# Patient Record
Sex: Female | Born: 1940
Health system: Southern US, Community
[De-identification: ages and names within clinical notes are randomized; demographics above are authoritative.]

## PROBLEM LIST (undated history)

## (undated) DIAGNOSIS — I10 Essential (primary) hypertension: Secondary | ICD-10-CM

## (undated) DIAGNOSIS — J381 Polyp of vocal cord and larynx: Secondary | ICD-10-CM

## (undated) DIAGNOSIS — C50919 Malignant neoplasm of unspecified site of unspecified female breast: Secondary | ICD-10-CM

## (undated) DIAGNOSIS — I509 Heart failure, unspecified: Secondary | ICD-10-CM

## (undated) DIAGNOSIS — Z9981 Dependence on supplemental oxygen: Secondary | ICD-10-CM

## (undated) DIAGNOSIS — G629 Polyneuropathy, unspecified: Secondary | ICD-10-CM

## (undated) DIAGNOSIS — R42 Dizziness and giddiness: Secondary | ICD-10-CM

## (undated) DIAGNOSIS — J449 Chronic obstructive pulmonary disease, unspecified: Secondary | ICD-10-CM

## (undated) DIAGNOSIS — R260 Ataxic gait: Secondary | ICD-10-CM

## (undated) DIAGNOSIS — J45909 Unspecified asthma, uncomplicated: Secondary | ICD-10-CM

## (undated) DIAGNOSIS — J329 Chronic sinusitis, unspecified: Secondary | ICD-10-CM

## (undated) DIAGNOSIS — R2689 Other abnormalities of gait and mobility: Secondary | ICD-10-CM

## (undated) DIAGNOSIS — C801 Malignant (primary) neoplasm, unspecified: Secondary | ICD-10-CM

## (undated) DIAGNOSIS — M797 Fibromyalgia: Secondary | ICD-10-CM

## (undated) DIAGNOSIS — N390 Urinary tract infection, site not specified: Secondary | ICD-10-CM

## (undated) DIAGNOSIS — M069 Rheumatoid arthritis, unspecified: Secondary | ICD-10-CM

## (undated) DIAGNOSIS — Z87891 Personal history of nicotine dependence: Principal | ICD-10-CM

## (undated) HISTORY — PX: EYE MUSCLE SURGERY: SHX370

## (undated) HISTORY — PX: ABDOMINAL HYSTERECTOMY: SHX81

## (undated) HISTORY — DX: Personal history of nicotine dependence: Z87.891

## (undated) HISTORY — PX: THUMB ARTHROSCOPY: SHX2509

## (undated) SURGERY — BRONCHOSCOPY, WITH FLUOROSCOPY
Anesthesia: Moderate Sedation | Laterality: Left

---

## 2004-02-08 ENCOUNTER — Ambulatory Visit: Payer: Self-pay | Admitting: Psychiatry

## 2004-05-08 ENCOUNTER — Ambulatory Visit: Payer: Self-pay | Admitting: Psychiatry

## 2005-03-03 ENCOUNTER — Other Ambulatory Visit: Payer: Self-pay

## 2005-03-03 ENCOUNTER — Ambulatory Visit: Payer: Self-pay | Admitting: Radiation Oncology

## 2005-03-05 ENCOUNTER — Ambulatory Visit: Payer: Self-pay | Admitting: General Surgery

## 2005-03-20 ENCOUNTER — Ambulatory Visit: Payer: Self-pay | Admitting: Oncology

## 2005-04-07 ENCOUNTER — Other Ambulatory Visit: Payer: Self-pay

## 2005-04-13 ENCOUNTER — Ambulatory Visit: Payer: Self-pay | Admitting: Oncology

## 2005-05-13 ENCOUNTER — Ambulatory Visit: Payer: Self-pay | Admitting: Oncology

## 2005-05-31 ENCOUNTER — Emergency Department: Payer: Self-pay | Admitting: Unknown Physician Specialty

## 2005-06-13 ENCOUNTER — Ambulatory Visit: Payer: Self-pay | Admitting: Oncology

## 2005-06-30 ENCOUNTER — Inpatient Hospital Stay: Payer: Self-pay | Admitting: Oncology

## 2005-07-13 ENCOUNTER — Ambulatory Visit: Payer: Self-pay | Admitting: Oncology

## 2005-08-13 ENCOUNTER — Ambulatory Visit: Payer: Self-pay | Admitting: Oncology

## 2005-09-13 ENCOUNTER — Ambulatory Visit: Payer: Self-pay | Admitting: Oncology

## 2005-10-24 ENCOUNTER — Ambulatory Visit: Payer: Self-pay | Admitting: Oncology

## 2005-11-13 ENCOUNTER — Ambulatory Visit: Payer: Self-pay | Admitting: Oncology

## 2006-01-22 ENCOUNTER — Ambulatory Visit: Payer: Self-pay | Admitting: Oncology

## 2006-02-13 ENCOUNTER — Ambulatory Visit: Payer: Self-pay | Admitting: Oncology

## 2006-04-14 ENCOUNTER — Ambulatory Visit: Payer: Self-pay | Admitting: Oncology

## 2006-04-22 ENCOUNTER — Ambulatory Visit: Payer: Self-pay | Admitting: Oncology

## 2006-05-14 ENCOUNTER — Ambulatory Visit: Payer: Self-pay | Admitting: Oncology

## 2006-07-14 ENCOUNTER — Ambulatory Visit: Payer: Self-pay | Admitting: Oncology

## 2006-07-30 ENCOUNTER — Ambulatory Visit: Payer: Self-pay | Admitting: Oncology

## 2006-08-14 ENCOUNTER — Ambulatory Visit: Payer: Self-pay | Admitting: Oncology

## 2006-10-14 ENCOUNTER — Ambulatory Visit: Payer: Self-pay | Admitting: Oncology

## 2006-11-05 ENCOUNTER — Ambulatory Visit: Payer: Self-pay | Admitting: Oncology

## 2006-11-14 ENCOUNTER — Ambulatory Visit: Payer: Self-pay | Admitting: Oncology

## 2007-01-14 ENCOUNTER — Ambulatory Visit: Payer: Self-pay | Admitting: Oncology

## 2007-01-14 DIAGNOSIS — C50919 Malignant neoplasm of unspecified site of unspecified female breast: Secondary | ICD-10-CM

## 2007-01-14 HISTORY — PX: BREAST LUMPECTOMY: SHX2

## 2007-01-14 HISTORY — DX: Malignant neoplasm of unspecified site of unspecified female breast: C50.919

## 2007-02-04 ENCOUNTER — Ambulatory Visit: Payer: Self-pay | Admitting: Oncology

## 2007-02-14 ENCOUNTER — Ambulatory Visit: Payer: Self-pay | Admitting: Oncology

## 2007-03-14 ENCOUNTER — Ambulatory Visit: Payer: Self-pay | Admitting: Oncology

## 2007-04-14 ENCOUNTER — Ambulatory Visit: Payer: Self-pay | Admitting: Oncology

## 2007-05-06 ENCOUNTER — Ambulatory Visit: Payer: Self-pay | Admitting: Oncology

## 2007-05-14 ENCOUNTER — Ambulatory Visit: Payer: Self-pay | Admitting: Oncology

## 2007-05-26 ENCOUNTER — Emergency Department: Payer: Self-pay | Admitting: Emergency Medicine

## 2007-06-10 ENCOUNTER — Encounter: Admission: RE | Admit: 2007-06-10 | Discharge: 2007-06-10 | Payer: Self-pay | Admitting: Neurosurgery

## 2007-06-28 ENCOUNTER — Ambulatory Visit: Payer: Self-pay | Admitting: Otolaryngology

## 2007-07-13 ENCOUNTER — Ambulatory Visit: Payer: Self-pay | Admitting: Otolaryngology

## 2007-07-21 ENCOUNTER — Ambulatory Visit: Payer: Self-pay | Admitting: Otolaryngology

## 2007-08-14 ENCOUNTER — Ambulatory Visit: Payer: Self-pay | Admitting: Oncology

## 2007-08-17 ENCOUNTER — Ambulatory Visit: Payer: Self-pay | Admitting: Neurosurgery

## 2007-09-06 ENCOUNTER — Ambulatory Visit: Payer: Self-pay | Admitting: Oncology

## 2007-09-14 ENCOUNTER — Ambulatory Visit: Payer: Self-pay | Admitting: Oncology

## 2007-09-24 ENCOUNTER — Ambulatory Visit: Payer: Self-pay | Admitting: General Surgery

## 2007-10-07 ENCOUNTER — Ambulatory Visit: Payer: Self-pay | Admitting: Otolaryngology

## 2007-10-14 ENCOUNTER — Ambulatory Visit: Payer: Self-pay | Admitting: Otolaryngology

## 2007-12-14 ENCOUNTER — Ambulatory Visit: Payer: Self-pay | Admitting: Oncology

## 2007-12-24 ENCOUNTER — Ambulatory Visit: Payer: Self-pay | Admitting: Oncology

## 2008-01-14 ENCOUNTER — Ambulatory Visit: Payer: Self-pay | Admitting: Oncology

## 2008-05-10 ENCOUNTER — Ambulatory Visit: Payer: Self-pay | Admitting: Internal Medicine

## 2008-05-15 ENCOUNTER — Ambulatory Visit: Payer: Self-pay | Admitting: Internal Medicine

## 2008-06-13 ENCOUNTER — Ambulatory Visit: Payer: Self-pay | Admitting: Oncology

## 2008-06-28 ENCOUNTER — Ambulatory Visit: Payer: Self-pay | Admitting: Oncology

## 2008-07-13 ENCOUNTER — Ambulatory Visit: Payer: Self-pay | Admitting: Oncology

## 2008-12-18 ENCOUNTER — Ambulatory Visit: Payer: Self-pay | Admitting: Unknown Physician Specialty

## 2009-01-13 ENCOUNTER — Ambulatory Visit: Payer: Self-pay | Admitting: Oncology

## 2009-02-05 ENCOUNTER — Ambulatory Visit: Payer: Self-pay | Admitting: Oncology

## 2009-02-05 ENCOUNTER — Ambulatory Visit: Payer: Self-pay | Admitting: General Surgery

## 2009-02-13 ENCOUNTER — Ambulatory Visit: Payer: Self-pay | Admitting: Oncology

## 2009-06-16 ENCOUNTER — Emergency Department: Payer: Self-pay | Admitting: Emergency Medicine

## 2009-06-17 ENCOUNTER — Emergency Department: Payer: Self-pay | Admitting: Emergency Medicine

## 2009-07-13 ENCOUNTER — Ambulatory Visit: Payer: Self-pay | Admitting: Oncology

## 2009-08-10 ENCOUNTER — Ambulatory Visit: Payer: Self-pay | Admitting: Oncology

## 2009-08-12 LAB — CANCER ANTIGEN 27.29: CA 27.29: 31.3 U/mL (ref 0.0–38.6)

## 2009-08-13 ENCOUNTER — Ambulatory Visit: Payer: Self-pay | Admitting: Oncology

## 2010-02-08 ENCOUNTER — Ambulatory Visit: Payer: Self-pay | Admitting: Oncology

## 2010-02-09 LAB — CANCER ANTIGEN 27.29: CA 27.29: 34.9 U/mL (ref 0.0–38.6)

## 2010-02-13 ENCOUNTER — Ambulatory Visit: Payer: Self-pay | Admitting: Oncology

## 2010-07-15 ENCOUNTER — Ambulatory Visit: Payer: Self-pay | Admitting: Internal Medicine

## 2010-08-09 ENCOUNTER — Ambulatory Visit: Payer: Self-pay | Admitting: Oncology

## 2010-08-14 ENCOUNTER — Ambulatory Visit: Payer: Self-pay | Admitting: Oncology

## 2011-02-06 ENCOUNTER — Ambulatory Visit: Payer: Self-pay | Admitting: Oncology

## 2011-02-06 LAB — CBC CANCER CENTER
Basophil %: 1.5 %
HGB: 14 g/dL (ref 12.0–16.0)
MCH: 31.7 pg (ref 26.0–34.0)
MCHC: 33.9 g/dL (ref 32.0–36.0)
Monocyte #: 0.3 x10 3/mm (ref 0.0–0.7)
Neutrophil #: 3 x10 3/mm (ref 1.4–6.5)
Neutrophil %: 59.9 %

## 2011-02-06 LAB — COMPREHENSIVE METABOLIC PANEL
Anion Gap: 9 (ref 7–16)
BUN: 5 mg/dL — ABNORMAL LOW (ref 7–18)
Bilirubin,Total: 0.3 mg/dL (ref 0.2–1.0)
Chloride: 99 mmol/L (ref 98–107)
Co2: 29 mmol/L (ref 21–32)
Creatinine: 0.74 mg/dL (ref 0.60–1.30)
EGFR (African American): >60
Potassium: 4 mmol/L (ref 3.5–5.1)
SGPT (ALT): 18 U/L
Sodium: 137 mmol/L (ref 136–145)
Total Protein: 8.2 g/dL (ref 6.4–8.2)

## 2011-02-07 LAB — CANCER ANTIGEN 27.29: CA 27.29: 32.3 U/mL (ref 0.0–38.6)

## 2011-02-14 ENCOUNTER — Ambulatory Visit: Payer: Self-pay | Admitting: Oncology

## 2011-04-28 ENCOUNTER — Ambulatory Visit: Payer: Self-pay | Admitting: Rheumatology

## 2011-06-17 DIAGNOSIS — M1990 Unspecified osteoarthritis, unspecified site: Secondary | ICD-10-CM | POA: Insufficient documentation

## 2011-08-04 ENCOUNTER — Ambulatory Visit: Payer: Self-pay | Admitting: Oncology

## 2011-08-04 LAB — COMPREHENSIVE METABOLIC PANEL
Albumin: 3.7 g/dL (ref 3.4–5.0)
Alkaline Phosphatase: 102 U/L (ref 50–136)
Bilirubin,Total: 0.2 mg/dL (ref 0.2–1.0)
Co2: 30 mmol/L (ref 21–32)
EGFR (Non-African Amer.): >60
Glucose: 102 mg/dL — ABNORMAL HIGH (ref 65–99)
Osmolality: 270 (ref 275–301)
SGPT (ALT): 16 U/L
Sodium: 136 mmol/L (ref 136–145)

## 2011-08-04 LAB — CBC CANCER CENTER
Eosinophil %: 4.2 %
HCT: 41.6 % (ref 35.0–47.0)
Lymphocyte #: 1.8 x10 3/mm (ref 1.0–3.6)
Lymphocyte %: 35.4 %
MCV: 94 fL (ref 80–100)
Monocyte %: 6.7 %
Platelet: 168 x10 3/mm (ref 150–440)
RBC: 4.42 10*6/uL (ref 3.80–5.20)
WBC: 5 x10 3/mm (ref 3.6–11.0)

## 2011-08-05 LAB — CANCER ANTIGEN 27.29: CA 27.29: 33.1 U/mL (ref 0.0–38.6)

## 2011-08-14 ENCOUNTER — Ambulatory Visit: Payer: Self-pay | Admitting: Oncology

## 2011-11-03 DIAGNOSIS — J45909 Unspecified asthma, uncomplicated: Secondary | ICD-10-CM | POA: Insufficient documentation

## 2011-11-03 DIAGNOSIS — C50919 Malignant neoplasm of unspecified site of unspecified female breast: Secondary | ICD-10-CM | POA: Insufficient documentation

## 2011-11-03 DIAGNOSIS — M1990 Unspecified osteoarthritis, unspecified site: Secondary | ICD-10-CM | POA: Insufficient documentation

## 2011-11-18 ENCOUNTER — Ambulatory Visit: Payer: Self-pay | Admitting: Specialist

## 2011-11-26 ENCOUNTER — Ambulatory Visit: Payer: Self-pay | Admitting: Specialist

## 2012-01-14 ENCOUNTER — Ambulatory Visit: Payer: Self-pay | Admitting: Oncology

## 2012-02-12 ENCOUNTER — Ambulatory Visit: Payer: Self-pay | Admitting: Oncology

## 2012-02-14 ENCOUNTER — Ambulatory Visit: Payer: Self-pay | Admitting: Oncology

## 2012-02-16 ENCOUNTER — Ambulatory Visit: Payer: Self-pay

## 2012-02-16 LAB — CBC CANCER CENTER
Lymphocyte %: 30.8 %
MCH: 31.4 pg (ref 26.0–34.0)
MCHC: 34.3 g/dL (ref 32.0–36.0)
Monocyte #: 0.3 x10 3/mm (ref 0.2–0.9)
Monocyte %: 6.3 %
RBC: 3.99 10*6/uL (ref 3.80–5.20)
RDW: 13.4 % (ref 11.5–14.5)
WBC: 5.3 x10 3/mm (ref 3.6–11.0)

## 2012-02-16 LAB — COMPREHENSIVE METABOLIC PANEL
Alkaline Phosphatase: 115 U/L (ref 50–136)
Bilirubin,Total: 0.2 mg/dL (ref 0.2–1.0)
EGFR (Non-African Amer.): >60
Glucose: 143 mg/dL — ABNORMAL HIGH (ref 65–99)
Osmolality: 273 (ref 275–301)
Potassium: 4 mmol/L (ref 3.5–5.1)
SGOT(AST): 22 U/L (ref 15–37)
Total Protein: 7.3 g/dL (ref 6.4–8.2)

## 2012-02-17 LAB — CANCER ANTIGEN 27.29: CA 27.29: 26.6 U/mL (ref 0.0–38.6)

## 2012-03-13 ENCOUNTER — Ambulatory Visit: Payer: Self-pay | Admitting: Oncology

## 2012-05-11 DIAGNOSIS — M545 Low back pain, unspecified: Secondary | ICD-10-CM | POA: Insufficient documentation

## 2012-05-11 DIAGNOSIS — M797 Fibromyalgia: Secondary | ICD-10-CM | POA: Insufficient documentation

## 2012-06-04 ENCOUNTER — Telehealth: Payer: Self-pay | Admitting: *Deleted

## 2012-06-04 NOTE — Telephone Encounter (Signed)
Patient called stating she needs a prescription for 4 bras sent to [facility]. She reports she is now a DD and has outgrown her others. Patient states it has been five years since she has gotten them.

## 2012-06-17 ENCOUNTER — Ambulatory Visit: Payer: Self-pay

## 2012-10-11 ENCOUNTER — Inpatient Hospital Stay: Payer: Self-pay | Admitting: Internal Medicine

## 2012-10-11 LAB — CBC
HCT: 34.2 % — ABNORMAL LOW (ref 35.0–47.0)
MCH: 31.4 pg (ref 26.0–34.0)
RDW: 14.3 % (ref 11.5–14.5)

## 2012-10-11 LAB — URINALYSIS, COMPLETE
Bilirubin,UR: NEGATIVE
Nitrite: POSITIVE
Ph: 5 (ref 4.5–8.0)
Protein: 30
Transitional Epi: 1
WBC UR: 453 /HPF (ref 0–5)

## 2012-10-11 LAB — DIFFERENTIAL
Basophil #: 0 10*3/uL (ref 0.0–0.1)
Basophil %: 0.5 %
Eosinophil %: 0.3 %
Lymphocyte #: 1.6 10*3/uL (ref 1.0–3.6)
Neutrophil #: 6.8 10*3/uL — ABNORMAL HIGH (ref 1.4–6.5)

## 2012-10-11 LAB — COMPREHENSIVE METABOLIC PANEL
Albumin: 3.1 g/dL — ABNORMAL LOW (ref 3.4–5.0)
Alkaline Phosphatase: 116 U/L (ref 50–136)
Co2: 28 mmol/L (ref 21–32)
EGFR (Non-African Amer.): 50 — ABNORMAL LOW
Glucose: 115 mg/dL — ABNORMAL HIGH (ref 65–99)
Osmolality: 261 (ref 275–301)

## 2012-10-12 LAB — BASIC METABOLIC PANEL
Calcium, Total: 8.8 mg/dL (ref 8.5–10.1)
Creatinine: 0.91 mg/dL (ref 0.60–1.30)
EGFR (African American): >60
EGFR (Non-African Amer.): >60

## 2012-10-14 LAB — URINE CULTURE

## 2012-10-16 LAB — CULTURE, BLOOD (SINGLE)

## 2012-11-30 ENCOUNTER — Ambulatory Visit: Payer: Self-pay | Admitting: Vascular Surgery

## 2013-01-17 DIAGNOSIS — F1190 Opioid use, unspecified, uncomplicated: Secondary | ICD-10-CM | POA: Insufficient documentation

## 2013-02-17 ENCOUNTER — Ambulatory Visit: Payer: Self-pay | Admitting: Oncology

## 2013-03-08 ENCOUNTER — Ambulatory Visit: Payer: Self-pay | Admitting: Oncology

## 2013-03-09 LAB — CBC CANCER CENTER
BASOS ABS: 0.1 x10 3/mm (ref 0.0–0.1)
Basophil %: 1.2 %
EOS ABS: 0.2 x10 3/mm (ref 0.0–0.7)
Eosinophil %: 2.5 %
HCT: 42.4 % (ref 35.0–47.0)
HGB: 14 g/dL (ref 12.0–16.0)
LYMPHS ABS: 1.9 x10 3/mm (ref 1.0–3.6)
Lymphocyte %: 30.1 %
MCH: 31.2 pg (ref 26.0–34.0)
MCHC: 33.1 g/dL (ref 32.0–36.0)
MCV: 94 fL (ref 80–100)
MONOS PCT: 6.1 %
Monocyte #: 0.4 x10 3/mm (ref 0.2–0.9)
NEUTROS ABS: 3.7 x10 3/mm (ref 1.4–6.5)
NEUTROS PCT: 60.1 %
PLATELETS: 165 x10 3/mm (ref 150–440)
RBC: 4.49 10*6/uL (ref 3.80–5.20)
RDW: 13.4 % (ref 11.5–14.5)
WBC: 6.2 x10 3/mm (ref 3.6–11.0)

## 2013-03-09 LAB — COMPREHENSIVE METABOLIC PANEL
ALBUMIN: 3.8 g/dL (ref 3.4–5.0)
Alkaline Phosphatase: 125 U/L — ABNORMAL HIGH
Anion Gap: 9 (ref 7–16)
BILIRUBIN TOTAL: 0.3 mg/dL (ref 0.2–1.0)
BUN: 7 mg/dL (ref 7–18)
CHLORIDE: 97 mmol/L — AB (ref 98–107)
CREATININE: 1.03 mg/dL (ref 0.60–1.30)
Calcium, Total: 8.6 mg/dL (ref 8.5–10.1)
Co2: 29 mmol/L (ref 21–32)
EGFR (Non-African Amer.): 55 — ABNORMAL LOW
GLUCOSE: 140 mg/dL — AB (ref 65–99)
OSMOLALITY: 270 (ref 275–301)
POTASSIUM: 3.9 mmol/L (ref 3.5–5.1)
SGOT(AST): 19 U/L (ref 15–37)
SGPT (ALT): 18 U/L (ref 12–78)
SODIUM: 135 mmol/L — AB (ref 136–145)
TOTAL PROTEIN: 8 g/dL (ref 6.4–8.2)

## 2013-03-11 LAB — CANCER ANTIGEN 27.29: CA 27.29: 34.9 U/mL (ref 0.0–38.6)

## 2013-03-13 ENCOUNTER — Ambulatory Visit: Payer: Self-pay | Admitting: Oncology

## 2013-05-24 ENCOUNTER — Ambulatory Visit: Payer: Self-pay | Admitting: Vascular Surgery

## 2013-06-21 ENCOUNTER — Ambulatory Visit: Payer: Self-pay | Admitting: Pain Medicine

## 2013-08-16 ENCOUNTER — Ambulatory Visit: Payer: Self-pay | Admitting: Pain Medicine

## 2013-09-13 ENCOUNTER — Ambulatory Visit: Payer: Self-pay | Admitting: Pain Medicine

## 2013-12-02 ENCOUNTER — Ambulatory Visit: Payer: Self-pay | Admitting: Vascular Surgery

## 2014-01-27 DIAGNOSIS — J381 Polyp of vocal cord and larynx: Secondary | ICD-10-CM | POA: Diagnosis not present

## 2014-01-27 DIAGNOSIS — Z6822 Body mass index (BMI) 22.0-22.9, adult: Secondary | ICD-10-CM | POA: Diagnosis not present

## 2014-01-27 DIAGNOSIS — C4409 Other specified malignant neoplasm of skin of lip: Secondary | ICD-10-CM | POA: Diagnosis not present

## 2014-01-27 DIAGNOSIS — T65294D Toxic effect of other tobacco and nicotine, undetermined, subsequent encounter: Secondary | ICD-10-CM | POA: Diagnosis not present

## 2014-01-27 DIAGNOSIS — J387 Other diseases of larynx: Secondary | ICD-10-CM | POA: Diagnosis not present

## 2014-01-27 DIAGNOSIS — Z716 Tobacco abuse counseling: Secondary | ICD-10-CM | POA: Diagnosis not present

## 2014-01-27 DIAGNOSIS — L570 Actinic keratosis: Secondary | ICD-10-CM | POA: Diagnosis not present

## 2014-02-13 DIAGNOSIS — E780 Pure hypercholesterolemia: Secondary | ICD-10-CM | POA: Diagnosis not present

## 2014-02-13 DIAGNOSIS — J381 Polyp of vocal cord and larynx: Secondary | ICD-10-CM | POA: Insufficient documentation

## 2014-02-13 DIAGNOSIS — R0602 Shortness of breath: Secondary | ICD-10-CM | POA: Diagnosis not present

## 2014-02-13 DIAGNOSIS — M150 Primary generalized (osteo)arthritis: Secondary | ICD-10-CM | POA: Diagnosis not present

## 2014-02-13 DIAGNOSIS — M797 Fibromyalgia: Secondary | ICD-10-CM | POA: Diagnosis not present

## 2014-02-24 DIAGNOSIS — R918 Other nonspecific abnormal finding of lung field: Secondary | ICD-10-CM | POA: Diagnosis not present

## 2014-02-24 DIAGNOSIS — I714 Abdominal aortic aneurysm, without rupture, unspecified: Secondary | ICD-10-CM | POA: Insufficient documentation

## 2014-02-24 DIAGNOSIS — C50919 Malignant neoplasm of unspecified site of unspecified female breast: Secondary | ICD-10-CM | POA: Diagnosis not present

## 2014-02-24 DIAGNOSIS — E782 Mixed hyperlipidemia: Secondary | ICD-10-CM | POA: Diagnosis not present

## 2014-02-24 DIAGNOSIS — I209 Angina pectoris, unspecified: Secondary | ICD-10-CM | POA: Diagnosis not present

## 2014-02-24 DIAGNOSIS — R042 Hemoptysis: Secondary | ICD-10-CM | POA: Diagnosis not present

## 2014-02-24 DIAGNOSIS — R0602 Shortness of breath: Secondary | ICD-10-CM | POA: Diagnosis not present

## 2014-02-25 DIAGNOSIS — I2510 Atherosclerotic heart disease of native coronary artery without angina pectoris: Secondary | ICD-10-CM | POA: Insufficient documentation

## 2014-03-01 ENCOUNTER — Ambulatory Visit: Payer: Self-pay | Admitting: Oncology

## 2014-03-06 DIAGNOSIS — J381 Polyp of vocal cord and larynx: Secondary | ICD-10-CM | POA: Diagnosis not present

## 2014-03-06 DIAGNOSIS — J383 Other diseases of vocal cords: Secondary | ICD-10-CM | POA: Diagnosis not present

## 2014-03-06 DIAGNOSIS — R490 Dysphonia: Secondary | ICD-10-CM | POA: Diagnosis not present

## 2014-03-08 DIAGNOSIS — M1990 Unspecified osteoarthritis, unspecified site: Secondary | ICD-10-CM | POA: Diagnosis not present

## 2014-03-08 DIAGNOSIS — Z170 Estrogen receptor positive status [ER+]: Secondary | ICD-10-CM | POA: Diagnosis not present

## 2014-03-08 DIAGNOSIS — Z853 Personal history of malignant neoplasm of breast: Secondary | ICD-10-CM | POA: Diagnosis not present

## 2014-03-08 DIAGNOSIS — M549 Dorsalgia, unspecified: Secondary | ICD-10-CM | POA: Diagnosis not present

## 2014-03-08 DIAGNOSIS — D130 Benign neoplasm of esophagus: Secondary | ICD-10-CM | POA: Diagnosis not present

## 2014-03-08 DIAGNOSIS — M797 Fibromyalgia: Secondary | ICD-10-CM | POA: Diagnosis not present

## 2014-03-08 DIAGNOSIS — Z9223 Personal history of estrogen therapy: Secondary | ICD-10-CM | POA: Diagnosis not present

## 2014-03-08 DIAGNOSIS — I719 Aortic aneurysm of unspecified site, without rupture: Secondary | ICD-10-CM | POA: Diagnosis not present

## 2014-03-08 DIAGNOSIS — R300 Dysuria: Secondary | ICD-10-CM | POA: Diagnosis not present

## 2014-03-09 DIAGNOSIS — I209 Angina pectoris, unspecified: Secondary | ICD-10-CM | POA: Diagnosis not present

## 2014-03-10 DIAGNOSIS — R9439 Abnormal result of other cardiovascular function study: Secondary | ICD-10-CM | POA: Insufficient documentation

## 2014-03-10 DIAGNOSIS — E782 Mixed hyperlipidemia: Secondary | ICD-10-CM | POA: Diagnosis not present

## 2014-03-10 DIAGNOSIS — I25119 Atherosclerotic heart disease of native coronary artery with unspecified angina pectoris: Secondary | ICD-10-CM | POA: Diagnosis not present

## 2014-03-14 ENCOUNTER — Ambulatory Visit
Admit: 2014-03-14 | Disposition: A | Discharge status: Home / Self Care | Payer: Self-pay | Attending: Oncology | Admitting: Oncology

## 2014-03-15 ENCOUNTER — Ambulatory Visit: Payer: Self-pay | Admitting: Internal Medicine

## 2014-03-15 DIAGNOSIS — E785 Hyperlipidemia, unspecified: Secondary | ICD-10-CM | POA: Diagnosis not present

## 2014-03-15 DIAGNOSIS — R9439 Abnormal result of other cardiovascular function study: Secondary | ICD-10-CM | POA: Diagnosis not present

## 2014-03-15 DIAGNOSIS — J439 Emphysema, unspecified: Secondary | ICD-10-CM | POA: Diagnosis not present

## 2014-03-15 DIAGNOSIS — I10 Essential (primary) hypertension: Secondary | ICD-10-CM | POA: Diagnosis not present

## 2014-03-15 DIAGNOSIS — M1990 Unspecified osteoarthritis, unspecified site: Secondary | ICD-10-CM | POA: Diagnosis not present

## 2014-03-15 DIAGNOSIS — I5021 Acute systolic (congestive) heart failure: Secondary | ICD-10-CM | POA: Diagnosis not present

## 2014-03-15 DIAGNOSIS — M797 Fibromyalgia: Secondary | ICD-10-CM | POA: Diagnosis not present

## 2014-03-15 DIAGNOSIS — I25119 Atherosclerotic heart disease of native coronary artery with unspecified angina pectoris: Secondary | ICD-10-CM | POA: Diagnosis not present

## 2014-03-15 DIAGNOSIS — R0602 Shortness of breath: Secondary | ICD-10-CM | POA: Diagnosis not present

## 2014-03-15 DIAGNOSIS — Z720 Tobacco use: Secondary | ICD-10-CM | POA: Diagnosis not present

## 2014-03-17 DIAGNOSIS — E782 Mixed hyperlipidemia: Secondary | ICD-10-CM | POA: Diagnosis not present

## 2014-03-17 DIAGNOSIS — I2510 Atherosclerotic heart disease of native coronary artery without angina pectoris: Secondary | ICD-10-CM | POA: Diagnosis not present

## 2014-03-17 DIAGNOSIS — R0602 Shortness of breath: Secondary | ICD-10-CM | POA: Diagnosis not present

## 2014-03-17 DIAGNOSIS — I5022 Chronic systolic (congestive) heart failure: Secondary | ICD-10-CM | POA: Diagnosis not present

## 2014-03-28 DIAGNOSIS — I714 Abdominal aortic aneurysm, without rupture: Secondary | ICD-10-CM | POA: Diagnosis not present

## 2014-03-28 DIAGNOSIS — I5022 Chronic systolic (congestive) heart failure: Secondary | ICD-10-CM | POA: Diagnosis not present

## 2014-03-28 DIAGNOSIS — R5382 Chronic fatigue, unspecified: Secondary | ICD-10-CM | POA: Diagnosis not present

## 2014-03-28 DIAGNOSIS — R062 Wheezing: Secondary | ICD-10-CM | POA: Diagnosis not present

## 2014-03-28 DIAGNOSIS — M797 Fibromyalgia: Secondary | ICD-10-CM | POA: Diagnosis not present

## 2014-03-28 DIAGNOSIS — Z87440 Personal history of urinary (tract) infections: Secondary | ICD-10-CM | POA: Diagnosis not present

## 2014-03-28 DIAGNOSIS — R0689 Other abnormalities of breathing: Secondary | ICD-10-CM | POA: Diagnosis not present

## 2014-03-28 DIAGNOSIS — I2510 Atherosclerotic heart disease of native coronary artery without angina pectoris: Secondary | ICD-10-CM | POA: Diagnosis not present

## 2014-03-28 DIAGNOSIS — J441 Chronic obstructive pulmonary disease with (acute) exacerbation: Secondary | ICD-10-CM | POA: Diagnosis not present

## 2014-03-28 DIAGNOSIS — R0602 Shortness of breath: Secondary | ICD-10-CM | POA: Diagnosis not present

## 2014-03-28 DIAGNOSIS — R000 Tachycardia, unspecified: Secondary | ICD-10-CM | POA: Diagnosis not present

## 2014-03-29 ENCOUNTER — Inpatient Hospital Stay: Payer: Self-pay | Admitting: Internal Medicine

## 2014-03-29 DIAGNOSIS — N390 Urinary tract infection, site not specified: Secondary | ICD-10-CM | POA: Diagnosis not present

## 2014-03-29 DIAGNOSIS — R748 Abnormal levels of other serum enzymes: Secondary | ICD-10-CM | POA: Diagnosis not present

## 2014-03-29 DIAGNOSIS — J9600 Acute respiratory failure, unspecified whether with hypoxia or hypercapnia: Secondary | ICD-10-CM | POA: Diagnosis not present

## 2014-03-29 DIAGNOSIS — R0602 Shortness of breath: Secondary | ICD-10-CM | POA: Diagnosis not present

## 2014-03-29 DIAGNOSIS — I270 Primary pulmonary hypertension: Secondary | ICD-10-CM | POA: Diagnosis not present

## 2014-03-29 DIAGNOSIS — I420 Dilated cardiomyopathy: Secondary | ICD-10-CM | POA: Diagnosis not present

## 2014-03-29 DIAGNOSIS — I5022 Chronic systolic (congestive) heart failure: Secondary | ICD-10-CM | POA: Diagnosis not present

## 2014-03-29 DIAGNOSIS — R0789 Other chest pain: Secondary | ICD-10-CM | POA: Diagnosis not present

## 2014-03-29 DIAGNOSIS — I5023 Acute on chronic systolic (congestive) heart failure: Secondary | ICD-10-CM | POA: Diagnosis not present

## 2014-03-29 DIAGNOSIS — J441 Chronic obstructive pulmonary disease with (acute) exacerbation: Secondary | ICD-10-CM | POA: Diagnosis not present

## 2014-03-29 DIAGNOSIS — J449 Chronic obstructive pulmonary disease, unspecified: Secondary | ICD-10-CM | POA: Diagnosis not present

## 2014-03-29 DIAGNOSIS — N179 Acute kidney failure, unspecified: Secondary | ICD-10-CM | POA: Diagnosis not present

## 2014-03-29 DIAGNOSIS — J9622 Acute and chronic respiratory failure with hypercapnia: Secondary | ICD-10-CM | POA: Diagnosis not present

## 2014-03-29 DIAGNOSIS — Z8711 Personal history of peptic ulcer disease: Secondary | ICD-10-CM | POA: Diagnosis not present

## 2014-03-29 DIAGNOSIS — I25110 Atherosclerotic heart disease of native coronary artery with unstable angina pectoris: Secondary | ICD-10-CM | POA: Diagnosis not present

## 2014-03-29 DIAGNOSIS — E872 Acidosis: Secondary | ICD-10-CM | POA: Diagnosis not present

## 2014-03-29 DIAGNOSIS — I272 Other secondary pulmonary hypertension: Secondary | ICD-10-CM | POA: Diagnosis not present

## 2014-03-29 DIAGNOSIS — I509 Heart failure, unspecified: Secondary | ICD-10-CM | POA: Diagnosis not present

## 2014-03-31 DIAGNOSIS — M1990 Unspecified osteoarthritis, unspecified site: Secondary | ICD-10-CM | POA: Diagnosis not present

## 2014-03-31 DIAGNOSIS — J441 Chronic obstructive pulmonary disease with (acute) exacerbation: Secondary | ICD-10-CM | POA: Diagnosis not present

## 2014-03-31 DIAGNOSIS — I5020 Unspecified systolic (congestive) heart failure: Secondary | ICD-10-CM | POA: Diagnosis not present

## 2014-03-31 DIAGNOSIS — I2510 Atherosclerotic heart disease of native coronary artery without angina pectoris: Secondary | ICD-10-CM | POA: Diagnosis not present

## 2014-03-31 DIAGNOSIS — J9622 Acute and chronic respiratory failure with hypercapnia: Secondary | ICD-10-CM | POA: Diagnosis not present

## 2014-03-31 DIAGNOSIS — M797 Fibromyalgia: Secondary | ICD-10-CM | POA: Diagnosis not present

## 2014-03-31 DIAGNOSIS — I272 Other secondary pulmonary hypertension: Secondary | ICD-10-CM | POA: Diagnosis not present

## 2014-03-31 DIAGNOSIS — I10 Essential (primary) hypertension: Secondary | ICD-10-CM | POA: Diagnosis not present

## 2014-03-31 DIAGNOSIS — Z9981 Dependence on supplemental oxygen: Secondary | ICD-10-CM | POA: Diagnosis not present

## 2014-04-04 DIAGNOSIS — J9622 Acute and chronic respiratory failure with hypercapnia: Secondary | ICD-10-CM | POA: Diagnosis not present

## 2014-04-04 DIAGNOSIS — J441 Chronic obstructive pulmonary disease with (acute) exacerbation: Secondary | ICD-10-CM | POA: Diagnosis not present

## 2014-04-04 DIAGNOSIS — I5020 Unspecified systolic (congestive) heart failure: Secondary | ICD-10-CM | POA: Diagnosis not present

## 2014-04-04 DIAGNOSIS — I272 Other secondary pulmonary hypertension: Secondary | ICD-10-CM | POA: Diagnosis not present

## 2014-04-04 DIAGNOSIS — M1990 Unspecified osteoarthritis, unspecified site: Secondary | ICD-10-CM | POA: Diagnosis not present

## 2014-04-04 DIAGNOSIS — I2510 Atherosclerotic heart disease of native coronary artery without angina pectoris: Secondary | ICD-10-CM | POA: Diagnosis not present

## 2014-04-04 DIAGNOSIS — Z9981 Dependence on supplemental oxygen: Secondary | ICD-10-CM | POA: Diagnosis not present

## 2014-04-04 DIAGNOSIS — M797 Fibromyalgia: Secondary | ICD-10-CM | POA: Diagnosis not present

## 2014-04-04 DIAGNOSIS — I10 Essential (primary) hypertension: Secondary | ICD-10-CM | POA: Diagnosis not present

## 2014-04-06 DIAGNOSIS — M1990 Unspecified osteoarthritis, unspecified site: Secondary | ICD-10-CM | POA: Diagnosis not present

## 2014-04-06 DIAGNOSIS — J441 Chronic obstructive pulmonary disease with (acute) exacerbation: Secondary | ICD-10-CM | POA: Diagnosis not present

## 2014-04-06 DIAGNOSIS — J9622 Acute and chronic respiratory failure with hypercapnia: Secondary | ICD-10-CM | POA: Diagnosis not present

## 2014-04-06 DIAGNOSIS — Z9981 Dependence on supplemental oxygen: Secondary | ICD-10-CM | POA: Diagnosis not present

## 2014-04-06 DIAGNOSIS — I2510 Atherosclerotic heart disease of native coronary artery without angina pectoris: Secondary | ICD-10-CM | POA: Diagnosis not present

## 2014-04-06 DIAGNOSIS — M797 Fibromyalgia: Secondary | ICD-10-CM | POA: Diagnosis not present

## 2014-04-06 DIAGNOSIS — I5020 Unspecified systolic (congestive) heart failure: Secondary | ICD-10-CM | POA: Diagnosis not present

## 2014-04-06 DIAGNOSIS — I10 Essential (primary) hypertension: Secondary | ICD-10-CM | POA: Diagnosis not present

## 2014-04-06 DIAGNOSIS — I272 Other secondary pulmonary hypertension: Secondary | ICD-10-CM | POA: Diagnosis not present

## 2014-04-10 DIAGNOSIS — I5020 Unspecified systolic (congestive) heart failure: Secondary | ICD-10-CM | POA: Diagnosis not present

## 2014-04-10 DIAGNOSIS — I10 Essential (primary) hypertension: Secondary | ICD-10-CM | POA: Diagnosis not present

## 2014-04-10 DIAGNOSIS — I272 Other secondary pulmonary hypertension: Secondary | ICD-10-CM | POA: Diagnosis not present

## 2014-04-10 DIAGNOSIS — J441 Chronic obstructive pulmonary disease with (acute) exacerbation: Secondary | ICD-10-CM | POA: Diagnosis not present

## 2014-04-10 DIAGNOSIS — M1990 Unspecified osteoarthritis, unspecified site: Secondary | ICD-10-CM | POA: Diagnosis not present

## 2014-04-10 DIAGNOSIS — M797 Fibromyalgia: Secondary | ICD-10-CM | POA: Diagnosis not present

## 2014-04-10 DIAGNOSIS — I2510 Atherosclerotic heart disease of native coronary artery without angina pectoris: Secondary | ICD-10-CM | POA: Diagnosis not present

## 2014-04-10 DIAGNOSIS — Z9981 Dependence on supplemental oxygen: Secondary | ICD-10-CM | POA: Diagnosis not present

## 2014-04-10 DIAGNOSIS — J9622 Acute and chronic respiratory failure with hypercapnia: Secondary | ICD-10-CM | POA: Diagnosis not present

## 2014-04-12 DIAGNOSIS — I2510 Atherosclerotic heart disease of native coronary artery without angina pectoris: Secondary | ICD-10-CM | POA: Diagnosis not present

## 2014-04-12 DIAGNOSIS — E782 Mixed hyperlipidemia: Secondary | ICD-10-CM | POA: Diagnosis not present

## 2014-04-12 DIAGNOSIS — I5022 Chronic systolic (congestive) heart failure: Secondary | ICD-10-CM | POA: Diagnosis not present

## 2014-04-12 DIAGNOSIS — R0602 Shortness of breath: Secondary | ICD-10-CM | POA: Diagnosis not present

## 2014-04-13 DIAGNOSIS — J441 Chronic obstructive pulmonary disease with (acute) exacerbation: Secondary | ICD-10-CM | POA: Diagnosis not present

## 2014-04-13 DIAGNOSIS — M1990 Unspecified osteoarthritis, unspecified site: Secondary | ICD-10-CM | POA: Diagnosis not present

## 2014-04-13 DIAGNOSIS — J9622 Acute and chronic respiratory failure with hypercapnia: Secondary | ICD-10-CM | POA: Diagnosis not present

## 2014-04-13 DIAGNOSIS — I5020 Unspecified systolic (congestive) heart failure: Secondary | ICD-10-CM | POA: Diagnosis not present

## 2014-04-13 DIAGNOSIS — I10 Essential (primary) hypertension: Secondary | ICD-10-CM | POA: Diagnosis not present

## 2014-04-13 DIAGNOSIS — I272 Other secondary pulmonary hypertension: Secondary | ICD-10-CM | POA: Diagnosis not present

## 2014-04-13 DIAGNOSIS — Z9981 Dependence on supplemental oxygen: Secondary | ICD-10-CM | POA: Diagnosis not present

## 2014-04-13 DIAGNOSIS — M797 Fibromyalgia: Secondary | ICD-10-CM | POA: Diagnosis not present

## 2014-04-13 DIAGNOSIS — I2510 Atherosclerotic heart disease of native coronary artery without angina pectoris: Secondary | ICD-10-CM | POA: Diagnosis not present

## 2014-04-17 ENCOUNTER — Other Ambulatory Visit: Payer: Self-pay | Admitting: *Deleted

## 2014-04-17 DIAGNOSIS — R0602 Shortness of breath: Secondary | ICD-10-CM | POA: Diagnosis not present

## 2014-04-17 DIAGNOSIS — I5022 Chronic systolic (congestive) heart failure: Secondary | ICD-10-CM | POA: Diagnosis not present

## 2014-04-17 NOTE — Patient Outreach (Signed)
Spoke with pt on the phone, identity verified. [facility] services reviewed and offered to pt. Pt stated she did not need [facility] services at this time. She had home health and family support. RNCM gave name and phone number to pt in case pt decided she would like [facility] services in the future.  Rutherford Limerick RN, BSN  [facility] Care Management [phone])

## 2014-04-18 ENCOUNTER — Encounter: Payer: Self-pay | Admitting: *Deleted

## 2014-04-20 DIAGNOSIS — I5020 Unspecified systolic (congestive) heart failure: Secondary | ICD-10-CM | POA: Diagnosis not present

## 2014-04-20 DIAGNOSIS — J441 Chronic obstructive pulmonary disease with (acute) exacerbation: Secondary | ICD-10-CM | POA: Diagnosis not present

## 2014-04-20 DIAGNOSIS — Z9981 Dependence on supplemental oxygen: Secondary | ICD-10-CM | POA: Diagnosis not present

## 2014-04-20 DIAGNOSIS — M797 Fibromyalgia: Secondary | ICD-10-CM | POA: Diagnosis not present

## 2014-04-20 DIAGNOSIS — I272 Other secondary pulmonary hypertension: Secondary | ICD-10-CM | POA: Diagnosis not present

## 2014-04-20 DIAGNOSIS — I10 Essential (primary) hypertension: Secondary | ICD-10-CM | POA: Diagnosis not present

## 2014-04-20 DIAGNOSIS — I2510 Atherosclerotic heart disease of native coronary artery without angina pectoris: Secondary | ICD-10-CM | POA: Diagnosis not present

## 2014-04-20 DIAGNOSIS — J9622 Acute and chronic respiratory failure with hypercapnia: Secondary | ICD-10-CM | POA: Diagnosis not present

## 2014-04-20 DIAGNOSIS — M1990 Unspecified osteoarthritis, unspecified site: Secondary | ICD-10-CM | POA: Diagnosis not present

## 2014-04-21 NOTE — Patient Outreach (Signed)
[facility]) Care Management  04/21/2014  [name] [name] 01-17-1940 [id number]   Received notification from [place], RN to close case at this time as patient has refused to participate with [place] Management.  Case closed at this time.  [name]. [place] CM Assistant Phone: [phone] Fax: [phone]

## 2014-04-26 DIAGNOSIS — N3020 Other chronic cystitis without hematuria: Secondary | ICD-10-CM | POA: Diagnosis not present

## 2014-04-26 DIAGNOSIS — N390 Urinary tract infection, site not specified: Secondary | ICD-10-CM | POA: Diagnosis not present

## 2014-04-28 DIAGNOSIS — M797 Fibromyalgia: Secondary | ICD-10-CM | POA: Diagnosis not present

## 2014-04-28 DIAGNOSIS — I714 Abdominal aortic aneurysm, without rupture: Secondary | ICD-10-CM | POA: Diagnosis not present

## 2014-04-28 DIAGNOSIS — I272 Other secondary pulmonary hypertension: Secondary | ICD-10-CM | POA: Diagnosis not present

## 2014-04-28 DIAGNOSIS — M1990 Unspecified osteoarthritis, unspecified site: Secondary | ICD-10-CM | POA: Diagnosis not present

## 2014-04-28 DIAGNOSIS — I10 Essential (primary) hypertension: Secondary | ICD-10-CM | POA: Diagnosis not present

## 2014-04-28 DIAGNOSIS — I2510 Atherosclerotic heart disease of native coronary artery without angina pectoris: Secondary | ICD-10-CM | POA: Diagnosis not present

## 2014-04-28 DIAGNOSIS — J441 Chronic obstructive pulmonary disease with (acute) exacerbation: Secondary | ICD-10-CM | POA: Diagnosis not present

## 2014-04-28 DIAGNOSIS — I5020 Unspecified systolic (congestive) heart failure: Secondary | ICD-10-CM | POA: Diagnosis not present

## 2014-04-28 DIAGNOSIS — Z09 Encounter for follow-up examination after completed treatment for conditions other than malignant neoplasm: Secondary | ICD-10-CM | POA: Diagnosis not present

## 2014-04-28 DIAGNOSIS — I5022 Chronic systolic (congestive) heart failure: Secondary | ICD-10-CM | POA: Diagnosis not present

## 2014-04-28 DIAGNOSIS — Z9981 Dependence on supplemental oxygen: Secondary | ICD-10-CM | POA: Diagnosis not present

## 2014-04-28 DIAGNOSIS — J9622 Acute and chronic respiratory failure with hypercapnia: Secondary | ICD-10-CM | POA: Diagnosis not present

## 2014-05-02 NOTE — Op Note (Signed)
PATIENT NAME:  [name] [name] [name], [name] [name] [name] [name] [name] [name] [name] MR#:  [name] [name] [name] DATE OF BIRTH:  07-Sep-1940  DATE OF PROCEDURE:  11/26/2011  PREOPERATIVE DIAGNOSES:  1. Tear of the right rotator cuff. 2. Arthritis right acromioclavicular joint.  3. Impingement syndrome with acromial spurring, right shoulder.   POSTOPERATIVE DIAGNOSES:  1. Tear of the right rotator cuff. 2. Arthritis right acromioclavicular joint.  3. Impingement syndrome with acromial spurring, right shoulder.   OPERATION:  1. Arthroscopic right rotator cuff repair.  2. Arthroscopic excision of the entire distal clavicle.  3. Arthroscopic subacromial decompression and partial acromioplasty, right shoulder.   SURGEON: [name] [name] [name] [name] [name], M.D.   ANESTHESIA: General endotracheal plus interscalene block.   COMPLICATIONS: None.   DRAINS: None.   ESTIMATED BLOOD LOSS: Minimal.   REPLACEMENTS: None.   OPERATIVE FINDINGS: The patient had a tear at the insertion of the supraspinatus tendon. The tendon had not retracted. It had prominent anterior acromial spurring. The [facility] joint was hypertrophied, narrowing the rotator cuff outlet. The glenohumeral joint showed grade II to III chondromalacia on the glenoid and humeral head. The labrum was minimally frayed. The biceps tendon was intact.   OPERATIVE PROCEDURE: The patient was brought to the Operating Room where she underwent satisfactory general e anesthesia after a right interscalene block had been instilled. She was turned to the left lateral decubitus position and padded appropriately on the beanbag. The right shoulder was prepped and draped in sterile fashion and placed in 10 pounds of traction. The arthroscopy was carried out from a posterior portal with accessory portals laterally and anteriorly. The above findings were noted upon entering the subacromial space and the joint. A motorized resector was used to remove bursal tissue and soft tissue on the undersurface of the acromion. The probe was used to  identify the tear in the cuff which was seen from the dorsal side. There was no retraction, but there was a complete tear through the cuff that was about 2 cm in size. The bur was introduced and the partial acromioplasty carried out. An anterior portal was created, and the entire distal clavicle was excised as well. The arthroscope was redirected into the glenohumeral joint and a motorized resector introduced through the anterior portal. Minimal debridement was carried out. The above findings were noted with some chondromalacia. There were no loose bodies. The arthroscope was redirected into the subacromial bursa and a #2 Magnum Wire suture placed in the anterior and posterior flaps of the cuff. These sutures were brought out separately anteriorly and posteriorly. A second #2 Magnum Wire was then introduced into the center of the cuff and seated securely. The tap was then introduced minimally on the lateral border of the tuberosity. Prior to doing this, the bare footprint of the tuberosity was debrided with the ArthroCare wand and touched up with a bur slightly. The 6.5 mm SpeedScrew was introduced and seated nicely. The sutures were passed through the SpeedScrew device and were tightened down snugly bringing the cuff over nicely onto the bare footprint of the tuberosity. This was tightened fully and the sutures were cut. The free sutures in the anterior and posterior flaps of the cuff were then brought out through the lateral portal and were tied arthroscopically to provide over-the-top coverage and compression of the cuff. These sutures were also cut. This completed the necessary operative procedures. The wound was thoroughly irrigated and stab wounds closed with 3-0 nylon suture, and 0.25% Marcaine with epinephrine and morphine was placed into the bursa and the joint. Dry  sterile dressing with TENS pads was applied. The traction was released and the arm was placed in a sling. She was awakened and taken to  recovery in good condition.  ____________________________ [name] [name] Breed, MD hem:cbb D: 11/26/2011 13:17:49 ET T: 11/26/2011 13:31:24 ET    JOB#: [id number]  cc: [name] [name] Breed, MD, <Dictator> [name] [name] Breed MD ELECTRONICALLY SIGNED 11/28/2011 11:16

## 2014-05-04 ENCOUNTER — Ambulatory Visit
Admit: 2014-05-04 | Disposition: A | Discharge status: Home / Self Care | Payer: Self-pay | Attending: Oncology | Admitting: Oncology

## 2014-05-04 DIAGNOSIS — Z9981 Dependence on supplemental oxygen: Secondary | ICD-10-CM | POA: Diagnosis not present

## 2014-05-04 DIAGNOSIS — R928 Other abnormal and inconclusive findings on diagnostic imaging of breast: Secondary | ICD-10-CM | POA: Diagnosis not present

## 2014-05-04 DIAGNOSIS — I10 Essential (primary) hypertension: Secondary | ICD-10-CM | POA: Diagnosis not present

## 2014-05-04 DIAGNOSIS — I2510 Atherosclerotic heart disease of native coronary artery without angina pectoris: Secondary | ICD-10-CM | POA: Diagnosis not present

## 2014-05-04 DIAGNOSIS — Z853 Personal history of malignant neoplasm of breast: Secondary | ICD-10-CM | POA: Diagnosis not present

## 2014-05-04 DIAGNOSIS — I272 Other secondary pulmonary hypertension: Secondary | ICD-10-CM | POA: Diagnosis not present

## 2014-05-04 DIAGNOSIS — I5020 Unspecified systolic (congestive) heart failure: Secondary | ICD-10-CM | POA: Diagnosis not present

## 2014-05-04 DIAGNOSIS — J441 Chronic obstructive pulmonary disease with (acute) exacerbation: Secondary | ICD-10-CM | POA: Diagnosis not present

## 2014-05-04 DIAGNOSIS — J9622 Acute and chronic respiratory failure with hypercapnia: Secondary | ICD-10-CM | POA: Diagnosis not present

## 2014-05-04 DIAGNOSIS — M1990 Unspecified osteoarthritis, unspecified site: Secondary | ICD-10-CM | POA: Diagnosis not present

## 2014-05-04 DIAGNOSIS — M797 Fibromyalgia: Secondary | ICD-10-CM | POA: Diagnosis not present

## 2014-05-05 NOTE — Op Note (Signed)
PATIENT NAME:  [name] [name], [name] [name] MR#:  [id number] DATE OF BIRTH:  Jun 02, 1940  DATE OF PROCEDURE:  10/13/2012  PREOPERATIVE DIAGNOSES:  1.  Urinary tract infection with extended-spectrum beta-lactamase Escherichia coli.  2.  Acute encephalopathy. 3.  History of breast cancer.   POSTOPERATIVE DIAGNOSES:  1.  Urinary tract infection with extended-spectrum beta-lactamase Escherichia coli.  2.  Acute encephalopathy. 3.  History of breast cancer.   PROCEDURES:  1. Vascular access to right basilic vein.  2. Fluoroscopic guidance for placement of catheter.  3. Insertion of a single-lumen PICC line, right arm.  SURGEON: Hortencia Pilar, MD  ANESTHESIA: Local.   ESTIMATED BLOOD LOSS: Minimal.   INDICATION FOR PROCEDURE: The patient requiring IV antibiotics greater than 5 days.  DESCRIPTION OF PROCEDURE: The patient's right arm was sterilely prepped and draped, and a sterile surgical field was created. The basilic vein was accessed under direct ultrasound guidance without difficulty with a micropuncture needle and permanent image was recorded. 0.018 wire was then placed into the superior vena cava. Peel-away sheath was placed over the wire. A single lumen peripherally inserted central venous catheter was then placed over the wire and the wire and peel-away sheath were removed. The catheter tip was placed into the superior vena cava and was secured at the skin at 38 cm with a sterile dressing. The catheter withdrew blood well and flushed easily with heparinized saline. The patient tolerated procedure well. ____________________________ Katha Cabal, MD ggs:sb D: 10/19/2012 09:46:29 ET T: 10/19/2012 09:56:42 ET JOB#: [id number]  cc: Katha Cabal, MD, <Dictator> Katha Cabal MD ELECTRONICALLY SIGNED 10/26/2012 17:52

## 2014-05-05 NOTE — H&P (Signed)
PATIENT NAME:  [name] [name] [name], [name] [name] [name] MR#:  [name] [name] DATE OF BIRTH:  06/07/1940  DATE OF ADMISSION:  10/11/2012  REFERRING PHYSICIAN: Dr. Lurline Hare.   PRIMARY CARE PHYSICIAN: Financial risk analyst.   CHIEF COMPLAINT: Altered mental status.   HISTORY OF PRESENT ILLNESS: [name] [name] [name] is a 74 year old Caucasian female with a past medical history of breast cancer as well as recurrent UTIs followed by Dr. Jacqlyn Larsen who is presenting with altered mental status. According to her grandson, who is currently not present for questioning, she became more lethargic over the past 1 day and was brought to the Emergency Department for this. The patient herself does not recall the events leading up to her presentation to the Emergency Department but does state that she has had a fever and subjective chills for the last 2 to 3 days with associated suprapubic pain which is dull, 2 to 3 out of 10 in intensity, nonradiating. No worsening or relieving factors. She denies any dysuria or hematuria currently. She states that she is currently being treated for a UTI on Macrobid therapy per Dr. Jacqlyn Larsen for approximately 1 week in total, mentioning that her symptoms of dysuria have improved since time of diagnosis but fever and chills are new. She states that she gets a UTI approximately every 2 months. On presentation, she was found to be febrile at 102.9 degrees Fahrenheit and tachycardic at 114. Urinalysis performed positive for infection. She has been given antibiotics. Currently, she is asymptomatic and without complaints.   REVIEW OF SYSTEMS:  CONSTITUTIONAL: Fever and chills as above. Mentions generalized fatigue. Denies any weakness or pain.  EYES: Denies any blurred vision or pain.  ENT: Denies any hearing loss or discharge.  RESPIRATORY: Denies any cough, wheeze, shortness of breath.  CARDIOVASCULAR: Denies chest pain, palpitations, edema.  GASTROINTESTINAL: Denies nausea, vomiting, diarrhea. Mentioned suprapubic abdominal pain  as above.  GENITOURINARY: Denies any active dysuria, hematuria or increased urinary frequency.  ENDOCRINE: Denies nocturia or thyroid problems.  HEMATOLOGIC AND LYMPHATIC: Denies any easy bruising or bleeding.  SKIN: Denies rashes or lesions.  MUSCULOSKELETAL: Has arthritis, currently not active. Denies pain in neck, back or shoulder.  NEUROLOGIC: Denies any paralysis or paresthesias.  PSYCHIATRIC: Denies any anxiety or depressive symptoms.   Otherwise, full review of systems performed by me is negative.   PAST MEDICAL HISTORY: Peptic ulcer disease, migraines, osteoarthritis, fibromyalgia, breast cancer followed by Dr. Oliva Bustard, originally diagnosed in 2007, ER/PR positive, HER-2 neu negative, finished taking Femara in 2012.   SOCIAL HISTORY: Positive for tobacco abuse. Occasional alcohol usage. Denies any recreational drug usage.   FAMILY HISTORY: Positive for diabetes, coronary artery disease, hypertension, as well as a family member with DVT.    ALLERGIES: AUGMENTIN, TETRACYCLINE AND TAPE.   HOME MEDICATIONS: Acetaminophen/hydrocodone 325/2.5 mg 2 tabs 2 times a day, Ambien 6.25 mg p.o. at bedtime, clonazepam 1 mg p.o. at bedtime, gabapentin 400 mg p.o. b.i.d., imipramine 25 mg p.o. at bedtime, Prilosec 40 mg p.o. daily, tramadol 50 mg p.o. daily p.r.n. for pain   PHYSICAL EXAMINATION:  VITAL SIGNS: Temperature 102.9, heart rate 114, respirations 26, blood pressure 106/54, saturating 94% on supplemental O2.  GENERAL: Ill-appearing female, currently in no acute distress.  HEAD: Normocephalic, atraumatic.  EYES: Pupils equal and reactive to light. Extraocular muscles intact. No scleral icterus.  MOUTH: Dry mucosal membranes. Dentition intact. No abscesses noted.  EARS, NOSE AND THROAT: Throat clear without exudates. No external lesions.  NECK: Supple. No thyromegaly, lymphadenopathy, nodules or JVD.  PULMONARY: Diffuse coarse breath sounds with scattered rhonchi bilaterally. No wheezes or  rubs. No use of accessory muscles. Good respiratory effort.  CHEST: Nontender to palpation.  CARDIOVASCULAR: S1, S2, regular rate and rhythm. No murmurs, rubs or gallops. No pedal edema. Pedal pulses 2+.  GASTROINTESTINAL: Soft and nondistended. No masses. Minimal tenderness to palpation over suprapubic region. No rebound or guarding. No hepatosplenomegaly.  MUSCULOSKELETAL: No swelling, clubbing, edema. Range of motion full in all extremities.  NEUROLOGIC: Cranial nerves II through XII grossly intact. Sensation intact. Reflexes intact.  SKIN: No ulcerations, lesions or rashes. Skin is warm and dry. Turgor intact.  PSYCHIATRIC: Mood and affect are within normal limits. She is alert, oriented to person and place but not time. Insight and judgment intact.   LABORATORY DATA: Sodium 130, potassium 4.1, chloride 97, bicarb 28, BUN 11, creatinine 1.13 with baseline around 0.8, glucose 115. Total protein 7, albumin 3.1, bili 0.4, alk phos 116, AST 37, ALT 19. WBC 9.5, hemoglobin 11.9, platelets of 163. URINALYSIS: Leukocyte esterase 3+, nitrite positive, WBC 453, bacteria 1+. WBC clumping is positive, mucus also positive. EKG: Sinus tachycardia at 111. CHEST X-RAY: No acute cardiopulmonary process.   ASSESSMENT AND PLAN: A 74 year old Caucasian female with history of breast cancer, recurrent urinary tract infections followed by Dr. Jacqlyn Larsen of urology, presenting with altered mental status, found to be febrile and tachycardic.  1. Sepsis secondary to urinary source: Panculture including blood and urine. Give Levaquin for antibiotic coverage. Follow culture data and adjust accordingly. Intravenous fluid hydration with normal saline to keep mean arterial pressure greater than 65.  2. Encephalopathy: Secondary to #1. Will avoid further sedating agents.  3. Acute kidney injury in the setting of sepsis: Intravenous fluid hydration. Will check renal ultrasound as well.  4. Hyponatremia with sodium deficit of 279 which  would be 1.8 liters of normal saline. Will run normal saline at 75 mL an hour for the first 24 hours unless more is needed for blood pressure.  5. Deep venous thrombosis prophylaxis with heparin subcutaneous.    The patient is FULL CODE.   CRITICAL CARE TIME: 45 minutes.   ____________________________ Aaron Mose. Reda Gettis, MD dkh:gb D: 10/11/2012 03:06:20 ET T: 10/11/2012 04:00:51 ET JOB#: [id number]  cc: Aaron Mose.  Theys, MD, <Dictator> Heriberto Stmartin Woodfin Ganja MD ELECTRONICALLY SIGNED 10/11/2012 21:16

## 2014-05-05 NOTE — Discharge Summary (Signed)
PATIENT NAME:  [name] [name], [name] [name] PANGLE MR#:  [id number] DATE OF BIRTH:  November 15, 1940  DATE OF ADMISSION:  10/11/2012 DATE OF DISCHARGE:  10/13/2012  ADMITTING PHYSICIAN: Dr. Valentino Nose  DISCHARGING PHYSICIAN: Dr. Gladstone Lighter PRIMARY CARE PHYSICIAN: [facility]   DISCHARGE DIAGNOSES: 1.  Sepsis secondary to extended spectrum beta-lactamase urinary tract infection and bacteremia.  2.  Peptic ulcer disease.  3.  Osteoarthritis.  4.  Migraines.  5.  History of breast cancer, currently in remission.   DISCHARGE HOME MEDICATIONS: 1.  Tramadol 50 mg daily p.r.n.  2.  Klonopin 1 mg p.o. at bedtime.  3.  Omeprazole 40 mg in the morning.  4.  Gabapentin 400 mg p.o. b.i.d.  5.  Imipramine 25 mg at bedtime.  6.  Hydrocodone/acetaminophen 2.5 mg/325 mg 2 tablets b.i.d.  7.  Ambien 6.25 mg at bedtime.  8.  Invanz 1 gram IV q. 24 hours for 13 more days starting 10/14/2012.   DISCHARGE DIET: Regular diet.   DISCHARGE ACTIVITY: As tolerated.   FOLLOW-UP INSTRUCTIONS: 1.  PCP follow-up in 1 to 2 weeks.  2.  [place] for antibiotic administration and also PICC line care as needed.  3.  Urology follow-up with Dr. Jacqlyn Larsen in 2 to 3 weeks.     LABS AND IMAGING STUDIES PRIOR TO DISCHARGE:  Sodium 136, potassium 3.9, chloride 103, bicarbonate 28, BUN 6, creatinine 0.91, glucose 102 and calcium of 8.8.  WBC 9.5, hemoglobin 11.9, hematocrit 34.2, platelet count 163.  Urine cultures from 10/11/2012 are growing ESBL E. coli and blood cultures, 1 set out of 2, are growing gram-negative rods from 09/29.  Urinalysis  is strongly positive for infection.  Chest x-ray revealing clear lung fields.  Ultrasound of kidneys bilaterally showing normal kidneys.   BRIEF HOSPITAL COURSE: [name] [name] is a very pleasant 74 year old Caucasian female with past medical history of breast cancer, currently in remission, recent history of recurrent UTIs who has been seeing Urology, Dr. Jacqlyn Larsen, for the same,  comes to the hospital secondary to altered mental status and high-grade fevers,  1.  Sepsis with acute encephalopathy secondary to Escherichia coli infection. The patient was on Macrobid therapy and other antibiotic as an outpatient. Her urine cultures and 1 of the blood cultures are positive for ESBL and sent for repeat sensitivities. She was getting Levaquin and Rocephin in the hospital, both of which the E. coli was resistant to. She was changed over to [facility], and the patient got a PICC line placed and will be discharged home on Invanz. She has not been febrile over the last 24 hours, clinically feels better, and is being discharged home and will follow up with PCP as an outpatient.  2.  Acute encephalopathy secondary to sepsis from urinary tract infection.  Improved and back to baseline.  3.  Acute renal failure on admission.  Has resolved, likely secondary to sepsis   Her course has been otherwise uneventful in the hospital.   DISCHARGE CONDITION: Stable.   DISCHARGE DISPOSITION: Home with [place].   TIME SPENT ON DISCHARGE: 45 minutes.  ____________________________ Gladstone Lighter, MD rk:cb D: 10/13/2012 15:12:56 ET T: 10/13/2012 17:33:54 ET JOB#: [id number] cc: Gladstone Lighter, MD, <Dictator> Susan Moore MD ELECTRONICALLY SIGNED 10/27/2012 14:25

## 2014-05-08 DIAGNOSIS — Z4432 Encounter for fitting and adjustment of external left breast prosthesis: Secondary | ICD-10-CM | POA: Diagnosis not present

## 2014-05-08 DIAGNOSIS — C50112 Malignant neoplasm of central portion of left female breast: Secondary | ICD-10-CM | POA: Diagnosis not present

## 2014-05-14 NOTE — Discharge Summary (Signed)
PATIENT NAME:  [name] [name], [name] [name] MR#:  [id number] DATE OF BIRTH:  [birth year]  DATE OF ADMISSION:  03/29/2014 DATE OF DISCHARGE:    DISCHARGE DIAGNOSES:   1.  Acute on chronic respiratory failure with hypercapnia secondary to chronic obstructive pulmonary disease exacerbation and systolic congestive heart failure.  2.  Pulmonary hypertension.  3.  History of abdominal aortic aneurysm.  4.  Rheumatoid arthritis.  5.  Generalized weakness.   CHIEF COMPLAINT: Shortness of breath.   HISTORY OF PRESENT ILLNESS: [name] [name] is a 74 year old female with a history of COPD,  systolic congestive heart failure, CAD with minimal coronary artery disease based on recent cardiac catheter, history of pulmonary hypertension, chronic respiratory failure normally on oxygen, who presents to the ED complaining of chest pain, which she states was substernal location and rated as 8/10 on severity. The patient improved with 3 sublingual nitroglycerin tablets but continued to be short of breath with increased work of breathing. She was placed on BiPAP and her shortness of breath gradually improved.   PAST MEDICAL HISTORY: Significant for COPD, pulmonary hypertension, systolic heart failure with an EF of 20% based on recent cardiac catheter, history of coronary artery disease with minimal disease, history of peptic ulcer disease, abdominal aortic aneurysm, migraines, arthritis, fibromyalgia, and history of breast cancer. Please see H and P for full details. The patient was admitted to [facility] and initially placed on BiPAP, and subsequently placed on nasal cannula oxygen. She also received SVNs and IV Lasix. The patient's blood work did show elevation of troponin to 0.05 and was seen by cardiologist, Dr. Saralyn Pilar. She was started on low-dose Lasix, low-dose ACE, as well as low-dose carvedilol. Blood pressure was low and her carvedilol dose was dialed back. Chest x-ray showed evidence of vascular congestion with increased  interstitial markings concerning for mild interstitial edema. During her stay in the hospital, the patient made gradual progress. She was ambulated.   DISCHARGE MEDICATIONS: She was allowed home in stable condition on the following medications: Carvedilol 3.25 mg 1/2 tablet p.o. b.i.d., furosemide 20 mg once a day, Advair Diskus 250/50 two puffs b.i.d., nicotine patch 14 mg for 24 hours for 2 weeks and decrease to 7 mg a day for 24 hours then stop, hydrocodone 325/5 one tablet p.o. daily, lisinopril 2.5 mg once a day, Centrum Silver 1 tablet once a day, calcium plus D 600/200 one tab once a day, gabapentin 600 mg p.o. t.i.d., Ambien 6.25 mg extended release 1 tablet once a day, omeprazole 40 mg once a day, clonazepam 1 mg once a day at bedtime and tramadol 50 mg p.o. b.i.d. p.r.n. and Cipro 250 mg p.o. b.i.d. for 5 days.   FOLLOWUP: The patient was advised to have a repeat UA and a urine culture essentially done soon and follow up with me, Dr. Ginette Pitman, in 1 to 2 weeks' time.   Total time spent in discharge of this patient: 35 minutes   ____________________________ Tracie Harrier, MD vh:at D: 03/30/2014 12:41:11 ET T: 03/30/2014 14:06:54 ET JOB#: [id number]  cc: Tracie Harrier, MD, <Dictator> Tracie Harrier MD ELECTRONICALLY SIGNED 04/10/2014 18:10

## 2014-05-14 NOTE — Consult Note (Signed)
PATIENT NAME:  [name], [name] ELLIN FITZGIBBONS MR#:  [id number] DATE OF BIRTH:  [birth year]  DATE OF CONSULTATION:  03/29/2014  PRIMARY CARE PHYSICIAN:   Tracie Harrier, MD  CARDIOLOGIST:  Corey Skains, MD  CHIEF COMPLAINT:   Shortness of breath.  REASON FOR CONSULTATION: Requested for evaluation of elevated troponin and congestive heart failure.   HISTORY OF PRESENT ILLNESS: The patient is a 74 year old female with nonischemic dilated cardiomyopathy, chronic systolic congestive heart failure, COPD on chronic O2 therapy, who is admitted with chief complaint of shortness of breath. The patient recently underwent cardiac catheterization 03/15/2014, which revealed insignificant coronary artery disease with LV ejection fraction of 20%. The patient reports that she was in her usual state of health until last evening when she developed shortness of breath with chest tightness. She presented to [facility] Emergency Room where she was noted to be hypoxic and treated with BiPAP. The patient was admitted to telemetry with overall clinical improvement following treatment with intravenous furosemide and diuresis.  Troponin was borderline elevated at 0.05.  EKG was nondiagnostic.   PAST MEDICAL HISTORY: 1. Insignificant coronary artery disease by cardiac catheterization on 03/15/2014. 2. Nonischemic dilated cardiomyopathy.  3. Oxygen dependent COPD.  4. Pulmonary hypertension.  5. History of abdominal aortic aneurysm.  6. Peptic ulcer disease. 7. Fibromyalgia.   MEDICATIONS: Ambien 6.25 mg at bedtime, calcium 600 mg 1 daily, clonazepam 1 mg at bedtime, doxycycline 100 mg b.i.d., gabapentin 600 mg t.i.d., omeprazole 40 mg daily, multivitamin 1 daily, tramadol 50 mg daily p.r.n.   SOCIAL HISTORY: The patient currently lives alone. She continues to smoke 5-6 cigarettes per day.   FAMILY HISTORY: No immediate family history of coronary artery disease or myocardial infarction.   REVIEW OF SYSTEMS: CONSTITUTIONAL: No fever  or chills.  EYES: No blurry vision.  EARS: No hearing loss.  RESPIRATORY: Shortness of breath as described above.  CARDIOVASCULAR: Intermittent chest tightness.  GASTROINTESTINAL: No nausea, vomiting, or diarrhea.  GENITOURINARY: No dysuria or hematuria.  ENDOCRINE: No polyuria or polydipsia.  MUSCULOSKELETAL: No arthralgias or myalgias.  NEUROLOGICAL: No focal muscle weakness or numbness.  PSYCHOLOGICAL: No depression or anxiety.   PHYSICAL EXAMINATION: VITAL SIGNS: Blood pressure 101/57, pulse 87, respirations 20, temperature 97.3, pulse oximetry 97%.  HEENT: Pupils equal, reactive to light and accommodation.  NECK: Supple without thyromegaly.  LUNGS: Reveal decreased breath sounds with scattered expiratory wheezes.  CARDIOVASCULAR: Normal JVP. Diffuse PMI. Regular rate and rhythm. Normal S1, S2. No appreciable gallop, murmur, or rub.  ABDOMEN: Soft and nontender.  EXT:   Pulses were intact bilaterally.  MUSCULOSKELETAL: Normal muscle tone.  NEUROLOGIC: The patient is alert and oriented x3. Motor and sensory are both grossly intact.   IMPRESSION: A 74 year old female with insignificant coronary artery disease, admitted with respiratory failure, likely multifactorial, secondary to chronic obstructive pulmonary disease and nonischemic dilated cardiomyopathy with acute on chronic systolic congestive heart failure. Troponin is borderline elevated; however, the patient has known insignificant coronary artery disease. Elevated troponin likely due to demand supply ischemia.   RECOMMENDATIONS: 1. Agree with overall current therapy.  2. Add carvedilol 3.125 mg b.i.d.  3. Add low dose ACE inhibitor.  4. Add daily diuretic.  5. Defer full dose anticoagulation.  6. Defer further cardiac diagnostics at this time.     ____________________________ Isaias Cowman, MD ap:tr D: 03/29/2014 13:21:02 ET T: 03/29/2014 13:37:40 ET JOB#: [id number]  cc: Isaias Cowman, MD,  <Dictator> Isaias Cowman MD ELECTRONICALLY SIGNED 04/11/2014 12:45

## 2014-05-14 NOTE — H&P (Signed)
PATIENT NAME:  [name] [name], [name] [name] MR#:  [id number] DATE OF BIRTH:  [birth year]  DATE OF ADMISSION:  03/29/2014  REFERRING PHYSICIAN: Verdia Kuba. Paduchowski, MD   PRIMARY CARE PHYSICIAN: Tracie Harrier, MD  ADMISSION DIAGNOSIS: Acute on chronic respiratory failure with hypercapnia.   HISTORY OF PRESENT ILLNESS: This is a 74 year old Caucasian female who presents to the Emergency Department via EMS with respiratory distress. The patient states that she was sitting down in her home and she became acutely short of breath. She already had her oxygen on at that time, as she has a history of COPD and usually wears oxygen about 14 hours per day, when she felt as if she could not breathe. This was associated with chest pain that began substernally. It did not radiate. The pain was 8/10 in severity. When EMS arrived, they gave the patient 3 sublingual nitroglycerin tablets and her pain is now resolved. The patient recently underwent a cardiac catheterization and had had similar symptoms in the recovery room. Her past medical history is notable for heart failure and a failed nuclear stress test. In the Emergency Department, the patient was placed on BiPAP with FiO2 originally 40%. By the time of my examination, the patient was  feeling much more comfortable and we reduced her BiPAP settings; however, due to her continued work of breathing, the Emergency Department called for admission.   REVIEW OF SYSTEMS:  CONSTITUTIONAL: The patient denies fever or weakness.  EYES: Denies blurred vision or inflammation.  EARS, NOSE AND THROAT: Denies tinnitus or sore throat.  RESPIRATORY: Admits to shortness of breath, but denies cough.  CARDIOVASCULAR: Admits to chest pain, which is now resolved, but denies palpitations, orthopnea, or paroxysmal nocturnal dyspnea.  GASTROINTESTINAL: Denies nausea, vomiting, diarrhea, or abdominal pain.  GENITOURINARY: Denies dysuria at this time, but admits to increased frequency and hesitancy  of urination (this is a chronic problem).  ENDOCRINE: Denies polyuria or polydipsia.  HEMATOLOGIC AND LYMPHATIC: Denies easy bruising or bleeding.  INTEGUMENTARY: Denies rashes or lesions.  MUSCULOSKELETAL: The patient admits to arthralgias all over her body but denies myalgias.  NEUROLOGIC: Denies numbness in her extremities or dysarthria.  PSYCHIATRIC: Denies depression or suicidal ideation.   PAST MEDICAL HISTORY: COPD, pulmonary hypertension, systolic heart failure, coronary artery disease, peptic ulcer disease, abdominal aortic aneurysm, migraines, arthritis, fibromyalgia, and history of breast cancer.   PAST SURGICAL HISTORY: Right mastectomy, hysterectomy, left eye surgery, gastric perforation repair, and recent cardiac catheterization.   SOCIAL HISTORY: The patient lives alone. She smokes 5-6 cigarettes per day. She denies any alcohol or drug use.   FAMILY HISTORY: Significant for diabetes and hypertension.   MEDICATIONS:  1.  Ambien 6.25 mg extended release 1 tablet p.o. at bedtime.  2.  Calcium 600 mg with vitamin D 200 international units 1 tablet p.o. daily.  3.  Clonazepam 1 mg 1 tablet p.o. at bedtime.  4.  Doxycycline 100 mg 1 tablet p.o. b.i.d.  5.  Gabapentin 600 mg 1 tablet p.o. t.i.d.  6.  Medication for bladder incontinence (name unknown) 1 tablet p.o. at bedtime.  7.  Omeprazole 40 mg 1 tablet p.o. every morning.  8.  Multivitamin 1 tablet p.o. daily.  9.  Tramadol 50 mg 1 tablet p.o. daily as needed for pain.   ALLERGIES: AUGMENTIN, INTRAVENOUS DYE, PETROLEUM JELLY, TETRACYCLINE, AND TAPE.   PERTINENT LABORATORY RESULTS AND RADIOGRAPHIC FINDINGS: Serum glucose is 253, BNP is 1678, BUN is 10, creatinine is 1.1, serum sodium is 132, potassium is 3.8,  chloride is 95, bicarbonate is 26, calcium is 8.8. Troponin is negative. White blood cell count is 13.3, hemoglobin 13.5, hematocrit 41.5, platelet count is 279,000. MCV is 94. Urinalysis shows 25 white blood cells per  high-power field, it is negative for nitrites, but is 1+ leukocyte esterase positive, there are 3+ bacteria seen. ABG shows a pH of 7.32, pCO2 of 48, pO2 of 104 with an FiO2 of 50%, base excess of -2. Chest x-ray shows vascular congestion with increased interstitial markings concerning for mild interstitial edema.   PHYSICAL EXAMINATION:  VITAL SIGNS: Temperature is 98.3, pulse 91, respirations 18, blood pressure is 110/63, pulse oximetry is 95% on 2 L of oxygen via nasal cannula.  GENERAL: The patient is alert and oriented x 3 in no apparent distress.  HEENT: Normocephalic, atraumatic. Pupils equal, round, and reactive to light and accommodation. Extraocular movements are intact. Mucous membranes are moist.  NECK: Trachea is midline. No adenopathy. Thyroid is nonpalpable and nontender.  CHEST: Symmetric and atraumatic.  CARDIOVASCULAR: Regular rate and rhythm. Normal S1, S2. No rubs, clicks, or murmurs appreciated.  LUNGS: Clear to auscultation bilaterally. The patient is wearing BiPAP at the time of my physical examination.  ABDOMEN: Positive bowel sounds. Soft, nontender, nondistended. No hepatosplenomegaly.  GENITOURINARY: Deferred.  MUSCULOSKELETAL: The patient moves all 4 extremities equally. There is 5/5 strength in upper and lower extremities bilaterally. I have not tested the patient's gait, as she is on BiPAP at this time.  SKIN: Warm and dry. There are no rashes or lesions.  EXTREMITIES: There is no clubbing, cyanosis, or edema, but there is ulnar deviation of the patient's fingers.  NEUROLOGIC: Cranial nerves II-XII are grossly intact.  PSYCHIATRIC: Mood is normal. Affect is congruent. The patient has excellent insight and judgment into her medical condition.   ASSESSMENT AND PLAN: This is a 74 year old female with acute on chronic respiratory failure with hypercarbia.  1.  Respiratory failure, acute on chronic. The patient has mild hypercapnia at this time. She is not quite  compensated, but certainly not severe with her respiratory acidosis. We will continue BiPAP until the patient is much more comfortable.  2.  Chronic obstructive pulmonary disease. The patient does not have any inhalers at home. I will start an inhaled corticosteroid.  3.  Coronary artery disease. The patient's recent heart catheterization shows minimal 3-vessel disease, but patient has also failed a nuclear stress test, which makes her presenting symptoms somewhat concerning. We will cycle her cardiac enzymes. It does not appear that she has any EKG changes, but we will continue rule out ischemia. Cardiology consult at the discretion of the primary team.  4.  Congestive heart failure. Systolic heart failure, but her cardiac catheterization reports left ventricular dysfunction but the patient reports right-sided heart failure, which makes sense given the report of pulmonary hypertension from the same heart catheterization. The patient's ejection fraction 20%. She was given Lasix 40 mg intravenous in the Emergency Department and she is breathing much more comfortably now.  5.  Pulmonary hypertension. This is likely contributing to the patient's heart failure and may have been the etiology of what occurred prior to this admission. I have ordered a pulmonary consult to address the potential benefit of a nitrous oxide promoter such as sildenafil or bosentan.  6.  Acute kidney injury. This is likely secondary to mild dehydration. The patient admits to having decreased appetite lately. I have ordered some antiemetics as needed at mealtime. The patient does not clinically have heart failure  at this time, as her lungs are clear and she no longer has pulmonary edema. I will start gentle intravenous fluid.  7.  Peptic ulcer disease. We will continue the patient's daily proton pump inhibitor.  8.  Abdominal aortic aneurysm appears stable. The patient reports that she has 5 aneurysmal areas. Her renal function is not  dramatically bad at this time, but I wonder if she is reporting possible fibromuscular dysplasia of the renal artery.  9.  Arthritis. The patient has the appearance of rheumatoid arthritis clinically, but she states that she was tested by rheumatology at [facility] who found her to have regular osteoarthritis. It is unlikely that this is contributing to her respiratory condition.  10.  History of breast cancer. The patient has completed Femara in 2012.  11.  Leukocytosis, mild. This is likely from Solu-Medrol given in the Emergency Department. The patient currently has no signs or symptoms of sepsis, although when she arrived she was tachycardic and tachypneic, but this is likely due to respiratory distress and her nebulizer treatments. If the patient displays recurrent signs or symptoms of sepsis, we will obtain blood cultures, add antibiotics, and treat accordingly.  12.  Deep vein thrombosis prophylaxis. Heparin.  13.  Gastrointestinal prophylaxis. As above.   CODE STATUS: The patient is a full code.   TIME SPENT ON ADMISSION ORDERS AND PATIENT CARE: Approximately 45 minutes.    ____________________________ Norva Riffle. Marcille Blanco, MD msd:bm D: 03/29/2014 02:03:24 ET T: 03/29/2014 02:37:10 ET JOB#: [id number]  cc: Norva Riffle. Marcille Blanco, MD, <Dictator> Norva Riffle Adrena Nakamura MD ELECTRONICALLY SIGNED 03/30/2014 11:47

## 2014-05-17 ENCOUNTER — Other Ambulatory Visit: Payer: Self-pay | Admitting: Vascular Surgery

## 2014-05-17 DIAGNOSIS — I712 Thoracic aortic aneurysm, without rupture, unspecified: Secondary | ICD-10-CM

## 2014-05-17 DIAGNOSIS — I5022 Chronic systolic (congestive) heart failure: Secondary | ICD-10-CM | POA: Diagnosis not present

## 2014-05-17 DIAGNOSIS — I714 Abdominal aortic aneurysm, without rupture, unspecified: Secondary | ICD-10-CM

## 2014-05-17 DIAGNOSIS — R0602 Shortness of breath: Secondary | ICD-10-CM | POA: Diagnosis not present

## 2014-05-19 DIAGNOSIS — I2510 Atherosclerotic heart disease of native coronary artery without angina pectoris: Secondary | ICD-10-CM | POA: Diagnosis not present

## 2014-05-19 DIAGNOSIS — J449 Chronic obstructive pulmonary disease, unspecified: Secondary | ICD-10-CM | POA: Insufficient documentation

## 2014-05-19 DIAGNOSIS — M150 Primary generalized (osteo)arthritis: Secondary | ICD-10-CM | POA: Diagnosis not present

## 2014-05-19 DIAGNOSIS — I5022 Chronic systolic (congestive) heart failure: Secondary | ICD-10-CM | POA: Diagnosis not present

## 2014-05-19 DIAGNOSIS — C801 Malignant (primary) neoplasm, unspecified: Secondary | ICD-10-CM | POA: Diagnosis not present

## 2014-05-24 ENCOUNTER — Ambulatory Visit: Payer: Commercial Managed Care - HMO

## 2014-05-24 ENCOUNTER — Ambulatory Visit
Admission: RE | Admit: 2014-05-24 | Discharge: 2014-05-24 | Disposition: A | Discharge status: Home / Self Care | Payer: Commercial Managed Care - HMO | Source: Ambulatory Visit | Attending: Vascular Surgery | Admitting: Vascular Surgery

## 2014-05-24 ENCOUNTER — Ambulatory Visit: Admission: RE | Admit: 2014-05-24 | Payer: Commercial Managed Care - HMO | Source: Ambulatory Visit

## 2014-05-24 DIAGNOSIS — I712 Thoracic aortic aneurysm, without rupture, unspecified: Secondary | ICD-10-CM

## 2014-05-24 DIAGNOSIS — K551 Chronic vascular disorders of intestine: Secondary | ICD-10-CM | POA: Insufficient documentation

## 2014-05-24 DIAGNOSIS — I77810 Thoracic aortic ectasia: Secondary | ICD-10-CM | POA: Insufficient documentation

## 2014-05-24 DIAGNOSIS — I7409 Other arterial embolism and thrombosis of abdominal aorta: Secondary | ICD-10-CM | POA: Diagnosis not present

## 2014-05-24 DIAGNOSIS — I714 Abdominal aortic aneurysm, without rupture, unspecified: Secondary | ICD-10-CM

## 2014-05-24 HISTORY — DX: Unspecified asthma, uncomplicated: J45.909

## 2014-05-24 HISTORY — DX: Malignant (primary) neoplasm, unspecified: C80.1

## 2014-05-24 HISTORY — DX: Essential (primary) hypertension: I10

## 2014-05-24 HISTORY — DX: Heart failure, unspecified: I50.9

## 2014-05-24 HISTORY — DX: Malignant neoplasm of unspecified site of unspecified female breast: C50.919

## 2014-05-24 MED ORDER — IOHEXOL 350 MG/ML SOLN
100.0000 mL | Freq: Once | INTRAVENOUS | Status: AC | PRN
  Administered 2014-05-24: 100 mL via INTRAVENOUS

## 2014-05-30 DIAGNOSIS — J449 Chronic obstructive pulmonary disease, unspecified: Secondary | ICD-10-CM | POA: Diagnosis not present

## 2014-05-30 DIAGNOSIS — F17200 Nicotine dependence, unspecified, uncomplicated: Secondary | ICD-10-CM | POA: Diagnosis not present

## 2014-05-30 DIAGNOSIS — I716 Thoracoabdominal aortic aneurysm, without rupture: Secondary | ICD-10-CM | POA: Diagnosis not present

## 2014-05-31 DIAGNOSIS — R339 Retention of urine, unspecified: Secondary | ICD-10-CM | POA: Diagnosis not present

## 2014-05-31 DIAGNOSIS — R350 Frequency of micturition: Secondary | ICD-10-CM | POA: Diagnosis not present

## 2014-05-31 DIAGNOSIS — N3941 Urge incontinence: Secondary | ICD-10-CM | POA: Diagnosis not present

## 2014-05-31 DIAGNOSIS — N3020 Other chronic cystitis without hematuria: Secondary | ICD-10-CM | POA: Diagnosis not present

## 2014-06-05 NOTE — Patient Outreach (Signed)
[facility]) Care Management  06/05/2014  [name] [name] 12-Dec-1940 [id number]   Call Center report received for 06/03/2014, assigned Maury Dus, RN to follow up with patient regarding concerns.  [name] [name]. [place], [place] Management [place] Assistant Phone: [phone] Fax: [phone]

## 2014-06-06 ENCOUNTER — Other Ambulatory Visit: Payer: Self-pay

## 2014-06-06 DIAGNOSIS — J441 Chronic obstructive pulmonary disease with (acute) exacerbation: Secondary | ICD-10-CM

## 2014-06-06 NOTE — Patient Outreach (Addendum)
[place] [facility]) Care Management  06/06/2014  CHERILYNN SCHOMBURG [birth year] [id number]   Notification from Maury Dus, RN assignment of Pharmacy and Social Work was needed, assigned Deanne Coffer, PharmD and [employer], [place]. [place], [place] Management [place] Assistant Phone: [phone] Fax: [phone]

## 2014-06-06 NOTE — Patient Outreach (Signed)
[place] [facility]) Care Management  06/06/2014  [name] [name] 07/19/1940 [id number]   RN CM followed up with this patient on a call made to the 24 hour nurse line.  Patient was having trouble with her oxygen tubing.  The problem has since been resolved with [facility] assisting patient with more oxygen tubing.  Patient states she is on 2.5 liters of oxygen continuous.  RN CM informed patient that she is eligible to participate in the services of [facility].  Patient stated she did not need any nursing support, however she would like to speak with a [facility] Education officer, museum and Production designer, theatre/television/film.  Patient wanted to find out if she qualifies for LIS program for medications.  Patient wanted to talk with a social worker about financial resources she may qualify for.  Patient states she has to see three specialist and it is getting harder to afford the co-pays.  RN CM will make a referral to pharmacy for LIS resources and social work for help with financial resources patient may qualify for.   Maury Dus, RN, [place]mael Holter, [place] Telephonic Care Coordinator [phone]

## 2014-06-09 ENCOUNTER — Other Ambulatory Visit: Payer: Self-pay | Admitting: Pharmacist

## 2014-06-09 ENCOUNTER — Other Ambulatory Visit: Payer: Self-pay | Admitting: *Deleted

## 2014-06-09 NOTE — Patient Outreach (Signed)
[place] [facility] [facility]) Care Management  [place]   06/09/2014  [name] [name] 03-Jan-1941 [id number]  Subjective: [name] [name] is a 74 y.o. referred to pharmacy for assistance in completing a low income subsidy application, assistance with affording patient's generic Macrobid and for Smoking Cessation.  Reviewed [name] [name]'s medications with her and documented this list into EPIC. Patient reports that she gets her medications from [facility] [facility] in [place], [place]. Patient reports that she currently organizes her medications in their bottles in her living room where she can see them to remember to take them. Spoke with patient about using a pillbox. Patient reports that she has sent her grandson out to get one for her.   Discussed the number of sedating medications that she is taking. Patient reports that she has had no falls. Reports that her physician has prescribed the imipramine to reduce her urinary frequency.  [name] [name] reports that because of her heart failure, she has been told to check her weight daily, but that she has not been doing so because she forgets. Patient states that she will move her bathroom scale out into the living room so that she sees it and remembers. Spoke with her about keeping a log, she states that she has one. She reports that she was told by her doctor to call/go to the hospital if she has a weight change of 3 lbs or more/day or 5 lbs or more/week. Patient reports that she does not have a blood pressure monitor because they are too expensive. Reports that she takes her oxygen tank with her up to the drug store when she wants to check it.  Spoke with patient about her generic Macrobid prescription. According to the [employer], this medication is a tier 4 medication through her insurance. There is an additional notation that explains that this is a high risk medication. I explained to the patient why it is considered high risk. [name] [name] states  that she is on it for recurrent UTI infections and that per her Urologist, Dr, [name] [name], there is only one other medication (but cannot remember the name) that he could put her on and it would be more expensive. Asked [name] [name] if I could contact her Urologist. She agreed and gave me his phone number, [phone].  Spoke with patient about low income subsidy application. Patient is interested in my completing this with her over the phone, but she does not at this time have all of the financial information with her. Scheduled a telephone call with her for 06/16/14 at 2 pm for [place] to complete this.  Asked patient about her interest in smoking cessation. Patient reports that she has quit many times in the past, but then started again. The longest time that she quit for was four months. Reports that she only smokes occasionally now and is not interested in completely stopping. Stated "What's one cigarette, when there is nothing that can be done", referring to her other medical problems. Asked patient to contact me if she decides that she wants to stop in the future.  Patient reports that her eyesight has been getting worse recently. She couldn't give me a time frame of how long this has been going on, but reports that she used to be able to see with her glasses, but now cannot. Patient did not want to call her previous eye doctor because she states that she can't afford to go over there. Advised patient to call her PCP  to let him know of this issue.   Objective:   Current Medications: Current Outpatient Prescriptions  Medication Sig Dispense Refill  . calcium-vitamin D (OSCAL WITH D) 500-200 MG-UNIT per tablet Take 1 tablet by mouth at bedtime.    . carvedilol (COREG) 6.25 MG tablet Take 6.25 mg by mouth 2 (two) times daily with a meal.    . clonazePAM (KLONOPIN) 1 MG tablet Take 1 mg by mouth 2 (two) times daily.    . furosemide (LASIX) 20 MG tablet Take 20 mg by mouth every other day.    .  gabapentin (NEURONTIN) 600 MG tablet Take 600 mg by mouth 3 (three) times daily.    [name] imipramine (TOFRANIL) 25 MG tablet Take 25 mg by mouth at bedtime.    [name] lisinopril (PRINIVIL,ZESTRIL) 2.5 MG tablet Take 2.5 mg by mouth daily.    . multivitamin-iron-minerals-folic acid (CENTRUM) chewable tablet Chew 1 tablet by mouth daily.    . nitrofurantoin, macrocrystal-monohydrate, (MACROBID) 100 MG capsule Take 100 mg by mouth at bedtime.    . traMADol (ULTRAM) 50 MG tablet Take 50 mg by mouth daily.    [name] zolpidem (AMBIEN) 5 MG tablet Take 5 mg by mouth at bedtime as needed for sleep.     No current facility-administered medications for this visit.    Functional Status: No flowsheet data found.  Fall/Depression Screening: No flowsheet data found.  Assessment:  Patient reports that she has had no falls in the past year. However, patient is over 68 years of age and on several sedating medications, including: gabapentin, clonazepam, zolpidem, tramadol and imipramine. Tricyclic antidepressants, such as imipramine, are considered a high risk medication to be avoided in the elderly according to the 2015 Beers Criteria due to anticholinergic effects, sedation and orthostatic hypotension, particularly when combined with other CNS-active drugs. Recommends caution with the use of clonazepam and zolpidem due to their increased sedation and fall risk.  Patient has congestive heart failure and needs to monitor weight daily and check blood pressure regularly.  Patient reports that she is having trouble affording her medications.  Patient's generic Macrobid is a high risk medication for her because use in the elderly, particularly females receiving long-term prophylaxis for recurrent UTIs, has been associated with an increased risk of hepatic toxicity and peripheral neuropathy.   Plan:  Will call patient on 06/16/14 to complete the low income subsidy application. At that time, will also ask patient about  whether she has been able to get a pillbox. Will check to see if she has been recording her daily weights. Will discuss possibility of getting a blood pressure monitor with the patient through [facility].  Will call to follow up with patient's Urologist, Dr. Jacqlyn Larsen to determine if there is an alternative to [facility] that this patient can use for UTI prophylaxis.  Will contact patient's PCP, Dr. Ginette Pitman, to discuss the multiple sedating medications that the patient is currently taking.   Harlow Asa, PharmD Clinical Pharmacist [place] Management [phone]

## 2014-06-09 NOTE — Patient Outreach (Signed)
[place] [facility]) Care Management  06/09/2014  [name] [name] Nov 15, 1940 [id number]  Phone call to patient to offer community resources.  Per patient, she is experiencing financial difficulty in regards to paying her co-pays for specialist.  Per patient, she runs low on food in order to pay for her doctor visits and medicines.  Patient is over the income limit for Medicaid as well as special assistance. Per patient, she receives retirement and a pension.  Patient's grandson resides with her, however due to medical issues of his own he is not able to work consistently.    Patient described significant back pain.  Patient urged to go to [facility] urgent care if needed.   Community resources for food assistance and financial counseling mailed to patient for future reference. Patient's case to be closed to social work.    [place], [place] Management [place], [place] Care Management [phone]

## 2014-06-14 ENCOUNTER — Other Ambulatory Visit: Payer: Self-pay | Admitting: Pharmacist

## 2014-06-14 NOTE — Patient Outreach (Signed)
Called to discuss Ms. Stailey's multiple sedating medications, including: gabapentin, clonazepam, zolpidem, tramadol and imipramine with Dr. Ginette Pitman and discuss current benefit of these agents and possible alternatives.  Tricyclic antidepressants, such as imipramine, are considered a high risk medication to be avoided in the elderly according to the 2015 Beers Criteria due to anticholinergic effects, sedation and orthostatic hypotension, particularly when combined with other CNS-active drugs. Recommends caution with the use of clonazepam and zolpidem due to their increased sedation and fall risk.  Left a message with Charlene and Dr. Linton Ham office. If have not received a call back by 06/16/14, will call again at that time.  Harlow Asa, PharmD Clinical Pharmacist [place] Management [phone]

## 2014-06-14 NOTE — Patient Outreach (Signed)
Called patient's Urologist, Dr. Jacqlyn Larsen to determine if there is an alternative to [facility] that this patient can use for UTI prophylaxis. Patient is having trouble affording her medications and reports that this medication is $98/month through her insurance. According to the [employer], this medication is a tier 4 medication through her insurance. There is an additional notation that explains that this is a high risk medication.   Additionally, this is a high risk medication for her because use in the elderly, particularly females receiving long-term prophylaxis for recurrent UTIs, has been associated with an increased risk of hepatic toxicity and peripheral neuropathy.  Left a message with Tammy at Dr. Bjorn Loser office. If have not heart back from Dr. Jacqlyn Larsen by 06/16/14, will call again at that time.  Harlow Asa, PharmD Clinical Pharmacist [place] [facility] [phone]

## 2014-06-14 NOTE — Patient Outreach (Signed)
Contacted patient to let her know that I would be sending her a [facility] Consent form through the mail to complete and send back to [place]. Mailed this form out today.  Harlow Asa, PharmD Clinical Pharmacist [place] [facility] [phone]

## 2014-06-14 NOTE — Patient Outreach (Addendum)
Received a call back from [place] at Ms. Claggett's Urologist office in response to my question about an alternative to [facility] that this patient can use for UTI prophylaxis, as patient is having financial difficulty affording this medication considered high-risk by her insurance company.   Per Manuela Schwartz, Dr. Jacqlyn Larsen has replied that doxycycline would be an alternative option for UTI prevention. However, reports that this is not an option in the patient as she has a doxycycline allergy. This allergy not currently documented in our local EPIC system and was not mentioned by Ms. Odwyer when I asked about drug allergies during our phone conversation on 06/09/14. Manuela Schwartz also reports that Dr. Jacqlyn Larsen stated that he disagrees about Macrobid being high risk in this patient given that it is a low dose and he considers it to be the safest option for the patient given her allergies.  Upon reviewing patient's allergies in Care Everywhere, note three drug allergies not listed in our local EPIC system. These are Augmentin, sulfa and tetracyclines. Per the [facility] system, the doxycycline reaction type is listed as "hives". Will document these allergies in our local EPIC system.  Will follow up with Ms. Ouzts about these allergies. Will also let her know of Dr. Bjorn Loser response.   Harlow Asa, PharmD Clinical Pharmacist [facility] [phone]

## 2014-06-14 NOTE — Patient Outreach (Signed)
Called [name] [name] to discuss her doxycyline, Augmentin and sulfa allergies listed in the [facility] EPIC system. Left a HIPAA compliant message on the patient's voicemail. If have not heard from patient by 06/16/14, will discuss this with her during our telephone appointment at 2 pm.  Harlow Asa, [place] [facility] [phone]

## 2014-06-16 ENCOUNTER — Other Ambulatory Visit: Payer: Self-pay | Admitting: Pharmacist

## 2014-06-16 NOTE — Patient Outreach (Signed)
[facility]) Care Management  [place]   06/16/2014  [name] [name] 10-29-1940 [id number]  Subjective: [name] [name] is a 74 y.o. female referred to pharmacy for assistance in completing a low income subsidy application and assistance with affording patient's generic Macrobid. Calling patient today to assist her in completing the extra help application, to follow up about my conversation with her Urologist, and to follow up about her getting a new pillbox and whether she is taking her daily weights.  Patient reports that she has been staying tired. Reports that she "has to push herself to get up." Discussed her helping to take care of her neighbor who recently had a stroke. Reports that her grandson, who was previously staying with her, is not at this time. She has two dogs who are sitting with her as we talk.  Patient reported that she has three more medications: Ventolin, Combivent and Symbicort that she forgot to tell me about last time we spoke. Reports that she is not using any of these because she does not need them with her oxygen. Educated patient about the importance of using these medications as well, to improve her breathing. Counseled patient that Symbicort is not an "as needed" medication and is to be used twice daily and then to rinse mouth after each use.  Patient reports that she has been checking her weight every day since we last spoke. Reports that her weight was 123 lbs on 5/28, but has been 122 consistently every other day. Reports that she attributes the higher weight of 123 lbs to the fact that she had not yet voided her bowels when she took that weight.   Reports that she forgot to get the new pillbox. However, made a note right now to get her pillbox today when she goes to pick up her medications. Reports that her blood pressure when she went to the pharmacy a couple of days ago was 112/80. Discussed the value of having a machine in her home to keep  track of this as patient has had lower blood pressures in the past and is on medications for heart failure that also lower blood pressure. However, patient continues to feel that her use of the monitor at the drug store is sufficient. Also, she does not feel like she can afford to purchase a blood pressure monitor, even in installments, but does not wish to speak with social work for a financial assessment, as she feels like she will not qualify because she receives too much money.  Let patient know that I spoke with her Urologist, Dr. Jacqlyn Larsen, and that he informed me that doxycycline would be an alternative option for UTI prevention. However, reports that this is not an option for her as she has a doxycycline allergy. Confirmed this allergy with her as well as her allergies to sulfa and Augmentin. Patient verbalized understanding.  Completed the Extra Help application with Ms. Supple through the [employer]. Received permission from the patient to submit this form online on her behalf. Have printed a copy and will bring this to the patient at our next meeting.  Again discussed with patient her multiple sedating/fall risk medications and let her know that I contacted her PCP. Discussed with patient that even though she takes the majority of these at bedtime, they can still affect her in this way the following day. Patient again reports that she does not have any dizziness or sedation related to these medications and "has  been using them for years." Patient also reports that the imipramine controls her urinary symptoms well.  Offered to meet with patient in person to help her with her medication organization and adherence. Also discussed the benefit of also having a nurse care manager meet with her. Patient expressed willingness to meet with [place].   Objective:   Current Medications: Current Outpatient Prescriptions  Medication Sig Dispense Refill  . albuterol (PROVENTIL HFA;VENTOLIN HFA) 108 (90  BASE) MCG/ACT inhaler Inhale into the lungs every 6 (six) hours as needed for wheezing or shortness of breath.    [name] albuterol-ipratropium (COMBIVENT) 18-103 MCG/ACT inhaler Inhale into the lungs every 4 (four) hours.    . budesonide-formoterol (SYMBICORT) 160-4.5 MCG/ACT inhaler Inhale 2 puffs into the lungs 2 (two) times daily.    . calcium-vitamin D (OSCAL WITH D) 500-200 MG-UNIT per tablet Take 1 tablet by mouth at bedtime.    . carvedilol (COREG) 6.25 MG tablet Take 6.25 mg by mouth 2 (two) times daily with a meal.    . clonazePAM (KLONOPIN) 1 MG tablet Take 1 mg by mouth 2 (two) times daily.    . furosemide (LASIX) 20 MG tablet Take 20 mg by mouth every other day.    . gabapentin (NEURONTIN) 600 MG tablet Take 600 mg by mouth 3 (three) times daily.    [name] imipramine (TOFRANIL) 25 MG tablet Take 25 mg by mouth at bedtime.    [name] lisinopril (PRINIVIL,ZESTRIL) 2.5 MG tablet Take 2.5 mg by mouth daily.    . multivitamin-iron-minerals-folic acid (CENTRUM) chewable tablet Chew 1 tablet by mouth daily.    . nitrofurantoin, macrocrystal-monohydrate, (MACROBID) 100 MG capsule Take 100 mg by mouth at bedtime.    . traMADol (ULTRAM) 50 MG tablet Take 50 mg by mouth daily.    [name] zolpidem (AMBIEN) 5 MG tablet Take 5 mg by mouth at bedtime as needed for sleep.     No current facility-administered medications for this visit.    Functional Status: No flowsheet data found.  Fall/Depression Screening: No flowsheet data found.  Assessment:  Patient continues to be on multiple sedating medications that put her at increased risk of falls, including: gabapentin, clonazepam, zolpidem, tramadol and imipramine.  Based on patient report, patient is not currently adherent to her medications, particularly her inhalers.  Plan:  Will follow up with Dr. Ginette Pitman about patient's multiple sedating medications.  Will meet with Ms. Hadlock on Friday, 06/23/14 at 1 pm in her home to review her medications and strategies to  help with adherence. Will contact Nurse Care Manager Janci Minor, who has previously been in contact with this patient, to see if she is able to join [place] at this appointment.  Harlow Asa, PharmD Clinical Pharmacist [place] [facility] [phone]

## 2014-06-17 DIAGNOSIS — I5022 Chronic systolic (congestive) heart failure: Secondary | ICD-10-CM | POA: Diagnosis not present

## 2014-06-17 DIAGNOSIS — R0602 Shortness of breath: Secondary | ICD-10-CM | POA: Diagnosis not present

## 2014-06-19 ENCOUNTER — Other Ambulatory Visit: Payer: Self-pay | Admitting: Pharmacist

## 2014-06-19 ENCOUNTER — Encounter: Payer: Self-pay | Admitting: Pharmacist

## 2014-06-19 NOTE — Patient Outreach (Signed)
Called Ms. Maciver's PCP's, Dr. Linton Ham, office to follow up about my message from last week about her multiple sedating medications, including: gabapentin, clonazepam, zolpidem, tramadol and imipramine with Dr. Ginette Pitman and discuss current benefit of these agents and possible alternatives.  Left a message letting the physician know that I have also sent him a letter. I again left my cell phone number, as I did not include it at the bottom of my fax.  Harlow Asa, PharmD Clinical Pharmacist [place] [facility] [phone]

## 2014-06-21 ENCOUNTER — Other Ambulatory Visit: Payer: Self-pay | Admitting: Pharmacist

## 2014-06-21 DIAGNOSIS — Z87440 Personal history of urinary (tract) infections: Secondary | ICD-10-CM | POA: Diagnosis not present

## 2014-06-21 DIAGNOSIS — D649 Anemia, unspecified: Secondary | ICD-10-CM | POA: Diagnosis not present

## 2014-06-21 DIAGNOSIS — R5382 Chronic fatigue, unspecified: Secondary | ICD-10-CM | POA: Diagnosis not present

## 2014-06-21 DIAGNOSIS — M797 Fibromyalgia: Secondary | ICD-10-CM | POA: Diagnosis not present

## 2014-06-21 DIAGNOSIS — I5022 Chronic systolic (congestive) heart failure: Secondary | ICD-10-CM | POA: Diagnosis not present

## 2014-06-21 DIAGNOSIS — I714 Abdominal aortic aneurysm, without rupture: Secondary | ICD-10-CM | POA: Diagnosis not present

## 2014-06-21 DIAGNOSIS — M1990 Unspecified osteoarthritis, unspecified site: Secondary | ICD-10-CM | POA: Diagnosis not present

## 2014-06-21 DIAGNOSIS — I2510 Atherosclerotic heart disease of native coronary artery without angina pectoris: Secondary | ICD-10-CM | POA: Diagnosis not present

## 2014-06-21 NOTE — Patient Outreach (Signed)
Received a voicemail from Ms. Reaver letting me know that she has to cancel our appointment for Friday, June 10th. She helps to take care of a neighbor who is having surgery this week and will be unable to meet with me.  Left a message on her voicemail acknowledging the cancellation and asking her to call me back to reschedule. I have also left a voicemail for Nurse Care Manager Janci Minor letting her know of the cancellation, as this was to be a joint visit. If I have not heard from Ms. Ganser by 06/26/14, will follow up with her again at that time.  Harlow Asa, PharmD Clinical Pharmacist [place] [facility] [phone]

## 2014-06-23 ENCOUNTER — Ambulatory Visit: Payer: Commercial Managed Care - HMO | Admitting: Pharmacist

## 2014-06-23 ENCOUNTER — Other Ambulatory Visit: Payer: Self-pay | Admitting: Pharmacist

## 2014-06-23 NOTE — Patient Outreach (Signed)
Received a call back from [name] [name]'s PCP, Dr. Ginette Pitman, in response to my letter about her multiple sedating medications, including: gabapentin, clonazepam, zolpidem, tramadol and imipramine.   Let Dr, Ginette Pitman know that I did discuss these medications with the patient and that she reports that she has had no falls and has "been on these medications for years". Dr. Ginette Pitman reports that he has received resistance from the patient when he has tried to discuss these in the past. Discussed with Dr. Ginette Pitman alternative strategies, such as the use of trazadone. Dr. Ginette Pitman states that he will discuss these sedating medications, particularly the patient's clonazepam and zolpidem with [name] [name] at their upcoming appointment next week.   Harlow Asa, PharmD Clinical Pharmacist [place] [facility] [phone]

## 2014-06-26 ENCOUNTER — Other Ambulatory Visit: Payer: Self-pay | Admitting: Pharmacist

## 2014-06-26 NOTE — Patient Outreach (Signed)
Called to follow up with [name] [name] about rescheduling our appointment. [name] [name] reports that she is still caring for her neighbor who is in the hospital at [facility] who has now been moved to hospice. Reports that she will continue to be going over to [facility] every day while he is there, but will be home on Wednesday afternoon, as she has an appointment with her PCP that morning. Reports that she does not yet know when he will be out of the hospital. Will follow up with Ms. Traynham on 06/28/14 to reschedule.  Harlow Asa, PharmD Clinical Pharmacist [place] [facility] [phone]

## 2014-06-28 ENCOUNTER — Other Ambulatory Visit: Payer: Self-pay | Admitting: Pharmacist

## 2014-06-28 DIAGNOSIS — M797 Fibromyalgia: Secondary | ICD-10-CM | POA: Diagnosis not present

## 2014-06-28 DIAGNOSIS — I2510 Atherosclerotic heart disease of native coronary artery without angina pectoris: Secondary | ICD-10-CM | POA: Diagnosis not present

## 2014-06-28 DIAGNOSIS — Z87440 Personal history of urinary (tract) infections: Secondary | ICD-10-CM | POA: Diagnosis not present

## 2014-06-28 DIAGNOSIS — M1990 Unspecified osteoarthritis, unspecified site: Secondary | ICD-10-CM | POA: Diagnosis not present

## 2014-06-28 NOTE — Patient Outreach (Signed)
Called to follow up with [name] [name] about rescheduling our appointment. [name] [name] reports that she is still caring for her neighbor who is in the hospital at [facility] who has now been moved to hospice. Reports that she will continue to be unavailable for now as she is spending everyday with him at hospice for at least the next week, as he becomes upset and agitated when she is not there  [name] [name] was home today to attend her appointment with her PCP this morning. Reports that her PCP, Dr. Ginette Pitman, did stop her zolpidem and start her on trazodone instead. Reports that she went and picked up the prescription today.   [name] [name] states that she has my phone number written down on her calendar and will give me a call as soon as she knows of a day when she will be home. Let patient know that if I have not heard from her by 07/07/14, I will follow up with her again at that time.  Harlow Asa, PharmD Clinical Pharmacist [place] [facility] [phone]

## 2014-07-10 ENCOUNTER — Other Ambulatory Visit: Payer: Self-pay | Admitting: Pharmacist

## 2014-07-10 NOTE — Patient Outreach (Signed)
Called to follow up with [name] [name] about rescheduling our appointment. Left a HIPAA compliant message on the patient's voicemail. If have not heard from patient by 07/19/14, will give her another call at that time.  Harlow Asa, PharmD Clinical Pharmacist [place] [facility] [phone]

## 2014-07-17 DIAGNOSIS — I5022 Chronic systolic (congestive) heart failure: Secondary | ICD-10-CM | POA: Diagnosis not present

## 2014-07-17 DIAGNOSIS — R0602 Shortness of breath: Secondary | ICD-10-CM | POA: Diagnosis not present

## 2014-07-19 ENCOUNTER — Other Ambulatory Visit: Payer: Self-pay | Admitting: Pharmacist

## 2014-07-19 NOTE — Patient Outreach (Signed)
Called again, attempt #2, to follow up with Ms. Osinski about rescheduling our appointment. Left a HIPAA compliant message on the patient's voicemail. If have not heard from patient by 07/21/14, will give her another call at that time.  Harlow Asa, PharmD Clinical Pharmacist [place] [facility] [phone]

## 2014-07-24 ENCOUNTER — Other Ambulatory Visit: Payer: Self-pay | Admitting: Pharmacist

## 2014-07-24 ENCOUNTER — Encounter: Payer: Self-pay | Admitting: Pharmacist

## 2014-07-24 NOTE — Patient Outreach (Signed)
Called again, attempt #3, to follow up with Ms. Mathey about rescheduling our appointment. Left a HIPAA compliant message on the patient's voicemail.   I will send patient outreach letter to attempt contact. If I have not heard back from Ms. Cafarelli within 10 days, will close pharmacy episode.  Harlow Asa, PharmD Clinical Pharmacist [place] [facility] [phone]

## 2014-08-09 ENCOUNTER — Encounter: Payer: Self-pay | Admitting: Pharmacist

## 2014-08-09 NOTE — Patient Outreach (Signed)
SHANECIA HOGANSON was referred to me for medication management. Made three phone call attempts to reschedule our original appointment and sent letter. No response within 10 days. Will now close pharmacy episode.  Harlow Asa, PharmD Clinical Pharmacist [place] [facility] [phone]

## 2014-08-11 DIAGNOSIS — I2510 Atherosclerotic heart disease of native coronary artery without angina pectoris: Secondary | ICD-10-CM | POA: Diagnosis not present

## 2014-08-11 DIAGNOSIS — I5022 Chronic systolic (congestive) heart failure: Secondary | ICD-10-CM | POA: Diagnosis not present

## 2014-08-11 DIAGNOSIS — I714 Abdominal aortic aneurysm, without rupture: Secondary | ICD-10-CM | POA: Diagnosis not present

## 2014-08-11 DIAGNOSIS — E782 Mixed hyperlipidemia: Secondary | ICD-10-CM | POA: Diagnosis not present

## 2014-08-17 DIAGNOSIS — I5022 Chronic systolic (congestive) heart failure: Secondary | ICD-10-CM | POA: Diagnosis not present

## 2014-08-17 DIAGNOSIS — R0602 Shortness of breath: Secondary | ICD-10-CM | POA: Diagnosis not present

## 2014-08-29 DIAGNOSIS — I5022 Chronic systolic (congestive) heart failure: Secondary | ICD-10-CM | POA: Diagnosis not present

## 2014-08-29 DIAGNOSIS — I2510 Atherosclerotic heart disease of native coronary artery without angina pectoris: Secondary | ICD-10-CM | POA: Diagnosis not present

## 2014-09-17 DIAGNOSIS — I5022 Chronic systolic (congestive) heart failure: Secondary | ICD-10-CM | POA: Diagnosis not present

## 2014-09-17 DIAGNOSIS — R0602 Shortness of breath: Secondary | ICD-10-CM | POA: Diagnosis not present

## 2014-09-20 DIAGNOSIS — R195 Other fecal abnormalities: Secondary | ICD-10-CM | POA: Diagnosis not present

## 2014-09-20 DIAGNOSIS — I5022 Chronic systolic (congestive) heart failure: Secondary | ICD-10-CM | POA: Diagnosis not present

## 2014-09-20 DIAGNOSIS — R55 Syncope and collapse: Secondary | ICD-10-CM | POA: Diagnosis not present

## 2014-09-20 DIAGNOSIS — I2510 Atherosclerotic heart disease of native coronary artery without angina pectoris: Secondary | ICD-10-CM | POA: Diagnosis not present

## 2014-09-20 DIAGNOSIS — J449 Chronic obstructive pulmonary disease, unspecified: Secondary | ICD-10-CM | POA: Diagnosis not present

## 2014-09-20 DIAGNOSIS — R300 Dysuria: Secondary | ICD-10-CM | POA: Diagnosis not present

## 2014-09-20 DIAGNOSIS — M797 Fibromyalgia: Secondary | ICD-10-CM | POA: Diagnosis not present

## 2014-09-20 DIAGNOSIS — R110 Nausea: Secondary | ICD-10-CM | POA: Diagnosis not present

## 2014-10-05 DIAGNOSIS — N390 Urinary tract infection, site not specified: Secondary | ICD-10-CM | POA: Diagnosis not present

## 2014-10-05 DIAGNOSIS — B9620 Unspecified Escherichia coli [E. coli] as the cause of diseases classified elsewhere: Secondary | ICD-10-CM | POA: Diagnosis not present

## 2014-10-17 DIAGNOSIS — R0602 Shortness of breath: Secondary | ICD-10-CM | POA: Diagnosis not present

## 2014-10-17 DIAGNOSIS — I5022 Chronic systolic (congestive) heart failure: Secondary | ICD-10-CM | POA: Diagnosis not present

## 2014-11-02 DIAGNOSIS — D649 Anemia, unspecified: Secondary | ICD-10-CM | POA: Diagnosis not present

## 2014-11-02 DIAGNOSIS — M797 Fibromyalgia: Secondary | ICD-10-CM | POA: Diagnosis not present

## 2014-11-02 DIAGNOSIS — M1990 Unspecified osteoarthritis, unspecified site: Secondary | ICD-10-CM | POA: Diagnosis not present

## 2014-11-02 DIAGNOSIS — I5022 Chronic systolic (congestive) heart failure: Secondary | ICD-10-CM | POA: Diagnosis not present

## 2014-11-02 DIAGNOSIS — F17200 Nicotine dependence, unspecified, uncomplicated: Secondary | ICD-10-CM | POA: Diagnosis not present

## 2014-11-02 DIAGNOSIS — J449 Chronic obstructive pulmonary disease, unspecified: Secondary | ICD-10-CM | POA: Diagnosis not present

## 2014-11-02 DIAGNOSIS — I2510 Atherosclerotic heart disease of native coronary artery without angina pectoris: Secondary | ICD-10-CM | POA: Diagnosis not present

## 2014-11-02 DIAGNOSIS — Z87440 Personal history of urinary (tract) infections: Secondary | ICD-10-CM | POA: Diagnosis not present

## 2014-11-07 DIAGNOSIS — Z0000 Encounter for general adult medical examination without abnormal findings: Secondary | ICD-10-CM | POA: Diagnosis not present

## 2014-11-07 DIAGNOSIS — I2510 Atherosclerotic heart disease of native coronary artery without angina pectoris: Secondary | ICD-10-CM | POA: Diagnosis not present

## 2014-11-07 DIAGNOSIS — Z23 Encounter for immunization: Secondary | ICD-10-CM | POA: Diagnosis not present

## 2014-11-07 DIAGNOSIS — J449 Chronic obstructive pulmonary disease, unspecified: Secondary | ICD-10-CM | POA: Diagnosis not present

## 2014-11-07 DIAGNOSIS — I5022 Chronic systolic (congestive) heart failure: Secondary | ICD-10-CM | POA: Diagnosis not present

## 2014-11-07 DIAGNOSIS — M150 Primary generalized (osteo)arthritis: Secondary | ICD-10-CM | POA: Diagnosis not present

## 2014-11-10 DIAGNOSIS — R404 Transient alteration of awareness: Secondary | ICD-10-CM | POA: Diagnosis not present

## 2014-11-14 DIAGNOSIS — G40909 Epilepsy, unspecified, not intractable, without status epilepticus: Secondary | ICD-10-CM | POA: Diagnosis not present

## 2014-11-17 DIAGNOSIS — R0602 Shortness of breath: Secondary | ICD-10-CM | POA: Diagnosis not present

## 2014-11-17 DIAGNOSIS — I5022 Chronic systolic (congestive) heart failure: Secondary | ICD-10-CM | POA: Diagnosis not present

## 2014-11-30 ENCOUNTER — Other Ambulatory Visit: Payer: Self-pay | Admitting: Neurology

## 2014-11-30 DIAGNOSIS — R404 Transient alteration of awareness: Secondary | ICD-10-CM

## 2014-12-17 DIAGNOSIS — R0602 Shortness of breath: Secondary | ICD-10-CM | POA: Diagnosis not present

## 2014-12-17 DIAGNOSIS — I5022 Chronic systolic (congestive) heart failure: Secondary | ICD-10-CM | POA: Diagnosis not present

## 2014-12-20 ENCOUNTER — Ambulatory Visit
Admission: RE | Admit: 2014-12-20 | Discharge: 2014-12-20 | Disposition: A | Discharge status: Home / Self Care | Payer: Commercial Managed Care - HMO | Source: Ambulatory Visit | Attending: Neurology | Admitting: Neurology

## 2014-12-20 DIAGNOSIS — R569 Unspecified convulsions: Secondary | ICD-10-CM | POA: Diagnosis not present

## 2014-12-20 DIAGNOSIS — R209 Unspecified disturbances of skin sensation: Secondary | ICD-10-CM | POA: Diagnosis not present

## 2014-12-20 DIAGNOSIS — G319 Degenerative disease of nervous system, unspecified: Secondary | ICD-10-CM | POA: Diagnosis not present

## 2014-12-20 DIAGNOSIS — R404 Transient alteration of awareness: Secondary | ICD-10-CM

## 2014-12-20 DIAGNOSIS — I739 Peripheral vascular disease, unspecified: Secondary | ICD-10-CM | POA: Diagnosis not present

## 2014-12-20 MED ORDER — GADOBENATE DIMEGLUMINE 529 MG/ML IV SOLN
15.0000 mL | Freq: Once | INTRAVENOUS | Status: AC | PRN
  Administered 2014-12-20: 11 mL via INTRAVENOUS

## 2015-01-10 DIAGNOSIS — G603 Idiopathic progressive neuropathy: Secondary | ICD-10-CM | POA: Diagnosis not present

## 2015-01-10 DIAGNOSIS — R404 Transient alteration of awareness: Secondary | ICD-10-CM | POA: Diagnosis not present

## 2015-01-17 DIAGNOSIS — R0602 Shortness of breath: Secondary | ICD-10-CM | POA: Diagnosis not present

## 2015-01-17 DIAGNOSIS — I5022 Chronic systolic (congestive) heart failure: Secondary | ICD-10-CM | POA: Diagnosis not present

## 2015-01-26 DIAGNOSIS — M6740 Ganglion, unspecified site: Secondary | ICD-10-CM | POA: Diagnosis not present

## 2015-01-26 DIAGNOSIS — J432 Centrilobular emphysema: Secondary | ICD-10-CM | POA: Diagnosis not present

## 2015-01-26 DIAGNOSIS — R0602 Shortness of breath: Secondary | ICD-10-CM | POA: Diagnosis not present

## 2015-01-26 DIAGNOSIS — M150 Primary generalized (osteo)arthritis: Secondary | ICD-10-CM | POA: Diagnosis not present

## 2015-01-26 DIAGNOSIS — M797 Fibromyalgia: Secondary | ICD-10-CM | POA: Diagnosis not present

## 2015-01-26 DIAGNOSIS — I714 Abdominal aortic aneurysm, without rupture: Secondary | ICD-10-CM | POA: Diagnosis not present

## 2015-01-26 DIAGNOSIS — I2510 Atherosclerotic heart disease of native coronary artery without angina pectoris: Secondary | ICD-10-CM | POA: Diagnosis not present

## 2015-01-26 DIAGNOSIS — C801 Malignant (primary) neoplasm, unspecified: Secondary | ICD-10-CM | POA: Diagnosis not present

## 2015-01-26 DIAGNOSIS — J449 Chronic obstructive pulmonary disease, unspecified: Secondary | ICD-10-CM | POA: Diagnosis not present

## 2015-02-13 DIAGNOSIS — R569 Unspecified convulsions: Secondary | ICD-10-CM | POA: Diagnosis not present

## 2015-02-13 DIAGNOSIS — R404 Transient alteration of awareness: Secondary | ICD-10-CM | POA: Diagnosis not present

## 2015-02-14 DIAGNOSIS — M79645 Pain in left finger(s): Secondary | ICD-10-CM | POA: Diagnosis not present

## 2015-02-14 DIAGNOSIS — M19042 Primary osteoarthritis, left hand: Secondary | ICD-10-CM | POA: Diagnosis not present

## 2015-02-14 DIAGNOSIS — M6740 Ganglion, unspecified site: Secondary | ICD-10-CM | POA: Diagnosis not present

## 2015-02-17 DIAGNOSIS — R0602 Shortness of breath: Secondary | ICD-10-CM | POA: Diagnosis not present

## 2015-02-17 DIAGNOSIS — I5022 Chronic systolic (congestive) heart failure: Secondary | ICD-10-CM | POA: Diagnosis not present

## 2015-02-20 ENCOUNTER — Inpatient Hospital Stay: Admission: RE | Admit: 2015-02-20 | Payer: Commercial Managed Care - HMO | Source: Ambulatory Visit

## 2015-02-21 ENCOUNTER — Encounter
Admission: RE | Admit: 2015-02-21 | Discharge: 2015-02-21 | Disposition: A | Discharge status: Home / Self Care | Payer: Commercial Managed Care - HMO | Source: Ambulatory Visit | Attending: Orthopedic Surgery | Admitting: Orthopedic Surgery

## 2015-02-21 DIAGNOSIS — Z01812 Encounter for preprocedural laboratory examination: Secondary | ICD-10-CM | POA: Insufficient documentation

## 2015-02-21 HISTORY — DX: Chronic obstructive pulmonary disease, unspecified: J44.9

## 2015-02-21 HISTORY — DX: Polyp of vocal cord and larynx: J38.1

## 2015-02-21 HISTORY — DX: Dizziness and giddiness: R42

## 2015-02-21 HISTORY — DX: Dependence on supplemental oxygen: Z99.81

## 2015-02-21 HISTORY — DX: Ataxic gait: R26.0

## 2015-02-21 HISTORY — DX: Chronic sinusitis, unspecified: J32.9

## 2015-02-21 HISTORY — DX: Urinary tract infection, site not specified: N39.0

## 2015-02-21 HISTORY — DX: Fibromyalgia: M79.7

## 2015-02-21 HISTORY — DX: Polyneuropathy, unspecified: G62.9

## 2015-02-21 HISTORY — DX: Other abnormalities of gait and mobility: R26.89

## 2015-02-21 HISTORY — DX: Rheumatoid arthritis, unspecified: M06.9

## 2015-02-21 LAB — BASIC METABOLIC PANEL
Anion gap: 8 (ref 5–15)
BUN: 10 mg/dL (ref 6–20)
CHLORIDE: 91 mmol/L — AB (ref 101–111)
CO2: 28 mmol/L (ref 22–32)
CREATININE: 0.89 mg/dL (ref 0.44–1.00)
Calcium: 9 mg/dL (ref 8.9–10.3)
GFR calc non Af Amer: >60 mL/min (ref >60)
Glucose, Bld: 102 mg/dL — ABNORMAL HIGH (ref 65–99)
POTASSIUM: 4.3 mmol/L (ref 3.5–5.1)
SODIUM: 127 mmol/L — AB (ref 135–145)

## 2015-02-21 LAB — CBC
HEMATOCRIT: 37.5 % (ref 35.0–47.0)
HEMOGLOBIN: 13 g/dL (ref 12.0–16.0)
MCH: 31.5 pg (ref 26.0–34.0)
MCHC: 34.6 g/dL (ref 32.0–36.0)
MCV: 90.9 fL (ref 80.0–100.0)
Platelets: 194 10*3/uL (ref 150–440)
RBC: 4.12 MIL/uL (ref 3.80–5.20)
RDW: 13.8 % (ref 11.5–14.5)
WBC: 5.8 10*3/uL (ref 3.6–11.0)

## 2015-02-21 LAB — SURGICAL PCR SCREEN
MRSA, PCR: NEGATIVE
Staphylococcus aureus: NEGATIVE

## 2015-02-21 NOTE — Pre-Procedure Instructions (Signed)
Narrative                     CARDIOLOGY DEPARTMENT                         [place], [place]                            [id number]                   [facility]                       Acct #: [id number]         [address] [place], [place] [address]             Date: 03/09/2014 11:15 AM                                                                  Adult Female Age: 75 yrs                    ECHOCARDIOGRAM REPORT                         Outpatient           STUDY:Stress Echo        TAPE:                         KC::KCWC            ECHO:Yes   DOPPLER:Yes  FILE:[id number]             MD1:  [name], [name]           COLOR:Yes  CONTRAST:No      MACHINE:Philips            Height: 61 in       RV BIOPSY:No         3D:No   SOUND QLTY:Moderate           Weight: 122 lb          MEDIUM:None                                                                  BSA: 1.5 m2 ___________________________________________________________________________________________                   HISTORY:Chest pain                    REASON:Assess, LV function                Indication:I20.9 Ischemic chest pain  ___________________________________________________________________________________________ STRESS ECHOCARDIOGRAPHY           Protocol:Treadmill (Modified Bruce)  Drugs:None Target Heart Rate:127 bpm        Maximum Predicted Heart Rate: 149 bpm  +-------------------+-------------------------+-------------------------+------------+        Stage            Duration (mm:ss)         Heart Rate (bpm)         BP      +-------------------+-------------------------+-------------------------+------------+       RESTING                                           89              115/64    +-------------------+-------------------------+-------------------------+------------+       EXERCISE                3:40                     134                 /       +-------------------+-------------------------+-------------------------+------------+       RECOVERY                9:13                      93              134/74    +-------------------+-------------------------+-------------------------+------------+    Stress Duration:3:40 mm:ss   Max Stress H.R.:134 bpm        Target Heart Rate Achieved: Yes   ___________________________________________________________________________________________ WALL SEGMENT CHANGES                        Rest          Stress Anterior Septum Basal:HYPOKINETIC   HYPOKINETIC                   Mid:HYPOKINETIC   HYPOKINETIC                Apical:HYPOKINETIC   HYPOKINETIC    Anterior Wall Basal:HYPOKINETIC   HYPOKINETIC                   Mid:HYPOKINETIC   HYPOKINETIC                Apical:HYPOKINETIC   HYPOKINETIC     Lateral Wall Basal:HYPOKINETIC   HYPOKINETIC                   Mid:HYPOKINETIC   HYPOKINETIC                Apical:HYPOKINETIC   HYPOKINETIC   Posterior Wall Basal:HYPOKINETIC   HYPOKINETIC                   Mid:HYPOKINETIC   HYPOKINETIC    Inferior Wall Basal:HYPOKINETIC   HYPOKINETIC                   Mid:HYPOKINETIC   HYPOKINETIC                Apical:HYPOKINETIC   HYPOKINETIC  Inferior Septum Basal:HYPOKINETIC   HYPOKINETIC                   Mid:HYPOKINETIC   HYPOKINETIC             Resting  EF:25% (Est.)    Stress EF: 25% (Est.)   ___________________________________________________________________________________________ ADDITIONAL FINDINGS    Chest Discomfort:None         Arrhythmia:None Termination Reason:Fatigue    Adverse Effects:None      Complications:None   ___________________________________________________________________________________________ STRESS ECG RESULTS                                               ECG Results:Normal  ___________________________________________________________________________________________  ECHOCARDIOGRAPHIC  DESCRIPTIONS  LEFT VENTRICLE         Size:Normal  Contraction:SEVERE GLOBAL DECREASE    LV Masses:No Masses          EHU:DJSH Dias.FxClass:Normal  RIGHT VENTRICLE         Size:Normal               Free Wall:Normal  Contraction:Normal               RV Masses:No mass  PERICARDIUM        Fluid:No effusion  _______________________________________________________________________________________  DOPPLER ECHO and OTHER SPECIAL PROCEDURES    Aortic:MILD AR                No AS     Mitral:MODERATE MR            No MS           MV Inflow E Vel=nm*    MV Annulus E'Vel=nm*           E/E'Ratio=nm*  Tricuspid:TRIVIAL TR             No TS  Pulmonary:TRIVIAL PR             No PS   ___________________________________________________________________________________________  ECHOCARDIOGRAPHIC MEASUREMENTS 2D DIMENSIONS AORTA             Values      Normal Range      MAIN PA          Values      Normal Range           Annulus:  nm*       [2.1 - 2.5]                PA Main:  nm*       [1.5 - 2.1]         Aorta Sin:  nm*       [2.7 - 3.3]       RIGHT VENTRICLE       ST Junction:  nm*       [2.3 - 2.9]                RV Base:  nm*       [ < 4.2]         Asc.Aorta:  nm*       [2.3 - 3.1]                 RV Mid:  nm*       [ < 3.5]  LEFT VENTRICLE                                        RV Length:  nm*       [ < 8.6]  LVIDd:  5.0 cm    [3.9 - 5.3]       INFERIOR VENA CAVA             LVIDs:  4.8 cm                              Max. IVC:  nm*       [ <= 2.1]                FS:  4.8 %     [> 25]                    Min. IVC:  nm*               SWT:  0.95 cm   [0.5 - 0.9]                   ------------------               PWT:  0.92 cm   [0.5 - 0.9]                   nm* - not measured  LEFT ATRIUM           LA Diam:  nm*       [2.7 - 3.8]       LA A4C Area:  nm*       [ < 20]         LA Volume:  nm*       [22 -  52]  ___________________________________________________________________________________________ INTERPRETATION ABNORMAL STRESS ECHOCARDIOGRAM   ABNORMAL RESTING STUDY, NO CHANGE WITH STRESS NORMAL RIGHT VENTRICULAR SYSTOLIC FUNCTION MODERATE VALVULAR REGURGITATION (See above) NO VALVULAR STENOSIS NOTED   ___________________________________________________________________________________________  Electronically signed by: MD Serafina Royals on 03/10/2014 08:56 AM             Performed By: Johnathan Hausen, [place], RVT       Ordering Physician: Serafina Royals

## 2015-02-21 NOTE — Patient Instructions (Signed)
  Your procedure is scheduled on: 02/27/15 Tues Report to Day Surgery.2nd floor medical mall To find out your arrival time please call [phone] between 1PM - 3PM on 02/26/15 Mon.  Remember: Instructions that are not followed completely may result in serious medical risk, up to and including death, or upon the discretion of your surgeon and anesthesiologist your surgery may need to be rescheduled.    _x___ 1. Do not eat food or drink liquids after midnight. No gum chewing or hard candies.     ____ 2. No Alcohol for 24 hours before or after surgery.   ____ 3. Bring all medications with you on the day of surgery if instructed.    _x___ 4. Notify your doctor if there is any change in your medical condition     (cold, fever, infections).     Do not wear jewelry, make-up, hairpins, clips or nail polish.  Do not wear lotions, powders, or perfumes. You may wear deodorant.  Do not shave 48 hours prior to surgery. Men may shave face and neck.  Do not bring valuables to the hospital.    [facility] is not responsible for any belongings or valuables.               Contacts, dentures or bridgework may not be worn into surgery.  Leave your suitcase in the car. After surgery it may be brought to your room.  For patients admitted to the hospital, discharge time is determined by your                treatment team.   Patients discharged the day of surgery will not be allowed to drive home.   Please read over the following fact sheets that you were given:   MRSA Information   _x___ Take these medicines the morning of surgery with A SIP OF WATER:    1.albuterol-ipratropium (COMBIVENT) 18-103 MCG/ACT inhaler  2. budesonide-formoterol (SYMBICORT) 160-4.5 MCG/ACT inhaler  3. carvedilol (COREG) 6.25 MG tablet  4.gabapentin (NEURONTIN) 600 MG tablet  5.lisinopril (PRINIVIL,ZESTRIL) 2.5 MG tablet  6.traMADol (ULTRAM) 50 MG tablet  ____ Fleet Enema (as directed)   _x___ Use CHG Soap as  directed  ____ Use inhalers on the day of surgery  ____ Stop metformin 2 days prior to surgery    ____ Take 1/2 of usual insulin dose the night before surgery and none on the morning of surgery.   ____ Stop Coumadin/Plavix/aspirin on   ____ Stop Anti-inflammatories on    ____ Stop supplements until after surgery.    ____ Bring C-Pap to the hospital.

## 2015-02-21 NOTE — Pre-Procedure Instructions (Signed)
Value Range  LV Ejection Fraction (%) 35   Aortic Valve Regurgitation Grade mild   Aortic Valve Stenosis Grade none   Aortic Valve Max Velocity (m/s) 1.8 m/sec   Aortic Valve Stenosis Mean Gradient (mmHg) 7.5 mmHg   Mitral Valve Regurgitation Grade moderate   Mitral Valve Stenosis Grade none   Tricuspid Valve Regurgitation Grade trivial   Tricuspid Valve Regurgitation Max Velocity (m/s) 2 m/sec   Right Ventricle Systolic Pressure (mmHg) 32.3 mmHg   LV End Diastolic Diameter (cm) 4.3 cm  LV End Systolic Diameter (cm) 3.7 cm  LV Septum Wall Thickness (cm) 1.3 cm  LV Posterior Wall Thickness (cm) 1.1 cm  Left Atrium Diameter (cm) 3.2 cm   Result Narrative                      CARDIOLOGY DEPARTMENT                        [facility]                           [id number]                   [facility]                       Acct #: [id number]         [address], [place], [place] [address]             Date: 08/29/2014 01:16 PM                                                                  Adult Female Age: 75 yrs                     ECHOCARDIOGRAM REPORT                        Outpatient          STUDY:CHEST WALL         TAPE:0000:00: 0:00:00          KC::KCWC           ECHO:Yes   DOPPLER:Yes  FILE:[id number]              MD1:  [name], [name]          COLOR:Yes  CONTRAST:No       MACHINE:Philips            Height: 61 in      RV BIOPSY:No         3D:No    SOUND QLTY:Moderate           Weight: 124 lb         MEDIUM:None  BSA: 1.5 m2 ___________________________________________________________________________________________        HISTORY:CHF         REASON:Assess, LV function     FTDDUKGURK:Y70.62 Chronic systolic CHF (congestive heart failure)  ___________________________________________________________________________________________ ECHOCARDIOGRAPHIC  MEASUREMENTS 2D DIMENSIONS AORTA             Values      Normal Range      MAIN PA          Values      Normal Range           Annulus:  nm*       [2.1 - 2.5]                PA Main:  nm*       [1.5 - 2.1]         Aorta Sin:  nm*       [2.7 - 3.3]       RIGHT VENTRICLE       ST Junction:  nm*       [2.3 - 2.9]                RV Base:  3.0 cm    [ < 4.2]         Asc.Aorta:  nm*       [2.3 - 3.1]                 RV Mid:  nm*       [ < 3.5]  LEFT VENTRICLE                                        RV Length:  nm*       [ < 8.6]             LVIDd:  4.3 cm    [3.9 - 5.3]       INFERIOR VENA CAVA             LVIDs:  3.7 cm                              Max. IVC:  nm*       [ <= 2.1]                FS:  14.4 %    [> 25]                    Min. IVC:  nm*               SWT:  1.3 cm    [0.5 - 0.9]                   ------------------               PWT:  1.1 cm    [0.5 - 0.9]                   nm* - not measured  LEFT ATRIUM           LA Diam:  3.2 cm    [2.7 - 3.8]       LA A4C Area:  nm*       [ < 20]         LA Volume:  nm*       [  64 - 52]  ___________________________________________________________________________________________ ECHOCARDIOGRAPHIC DESCRIPTIONS  AORTIC ROOT         Size:Normal   Dissection:No dissection  AORTIC VALVE     Leaflets:Tricuspid            Morphology:MILDLY THICKENED     Mobility:PARTIALLY MOBILE  LEFT VENTRICLE         Size:Normal                 Anterior:HYPOCONTRACTILE  Contraction:MOD GLOBAL DECREASE     Lateral:HYPOCONTRACTILE   Closest EF:35% (Estimated)          Septal:HYPOCONTRACTILE    LV Masses:No Masses                Apical:HYPOCONTRACTILE          GYI:RSWN                   Inferior:HYPOCONTRACTILE                                    Posterior:HYPOCONTRACTILE Dias.FxClass:(Grade 1) relaxation abnormal, E/A reversal  MITRAL VALVE     Leaflets:Normal                 Mobility:Fully mobile   Morphology:Normal  LEFT ATRIUM         Size:Normal                 LA Masses:No masses    IA Septum:Normal IAS  MAIN PA         Size:Normal  PULMONIC VALVE   Morphology:Normal                 Mobility:Fully mobile  RIGHT VENTRICLE    RV Masses:No Masses                  Size:Normal    Free Wall:Normal              Contraction:Normal  TRICUSPID VALVE     Leaflets:Normal                 Mobility:Fully mobile   Morphology:Normal  RIGHT ATRIUM         Size:Normal                 RA Other:None      RA Mass:No masses  PERICARDIUM        Fluid:No effusion  INFERIOR VENACAVA         Size:Normal Normal respiratory collapse   ____________________________________________________________________ DOPPLER ECHO and OTHER SPECIAL PROCEDURES    Aortic:MILD AR                       No AS           183.5 cm/sec peak vel         13.5 mmHg peak grad           7.5 mmHg mean grad            1.7 cm^2 by DOPPLER     Mitral:MODERATE MR                   No MS                                         2.3 cm^2 by DOPPLER  MV Inflow E Vel=49.9 cm/sec   MV Annulus E'Vel=3.5 cm/sec           E/E'Ratio=14.3  Tricuspid:TRIVIAL TR                    No TS           198.0 cm/sec peak TR vel      20.7 mmHg peak RV pressure  Pulmonary:TRIVIAL PR                    No PS     ___________________________________________________________________________________________ INTERPRETATION MODERATE LV SYSTOLIC DYSFUNCTION WITH AN ESTIMATED EF = 35% NORMAL RIGHT VENTRICULAR SYSTOLIC FUNCTION MODERATE MITRAL VALVE INSUFFICIENCY MILD AORTIC VALVE INSUFFICIENCY TRACE TRICUSPID VALVE INSUFFICIENCY NO VALVULAR STENOSIS SCLEROTIC AORTIC VALVE   ___________________________________________________________________________________________ Electronically signed by: MD Serafina Royals on 08/29/2014 02:26 PM             Performed By: Johnathan Hausen, [place], RVT       Ordering Physician: Serafina Royals

## 2015-02-27 ENCOUNTER — Encounter: Payer: Self-pay | Admitting: *Deleted

## 2015-02-27 ENCOUNTER — Encounter
Admission: RE | Disposition: A | Discharge status: Home / Self Care | Payer: Self-pay | Source: Ambulatory Visit | Attending: Orthopedic Surgery

## 2015-02-27 ENCOUNTER — Ambulatory Visit
Admission: RE | Admit: 2015-02-27 | Discharge: 2015-02-27 | Disposition: A | Discharge status: Home / Self Care | Payer: Commercial Managed Care - HMO | Source: Ambulatory Visit | Attending: Orthopedic Surgery | Admitting: Orthopedic Surgery

## 2015-02-27 ENCOUNTER — Ambulatory Visit: Payer: Commercial Managed Care - HMO | Admitting: Anesthesiology

## 2015-02-27 DIAGNOSIS — Z800 Family history of malignant neoplasm of digestive organs: Secondary | ICD-10-CM | POA: Diagnosis not present

## 2015-02-27 DIAGNOSIS — Z8541 Personal history of malignant neoplasm of cervix uteri: Secondary | ICD-10-CM | POA: Insufficient documentation

## 2015-02-27 DIAGNOSIS — M1990 Unspecified osteoarthritis, unspecified site: Secondary | ICD-10-CM | POA: Diagnosis not present

## 2015-02-27 DIAGNOSIS — Z79899 Other long term (current) drug therapy: Secondary | ICD-10-CM | POA: Diagnosis not present

## 2015-02-27 DIAGNOSIS — Z853 Personal history of malignant neoplasm of breast: Secondary | ICD-10-CM | POA: Insufficient documentation

## 2015-02-27 DIAGNOSIS — M797 Fibromyalgia: Secondary | ICD-10-CM | POA: Insufficient documentation

## 2015-02-27 DIAGNOSIS — G629 Polyneuropathy, unspecified: Secondary | ICD-10-CM | POA: Diagnosis not present

## 2015-02-27 DIAGNOSIS — Z882 Allergy status to sulfonamides status: Secondary | ICD-10-CM | POA: Diagnosis not present

## 2015-02-27 DIAGNOSIS — Z91048 Other nonmedicinal substance allergy status: Secondary | ICD-10-CM | POA: Insufficient documentation

## 2015-02-27 DIAGNOSIS — I10 Essential (primary) hypertension: Secondary | ICD-10-CM | POA: Diagnosis not present

## 2015-02-27 DIAGNOSIS — Z823 Family history of stroke: Secondary | ICD-10-CM | POA: Diagnosis not present

## 2015-02-27 DIAGNOSIS — Z90710 Acquired absence of both cervix and uterus: Secondary | ICD-10-CM | POA: Insufficient documentation

## 2015-02-27 DIAGNOSIS — I509 Heart failure, unspecified: Secondary | ICD-10-CM | POA: Insufficient documentation

## 2015-02-27 DIAGNOSIS — Z9012 Acquired absence of left breast and nipple: Secondary | ICD-10-CM | POA: Insufficient documentation

## 2015-02-27 DIAGNOSIS — J45909 Unspecified asthma, uncomplicated: Secondary | ICD-10-CM | POA: Diagnosis not present

## 2015-02-27 DIAGNOSIS — Z833 Family history of diabetes mellitus: Secondary | ICD-10-CM | POA: Diagnosis not present

## 2015-02-27 DIAGNOSIS — E785 Hyperlipidemia, unspecified: Secondary | ICD-10-CM | POA: Insufficient documentation

## 2015-02-27 DIAGNOSIS — M67442 Ganglion, left hand: Secondary | ICD-10-CM | POA: Diagnosis not present

## 2015-02-27 DIAGNOSIS — G479 Sleep disorder, unspecified: Secondary | ICD-10-CM | POA: Insufficient documentation

## 2015-02-27 DIAGNOSIS — N3090 Cystitis, unspecified without hematuria: Secondary | ICD-10-CM | POA: Diagnosis not present

## 2015-02-27 DIAGNOSIS — M810 Age-related osteoporosis without current pathological fracture: Secondary | ICD-10-CM | POA: Diagnosis not present

## 2015-02-27 DIAGNOSIS — Z91041 Radiographic dye allergy status: Secondary | ICD-10-CM | POA: Diagnosis not present

## 2015-02-27 DIAGNOSIS — Z881 Allergy status to other antibiotic agents status: Secondary | ICD-10-CM | POA: Diagnosis not present

## 2015-02-27 DIAGNOSIS — Z9981 Dependence on supplemental oxygen: Secondary | ICD-10-CM | POA: Insufficient documentation

## 2015-02-27 DIAGNOSIS — Z808 Family history of malignant neoplasm of other organs or systems: Secondary | ICD-10-CM | POA: Diagnosis not present

## 2015-02-27 DIAGNOSIS — F17200 Nicotine dependence, unspecified, uncomplicated: Secondary | ICD-10-CM | POA: Insufficient documentation

## 2015-02-27 DIAGNOSIS — M25842 Other specified joint disorders, left hand: Secondary | ICD-10-CM | POA: Insufficient documentation

## 2015-02-27 DIAGNOSIS — H9191 Unspecified hearing loss, right ear: Secondary | ICD-10-CM | POA: Insufficient documentation

## 2015-02-27 DIAGNOSIS — Z8249 Family history of ischemic heart disease and other diseases of the circulatory system: Secondary | ICD-10-CM | POA: Diagnosis not present

## 2015-02-27 HISTORY — PX: CYST EXCISION: SHX5701

## 2015-02-27 SURGERY — CYST REMOVAL
Anesthesia: Monitor Anesthesia Care | Laterality: Left | Wound class: Clean

## 2015-02-27 MED ORDER — ONDANSETRON HCL 4 MG/2ML IJ SOLN: 4.0000 mg | Freq: Once | INTRAMUSCULAR | Status: DC | PRN

## 2015-02-27 MED ORDER — FAMOTIDINE 20 MG PO TABS
ORAL_TABLET | ORAL | Status: AC
  Filled 2015-02-27: qty 1

## 2015-02-27 MED ORDER — FENTANYL CITRATE (PF) 100 MCG/2ML IJ SOLN
25.0000 ug | INTRAMUSCULAR | Status: DC | PRN
  Administered 2015-02-27 (×4): 25 ug via INTRAVENOUS

## 2015-02-27 MED ORDER — PHENYLEPHRINE HCL 10 MG/ML IJ SOLN
INTRAMUSCULAR | Status: DC | PRN
  Administered 2015-02-27 (×2): 100 ug via INTRAVENOUS

## 2015-02-27 MED ORDER — BUPIVACAINE HCL (PF) 0.5 % IJ SOLN
INTRAMUSCULAR | Status: DC | PRN
Start: 2015-02-27 — End: 2015-02-27
  Administered 2015-02-27: 8 mL

## 2015-02-27 MED ORDER — FAMOTIDINE 20 MG PO TABS
20.0000 mg | ORAL_TABLET | Freq: Once | ORAL | Status: AC
  Administered 2015-02-27: 20 mg via ORAL

## 2015-02-27 MED ORDER — BUPIVACAINE HCL (PF) 0.5 % IJ SOLN
INTRAMUSCULAR | Status: AC
  Filled 2015-02-27: qty 30

## 2015-02-27 MED ORDER — LIDOCAINE HCL (PF) 1 % IJ SOLN
INTRAMUSCULAR | Status: AC
  Filled 2015-02-27: qty 30

## 2015-02-27 MED ORDER — LACTATED RINGERS IV SOLN
INTRAVENOUS | Status: DC
  Administered 2015-02-27: 13:00:00 via INTRAVENOUS

## 2015-02-27 MED ORDER — HYDROCODONE-ACETAMINOPHEN 5-325 MG PO TABS: 1.0000 | ORAL_TABLET | Freq: Four times a day (QID) | ORAL | Status: DC | PRN

## 2015-02-27 MED ORDER — EPHEDRINE SULFATE 50 MG/ML IJ SOLN
INTRAMUSCULAR | Status: DC | PRN
  Administered 2015-02-27 (×2): 10 mg via INTRAVENOUS

## 2015-02-27 MED ORDER — FENTANYL CITRATE (PF) 100 MCG/2ML IJ SOLN
INTRAMUSCULAR | Status: DC | PRN
  Administered 2015-02-27: 50 ug via INTRAVENOUS

## 2015-02-27 MED ORDER — FENTANYL CITRATE (PF) 100 MCG/2ML IJ SOLN
INTRAMUSCULAR | Status: AC
  Administered 2015-02-27: 25 ug via INTRAVENOUS
  Filled 2015-02-27: qty 2

## 2015-02-27 MED ORDER — LIDOCAINE HCL (CARDIAC) 20 MG/ML IV SOLN
INTRAVENOUS | Status: DC | PRN
  Administered 2015-02-27: 40 mg via INTRAVENOUS

## 2015-02-27 MED ORDER — PROPOFOL 10 MG/ML IV BOLUS
INTRAVENOUS | Status: DC | PRN
  Administered 2015-02-27: 100 mg via INTRAVENOUS

## 2015-02-27 SURGICAL SUPPLY — 28 items
BANDAGE ACE 3X5.8 VEL STRL LF (GAUZE/BANDAGES/DRESSINGS) ×2 IMPLANT
BNDG ESMARK 4X12 TAN STRL LF (GAUZE/BANDAGES/DRESSINGS) IMPLANT
CANISTER SUCT 1200ML W/VALVE (MISCELLANEOUS) ×2 IMPLANT
CHLORAPREP W/TINT 26ML (MISCELLANEOUS) ×2 IMPLANT
CORD BIP STRL DISP 12FT (MISCELLANEOUS) IMPLANT
ELECT CAUTERY BLADE 6.4 (BLADE) ×2 IMPLANT
FORCEPS JEWEL BIP 4-3/4 STR (INSTRUMENTS) IMPLANT
GAUZE PETRO XEROFOAM 1X8 (MISCELLANEOUS) ×2 IMPLANT
GAUZE SPONGE 4X4 12PLY STRL (GAUZE/BANDAGES/DRESSINGS) ×2 IMPLANT
GLOVE BIOGEL PI IND STRL 9 (GLOVE) ×1 IMPLANT
GLOVE BIOGEL PI INDICATOR 9 (GLOVE) ×1
GLOVE SURG ORTHO 9.0 STRL STRW (GLOVE) ×2 IMPLANT
GOWN SPECIALTY ULTRA XL (MISCELLANEOUS) IMPLANT
GOWN STRL REUS W/ TWL LRG LVL3 (GOWN DISPOSABLE) ×1 IMPLANT
GOWN STRL REUS W/TWL LRG LVL3 (GOWN DISPOSABLE) ×1
KIT RM TURNOVER STRD PROC AR (KITS) ×2 IMPLANT
NEEDLE HYPO 25X1 1.5 SAFETY (NEEDLE) ×2 IMPLANT
NS IRRIG 500ML POUR BTL (IV SOLUTION) ×2 IMPLANT
PACK EXTREMITY ARMC (MISCELLANEOUS) ×2 IMPLANT
PAD CAST CTTN 4X4 STRL (SOFTGOODS) IMPLANT
PADDING CAST COTTON 4X4 STRL (SOFTGOODS)
SPLINT WRIST LG LT TX990309 (SOFTGOODS) IMPLANT
SPLINT WRIST M LT TX990308 (SOFTGOODS) IMPLANT
SPLINT WRIST M RT TX990303 (SOFTGOODS) IMPLANT
STOCKINETTE STRL 4IN 9604848 (GAUZE/BANDAGES/DRESSINGS) ×2 IMPLANT
SUT MNCRL 4-0 (SUTURE) ×1
SUT MNCRL 4-0 27XMFL (SUTURE) ×1
SUTURE MNCRL 4-0 27XMF (SUTURE) ×1 IMPLANT

## 2015-02-27 NOTE — Discharge Instructions (Addendum)
Keep dressing clean and dry, do not removed prior to next visit    [place]   1) The drugs that you were given will stay in your system until tomorrow so for the next 24 hours you should not:  A) Drive an automobile B) Make any legal decisions C) Drink any alcoholic beverage   2) You may resume regular meals tomorrow.  Today it is better to start with liquids and gradually work up to solid foods.  You may eat anything you prefer, but it is better to start with liquids, then soup and crackers, and gradually work up to solid foods.   3) Please notify your doctor immediately if you have any unusual bleeding, trouble breathing, redness and pain at the surgery site, drainage, fever, or pain not relieved by medication.    4) Additional Instructions:        Please contact your physician with any problems or Same Day Surgery at [phone], Monday through Friday 6 am to 4 pm, or [place] at [facility] number at [phone].

## 2015-02-27 NOTE — Transfer of Care (Signed)
Immediate Anesthesia Transfer of Care Note  Patient: [name] [name]  Procedure(s) Performed: Procedure(s): CYST REMOVAL (Left)  Patient Location: PACU  Anesthesia Type:General  Level of Consciousness: sedated  Airway & Oxygen Therapy: Patient Spontanous Breathing and Patient connected to face mask oxygen  Post-op Assessment: Report given to RN and Post -op Vital signs reviewed and stable  Post vital signs: Reviewed and stable  Last Vitals: 6160 80hr 100%sat 18resp 113/53 bp  Filed Vitals:   02/27/15 1302 02/27/15 1330  BP: 134/69   Pulse: 92 79  Temp: 36.5 C   Resp: 16     Complications: No apparent anesthesia complications

## 2015-02-27 NOTE — OR Nursing (Signed)
Dr. Marcello Moores aware patient did not take beta blocker this am.  No new orders.

## 2015-02-27 NOTE — H&P (Signed)
Reviewed paper H+P, will be scanned into chart. No changes noted.  

## 2015-02-27 NOTE — Anesthesia Procedure Notes (Signed)
Procedure Name: LMA Insertion Date/Time: 02/27/2015 2:21 PM Performed by: Delaney Meigs Pre-anesthesia Checklist: Patient identified, Emergency Drugs available, Suction available, Patient being monitored and Timeout performed Patient Re-evaluated:Patient Re-evaluated prior to inductionOxygen Delivery Method: Circle system utilized and Simple face mask Preoxygenation: Pre-oxygenation with 100% oxygen Intubation Type: IV induction Ventilation: Mask ventilation without difficulty LMA Size: 3.5 Placement Confirmation: breath sounds checked- equal and bilateral and positive ETCO2 Tube secured with: Tape

## 2015-02-27 NOTE — Anesthesia Postprocedure Evaluation (Signed)
Anesthesia Post Note  Patient: [name] [name]  Procedure(s) Performed: Procedure(s) (LRB): CYST REMOVAL (Left)  Patient location during evaluation: PACU Anesthesia Type: General Level of consciousness: awake and alert Pain management: pain level controlled Vital Signs Assessment: post-procedure vital signs reviewed and stable Respiratory status: spontaneous breathing, nonlabored ventilation, respiratory function stable and patient connected to nasal cannula oxygen Cardiovascular status: blood pressure returned to baseline and stable Postop Assessment: no signs of nausea or vomiting Anesthetic complications: no    Last Vitals:  Filed Vitals:   02/27/15 1547 02/27/15 1600  BP: 134/91 155/88  Pulse: 77 82  Temp: 35.7 C   Resp: 14     Last Pain:  Filed Vitals:   02/27/15 1619  PainSc: 0-No pain                 Burns Timson S

## 2015-02-27 NOTE — Op Note (Signed)
02/27/2015  2:53 PM  PATIENT:  [name] [name]  75 y.o. female  PRE-OPERATIVE DIAGNOSIS:  mucous cyst joint left thumb  POST-OPERATIVE DIAGNOSIS:  mucous cyst joint left thumb  PROCEDURE:  Procedure(s): CYST REMOVAL (Left)  SURGEON: Laurene Footman, MD  ASSISTANTS: None  ANESTHESIA:   general  EBL:  Total I/O In: 200 [I.V.:200] Out: -   BLOOD ADMINISTERED:none  DRAINS: none   LOCAL MEDICATIONS USED:  MARCAINE     SPECIMEN:  No Specimen  DISPOSITION OF SPECIMEN:  N/A  COUNTS:  YES  TOURNIQUET:    IMPLANTS: None  DICTATION: .Dragon Dictation patient brought the operating room and after adequate anesthesia was obtained left arm was prepped and draped in sterile fashion. After patient identification and timeout procedures were completed, a Penrose drain was placed around the base of the thumb as a tourniquet. 8 cc of half percent Sensorcaine was infiltrated as a digital block in postop analgesia. A curved linear incision was made with an elliptical excision of the cyst and exploration of the joint revealed's cyst arising from the IP joint with a large dorsal spur off the proximal phalanx. This is debrided with use of a rongeur. There is also a large dorsal fragment that had the extensor tendon attached could not be debrided. The wound was thoroughly irrigated after debridement of the joint and elliptical excision of the cyst itself. The wound was then closed with simple interrupted 4-0 nylon and sterile dressings applied Xeroform 4 x 4's and a Kling wrap around the finger.  PLAN OF CARE: Discharge to home after PACU  PATIENT DISPOSITION:  PACU - hemodynamically stable.

## 2015-02-27 NOTE — Anesthesia Preprocedure Evaluation (Addendum)
Anesthesia Evaluation  Patient identified by MRN, date of birth, ID band Patient awake    Reviewed: Allergy & Precautions, NPO status , Patient's Chart, lab work & pertinent test results, reviewed documented beta blocker date and time   Airway Mallampati: II  TM Distance: >3 FB     Dental  (+) Chipped   Pulmonary asthma , COPD,  oxygen dependent, Current Smoker,           Cardiovascular hypertension, Pt. on medications and Pt. on home beta blockers +CHF       Neuro/Psych  Neuromuscular disease    GI/Hepatic   Endo/Other    Renal/GU      Musculoskeletal  (+) Arthritis , Fibromyalgia -  Abdominal   Peds  Hematology   Anesthesia Other Findings Dr. Rudene Christians would prefer gen'l as he will be getting into the bone.  Reproductive/Obstetrics                            Anesthesia Physical Anesthesia Plan  ASA: II  Anesthesia Plan: General and MAC   Post-op Pain Management:    Induction: Intravenous  Airway Management Planned: LMA  Additional Equipment:   Intra-op Plan:   Post-operative Plan:   Informed Consent: I have reviewed the patients History and Physical, chart, labs and discussed the procedure including the risks, benefits and alternatives for the proposed anesthesia with the patient or authorized representative who has indicated his/her understanding and acceptance.     Plan Discussed with: CRNA  Anesthesia Plan Comments:         Anesthesia Quick Evaluation

## 2015-02-28 ENCOUNTER — Encounter: Payer: Self-pay | Admitting: Orthopedic Surgery

## 2015-03-12 ENCOUNTER — Other Ambulatory Visit: Payer: Self-pay | Admitting: *Deleted

## 2015-03-12 DIAGNOSIS — C50919 Malignant neoplasm of unspecified site of unspecified female breast: Secondary | ICD-10-CM

## 2015-03-13 ENCOUNTER — Inpatient Hospital Stay: Payer: Commercial Managed Care - HMO | Admitting: Oncology

## 2015-03-13 ENCOUNTER — Inpatient Hospital Stay: Payer: Commercial Managed Care - HMO

## 2015-03-17 DIAGNOSIS — R0602 Shortness of breath: Secondary | ICD-10-CM | POA: Diagnosis not present

## 2015-03-17 DIAGNOSIS — I5022 Chronic systolic (congestive) heart failure: Secondary | ICD-10-CM | POA: Diagnosis not present

## 2015-03-26 ENCOUNTER — Inpatient Hospital Stay: Payer: Commercial Managed Care - HMO | Admitting: Oncology

## 2015-03-26 ENCOUNTER — Inpatient Hospital Stay: Payer: Commercial Managed Care - HMO

## 2015-04-04 DIAGNOSIS — J449 Chronic obstructive pulmonary disease, unspecified: Secondary | ICD-10-CM | POA: Diagnosis not present

## 2015-04-04 DIAGNOSIS — Z23 Encounter for immunization: Secondary | ICD-10-CM | POA: Diagnosis not present

## 2015-04-04 DIAGNOSIS — I2510 Atherosclerotic heart disease of native coronary artery without angina pectoris: Secondary | ICD-10-CM | POA: Diagnosis not present

## 2015-04-04 DIAGNOSIS — I5022 Chronic systolic (congestive) heart failure: Secondary | ICD-10-CM | POA: Diagnosis not present

## 2015-04-04 DIAGNOSIS — Z0000 Encounter for general adult medical examination without abnormal findings: Secondary | ICD-10-CM | POA: Diagnosis not present

## 2015-04-04 DIAGNOSIS — M150 Primary generalized (osteo)arthritis: Secondary | ICD-10-CM | POA: Diagnosis not present

## 2015-04-06 DIAGNOSIS — R531 Weakness: Secondary | ICD-10-CM | POA: Diagnosis not present

## 2015-04-06 DIAGNOSIS — J440 Chronic obstructive pulmonary disease with acute lower respiratory infection: Secondary | ICD-10-CM | POA: Diagnosis not present

## 2015-04-06 DIAGNOSIS — I2510 Atherosclerotic heart disease of native coronary artery without angina pectoris: Secondary | ICD-10-CM | POA: Diagnosis not present

## 2015-04-06 DIAGNOSIS — R05 Cough: Secondary | ICD-10-CM | POA: Diagnosis not present

## 2015-04-06 DIAGNOSIS — M797 Fibromyalgia: Secondary | ICD-10-CM | POA: Diagnosis not present

## 2015-04-06 DIAGNOSIS — G603 Idiopathic progressive neuropathy: Secondary | ICD-10-CM | POA: Diagnosis not present

## 2015-04-06 DIAGNOSIS — M150 Primary generalized (osteo)arthritis: Secondary | ICD-10-CM | POA: Diagnosis not present

## 2015-04-06 DIAGNOSIS — E871 Hypo-osmolality and hyponatremia: Secondary | ICD-10-CM | POA: Diagnosis not present

## 2015-04-06 DIAGNOSIS — I5022 Chronic systolic (congestive) heart failure: Secondary | ICD-10-CM | POA: Diagnosis not present

## 2015-04-09 ENCOUNTER — Other Ambulatory Visit: Payer: Self-pay | Admitting: Oncology

## 2015-04-17 DIAGNOSIS — R0602 Shortness of breath: Secondary | ICD-10-CM | POA: Diagnosis not present

## 2015-04-17 DIAGNOSIS — I5022 Chronic systolic (congestive) heart failure: Secondary | ICD-10-CM | POA: Diagnosis not present

## 2015-05-10 ENCOUNTER — Inpatient Hospital Stay: Payer: Commercial Managed Care - HMO | Attending: Oncology

## 2015-05-10 ENCOUNTER — Inpatient Hospital Stay (HOSPITAL_BASED_OUTPATIENT_CLINIC_OR_DEPARTMENT_OTHER): Payer: Commercial Managed Care - HMO | Admitting: Oncology

## 2015-05-10 ENCOUNTER — Encounter: Payer: Self-pay | Admitting: Oncology

## 2015-05-10 VITALS — BP 146/74 | HR 80 | Temp 96.1°F | Resp 18 | Wt 123.7 lb

## 2015-05-10 DIAGNOSIS — Z170 Estrogen receptor positive status [ER+]: Secondary | ICD-10-CM

## 2015-05-10 DIAGNOSIS — R05 Cough: Secondary | ICD-10-CM | POA: Diagnosis not present

## 2015-05-10 DIAGNOSIS — I719 Aortic aneurysm of unspecified site, without rupture: Secondary | ICD-10-CM | POA: Insufficient documentation

## 2015-05-10 DIAGNOSIS — Z853 Personal history of malignant neoplasm of breast: Secondary | ICD-10-CM | POA: Diagnosis not present

## 2015-05-10 DIAGNOSIS — Z923 Personal history of irradiation: Secondary | ICD-10-CM | POA: Diagnosis not present

## 2015-05-10 DIAGNOSIS — M797 Fibromyalgia: Secondary | ICD-10-CM | POA: Insufficient documentation

## 2015-05-10 DIAGNOSIS — J449 Chronic obstructive pulmonary disease, unspecified: Secondary | ICD-10-CM

## 2015-05-10 DIAGNOSIS — M069 Rheumatoid arthritis, unspecified: Secondary | ICD-10-CM | POA: Insufficient documentation

## 2015-05-10 DIAGNOSIS — Z9981 Dependence on supplemental oxygen: Secondary | ICD-10-CM | POA: Diagnosis not present

## 2015-05-10 DIAGNOSIS — R300 Dysuria: Secondary | ICD-10-CM | POA: Diagnosis not present

## 2015-05-10 DIAGNOSIS — M545 Low back pain: Secondary | ICD-10-CM

## 2015-05-10 DIAGNOSIS — C50919 Malignant neoplasm of unspecified site of unspecified female breast: Secondary | ICD-10-CM

## 2015-05-10 DIAGNOSIS — I509 Heart failure, unspecified: Secondary | ICD-10-CM | POA: Insufficient documentation

## 2015-05-10 DIAGNOSIS — F17210 Nicotine dependence, cigarettes, uncomplicated: Secondary | ICD-10-CM

## 2015-05-10 DIAGNOSIS — Z79899 Other long term (current) drug therapy: Secondary | ICD-10-CM | POA: Insufficient documentation

## 2015-05-10 DIAGNOSIS — I10 Essential (primary) hypertension: Secondary | ICD-10-CM | POA: Insufficient documentation

## 2015-05-10 DIAGNOSIS — Z9221 Personal history of antineoplastic chemotherapy: Secondary | ICD-10-CM | POA: Diagnosis not present

## 2015-05-10 DIAGNOSIS — Z9223 Personal history of estrogen therapy: Secondary | ICD-10-CM | POA: Insufficient documentation

## 2015-05-10 LAB — CBC WITH DIFFERENTIAL/PLATELET
BASOS ABS: 0.1 10*3/uL (ref 0–0.1)
Basophils Relative: 1 %
EOS PCT: 3 %
Eosinophils Absolute: 0.2 10*3/uL (ref 0–0.7)
HEMATOCRIT: 38.9 % (ref 35.0–47.0)
Hemoglobin: 13.2 g/dL (ref 12.0–16.0)
LYMPHS ABS: 1.7 10*3/uL (ref 1.0–3.6)
LYMPHS PCT: 22 %
MCH: 30.7 pg (ref 26.0–34.0)
MCHC: 34 g/dL (ref 32.0–36.0)
MCV: 90.4 fL (ref 80.0–100.0)
Monocytes Absolute: 0.7 10*3/uL (ref 0.2–0.9)
Monocytes Relative: 9 %
NEUTROS ABS: 4.9 10*3/uL (ref 1.4–6.5)
Neutrophils Relative %: 65 %
Platelets: 247 10*3/uL (ref 150–440)
RBC: 4.3 MIL/uL (ref 3.80–5.20)
RDW: 13.8 % (ref 11.5–14.5)
WBC: 7.5 10*3/uL (ref 3.6–11.0)

## 2015-05-10 LAB — COMPREHENSIVE METABOLIC PANEL
ALT: 12 U/L — AB (ref 14–54)
AST: 22 U/L (ref 15–41)
Albumin: 4.1 g/dL (ref 3.5–5.0)
Alkaline Phosphatase: 105 U/L (ref 38–126)
Anion gap: 7 (ref 5–15)
BILIRUBIN TOTAL: 0.3 mg/dL (ref 0.3–1.2)
BUN: 10 mg/dL (ref 6–20)
CO2: 29 mmol/L (ref 22–32)
CREATININE: 1.09 mg/dL — AB (ref 0.44–1.00)
Calcium: 10.1 mg/dL (ref 8.9–10.3)
Chloride: 95 mmol/L — ABNORMAL LOW (ref 101–111)
GFR, EST AFRICAN AMERICAN: 57 mL/min — AB (ref >60)
GFR, EST NON AFRICAN AMERICAN: 49 mL/min — AB (ref >60)
Glucose, Bld: 117 mg/dL — ABNORMAL HIGH (ref 65–99)
Potassium: 4.2 mmol/L (ref 3.5–5.1)
Sodium: 131 mmol/L — ABNORMAL LOW (ref 135–145)
TOTAL PROTEIN: 7.9 g/dL (ref 6.5–8.1)

## 2015-05-11 NOTE — Progress Notes (Signed)
[place] @ [facility] Telephone:([phone]) [phone]  Fax:(336) [place]: 1940/10/05  MR#: [id number]  WPV#:[id number]  Patient Care Team: Tracie Harrier, MD as PCP - General (Internal Medicine)  CHIEF COMPLAINT:  Chief Complaint  Patient presents with  . Hx of Breast Cancer   Dr. Bary Castilla  Chief Complaint/Problem List:  Carcinoma of breast.  AJCC Staging: pT1cN0M0 (diagnosis in February of 2007)  Stage Grouping: IC Finished Femara in August of 2012 Cancer Status:  No evidence of disease. Estrogen receptor positive. Progesterone receptor positive. HER-2/neu negative. By [facility] study. Two sentinel lymph nodes negative. High grade 3.  No history exists.    No flowsheet data found.  INTERVAL HISTORY:  Patient is here for further evaluation and treatment consideration regarding carcinoma of breast. She completed Femara in August of 2012. Her last mammogram was in February 2015 and reported as [facility] 2. She reports having multiple medical problems including recent diagnosis of "5 Aortic aneurysms that Dr. Lucky Cowboy follows with CT scans every 6 months". He has suggested surgery but she has thus far refused. She also reports burning with urination and pain in her lower left back. She stated that she thought it felt like a "bladder infection" and was going to contact Dr. Bjorn Loser office for an appointment. She denies any fever, chills, nausea, vomiting, diarrhea, or chest pain. She continues to smoke an occassional cigarette and reports that cough and shortness of breath are chronic and unchanged. She denies any new bony pain or fracture  Patient had multiple broken appointment.  Here for further follow-up mammogram has been scheduled but she did not go for mammogram.  She continues to smoke  No bone pains appetite has been stable. REVIEW OF SYSTEMS:   GENERAL:  Feels good.  Active.  No fevers, sweats or weight loss. PERFORMANCE STATUS (ECOG): 01 HEENT:  No visual changes, runny  nose, sore throat, mouth sores or tenderness. Lungs: No shortness of breath or cough.  No hemoptysis. Cardiac:  No chest pain, palpitations, orthopnea, or PND. GI:  No nausea, vomiting, diarrhea, constipation, melena or hematochezia. GU:  No urgency, frequency, dysuria, or hematuria. Musculoskeletal:  No back pain.  No joint pain.  No muscle tenderness. Extremities:  No pain or swelling. Skin:  No rashes or skin changes. Neuro:  No headache, numbness or weakness, balance or coordination issues. Endocrine:  No diabetes, thyroid issues, hot flashes or night sweats. Psych:  No mood changes, depression or anxiety. Pain:  No focal pain. Review of systems:  All other systems reviewed and found to be negative. As per HPI. Otherwise, a complete review of systems is negatve.  PAST MEDICAL HISTORY: Past Medical History  Diagnosis Date  . Hypertension   . CHF (congestive heart failure) ([place])   . Asthma   . COPD (chronic obstructive pulmonary disease) ([place])   . Supplemental oxygen dependent     2.5l  . Fibromyalgia   . Cancer ([place])   . Breast cancer ([place])     left  . Chronic UTI   . Polyp, larynx   . Sinus infection     recent  . RA (rheumatoid arthritis) ([place])   . Neuropathy ([place])   . Dizziness   . Stumbling gait     to the left    PAST SURGICAL HISTORY: Past Surgical History  Procedure Laterality Date  . Eye muscle surgery Right     13 surgeries  . Abdominal hysterectomy    . Thumb arthroscopy  Left   . Cyst excision Left 02/27/2015    Procedure: CYST REMOVAL;  Surgeon: Hessie Knows, MD;  Location: [facility] ORS;  Service: Orthopedics;  Laterality: Left;    FAMILY HISTORY No family history on file.  ADVANCED DIRECTIVES:  No flowsheet data found.  HEALTH MAINTENANCE: Social History  Substance Use Topics  . Smoking status: Current Some Day Smoker -- 0.25 packs/day    Types: Cigarettes  . Smokeless tobacco: None  . Alcohol Use: No      Allergies  Allergen Reactions  .  Contrast Media [Iodinated Diagnostic Agents] Other (See Comments)    Pt was sent to the ED following contrast media injection at [place]. Unknown reason. She has been premedicated since without complications. Pt to be premedicated prior to contrast media injections  . Ioxaglate Other (See Comments)    Pt was sent to the ED following contrast media injection at [place]. Unknown reason. She has been premedicated since without complications. Pt to be premedicated prior to contrast media injections  . Sulfa Antibiotics     Other reaction(s): Other (See Comments)  . Tetracycline Hives and Other (See Comments)  . White Petrolatum Other (See Comments)  . Amoxicillin-Pot Clavulanate Rash    Blisters in mouth Other reaction(s): Unknown Blisters in mouth Blisters in mouth  . Tape Rash    Current Outpatient Prescriptions  Medication Sig Dispense Refill  . albuterol (PROVENTIL HFA;VENTOLIN HFA) 108 (90 BASE) MCG/ACT inhaler Inhale into the lungs every 6 (six) hours as needed for wheezing or shortness of breath.    [name] albuterol-ipratropium (COMBIVENT) 18-103 MCG/ACT inhaler Inhale into the lungs every 4 (four) hours.    . budesonide-formoterol (SYMBICORT) 160-4.5 MCG/ACT inhaler Inhale 2 puffs into the lungs 2 (two) times daily.    . calcium-vitamin D (OSCAL WITH D) 500-200 MG-UNIT per tablet Take 1 tablet by mouth at bedtime.    . carvedilol (COREG) 3.125 MG tablet Take 6.25 mg by mouth.    . carvedilol (COREG) 6.25 MG tablet Take 6.25 mg by mouth 2 (two) times daily with a meal.    . clonazePAM (KLONOPIN) 1 MG tablet Take 1 mg by mouth 2 (two) times daily.    . furosemide (LASIX) 20 MG tablet Take 20 mg by mouth every other day.    . furosemide (LASIX) 20 MG tablet     . gabapentin (NEURONTIN) 600 MG tablet Take 600 mg by mouth 3 (three) times daily.    [name] HYDROcodone-acetaminophen (NORCO) 5-325 MG tablet Take 1 tablet by mouth every 6 (six) hours as needed for moderate pain. 20  tablet 0  . HYDROcodone-acetaminophen (NORCO/VICODIN) 5-325 MG tablet Take 1 tablet by mouth every 8 (eight) hours as needed for moderate pain.    [name] lisinopril (PRINIVIL,ZESTRIL) 2.5 MG tablet Take 2.5 mg by mouth daily.    . multivitamin-iron-minerals-folic acid (CENTRUM) chewable tablet Chew 1 tablet by mouth daily.    . traMADol (ULTRAM) 50 MG tablet Take 50 mg by mouth daily.    [name] zolpidem (AMBIEN) 5 MG tablet Take 5 mg by mouth at bedtime as needed for sleep.    [name] albuterol-ipratropium (COMBIVENT) 18-103 MCG/ACT inhaler Inhale into the lungs.    [name] albuterol-ipratropium (COMBIVENT) 18-103 MCG/ACT inhaler Inhale into the lungs.    . Calcium Carb-Cholecalciferol (CALCIUM + D3) 600-200 MG-UNIT TABS Take by mouth.    . Calcium Carbonate-Vitamin D (CALCIUM 600+D) 600-200 MG-UNIT TABS Take by mouth.    . Multiple Vitamin (MULTIVITAMIN) tablet Take by mouth.    [name]  nitroGLYCERIN (NITROSTAT) 0.4 MG SL tablet Place under the tongue.    . promethazine (PHENERGAN) 12.5 MG tablet Take by mouth.    . promethazine (PHENERGAN) 12.5 MG tablet Take 12.5 mg by mouth.     No current facility-administered medications for this visit.    OBJECTIVE:  Filed Vitals:   05/10/15 1003  BP: 146/74  Pulse: 80  Temp: 96.1 F (35.6 C)  Resp: 18     Body mass index is 22.62 kg/(m^2).    ECOG FS:1 - Symptomatic but completely ambulatory  PHYSICAL EXAM: General  status: Performance status is good.  Patient has not lost significant weight. Since last evaluation there is no significant change in the general status HEENT: No evidence of stomatitis. Sclera and conjunctivae :: No jaundice.   pale looking. Lungs: Air  entry equal on both sides.  No rhonchi.  No rales.  Cardiac: Heart sounds are normal.  No pericardial rub.  No murmur. Lymphatic system: Cervical, axillary, inguinal, lymph nodes not palpable GI: Abdomen is soft.liver and spleen not palpable.  No ascites.  Bowel sounds are normal.  No other palpable  masses.  No tenderness . Lower extremity: No edema Neurological system: Higher functions, cranial nerves intact no evidence of peripheral neuropathy. Skin: No rash.  No ecchymosis.. No petechial hemorrhages Examination of both breasts within normal limits  LAB RESULTS:  CBC Latest Ref Rng 05/10/2015 02/21/2015  WBC 3.6 - 11.0 K/uL 7.5 5.8  Hemoglobin 12.0 - 16.0 g/dL 13.2 13.0  Hematocrit 35.0 - 47.0 % 38.9 37.5  Platelets 150 - 440 K/uL 247 194    Appointment on 05/10/2015  Component Date Value Ref Range Status  . WBC 05/10/2015 7.5  3.6 - 11.0 K/uL Final  . RBC 05/10/2015 4.30  3.80 - 5.20 MIL/uL Final  . Hemoglobin 05/10/2015 13.2  12.0 - 16.0 g/dL Final  . HCT 05/10/2015 38.9  35.0 - 47.0 % Final  . MCV 05/10/2015 90.4  80.0 - 100.0 fL Final  . MCH 05/10/2015 30.7  26.0 - 34.0 pg Final  . MCHC 05/10/2015 34.0  32.0 - 36.0 g/dL Final  . RDW 05/10/2015 13.8  11.5 - 14.5 % Final  . Platelets 05/10/2015 247  150 - 440 K/uL Final  . Neutrophils Relative % 05/10/2015 65   Final  . Neutro Abs 05/10/2015 4.9  1.4 - 6.5 K/uL Final  . Lymphocytes Relative 05/10/2015 22   Final  . Lymphs Abs 05/10/2015 1.7  1.0 - 3.6 K/uL Final  . Monocytes Relative 05/10/2015 9   Final  . Monocytes Absolute 05/10/2015 0.7  0.2 - 0.9 K/uL Final  . Eosinophils Relative 05/10/2015 3   Final  . Eosinophils Absolute 05/10/2015 0.2  0 - 0.7 K/uL Final  . Basophils Relative 05/10/2015 1   Final  . Basophils Absolute 05/10/2015 0.1  0 - 0.1 K/uL Final  . Sodium 05/10/2015 131* 135 - 145 mmol/L Final  . Potassium 05/10/2015 4.2  3.5 - 5.1 mmol/L Final  . Chloride 05/10/2015 95* 101 - 111 mmol/L Final  . CO2 05/10/2015 29  22 - 32 mmol/L Final  . Glucose, Bld 05/10/2015 117* 65 - 99 mg/dL Final  . BUN 05/10/2015 10  6 - 20 mg/dL Final  . Creatinine, Ser 05/10/2015 1.09* 0.44 - 1.00 mg/dL Final  . Calcium 05/10/2015 10.1  8.9 - 10.3 mg/dL Final  . Total Protein 05/10/2015 7.9  6.5 - 8.1 g/dL Final  . Albumin  05/10/2015 4.1  3.5 - 5.0 g/dL  Final  . AST 05/10/2015 22  15 - 41 U/L Final  . ALT 05/10/2015 12* 14 - 54 U/L Final  . Alkaline Phosphatase 05/10/2015 105  38 - 126 U/L Final  . Total Bilirubin 05/10/2015 0.3  0.3 - 1.2 mg/dL Final  . GFR calc non Af Amer 05/10/2015 49* >60 mL/min Final  . GFR calc Af Amer 05/10/2015 57* >60 mL/min Final   Comment: (NOTE) The eGFR has been calculated using the CKD EPI equation. This calculation has not been validated in all clinical situations. eGFR's persistently <60 mL/min signify possible Chronic Kidney Disease.   . Anion gap 05/10/2015 7  5 - 15 Final       STUDIES: All lab data has been reviewed ASSESSMENT: Carcinoma of the left breast.  Status post lumpectomy radiation therapy and abbreviated chemotherapy anti-hormonal treatment T1 cN0 M0 tumor estrogen and progesterone receptor positive  MEDICAL DECISION MAKING:  Repeat mammogram (screening mammogram has been scheduled)  Patient is now more than 10 years from the initial diagnosis of breast cancer and advised to get follow up with primary care physician and has been discharged from my care. Patient understands that she needs mammogram on a regular basis  [place] [facility]  guidelines for follow-up in breast cancer patient History and physical 1-4 times per year as clinically indicated for first 5 years Periodic screening for changes in the family history and referable to genetic counseling is necessary. Mammographic every 12 months Educated monitor and refer patient for lymphedema management if needed Routine imaging of reconstructed breasts is not indicated In absence of clinical signs and symptoms suggestive of recurrent disease there is no indication for lab or imaging studies for metastatic screening Recommend on tamoxifen needs annual GYN evaluation if uterus is present.  Patient on aromatase inhibitor needs bone density study for evaluation regarding osteoporosis Evidence suggests that active  lifestyle healthy diet limited alcohol intake and achieving i and maintaining ideal body weight may lead to optimal breast cancer outcome  2.  Continuing smoking patient has been counseled regarding that Also was advised to get a screening CT scan her name has been put on our   screening program  Patient expressed understanding and was in agreement with this plan. She also understands that She can call clinic at any time with any questions, concerns, or complaints.    No matching staging information was found for the patient.  Forest Gleason, MD   05/11/2015 7:19 AM

## 2015-05-15 ENCOUNTER — Telehealth: Payer: Self-pay | Admitting: *Deleted

## 2015-05-15 NOTE — Telephone Encounter (Signed)
Received referral for initial lung cancer screening scan. Contacted patient and obtained smoking history as well as answering questions related to screening process. Patient is tentatively scheduled for shared decision making visit and CT scan on 05/18/15, pending insurance approval from business office.

## 2015-05-17 ENCOUNTER — Other Ambulatory Visit: Payer: Self-pay | Admitting: Family Medicine

## 2015-05-17 ENCOUNTER — Encounter: Payer: Self-pay | Admitting: Family Medicine

## 2015-05-17 DIAGNOSIS — I5022 Chronic systolic (congestive) heart failure: Secondary | ICD-10-CM | POA: Diagnosis not present

## 2015-05-17 DIAGNOSIS — R0602 Shortness of breath: Secondary | ICD-10-CM | POA: Diagnosis not present

## 2015-05-17 DIAGNOSIS — Z87891 Personal history of nicotine dependence: Secondary | ICD-10-CM

## 2015-05-17 HISTORY — DX: Personal history of nicotine dependence: Z87.891

## 2015-05-18 ENCOUNTER — Encounter: Payer: Self-pay | Admitting: Family Medicine

## 2015-05-18 ENCOUNTER — Inpatient Hospital Stay: Payer: Commercial Managed Care - HMO | Attending: Oncology | Admitting: Family Medicine

## 2015-05-18 ENCOUNTER — Ambulatory Visit: Payer: Commercial Managed Care - HMO

## 2015-05-18 ENCOUNTER — Inpatient Hospital Stay: Payer: Commercial Managed Care - HMO | Admitting: Family Medicine

## 2015-05-24 ENCOUNTER — Encounter: Payer: Self-pay | Admitting: Oncology

## 2015-05-28 ENCOUNTER — Ambulatory Visit: Payer: Commercial Managed Care - HMO | Attending: Oncology

## 2015-06-17 DIAGNOSIS — I5022 Chronic systolic (congestive) heart failure: Secondary | ICD-10-CM | POA: Diagnosis not present

## 2015-06-17 DIAGNOSIS — R0602 Shortness of breath: Secondary | ICD-10-CM | POA: Diagnosis not present

## 2015-07-05 ENCOUNTER — Telehealth: Payer: Self-pay | Admitting: *Deleted

## 2015-07-05 NOTE — Telephone Encounter (Signed)
Received referral for initial lung cancer screening scan. Contacted patient and obtained smoking history,(current smoker, with 30 pack year history) as well as answering questions related to screening process. Patient denies signs of lung cancer such as weight loss or hemoptysis. Patient denies comorbidity that would prevent curative treatment if lung cancer were found. Patient is tentatively scheduled for shared decision making visit and CT scan on 07/19/15 at 2pm, pending insurance approval from business office.

## 2015-07-11 DIAGNOSIS — I5022 Chronic systolic (congestive) heart failure: Secondary | ICD-10-CM | POA: Diagnosis not present

## 2015-07-11 DIAGNOSIS — C801 Malignant (primary) neoplasm, unspecified: Secondary | ICD-10-CM | POA: Diagnosis not present

## 2015-07-11 DIAGNOSIS — N390 Urinary tract infection, site not specified: Secondary | ICD-10-CM | POA: Diagnosis not present

## 2015-07-11 DIAGNOSIS — M797 Fibromyalgia: Secondary | ICD-10-CM | POA: Diagnosis not present

## 2015-07-11 DIAGNOSIS — R109 Unspecified abdominal pain: Secondary | ICD-10-CM | POA: Diagnosis not present

## 2015-07-11 DIAGNOSIS — M150 Primary generalized (osteo)arthritis: Secondary | ICD-10-CM | POA: Diagnosis not present

## 2015-07-11 DIAGNOSIS — J449 Chronic obstructive pulmonary disease, unspecified: Secondary | ICD-10-CM | POA: Diagnosis not present

## 2015-07-11 DIAGNOSIS — I2510 Atherosclerotic heart disease of native coronary artery without angina pectoris: Secondary | ICD-10-CM | POA: Diagnosis not present

## 2015-07-17 DIAGNOSIS — R0602 Shortness of breath: Secondary | ICD-10-CM | POA: Diagnosis not present

## 2015-07-17 DIAGNOSIS — I5022 Chronic systolic (congestive) heart failure: Secondary | ICD-10-CM | POA: Diagnosis not present

## 2015-07-19 ENCOUNTER — Encounter: Payer: Self-pay | Admitting: Family Medicine

## 2015-07-19 ENCOUNTER — Other Ambulatory Visit: Payer: Self-pay | Admitting: Family Medicine

## 2015-07-19 ENCOUNTER — Inpatient Hospital Stay: Payer: Commercial Managed Care - HMO | Attending: Family Medicine | Admitting: Family Medicine

## 2015-07-19 ENCOUNTER — Ambulatory Visit
Admission: RE | Admit: 2015-07-19 | Discharge: 2015-07-19 | Disposition: A | Discharge status: Home / Self Care | Payer: Commercial Managed Care - HMO | Source: Ambulatory Visit | Attending: Family Medicine | Admitting: Family Medicine

## 2015-07-19 DIAGNOSIS — I2510 Atherosclerotic heart disease of native coronary artery without angina pectoris: Secondary | ICD-10-CM | POA: Insufficient documentation

## 2015-07-19 DIAGNOSIS — Z87891 Personal history of nicotine dependence: Secondary | ICD-10-CM

## 2015-07-19 DIAGNOSIS — I719 Aortic aneurysm of unspecified site, without rupture: Secondary | ICD-10-CM | POA: Diagnosis not present

## 2015-07-19 DIAGNOSIS — Z122 Encounter for screening for malignant neoplasm of respiratory organs: Secondary | ICD-10-CM | POA: Diagnosis not present

## 2015-07-19 DIAGNOSIS — J432 Centrilobular emphysema: Secondary | ICD-10-CM | POA: Insufficient documentation

## 2015-07-19 DIAGNOSIS — I700 Atherosclerosis of aorta: Secondary | ICD-10-CM | POA: Diagnosis not present

## 2015-07-19 NOTE — Progress Notes (Signed)
In accordance with CMS guidelines, patient has meet eligibility criteria including age, absence of signs or symptoms of lung cancer, the specific calculation of cigarette smoking pack-years was 30 years and is a current smoker.   A shared decision-making session was conducted prior to the performance of CT scan. This includes one or more decision aids, includes benefits and harms of screening, follow-up diagnostic testing, over-diagnosis, false positive rate, and total radiation exposure.  Counseling on the importance of adherence to annual lung cancer LDCT screening, impact of co-morbidities, and ability or willingness to undergo diagnosis and treatment is imperative for compliance of the program.  Counseling on the importance of continued smoking cessation for former smokers; the importance of smoking cessation for current smokers and information about tobacco cessation interventions have been given to patient including the [place] at [facility], 1800 quit [place], as well as [place] specific smoking cessation programs.  Written order for lung cancer screening with LDCT has been given to the patient and any and all questions have been answered to the best of my abilities.   Yearly follow up will be scheduled by Burgess Estelle, Thoracic Navigator.

## 2015-07-23 ENCOUNTER — Telehealth: Payer: Self-pay | Admitting: *Deleted

## 2015-07-23 NOTE — Telephone Encounter (Signed)
Notified patient of LDCT lung cancer screening results with recommendation for 12 month follow up imaging. Also notified of incidental finding noted below. Patient verbalizes understanding.  Reports prior knowledge of incidental finding by cardiology and vascular surgery.   IMPRESSION: 1. Lung-RADS Category 1, negative. Continue annual screening with low-dose chest CT without contrast in 12 months. 2. Advanced aortic atherosclerosis. Coronary artery atherosclerosis. Dilatation of the descending thoracic aorta with a more focal area of saccular aneurysm. Findings of are unchanged since 05/24/2014 but suboptimally evaluated on this nondedicated study. Consider follow-up CTA of the chest. 3. Advanced centrilobular emphysema.

## 2015-08-14 DIAGNOSIS — R0602 Shortness of breath: Secondary | ICD-10-CM | POA: Diagnosis not present

## 2015-08-14 DIAGNOSIS — I5022 Chronic systolic (congestive) heart failure: Secondary | ICD-10-CM | POA: Diagnosis not present

## 2015-08-14 DIAGNOSIS — I714 Abdominal aortic aneurysm, without rupture: Secondary | ICD-10-CM | POA: Diagnosis not present

## 2015-08-14 DIAGNOSIS — R9439 Abnormal result of other cardiovascular function study: Secondary | ICD-10-CM | POA: Diagnosis not present

## 2015-08-14 DIAGNOSIS — I2510 Atherosclerotic heart disease of native coronary artery without angina pectoris: Secondary | ICD-10-CM | POA: Diagnosis not present

## 2015-08-17 DIAGNOSIS — I5022 Chronic systolic (congestive) heart failure: Secondary | ICD-10-CM | POA: Diagnosis not present

## 2015-08-17 DIAGNOSIS — R0602 Shortness of breath: Secondary | ICD-10-CM | POA: Diagnosis not present

## 2015-09-05 DIAGNOSIS — I2510 Atherosclerotic heart disease of native coronary artery without angina pectoris: Secondary | ICD-10-CM | POA: Diagnosis not present

## 2015-09-05 DIAGNOSIS — M797 Fibromyalgia: Secondary | ICD-10-CM | POA: Diagnosis not present

## 2015-09-05 DIAGNOSIS — N390 Urinary tract infection, site not specified: Secondary | ICD-10-CM | POA: Diagnosis not present

## 2015-09-05 DIAGNOSIS — R109 Unspecified abdominal pain: Secondary | ICD-10-CM | POA: Diagnosis not present

## 2015-09-05 DIAGNOSIS — J449 Chronic obstructive pulmonary disease, unspecified: Secondary | ICD-10-CM | POA: Diagnosis not present

## 2015-09-05 DIAGNOSIS — C801 Malignant (primary) neoplasm, unspecified: Secondary | ICD-10-CM | POA: Diagnosis not present

## 2015-09-05 DIAGNOSIS — I5022 Chronic systolic (congestive) heart failure: Secondary | ICD-10-CM | POA: Diagnosis not present

## 2015-09-05 DIAGNOSIS — M150 Primary generalized (osteo)arthritis: Secondary | ICD-10-CM | POA: Diagnosis not present

## 2015-09-11 DIAGNOSIS — I7123 Aneurysm of the descending thoracic aorta, without rupture: Secondary | ICD-10-CM | POA: Insufficient documentation

## 2015-09-11 DIAGNOSIS — I5022 Chronic systolic (congestive) heart failure: Secondary | ICD-10-CM | POA: Diagnosis not present

## 2015-09-11 DIAGNOSIS — M797 Fibromyalgia: Secondary | ICD-10-CM | POA: Diagnosis not present

## 2015-09-11 DIAGNOSIS — C801 Malignant (primary) neoplasm, unspecified: Secondary | ICD-10-CM | POA: Diagnosis not present

## 2015-09-11 DIAGNOSIS — N390 Urinary tract infection, site not specified: Secondary | ICD-10-CM | POA: Diagnosis not present

## 2015-09-11 DIAGNOSIS — I712 Thoracic aortic aneurysm, without rupture: Secondary | ICD-10-CM | POA: Diagnosis not present

## 2015-09-11 DIAGNOSIS — I2510 Atherosclerotic heart disease of native coronary artery without angina pectoris: Secondary | ICD-10-CM | POA: Diagnosis not present

## 2015-09-11 DIAGNOSIS — J449 Chronic obstructive pulmonary disease, unspecified: Secondary | ICD-10-CM | POA: Diagnosis not present

## 2015-09-11 DIAGNOSIS — E782 Mixed hyperlipidemia: Secondary | ICD-10-CM | POA: Diagnosis not present

## 2015-09-14 DIAGNOSIS — J449 Chronic obstructive pulmonary disease, unspecified: Secondary | ICD-10-CM | POA: Diagnosis not present

## 2015-09-14 DIAGNOSIS — I714 Abdominal aortic aneurysm, without rupture: Secondary | ICD-10-CM | POA: Diagnosis not present

## 2015-09-14 DIAGNOSIS — I509 Heart failure, unspecified: Secondary | ICD-10-CM | POA: Diagnosis not present

## 2015-09-14 DIAGNOSIS — I712 Thoracic aortic aneurysm, without rupture: Secondary | ICD-10-CM | POA: Diagnosis not present

## 2015-09-14 DIAGNOSIS — I716 Thoracoabdominal aortic aneurysm, without rupture: Secondary | ICD-10-CM | POA: Diagnosis not present

## 2015-09-14 DIAGNOSIS — F17200 Nicotine dependence, unspecified, uncomplicated: Secondary | ICD-10-CM | POA: Diagnosis not present

## 2015-09-17 DIAGNOSIS — I5022 Chronic systolic (congestive) heart failure: Secondary | ICD-10-CM | POA: Diagnosis not present

## 2015-09-17 DIAGNOSIS — R0602 Shortness of breath: Secondary | ICD-10-CM | POA: Diagnosis not present

## 2015-09-18 DIAGNOSIS — E782 Mixed hyperlipidemia: Secondary | ICD-10-CM | POA: Diagnosis not present

## 2015-09-18 DIAGNOSIS — E871 Hypo-osmolality and hyponatremia: Secondary | ICD-10-CM | POA: Diagnosis not present

## 2015-09-18 DIAGNOSIS — I5022 Chronic systolic (congestive) heart failure: Secondary | ICD-10-CM | POA: Diagnosis not present

## 2015-09-18 DIAGNOSIS — R079 Chest pain, unspecified: Secondary | ICD-10-CM | POA: Diagnosis not present

## 2015-09-18 DIAGNOSIS — I712 Thoracic aortic aneurysm, without rupture: Secondary | ICD-10-CM | POA: Diagnosis not present

## 2015-09-18 DIAGNOSIS — I714 Abdominal aortic aneurysm, without rupture: Secondary | ICD-10-CM | POA: Diagnosis not present

## 2015-09-18 DIAGNOSIS — I2510 Atherosclerotic heart disease of native coronary artery without angina pectoris: Secondary | ICD-10-CM | POA: Diagnosis not present

## 2015-09-18 DIAGNOSIS — R0602 Shortness of breath: Secondary | ICD-10-CM | POA: Diagnosis not present

## 2015-09-21 ENCOUNTER — Other Ambulatory Visit: Payer: Self-pay | Admitting: Vascular Surgery

## 2015-09-21 DIAGNOSIS — I714 Abdominal aortic aneurysm, without rupture, unspecified: Secondary | ICD-10-CM

## 2015-09-21 DIAGNOSIS — I712 Thoracic aortic aneurysm, without rupture, unspecified: Secondary | ICD-10-CM

## 2015-09-26 ENCOUNTER — Ambulatory Visit: Payer: Commercial Managed Care - HMO

## 2015-09-26 ENCOUNTER — Ambulatory Visit: Admission: RE | Admit: 2015-09-26 | Payer: Commercial Managed Care - HMO | Source: Ambulatory Visit

## 2015-10-03 ENCOUNTER — Ambulatory Visit
Admission: RE | Admit: 2015-10-03 | Discharge: 2015-10-03 | Disposition: A | Discharge status: Home / Self Care | Payer: Commercial Managed Care - HMO | Source: Ambulatory Visit | Attending: Vascular Surgery | Admitting: Vascular Surgery

## 2015-10-03 DIAGNOSIS — I712 Thoracic aortic aneurysm, without rupture, unspecified: Secondary | ICD-10-CM

## 2015-10-03 DIAGNOSIS — I714 Abdominal aortic aneurysm, without rupture, unspecified: Secondary | ICD-10-CM

## 2015-10-03 DIAGNOSIS — I701 Atherosclerosis of renal artery: Secondary | ICD-10-CM | POA: Insufficient documentation

## 2015-10-03 DIAGNOSIS — K5730 Diverticulosis of large intestine without perforation or abscess without bleeding: Secondary | ICD-10-CM | POA: Insufficient documentation

## 2015-10-03 DIAGNOSIS — I700 Atherosclerosis of aorta: Secondary | ICD-10-CM | POA: Diagnosis not present

## 2015-10-03 LAB — POCT I-STAT CREATININE: Creatinine, Ser: 1.3 mg/dL — ABNORMAL HIGH (ref 0.44–1.00)

## 2015-10-03 MED ORDER — IOPAMIDOL (ISOVUE-370) INJECTION 76%
80.0000 mL | Freq: Once | INTRAVENOUS | Status: AC | PRN
  Administered 2015-10-03: 80 mL via INTRAVENOUS

## 2015-10-04 ENCOUNTER — Ambulatory Visit: Payer: Commercial Managed Care - HMO

## 2015-10-04 ENCOUNTER — Ambulatory Visit: Admission: RE | Admit: 2015-10-04 | Payer: Commercial Managed Care - HMO | Source: Ambulatory Visit

## 2015-10-05 ENCOUNTER — Other Ambulatory Visit: Payer: Self-pay | Admitting: Internal Medicine

## 2015-10-05 DIAGNOSIS — I712 Thoracic aortic aneurysm, without rupture: Secondary | ICD-10-CM | POA: Diagnosis not present

## 2015-10-05 DIAGNOSIS — I714 Abdominal aortic aneurysm, without rupture: Secondary | ICD-10-CM | POA: Diagnosis not present

## 2015-10-05 DIAGNOSIS — J449 Chronic obstructive pulmonary disease, unspecified: Secondary | ICD-10-CM | POA: Diagnosis not present

## 2015-10-05 DIAGNOSIS — Z1231 Encounter for screening mammogram for malignant neoplasm of breast: Secondary | ICD-10-CM

## 2015-10-05 DIAGNOSIS — I716 Thoracoabdominal aortic aneurysm, without rupture: Secondary | ICD-10-CM | POA: Diagnosis not present

## 2015-10-05 DIAGNOSIS — I509 Heart failure, unspecified: Secondary | ICD-10-CM | POA: Diagnosis not present

## 2015-10-05 DIAGNOSIS — I10 Essential (primary) hypertension: Secondary | ICD-10-CM | POA: Diagnosis not present

## 2015-10-05 DIAGNOSIS — F17200 Nicotine dependence, unspecified, uncomplicated: Secondary | ICD-10-CM | POA: Diagnosis not present

## 2015-10-17 DIAGNOSIS — R0602 Shortness of breath: Secondary | ICD-10-CM | POA: Diagnosis not present

## 2015-10-17 DIAGNOSIS — I5022 Chronic systolic (congestive) heart failure: Secondary | ICD-10-CM | POA: Diagnosis not present

## 2015-10-25 ENCOUNTER — Ambulatory Visit: Payer: Commercial Managed Care - HMO

## 2015-10-29 ENCOUNTER — Ambulatory Visit: Payer: Commercial Managed Care - HMO | Attending: Internal Medicine

## 2015-11-01 ENCOUNTER — Encounter: Payer: Self-pay | Admitting: Radiology

## 2015-11-01 ENCOUNTER — Ambulatory Visit
Admission: RE | Admit: 2015-11-01 | Discharge: 2015-11-01 | Disposition: A | Discharge status: Home / Self Care | Payer: Commercial Managed Care - HMO | Source: Ambulatory Visit | Attending: Internal Medicine | Admitting: Internal Medicine

## 2015-11-01 DIAGNOSIS — Z1231 Encounter for screening mammogram for malignant neoplasm of breast: Secondary | ICD-10-CM | POA: Insufficient documentation

## 2015-11-14 ENCOUNTER — Inpatient Hospital Stay
Admission: EM | Admit: 2015-11-14 | Discharge: 2015-11-29 | DRG: 871 | Disposition: A | Discharge status: Skilled Nursing Facility | Payer: Commercial Managed Care - HMO | Attending: Family Medicine | Admitting: Family Medicine

## 2015-11-14 ENCOUNTER — Emergency Department: Payer: Commercial Managed Care - HMO

## 2015-11-14 ENCOUNTER — Encounter: Payer: Self-pay | Admitting: Emergency Medicine

## 2015-11-14 DIAGNOSIS — R0600 Dyspnea, unspecified: Secondary | ICD-10-CM | POA: Diagnosis not present

## 2015-11-14 DIAGNOSIS — J9601 Acute respiratory failure with hypoxia: Secondary | ICD-10-CM | POA: Diagnosis present

## 2015-11-14 DIAGNOSIS — J441 Chronic obstructive pulmonary disease with (acute) exacerbation: Secondary | ICD-10-CM | POA: Diagnosis present

## 2015-11-14 DIAGNOSIS — R410 Disorientation, unspecified: Secondary | ICD-10-CM | POA: Diagnosis not present

## 2015-11-14 DIAGNOSIS — J9690 Respiratory failure, unspecified, unspecified whether with hypoxia or hypercapnia: Secondary | ICD-10-CM | POA: Diagnosis present

## 2015-11-14 DIAGNOSIS — Z452 Encounter for adjustment and management of vascular access device: Secondary | ICD-10-CM | POA: Diagnosis not present

## 2015-11-14 DIAGNOSIS — R6521 Severe sepsis with septic shock: Secondary | ICD-10-CM | POA: Diagnosis present

## 2015-11-14 DIAGNOSIS — F17210 Nicotine dependence, cigarettes, uncomplicated: Secondary | ICD-10-CM | POA: Diagnosis present

## 2015-11-14 DIAGNOSIS — N179 Acute kidney failure, unspecified: Secondary | ICD-10-CM | POA: Diagnosis present

## 2015-11-14 DIAGNOSIS — Z79899 Other long term (current) drug therapy: Secondary | ICD-10-CM

## 2015-11-14 DIAGNOSIS — J9600 Acute respiratory failure, unspecified whether with hypoxia or hypercapnia: Secondary | ICD-10-CM

## 2015-11-14 DIAGNOSIS — F0390 Unspecified dementia without behavioral disturbance: Secondary | ICD-10-CM | POA: Diagnosis present

## 2015-11-14 DIAGNOSIS — I509 Heart failure, unspecified: Secondary | ICD-10-CM | POA: Diagnosis not present

## 2015-11-14 DIAGNOSIS — I5022 Chronic systolic (congestive) heart failure: Secondary | ICD-10-CM | POA: Diagnosis present

## 2015-11-14 DIAGNOSIS — J181 Lobar pneumonia, unspecified organism: Secondary | ICD-10-CM | POA: Diagnosis not present

## 2015-11-14 DIAGNOSIS — Z853 Personal history of malignant neoplasm of breast: Secondary | ICD-10-CM | POA: Diagnosis not present

## 2015-11-14 DIAGNOSIS — J440 Chronic obstructive pulmonary disease with acute lower respiratory infection: Secondary | ICD-10-CM | POA: Diagnosis present

## 2015-11-14 DIAGNOSIS — Z7951 Long term (current) use of inhaled steroids: Secondary | ICD-10-CM

## 2015-11-14 DIAGNOSIS — R262 Difficulty in walking, not elsewhere classified: Secondary | ICD-10-CM

## 2015-11-14 DIAGNOSIS — J449 Chronic obstructive pulmonary disease, unspecified: Secondary | ICD-10-CM | POA: Diagnosis not present

## 2015-11-14 DIAGNOSIS — M069 Rheumatoid arthritis, unspecified: Secondary | ICD-10-CM | POA: Diagnosis present

## 2015-11-14 DIAGNOSIS — E873 Alkalosis: Secondary | ICD-10-CM | POA: Diagnosis present

## 2015-11-14 DIAGNOSIS — C50912 Malignant neoplasm of unspecified site of left female breast: Secondary | ICD-10-CM | POA: Diagnosis not present

## 2015-11-14 DIAGNOSIS — Z978 Presence of other specified devices: Secondary | ICD-10-CM

## 2015-11-14 DIAGNOSIS — M797 Fibromyalgia: Secondary | ICD-10-CM | POA: Diagnosis present

## 2015-11-14 DIAGNOSIS — E876 Hypokalemia: Secondary | ICD-10-CM | POA: Diagnosis present

## 2015-11-14 DIAGNOSIS — R4182 Altered mental status, unspecified: Secondary | ICD-10-CM

## 2015-11-14 DIAGNOSIS — M25559 Pain in unspecified hip: Secondary | ICD-10-CM

## 2015-11-14 DIAGNOSIS — E440 Moderate protein-calorie malnutrition: Secondary | ICD-10-CM | POA: Diagnosis present

## 2015-11-14 DIAGNOSIS — T797XXA Traumatic subcutaneous emphysema, initial encounter: Secondary | ICD-10-CM | POA: Diagnosis not present

## 2015-11-14 DIAGNOSIS — E871 Hypo-osmolality and hyponatremia: Secondary | ICD-10-CM | POA: Diagnosis present

## 2015-11-14 DIAGNOSIS — Z9981 Dependence on supplemental oxygen: Secondary | ICD-10-CM | POA: Diagnosis not present

## 2015-11-14 DIAGNOSIS — Z823 Family history of stroke: Secondary | ICD-10-CM

## 2015-11-14 DIAGNOSIS — I639 Cerebral infarction, unspecified: Secondary | ICD-10-CM

## 2015-11-14 DIAGNOSIS — G9341 Metabolic encephalopathy: Secondary | ICD-10-CM | POA: Diagnosis present

## 2015-11-14 DIAGNOSIS — R400 Somnolence: Secondary | ICD-10-CM | POA: Diagnosis not present

## 2015-11-14 DIAGNOSIS — R0602 Shortness of breath: Secondary | ICD-10-CM

## 2015-11-14 DIAGNOSIS — Z9119 Patient's noncompliance with other medical treatment and regimen: Secondary | ICD-10-CM

## 2015-11-14 DIAGNOSIS — I63133 Cerebral infarction due to embolism of bilateral carotid arteries: Secondary | ICD-10-CM | POA: Diagnosis not present

## 2015-11-14 DIAGNOSIS — Z90710 Acquired absence of both cervix and uterus: Secondary | ICD-10-CM

## 2015-11-14 DIAGNOSIS — I110 Hypertensive heart disease with heart failure: Secondary | ICD-10-CM | POA: Diagnosis present

## 2015-11-14 DIAGNOSIS — R0603 Acute respiratory distress: Secondary | ICD-10-CM | POA: Diagnosis not present

## 2015-11-14 DIAGNOSIS — I63331 Cerebral infarction due to thrombosis of right posterior cerebral artery: Secondary | ICD-10-CM | POA: Diagnosis not present

## 2015-11-14 DIAGNOSIS — S0990XA Unspecified injury of head, initial encounter: Secondary | ICD-10-CM | POA: Diagnosis not present

## 2015-11-14 DIAGNOSIS — I9589 Other hypotension: Secondary | ICD-10-CM | POA: Diagnosis not present

## 2015-11-14 DIAGNOSIS — R0902 Hypoxemia: Secondary | ICD-10-CM

## 2015-11-14 DIAGNOSIS — Z833 Family history of diabetes mellitus: Secondary | ICD-10-CM

## 2015-11-14 DIAGNOSIS — J189 Pneumonia, unspecified organism: Secondary | ICD-10-CM | POA: Diagnosis present

## 2015-11-14 DIAGNOSIS — A419 Sepsis, unspecified organism: Secondary | ICD-10-CM | POA: Diagnosis present

## 2015-11-14 DIAGNOSIS — Z8249 Family history of ischemic heart disease and other diseases of the circulatory system: Secondary | ICD-10-CM

## 2015-11-14 DIAGNOSIS — E162 Hypoglycemia, unspecified: Secondary | ICD-10-CM | POA: Diagnosis present

## 2015-11-14 DIAGNOSIS — I959 Hypotension, unspecified: Secondary | ICD-10-CM | POA: Diagnosis not present

## 2015-11-14 DIAGNOSIS — M6281 Muscle weakness (generalized): Secondary | ICD-10-CM

## 2015-11-14 DIAGNOSIS — Z681 Body mass index (BMI) 19 or less, adult: Secondary | ICD-10-CM | POA: Diagnosis not present

## 2015-11-14 DIAGNOSIS — S79911A Unspecified injury of right hip, initial encounter: Secondary | ICD-10-CM | POA: Diagnosis not present

## 2015-11-14 DIAGNOSIS — M25551 Pain in right hip: Secondary | ICD-10-CM | POA: Diagnosis not present

## 2015-11-14 DIAGNOSIS — E46 Unspecified protein-calorie malnutrition: Secondary | ICD-10-CM | POA: Insufficient documentation

## 2015-11-14 LAB — URINALYSIS COMPLETE WITH MICROSCOPIC (ARMC ONLY)
Bacteria, UA: NONE SEEN
Bilirubin Urine: NEGATIVE
Glucose, UA: NEGATIVE mg/dL
HGB URINE DIPSTICK: NEGATIVE
Ketones, ur: NEGATIVE mg/dL
NITRITE: NEGATIVE
PH: 6 (ref 5.0–8.0)
PROTEIN: NEGATIVE mg/dL
SPECIFIC GRAVITY, URINE: 1.01 (ref 1.005–1.030)
Squamous Epithelial / LPF: NONE SEEN

## 2015-11-14 LAB — INFLUENZA PANEL BY PCR (TYPE A & B)
INFLAPCR: NEGATIVE
Influenza B By PCR: NEGATIVE

## 2015-11-14 LAB — CBC WITH DIFFERENTIAL/PLATELET
BASOS ABS: 0.1 10*3/uL (ref 0–0.1)
BASOS PCT: 1 %
Eosinophils Absolute: 0.3 10*3/uL (ref 0–0.7)
Eosinophils Relative: 3 %
HEMATOCRIT: 40.2 % (ref 35.0–47.0)
HEMOGLOBIN: 13.9 g/dL (ref 12.0–16.0)
Lymphocytes Relative: 18 %
Lymphs Abs: 2.3 10*3/uL (ref 1.0–3.6)
MCH: 31.8 pg (ref 26.0–34.0)
MCHC: 34.5 g/dL (ref 32.0–36.0)
MCV: 92.1 fL (ref 80.0–100.0)
MONOS PCT: 10 %
Monocytes Absolute: 1.3 10*3/uL — ABNORMAL HIGH (ref 0.2–0.9)
NEUTROS ABS: 8.9 10*3/uL — AB (ref 1.4–6.5)
NEUTROS PCT: 68 %
Platelets: 229 10*3/uL (ref 150–440)
RBC: 4.37 MIL/uL (ref 3.80–5.20)
RDW: 13.1 % (ref 11.5–14.5)
WBC: 13 10*3/uL — ABNORMAL HIGH (ref 3.6–11.0)

## 2015-11-14 LAB — COMPREHENSIVE METABOLIC PANEL
ALK PHOS: 98 U/L (ref 38–126)
ALT: 9 U/L — ABNORMAL LOW (ref 14–54)
ANION GAP: 12 (ref 5–15)
AST: 18 U/L (ref 15–41)
Albumin: 3.9 g/dL (ref 3.5–5.0)
BUN: 13 mg/dL (ref 6–20)
CALCIUM: 9.6 mg/dL (ref 8.9–10.3)
CO2: 27 mmol/L (ref 22–32)
Chloride: 88 mmol/L — ABNORMAL LOW (ref 101–111)
Creatinine, Ser: 2.23 mg/dL — ABNORMAL HIGH (ref 0.44–1.00)
GFR calc non Af Amer: 21 mL/min — ABNORMAL LOW (ref >60)
GFR, EST AFRICAN AMERICAN: 24 mL/min — AB (ref >60)
Glucose, Bld: 115 mg/dL — ABNORMAL HIGH (ref 65–99)
Potassium: 3.5 mmol/L (ref 3.5–5.1)
SODIUM: 127 mmol/L — AB (ref 135–145)
Total Bilirubin: 0.4 mg/dL (ref 0.3–1.2)
Total Protein: 8.1 g/dL (ref 6.5–8.1)

## 2015-11-14 LAB — LACTIC ACID, PLASMA: Lactic Acid, Venous: 1.3 mmol/L (ref 0.5–1.9)

## 2015-11-14 MED ORDER — DEXTROSE 5 % IV SOLN: 1.0000 g | Freq: Once | INTRAVENOUS | Status: DC

## 2015-11-14 MED ORDER — AZITHROMYCIN 500 MG IV SOLR
500.0000 mg | Freq: Once | INTRAVENOUS | Status: AC
  Administered 2015-11-15: 500 mg via INTRAVENOUS
  Filled 2015-11-14: qty 500

## 2015-11-14 MED ORDER — SODIUM CHLORIDE 0.9 % IV BOLUS (SEPSIS)
500.0000 mL | Freq: Once | INTRAVENOUS | Status: AC
  Administered 2015-11-14: 500 mL via INTRAVENOUS

## 2015-11-14 MED ORDER — SODIUM CHLORIDE 0.9 % IV BOLUS (SEPSIS)
1000.0000 mL | Freq: Once | INTRAVENOUS | Status: AC
  Administered 2015-11-14: 1000 mL via INTRAVENOUS

## 2015-11-14 MED ORDER — CEFTRIAXONE SODIUM-DEXTROSE 1-3.74 GM-% IV SOLR
1.0000 g | Freq: Once | INTRAVENOUS | Status: AC
  Administered 2015-11-14: 1 g via INTRAVENOUS
  Filled 2015-11-14: qty 50

## 2015-11-14 MED ORDER — SODIUM CHLORIDE 0.9 % IV BOLUS (SEPSIS)
250.0000 mL | Freq: Once | INTRAVENOUS | Status: AC
  Administered 2015-11-14: 250 mL via INTRAVENOUS

## 2015-11-14 NOTE — ED Triage Notes (Signed)
Pt to ED via EMS from home c/o altered mental status.  EMS states patient fell on Saturday and becoming increasingly more lethargic and confused.  Also states sick and congestion starting Sunday.  Patient normally on 2L [place] at home but is non compliant, O2 sat 86% EMS arrival and went up to 95% with 2L [place].  EMS administed 1 duoneb en route.  Pt presents lethargic, alert to voice, oriented to person and place, audible wheezing and ronchi throughout lungs.  EMS vitals 110/74 BP, 74 HR, 142 CBG, 99.1 temp, 32 capnography.

## 2015-11-14 NOTE — ED Provider Notes (Signed)
Time Seen: Approximately 2135 I have reviewed the triage notes  Chief Complaint: Shortness of Breath and Altered Mental Status   History of Present Illness: [name] [name] is a 75 y.o. female who presents via EMS for altered mental status. Patient reportedly fell on Saturday and became increasingly lethargic and confused. The EMS states that she's had increased chest congestion and cough. Patient's normally on a 2 L nasal cannula at home but is overall noncompliant and was found to be 86% on room air per EMS. She received 1 DuoNeb. Patient's responsive to verbal stimuli and oriented to person and place only. She denies any physical complaints to this historian. There is no family currently present to be interviewed.   Past Medical History:  Diagnosis Date  . Asthma   . Breast cancer ([place]) 2009   left  . Cancer ([place])   . CHF (congestive heart failure) ([place])   . Chronic UTI   . COPD (chronic obstructive pulmonary disease) ([place])   . Dizziness   . Fibromyalgia   . Hypertension   . Neuropathy ([place])   . Personal history of tobacco use, presenting hazards to health 05/17/2015  . Polyp, larynx   . RA (rheumatoid arthritis) ([place])   . Sinus infection    recent  . Stumbling gait    to the left  . Supplemental oxygen dependent    2.5l    Patient Active Problem List   Diagnosis Date Noted  . Personal history of tobacco use, presenting hazards to health 05/17/2015    Past Surgical History:  Procedure Laterality Date  . ABDOMINAL HYSTERECTOMY    . BREAST LUMPECTOMY Left 2009   chemo and radiation  . CYST EXCISION Left 02/27/2015   Procedure: CYST REMOVAL;  Surgeon: Hessie Knows, MD;  Location: [facility] ORS;  Service: Orthopedics;  Laterality: Left;  . EYE MUSCLE SURGERY Right    13 surgeries  . THUMB ARTHROSCOPY Left     Past Surgical History:  Procedure Laterality Date  . ABDOMINAL HYSTERECTOMY    . BREAST LUMPECTOMY Left 2009   chemo and radiation  . CYST EXCISION Left  02/27/2015   Procedure: CYST REMOVAL;  Surgeon: Hessie Knows, MD;  Location: [facility] ORS;  Service: Orthopedics;  Laterality: Left;  . EYE MUSCLE SURGERY Right    13 surgeries  . THUMB ARTHROSCOPY Left     Current Outpatient Rx  . Order #: [id number] Class: Historical Med  . Order #: [id number] Class: Historical Med  . Order #: [id number] Class: Historical Med  . Order #: [id number] Class: Historical Med  . Order #: [id number] Class: Historical Med  . Order #: [id number] Class: Historical Med  . Order #: [id number] Class: Historical Med  . Order #: [id number] Class: Historical Med  . Order #: [id number] Class: Historical Med  . Order #: [id number] Class: Historical Med  . Order #: [id number] Class: Historical Med  . Order #: [id number] Class: Historical Med  . Order #: [id number] Class: Historical Med  . Order #: [id number] Class: Historical Med  . Order #: [id number] Class: Print  . Order #: [id number] Class: Historical Med  . Order #: [id number] Class: Historical Med  . Order #: [id number] Class: Historical Med  . Order #: [id number] Class: Historical Med  . Order #: [id number] Class: Historical Med  . Order #: [id number] Class: Historical Med  . Order #: [id number] Class: Historical Med  . Order #: [id number] Class: Historical Med  . Order #: [id number] Class: Historical Med    Allergies:  Contrast media [iodinated diagnostic agents]; Ioxaglate; Sulfa antibiotics; Tetracycline; White petrolatum; Amoxicillin-pot clavulanate; and Tape  Family History:  History reviewed. No pertinent family history.  Social History: Social History  Substance Use Topics  . Smoking status: Current Every Day Smoker    Packs/day: 0.75    Years: 40.00    Types: Cigarettes  . Smokeless tobacco: Never Used  . Alcohol use No     Review of Systems:   10 point review of systems was performed and was otherwise negative: Limited review of systems from the patient and most of the review of systems quite from medical record etc. Constitutional:  NoObvious fever Eyes: No visual disturbances ENT: No sore throat, ear pain Cardiac: No chest pain Respiratory: No shortness of breath, wheezing, or stridor Abdomen: No abdominal pain, no vomiting, No diarrhea Endocrine: No weight loss, No night sweats Extremities: No peripheral edema, cyanosis Skin: No rashes, easy bruising Neurologic: No focal weakness, trouble with speech or swollowing Urologic: No dysuria, Hematuria, or urinary frequency No obvious head trauma  Physical Exam:  ED Triage Vitals  Enc Vitals Group     BP 11/14/15 2123 (!) 93/57     Pulse Rate 11/14/15 2123 98     Resp 11/14/15 2123 11     Temp 11/14/15 2123 99.1 F (37.3 C)     Temp Source 11/14/15 2123 Oral     SpO2 11/14/15 2123 92 %     Weight 11/14/15 2124 119 lb (54 kg)     Height 11/14/15 2124 '5\' 6"'$  (1.676 m)     Head Circumference --      Peak Flow --      Pain Score --      Pain Loc --      Pain Edu? --      Excl. in [place]? --     General: Awake , Alert , and Oriented times2 lethargic and arousable verbal commands Head: Normal cephalic , atraumatic Eyes: Pupils equal , round, reactive to light Nose/Throat: No nasal drainage, patent upper airway without erythema or exudate.  Neck: Supple, Full range of motion, No anterior adenopathy or palpable thyroid masses Lungs: Coarse breath sounds auscultated bilaterally at the bases without wheezes or rales  Heart: Regular rate, regular rhythm without murmurs , gallops , or rubs Abdomen: Soft, non tender without rebound, guarding , or rigidity; bowel sounds positive and symmetric in all 4 quadrants. No organomegaly .        Extremities: 2 plus symmetric pulses. No edema, clubbing or cyanosis Neurologic: normal ambulation, Motor symmetric without deficits, sensory intact Skin: warm, dry, no rashes   Labs:   All laboratory work was reviewed including any pertinent negatives or positives listed below:  Labs Reviewed  COMPREHENSIVE METABOLIC PANEL -  Abnormal; Notable for the following:       Result Value   Sodium 127 (*)    Chloride 88 (*)    Glucose, Bld 115 (*)    Creatinine, Ser 2.23 (*)    ALT 9 (*)    GFR calc non Af Amer 21 (*)    GFR calc Af Amer 24 (*)    All other components within normal limits  CBC WITH DIFFERENTIAL/PLATELET - Abnormal; Notable for the following:    WBC 13.0 (*)    Neutro Abs 8.9 (*)    Monocytes Absolute 1.3 (*)    All other components within normal limits  URINALYSIS COMPLETEWITH MICROSCOPIC ([facility] ONLY) - Abnormal; Notable for the following:    Color, Urine YELLOW (*)    APPearance CLEAR (*)    Leukocytes, UA TRACE (*)  All other components within normal limits  CULTURE, BLOOD (ROUTINE X 2)  CULTURE, BLOOD (ROUTINE X 2)  URINE CULTURE  LACTIC ACID, PLASMA  LACTIC ACID, PLASMA  INFLUENZA PANEL BY PCR (TYPE A & B, H1N1)   Review of laboratory work shows a normal lactic acid with an elevated white blood cell count. EKG: ED ECG REPORT I, Daymon Larsen, the attending physician, personally viewed and interpreted this ECG.  Date: 11/14/2015 EKG Time: 2119 Rate: 98 Rhythm: normal sinus rhythm with occasional PAC QRS Axis: normal Intervals: normal ST/T Wave abnormalities: normal Conduction Disturbances: none Narrative Interpretation: unremarkable No acute ischemic changes   Radiology:  "Chest x-ray was read as negative by the radiologist though my review of the x-ray shows what appears to be possible left lower lobe infiltrate"  I personally reviewed the radiologic studies   CRITICAL CARE Performed by: Daymon Larsen   Total critical care time: 35 minutes  Critical care time was exclusive of separately billable procedures and treating other patients.  Critical care was necessary to treat or prevent imminent or life-threatening deterioration.  Critical care was time spent personally by me on the following activities: development of treatment plan with patient and/or surrogate  as well as nursing, discussions with consultants, evaluation of patient's response to treatment, examination of patient, obtaining history from patient or surrogate, ordering and performing treatments and interventions, ordering and review of laboratory studies, ordering and review of radiographic studies, pulse oximetry and re-evaluation of patient's condition. Initial evaluation and assessment for acute sepsis with hypotension.  ED Course: * Patient's pulse oximetry remained stable Patient was initiated on a workup for acute sepsis with old temperature of 100.2 rectally. Patient was started on septic protocol with IV fluid resuscitation for hypotension and currently has a head CT pending.  Clinical Course     Assessment: Pneumonia Altered mental status      Plan:  inpatient            Daymon Larsen, MD 11/14/15 2306

## 2015-11-15 ENCOUNTER — Inpatient Hospital Stay: Payer: Commercial Managed Care - HMO

## 2015-11-15 ENCOUNTER — Inpatient Hospital Stay (HOSPITAL_COMMUNITY)
Admit: 2015-11-15 | Discharge: 2015-11-15 | Disposition: A | Discharge status: Home / Self Care | Payer: Commercial Managed Care - HMO | Attending: Internal Medicine | Admitting: Internal Medicine

## 2015-11-15 ENCOUNTER — Encounter: Payer: Self-pay | Admitting: Internal Medicine

## 2015-11-15 DIAGNOSIS — I110 Hypertensive heart disease with heart failure: Secondary | ICD-10-CM | POA: Diagnosis present

## 2015-11-15 DIAGNOSIS — E873 Alkalosis: Secondary | ICD-10-CM | POA: Diagnosis present

## 2015-11-15 DIAGNOSIS — J181 Lobar pneumonia, unspecified organism: Secondary | ICD-10-CM | POA: Diagnosis not present

## 2015-11-15 DIAGNOSIS — R410 Disorientation, unspecified: Secondary | ICD-10-CM | POA: Diagnosis not present

## 2015-11-15 DIAGNOSIS — J189 Pneumonia, unspecified organism: Secondary | ICD-10-CM | POA: Diagnosis present

## 2015-11-15 DIAGNOSIS — R400 Somnolence: Secondary | ICD-10-CM | POA: Diagnosis not present

## 2015-11-15 DIAGNOSIS — M797 Fibromyalgia: Secondary | ICD-10-CM | POA: Diagnosis present

## 2015-11-15 DIAGNOSIS — A419 Sepsis, unspecified organism: Secondary | ICD-10-CM | POA: Diagnosis present

## 2015-11-15 DIAGNOSIS — J449 Chronic obstructive pulmonary disease, unspecified: Secondary | ICD-10-CM

## 2015-11-15 DIAGNOSIS — I509 Heart failure, unspecified: Secondary | ICD-10-CM

## 2015-11-15 DIAGNOSIS — I5022 Chronic systolic (congestive) heart failure: Secondary | ICD-10-CM | POA: Diagnosis present

## 2015-11-15 DIAGNOSIS — J440 Chronic obstructive pulmonary disease with acute lower respiratory infection: Secondary | ICD-10-CM | POA: Diagnosis present

## 2015-11-15 DIAGNOSIS — Z90710 Acquired absence of both cervix and uterus: Secondary | ICD-10-CM | POA: Diagnosis not present

## 2015-11-15 DIAGNOSIS — E876 Hypokalemia: Secondary | ICD-10-CM | POA: Diagnosis present

## 2015-11-15 DIAGNOSIS — J441 Chronic obstructive pulmonary disease with (acute) exacerbation: Secondary | ICD-10-CM | POA: Diagnosis present

## 2015-11-15 DIAGNOSIS — I639 Cerebral infarction, unspecified: Secondary | ICD-10-CM | POA: Diagnosis not present

## 2015-11-15 DIAGNOSIS — E871 Hypo-osmolality and hyponatremia: Secondary | ICD-10-CM | POA: Diagnosis present

## 2015-11-15 DIAGNOSIS — M069 Rheumatoid arthritis, unspecified: Secondary | ICD-10-CM | POA: Diagnosis present

## 2015-11-15 DIAGNOSIS — I959 Hypotension, unspecified: Secondary | ICD-10-CM | POA: Diagnosis present

## 2015-11-15 DIAGNOSIS — F17210 Nicotine dependence, cigarettes, uncomplicated: Secondary | ICD-10-CM | POA: Diagnosis present

## 2015-11-15 DIAGNOSIS — R4182 Altered mental status, unspecified: Secondary | ICD-10-CM

## 2015-11-15 DIAGNOSIS — R6521 Severe sepsis with septic shock: Secondary | ICD-10-CM | POA: Diagnosis present

## 2015-11-15 DIAGNOSIS — Z9981 Dependence on supplemental oxygen: Secondary | ICD-10-CM | POA: Diagnosis not present

## 2015-11-15 DIAGNOSIS — Z681 Body mass index (BMI) 19 or less, adult: Secondary | ICD-10-CM | POA: Diagnosis not present

## 2015-11-15 DIAGNOSIS — J9601 Acute respiratory failure with hypoxia: Secondary | ICD-10-CM | POA: Diagnosis present

## 2015-11-15 DIAGNOSIS — G9341 Metabolic encephalopathy: Secondary | ICD-10-CM | POA: Diagnosis present

## 2015-11-15 DIAGNOSIS — Z853 Personal history of malignant neoplasm of breast: Secondary | ICD-10-CM | POA: Diagnosis not present

## 2015-11-15 DIAGNOSIS — F0390 Unspecified dementia without behavioral disturbance: Secondary | ICD-10-CM | POA: Diagnosis present

## 2015-11-15 DIAGNOSIS — E440 Moderate protein-calorie malnutrition: Secondary | ICD-10-CM | POA: Diagnosis present

## 2015-11-15 DIAGNOSIS — N179 Acute kidney failure, unspecified: Secondary | ICD-10-CM | POA: Diagnosis present

## 2015-11-15 DIAGNOSIS — Z9119 Patient's noncompliance with other medical treatment and regimen: Secondary | ICD-10-CM | POA: Diagnosis not present

## 2015-11-15 DIAGNOSIS — I9589 Other hypotension: Secondary | ICD-10-CM | POA: Diagnosis not present

## 2015-11-15 DIAGNOSIS — J9690 Respiratory failure, unspecified, unspecified whether with hypoxia or hypercapnia: Secondary | ICD-10-CM | POA: Diagnosis present

## 2015-11-15 LAB — BLOOD GAS, ARTERIAL
ACID-BASE DEFICIT: 6.4 mmol/L — AB (ref 0.0–2.0)
ACID-BASE DEFICIT: 4.3 mmol/L — AB (ref 0.0–2.0)
Acid-base deficit: 4.1 mmol/L — ABNORMAL HIGH (ref 0.0–2.0)
BICARBONATE: 22.8 mmol/L (ref 20.0–28.0)
BICARBONATE: 25.2 mmol/L (ref 20.0–28.0)
Bicarbonate: 23.6 mmol/L (ref 20.0–28.0)
Delivery systems: POSITIVE
EXPIRATORY PAP: 6
FIO2: 0.4
FIO2: 0.4
FIO2: 0.6
Inspiratory PAP: 14
LHR: 15 {breaths}/min
O2 SAT: 88.6 %
O2 SAT: 85.5 %
O2 Saturation: 71 %
PATIENT TEMPERATURE: 37
PATIENT TEMPERATURE: 37
PCO2 ART: 55 mmHg — AB (ref 32.0–48.0)
PEEP/CPAP: 5 cmH2O
PO2 ART: 70 mmHg — AB (ref 83.0–108.0)
Patient temperature: 37
VT: 400 mL
pCO2 arterial: 61 mmHg — ABNORMAL HIGH (ref 32.0–48.0)
pCO2 arterial: 66 mmHg (ref 32.0–48.0)
pH, Arterial: 7.18 — CL (ref 7.350–7.450)
pH, Arterial: 7.19 — CL (ref 7.350–7.450)
pH, Arterial: 7.24 — ABNORMAL LOW (ref 7.350–7.450)
pO2, Arterial: 47 mmHg — ABNORMAL LOW (ref 83.0–108.0)
pO2, Arterial: 60 mmHg — ABNORMAL LOW (ref 83.0–108.0)

## 2015-11-15 LAB — BASIC METABOLIC PANEL
Anion gap: 7 (ref 5–15)
BUN: 13 mg/dL (ref 6–20)
CALCIUM: 8.2 mg/dL — AB (ref 8.9–10.3)
CHLORIDE: 95 mmol/L — AB (ref 101–111)
CO2: 26 mmol/L (ref 22–32)
CREATININE: 2.19 mg/dL — AB (ref 0.44–1.00)
GFR calc non Af Amer: 21 mL/min — ABNORMAL LOW (ref >60)
GFR, EST AFRICAN AMERICAN: 24 mL/min — AB (ref >60)
GLUCOSE: 98 mg/dL (ref 65–99)
Potassium: 3.3 mmol/L — ABNORMAL LOW (ref 3.5–5.1)
Sodium: 128 mmol/L — ABNORMAL LOW (ref 135–145)

## 2015-11-15 LAB — CBC
HEMATOCRIT: 35.2 % (ref 35.0–47.0)
Hemoglobin: 11.8 g/dL — ABNORMAL LOW (ref 12.0–16.0)
MCH: 31 pg (ref 26.0–34.0)
MCHC: 33.4 g/dL (ref 32.0–36.0)
MCV: 92.8 fL (ref 80.0–100.0)
PLATELETS: 207 10*3/uL (ref 150–440)
RBC: 3.8 MIL/uL (ref 3.80–5.20)
RDW: 13.2 % (ref 11.5–14.5)
WBC: 11.6 10*3/uL — AB (ref 3.6–11.0)

## 2015-11-15 LAB — MAGNESIUM
MAGNESIUM: 1.7 mg/dL (ref 1.7–2.4)
MAGNESIUM: 1.7 mg/dL (ref 1.7–2.4)
Magnesium: 1.8 mg/dL (ref 1.7–2.4)

## 2015-11-15 LAB — GLUCOSE, CAPILLARY
GLUCOSE-CAPILLARY: 166 mg/dL — AB (ref 65–99)
Glucose-Capillary: 145 mg/dL — ABNORMAL HIGH (ref 65–99)
Glucose-Capillary: 182 mg/dL — ABNORMAL HIGH (ref 65–99)
Glucose-Capillary: 198 mg/dL — ABNORMAL HIGH (ref 65–99)

## 2015-11-15 LAB — MRSA PCR SCREENING: MRSA by PCR: NEGATIVE

## 2015-11-15 LAB — PHOSPHORUS
PHOSPHORUS: 4.1 mg/dL (ref 2.5–4.6)
Phosphorus: 3.6 mg/dL (ref 2.5–4.6)
Phosphorus: 3.2 mg/dL (ref 2.5–4.6)

## 2015-11-15 LAB — PROCALCITONIN: Procalcitonin: 0.13 ng/mL

## 2015-11-15 LAB — CORTISOL: CORTISOL PLASMA: 17.4 ug/dL

## 2015-11-15 MED ORDER — MIDAZOLAM HCL 2 MG/2ML IJ SOLN
1.0000 mg | INTRAMUSCULAR | Status: DC | PRN
  Administered 2015-11-15 (×3): 1 mg via INTRAVENOUS
  Filled 2015-11-15 (×2): qty 2

## 2015-11-15 MED ORDER — ACETAMINOPHEN 650 MG RE SUPP: 650.0000 mg | Freq: Four times a day (QID) | RECTAL | Status: DC | PRN

## 2015-11-15 MED ORDER — ROCURONIUM BROMIDE 50 MG/5ML IV SOLN
INTRAVENOUS | Status: AC
  Administered 2015-11-15: 10 mg
  Filled 2015-11-15: qty 1

## 2015-11-15 MED ORDER — ACETAMINOPHEN 325 MG PO TABS: 650.0000 mg | ORAL_TABLET | Freq: Four times a day (QID) | ORAL | Status: DC | PRN

## 2015-11-15 MED ORDER — POTASSIUM CHLORIDE 10 MEQ/50ML IV SOLN
10.0000 meq | INTRAVENOUS | Status: AC
  Administered 2015-11-15 (×2): 10 meq via INTRAVENOUS
  Filled 2015-11-15 (×2): qty 50

## 2015-11-15 MED ORDER — FENTANYL BOLUS VIA INFUSION
25.0000 ug | INTRAVENOUS | Status: DC | PRN
  Filled 2015-11-15: qty 25

## 2015-11-15 MED ORDER — FAMOTIDINE IN NACL 20-0.9 MG/50ML-% IV SOLN
20.0000 mg | INTRAVENOUS | Status: DC
  Administered 2015-11-15 – 2015-11-16 (×2): 20 mg via INTRAVENOUS
  Filled 2015-11-15 (×2): qty 50

## 2015-11-15 MED ORDER — ALBUTEROL SULFATE (2.5 MG/3ML) 0.083% IN NEBU: 2.5000 mg | INHALATION_SOLUTION | RESPIRATORY_TRACT | Status: DC | PRN

## 2015-11-15 MED ORDER — CEFTRIAXONE SODIUM-DEXTROSE 1-3.74 GM-% IV SOLR
1.0000 g | INTRAVENOUS | Status: DC
  Administered 2015-11-15: 1 g via INTRAVENOUS
  Filled 2015-11-15 (×2): qty 50

## 2015-11-15 MED ORDER — MIDAZOLAM HCL 2 MG/2ML IJ SOLN
INTRAMUSCULAR | Status: AC
  Administered 2015-11-15: 2 mg
  Filled 2015-11-15: qty 2

## 2015-11-15 MED ORDER — ONDANSETRON HCL 4 MG PO TABS: 4.0000 mg | ORAL_TABLET | Freq: Four times a day (QID) | ORAL | Status: DC | PRN

## 2015-11-15 MED ORDER — FENTANYL 2500MCG IN NS 250ML (10MCG/ML) PREMIX INFUSION
25.0000 ug/h | INTRAVENOUS | Status: DC
  Administered 2015-11-15: 50 ug/h via INTRAVENOUS
  Administered 2015-11-16: 300 ug/h via INTRAVENOUS
  Administered 2015-11-16 (×3): 200 ug/h via INTRAVENOUS
  Administered 2015-11-16: 300 ug/h via INTRAVENOUS
  Filled 2015-11-15 (×4): qty 250

## 2015-11-15 MED ORDER — MOMETASONE FURO-FORMOTEROL FUM 200-5 MCG/ACT IN AERO
2.0000 | INHALATION_SPRAY | Freq: Two times a day (BID) | RESPIRATORY_TRACT | Status: DC
  Filled 2015-11-15: qty 8.8

## 2015-11-15 MED ORDER — FENTANYL CITRATE (PF) 100 MCG/2ML IJ SOLN
50.0000 ug | INTRAMUSCULAR | Status: DC | PRN
  Administered 2015-11-16: 50 ug via INTRAVENOUS
  Filled 2015-11-15 (×2): qty 2

## 2015-11-15 MED ORDER — MIDAZOLAM HCL 2 MG/2ML IJ SOLN: 1.0000 mg | INTRAMUSCULAR | Status: DC | PRN

## 2015-11-15 MED ORDER — IPRATROPIUM-ALBUTEROL 0.5-2.5 (3) MG/3ML IN SOLN
3.0000 mL | RESPIRATORY_TRACT | Status: DC
  Administered 2015-11-15 – 2015-11-24 (×37): 3 mL via RESPIRATORY_TRACT
  Filled 2015-11-15 (×38): qty 3

## 2015-11-15 MED ORDER — VITAL HIGH PROTEIN PO LIQD
1000.0000 mL | ORAL | Status: DC
  Administered 2015-11-15 – 2015-11-16 (×2): 1000 mL
  Administered 2015-11-16: 15:00:00

## 2015-11-15 MED ORDER — BUDESONIDE 0.25 MG/2ML IN SUSP
0.2500 mg | Freq: Four times a day (QID) | RESPIRATORY_TRACT | Status: DC
  Administered 2015-11-15: 0.25 mg via RESPIRATORY_TRACT
  Filled 2015-11-15: qty 2

## 2015-11-15 MED ORDER — DOCUSATE SODIUM 50 MG/5ML PO LIQD
100.0000 mg | Freq: Two times a day (BID) | ORAL | Status: DC
  Administered 2015-11-15 – 2015-11-16 (×4): 100 mg
  Filled 2015-11-15 (×4): qty 10

## 2015-11-15 MED ORDER — FENTANYL CITRATE (PF) 100 MCG/2ML IJ SOLN
50.0000 ug | INTRAMUSCULAR | Status: DC | PRN
  Administered 2015-11-15: 50 ug via INTRAVENOUS

## 2015-11-15 MED ORDER — DEXTROSE 5 % IV SOLN
0.0000 ug/min | INTRAVENOUS | Status: DC
  Administered 2015-11-15 (×3): 45 ug/min via INTRAVENOUS
  Administered 2015-11-15: 40 ug/min via INTRAVENOUS
  Filled 2015-11-15 (×5): qty 1

## 2015-11-15 MED ORDER — BUDESONIDE 0.5 MG/2ML IN SUSP
0.5000 mg | Freq: Two times a day (BID) | RESPIRATORY_TRACT | Status: DC
  Administered 2015-11-15 – 2015-11-29 (×19): 0.5 mg via RESPIRATORY_TRACT
  Filled 2015-11-15 (×23): qty 2

## 2015-11-15 MED ORDER — METHYLPREDNISOLONE SODIUM SUCC 40 MG IJ SOLR
40.0000 mg | Freq: Two times a day (BID) | INTRAMUSCULAR | Status: DC
  Administered 2015-11-15 – 2015-11-19 (×8): 40 mg via INTRAVENOUS
  Filled 2015-11-15 (×8): qty 1

## 2015-11-15 MED ORDER — ORAL CARE MOUTH RINSE
15.0000 mL | Freq: Four times a day (QID) | OROMUCOSAL | Status: DC
  Administered 2015-11-15 – 2015-11-17 (×8): 15 mL via OROMUCOSAL

## 2015-11-15 MED ORDER — MIDAZOLAM HCL 2 MG/2ML IJ SOLN
1.0000 mg | INTRAMUSCULAR | Status: DC | PRN
  Administered 2015-11-16: 1 mg via INTRAVENOUS
  Filled 2015-11-15 (×7): qty 2

## 2015-11-15 MED ORDER — FENTANYL CITRATE (PF) 100 MCG/2ML IJ SOLN
INTRAMUSCULAR | Status: AC
  Administered 2015-11-15: 50 ug
  Filled 2015-11-15: qty 2

## 2015-11-15 MED ORDER — IPRATROPIUM-ALBUTEROL 0.5-2.5 (3) MG/3ML IN SOLN: 3.0000 mL | RESPIRATORY_TRACT | Status: DC | PRN

## 2015-11-15 MED ORDER — CHLORHEXIDINE GLUCONATE 0.12 % MT SOLN
15.0000 mL | Freq: Two times a day (BID) | OROMUCOSAL | Status: DC
  Administered 2015-11-15 – 2015-11-16 (×5): 15 mL via OROMUCOSAL
  Filled 2015-11-15: qty 15

## 2015-11-15 MED ORDER — ORAL CARE MOUTH RINSE: 15.0000 mL | Freq: Two times a day (BID) | OROMUCOSAL | Status: DC

## 2015-11-15 MED ORDER — BENZONATATE 100 MG PO CAPS
200.0000 mg | ORAL_CAPSULE | Freq: Three times a day (TID) | ORAL | Status: DC | PRN
Start: 2015-11-15 — End: 2015-11-15

## 2015-11-15 MED ORDER — ACETAMINOPHEN 325 MG PO TABS
650.0000 mg | ORAL_TABLET | Freq: Four times a day (QID) | ORAL | Status: DC | PRN
  Administered 2015-11-18 – 2015-11-27 (×3): 650 mg
  Filled 2015-11-15 (×3): qty 2

## 2015-11-15 MED ORDER — IPRATROPIUM-ALBUTEROL 0.5-2.5 (3) MG/3ML IN SOLN
3.0000 mL | Freq: Four times a day (QID) | RESPIRATORY_TRACT | Status: DC
  Administered 2015-11-15: 3 mL via RESPIRATORY_TRACT
  Filled 2015-11-15: qty 3

## 2015-11-15 MED ORDER — SODIUM CHLORIDE 0.9 % IV SOLN
INTRAVENOUS | Status: DC
  Administered 2015-11-15: 02:00:00 via INTRAVENOUS
  Administered 2015-11-15: 50 mL via INTRAVENOUS
  Administered 2015-11-16: 21:00:00 via INTRAVENOUS

## 2015-11-15 MED ORDER — ONDANSETRON HCL 4 MG/2ML IJ SOLN
4.0000 mg | Freq: Four times a day (QID) | INTRAMUSCULAR | Status: DC | PRN
  Administered 2015-11-17: 4 mg via INTRAVENOUS
  Filled 2015-11-15: qty 2

## 2015-11-15 MED ORDER — NOREPINEPHRINE BITARTRATE 1 MG/ML IV SOLN
0.0000 ug/min | INTRAVENOUS | Status: DC
  Administered 2015-11-15: 5 ug/min via INTRAVENOUS
  Administered 2015-11-16: 4 ug/min via INTRAVENOUS
  Administered 2015-11-16: 2 ug/min via INTRAVENOUS
  Administered 2015-11-16: 3 ug/min via INTRAVENOUS
  Filled 2015-11-15 (×3): qty 4

## 2015-11-15 MED ORDER — FENTANYL CITRATE (PF) 100 MCG/2ML IJ SOLN
50.0000 ug | Freq: Once | INTRAMUSCULAR | Status: AC
  Administered 2015-11-15: 50 ug via INTRAVENOUS

## 2015-11-15 MED ORDER — CHLORHEXIDINE GLUCONATE 0.12% ORAL RINSE (MEDLINE KIT)
15.0000 mL | Freq: Two times a day (BID) | OROMUCOSAL | Status: DC
  Administered 2015-11-15 – 2015-11-16 (×4): 15 mL via OROMUCOSAL

## 2015-11-15 MED ORDER — SENNOSIDES 8.8 MG/5ML PO SYRP
5.0000 mL | ORAL_SOLUTION | Freq: Two times a day (BID) | ORAL | Status: DC
  Administered 2015-11-15 – 2015-11-16 (×4): 5 mL
  Filled 2015-11-15 (×4): qty 5

## 2015-11-15 MED ORDER — MIDAZOLAM HCL 2 MG/2ML IJ SOLN
1.0000 mg | INTRAMUSCULAR | Status: DC | PRN
  Administered 2015-11-15 – 2015-11-16 (×5): 1 mg via INTRAVENOUS
  Filled 2015-11-15: qty 2

## 2015-11-15 MED ORDER — DEXTROSE 5 % IV SOLN
500.0000 mg | INTRAVENOUS | Status: DC
  Administered 2015-11-16 – 2015-11-19 (×4): 500 mg via INTRAVENOUS
  Filled 2015-11-15 (×5): qty 500

## 2015-11-15 MED ORDER — HEPARIN SODIUM (PORCINE) 5000 UNIT/ML IJ SOLN
5000.0000 [IU] | Freq: Three times a day (TID) | INTRAMUSCULAR | Status: AC
Start: 2015-11-15 — End: 2015-11-20
  Administered 2015-11-15 – 2015-11-20 (×17): 5000 [IU] via SUBCUTANEOUS
  Filled 2015-11-15 (×16): qty 1

## 2015-11-15 NOTE — Progress Notes (Signed)
Lindwood Qua, NP notified of pt's sustained RR 5-7.  Orders received for STAT ABG.  RT called for ABG draw.

## 2015-11-15 NOTE — ED Notes (Signed)
O2 sats 97% NRB 15L. RT on the way.

## 2015-11-15 NOTE — ED Notes (Signed)
Pharmacy contacted about IV order.

## 2015-11-15 NOTE — Progress Notes (Signed)
[place] NOTE  Pharmacy Consult for Constipation Prevention Indication: Patient on sedation with fentanyl   Allergies  Allergen Reactions  . Contrast Media [Iodinated Diagnostic Agents] Other (See Comments)    Pt was sent to the ED following contrast media injection at [place]. Unknown reason. She has been premedicated since without complications. Pt to be premedicated prior to contrast media injections  . Ioxaglate Other (See Comments)    Pt was sent to the ED following contrast media injection at [facility]. Unknown reason. She has been premedicated since without complications. Pt to be premedicated prior to contrast media injections  . Sulfa Antibiotics     Other reaction(s): Other (See Comments)  . Tetracycline Hives and Other (See Comments)  . White Petrolatum Other (See Comments)  . Amoxicillin-Pot Clavulanate Rash    Blisters in mouth Other reaction(s): Unknown Blisters in mouth Blisters in mouth  . Tape Rash    Patient Measurements: Height: '5\' 5"'$  (165.1 cm) (estimated) Weight: 119 lb 0.8 oz (54 kg) IBW/kg (Calculated) : 57  Vital Signs: Temp: 98.5 F (36.9 C) (11/02 1200) Temp Source: Rectal (11/02 0203) BP: 97/49 (11/02 1200) Pulse Rate: 87 (11/02 0203) Intake/Output from previous day: 11/01 0701 - 11/02 0700 In: 4490.2 [I.V.:456.8; IV Piggyback:4033.3] Out: -  Intake/Output from this shift: Total I/O In: 786.9 [I.V.:736.9; IV Piggyback:50] Out: 850 [Urine:850] Vent settings for last 24 hours: Vent Mode: PRVC FiO2 (%):  [40 %-60 %] 60 % Set Rate:  [15 bmp] 15 bmp Vt Set:  [400 mL-500 mL] 500 mL PEEP:  [5 cmH20] 5 cmH20  Labs:  Recent Labs  11/14/15 2131 11/15/15 0418  WBC 13.0* 11.6*  HGB 13.9 11.8*  HCT 40.2 35.2  PLT 229 207  CREATININE 2.23* 2.19*  MG  --  1.8  PHOS  --  4.1  ALBUMIN 3.9  --   PROT 8.1  --   AST 18  --   ALT 9*  --   ALKPHOS 98  --   BILITOT 0.4  --    Estimated Creatinine  Clearance: 19.5 mL/min (by C-G formula based on SCr of 2.19 mg/dL (H)).   Recent Labs  11/15/15 0237  GLUCAP 145*    Microbiology: Recent Results (from the past 720 hour(s))  Blood Culture (routine x 2)     Status: None (Preliminary result)   Collection Time: 11/14/15  9:31 PM  Result Value Ref Range Status   Specimen Description BLOOD LT FOREARM  Final   Special Requests BOTTLES DRAWN AEROBIC AND ANAEROBIC 6CC  Final   Culture NO GROWTH < 12 HOURS  Final   Report Status PENDING  Incomplete  Blood Culture (routine x 2)     Status: None (Preliminary result)   Collection Time: 11/14/15  9:31 PM  Result Value Ref Range Status   Specimen Description BLOOD LEFT AC  Final   Special Requests BOTTLES DRAWN AEROBIC AND ANAEROBIC ANA5CC AER [place]  Final   Culture NO GROWTH < 12 HOURS  Final   Report Status PENDING  Incomplete  MRSA PCR Screening     Status: None   Collection Time: 11/15/15  2:36 AM  Result Value Ref Range Status   MRSA by PCR NEGATIVE NEGATIVE Final    Comment:        The GeneXpert MRSA Assay (FDA approved for NASAL specimens only), is one component of a comprehensive MRSA colonization surveillance program. It is not intended to diagnose MRSA infection nor to guide  or monitor treatment for MRSA infections.     Assessment: 75 y/o F with a h/o asthma, breast CA, UTI, HTN, CHF admitted with AECOPD and PNA requiring mechanical ventilation with sedation and vasopressors.   Plan:  Will begin senna-docusate via tube bid.   Ulice Dash D 11/15/2015,12:03 PM

## 2015-11-15 NOTE — Progress Notes (Signed)
*  PRELIMINARY RESULTS* Echocardiogram 2D Echocardiogram has been performed.  Sherrie Sport 11/15/2015, 3:06 PM

## 2015-11-15 NOTE — ED Notes (Addendum)
Verbal order from [place], NP to titrate phenylephrine to 52mg/min.

## 2015-11-15 NOTE — Procedures (Signed)
Central Venous Catheter Placement: Indication: Patient receiving vesicant or irritant drug.; Patient receiving intravenous therapy for longer than 5 days.; Patient has limited or no vascular access.   Consent:emergent  Risks and benefits explained in detail including risk of infection, bleeding, respiratory failure and death..   Hand washing performed prior to starting the procedure.   Procedure: An active timeout was performed and correct patient, name, & ID confirmed.  After explaining risk and benefits, patient was positioned correctly for central venous access. Patient was prepped using strict sterile technique including chlorohexadine preps, sterile drape, sterile gown and sterile gloves.  The area was prepped, draped and anesthetized in the usual sterile manner. Patient comfort was obtained.  A triple lumen catheter was placed in RT Internal Jugular Vein There was good blood return, catheter caps were placed on lumens, catheter flushed easily, the line was secured and a sterile dressing and BIO-PATCH applied.   Ultrasound was used to visualize vasculature and guidance of needle.   Number of Attempts: 1 Complications:none Estimated Blood Loss: none Chest Radiograph indicated and ordered.  Operator: Jaray Boliver/[place]   Corrin Parker, M.D.  [facility]  Medical Director [place] Director [facility] Cardio-Pulmonary Department

## 2015-11-15 NOTE — ED Notes (Signed)
Pt noted to have o2 sat 60% on 2L.via . Pt gurling worse. Will response to name [name] [name]. NRB placed at 15L sat coming up.  Dr. Ena Dawley to room. Fluids IV stopped. O2 sat now 88%. RT called to Place on Bipap. Dr. Jannifer Franklin page to update him.

## 2015-11-15 NOTE — Care Management (Signed)
Patient admitted from home with sepsis due to pneumonia to icu with and requiring continuous bipap.  This morning she required intubation and now on full ventilator support.  Has chronic home 02 and there is question of compliance.  There are no family members present.

## 2015-11-15 NOTE — Progress Notes (Signed)
ABG results given to Dr. Mortimer Fries and vent changes made Amy, CP.

## 2015-11-15 NOTE — Consult Note (Signed)
PULMONARY / CRITICAL CARE MEDICINE   Name: [name] [name] MRN: [id number] DOB: 12-18-1940    ADMISSION DATE:  11/14/2015 CONSULTATION DATE:  11/15/15  REFERRING MD:  Dr Jannifer Franklin  CHIEF COMPLAINT: Acute Shortness of breath  HISTORY OF PRESENT ILLNESS:   [name] [name] is a75 yo female with medical history significant for Asthma, Breast Cancer(2009), CHF, COPD, Fibromyalgia, hypertension and  tobacco abuse.  Patient presented to ED via EMS with altered mental status and shortness of breath.  Apparently she fell and became increasingly confused.  Patient is on home -O2 but most of the times she is non compliant.  Her CT chest was concering for PNA.  Patient apparently became more hypoxic and was placed on BiPAP.  PCCM team was consulted for further recommendation.  PAST MEDICAL HISTORY :  She  has a past medical history of Asthma; Breast cancer ([place]) (2009); Cancer [facility]); CHF (congestive heart failure) ([place]); Chronic UTI; COPD (chronic obstructive pulmonary disease) ([place]); Dizziness; Fibromyalgia; Hypertension; Neuropathy ([place]); Personal history of tobacco use, presenting hazards to health (05/17/2015); Polyp, larynx; RA (rheumatoid arthritis) ([place]); Sinus infection; Stumbling gait; and Supplemental oxygen dependent.  PAST SURGICAL HISTORY: She  has a past surgical history that includes Eye muscle surgery (Right); Abdominal hysterectomy; Thumb arthroscopy (Left); Cyst excision (Left, 02/27/2015); and Breast lumpectomy (Left, 2009).  Allergies  Allergen Reactions  . Contrast Media [Iodinated Diagnostic Agents] Other (See Comments)    Pt was sent to the ED following contrast media injection at [place]. Unknown reason. She has been premedicated since without complications. Pt to be premedicated prior to contrast media injections  . Ioxaglate Other (See Comments)    Pt was sent to the ED following contrast media injection at [place]. Unknown reason. She has been premedicated since  without complications. Pt to be premedicated prior to contrast media injections  . Sulfa Antibiotics     Other reaction(s): Other (See Comments)  . Tetracycline Hives and Other (See Comments)  . White Petrolatum Other (See Comments)  . Amoxicillin-Pot Clavulanate Rash    Blisters in mouth Other reaction(s): Unknown Blisters in mouth Blisters in mouth  . Tape Rash    No current facility-administered medications on file prior to encounter.    Current Outpatient Prescriptions on File Prior to Encounter  Medication Sig  . albuterol (PROVENTIL HFA;VENTOLIN HFA) 108 (90 BASE) MCG/ACT inhaler Inhale into the lungs every 6 (six) hours as needed for wheezing or shortness of breath.  [name] albuterol-ipratropium (COMBIVENT) 18-103 MCG/ACT inhaler Inhale into the lungs every 4 (four) hours.  [name] albuterol-ipratropium (COMBIVENT) 18-103 MCG/ACT inhaler Inhale into the lungs.  [name] albuterol-ipratropium (COMBIVENT) 18-103 MCG/ACT inhaler Inhale into the lungs.  . budesonide-formoterol (SYMBICORT) 160-4.5 MCG/ACT inhaler Inhale 2 puffs into the lungs 2 (two) times daily.  . Calcium Carb-Cholecalciferol (CALCIUM + D3) 600-200 MG-UNIT TABS Take by mouth.  . Calcium Carbonate-Vitamin D (CALCIUM 600+D) 600-200 MG-UNIT TABS Take by mouth.  . calcium-vitamin D (OSCAL WITH D) 500-200 MG-UNIT per tablet Take 1 tablet by mouth at bedtime.  . carvedilol (COREG) 3.125 MG tablet Take 6.25 mg by mouth.  . carvedilol (COREG) 6.25 MG tablet Take 6.25 mg by mouth 2 (two) times daily with a meal.  . clonazePAM (KLONOPIN) 1 MG tablet Take 1 mg by mouth 2 (two) times daily.  . furosemide (LASIX) 20 MG tablet Take 20 mg by mouth every other day.  . furosemide (LASIX) 20 MG tablet   . gabapentin (NEURONTIN) 600 MG tablet Take 600 mg  by mouth 3 (three) times daily.  [name] HYDROcodone-acetaminophen (NORCO) 5-325 MG tablet Take 1 tablet by mouth every 6 (six) hours as needed for moderate pain.  [name] HYDROcodone-acetaminophen  (NORCO/VICODIN) 5-325 MG tablet Take 1 tablet by mouth every 8 (eight) hours as needed for moderate pain.  [name] lisinopril (PRINIVIL,ZESTRIL) 2.5 MG tablet Take 2.5 mg by mouth daily.  . Multiple Vitamin (MULTIVITAMIN) tablet Take by mouth.  . multivitamin-iron-minerals-folic acid (CENTRUM) chewable tablet Chew 1 tablet by mouth daily.  . nitroGLYCERIN (NITROSTAT) 0.4 MG SL tablet Place under the tongue.  . promethazine (PHENERGAN) 12.5 MG tablet Take by mouth.  . promethazine (PHENERGAN) 12.5 MG tablet Take 12.5 mg by mouth.  . traMADol (ULTRAM) 50 MG tablet Take 50 mg by mouth daily.  [name] zolpidem (AMBIEN) 5 MG tablet Take 5 mg by mouth at bedtime as needed for sleep.    FAMILY HISTORY:  Her indicated that the status of her father is unknown. She indicated that the status of her sister is unknown.    SOCIAL HISTORY: She  reports that she has been smoking Cigarettes.  She has a 30.00 pack-year smoking history. She has never used smokeless tobacco. She reports that she does not drink alcohol or use drugs.  REVIEW OF SYSTEMS:   Unable to obtain  SUBJECTIVE:  Unable to obtain  VITAL SIGNS: BP (!) 96/47   Pulse 87   Temp 100.2 F (37.9 C) (Rectal)   Resp 11   Ht '5\' 6"'$  (1.676 m)   Wt 54 kg (119 lb)   SpO2 95%   BMI 19.21 kg/m   HEMODYNAMICS:    VENTILATOR SETTINGS:    INTAKE / OUTPUT: No intake/output data recorded.  PHYSICAL EXAMINATION: General:  Elderly white female, found on BiPAP Neuro: confused HEENT:  Atraumatic, normocephalic, no discharge, no JVD appreciated Cardiovascular:  S1, S2, regular rate and rhythm, no MRG noted Lungs: Coarse bilaterally, no wheezes, or rhonchi noted Abdomen: soft and non Tender, no guarding noted Musculoskeletal:  No inflammation/deformity noted Skin:  Grossly intact  LABS:  BMET  Recent Labs Lab 11/14/15 2131  NA 127*  K 3.5  CL 88*  CO2 27  BUN 13  CREATININE 2.23*  GLUCOSE 115*    Electrolytes  Recent Labs Lab  11/14/15 2131  CALCIUM 9.6    CBC  Recent Labs Lab 11/14/15 2131  WBC 13.0*  HGB 13.9  HCT 40.2  PLT 229    Coag's No results for input(s): APTT, INR in the last 168 hours.  Sepsis Markers  Recent Labs Lab 11/14/15 2131  LATICACIDVEN 1.3    ABG  Recent Labs Lab 11/15/15 0029  PHART 7.17*  PCO2ART 70*  PO2ART 129*    Liver Enzymes  Recent Labs Lab 11/14/15 2131  AST 18  ALT 9*  ALKPHOS 98  BILITOT 0.4  ALBUMIN 3.9    Cardiac Enzymes No results for input(s): TROPONINI, PROBNP in the last 168 hours.  Glucose No results for input(s): GLUCAP in the last 168 hours.  Imaging Ct Head Wo Contrast  Result Date: 11/14/2015 CLINICAL DATA:  Initial evaluation for acute altered mental status. Recent fall. EXAM: CT HEAD WITHOUT CONTRAST TECHNIQUE: Contiguous axial images were obtained from the base of the skull through the vertex without intravenous contrast. COMPARISON:  None. FINDINGS: Brain: Generalized age-related cerebral atrophy. Mild chronic microvascular ischemic disease. No acute intracranial hemorrhage. No evidence for acute large vessel territory infarct. No mass lesion, midline shift or mass effect. No extra-axial fluid collection.  Mild prominence of the extra-axial space overlying the bilateral frontal convexities felt to be related atrophy. No hydrocephalus. Vascular: No hyperdense vessel. Scattered vascular calcifications noted within the carotid siphons. Skull: Scalp soft tissues within normal limits.  Calvarium intact. Sinuses/Orbits: Globes and orbital soft tissues within normal limits. Sequela prior sinus surgery noted. Scattered mucosal thickening within the right maxillary sinus, ethmoidal air cells, and sphenoid sinuses. Trace bilateral mastoid effusions. IMPRESSION: 1. No acute intracranial process identified. 2. Age-related cerebral atrophy with mild chronic microvascular ischemic disease. 3. Chronic paranasal sinus disease with sequela prior  sinonasal surgery. Electronically Signed   By: Jeannine Boga M.D.   On: 11/14/2015 23:27   Ct Chest Wo Contrast  Result Date: 11/14/2015 CLINICAL DATA:  Altered mental status, fall 6 with congestion hypoxia decreased O2 sats EXAM: CT CHEST WITHOUT CONTRAST TECHNIQUE: Multidetector CT imaging of the chest was performed following the standard protocol without IV contrast. COMPARISON:  Chest x-ray 11/14/2015, CT chest 10/03/2015 FINDINGS: Cardiovascular: Limited without intravenous contrast. Densely calcified thoracic aorta. Ectasia of the ascending aorta, measuring up to 3.3 Cm. Focal aneurysmal dilatation of the descending thoracic aorta, measuring up to 4.2 cm, 4.2 cm previously. No gross mediastinal hematoma. Extensive coronary artery calcifications. Aortic valvular calcifications. Mitral valvular calcifications. Heart size upper normal. No gross diffusion. Mediastinum/Nodes: No pathologically enlarged mediastinal or axillary nodes. Trachea and mainstem bronchi appear within normal limits. Imaged thyroid gland unremarkable. Mild air distention of the upper esophagus. Lungs/Pleura: Moderate diffuse bilateral emphysematous changes. Mild interstitial thickening within the upper lobes with mild hazy opacity in the posterior right upper lobe since the prior study. Scattered areas of mild ground-glass density within the bilateral upper lobes. Interim finding of soft tissue thickening around the central airways with central airspace disease. No nodules. No effusion. Upper Abdomen: Upper abdominal images demonstrate a few dilated fluid-filled loops of bowel. Musculoskeletal: Rim calcification under the left breast. Degenerative changes. No acute osseous abnormality. IMPRESSION: 1. Mild to moderate diffuse emphysematous changes bilaterally. Interim finding of scattered areas of mild ground-glass density within the bilateral upper lobes suggesting foci of pneumonitis. Soft tissue thickening around the central  airways with central airspace disease, suggests central airways inflammation and possible pneumonia. No effusion. 2. Ectasia of the ascending aorta with focal aneurysmal dilatation of the descending thoracic aorta as previously described. Electronically Signed   By: Donavan Foil M.D.   On: 11/14/2015 23:37   Dg Chest Port 1 View  Result Date: 11/15/2015 CLINICAL DATA:  Acute onset of respiratory distress. Initial encounter. EXAM: PORTABLE CHEST 1 VIEW COMPARISON:  Chest radiograph and CT of the chest performed 11/14/2015 FINDINGS: Previously described scattered minimal opacities are better seen on recent CT. These could reflect pneumonitis. Mild vascular congestion is noted. No pleural effusion or pneumothorax is seen. The cardiomediastinal silhouette is normal in size. No acute osseous abnormalities are identified. Clips are seen overlying the left axilla. IMPRESSION: Mild vascular congestion noted. Previously described scattered minimal opacities are better seen on recent CT, and could reflect pneumonitis as previously noted. Electronically Signed   By: Garald Balding M.D.   On: 11/15/2015 00:46   Dg Chest Port 1 View  Result Date: 11/14/2015 CLINICAL DATA:  75 y/o  F; altered mental status. EXAM: PORTABLE CHEST 1 VIEW COMPARISON:  03/28/2014 chest radiograph.  Chest CT 10/03/2015. FINDINGS: Stable cardiac silhouette within normal limits given projection and technique. Aortic atherosclerosis with arch calcification. Prominent basilar reticular markings corresponding to mild fibrotic changes on prior CT chest. No  focal consolidation. No pneumothorax or pleural effusion. Left axillary surgical clips. No acute osseous abnormality identified IMPRESSION: No active disease. Electronically Signed   By: Kristine Garbe M.D.   On: 11/14/2015 22:36     STUDIES:  11/14/15 CT head>> Negative for any intracranial abnormality 11/14/15 CT chest>>Mild to moderate diffuse emphysematous changes  bilaterally.Interim finding of scattered areas of mild ground-glass densitywithin the bilateral upper lobes suggesting foci of pneumonitis.Soft tissue thickening around the central airways with centralairspace disease, suggests central airways inflammation and possible pneumonia. No effusion. CULTURES: 11/1 BC>> 11/1SC>>  11/1 UC>>  ANTIBIOTICS: 11/2 Ceftriaxone>> 11/2 Azithromycin>>  SIGNIFICANT EVENTS: 11/1  Patient admited to [name] with acute respiratory failure due to PNA, requiring BiPAP  LINES/TUBES: NONE   DISCUSSION: 89 Y O female with acute respiratory failure due to PNA , requiring BiPAP.  ASSESSMENT / PLAN:  PULMONARY A: Acute Hypoxic Respiratory failure Community Acquired PNA Copd  Tobacco abuse P:   Continue Bipap, wean as tolerated Continue Azithromycin/ceftriaxone Continue duoneb/dulera  CARDIOVASCULAR A:  Septic scock Congestive Heart Failure Hx of hypertension P:  Continuous telemetry Keep MAP goals>65 Neo syneohrine gtt Hold lisinopril /coreg Obtain ECHO RENAL A:   Acute kidney injury related to septic schock Hyponatremia P:   N/s '@50'$  ml/hour,( low EF- 35% Aug-2017) Replace electrolytes per [name] protocol   GASTROINTESTINAL A:   No active issues P:   Npo for now High risk for intubation  HEMATOLOGIC A:   No active issues P:  transfuse if Hgb<7 Heparin for DVT prophylaxis  INFECTIOUS A:   Community Acquired PNA P:   Monitor fever curve Lactic acid- 1.3 Follow procalcitonin Antibiotics as above Follow cultures.  ENDOCRINE A:   No active issues  P:   bs checks with BMP  NEUROLOGIC A:   Acute encephalopathy related to sepsis P:   Hold sedating medication Reorient freequently Rest per primary   FAMILY  - Updates:  [place] sonRonalee Belts ) was updated regarding her condition,  Yolanda Bonine wants her to be full code at this time.      Kipton Skillen,AG-ACNP Pulmonary and [place]   11/15/2015, 2:29 AM

## 2015-11-15 NOTE — Progress Notes (Signed)
Pharmacy Antibiotic Note  [name] [name] is a 75 y.o. female admitted on 11/14/2015 with pneumonia.  Pharmacy has been consulted for ceftriaxone dosing.  Plan: Ceftriaxone 1 gram q 24 hours ordered. Patient is also on azithromycin.  Height: '5\' 6"'$  (167.6 cm) Weight: 119 lb (54 kg) IBW/kg (Calculated) : 59.3  Temp (24hrs), Avg:99.7 F (37.6 C), Min:99.1 F (37.3 C), Max:100.2 F (37.9 C)   Recent Labs Lab 11/14/15 2131  WBC 13.0*  CREATININE 2.23*  LATICACIDVEN 1.3    Estimated Creatinine Clearance: 19.2 mL/min (by C-G formula based on SCr of 2.23 mg/dL (H)).    Allergies  Allergen Reactions  . Contrast Media [Iodinated Diagnostic Agents] Other (See Comments)    Pt was sent to the ED following contrast media injection at [place]. Unknown reason. She has been premedicated since without complications. Pt to be premedicated prior to contrast media injections  . Ioxaglate Other (See Comments)    Pt was sent to the ED following contrast media injection at [place]. Unknown reason. She has been premedicated since without complications. Pt to be premedicated prior to contrast media injections  . Sulfa Antibiotics     Other reaction(s): Other (See Comments)  . Tetracycline Hives and Other (See Comments)  . White Petrolatum Other (See Comments)  . Amoxicillin-Pot Clavulanate Rash    Blisters in mouth Other reaction(s): Unknown Blisters in mouth Blisters in mouth  . Tape Rash    Antimicrobials this admission: ceftriaxone 11/1 >>  Azithromycin  11/1 >>   Dose adjustments this admission:   Microbiology results: 11/1 BCx: pending 11/1 UCx: pending    Thank you for allowing pharmacy to be a part of this patient's care.  Deseray Daponte S 11/15/2015 2:53 AM

## 2015-11-15 NOTE — Progress Notes (Signed)
Hinton Dyer, NP notified of positive sputum cultures.

## 2015-11-15 NOTE — Progress Notes (Signed)
Right IJ triple lumen cleared for use by Hinton Dyer, NP.

## 2015-11-15 NOTE — Procedures (Signed)
Intubation Procedure Note [name] [name] [id number] 09/06/1940  Procedure: Intubation Indications: Respiratory insufficiency  Procedure Details Consent: Unable to obtain consent because of emergent medical necessity. Time Out: Verified patient identification, verified procedure, site/side was marked, verified correct patient position, special equipment/implants available, medications/allergies/relevent history reviewed, required imaging and test results available.  Performed  Drugs: 62mg Fentanyl,2 mg Versed,50  mg Rocuronium. ET- 7.5 with # 3 blade. Grade2view. ET tube visualized passing through vocal cords. Following intubation:  positive color change on ETCO2, condensation seen in endotracheal tube, equal breath sounds bilaterally.  Evaluation Hemodynamic Status: BP stable throughout; O2 sats: stable throughout Patient's Current Condition: stable Complications: No apparent complications Patient did tolerate procedure well. Chest X-ray ordered to verify placement.  CXR: pending.  Patient had copious amount of Purulent secretion which was suctioned numerous times prior to insertion.    [facility]

## 2015-11-15 NOTE — ED Notes (Addendum)
[name] NP Varughese at bedside. Was given verbal order to start Phenylephrine at 45 mcg/min. Verbal  order for NS fluids at 61m/hr.

## 2015-11-15 NOTE — H&P (Signed)
[facility] NAME: [name] [name]    MR#:  [id number]  DATE OF BIRTH:  1940-08-20  DATE OF ADMISSION:  11/14/2015  PRIMARY CARE PHYSICIAN: Tracie Harrier, MD   REQUESTING/REFERRING PHYSICIAN: Marcelene Butte, MD  CHIEF COMPLAINT:   Chief Complaint  Patient presents with  . Shortness of Breath  . Altered Mental Status    HISTORY OF PRESENT ILLNESS:  [name] [name]  is a 75 y.o. female who presents with Encephalopathy and shortness of breath. She was brought to the ED and found to be septic with pneumonia on CT scan. Hospitals were called for admission.  Shortly after evaluation admission she became acutely more hypoxic with O2 sats to the 60s and increased work of breathing. She was started on BiPAP and intensivists were consulted.  PAST MEDICAL HISTORY:   Past Medical History:  Diagnosis Date  . Asthma   . Breast cancer ([place]) 2009   left  . Cancer ([place])   . CHF (congestive heart failure) ([place])   . Chronic UTI   . COPD (chronic obstructive pulmonary disease) ([place])   . Dizziness   . Fibromyalgia   . Hypertension   . Neuropathy ([place])   . Personal history of tobacco use, presenting hazards to health 05/17/2015  . Polyp, larynx   . RA (rheumatoid arthritis) ([place])   . Sinus infection    recent  . Stumbling gait    to the left  . Supplemental oxygen dependent    2.5l    PAST SURGICAL HISTORY:   Past Surgical History:  Procedure Laterality Date  . ABDOMINAL HYSTERECTOMY    . BREAST LUMPECTOMY Left 2009   chemo and radiation  . CYST EXCISION Left 02/27/2015   Procedure: CYST REMOVAL;  Surgeon: Hessie Knows, MD;  Location: [facility] ORS;  Service: Orthopedics;  Laterality: Left;  . EYE MUSCLE SURGERY Right    13 surgeries  . THUMB ARTHROSCOPY Left     SOCIAL HISTORY:   Social History  Substance Use Topics  . Smoking status: Current Every Day Smoker    Packs/day: 0.75    Years: 40.00    Types: Cigarettes  . Smokeless  tobacco: Never Used  . Alcohol use No    FAMILY HISTORY:   Family History  Problem Relation Age of Onset  . Diabetes Father   . Stroke Father   . Heart attack Father   . CAD Sister     DRUG ALLERGIES:   Allergies  Allergen Reactions  . Contrast Media [Iodinated Diagnostic Agents] Other (See Comments)    Pt was sent to the ED following contrast media injection at [place]. Unknown reason. She has been premedicated since without complications. Pt to be premedicated prior to contrast media injections  . Ioxaglate Other (See Comments)    Pt was sent to the ED following contrast media injection at [place] [facility]. Unknown reason. She has been premedicated since without complications. Pt to be premedicated prior to contrast media injections  . Sulfa Antibiotics     Other reaction(s): Other (See Comments)  . Tetracycline Hives and Other (See Comments)  . White Petrolatum Other (See Comments)  . Amoxicillin-Pot Clavulanate Rash    Blisters in mouth Other reaction(s): Unknown Blisters in mouth Blisters in mouth  . Tape Rash    MEDICATIONS AT HOME:   Prior to Admission medications   Medication Sig Start Date End Date Taking? Authorizing Provider  albuterol (PROVENTIL HFA;VENTOLIN HFA) 108 (90 BASE) MCG/ACT inhaler  Inhale into the lungs every 6 (six) hours as needed for wheezing or shortness of breath.    Historical Provider, MD  albuterol-ipratropium (COMBIVENT) 18-103 MCG/ACT inhaler Inhale into the lungs every 4 (four) hours.    Historical Provider, MD  albuterol-ipratropium (COMBIVENT) 18-103 MCG/ACT inhaler Inhale into the lungs.    Historical Provider, MD  albuterol-ipratropium (COMBIVENT) 18-103 MCG/ACT inhaler Inhale into the lungs.    Historical Provider, MD  budesonide-formoterol (SYMBICORT) 160-4.5 MCG/ACT inhaler Inhale 2 puffs into the lungs 2 (two) times daily.    Historical Provider, MD  Calcium Carb-Cholecalciferol (CALCIUM + D3) 600-200 MG-UNIT TABS Take  by mouth.    Historical Provider, MD  Calcium Carbonate-Vitamin D (CALCIUM 600+D) 600-200 MG-UNIT TABS Take by mouth.    Historical Provider, MD  calcium-vitamin D (OSCAL WITH D) 500-200 MG-UNIT per tablet Take 1 tablet by mouth at bedtime.    Historical Provider, MD  carvedilol (COREG) 3.125 MG tablet Take 6.25 mg by mouth. 03/10/14   Historical Provider, MD  carvedilol (COREG) 6.25 MG tablet Take 6.25 mg by mouth 2 (two) times daily with a meal.    Historical Provider, MD  clonazePAM (KLONOPIN) 1 MG tablet Take 1 mg by mouth 2 (two) times daily.    Historical Provider, MD  furosemide (LASIX) 20 MG tablet Take 20 mg by mouth every other day.    Historical Provider, MD  furosemide (LASIX) 20 MG tablet  06/28/14   Historical Provider, MD  gabapentin (NEURONTIN) 600 MG tablet Take 600 mg by mouth 3 (three) times daily.    Historical Provider, MD  HYDROcodone-acetaminophen (NORCO) 5-325 MG tablet Take 1 tablet by mouth every 6 (six) hours as needed for moderate pain. 02/27/15   Hessie Knows, MD  HYDROcodone-acetaminophen (NORCO/VICODIN) 5-325 MG tablet Take 1 tablet by mouth every 8 (eight) hours as needed for moderate pain.    Historical Provider, MD  imipramine (TOFRANIL) 25 MG tablet Take 1 tablet by mouth daily. 09/21/15   Historical Provider, MD  lisinopril (PRINIVIL,ZESTRIL) 2.5 MG tablet Take 2.5 mg by mouth daily.    Historical Provider, MD  Multiple Vitamin (MULTIVITAMIN) tablet Take by mouth.    Historical Provider, MD  multivitamin-iron-minerals-folic acid (CENTRUM) chewable tablet Chew 1 tablet by mouth daily.    Historical Provider, MD  nitroGLYCERIN (NITROSTAT) 0.4 MG SL tablet Place under the tongue.    Historical Provider, MD  promethazine (PHENERGAN) 12.5 MG tablet Take by mouth.    Historical Provider, MD  promethazine (PHENERGAN) 12.5 MG tablet Take 12.5 mg by mouth.    Historical Provider, MD  traMADol (ULTRAM) 50 MG tablet Take 50 mg by mouth daily.    Historical Provider, MD   zolpidem (AMBIEN) 5 MG tablet Take 5 mg by mouth at bedtime as needed for sleep.    Historical Provider, MD    REVIEW OF SYSTEMS:  Review of Systems  Unable to perform ROS: Acuity of condition     VITAL SIGNS:   Vitals:   11/14/15 2157 11/14/15 2200 11/14/15 2224 11/14/15 2230  BP:  (!) 87/60 (!) 94/53 (!) 95/46  Pulse:  95 91 89  Resp:  14 (!) 22 17  Temp: 100.2 F (37.9 C)     TempSrc: Rectal     SpO2:  93% 93% 96%  Weight:      Height:       Wt Readings from Last 3 Encounters:  11/14/15 54 kg (119 lb)  07/19/15 54.4 kg (120 lb)  05/10/15 56.1 kg (123  lb 10.9 oz)    PHYSICAL EXAMINATION:  Physical Exam  Vitals reviewed. Constitutional: She appears well-developed and well-nourished. No distress.  HENT:  Head: Normocephalic and atraumatic.  Mouth/Throat: Oropharynx is clear and moist.  Eyes: Conjunctivae and EOM are normal. Pupils are equal, round, and reactive to light. No scleral icterus.  Neck: Normal range of motion. Neck supple. No JVD present. No thyromegaly present.  Cardiovascular: Normal rate, regular rhythm and intact distal pulses.  Exam reveals no gallop and no friction rub.   No murmur heard. Respiratory: She is in respiratory distress. She has no wheezes. She has no rales.  Bilateral coarse gurgling sounds  GI: Soft. Bowel sounds are normal. She exhibits no distension. There is no tenderness.  Musculoskeletal: Normal range of motion. She exhibits no edema.  No arthritis, no gout  Lymphadenopathy:    She has no cervical adenopathy.  Neurological:  Unable to assess due to patient condition  Skin: Skin is warm and dry. No rash noted. No erythema.  Psychiatric:  Unable to assess due to patient condition    LABORATORY PANEL:   CBC  Recent Labs Lab 11/14/15 2131  WBC 13.0*  HGB 13.9  HCT 40.2  PLT 229   ------------------------------------------------------------------------------------------------------------------  Chemistries   Recent  Labs Lab 11/14/15 2131  NA 127*  K 3.5  CL 88*  CO2 27  GLUCOSE 115*  BUN 13  CREATININE 2.23*  CALCIUM 9.6  AST 18  ALT 9*  ALKPHOS 98  BILITOT 0.4   ------------------------------------------------------------------------------------------------------------------  Cardiac Enzymes No results for input(s): TROPONINI in the last 168 hours. ------------------------------------------------------------------------------------------------------------------  RADIOLOGY:  Ct Head Wo Contrast  Result Date: 11/14/2015 CLINICAL DATA:  Initial evaluation for acute altered mental status. Recent fall. EXAM: CT HEAD WITHOUT CONTRAST TECHNIQUE: Contiguous axial images were obtained from the base of the skull through the vertex without intravenous contrast. COMPARISON:  None. FINDINGS: Brain: Generalized age-related cerebral atrophy. Mild chronic microvascular ischemic disease. No acute intracranial hemorrhage. No evidence for acute large vessel territory infarct. No mass lesion, midline shift or mass effect. No extra-axial fluid collection. Mild prominence of the extra-axial space overlying the bilateral frontal convexities felt to be related atrophy. No hydrocephalus. Vascular: No hyperdense vessel. Scattered vascular calcifications noted within the carotid siphons. Skull: Scalp soft tissues within normal limits.  Calvarium intact. Sinuses/Orbits: Globes and orbital soft tissues within normal limits. Sequela prior sinus surgery noted. Scattered mucosal thickening within the right maxillary sinus, ethmoidal air cells, and sphenoid sinuses. Trace bilateral mastoid effusions. IMPRESSION: 1. No acute intracranial process identified. 2. Age-related cerebral atrophy with mild chronic microvascular ischemic disease. 3. Chronic paranasal sinus disease with sequela prior sinonasal surgery. Electronically Signed   By: Jeannine Boga M.D.   On: 11/14/2015 23:27   Ct Chest Wo Contrast  Result Date:  11/14/2015 CLINICAL DATA:  Altered mental status, fall 6 with congestion hypoxia decreased O2 sats EXAM: CT CHEST WITHOUT CONTRAST TECHNIQUE: Multidetector CT imaging of the chest was performed following the standard protocol without IV contrast. COMPARISON:  Chest x-ray 11/14/2015, CT chest 10/03/2015 FINDINGS: Cardiovascular: Limited without intravenous contrast. Densely calcified thoracic aorta. Ectasia of the ascending aorta, measuring up to 3.3 Cm. Focal aneurysmal dilatation of the descending thoracic aorta, measuring up to 4.2 cm, 4.2 cm previously. No gross mediastinal hematoma. Extensive coronary artery calcifications. Aortic valvular calcifications. Mitral valvular calcifications. Heart size upper normal. No gross diffusion. Mediastinum/Nodes: No pathologically enlarged mediastinal or axillary nodes. Trachea and mainstem bronchi appear within normal limits. Imaged  thyroid gland unremarkable. Mild air distention of the upper esophagus. Lungs/Pleura: Moderate diffuse bilateral emphysematous changes. Mild interstitial thickening within the upper lobes with mild hazy opacity in the posterior right upper lobe since the prior study. Scattered areas of mild ground-glass density within the bilateral upper lobes. Interim finding of soft tissue thickening around the central airways with central airspace disease. No nodules. No effusion. Upper Abdomen: Upper abdominal images demonstrate a few dilated fluid-filled loops of bowel. Musculoskeletal: Rim calcification under the left breast. Degenerative changes. No acute osseous abnormality. IMPRESSION: 1. Mild to moderate diffuse emphysematous changes bilaterally. Interim finding of scattered areas of mild ground-glass density within the bilateral upper lobes suggesting foci of pneumonitis. Soft tissue thickening around the central airways with central airspace disease, suggests central airways inflammation and possible pneumonia. No effusion. 2. Ectasia of the ascending  aorta with focal aneurysmal dilatation of the descending thoracic aorta as previously described. Electronically Signed   By: Donavan Foil M.D.   On: 11/14/2015 23:37   Dg Chest Port 1 View  Result Date: 11/14/2015 CLINICAL DATA:  75 y/o  F; altered mental status. EXAM: PORTABLE CHEST 1 VIEW COMPARISON:  03/28/2014 chest radiograph.  Chest CT 10/03/2015. FINDINGS: Stable cardiac silhouette within normal limits given projection and technique. Aortic atherosclerosis with arch calcification. Prominent basilar reticular markings corresponding to mild fibrotic changes on prior CT chest. No focal consolidation. No pneumothorax or pleural effusion. Left axillary surgical clips. No acute osseous abnormality identified IMPRESSION: No active disease. Electronically Signed   By: Kristine Garbe M.D.   On: 11/14/2015 22:36    EKG:   Orders placed or performed during the hospital encounter of 11/14/15  . ED EKG 12-Lead  . ED EKG 12-Lead  . EKG 12-Lead  . EKG 12-Lead    IMPRESSION AND PLAN:  Principal Problem:   Sepsis ([place]) - IV antibiotics started in the ED and continued on admission, cultures sent from the ED, lactic acid was normal, blood pressure stable. Sepsis is from pneumonia as below.  Consult intensivist for potential [name] care Active Problems:   CAP (community acquired pneumonia) - seen on CT scan and corroborated on physical exam, question aspiration given her repeat event here in the ED tonight. She is being started on BiPAP   Hypotension - IV fluids for blood pressure support   AKI (acute kidney injury) ([place]) - likely related to her sepsis and hypotension, IV fluids as above, avoid nephrotoxins, monitor for improvement   COPD (chronic obstructive pulmonary disease) ([place]) - continue home inhalers   Chronic systolic CHF (congestive heart failure) ([place]) - repeat chest x-ray in the ED did not show significant increase in effusions or edema, we'll continue with gentle IV fluid support as  above  All the records are reviewed and case discussed with ED provider. Management plans discussed with the patient and/or family.  DVT PROPHYLAXIS: SubQ heparin  GI PROPHYLAXIS: None  ADMISSION STATUS: Inpatient  CODE STATUS: Full Code Status History    This patient does not have a recorded code status. Please follow your organizational policy for patients in this situation.      TOTAL TIME TAKING CARE OF THIS PATIENT: 45 minutes.    Sahalie Beth [place] 11/15/2015, 12:20 AM  [facility]  Office  [phone]  CC: Primary care physician; Tracie Harrier, MD

## 2015-11-15 NOTE — ED Notes (Signed)
RT the bedside justing suction to removed saliva. NRB still in place. Will continue to monitor. RT to place BIPAP.

## 2015-11-15 NOTE — ED Notes (Signed)
[name] MD paged.

## 2015-11-15 NOTE — ED Notes (Signed)
Spoke with [name] and updated on pt's current decline in blood pressure. MD made me aware he would be placing new orders and his NP would be in to make an assessment.

## 2015-11-15 NOTE — Progress Notes (Signed)
Initial Nutrition Assessment  DOCUMENTATION CODES:   Not applicable  INTERVENTION:  -Received verbal order from MD Kasa to start TF. Recommend starting Vital High Protein at goal rate of 50 ml/hr providing 1200 kcals, 106 g protein and 1008 mL of free water. Maintenance free water flushes at this time. Adult Tube Feeding Protocol ordered; PEPuP initiated.   NUTRITION DIAGNOSIS:   Inadequate oral intake related to acute illness as evidenced by NPO status.  GOAL:   Provide needs based on ASPEN/SCCM guidelines  MONITOR:   Vent status, Labs, Weight trends, TF tolerance  REASON FOR ASSESSMENT:   Ventilator    ASSESSMENT:   75 yo female admitted with acute respiratory failure from COPD exacerbation and pneumonia requiring intubation on 11/2.    OG tube coiled in stomach per chest xray this AM  Patient is currently intubated on ventilator support MV: 6 L/min Temp (24hrs), Avg:99.4 F (37.4 C), Min:98.5 F (36.9 C), Max:100.2 F (37.9 C)   Past Medical History:  Diagnosis Date  . Asthma   . Breast cancer ([place]) 2009   left  . Cancer ([place])   . CHF (congestive heart failure) ([place])   . Chronic UTI   . COPD (chronic obstructive pulmonary disease) ([place])   . Dizziness   . Fibromyalgia   . Hypertension   . Neuropathy ([place])   . Personal history of tobacco use, presenting hazards to health 05/17/2015  . Polyp, larynx   . RA (rheumatoid arthritis) ([place])   . Sinus infection    recent  . Stumbling gait    to the left  . Supplemental oxygen dependent    2.5l   Diet Order:  Diet NPO time specified  Skin:  Reviewed, no issues  Last BM:  no documented BM  Labs: sodium 128, potassium 3.3, Creatinine 2.19, BUN wdl  Meds: NS at 50 ml/hr  Height:   Ht Readings from Last 1 Encounters:  11/15/15 '5\' 5"'$  (1.651 m)    Weight:   Wt Readings from Last 1 Encounters:  11/15/15 119 lb 0.8 oz (54 kg)    Filed Weights   11/14/15 2124 11/15/15 0203  Weight: 119 lb (54 kg)  119 lb 0.8 oz (54 kg)    BMI:  Body mass index is 19.81 kg/m.  Estimated Nutritional Needs:   Kcal:  1257 kcals  Protein:  65-108 g  Fluid:  >/= 1.5 L  EDUCATION NEEDS:   No education needs identified at this time  [place], [place], [place] [phone] Pager  [phone] Weekend/On-Call Pager

## 2015-11-15 NOTE — ED Provider Notes (Signed)
-----------------------------------------   12:29 AM on 11/15/2015 -----------------------------------------  Called to patient's bedside for saturations 62%. Audible rales and gurgling. Patient with decreased level of consciousness but awakens with painful stimuli. Will repeat portable chest x-ray, obtain ABG, initiate patient on BiPAP.  ----------------------------------------- 12:57 AM on 11/15/2015 -----------------------------------------  Repeat chest x-ray appears to be volume overloaded. Discussed with Dr. Alva Garnet (intensivist) who accepts patient for CCU admission. Patient appears to be improved on BiPAP.   Paulette Blanch, MD 11/15/15 [phone]

## 2015-11-16 ENCOUNTER — Inpatient Hospital Stay: Payer: Commercial Managed Care - HMO

## 2015-11-16 DIAGNOSIS — N179 Acute kidney failure, unspecified: Secondary | ICD-10-CM

## 2015-11-16 DIAGNOSIS — R400 Somnolence: Secondary | ICD-10-CM

## 2015-11-16 LAB — PHOSPHORUS
PHOSPHORUS: 2.3 mg/dL — AB (ref 2.5–4.6)
PHOSPHORUS: 2.3 mg/dL — AB (ref 2.5–4.6)

## 2015-11-16 LAB — BASIC METABOLIC PANEL
ANION GAP: 8 (ref 5–15)
BUN: 17 mg/dL (ref 6–20)
CALCIUM: 9 mg/dL (ref 8.9–10.3)
CO2: 24 mmol/L (ref 22–32)
CREATININE: 1.18 mg/dL — AB (ref 0.44–1.00)
Chloride: 99 mmol/L — ABNORMAL LOW (ref 101–111)
GFR, EST AFRICAN AMERICAN: 52 mL/min — AB (ref >60)
GFR, EST NON AFRICAN AMERICAN: 45 mL/min — AB (ref >60)
Glucose, Bld: 208 mg/dL — ABNORMAL HIGH (ref 65–99)
Potassium: 4 mmol/L (ref 3.5–5.1)
SODIUM: 131 mmol/L — AB (ref 135–145)

## 2015-11-16 LAB — ECHOCARDIOGRAM COMPLETE
Height: 65 in
Weight: 1904.77 oz

## 2015-11-16 LAB — BLOOD GAS, ARTERIAL
ACID-BASE DEFICIT: 4.2 mmol/L — AB (ref 0.0–2.0)
Acid-base deficit: 2.2 mmol/L — ABNORMAL HIGH (ref 0.0–2.0)
BICARBONATE: 24.2 mmol/L (ref 20.0–28.0)
BICARBONATE: 25.5 mmol/L (ref 20.0–28.0)
FIO2: 0.4
FIO2: 0.55
Mechanical Rate: 15
O2 SAT: 97.6 %
O2 Saturation: 98.1 %
PATIENT TEMPERATURE: 37
PATIENT TEMPERATURE: 37
PEEP/CPAP: 5 cmH2O
PH ART: 7.32 — AB (ref 7.350–7.450)
PO2 ART: 105 mmHg (ref 83.0–108.0)
PO2 ART: 129 mmHg — AB (ref 83.0–108.0)
VT: 500 mL
pCO2 arterial: 47 mmHg (ref 32.0–48.0)
pCO2 arterial: 70 mmHg (ref 32.0–48.0)
pH, Arterial: 7.17 — CL (ref 7.350–7.450)

## 2015-11-16 LAB — GLUCOSE, CAPILLARY
GLUCOSE-CAPILLARY: 177 mg/dL — AB (ref 65–99)
Glucose-Capillary: 119 mg/dL — ABNORMAL HIGH (ref 65–99)
Glucose-Capillary: 136 mg/dL — ABNORMAL HIGH (ref 65–99)
Glucose-Capillary: 174 mg/dL — ABNORMAL HIGH (ref 65–99)
Glucose-Capillary: 181 mg/dL — ABNORMAL HIGH (ref 65–99)
Glucose-Capillary: 206 mg/dL — ABNORMAL HIGH (ref 65–99)

## 2015-11-16 LAB — CBC WITH DIFFERENTIAL/PLATELET
BASOS ABS: 0 10*3/uL (ref 0–0.1)
BASOS PCT: 0 %
EOS ABS: 0 10*3/uL (ref 0–0.7)
EOS PCT: 0 %
HEMATOCRIT: 34.3 % — AB (ref 35.0–47.0)
Hemoglobin: 11.5 g/dL — ABNORMAL LOW (ref 12.0–16.0)
Lymphocytes Relative: 3 %
Lymphs Abs: 0.4 10*3/uL — ABNORMAL LOW (ref 1.0–3.6)
MCH: 31.3 pg (ref 26.0–34.0)
MCHC: 33.7 g/dL (ref 32.0–36.0)
MCV: 92.9 fL (ref 80.0–100.0)
MONO ABS: 0.2 10*3/uL (ref 0.2–0.9)
Monocytes Relative: 2 %
NEUTROS ABS: 11.6 10*3/uL — AB (ref 1.4–6.5)
Neutrophils Relative %: 95 %
PLATELETS: 233 10*3/uL (ref 150–440)
RBC: 3.69 MIL/uL — ABNORMAL LOW (ref 3.80–5.20)
RDW: 12.7 % (ref 11.5–14.5)
WBC: 12.2 10*3/uL — ABNORMAL HIGH (ref 3.6–11.0)

## 2015-11-16 LAB — URINE CULTURE: Culture: NO GROWTH

## 2015-11-16 LAB — MAGNESIUM
MAGNESIUM: 1.7 mg/dL (ref 1.7–2.4)
Magnesium: 1.7 mg/dL (ref 1.7–2.4)

## 2015-11-16 LAB — TRIGLYCERIDES: TRIGLYCERIDES: 72 mg/dL (ref <150)

## 2015-11-16 LAB — PROCALCITONIN: PROCALCITONIN: 0.25 ng/mL

## 2015-11-16 MED ORDER — PROPOFOL 1000 MG/100ML IV EMUL
5.0000 ug/kg/min | INTRAVENOUS | Status: DC
  Administered 2015-11-16: 10 ug/kg/min via INTRAVENOUS
  Administered 2015-11-16: 20 ug/kg/min via INTRAVENOUS
  Administered 2015-11-16: 45 ug/kg/min via INTRAVENOUS
  Administered 2015-11-16: 10 ug/kg/min via INTRAVENOUS
  Administered 2015-11-17: 35 ug/kg/min via INTRAVENOUS
  Filled 2015-11-16 (×2): qty 100

## 2015-11-16 MED ORDER — INSULIN ASPART 100 UNIT/ML ~~LOC~~ SOLN
1.0000 [IU] | SUBCUTANEOUS | Status: DC
  Administered 2015-11-16 – 2015-11-17 (×3): 2 [IU] via SUBCUTANEOUS
  Administered 2015-11-17: 1 [IU] via SUBCUTANEOUS
  Filled 2015-11-16 (×2): qty 2
  Filled 2015-11-16: qty 1
  Filled 2015-11-16: qty 2

## 2015-11-16 MED ORDER — CEFTRIAXONE SODIUM-DEXTROSE 1-3.74 GM-% IV SOLR
1.0000 g | Freq: Every day | INTRAVENOUS | Status: AC
  Administered 2015-11-16 – 2015-11-21 (×6): 1 g via INTRAVENOUS
  Filled 2015-11-16 (×7): qty 50

## 2015-11-16 NOTE — Progress Notes (Signed)
Nutrition Follow-up  DOCUMENTATION CODES:   Not applicable  INTERVENTION:  -Recommend continuing Vital High Protein at 50 ml/hr at present; will need to adjust rate to account for calories from diprivan on follow. Continue to assess  NUTRITION DIAGNOSIS:   Inadequate oral intake related to acute illness as evidenced by NPO status.  Being addressed via TF  GOAL:   Provide needs based on ASPEN/SCCM guidelines  MONITOR:   Vent status, Labs, Weight trends, TF tolerance  REASON FOR ASSESSMENT:   Ventilator    ASSESSMENT:   75 yo female admitted with acute respiratory failure from COPD exacerbation and pneumonia requiring intubation on 11/2.   Patient is currently intubated on ventilator support, no plans for weaning today MV: 8 L/min Temp (24hrs), Avg:98.8 F (37.1 C), Min:98.4 F (36.9 C), Max:99.2 F (37.3 C)  Pt tolerating Vital High Protein at 50 ml/hr via OG tube in stomach Sodium improving (131), NS at 50 ml/hr, Sedated on fentanyl, adding diprivan today.  Bowel regimen initiated yesterday, no BM since admission  Diet Order:  Diet NPO time specified  Skin:  Reviewed, no issues  Last BM:  no documented BM  Height:   Ht Readings from Last 1 Encounters:  11/15/15 '5\' 5"'$  (1.651 m)    Weight:   Wt Readings from Last 1 Encounters:  11/16/15 112 lb 3.4 oz (50.9 kg)    BMI:  Body mass index is 18.67 kg/m.  Estimated Nutritional Needs:   Kcal:  1257 kcals  Protein:  65-108 g  Fluid:  >/= 1.5 L  EDUCATION NEEDS:   No education needs identified at this time  [place], [place], [place] [phone] Pager  [phone] Weekend/On-Call Pager

## 2015-11-16 NOTE — Progress Notes (Signed)
PULMONARY / CRITICAL CARE MEDICINE   Name: [name] [name] MRN: [id number] DOB: 1940-06-27    ADMISSION DATE:  11/14/2015 CONSULTATION DATE:  11/15/15  REFERRING MD:  Dr Jannifer Franklin  CHIEF COMPLAINT: Acute Shortness of breath  Patient Profile [name] [name] is a41 yo female with medical history significant for Asthma, Breast Cancer(2009), CHF, COPD, Fibromyalgia, hypertension and  tobacco abuse.  Patient presented to ED via EMS with altered mental status and shortness of breath.  Apparently she fell and became increasingly confused.  Patient is on home -O2 but most of the times she is non compliant.  Her CT chest was concering for PNA.  Patient apparently became more hypoxic and was placed on BiPAP.  PCCM team was consulted for further recommendation.   SIGNIFICANT EVENTS: 11/1  Patient admited to [name] with acute respiratory failure due to PNA, requiring BiPAP 11/2 Intubated and mechanically ventillated  SUBJECTIVE:  Patient remains lightly sedated, intubated and getting mechanically ventilated. Was afebrile overnight.  Is requiring very low dose of pressors to maintain hemodynamic status, overall making some significant improvement.  VITAL SIGNS: BP (!) 94/45   Pulse 83   Temp 98.8 F (37.1 C) (Oral)   Resp 15   Ht '5\' 5"'$  (1.651 m) Comment: estimated  Wt 54 kg (119 lb 0.8 oz)   SpO2 98%   BMI 19.81 kg/m   HEMODYNAMICS:    VENTILATOR SETTINGS: Vent Mode: PRVC FiO2 (%):  [40 %-60 %] 55 % Set Rate:  [15 bmp] 15 bmp Vt Set:  [400 mL-500 mL] 500 mL PEEP:  [5 cmH20] 5 cmH20 Plateau Pressure:  [18 cmH20-19 cmH20] 19 cmH20  INTAKE / OUTPUT: I/O last 3 completed shifts: In: 5926.4 [I.V.:1764.4; NG/GT:28.7; IV Piggyback:4133.3] Out: 1325 [Urine:1325]  PHYSICAL EXAMINATION: General:  Elderly white female, intubated and mechanically ventillated Neuro: opens eyes to voice, follows simple commands HEENT:  Atraumatic, normocephalic, no discharge, no JVD appreciated Cardiovascular:  S1,  S2, regular rate and rhythm, no MRG noted Lungs: diminished bibasilar, no wheezes,cracckles, rhonchi noted Abdomen: soft and non Tender, no guarding noted Musculoskeletal:  No inflammation/deformity noted Skin:  Grossly intact  LABS:  BMET  Recent Labs Lab 11/14/15 2131 11/15/15 0418  NA 127* 128*  K 3.5 3.3*  CL 88* 95*  CO2 27 26  BUN 13 13  CREATININE 2.23* 2.19*  GLUCOSE 115* 98    Electrolytes  Recent Labs Lab 11/14/15 2131 11/15/15 0418 11/15/15 1421 11/15/15 1726  CALCIUM 9.6 8.2*  --   --   MG  --  1.8 1.7 1.7  PHOS  --  4.1 3.6 3.2    CBC  Recent Labs Lab 11/14/15 2131 11/15/15 0418  WBC 13.0* 11.6*  HGB 13.9 11.8*  HCT 40.2 35.2  PLT 229 207    Coag's No results for input(s): APTT, INR in the last 168 hours.  Sepsis Markers  Recent Labs Lab 11/14/15 2131  LATICACIDVEN 1.3  PROCALCITON 0.13    ABG  Recent Labs Lab 11/15/15 0536 11/15/15 0615 11/15/15 1100  PHART 7.18* 7.19* 7.24*  PCO2ART 61* 66* 55*  PO2ART 70* 47* 60*    Liver Enzymes  Recent Labs Lab 11/14/15 2131  AST 18  ALT 9*  ALKPHOS 98  BILITOT 0.4  ALBUMIN 3.9    Cardiac Enzymes No results for input(s): TROPONINI, PROBNP in the last 168 hours.  Glucose  Recent Labs Lab 11/15/15 0237 11/15/15 1512 11/15/15 1941 11/15/15 2346  GLUCAP 145* 166* 198* 182*    Imaging Dg  Chest 1 View  Result Date: 11/15/2015 CLINICAL DATA:  Central line placement, asthma, LEFT breast cancer, its CHF, COPD, hypertension, smoker EXAM: CHEST 1 VIEW COMPARISON:  Portable exam 0732 hours compared to 0640 hours FINDINGS: Tip of endotracheal tube projects 3.6 cm above carina. Nasogastric tube coiled in proximal stomach. RIGHT jugular central venous catheter tip ribs projects over cavoatrial junction. Stable heart size, mediastinal contours and pulmonary vascularity. Slight chronic accentuation of interstitial markings, stable. No definite acute infiltrate, pleural effusion or  pneumothorax. Gaseous distention of stomach. Osseous demineralization. Atherosclerotic calcification aorta. IMPRESSION: No pneumothorax following RIGHT jugular line placement. Remainder of exam unchanged. Aortic atherosclerosis. Electronically Signed   By: Lavonia Dana M.D.   On: 11/15/2015 07:57   Portable Chest Xray  Result Date: 11/15/2015 CLINICAL DATA:  Respiratory failure. EXAM: PORTABLE CHEST 1 VIEW COMPARISON:  11/15/2015 at 00:32 FINDINGS: Endotracheal tube is 3.5 cm above the carina. The lungs are clear. Pulmonary vasculature is normal. No large effusions. No pneumothorax. IMPRESSION: Satisfactory ET tube position.  No consolidation.  No effusion. Electronically Signed   By: Andreas Newport M.D.   On: 11/15/2015 06:57     STUDIES:  11/14/15 CT head>> Negative for any intracranial abnormality 11/14/15 CT chest>>Mild to moderate diffuse emphysematous changes bilaterally.Interim finding of scattered areas of mild ground-glass densitywithin the bilateral upper lobes suggesting foci of pneumonitis.Soft tissue thickening around the central airways with centralairspace disease, suggests central airways inflammation and possible pneumonia. No effusion. CULTURES: 11/1 BC>> 11/1SC>>  11/1 UC>>  ANTIBIOTICS: 11/2 Ceftriaxone>> 11/2 Azithromycin>> LINES/TUBES: NONE    ASSESSMENT / PLAN:  PULMONARY A: Acute Hypoxic Respiratory failure Community Acquired PNA Copd  Tobacco abuse P:   Full vent support, wean as tolerated SBT trials in am Routine ABG Continue Azithromycin/ceftriaxone Continue duoneb/dulera CXR in am  CARDIOVASCULAR A:  Septic scock Congestive Heart Failure Hx of hypertension P:  Continuous telemetry Keep MAP goals>65 Neo syneohrine gtt Hold lisinopril /coreg Obtain ECHO RENAL A:   Acute kidney injury related to septic schock Hyponatremia P:   N/s '@50'$  ml/hour,( low EF- 35% Aug-2017) Replace electrolytes per [name] protocol   GASTROINTESTINAL A:   No  active issues P:   Npo for now High risk for intubation  HEMATOLOGIC A:   No active issues P:  transfuse if Hgb<7 Heparin for DVT prophylaxis  INFECTIOUS A:   Community Acquired PNA P:   Monitor fever curve Antibiotics as above Follow cultures.  ENDOCRINE A:   No active issues  P:   bs checks with BMP  NEUROLOGIC A:   Acute encephalopathy related to sepsis P:   Hold sedating medication Reorient freequently     Bincy Varughese,AG-ACNP Pulmonary and [place]   11/16/2015, 2:44 AM

## 2015-11-16 NOTE — Progress Notes (Signed)
[facility] NOTE  Pharmacy Consult for Constipation Prevention/Ceftriaxone Dosing Indication: Sedation with Fentanyl/AECOPD and PNA   Allergies  Allergen Reactions  . Contrast Media [Iodinated Diagnostic Agents] Other (See Comments)    Pt was sent to the ED following contrast media injection at [place]. Unknown reason. She has been premedicated since without complications. Pt to be premedicated prior to contrast media injections  . Ioxaglate Other (See Comments)    Pt was sent to the ED following contrast media injection at [place]. Unknown reason. She has been premedicated since without complications. Pt to be premedicated prior to contrast media injections  . Sulfa Antibiotics     Other reaction(s): Other (See Comments)  . Tetracycline Hives and Other (See Comments)  . White Petrolatum Other (See Comments)  . Amoxicillin-Pot Clavulanate Rash    Blisters in mouth Other reaction(s): Unknown Blisters in mouth Blisters in mouth  . Tape Rash    Patient Measurements: Height: '5\' 5"'$  (165.1 cm) (estimated) Weight: 112 lb 3.4 oz (50.9 kg) IBW/kg (Calculated) : 57  Vital Signs: Temp: 98.4 F (36.9 C) (11/03 0800) Temp Source: Oral (11/03 0800) BP: 118/54 (11/03 0800) Pulse Rate: 76 (11/03 0600) Intake/Output from previous day: 11/02 0701 - 11/03 0700 In: 2777.4 [I.V.:2168.7; NG/GT:508.7; IV Piggyback:100] Out: 2245 [Urine:2245] Intake/Output from this shift: Total I/O In: -  Out: 50 [Urine:50] Vent settings for last 24 hours: Vent Mode: PRVC FiO2 (%):  [50 %-60 %] 50 % Set Rate:  [15 bmp] 15 bmp Vt Set:  [500 mL] 500 mL PEEP:  [5 cmH20] 5 cmH20 Plateau Pressure:  [18 cmH20-19 cmH20] 19 cmH20  Labs:  Recent Labs  11/14/15 2131  11/15/15 0418 11/15/15 1421 11/15/15 1726 11/16/15 0435  WBC 13.0*  --  11.6*  --   --  12.2*  HGB 13.9  --  11.8*  --   --  11.5*  HCT 40.2  --  35.2  --   --  34.3*  PLT 229  --  207  --   --   233  CREATININE 2.23*  --  2.19*  --   --  1.18*  MG  --   < > 1.8 1.7 1.7 1.7  PHOS  --   < > 4.1 3.6 3.2 2.3*  ALBUMIN 3.9  --   --   --   --   --   PROT 8.1  --   --   --   --   --   AST 18  --   --   --   --   --   ALT 9*  --   --   --   --   --   ALKPHOS 98  --   --   --   --   --   BILITOT 0.4  --   --   --   --   --   < > = values in this interval not displayed. Estimated Creatinine Clearance: 34.1 mL/min (by C-G formula based on SCr of 1.18 mg/dL (H)).   Recent Labs  11/15/15 2346 11/16/15 0406 11/16/15 0741  GLUCAP 182* 181* 206*    Microbiology: Recent Results (from the past 720 hour(s))  Blood Culture (routine x 2)     Status: None (Preliminary result)   Collection Time: 11/14/15  9:31 PM  Result Value Ref Range Status   Specimen Description BLOOD LT FOREARM  Final   Special Requests BOTTLES DRAWN AEROBIC AND ANAEROBIC  [place]  Final   Culture NO GROWTH 2 DAYS  Final   Report Status PENDING  Incomplete  Blood Culture (routine x 2)     Status: None (Preliminary result)   Collection Time: 11/14/15  9:31 PM  Result Value Ref Range Status   Specimen Description BLOOD LEFT AC  Final   Special Requests BOTTLES DRAWN AEROBIC AND ANAEROBIC ANA5CC AER [place]  Final   Culture NO GROWTH 2 DAYS  Final   Report Status PENDING  Incomplete  Urine culture     Status: None   Collection Time: 11/14/15  9:31 PM  Result Value Ref Range Status   Specimen Description URINE, RANDOM  Final   Special Requests NONE  Final   Culture NO GROWTH Performed at [facility]   Final   Report Status 11/16/2015 FINAL  Final  MRSA PCR Screening     Status: None   Collection Time: 11/15/15  2:36 AM  Result Value Ref Range Status   MRSA by PCR NEGATIVE NEGATIVE Final    Comment:        The GeneXpert MRSA Assay (FDA approved for NASAL specimens only), is one component of a comprehensive MRSA colonization surveillance program. It is not intended to diagnose MRSA infection nor to guide  or monitor treatment for MRSA infections.   Culture, respiratory (NON-Expectorated)     Status: None (Preliminary result)   Collection Time: 11/15/15 11:59 AM  Result Value Ref Range Status   Specimen Description TRACHEAL ASPIRATE  Final   Special Requests NONE  Final   Gram Stain   Final    FEW WBC PRESENT, PREDOMINANTLY PMN RARE GRAM POSITIVE COCCI IN PAIRS RARE GRAM NEGATIVE RODS FEW SQUAMOUS EPITHELIAL CELLS PRESENT Performed at [facility]    Culture PENDING  Incomplete   Report Status PENDING  Incomplete    Assessment: 75 y/o F with a h/o asthma, breast CA, UTI, HTN, CHF admitted with AECOPD and PNA requiring mechanical ventilation with sedation and vasopressors.   Plan:  1. Will continue senna-docusate via tube bid and plan to increase dosing if no BM by 11/4.   2. Continue ceftriaxone 1 g iv q 24 hours with azithromycin.   Ulice Dash D 11/16/2015,11:28 AM

## 2015-11-16 NOTE — Progress Notes (Signed)
PULMONARY / CRITICAL CARE MEDICINE   Name: [name] [name] MRN: [id number] DOB: [birth year]    ADMISSION DATE:  11/14/2015 CONSULTATION DATE:  11/15/15  REFERRING MD:  Dr Jannifer Franklin  CHIEF COMPLAINT: Acute Shortness of breath  Patient Profile [name] [name] is a 75 yo female with medical history significant for Asthma, Breast Cancer(2009), CHF, COPD, Fibromyalgia, hypertension and  tobacco abuse.  Patient presented to ED via EMS with altered mental status and shortness of breath.  Apparently she fell and became increasingly confused.  Patient is on home -O2 but most of the times she is non compliant.  Her CT chest was concering for PNA.  Patient apparently became more hypoxic and was placed on BiPAP then progressively worsened and was intubated.  PCCM team was consulted for further recommendation.   SIGNIFICANT EVENTS: 11/1  Patient admited to [name] with acute respiratory failure due to PNA, requiring BiPAP 11/2 Intubated and mechanically ventillated 11/3 remains intubated fio2 55%  SUBJECTIVE:  Patient remains lightly sedated, intubated and getting mechanically ventilated. Was afebrile overnight.  Is requiring very low dose of pressors to maintain hemodynamic status, overall making some significant improvement. Remains critically ill, fio2 at 55%  VITAL SIGNS: BP (!) 103/46   Pulse 76   Temp 99 F (37.2 C) (Oral)   Resp 15   Ht '5\' 5"'$  (1.651 m) Comment: estimated  Wt 112 lb 3.4 oz (50.9 kg)   SpO2 97%   BMI 18.67 kg/m   HEMODYNAMICS:    VENTILATOR SETTINGS: Vent Mode: PRVC FiO2 (%):  [40 %-60 %] 50 % Set Rate:  [15 bmp] 15 bmp Vt Set:  [500 mL] 500 mL PEEP:  [5 cmH20] 5 cmH20 Plateau Pressure:  [18 cmH20-19 cmH20] 19 cmH20  INTAKE / OUTPUT: I/O last 3 completed shifts: In: 7267.6 [I.V.:2625.6; NG/GT:508.7; IV Piggyback:4133.3] Out: 2245 [Urine:2245]  PHYSICAL EXAMINATION: General:  Elderly white female, intubated and mechanically ventillated Neuro: opens eyes to voice,  follows simple commands HEENT:  Atraumatic, normocephalic, no discharge, no JVD appreciated Cardiovascular:  S1, S2, regular rate and rhythm, no MRG noted Lungs: diminished bibasilar, no wheezes,cracckles, rhonchi noted Abdomen: soft and non Tender, no guarding noted Musculoskeletal:  No inflammation/deformity noted Skin:  Grossly intact Neuro: GCS<8T  LABS:  BMET  Recent Labs Lab 11/14/15 2131 11/15/15 0418 11/16/15 0435  NA 127* 128* 131*  K 3.5 3.3* 4.0  CL 88* 95* 99*  CO2 '27 26 24  '$ BUN '13 13 17  '$ CREATININE 2.23* 2.19* 1.18*  GLUCOSE 115* 98 208*    Electrolytes  Recent Labs Lab 11/14/15 2131  11/15/15 0418 11/15/15 1421 11/15/15 1726 11/16/15 0435  CALCIUM 9.6  --  8.2*  --   --  9.0  MG  --   < > 1.8 1.7 1.7 1.7  PHOS  --   < > 4.1 3.6 3.2 2.3*  < > = values in this interval not displayed.  CBC  Recent Labs Lab 11/14/15 2131 11/15/15 0418 11/16/15 0435  WBC 13.0* 11.6* 12.2*  HGB 13.9 11.8* 11.5*  HCT 40.2 35.2 34.3*  PLT 229 207 233    Coag's No results for input(s): APTT, INR in the last 168 hours.  Sepsis Markers  Recent Labs Lab 11/14/15 2131 11/16/15 0435  LATICACIDVEN 1.3  --   PROCALCITON 0.13 0.25    ABG  Recent Labs Lab 11/15/15 0615 11/15/15 1100 11/16/15 0451  PHART 7.19* 7.24* 7.32*  PCO2ART 66* 55* 47  PO2ART 47* 60* 105    Liver  Enzymes  Recent Labs Lab 11/14/15 2131  AST 18  ALT 9*  ALKPHOS 98  BILITOT 0.4  ALBUMIN 3.9    Cardiac Enzymes No results for input(s): TROPONINI, PROBNP in the last 168 hours.  Glucose  Recent Labs Lab 11/15/15 0237 11/15/15 1512 11/15/15 1941 11/15/15 2346 11/16/15 0406  GLUCAP 145* 166* 198* 182* 181*    Imaging Dg Chest 1 View  Result Date: 11/15/2015 CLINICAL DATA:  Central line placement, asthma, LEFT breast cancer, its CHF, COPD, hypertension, smoker EXAM: CHEST 1 VIEW COMPARISON:  Portable exam 0732 hours compared to 0640 hours FINDINGS: Tip of  endotracheal tube projects 3.6 cm above carina. Nasogastric tube coiled in proximal stomach. RIGHT jugular central venous catheter tip ribs projects over cavoatrial junction. Stable heart size, mediastinal contours and pulmonary vascularity. Slight chronic accentuation of interstitial markings, stable. No definite acute infiltrate, pleural effusion or pneumothorax. Gaseous distention of stomach. Osseous demineralization. Atherosclerotic calcification aorta. IMPRESSION: No pneumothorax following RIGHT jugular line placement. Remainder of exam unchanged. Aortic atherosclerosis. Electronically Signed   By: [place] Dana M.D.   On: 11/15/2015 07:57   Dg Chest Port 1 View  Result Date: 11/16/2015 CLINICAL DATA:  Respiratory failure. EXAM: PORTABLE CHEST 1 VIEW COMPARISON:  11/15/2015. FINDINGS: Endotracheal tube, NG tube, right IJ line in stable position. Heart size stable. Left base atelectasis. No prominent pleural effusion or pneumothorax . IMPRESSION: 1. Lines and tubes in stable position. 2.  Left base atelectasis. Electronically Signed   By: Marcello Moores  Register   On: 11/16/2015 07:12     STUDIES:  11/14/15 CT head>> Negative for any intracranial abnormality 11/14/15 CT chest>>Mild to moderate diffuse emphysematous changes bilaterally.Interim finding of scattered areas of mild ground-glass densitywithin the bilateral upper lobes suggesting foci of pneumonitis.Soft tissue thickening around the central airways with centralairspace disease, suggests central airways inflammation and possible pneumonia. No effusion.  CULTURES: 11/1 BC>> 11/1SC>>  11/1 UC>>  ANTIBIOTICS: 11/2 Ceftriaxone>> 11/2 Azithromycin>> LINES/TUBES: NONE     75 yo white female admitted to [name] for acute resp failure from COPD exacerbation and pneumonia with CHF exacerbation  ASSESSMENT / PLAN:  PULMONARY A: Acute Hypoxic Respiratory failure Community Acquired PNA Copd  Tobacco abuse P:   Full vent support, wean as  tolerated Routine ABG Continue Azithromycin/ceftriaxone Continue duoneb/dulera CXR in am -plan for SAT/SBT 11/4 Will need to get down to fio2 35%  CARDIOVASCULAR A:  Septic scock Congestive Heart Failure Hx of hypertension P:  Continuous telemetry Keep MAP goals>65 Neo syneohrine gtt  RENAL A:   Acute kidney injury related to septic schock Hyponatremia P:   N/s '@50'$  ml/hour,( low EF- 35% Aug-2017) Replace electrolytes per [name] protocol   GASTROINTESTINAL A:   No active issues P:   Npo for now High risk for intubation  HEMATOLOGIC A:   No active issues P:  transfuse if Hgb<7 Heparin for DVT prophylaxis  INFECTIOUS A:   Community Acquired PNA P:   Monitor fever curve Antibiotics as above Follow cultures.  ENDOCRINE A:   No active issues  P:   bs checks with BMP  NEUROLOGIC - intubated and sedated - minimal sedation to achieve a RASS goal: -1   I have personally obtained a history, examined the patient, evaluated Pertinent laboratory and RadioGraphic/imaging results, and  formulated the assessment and plan   The Patient requires high complexity decision making for assessment and support, frequent evaluation and titration of therapies, application of advanced monitoring technologies and extensive interpretation of multiple databases. Critical  Care Time devoted to patient care services described in this note is 35 minutes.   Overall, patient is critically ill, prognosis is guarded.  Patient with Multiorgan failure and at high risk for cardiac arrest and death.    Corrin Parker, M.D.  [facility]  Medical Director [place] Director [facility] Cardio-Pulmonary Department

## 2015-11-17 LAB — GLUCOSE, CAPILLARY
GLUCOSE-CAPILLARY: 172 mg/dL — AB (ref 65–99)
GLUCOSE-CAPILLARY: 128 mg/dL — AB (ref 65–99)
GLUCOSE-CAPILLARY: 148 mg/dL — AB (ref 65–99)
GLUCOSE-CAPILLARY: 111 mg/dL — AB (ref 65–99)
GLUCOSE-CAPILLARY: 97 mg/dL (ref 65–99)

## 2015-11-17 LAB — CULTURE, RESPIRATORY W GRAM STAIN: Culture: NORMAL

## 2015-11-17 LAB — PROCALCITONIN: Procalcitonin: 0.17 ng/mL

## 2015-11-17 MED ORDER — SENNOSIDES-DOCUSATE SODIUM 8.6-50 MG PO TABS
2.0000 | ORAL_TABLET | Freq: Two times a day (BID) | ORAL | Status: DC
  Administered 2015-11-17 – 2015-11-29 (×21): 2 via ORAL
  Filled 2015-11-17 (×21): qty 2

## 2015-11-17 MED ORDER — SENNOSIDES-DOCUSATE SODIUM 8.6-50 MG PO TABS: 1.0000 | ORAL_TABLET | Freq: Two times a day (BID) | ORAL | Status: DC

## 2015-11-17 MED ORDER — CARVEDILOL 6.25 MG PO TABS
6.2500 mg | ORAL_TABLET | Freq: Two times a day (BID) | ORAL | Status: DC
  Administered 2015-11-17 – 2015-11-21 (×9): 6.25 mg via ORAL
  Filled 2015-11-17 (×9): qty 1

## 2015-11-17 MED ORDER — FAMOTIDINE 20 MG PO TABS
20.0000 mg | ORAL_TABLET | Freq: Every day | ORAL | Status: DC
  Administered 2015-11-18 – 2015-11-29 (×10): 20 mg via ORAL
  Filled 2015-11-17 (×11): qty 1

## 2015-11-17 MED ORDER — HALOPERIDOL LACTATE 5 MG/ML IJ SOLN
2.5000 mg | Freq: Four times a day (QID) | INTRAMUSCULAR | Status: DC | PRN
  Administered 2015-11-17 – 2015-11-18 (×2): 2.5 mg via INTRAVENOUS
  Filled 2015-11-17: qty 1

## 2015-11-17 MED ORDER — HALOPERIDOL LACTATE 5 MG/ML IJ SOLN
INTRAMUSCULAR | Status: AC
  Administered 2015-11-17: 2.5 mg via INTRAVENOUS
  Filled 2015-11-17: qty 1

## 2015-11-17 MED ORDER — CLONAZEPAM 1 MG PO TABS
1.0000 mg | ORAL_TABLET | Freq: Two times a day (BID) | ORAL | Status: DC
  Administered 2015-11-17 – 2015-11-29 (×22): 1 mg via ORAL
  Filled 2015-11-17 (×23): qty 1

## 2015-11-17 MED ORDER — ZIPRASIDONE MESYLATE 20 MG IM SOLR
10.0000 mg | Freq: Four times a day (QID) | INTRAMUSCULAR | Status: DC | PRN
Start: 2015-11-17 — End: 2015-11-19
  Filled 2015-11-17: qty 20

## 2015-11-17 MED ORDER — INSULIN ASPART 100 UNIT/ML ~~LOC~~ SOLN
0.0000 [IU] | Freq: Three times a day (TID) | SUBCUTANEOUS | Status: DC
  Administered 2015-11-17: 1 [IU] via SUBCUTANEOUS
  Administered 2015-11-19: 3 [IU] via SUBCUTANEOUS
  Filled 2015-11-17: qty 3
  Filled 2015-11-17: qty 1

## 2015-11-17 MED ORDER — LORAZEPAM 2 MG/ML IJ SOLN
0.5000 mg | Freq: Four times a day (QID) | INTRAMUSCULAR | Status: DC | PRN
  Administered 2015-11-17 – 2015-11-25 (×4): 0.5 mg via INTRAVENOUS
  Filled 2015-11-17 (×6): qty 1

## 2015-11-17 NOTE — Progress Notes (Signed)
[place] NOTE  Pharmacy Consult for Constipation Prevention/Ceftriaxone Dosing Indication: Sedation with Fentanyl/AECOPD and PNA   Allergies  Allergen Reactions  . Contrast Media [Iodinated Diagnostic Agents] Other (See Comments)    Pt was sent to the ED following contrast media injection at [place]. Unknown reason. She has been premedicated since without complications. Pt to be premedicated prior to contrast media injections  . Ioxaglate Other (See Comments)    Pt was sent to the ED following contrast media injection at [place]. Unknown reason. She has been premedicated since without complications. Pt to be premedicated prior to contrast media injections  . Sulfa Antibiotics     Other reaction(s): Other (See Comments)  . Tetracycline Hives and Other (See Comments)  . White Petrolatum Other (See Comments)  . Amoxicillin-Pot Clavulanate Rash    Blisters in mouth Other reaction(s): Unknown Blisters in mouth Blisters in mouth  . Tape Rash    Patient Measurements: Height: '5\' 5"'$  (165.1 cm) (estimated) Weight: 118 lb 6.2 oz (53.7 kg) IBW/kg (Calculated) : 57  Vital Signs: Temp: 99 F (37.2 C) (11/04 0400) Temp Source: Oral (11/04 0400) BP: 152/76 (11/04 1010) Pulse Rate: 119 (11/04 1010) Intake/Output from previous day: 11/03 0701 - 11/04 0700 In: 3518.6 [I.V.:2168.6; NG/GT:1000; IV Piggyback:350] Out: 6295 [Urine:1470] Intake/Output from this shift: No intake/output data recorded. Vent settings for last 24 hours: Vent Mode: PSV FiO2 (%):  [35 %-45 %] 35 % Set Rate:  [15 bmp] 15 bmp Vt Set:  [500 mL] 500 mL PEEP:  [5 cmH20] 5 cmH20 Pressure Support:  [5 cmH20] 5 cmH20 Plateau Pressure:  [19 cmH20] 19 cmH20  Labs:  Recent Labs  11/14/15 2131 11/15/15 0418  11/15/15 1726 11/16/15 0435 11/16/15 1712  WBC 13.0* 11.6*  --   --  12.2*  --   HGB 13.9 11.8*  --   --  11.5*  --   HCT 40.2 35.2  --   --  34.3*  --   PLT 229  207  --   --  233  --   CREATININE 2.23* 2.19*  --   --  1.18*  --   MG  --  1.8  < > 1.7 1.7 1.7  PHOS  --  4.1  < > 3.2 2.3* 2.3*  ALBUMIN 3.9  --   --   --   --   --   PROT 8.1  --   --   --   --   --   AST 18  --   --   --   --   --   ALT 9*  --   --   --   --   --   ALKPHOS 98  --   --   --   --   --   BILITOT 0.4  --   --   --   --   --   < > = values in this interval not displayed. Estimated Creatinine Clearance: 36 mL/min (by C-G formula based on SCr of 1.18 mg/dL (H)).   Recent Labs  11/16/15 2355 11/17/15 0351 11/17/15 0725  GLUCAP 136* 172* 128*    Microbiology: Recent Results (from the past 720 hour(s))  Blood Culture (routine x 2)     Status: None (Preliminary result)   Collection Time: 11/14/15  9:31 PM  Result Value Ref Range Status   Specimen Description BLOOD LT FOREARM  Final   Special Requests BOTTLES DRAWN AEROBIC  AND ANAEROBIC 6CC  Final   Culture NO GROWTH 3 DAYS  Final   Report Status PENDING  Incomplete  Blood Culture (routine x 2)     Status: None (Preliminary result)   Collection Time: 11/14/15  9:31 PM  Result Value Ref Range Status   Specimen Description BLOOD LEFT AC  Final   Special Requests BOTTLES DRAWN AEROBIC AND ANAEROBIC ANA5CC AER [place]  Final   Culture NO GROWTH 3 DAYS  Final   Report Status PENDING  Incomplete  Urine culture     Status: None   Collection Time: 11/14/15  9:31 PM  Result Value Ref Range Status   Specimen Description URINE, RANDOM  Final   Special Requests NONE  Final   Culture NO GROWTH Performed at [facility]   Final   Report Status 11/16/2015 FINAL  Final  MRSA PCR Screening     Status: None   Collection Time: 11/15/15  2:36 AM  Result Value Ref Range Status   MRSA by PCR NEGATIVE NEGATIVE Final    Comment:        The GeneXpert MRSA Assay (FDA approved for NASAL specimens only), is one component of a comprehensive MRSA colonization surveillance program. It is not intended to diagnose  MRSA infection nor to guide or monitor treatment for MRSA infections.   Culture, respiratory (NON-Expectorated)     Status: None   Collection Time: 11/15/15 11:59 AM  Result Value Ref Range Status   Specimen Description TRACHEAL ASPIRATE  Final   Special Requests NONE  Final   Gram Stain   Final    FEW WBC PRESENT, PREDOMINANTLY PMN RARE GRAM POSITIVE COCCI IN PAIRS RARE GRAM NEGATIVE RODS FEW SQUAMOUS EPITHELIAL CELLS PRESENT    Culture   Final    Consistent with normal respiratory flora. Performed at [facility]    Report Status 11/17/2015 FINAL  Final    Assessment: 75 y/o F with a h/o asthma, breast CA, UTI, HTN, CHF admitted with AECOPD and PNA requiring mechanical ventilation with sedation and vasopressors.   Plan:  1. No BM yet.  Will increase senna-docusate to 2 tablets PO BID.    2. Continue ceftriaxone 1 g iv q 24 hours with  azithromycin.   [place] Clinical Pharmacist 11/17/2015,11:37 AM

## 2015-11-17 NOTE — Evaluation (Signed)
Clinical/Bedside Swallow Evaluation Patient Details  Name: [name] [name] MRN: [id number] Date of Birth: [birth year]  Today's Date: 11/17/2015 Time: SLP Start Time (ACUTE ONLY): 1024 SLP Stop Time (ACUTE ONLY): 1058 SLP Time Calculation (min) (ACUTE ONLY): 34 min  Past Medical History:  Past Medical History:  Diagnosis Date  . Asthma   . Breast cancer ([place]) 2009   left  . Cancer ([place])   . CHF (congestive heart failure) ([place])   . Chronic UTI   . COPD (chronic obstructive pulmonary disease) ([place])   . Dizziness   . Fibromyalgia   . Hypertension   . Neuropathy ([place])   . Personal history of tobacco use, presenting hazards to health 05/17/2015  . Polyp, larynx   . RA (rheumatoid arthritis) ([place])   . Sinus infection    recent  . Stumbling gait    to the left  . Supplemental oxygen dependent    2.5l   Past Surgical History:  Past Surgical History:  Procedure Laterality Date  . ABDOMINAL HYSTERECTOMY    . BREAST LUMPECTOMY Left 2009   chemo and radiation  . CYST EXCISION Left 02/27/2015   Procedure: CYST REMOVAL;  Surgeon: Hessie Knows, MD;  Location: [facility] ORS;  Service: Orthopedics;  Laterality: Left;  . EYE MUSCLE SURGERY Right    13 surgeries  . THUMB ARTHROSCOPY Left    HPI:      Assessment / Plan / Recommendation Clinical Impression  Pt presents with a moderate to severe oral pharyngeal dysphagia characterized by decreased oral transit adn mastication ability. pt unable to form complete bolus and when given solids struggled to [facility] and form cohesive bolus for a-p transit. pt with noted wet vocal quality, immediate and delayed cough, and decrease in O2 sats with thin liquids. pt WFL for pure and honey, however pt should be closely monitored during meals.    Aspiration Risk  Moderate aspiration risk;Severe aspiration risk    Diet Recommendation Dysphagia 1 (Puree);Honey-thick liquid   Liquid Administration via: Cup;Spoon Medication Administration: Crushed with  puree Supervision: Staff to assist with self feeding Compensations: Slow rate;Small sips/bites;Follow solids with liquid Postural Changes: Seated upright at 90 degrees    Other  Recommendations Oral Care Recommendations: Oral care BID   Follow up Recommendations        Frequency and Duration min 3x week  1 week       Prognosis Prognosis for Safe Diet Advancement: Fair Barriers to Reach Goals: Severity of deficits      Swallow Study   General Date of Onset: 11/15/15 Type of Study: Bedside Swallow Evaluation Diet Prior to this Study: NPO Temperature Spikes Noted: No Respiratory Status: Room air History of Recent Intubation: Yes Length of Intubations (days): 2 days Date extubated: 11/17/15 Behavior/Cognition: Cooperative;Confused;Agitated Oral Cavity Assessment: Within Functional Limits Oral Care Completed by SLP: No Oral Cavity - Dentition: Poor condition Vision: Impaired for self-feeding Self-Feeding Abilities: Needs assist Patient Positioning: Upright in bed Baseline Vocal Quality: Breathy;Hoarse Volitional Cough: Weak Volitional Swallow: Unable to elicit    Oral/Motor/Sensory Function Overall Oral Motor/Sensory Function: Within functional limits   Ice Chips Ice chips: Not tested   Thin Liquid Thin Liquid: Impaired Oral Phase Impairments: Reduced labial seal Oral Phase Functional Implications: Right anterior spillage;Prolonged oral transit;Oral holding Pharyngeal  Phase Impairments: Suspected delayed Swallow;Throat Clearing - Delayed;Throat Clearing - Immediate;Cough - Immediate;Cough - Delayed;Wet Vocal Quality;Change in Vital Signs    Nectar Thick Nectar Thick Liquid: Not tested   Honey Thick Honey Thick Liquid:  Within functional limits Presentation: Cup;Spoon   Puree Puree: Within functional limits Presentation: Spoon   Solid   GO   Solid: Not tested        [place] Sauber 11/17/2015,11:35 AM

## 2015-11-17 NOTE — Progress Notes (Signed)
Patient extubated earlier to a 4liter nasal cannula.  SBT initiated per NP. VT [phone] mv 6.9 rr 10-24 end tidal 39-45 saturation 97 HR 123.  Patient able to answer yes with head shake at times.  Patient very anxious at the end of trial after sedation was completely off however tolerated procedure well and began speaking shortly afterwards.

## 2015-11-17 NOTE — Progress Notes (Signed)
   Patient had spontaneous breathing trial and followed simple commands.  Patient  was extubated at 05:40 AM.  Maintaining saturation well.  Will continue to monitor closely for any distress.   [facility]

## 2015-11-17 NOTE — Progress Notes (Signed)
Patient transferred to floor via wheelchair accompanied by [facility] RN and Marcene Brawn NT. Report given to [facility]

## 2015-11-17 NOTE — Progress Notes (Signed)
PULMONARY / CRITICAL CARE MEDICINE   Name: [name] [name] MRN: [id number] DOB: Mar 05, 1940    ADMISSION DATE:  11/14/2015 CONSULTATION DATE:  11/15/15  REFERRING MD:  Dr Jannifer Franklin  CHIEF COMPLAINT: Acute Shortness of breath  Patient Profile [name] [name] is a59 yo female with medical history significant for Asthma, Breast Cancer(2009), CHF, COPD, Fibromyalgia, hypertension and  tobacco abuse.  Patient presented to ED via EMS with altered mental status and shortness of breath.  Apparently she fell and became increasingly confused.  Patient is on home -O2 but most of the times she is non compliant.  Her CT chest was concering for PNA.  Patient apparently became more hypoxic and was placed on BiPAP.  PCCM team was consulted for further recommendation.   SIGNIFICANT EVENTS: 11/1  Patient admited to [name] with acute respiratory failure due to PNA, requiring BiPAP 11/2 Intubated and mechanically ventillated  SUBJECTIVE:  Patient remains lightly sedated, intubated and getting mechanically ventilated. Was afebrile overnight.  Is requiring very low dose of pressors to maintain hemodynamic status, overall making some significant improvement.   VITAL SIGNS: BP (!) 112/48   Pulse 75   Temp 98.1 F (36.7 C) (Axillary)   Resp 15   Ht '5\' 5"'$  (1.651 m) Comment: estimated  Wt 50.9 kg (112 lb 3.4 oz)   SpO2 96%   BMI 18.67 kg/m   HEMODYNAMICS:    VENTILATOR SETTINGS: Vent Mode: PRVC FiO2 (%):  [35 %-55 %] 35 % Set Rate:  [15 bmp] 15 bmp Vt Set:  [500 mL] 500 mL PEEP:  [5 cmH20] 5 cmH20 Plateau Pressure:  [19 cmH20] 19 cmH20  INTAKE / OUTPUT: I/O last 3 completed shifts: In: 4402.8 [I.V.:2964.1; NG/GT:988.7; IV Piggyback:450] Out: 2820 [Urine:2820]  PHYSICAL EXAMINATION: General:  Elderly white female, intubated and mechanically ventillated Neuro: opens eyes to voice, follows simple commands HEENT:  Atraumatic, normocephalic, no discharge, no JVD appreciated Cardiovascular:  S1, S2,  regular rate and rhythm, no MRG noted Lungs: diminished bibasilar, no wheezes,cracckles, rhonchi noted Abdomen: soft and non Tender, no guarding noted Musculoskeletal:  No inflammation/deformity noted Skin:  Grossly intact  LABS:  BMET  Recent Labs Lab 11/14/15 2131 11/15/15 0418 11/16/15 0435  NA 127* 128* 131*  K 3.5 3.3* 4.0  CL 88* 95* 99*  CO2 '27 26 24  '$ BUN '13 13 17  '$ CREATININE 2.23* 2.19* 1.18*  GLUCOSE 115* 98 208*    Electrolytes  Recent Labs Lab 11/14/15 2131 11/15/15 0418  11/15/15 1726 11/16/15 0435 11/16/15 1712  CALCIUM 9.6 8.2*  --   --  9.0  --   MG  --  1.8  < > 1.7 1.7 1.7  PHOS  --  4.1  < > 3.2 2.3* 2.3*  < > = values in this interval not displayed.  CBC  Recent Labs Lab 11/14/15 2131 11/15/15 0418 11/16/15 0435  WBC 13.0* 11.6* 12.2*  HGB 13.9 11.8* 11.5*  HCT 40.2 35.2 34.3*  PLT 229 207 233    Coag's No results for input(s): APTT, INR in the last 168 hours.  Sepsis Markers  Recent Labs Lab 11/14/15 2131 11/16/15 0435  LATICACIDVEN 1.3  --   PROCALCITON 0.13 0.25    ABG  Recent Labs Lab 11/15/15 0615 11/15/15 1100 11/16/15 0451  PHART 7.19* 7.24* 7.32*  PCO2ART 66* 55* 47  PO2ART 47* 60* 105    Liver Enzymes  Recent Labs Lab 11/14/15 2131  AST 18  ALT 9*  ALKPHOS 98  BILITOT 0.4  ALBUMIN 3.9    Cardiac Enzymes No results for input(s): TROPONINI, PROBNP in the last 168 hours.  Glucose  Recent Labs Lab 11/16/15 0406 11/16/15 0741 11/16/15 1130 11/16/15 1625 11/16/15 1930 11/16/15 2355  GLUCAP 181* 206* 174* 119* 177* 136*    Imaging Dg Chest Port 1 View  Result Date: 11/16/2015 CLINICAL DATA:  Respiratory failure. EXAM: PORTABLE CHEST 1 VIEW COMPARISON:  11/15/2015. FINDINGS: Endotracheal tube, NG tube, right IJ line in stable position. Heart size stable. Left base atelectasis. No prominent pleural effusion or pneumothorax . IMPRESSION: 1. Lines and tubes in stable position. 2.  Left base  atelectasis. Electronically Signed   By: Marcello Moores  Register   On: 11/16/2015 07:12     STUDIES:  11/14/15 CT head>> Negative for any intracranial abnormality 11/14/15 CT chest>>Mild to moderate diffuse emphysematous changes bilaterally.Interim finding of scattered areas of mild ground-glass densitywithin the bilateral upper lobes suggesting foci of pneumonitis.Soft tissue thickening around the central airways with centralairspace disease, suggests central airways inflammation and possible pneumonia. No effusion. CULTURES: 11/1 BC>> 11/1SC>>  11/1 UC>>ngtd  ANTIBIOTICS: 11/2 Ceftriaxone>> 11/2 Azithromycin>> LINES/TUBES: NONE    ASSESSMENT / PLAN:  PULMONARY A: Acute Hypoxic Respiratory failure Community Acquired PNA Copd  Tobacco abuse P:   Full vent support, wean as tolerated SBT trials in am Routine ABG Continue Azithromycin/ceftriaxone Continue duoneb/dulera CXR in am  CARDIOVASCULAR A:  Septic scock Congestive Heart Failure Hx of hypertension P:  Continuous telemetry Keep MAP goals>65 Neo syneohrine gtt Hold lisinopril /coreg  RENAL A:   Acute kidney injury related to septic schock Hyponatremia P:   N/s '@50'$  ml/hour,( low EF- 35% Aug-2017) Replace electrolytes per [name] protocol   GASTROINTESTINAL A:   No active issues P:   Npo for now High risk for intubation  HEMATOLOGIC A:   No active issues P:  transfuse if Hgb<7 Heparin for DVT prophylaxis  INFECTIOUS A:   Community Acquired PNA P:   Monitor fever curve Antibiotics as above Follow cultures.  ENDOCRINE A:   No active issues  P:    BS checks with BMP  NEUROLOGIC A:   Acute encephalopathy related to sepsis P:   Hold sedating medication Reorient freequently     Ciela Mahajan,AG-ACNP Pulmonary and [place]   11/17/2015, 3:16 AM

## 2015-11-17 NOTE — Progress Notes (Signed)
RN made Dr. Tressia Miners aware that patient is agitated and aggressive trying to bite and hit staff with patient care.  Patient was given ativan 0.5 mg once within the hour and not effective.  MD stated she would order haldol and sitter order.

## 2015-11-17 NOTE — Progress Notes (Signed)
Patient extubated with Np, Respiratory therapy, and charge nurse at bedside. Sedation was weaned back to around half dosages and cut off. Levophed was cut off, patient maintained blood pressure well with Levophed off. Patient nodding head appropriately during wean but unable to follow commands to full ability.  Patient was extubated, placed on 4 L nasal cannula. Voice is clear, slightly hoarse, patient is disorientated to person, place, time and situation. Talking with words that do not make sense. Vital signs are stable, at bedside with patient providing emotional support.  Will continue to monitor the patient and re-orient.

## 2015-11-17 NOTE — Plan of Care (Signed)
Report received from [employer], RN in [name]. Received patient with sitter at bedside in stable condition.

## 2015-11-18 LAB — GLUCOSE, CAPILLARY
GLUCOSE-CAPILLARY: 115 mg/dL — AB (ref 65–99)
GLUCOSE-CAPILLARY: 103 mg/dL — AB (ref 65–99)
GLUCOSE-CAPILLARY: 130 mg/dL — AB (ref 65–99)
Glucose-Capillary: 96 mg/dL (ref 65–99)

## 2015-11-18 MED ORDER — SODIUM CHLORIDE 0.9% FLUSH
3.0000 mL | Freq: Two times a day (BID) | INTRAVENOUS | Status: DC
  Administered 2015-11-18 – 2015-11-29 (×21): 3 mL via INTRAVENOUS

## 2015-11-18 MED ORDER — RISPERIDONE 1 MG PO TBDP
1.0000 mg | ORAL_TABLET | Freq: Two times a day (BID) | ORAL | Status: DC
  Administered 2015-11-18 – 2015-11-22 (×8): 1 mg via ORAL
  Filled 2015-11-18 (×10): qty 1

## 2015-11-18 MED ORDER — SODIUM CHLORIDE 0.9% FLUSH
3.0000 mL | INTRAVENOUS | Status: DC | PRN
  Administered 2015-11-18 (×2): 3 mL via INTRAVENOUS
  Filled 2015-11-18 (×2): qty 3

## 2015-11-18 MED ORDER — HYDROCOD POLST-CPM POLST ER 10-8 MG/5ML PO SUER
5.0000 mL | Freq: Two times a day (BID) | ORAL | Status: DC
  Administered 2015-11-18 – 2015-11-29 (×20): 5 mL via ORAL
  Filled 2015-11-18 (×20): qty 5

## 2015-11-18 NOTE — Progress Notes (Addendum)
[place] at [place] NAME: [name] [name]    MR#:  [id number]  DATE OF BIRTH:  [birth year]  SUBJECTIVE:  CHIEF COMPLAINT:   Chief Complaint  Patient presents with  . Shortness of Breath  . Altered Mental Status   - admitted to [name] for acute resp failure secondary to COPD exacerbation and pneumonia - extubated yesterday, has been very delirious since extubation - has a sitter in the room.  REVIEW OF SYSTEMS:  Review of Systems  Unable to perform ROS: Mental acuity    DRUG ALLERGIES:   Allergies  Allergen Reactions  . Contrast Media [Iodinated Diagnostic Agents] Other (See Comments)    Pt was sent to the ED following contrast media injection at [place]. Unknown reason. She has been premedicated since without complications. Pt to be premedicated prior to contrast media injections  . Ioxaglate Other (See Comments)    Pt was sent to the ED following contrast media injection at [place]. Unknown reason. She has been premedicated since without complications. Pt to be premedicated prior to contrast media injections  . Sulfa Antibiotics     Other reaction(s): Other (See Comments)  . Tetracycline Hives and Other (See Comments)  . White Petrolatum Other (See Comments)  . Amoxicillin-Pot Clavulanate Rash    Blisters in mouth Other reaction(s): Unknown Blisters in mouth Blisters in mouth  . Tape Rash    VITALS:  Blood pressure 135/77, pulse (!) 130, temperature 97.5 F (36.4 C), temperature source Oral, resp. rate 18, height '5\' 5"'$  (1.651 m), weight 53.3 kg (117 lb 6.4 oz), SpO2 94 %.  PHYSICAL EXAMINATION:  Physical Exam  GENERAL:  75 y.o.-year-old patient lying in the bed with no acute distress. Very confused, tryng to pull things off. EYES: Pupils equal, round, reactive to light and accommodation. No scleral icterus. Extraocular muscles intact.  HEENT: Head atraumatic, normocephalic. Oropharynx and nasopharynx  clear.  NECK:  Supple, no jugular venous distention. No thyroid enlargement, no tenderness.  LUNGS: Normal breath sounds bilaterally, scattered wheezing, no rales,rhonchi or crepitation. No use of accessory muscles of respiration. Diminished breath sounds CARDIOVASCULAR: S1, S2 normal. No murmurs, rubs, or gallops.  ABDOMEN: Soft, nontender, nondistended. Bowel sounds present. No organomegaly or mass.  EXTREMITIES: No pedal edema, cyanosis, or clubbing.  NEUROLOGIC: Cranial nerves II through XII are intact. Muscle strength 5/5 in all extremities. Sensation intact. Gait not checked. Not following commands, moving all extremities fine. PSYCHIATRIC: The patient is alert but not oriented  SKIN: No obvious rash, lesion, or ulcer.    LABORATORY PANEL:   CBC  Recent Labs Lab 11/16/15 0435  WBC 12.2*  HGB 11.5*  HCT 34.3*  PLT 233   ------------------------------------------------------------------------------------------------------------------  Chemistries   Recent Labs Lab 11/14/15 2131  11/16/15 0435 11/16/15 1712  NA 127*  < > 131*  --   K 3.5  < > 4.0  --   CL 88*  < > 99*  --   CO2 27  < > 24  --   GLUCOSE 115*  < > 208*  --   BUN 13  < > 17  --   CREATININE 2.23*  < > 1.18*  --   CALCIUM 9.6  < > 9.0  --   MG  --   < > 1.7 1.7  AST 18  --   --   --   ALT 9*  --   --   --   ALKPHOS 98  --   --   --  BILITOT 0.4  --   --   --   < > = values in this interval not displayed. ------------------------------------------------------------------------------------------------------------------  Cardiac Enzymes No results for input(s): TROPONINI in the last 168 hours. ------------------------------------------------------------------------------------------------------------------  RADIOLOGY:  No results found.  EKG:   Orders placed or performed during the hospital encounter of 11/14/15  . ED EKG 12-Lead  . ED EKG 12-Lead  . EKG 12-Lead  . EKG 12-Lead     ASSESSMENT AND PLAN:   [name] [name] 52-year-old female with past medical history significant for COPD/asthma on home oxygen noncompliant, fibromyalgia, hypertension, neuropathy presented to hospital secondary to acute hypoxic respiratory failure  #1 acute hypoxic respiratory failure-secondary to COPD exacerbation and pneumonia on admission. -Admitted to [name] for BiPAP, however failed and was intubated. She got extubated and currently stable on nasal cannula oxygen. -Continue Rocephin and azithromycin for pneumonia. Repeat chest x-ray showing only atelectasis. -Continue steroids and inhaler/nebulizer for her COPD.  #2 acute delirium-after extubation. Could be the sedatives in [name]. -Added risperidone. Also on Klonopin. Continue Haldol as needed. No focal neuro deficits at this time. -Has a Air cabin crew in the room.  #3 sepsis-secondary to pneumonia on admission. Resolved at this time.  #4 fibromyalgia-pain medications are on hold  #5 DVT prophylaxis-on subcutaneous heparin   Physical therapy once more alert Son in the room and updated  All the records are reviewed and case discussed with Care Management/Social Workerr. Management plans discussed with the patient, family and they are in agreement.  CODE STATUS: Full code  TOTAL TIME TAKING CARE OF THIS PATIENT: 38 minutes.   POSSIBLE D/C IN 2-3 DAYS, DEPENDING ON CLINICAL CONDITION.   Gladstone Lighter M.D on 11/18/2015 at 12:07 PM  Between 7am to 6pm - Pager - [phone]  After 6pm go to [email or web address] - password [email or web address] [facility]  Office  [phone]  CC: Primary care physician; [name] Harrier, MD

## 2015-11-18 NOTE — Plan of Care (Signed)
Problem: Fluid Volume: Goal: Ability to maintain a balanced intake and output will improve Outcome: Not Progressing Pt only taking sips of thickened liquids. Refuses solid food so far today.   Problem: Nutrition: Goal: Adequate nutrition will be maintained Outcome: Not Progressing Only sipping on juice. No solid food accepted so far today.

## 2015-11-18 NOTE — Care Management Important Message (Signed)
Important Message  Patient Details  Name: [name] [name] MRN: [id number] Date of Birth: 1940/06/28   Medicare Important Message Given:  Yes    Kwasi Joung A, RN 11/18/2015, 11:02 AM

## 2015-11-18 NOTE — Progress Notes (Signed)
Pt alert but confused except to self. Pt requires 1:1 sitter for safety; pt wearing 02 for longer periods but still pulling it off at intervals with replacement done by nursing; restless/impulsive. Tussionex effective for frequent congested, weak cough. Haldol effective for agitation this a.m. X1. Respirdol started with pt currently resting quietly with eyes closed. Family in for visit. Poor nutritional intake at this time. Continued Rocephin and Azithromycin IV. Lung fields with rhonchi and wheezes throughout. Neb treatments and solumedrol IV continued.

## 2015-11-19 ENCOUNTER — Inpatient Hospital Stay: Payer: Commercial Managed Care - HMO

## 2015-11-19 DIAGNOSIS — E871 Hypo-osmolality and hyponatremia: Secondary | ICD-10-CM

## 2015-11-19 DIAGNOSIS — I9589 Other hypotension: Secondary | ICD-10-CM

## 2015-11-19 DIAGNOSIS — R4182 Altered mental status, unspecified: Secondary | ICD-10-CM

## 2015-11-19 DIAGNOSIS — J9601 Acute respiratory failure with hypoxia: Secondary | ICD-10-CM

## 2015-11-19 DIAGNOSIS — J9602 Acute respiratory failure with hypercapnia: Secondary | ICD-10-CM

## 2015-11-19 DIAGNOSIS — R410 Disorientation, unspecified: Secondary | ICD-10-CM

## 2015-11-19 LAB — BLOOD GAS, ARTERIAL
ACID-BASE EXCESS: 5.5 mmol/L — AB (ref 0.0–2.0)
BICARBONATE: 29.9 mmol/L — AB (ref 20.0–28.0)
Delivery systems: POSITIVE
Expiratory PAP: 6
FIO2: 50
INSPIRATORY PAP: 12
O2 SAT: 98.8 %
PATIENT TEMPERATURE: 37
PCO2 ART: 42 mmHg (ref 32.0–48.0)
PO2 ART: 119 mmHg — AB (ref 83.0–108.0)
pH, Arterial: 7.46 — ABNORMAL HIGH (ref 7.350–7.450)

## 2015-11-19 LAB — TROPONIN I
TROPONIN I: 0.25 ng/mL — AB (ref <0.03)
TROPONIN I: 0.45 ng/mL — AB (ref <0.03)

## 2015-11-19 LAB — GLUCOSE, CAPILLARY
GLUCOSE-CAPILLARY: 127 mg/dL — AB (ref 65–99)
GLUCOSE-CAPILLARY: 49 mg/dL — AB (ref 65–99)
GLUCOSE-CAPILLARY: 86 mg/dL (ref 65–99)
Glucose-Capillary: 111 mg/dL — ABNORMAL HIGH (ref 65–99)
Glucose-Capillary: 135 mg/dL — ABNORMAL HIGH (ref 65–99)
Glucose-Capillary: 205 mg/dL — ABNORMAL HIGH (ref 65–99)
Glucose-Capillary: 42 mg/dL — CL (ref 65–99)
Glucose-Capillary: 53 mg/dL — ABNORMAL LOW (ref 65–99)

## 2015-11-19 LAB — CBC
HCT: 32.9 % — ABNORMAL LOW (ref 35.0–47.0)
HEMOGLOBIN: 11.4 g/dL — AB (ref 12.0–16.0)
MCH: 31.4 pg (ref 26.0–34.0)
MCHC: 34.7 g/dL (ref 32.0–36.0)
MCV: 90.7 fL (ref 80.0–100.0)
PLATELETS: 272 10*3/uL (ref 150–440)
RBC: 3.63 MIL/uL — AB (ref 3.80–5.20)
RDW: 13.2 % (ref 11.5–14.5)
WBC: 7.3 10*3/uL (ref 3.6–11.0)

## 2015-11-19 LAB — BASIC METABOLIC PANEL
Anion gap: 10 (ref 5–15)
BUN: 29 mg/dL — ABNORMAL HIGH (ref 6–20)
CHLORIDE: 100 mmol/L — AB (ref 101–111)
CO2: 28 mmol/L (ref 22–32)
CREATININE: 1.11 mg/dL — AB (ref 0.44–1.00)
Calcium: 9.1 mg/dL (ref 8.9–10.3)
GFR calc non Af Amer: 48 mL/min — ABNORMAL LOW (ref >60)
GFR, EST AFRICAN AMERICAN: 56 mL/min — AB (ref >60)
GLUCOSE: 113 mg/dL — AB (ref 65–99)
Potassium: 3.9 mmol/L (ref 3.5–5.1)
Sodium: 138 mmol/L (ref 135–145)

## 2015-11-19 LAB — CULTURE, BLOOD (ROUTINE X 2)
CULTURE: NO GROWTH
Culture: NO GROWTH

## 2015-11-19 LAB — MAGNESIUM: Magnesium: 1.8 mg/dL (ref 1.7–2.4)

## 2015-11-19 LAB — BRAIN NATRIURETIC PEPTIDE: B Natriuretic Peptide: 3554 pg/mL — ABNORMAL HIGH (ref 0.0–100.0)

## 2015-11-19 LAB — AMMONIA: Ammonia: <9 umol/L — ABNORMAL LOW (ref 9–35)

## 2015-11-19 MED ORDER — FUROSEMIDE 10 MG/ML IJ SOLN
40.0000 mg | Freq: Once | INTRAMUSCULAR | Status: AC
  Administered 2015-11-19: 40 mg via INTRAVENOUS

## 2015-11-19 MED ORDER — METHYLPREDNISOLONE SODIUM SUCC 125 MG IJ SOLR
125.0000 mg | INTRAMUSCULAR | Status: AC
  Filled 2015-11-19: qty 2

## 2015-11-19 MED ORDER — METHYLPREDNISOLONE SODIUM SUCC 40 MG IJ SOLR
40.0000 mg | Freq: Three times a day (TID) | INTRAMUSCULAR | Status: DC
  Administered 2015-11-19 – 2015-11-21 (×5): 40 mg via INTRAVENOUS
  Filled 2015-11-19 (×5): qty 1

## 2015-11-19 MED ORDER — MORPHINE SULFATE (PF) 2 MG/ML IV SOLN
INTRAVENOUS | Status: AC
  Administered 2015-11-19: 2 mg via INTRAVENOUS
  Filled 2015-11-19: qty 1

## 2015-11-19 MED ORDER — LORAZEPAM 2 MG/ML IJ SOLN
1.0000 mg | Freq: Once | INTRAMUSCULAR | Status: AC
  Administered 2015-11-19: 1 mg via INTRAVENOUS

## 2015-11-19 MED ORDER — POLYETHYLENE GLYCOL 3350 17 G PO PACK: 17.0000 g | PACK | Freq: Every day | ORAL | Status: DC | PRN

## 2015-11-19 MED ORDER — INSULIN ASPART 100 UNIT/ML ~~LOC~~ SOLN
0.0000 [IU] | SUBCUTANEOUS | Status: DC
  Administered 2015-11-20 (×2): 1 [IU] via SUBCUTANEOUS
  Administered 2015-11-20: 3 [IU] via SUBCUTANEOUS
  Administered 2015-11-20 – 2015-11-21 (×3): 1 [IU] via SUBCUTANEOUS
  Administered 2015-11-21: 3 [IU] via SUBCUTANEOUS
  Filled 2015-11-19: qty 2
  Filled 2015-11-19: qty 3
  Filled 2015-11-19 (×5): qty 1

## 2015-11-19 MED ORDER — METHYLPREDNISOLONE SODIUM SUCC 125 MG IJ SOLR: 60.0000 mg | Freq: Four times a day (QID) | INTRAMUSCULAR | Status: DC

## 2015-11-19 MED ORDER — DEXTROSE 50 % IV SOLN
1.0000 | Freq: Once | INTRAVENOUS | Status: AC
  Administered 2015-11-19: 50 mL via INTRAVENOUS

## 2015-11-19 MED ORDER — LABETALOL HCL 5 MG/ML IV SOLN
10.0000 mg | Freq: Once | INTRAVENOUS | Status: AC
  Administered 2015-11-19: 17:00:00 via INTRAVENOUS
  Filled 2015-11-19: qty 4

## 2015-11-19 MED ORDER — MORPHINE SULFATE (PF) 2 MG/ML IV SOLN
2.0000 mg | Freq: Once | INTRAVENOUS | Status: AC
  Administered 2015-11-19: 2 mg via INTRAVENOUS

## 2015-11-19 MED ORDER — FUROSEMIDE 10 MG/ML IJ SOLN
INTRAMUSCULAR | Status: AC
  Administered 2015-11-19: 40 mg via INTRAVENOUS
  Filled 2015-11-19: qty 4

## 2015-11-19 MED ORDER — LEVALBUTEROL HCL 1.25 MG/0.5ML IN NEBU
1.2500 mg | INHALATION_SOLUTION | Freq: Once | RESPIRATORY_TRACT | Status: AC
  Administered 2015-11-19: 1.25 mg via RESPIRATORY_TRACT
  Filled 2015-11-19: qty 0.5

## 2015-11-19 MED ORDER — DEXTROSE 50 % IV SOLN
INTRAVENOUS | Status: AC
Start: 2015-11-19 — End: 2015-11-19
  Administered 2015-11-19: 23:00:00
  Filled 2015-11-19: qty 50

## 2015-11-19 NOTE — Progress Notes (Signed)
The Nurse paged the [facility] at 4:39pm and asked him to visit with the [name] who had developed some health complication. The medical time was preparing to move the Pt to [place]. This was a rapid response page. When the [facility] arrived, the Pt was on transit to [name]. While on the way to [name], [name] received a code, stoke page from Ed. [place] decided to attend to code stroke Pt in the ED first, and then visit the [name] later. After visiting two-code stroke Pt's in the Ed, [facility] visited the Pt in the [name] at 6:10pm. Pt had a breathing musk on his mouth and was not able to talk. CH offered a silent prayer for Pt and the assistant nurse who was taking care of the Pt.      11/19/15 1900  Clinical Encounter Type  Visited With Patient  Visit Type Initial;Spiritual support;Critical Care  Referral From Nurse  Consult/Referral To Chaplain  Spiritual Encounters  Spiritual Needs Prayer;Emotional;Other (Comment)  Stress Factors  Patient Stress Factors Not reviewed  Family Stress Factors Other (Comment)

## 2015-11-19 NOTE — Plan of Care (Signed)
Problem: Safety: Goal: Ability to remain free from injury will improve Outcome: Progressing Safety sitter at bedside.

## 2015-11-19 NOTE — Progress Notes (Signed)
SLP Cancellation Note  Patient Details Name: [name] [name] MRN: [id number] DOB: 1940/12/03   Cancelled treatment:       Reason Eval/Treat Not Completed: Patient at procedure or test/unavailable (pt receiving IV line. Will f/u in the morning w/ po trials.) NSG agreed.   Orinda Kenner, MS, CCC-SLP [facility] 11/19/2015, 3:25 PM

## 2015-11-19 NOTE — Progress Notes (Signed)
[place] at [place] NAME: [name] [name]    MR#:  [id number]  DATE OF BIRTH:  [birth year]  SUBJECTIVE:  CHIEF COMPLAINT:  Came to see the patient for acute shortness of breath and rapid response, Patient was diffusely wheezing and using all her accessory muscles to breathe. Delirious  REVIEW OF SYSTEMS:  ROS Unobtainable  DRUG ALLERGIES:   Allergies  Allergen Reactions  . Contrast Media [Iodinated Diagnostic Agents] Other (See Comments)    Pt was sent to the ED following contrast media injection at [place]. Unknown reason. She has been premedicated since without complications. Pt to be premedicated prior to contrast media injections  . Ioxaglate Other (See Comments)    Pt was sent to the ED following contrast media injection at [place]. Unknown reason. She has been premedicated since without complications. Pt to be premedicated prior to contrast media injections  . Sulfa Antibiotics     Other reaction(s): Other (See Comments)  . Tetracycline Hives and Other (See Comments)  . White Petrolatum Other (See Comments)  . Amoxicillin-Pot Clavulanate Rash    Blisters in mouth Other reaction(s): Unknown Blisters in mouth Blisters in mouth  . Tape Rash    VITALS:  Blood pressure (!) 168/111, pulse (!) 150, temperature 97.7 F (36.5 C), temperature source Oral, resp. rate (!) 25, height '5\' 5"'$  (1.651 m), weight 52.8 kg (116 lb 4.8 oz), SpO2 93 %.  PHYSICAL EXAMINATION:  GENERAL:  75 y.o.-year-old patient lying in the bed with acute distress.  EYES: Pupils equal, round, reactive to light and accommodation. No scleral icterus. HEENT: Head atraumatic, normocephalic. Oropharynx and nasopharynx clear.  NECK:  Supple, LUNGS: Diminished  breath sounds bilaterally,   diffuse wheezing, with rales,rhonchi or crepitation.  usING of all  accessory muscles of respiration.  CARDIOVASCULAR: S1, S2  nml ,tachy  ABDOMEN: Soft,  distended. Bowel sounds present.  EXTREMITIES: No pedal edema, cyanosis, or clubbing.  NEUROLOGIC: AMS  PSYCHIATRIC: The patient is DISORIENTED SKIN: No obvious rash   LABORATORY PANEL:   CBC  Recent Labs Lab 11/19/15 0518  WBC 7.3  HGB 11.4*  HCT 32.9*  PLT 272   ------------------------------------------------------------------------------------------------------------------  Chemistries   Recent Labs Lab 11/14/15 2131  11/16/15 1712 11/19/15 0518  NA 127*  < >  --  138  K 3.5  < >  --  3.9  CL 88*  < >  --  100*  CO2 27  < >  --  28  GLUCOSE 115*  < >  --  113*  BUN 13  < >  --  29*  CREATININE 2.23*  < >  --  1.11*  CALCIUM 9.6  < >  --  9.1  MG  --   < > 1.7  --   AST 18  --   --   --   ALT 9*  --   --   --   ALKPHOS 98  --   --   --   BILITOT 0.4  --   --   --   < > = values in this interval not displayed. ------------------------------------------------------------------------------------------------------------------  Cardiac Enzymes No results for input(s): TROPONINI in the last 168 hours. ------------------------------------------------------------------------------------------------------------------  RADIOLOGY:  No results found.  EKG:   Orders placed or performed during the hospital encounter of 11/14/15  . ED EKG 12-Lead  . ED EKG 12-Lead  . EKG 12-Lead  . EKG 12-Lead  . EKG 12-Lead  .  EKG 12-Lead    ASSESSMENT AND PLAN:   Came to see the patient rapid response for shortness of breath  #Acute  hypoxic respiratory failure secondary to COPD exacerbation  ? CHF Transfer patient to intensive care unit Stat ABGs, chest x-ray and 12-lead EKG ordered Lasix IV and Solu-Medrol 125 mg IV was given, STAT nebs Patient is placed on BiPAP Call placed and discussed with intensivist Dr. Stevenson Clinch    #Acute delirium after extubation-could be from sedatives used in [name] We'll make nothing by mouth  #Sepsis secondary to pneumonia on admission  resolved at this time Repeat chest x-ray ordered  History of fibromyalgia     All the records are reviewed and case discussed with Care Management/Social Workerr. Management plans discussed with the patient's son over phone he is aware of the critical situation and he is in agreement With the current plan of care. All questions were answered to his satisfaction   CODE STATUS: fc ,son is the HCPOA  TOTAL  CRITICAL CARE TIME TAKING CARE OF THIS PATIENT: 34  minutes.   POSSIBLE D/C IN ?  DAYS, DEPENDING ON CLINICAL CONDITION.  Note: This dictation was prepared with Dragon dictation along with smaller phrase technology. Any transcriptional errors that result from this process are unintentional.   Nicholes Mango M.D on 11/19/2015 at 5:06 PM  Between 7am to 6pm - Pager - [phone] After 6pm go to [email or web address] - password [email or web address] [facility]  Office  [phone]  CC: Primary care physician; Tracie Harrier, MD

## 2015-11-19 NOTE — Progress Notes (Signed)
Son is unable to be reached via telephone at this time to obtain consent for PICC line. Suggested by technician to place an US guided PIV. MD consulted and permission given. Madlyn Frankel, RN

## 2015-11-19 NOTE — Progress Notes (Addendum)
PULMONARY / CRITICAL CARE MEDICINE   Name: [name] [name] MRN: [id number] DOB: 11-11-1940    ADMISSION DATE:  11/14/2015 CONSULTATION DATE:  11/15/15  REFERRING MD:  Dr Jannifer Franklin  CHIEF COMPLAINT: Acute Shortness of breath  Patient Profile [name] [name] is a60 yo female with medical history significant for Asthma, Breast Cancer(2009), CHF, COPD, Fibromyalgia, hypertension and  tobacco abuse.  Patient presented to ED via EMS with altered mental status and shortness of breath.  Apparently she fell and became increasingly confused.  Patient is on home -O2 but most of the times she is non compliant.  Her CT chest was concering for PNA.  Patient apparently became more hypoxic and was placed on BiPAP.  PCCM team was consulted for further recommendation.   SIGNIFICANT EVENTS: 11/1  Patient admited to [name] with acute respiratory failure due to PNA, requiring BiPAP 11/2 Intubated and mechanically ventillated 11/4 extubated 11/5 transferred out of the IC  SUBJECTIVE:  Patient awake and alert, no significant complaints of pain, on [place]  VITAL SIGNS: BP (!) 155/70 (BP Location: Right Arm)   Pulse (!) 102   Temp 98.1 F (36.7 C) (Oral)   Resp 18   Ht '5\' 5"'$  (1.651 m) Comment: estimated  Wt 116 lb 4.8 oz (52.8 kg)   SpO2 100%   BMI 19.35 kg/m   HEMODYNAMICS:    VENTILATOR SETTINGS: FiO2 (%):  [28 %] 28 %  INTAKE / OUTPUT: I/O last 3 completed shifts: In: 42 [P.O.:405; IV Piggyback:300] Out: -   PHYSICAL EXAMINATION: General:  Elderly white female, intubated and mechanically ventillated Neuro: opens eyes to voice, follows simple commands HEENT:  Atraumatic, normocephalic, no discharge, no JVD appreciated Cardiovascular:  S1, S2, regular rate and rhythm, no MRG noted Lungs: diminished bibasilar, no wheezes,crackles, no ronchi Abdomen: soft and non Tender, no guarding noted Musculoskeletal:  No inflammation/deformity noted Skin:  Grossly intact  LABS:  BMET  Recent Labs Lab  11/15/15 0418 11/16/15 0435 11/19/15 0518  NA 128* 131* 138  K 3.3* 4.0 3.9  CL 95* 99* 100*  CO2 '26 24 28  '$ BUN 13 17 29*  CREATININE 2.19* 1.18* 1.11*  GLUCOSE 98 208* 113*    Electrolytes  Recent Labs Lab 11/15/15 0418  11/15/15 1726 11/16/15 0435 11/16/15 1712 11/19/15 0518  CALCIUM 8.2*  --   --  9.0  --  9.1  MG 1.8  < > 1.7 1.7 1.7  --   PHOS 4.1  < > 3.2 2.3* 2.3*  --   < > = values in this interval not displayed.  CBC  Recent Labs Lab 11/15/15 0418 11/16/15 0435 11/19/15 0518  WBC 11.6* 12.2* 7.3  HGB 11.8* 11.5* 11.4*  HCT 35.2 34.3* 32.9*  PLT 207 233 272    Coag's No results for input(s): APTT, INR in the last 168 hours.  Sepsis Markers  Recent Labs Lab 11/14/15 2131 11/16/15 0435 11/17/15 0412  LATICACIDVEN 1.3  --   --   PROCALCITON 0.13 0.25 0.17    ABG  Recent Labs Lab 11/15/15 0615 11/15/15 1100 11/16/15 0451  PHART 7.19* 7.24* 7.32*  PCO2ART 66* 55* 47  PO2ART 47* 60* 105    Liver Enzymes  Recent Labs Lab 11/14/15 2131  AST 18  ALT 9*  ALKPHOS 98  BILITOT 0.4  ALBUMIN 3.9    Cardiac Enzymes No results for input(s): TROPONINI, PROBNP in the last 168 hours.  Glucose  Recent Labs Lab 11/17/15 2051 11/18/15 0728 11/18/15 1142 11/18/15 1654  11/18/15 2101 11/19/15 0741  GLUCAP 97 96 115* 103* 130* 111*    Imaging No results found.   STUDIES:  11/14/15 CT head>> Negative for any intracranial abnormality 11/14/15 CT chest>>Mild to moderate diffuse emphysematous changes bilaterally.Interim finding of scattered areas of mild ground-glass densitywithin the bilateral upper lobes suggesting foci of pneumonitis.Soft tissue thickening around the central airways with centralairspace disease, suggests central airways inflammation and possible pneumonia. No effusion. CULTURES: 11/1 BC>>ntd 11/1SC>>normal flora  11/1 UC>>ngtd  ANTIBIOTICS: 11/2 Ceftriaxone>> 11/2 Azithromycin>> LINES/TUBES: NONE    ASSESSMENT  / PLAN:  PULMONARY A: Acute Hypoxic Respiratory failure-resolved on [place] now Community Acquired PNA Copd  Tobacco abuse P:   Continue Azithromycin/ceftriaxone Continue duoneb/dulera Wean o2 as tolerated Incentive spirometry  CARDIOVASCULAR A:  Septic shock-resolved Congestive Heart Failure Hx of hypertension P:  Keep MAP goals>65  RENAL A:   Acute kidney injury related to septic schock Hyponatremia P:   Resolving well Na improving   GASTROINTESTINAL A:   No active issues  HEMATOLOGIC A:   No active issues P:  transfuse if Hgb<7 Heparin for DVT prophylaxis  INFECTIOUS A:   Community Acquired PNA P:   Monitor fever curve Antibiotics as above Follow cultures.  ENDOCRINE A:   No active issues  P:    BS checks with BMP  NEUROLOGIC A:   Acute encephalopathy related to sepsis-resolved P:   - now resolved  Patient will continue to follow up with Dr. Ginette Pitman after discharge.   Thank you for consulting [facility], we will signoff at this time.  Please feel free to contact us with any questions at [phone] (please enter 7-digits).  Pulmonary Care time - 35 mins  Vilinda Boehringer, MD [facility] Pager [phone] (please enter 7-digits) On Call Pager - [phone] (please enter 7-digits)

## 2015-11-19 NOTE — Progress Notes (Addendum)
[name] at [place] NAME: [name] [name]    MR#:  [id number]  DATE OF BIRTH:  1940/07/02  SUBJECTIVE:  CHIEF COMPLAINT:   Chief Complaint  Patient presents with  . Shortness of Breath  . Altered Mental Status   - still has the safety sitter - confused but less agitated, refusing eat anything, appears dyspneic at times  REVIEW OF SYSTEMS:  Review of Systems  Unable to perform ROS: Mental acuity    DRUG ALLERGIES:   Allergies  Allergen Reactions  . Contrast Media [Iodinated Diagnostic Agents] Other (See Comments)    Pt was sent to the ED following contrast media injection at [place]. Unknown reason. She has been premedicated since without complications. Pt to be premedicated prior to contrast media injections  . Ioxaglate Other (See Comments)    Pt was sent to the ED following contrast media injection at [place]. Unknown reason. She has been premedicated since without complications. Pt to be premedicated prior to contrast media injections  . Sulfa Antibiotics     Other reaction(s): Other (See Comments)  . Tetracycline Hives and Other (See Comments)  . White Petrolatum Other (See Comments)  . Amoxicillin-Pot Clavulanate Rash    Blisters in mouth Other reaction(s): Unknown Blisters in mouth Blisters in mouth  . Tape Rash    VITALS:  Blood pressure (!) 155/70, pulse (!) 102, temperature 98.1 F (36.7 C), temperature source Oral, resp. rate 18, height '5\' 5"'$  (1.651 m), weight 52.8 kg (116 lb 4.8 oz), SpO2 95 %.  PHYSICAL EXAMINATION:  Physical Exam  GENERAL:  75 y.o.-year-old patient lying in the bed with no acute distress.  EYES: Pupils equal, round, reactive to light and accommodation. No scleral icterus. Extraocular muscles intact.  HEENT: Head atraumatic, normocephalic. Oropharynx and nasopharynx clear.  NECK:  Supple, no jugular venous distention. No thyroid enlargement, no tenderness.  LUNGS: Normal  breath sounds bilaterally, scattered wheezing, no rales,rhonchi or crepitation. No use of accessory muscles of respiration. Diminished breath sounds CARDIOVASCULAR: S1, S2 normal. No murmurs, rubs, or gallops.  ABDOMEN: Soft, nontender, nondistended. Bowel sounds present. No organomegaly or mass.  EXTREMITIES: No pedal edema, cyanosis, or clubbing.  NEUROLOGIC: Cranial nerves II through XII are intact. Muscle strength 5/5 in all extremities. Sensation intact. Gait not checked.  moving all extremities fine. PSYCHIATRIC: The patient is alert but not oriented, but following commands today SKIN: No obvious rash, lesion, or ulcer.    LABORATORY PANEL:   CBC  Recent Labs Lab 11/19/15 0518  WBC 7.3  HGB 11.4*  HCT 32.9*  PLT 272   ------------------------------------------------------------------------------------------------------------------  Chemistries   Recent Labs Lab 11/14/15 2131  11/16/15 1712 11/19/15 0518  NA 127*  < >  --  138  K 3.5  < >  --  3.9  CL 88*  < >  --  100*  CO2 27  < >  --  28  GLUCOSE 115*  < >  --  113*  BUN 13  < >  --  29*  CREATININE 2.23*  < >  --  1.11*  CALCIUM 9.6  < >  --  9.1  MG  --   < > 1.7  --   AST 18  --   --   --   ALT 9*  --   --   --   ALKPHOS 98  --   --   --   BILITOT 0.4  --   --   --   < > =  values in this interval not displayed. ------------------------------------------------------------------------------------------------------------------  Cardiac Enzymes No results for input(s): TROPONINI in the last 168 hours. ------------------------------------------------------------------------------------------------------------------  RADIOLOGY:  No results found.  EKG:   Orders placed or performed during the hospital encounter of 11/14/15  . ED EKG 12-Lead  . ED EKG 12-Lead  . EKG 12-Lead  . EKG 12-Lead    ASSESSMENT AND PLAN:   [name] 4-year-old female with past medical history significant for COPD/asthma on home  oxygen noncompliant, fibromyalgia, hypertension, neuropathy presented to hospital secondary to acute hypoxic respiratory failure  #1 acute hypoxic respiratory failure-secondary to COPD exacerbation and pneumonia on admission. -Admitted to [name] for BiPAP, however failed and was intubated. She got extubated and currently stable on nasal cannula oxygen. -Continue Rocephin and azithromycin for pneumonia. Repeat chest x-ray showing only atelectasis. -Continue steroids and inhaler/nebulizer for her COPD.  #2 acute delirium-after extubation. Could be the sedatives in [name]. -Added risperidone. Some improvement seen today as pleasantly confused. Also on Klonopin. Continue Haldol as needed. No focal neuro deficits at this time. -Has a Air cabin crew in the room. - reevaluate swallowing today- was placed on puree and nectar thick liquids after extubation  #3 sepsis-secondary to pneumonia on admission. Resolved at this time.  #4 fibromyalgia-pain medications are on hold  #5 DVT prophylaxis-on subcutaneous heparin   Physical therapy once more alert Son in the room and updated  All the records are reviewed and case discussed with Care Management/Social Workerr. Management plans discussed with the patient, family and they are in agreement.  CODE STATUS: Full code  TOTAL TIME TAKING CARE OF THIS PATIENT: 37 minutes.   POSSIBLE D/C IN 1-2 DAYS, DEPENDING ON CLINICAL CONDITION.   Gladstone Lighter M.D on 11/19/2015 at 1:13 PM  Between 7am to 6pm - Pager - [phone]  After 6pm go to [email or web address] - password [email or web address] [facility]  Office  [phone]  CC: Primary care physician; Tracie Harrier, MD

## 2015-11-19 NOTE — Significant Event (Signed)
Rapid Response Event Note  Overview: Time Called: 1721 Arrival Time: 1730 Event Type: Respiratory  Initial Focused Assessment:Patient lying in bed, respiratory distress, HR 150s, RRs 40s.   Interventions:NRB applied  Plan of Care (if not transferred):  Event Summary: Name of Physician Notified: [place] at 1743    at    Outcome: Transferred (Comment) Patient transferred with this Rn with telemetry monitoring present in bed.  In room 13, bipap applied, ABG obtained, 2 mg morphine administered. Tachycardia, 10 mg labetalol and additional 2 mg morphine administered per Dr. Stevenson Clinch.    Gita Kudo

## 2015-11-19 NOTE — Progress Notes (Signed)
UPDATE  S: noted to have tachypneic and respiratory distress, desaturating, transferred to the [name], placed on Bipap, more comfortable now  O: Blood pressure (!) 168/111, pulse (!) 150, temperature 97.7 F (36.5 C), temperature source Oral, resp. rate (!) 25, height '5\' 5"'$  (1.651 m), weight 116 lb 4.8 oz (52.8 kg), SpO2 93 %.  GEN: respiratory distress, awake, confusted HEENT - PERRLA, [place]/AT, + use of SCM CVS - s1, s2, no murmurs, tachycardia RESP - coarse upper airway sounds, dec bs, tachypnea, on bipap ABD - soft, nt, nd, BS MSK - no lesions  A/P:75 year old female with past medical history significant for COPD/asthma on home oxygen noncompliant, fibromyalgia, hypertension, neuropathynow with acute hypoxic respiratory failure in rapid response  Acute hypoxic respiratory failure AECOPD Delirium Sepsis  - resolved CHF disastolic  P: - cont with bipap, give '2mg'$  IV morphine, possibly flash pulmonary edema, given elevated BP - check CXR - patient got lasix '40mg'$  IV - cont with duonebs, give xopenox x 2, give '10mg'$  IV labetalol - cont with steroids - cont with abx - check troponins  Critical Care time - 35 mins  Vilinda Boehringer, MD [facility] Pager [phone] (please enter 7-digits) On Call Pager - [phone] (please enter 7-digits)

## 2015-11-19 NOTE — Progress Notes (Signed)
Patient appears tachypnic with increased HR of 150; accessory muscle use. With change in patient status, RRT called. Madlyn Frankel, RN

## 2015-11-19 NOTE — Progress Notes (Signed)
Son - Ronalee Belts - made aware via telephone of transfer to [name]. Madlyn Frankel, RN

## 2015-11-20 ENCOUNTER — Inpatient Hospital Stay: Payer: Commercial Managed Care - HMO

## 2015-11-20 DIAGNOSIS — A419 Sepsis, unspecified organism: Principal | ICD-10-CM

## 2015-11-20 LAB — BLOOD GAS, ARTERIAL
Acid-Base Excess: 9.5 mmol/L — ABNORMAL HIGH (ref 0.0–2.0)
BICARBONATE: 32.4 mmol/L — AB (ref 20.0–28.0)
Delivery systems: POSITIVE
EXPIRATORY PAP: 6
FIO2: 0.3
Inspiratory PAP: 12
O2 SAT: 96.3 %
PH ART: 7.55 — AB (ref 7.350–7.450)
PO2 ART: 73 mmHg — AB (ref 83.0–108.0)
Patient temperature: 37
pCO2 arterial: 37 mmHg (ref 32.0–48.0)

## 2015-11-20 LAB — CBC WITH DIFFERENTIAL/PLATELET
BASOS ABS: 0.1 10*3/uL (ref 0–0.1)
BASOS PCT: 1 %
EOS ABS: 0 10*3/uL (ref 0–0.7)
EOS PCT: 0 %
HCT: 35 % (ref 35.0–47.0)
Hemoglobin: 12.1 g/dL (ref 12.0–16.0)
Lymphocytes Relative: 10 %
Lymphs Abs: 1 10*3/uL (ref 1.0–3.6)
MCH: 31.2 pg (ref 26.0–34.0)
MCHC: 34.4 g/dL (ref 32.0–36.0)
MCV: 90.5 fL (ref 80.0–100.0)
MONO ABS: 0.6 10*3/uL (ref 0.2–0.9)
Monocytes Relative: 6 %
NEUTROS ABS: 8.4 10*3/uL — AB (ref 1.4–6.5)
Neutrophils Relative %: 83 %
PLATELETS: 269 10*3/uL (ref 150–440)
RBC: 3.87 MIL/uL (ref 3.80–5.20)
RDW: 13.4 % (ref 11.5–14.5)
WBC: 10 10*3/uL (ref 3.6–11.0)

## 2015-11-20 LAB — BASIC METABOLIC PANEL
ANION GAP: 10 (ref 5–15)
BUN: 28 mg/dL — AB (ref 6–20)
CALCIUM: 9 mg/dL (ref 8.9–10.3)
CO2: 35 mmol/L — ABNORMAL HIGH (ref 22–32)
Chloride: 95 mmol/L — ABNORMAL LOW (ref 101–111)
Creatinine, Ser: 1.25 mg/dL — ABNORMAL HIGH (ref 0.44–1.00)
GFR calc Af Amer: 48 mL/min — ABNORMAL LOW (ref >60)
GFR, EST NON AFRICAN AMERICAN: 42 mL/min — AB (ref >60)
GLUCOSE: 131 mg/dL — AB (ref 65–99)
Potassium: 3.5 mmol/L (ref 3.5–5.1)
Sodium: 140 mmol/L (ref 135–145)

## 2015-11-20 LAB — GLUCOSE, CAPILLARY
GLUCOSE-CAPILLARY: 109 mg/dL — AB (ref 65–99)
Glucose-Capillary: 103 mg/dL — ABNORMAL HIGH (ref 65–99)
Glucose-Capillary: 125 mg/dL — ABNORMAL HIGH (ref 65–99)
Glucose-Capillary: 118 mg/dL — ABNORMAL HIGH (ref 65–99)
Glucose-Capillary: 137 mg/dL — ABNORMAL HIGH (ref 65–99)
Glucose-Capillary: 128 mg/dL — ABNORMAL HIGH (ref 65–99)
Glucose-Capillary: 208 mg/dL — ABNORMAL HIGH (ref 65–99)

## 2015-11-20 LAB — TROPONIN I: Troponin I: 0.31 ng/mL (ref <0.03)

## 2015-11-20 LAB — MAGNESIUM: MAGNESIUM: 1.9 mg/dL (ref 1.7–2.4)

## 2015-11-20 MED ORDER — FUROSEMIDE 20 MG PO TABS
20.0000 mg | ORAL_TABLET | Freq: Every day | ORAL | Status: DC
Start: 2015-11-20 — End: 2015-11-22
  Administered 2015-11-20 – 2015-11-21 (×2): 20 mg via ORAL
  Filled 2015-11-20 (×2): qty 1

## 2015-11-20 MED ORDER — ENSURE ENLIVE PO LIQD
237.0000 mL | Freq: Two times a day (BID) | ORAL | Status: DC
  Administered 2015-11-20 – 2015-11-23 (×5): 237 mL via ORAL

## 2015-11-20 MED ORDER — ENOXAPARIN SODIUM 40 MG/0.4ML ~~LOC~~ SOLN
40.0000 mg | SUBCUTANEOUS | Status: DC
  Administered 2015-11-20 – 2015-11-28 (×8): 40 mg via SUBCUTANEOUS
  Filled 2015-11-20 (×8): qty 0.4

## 2015-11-20 MED ORDER — POTASSIUM CHLORIDE 10 MEQ/50ML IV SOLN: 10.0000 meq | INTRAVENOUS | Status: DC

## 2015-11-20 MED ORDER — POLYETHYLENE GLYCOL 3350 17 G PO PACK
17.0000 g | PACK | Freq: Once | ORAL | Status: AC
  Administered 2015-11-20: 17 g via ORAL
  Filled 2015-11-20: qty 1

## 2015-11-20 MED ORDER — POTASSIUM CHLORIDE 10 MEQ/50ML IV SOLN
10.0000 meq | Freq: Once | INTRAVENOUS | Status: AC
  Administered 2015-11-20: 10 meq via INTRAVENOUS
  Filled 2015-11-20: qty 50

## 2015-11-20 NOTE — Progress Notes (Signed)
PULMONARY / CRITICAL CARE MEDICINE   Name: [name] [name] MRN: [id number] DOB: [birth year]    ADMISSION DATE:  11/14/2015 CONSULTATION DATE:  11/15/15  REFERRING MD:  Dr [name] [name]  CHIEF COMPLAINT: Acute Shortness of breath  Patient Profile Pt admitted 11/1 due to c/o shortness of breath and altered mental status post fall CT of chest on admission revealed PNA. She became hypoxic with worsening shortness of breath in the ER requiring Bipap, however symptoms did not improve she was subsequently intubated on 11/2.   SIGNIFICANT EVENTS: 11/1  Patient admited to [name] with acute respiratory failure due to PNA, requiring BiPAP 11/2 Intubated and mechanically ventillated 11/4 Extubated 11/5 Transferred out of [name] 11/6 Rapid response called pt in acute respiratory failure therefore transferred back to [name] requiring Bipap 11/7 weaned off BiPAP, back to baseline breathing, more alert and comfortable  SUBJECTIVE:  Pt remained on Bipap throughout the night with sitter at bedside for safety pt now following commands.  VITAL SIGNS: BP 92/68   Pulse (!) 114   Temp (!) 96.8 F (36 C) (Axillary)   Resp (!) 28   Ht '5\' 5"'$  (1.651 m) Comment: estimated  Wt 116 lb 4.8 oz (52.8 kg)   SpO2 97%   BMI 19.35 kg/m   HEMODYNAMICS:   INTAKE / OUTPUT: I/O last 3 completed shifts: In: 825 [P.O.:525; IV Piggyback:300] Out: 650 [Urine:650]  PHYSICAL EXAMINATION: General:  Acutely ill caucasian female, well developed, well nourished Neuro: opens eyes to voice, follows simple commands, PERRLA HEENT:  Atraumatic, normocephalic, no discharge, no JVD appreciated Cardiovascular:  s1s2,sinus tach, no M/R/G noted Lungs: diminished throughout, no wheezes,crackles, no rhonchi, even, non labored on Bipap Abdomen: +BS x4, soft and non tender, non distended Musculoskeletal:  No inflammation/deformity noted Skin:  Grossly intact no rashes or lesions  STUDIES:  11/14/15 CT head>> Negative for any intracranial  abnormality 11/14/15 CT chest>>Mild to moderate diffuse emphysematous changes bilaterally.Interim finding of scattered areas of mild ground-glass densitywithin the bilateral upper lobes suggesting foci of pneumonitis.Soft tissue thickening around the central airways with central airspace disease, suggests central airways inflammation and possible pneumonia. No effusion. 11/2>>EF 55% to 60%  CULTURES: 11/1 BC>>negative 11/6 11/1SC>>normal flora 11/4  11/1 UC>>negative 11/3 11/5 Blood x2>>negative  ANTIBIOTICS: 11/2 Ceftriaxone>> 11/2 Azithromycin>>11/6  LINES/TUBES: Right upper arm PICC 11/6>>  ASSESSMENT / PLAN:  PULMONARY A: Acute Hypoxic Respiratory Failure secondary to Pulmonary Edema  Community Acquired PNA-improving Hx: COPD, Chronic O2 @ 2.5L, Asthma, and Tobacco Abuse P:   PRN Bipap for now, wean as tolerated Maintain O2 sats 88% to 92% Continue bronchodilators Pulmonary Hygiene  CARDIOVASCULAR A:  Septic shock-resolved Congestive Heart Failure Hx: HTN P:  Continuous telemetry monitoring Maintain map >65  RENAL A:   Acute kidney injury related to septic shock Hyponatremia-resolved Metabolic alkalosis  P:   Trend BMP's Monitor UOP Foley Catheter Replace electrolytes as indicated Repeat ABG today 11/7  GASTROINTESTINAL A:   No acute issues P: Continue current diet as indicated Continue Pepcid  HEMATOLOGIC A:   No active issues P:  Subq heparin for VTE prophylaxis Trend CBC Monitor for s/sx of bleeding Transfuse for hgb <7  INFECTIOUS A:   CAP-resolving P:   Trend WBC's and monitor fever curve Continue abx as listed above Follow cultures  ENDOCRINE A:   Hypoglycemia-resolved P:   CBG's q4hrs  SSI  NEUROLOGIC A:   Acute encephalopathy related to sepsis-resolved Delirium with agitation-improving  P:   Sitter at bedside for pt safety Minimize  sedating medication use Lights on during the day Frequent reorientation  Patient  will continue to follow up with Dr. Ginette [name] after discharge.   [name] [name], [place] Pager [phone] (please enter 7 digits) PCCM Consult Pager [phone] (please enter 7 digits)  STAFF NOTE: I, Dr. Vilinda [name] have personally reviewed patient's available data, including medical history, events of note, physical examination and test results as part of my evaluation. I have discussed with NP [name] [name]  and other care providers such as pharmacist, RN and RRT.    S: Patient is alert, awake, following commands, mild confusion noted. She is now weaned off BiPAP and back on 2.5 L of nasal cannula with no significant respiratory distress noted this morning  A:75 year old female with past medical history significant for COPD/asthma on home oxygen noncompliant, fibromyalgia, hypertension, neuropathy now with acute hypoxic respiratory failure in rapid response Previous [name] admission for altered mental status with septic shock secondary to pneumonia and was on pressors and intubated from November 2 to November 4.   Acute hypoxic respiratory failure - resolving, back to 2.5L [place] O2 AECOPD Delirium Sepsis  - resolved CHF disastolic  P:  -Patient much improved this morning, no longer needing BiPAP, back to nasal cannula -Pulmonary exam with no significant wheezing or adventitious breath sounds -BiPAP as needed -Keep O2 sats 88-92% -Continue bronchodilators and antibiotics -Continue steroids, -Keep map>65   .  Rest per NP/medical resident whose note is outlined above and that I agree with  The patient is critically ill with multiple organ systems failure and requires high complexity decision making for assessment and support, frequent evaluation and titration of therapies, application of advanced monitoring technologies and extensive interpretation of multiple databases.   Critical Care Time devoted to patient care services described in this note is  35 Minutes.   This  time reflects time of care of this signee Dr [name] [name].  This critical care time does not reflect procedure time, or teaching time or supervisory time of PA/NP/Med-student/Med Resident etc but could involve care discussion time.  [name] Boehringer, MD [facility] Pager [phone] (please enter 7-digits) On Call Pager [phone] (please enter 7-digits)  Note: This note was prepared with Dragon dictation along with smaller phrase technology. Any transcriptional errors that result from this process are unintentional.

## 2015-11-20 NOTE — Progress Notes (Signed)
Speech Language Pathology Treatment: Dysphagia  Patient Details Name: [name] [name] MRN: [id number] DOB: 1940-11-26 Today's Date: 11/20/2015 Time: 1215-1300 SLP Time Calculation (min) (ACUTE ONLY): 45 min  Assessment / Plan / Recommendation Clinical Impression  Pt appeared to adequately tolerate trials of thin liquids via cup then straw as well as trials of mech soft foods(peanut butter crackers) w/ no overt s/s of aspiration noted. Pt required the moderate encouragement of her family members to accept foods/liquids d/t her declined Cognitive status and agitation/refusal.    HPI        SLP Plan  Continue with current plan of care     Recommendations  Diet recommendations: Dysphagia 3 (mechanical soft);Thin liquid Liquids provided via: Cup;Straw Medication Administration: Whole meds with puree (or crushed as needed/able to) Supervision: Patient able to self feed;Staff to assist with self feeding;Full supervision/cueing for compensatory strategies Compensations: Minimize environmental distractions;Slow rate;Small sips/bites;Follow solids with liquid Postural Changes and/or Swallow Maneuvers: Seated upright 90 degrees;Upright 30-60 min after meal                General recommendations:  (Dietician) Oral Care Recommendations: Oral care BID;Staff/trained caregiver to provide oral care Follow up Recommendations: None (TBD) Plan: Continue with current plan of care       [place], Knowles, CCC-SLP  Watson,Katherine 11/20/2015, 3:38 PM

## 2015-11-20 NOTE — Progress Notes (Signed)
PT Cancellation Note  Patient Details Name: [name] [name] MRN: [id number] DOB: 08/14/1940   Cancelled Treatment:    Reason Eval/Treat Not Completed: Other (comment) (Consult received and chart reviewed.  Patient resting at this time; family member at bedside requests therapist re-attempt at later time/date.  Will re-attempt next date.)   Nela Bascom H. Owens Shark, PT, DPT, NCS 11/20/15, 2:36 PM [phone]

## 2015-11-20 NOTE — Plan of Care (Signed)
Patient's live-in caregiver/son indicates that patient is not at baseline and the confusion is getting worse.  Dr. Informed.

## 2015-11-20 NOTE — Progress Notes (Signed)
Nutrition Follow-up  DOCUMENTATION CODES:   Not applicable  INTERVENTION:  -Recommend Magic Cup on all meal trays (pt likes Orange) -Recommend Ensure Enlive po BID, each supplement provides 350 kcal and 20 grams of protein -Recommend feeding assistance and encouragement at meal times to promote po intake.  -Lack of BM discussed during [name] rounds, bowel regimen adjusted  NUTRITION DIAGNOSIS:   Inadequate oral intake related to acute illness as evidenced by NPO status.  Continues but being addressed as diet advanced, adding supplements with meals and between meals  GOAL:   Patient will meet greater than or equal to 90% of their needs  MONITOR:   PO intake, Supplement acceptance, Labs, Weight trends  REASON FOR ASSESSMENT:   Ventilator    ASSESSMENT:   75 yo female admitted with acute respiratory failure from COPD exacerbation and pneumonia requiring intubation on 11/2.   11/4 Extubated, diet advanced to Dysphagia I, Honey Thick  Pt alert, confused. Pt reports she did not eat breakfast this AM but did not want anything. Partially eaten Magic Cup on tray table, pt indicates she does like the [employer] but does not want any more right now. Per RN, pt did eat some breakfast this AM but unsure how much. Recorded po intake 0-25% of meals since diet advancement on 11/4. SLP progressed to Dysphagia III with Thin Liquids after breakfast this AM  Labs and Meds reviewed  Diet Order:  DIET DYS 3 Room service appropriate? Yes with Assist; Fluid consistency: Thin  Skin:  Reviewed, no issues  Last BM:  no documented BM since admission, constipation  Height:   Ht Readings from Last 1 Encounters:  11/15/15 '5\' 5"'$  (1.651 m)    Weight:   Wt Readings from Last 1 Encounters:  11/20/15 113 lb 8.6 oz (51.5 kg)    Ideal Body Weight:     BMI:  Body mass index is 18.89 kg/m.  Estimated Nutritional Needs:   Kcal:  1500-1800 kcals  Protein:  70-78 g  Fluid:  >/= 1.5  L  EDUCATION NEEDS:   No education needs identified at this time  [place], [place], [place] [phone] Pager  [phone] Weekend/On-Call Pager

## 2015-11-20 NOTE — Progress Notes (Signed)
Pt being transferred to room 216. Report called to [facility]. Pt and belongings transferred to room 216 without incident.

## 2015-11-20 NOTE — Progress Notes (Signed)
MEDICATION RELATED CONSULT NOTE - INITIAL   Pharmacy Consult for ceftriaxone dosing and constipation management in this 42 yoF admitted with sepsis secondary to PNA.  1. Ceftriaxone: pt is on day 7 of ceftriaxone 1 g q 24 hours. Per rounds, MD wants to keep the stop date entered (9 days total). Pt also finished a course of azithromycin.  2. Constipation management: pt has senna-docusate 2 tablets bid scheduled and has no documented bowel movement. Pt received one dose PEG 17 g today. Appetite is poor. Will continue to monitor.  Allergies  Allergen Reactions  . Contrast Media [Iodinated Diagnostic Agents] Other (See Comments)    Pt was sent to the ED following contrast media injection at [place]. Unknown reason. She has been premedicated since without complications. Pt to be premedicated prior to contrast media injections  . Ioxaglate Other (See Comments)    Pt was sent to the ED following contrast media injection at [place]. Unknown reason. She has been premedicated since without complications. Pt to be premedicated prior to contrast media injections  . Sulfa Antibiotics     Other reaction(s): Other (See Comments)  . Tetracycline Hives and Other (See Comments)  . White Petrolatum Other (See Comments)  . Amoxicillin-Pot Clavulanate Rash    Blisters in mouth Other reaction(s): Unknown Blisters in mouth Blisters in mouth  . Tape Rash    Patient Measurements: Height: '5\' 5"'$  (165.1 cm) (estimated) Weight: 113 lb 8.6 oz (51.5 kg) IBW/kg (Calculated) : 57  Vital Signs: Temp: 99 F (37.2 C) (11/07 0800) Temp Source: Oral (11/07 0800) BP: 108/64 (11/07 1000) Pulse Rate: 99 (11/07 1000) Intake/Output from previous day: 11/06 0701 - 11/07 0700 In: 170 [P.O.:120; IV Piggyback:50] Out: 6578 [IONGE:9528] Intake/Output from this shift: Total I/O In: -  Out: 35 [Urine:35]  Labs:  Recent Labs  11/19/15 0518 11/19/15 2216 11/20/15 0401  WBC 7.3  --  10.0  HGB  11.4*  --  12.1  HCT 32.9*  --  35.0  PLT 272  --  269  CREATININE 1.11*  --  1.25*  MG  --  1.8 1.9   Estimated Creatinine Clearance: 32.6 mL/min (by C-G formula based on SCr of 1.25 mg/dL (H)).   Microbiology: Recent Results (from the past 720 hour(s))  Blood Culture (routine x 2)     Status: None   Collection Time: 11/14/15  9:31 PM  Result Value Ref Range Status   Specimen Description BLOOD LT FOREARM  Final   Special Requests BOTTLES DRAWN AEROBIC AND ANAEROBIC 6CC  Final   Culture NO GROWTH 5 DAYS  Final   Report Status 11/19/2015 FINAL  Final  Blood Culture (routine x 2)     Status: None   Collection Time: 11/14/15  9:31 PM  Result Value Ref Range Status   Specimen Description BLOOD LEFT AC  Final   Special Requests BOTTLES DRAWN AEROBIC AND ANAEROBIC ANA5CC AER [place]  Final   Culture NO GROWTH 5 DAYS  Final   Report Status 11/19/2015 FINAL  Final  Urine culture     Status: None   Collection Time: 11/14/15  9:31 PM  Result Value Ref Range Status   Specimen Description URINE, RANDOM  Final   Special Requests NONE  Final   Culture NO GROWTH Performed at [facility]   Final   Report Status 11/16/2015 FINAL  Final  MRSA PCR Screening     Status: None   Collection Time: 11/15/15  2:36 AM  Result  Value Ref Range Status   MRSA by PCR NEGATIVE NEGATIVE Final    Comment:        The GeneXpert MRSA Assay (FDA approved for NASAL specimens only), is one component of a comprehensive MRSA colonization surveillance program. It is not intended to diagnose MRSA infection nor to guide or monitor treatment for MRSA infections.   Culture, respiratory (NON-Expectorated)     Status: None   Collection Time: 11/15/15 11:59 AM  Result Value Ref Range Status   Specimen Description TRACHEAL ASPIRATE  Final   Special Requests NONE  Final   Gram Stain   Final    FEW WBC PRESENT, PREDOMINANTLY PMN RARE GRAM POSITIVE COCCI IN PAIRS RARE GRAM NEGATIVE RODS FEW SQUAMOUS  EPITHELIAL CELLS PRESENT    Culture   Final    Consistent with normal respiratory flora. Performed at [facility]    Report Status 11/17/2015 FINAL  Final  CULTURE, BLOOD (ROUTINE X 2) w Reflex to ID Panel     Status: None (Preliminary result)   Collection Time: 11/18/15 10:49 PM  Result Value Ref Range Status   Specimen Description BLOOD LEFT ARM  Final   Special Requests BOTTLES DRAWN AEROBIC AND ANAEROBIC 5CC  Final   Culture NO GROWTH 2 DAYS  Final   Report Status PENDING  Incomplete  CULTURE, BLOOD (ROUTINE X 2) w Reflex to ID Panel     Status: None (Preliminary result)   Collection Time: 11/18/15 10:49 PM  Result Value Ref Range Status   Specimen Description BLOOD LEFT HAND  Final   Special Requests BOTTLES DRAWN AEROBIC AND ANAEROBIC 5CC  Final   Culture NO GROWTH 2 DAYS  Final   Report Status PENDING  Incomplete    Medical History: Past Medical History:  Diagnosis Date  . Asthma   . Breast cancer ([place]) 2009   left  . Cancer ([place])   . CHF (congestive heart failure) ([place])   . Chronic UTI   . COPD (chronic obstructive pulmonary disease) ([place])   . Dizziness   . Fibromyalgia   . Hypertension   . Neuropathy ([place])   . Personal history of tobacco use, presenting hazards to health 05/17/2015  . Polyp, larynx   . RA (rheumatoid arthritis) ([place])   . Sinus infection    recent  . Stumbling gait    to the left  . Supplemental oxygen dependent    2.5l    Medications:  Scheduled:  . budesonide (PULMICORT) nebulizer solution  0.5 mg Nebulization BID  . carvedilol  6.25 mg Oral BID WC  . cefTRIAXone  1 g Intravenous Daily  . chlorpheniramine-HYDROcodone  5 mL Oral Q12H  . clonazePAM  1 mg Oral BID  . enoxaparin (LOVENOX) injection  40 mg Subcutaneous Q24H  . famotidine  20 mg Oral Daily  . heparin  5,000 Units Subcutaneous Q8H  . insulin aspart  0-9 Units Subcutaneous Q4H  . ipratropium-albuterol  3 mL Nebulization Q4H  . methylPREDNISolone (SOLU-MEDROL)  injection  125 mg Intravenous STAT  . methylPREDNISolone (SOLU-MEDROL) injection  40 mg Intravenous Q8H  . risperiDONE  1 mg Oral BID  . senna-docusate  2 tablet Oral BID  . sodium chloride flush  3 mL Intravenous Q12H     Darrow Bussing, PharmD Pharmacy Resident 11/20/2015 11:59 AM

## 2015-11-20 NOTE — Progress Notes (Signed)
eLink Physician-Brief Progress Note Patient Name: [name] [name] DOB: 07-26-1940 MRN: [id number]   Date of Service  11/20/2015  HPI/Events of Note  Camera check on patient with acute respiratory failure and delirium. Placed on BiPAP earlier. Has successfully weaned to FiO2 0.3. IPAP 12 & EPAP 6. Respiratory rate still at 30 breaths per minute. Bedside reports patient still not following commands. Opens eyes and appears to look into but does not track to voice.   eICU Interventions  Continue close monitoring in intensive care unit given the potential risk for endotracheal intubation      Intervention Category Major Interventions: Respiratory failure - evaluation and management;Delirium, psychosis, severe agitation - evaluation and management  Tera Partridge 11/20/2015, 12:24 AM

## 2015-11-20 NOTE — Progress Notes (Signed)
[facility] [place] NAME: [name] [name]    MR#:  [id number]  DATE OF BIRTH:  1940/12/06  SUBJECTIVE:  CHIEF COMPLAINT:   Chief Complaint  Patient presents with  . Shortness of Breath  . Altered Mental Status   -Transferred to [name] last evening as she went into flash pulmonary edema with acute hypoxic respiratory failure. Received BiPAP last night. -On 2 L oxygen this morning, doing very well. Some confusion present but very alert and interactive.  REVIEW OF SYSTEMS:  Review of Systems  Constitutional: Negative for chills, fever and malaise/fatigue.  HENT: Negative for ear discharge, ear pain, hearing loss and nosebleeds.   Eyes: Negative for blurred vision and double vision.  Respiratory: Positive for shortness of breath. Negative for cough and wheezing.   Cardiovascular: Negative for chest pain, palpitations and leg swelling.  Gastrointestinal: Negative for abdominal pain, constipation, diarrhea, nausea and vomiting.  Genitourinary: Negative for dysuria.  Musculoskeletal: Negative for myalgias.  Neurological: Negative for dizziness, sensory change, speech change, focal weakness, seizures and headaches.  Psychiatric/Behavioral: Negative for depression.    DRUG ALLERGIES:   Allergies  Allergen Reactions  . Contrast Media [Iodinated Diagnostic Agents] Other (See Comments)    Pt was sent to the ED following contrast media injection at [place]. Unknown reason. She has been premedicated since without complications. Pt to be premedicated prior to contrast media injections  . Ioxaglate Other (See Comments)    Pt was sent to the ED following contrast media injection at [place]. Unknown reason. She has been premedicated since without complications. Pt to be premedicated prior to contrast media injections  . Sulfa Antibiotics     Other reaction(s): Other (See Comments)  . Tetracycline Hives and Other (See Comments)  .  White Petrolatum Other (See Comments)  . Amoxicillin-Pot Clavulanate Rash    Blisters in mouth Other reaction(s): Unknown Blisters in mouth Blisters in mouth  . Tape Rash    VITALS:  Blood pressure 110/83, pulse (!) 105, temperature 98.9 F (37.2 C), temperature source Oral, resp. rate (!) 25, height '5\' 5"'$  (1.651 m), weight 51.5 kg (113 lb 8.6 oz), SpO2 98 %.  PHYSICAL EXAMINATION:  Physical Exam  GENERAL:  75 y.o.-year-old patient lying in the bed with no acute distress.  EYES: Pupils equal, round, reactive to light and accommodation. No scleral icterus. Extraocular muscles intact.  HEENT: Head atraumatic, normocephalic. Oropharynx and nasopharynx clear.  NECK:  Supple, no jugular venous distention. No thyroid enlargement, no tenderness.  LUNGS: Normal breath sounds bilaterally, scattered wheezing, no rales,rhonchi or crepitation. No use of accessory muscles of respiration. Diminished breath sounds at the bases CARDIOVASCULAR: S1, S2 normal. No murmurs, rubs, or gallops.  ABDOMEN: Soft, nontender, nondistended. Bowel sounds present. No organomegaly or mass.  EXTREMITIES: No pedal edema, cyanosis, or clubbing.  NEUROLOGIC: Cranial nerves II through XII are intact. Muscle strength 5/5 in all extremities. Sensation intact. Gait not checked.  moving all extremities fine. PSYCHIATRIC: The patient is alert and oriented 2, but following commands today SKIN: No obvious rash, lesion, or ulcer.    LABORATORY PANEL:   CBC  Recent Labs Lab 11/20/15 0401  WBC 10.0  HGB 12.1  HCT 35.0  PLT 269   ------------------------------------------------------------------------------------------------------------------  Chemistries   Recent Labs Lab 11/14/15 2131  11/20/15 0401  NA 127*  < > 140  K 3.5  < > 3.5  CL 88*  < > 95*  CO2 27  < >  35*  GLUCOSE 115*  < > 131*  BUN 13  < > 28*  CREATININE 2.23*  < > 1.25*  CALCIUM 9.6  < > 9.0  MG  --   < > 1.9  AST 18  --   --   ALT 9*   --   --   ALKPHOS 98  --   --   BILITOT 0.4  --   --   < > = values in this interval not displayed. ------------------------------------------------------------------------------------------------------------------  Cardiac Enzymes  Recent Labs Lab 11/20/15 0401  TROPONINI 0.31*   ------------------------------------------------------------------------------------------------------------------  RADIOLOGY:  Dg Chest Port 1 View  Result Date: 11/20/2015 CLINICAL DATA:  Acute respiratory failure. EXAM: PORTABLE CHEST 1 VIEW COMPARISON:  11/19/2015 FINDINGS: Right arm PICC line tip in the SVC region. Atherosclerotic calcifications at the aortic arch. Heart size is within normal limits. Coarse lung markings appear unchanged. There is no focal airspace disease. IMPRESSION: Chronic lung changes without acute findings. Aortic atherosclerosis. Electronically Signed   By: Markus Daft M.D.   On: 11/20/2015 09:18   Dg Chest Port 1 View  Result Date: 11/19/2015 CLINICAL DATA:  PICC line placement. EXAM: PORTABLE CHEST 1 VIEW COMPARISON:  11/19/2015 FINDINGS: Right PICC catheter placed with tip over the cavoatrial junction region. No pneumothorax. Shallow inspiration. Normal heart size and pulmonary vascularity. Probable emphysematous changes in the lungs. Diffuse interstitial pattern to the lungs could represent fibrosis, edema, or interstitial pneumonitis. No blunting of costophrenic angles. No pneumothorax. Calcification of the aorta. Degenerative changes in the spine and shoulders. IMPRESSION: Right PICC catheter tip over the cavoatrial junction. No pneumothorax. Diffuse interstitial pattern to the lungs is unchanged. Electronically Signed   By: Lucienne Capers M.D.   On: 11/19/2015 22:49   Dg Chest Port 1 View  Result Date: 11/19/2015 CLINICAL DATA:  Respiratory distress EXAM: PORTABLE CHEST 1 VIEW COMPARISON:  Chest radiograph dated 11/16/2015. CT chest dated 11/14/2015. FINDINGS: Chronic  interstitial markings/emphysematous changes. Superimposed increased interstitial markings throughout the lungs bilaterally, suggesting atypical/viral infection or less likely interstitial edema. No pleural effusion or pneumothorax. The heart is normal in size. IMPRESSION: Increased interstitial markings bilaterally, favoring atypical viral infection, less likely interstitial edema. Electronically Signed   By: Julian Hy M.D.   On: 11/19/2015 17:36    EKG:   Orders placed or performed during the hospital encounter of 11/14/15  . ED EKG 12-Lead  . ED EKG 12-Lead  . EKG 12-Lead  . EKG 12-Lead  . EKG 12-Lead  . EKG 12-Lead    ASSESSMENT AND PLAN:   [name] 59-year-old female with past medical history significant for COPD/asthma on home oxygen noncompliant, fibromyalgia, hypertension, neuropathy presented to hospital secondary to acute hypoxic respiratory failure  #1 acute hypoxic respiratory failure-secondary to COPD exacerbation and pneumonia on admission. -Went into flash pulmonary edema receiving Lasix and BiPAP yesterday. -Echocardiogram with EF 21% and diastolic dysfunction. Started low-dose Lasix from today -On admission, patient failed BiPAP requiring intubation and extubated about 4 days ago -Continue Rocephin and azithromycin for pneumonia. Repeat chest x-ray showing only atelectasis. -Continue steroids and inhaler/nebulizer for her COPD. -Encouraged to do incentive spirometry and physical therapy today  #2 acute delirium-after extubation. Improving slowly. -Added risperidone. Also on Klonopin. Continue Haldol as needed. No focal neuro deficits at this time. Discontinued sitter as patient is much more alert today.  #3 sepsis-secondary to pneumonia on admission. Resolved at this time.  #4 fibromyalgia-pain medications are on hold  #5 DVT prophylaxis-on subcutaneous heparin  Physical therapy consult today   All the records are reviewed and case discussed with Care  Management/Social Workerr. Management plans discussed with the patient, family and they are in agreement.  CODE STATUS: Full code  TOTAL TIME TAKING CARE OF THIS PATIENT: 37 minutes.   POSSIBLE D/C IN 1-2 DAYS, DEPENDING ON CLINICAL CONDITION.   Gladstone Lighter M.D on 11/20/2015 at 1:29 PM  Between 7am to 6pm - Pager - [phone]  After 6pm go to [email or web address] - password [email or web address] [facility]  Office  [phone]  CC: Primary care physician; Tracie Harrier, MD

## 2015-11-20 NOTE — Progress Notes (Signed)
Pt on BiPaP, not following commands at beginning of shift. Pt had critical Troponin levels, NP made aware, no new orders at this time. Pt taken off BiPaP this am, put on 2.5L [place], sat >95%, pt. Tolerating well. Will continue to monitor.

## 2015-11-21 LAB — URINALYSIS COMPLETE WITH MICROSCOPIC (ARMC ONLY)
Bilirubin Urine: NEGATIVE
Glucose, UA: NEGATIVE mg/dL
Hgb urine dipstick: NEGATIVE
Leukocytes, UA: NEGATIVE
Nitrite: NEGATIVE
PH: 6 (ref 5.0–8.0)
PROTEIN: NEGATIVE mg/dL
SQUAMOUS EPITHELIAL / LPF: NONE SEEN
Specific Gravity, Urine: 1.015 (ref 1.005–1.030)

## 2015-11-21 LAB — GLUCOSE, CAPILLARY
GLUCOSE-CAPILLARY: 164 mg/dL — AB (ref 65–99)
GLUCOSE-CAPILLARY: 144 mg/dL — AB (ref 65–99)
GLUCOSE-CAPILLARY: 174 mg/dL — AB (ref 65–99)
GLUCOSE-CAPILLARY: 149 mg/dL — AB (ref 65–99)
GLUCOSE-CAPILLARY: 119 mg/dL — AB (ref 65–99)

## 2015-11-21 MED ORDER — INSULIN ASPART 100 UNIT/ML ~~LOC~~ SOLN
0.0000 [IU] | Freq: Three times a day (TID) | SUBCUTANEOUS | Status: DC
  Administered 2015-11-21: 1 [IU] via SUBCUTANEOUS
  Administered 2015-11-21: 2 [IU] via SUBCUTANEOUS
  Administered 2015-11-22: 1 [IU] via SUBCUTANEOUS
  Administered 2015-11-22: 2 [IU] via SUBCUTANEOUS
  Administered 2015-11-23: 3 [IU] via SUBCUTANEOUS
  Administered 2015-11-23 – 2015-11-24 (×2): 1 [IU] via SUBCUTANEOUS
  Administered 2015-11-25 – 2015-11-26 (×2): 2 [IU] via SUBCUTANEOUS
  Administered 2015-11-29: 1 [IU] via SUBCUTANEOUS
  Filled 2015-11-21: qty 2
  Filled 2015-11-21 (×4): qty 1
  Filled 2015-11-21 (×2): qty 2
  Filled 2015-11-21: qty 3
  Filled 2015-11-21: qty 1
  Filled 2015-11-21: qty 2

## 2015-11-21 MED ORDER — BISACODYL 10 MG RE SUPP: 10.0000 mg | Freq: Every day | RECTAL | Status: DC | PRN

## 2015-11-21 MED ORDER — PREDNISONE 50 MG PO TABS
50.0000 mg | ORAL_TABLET | Freq: Every day | ORAL | Status: DC
  Administered 2015-11-22: 50 mg via ORAL
  Filled 2015-11-21: qty 1

## 2015-11-21 NOTE — Progress Notes (Signed)
[place] [facility] [place] NAME: [name] [name]    MR#:  [id number]  DATE OF BIRTH:  [birth year]  SUBJECTIVE:  CHIEF COMPLAINT:   Chief Complaint  Patient presents with  . Shortness of Breath  . Altered Mental Status   -Remains pleasantly confused. Not oriented to time or place. -Some visual hallucinations noted  REVIEW OF SYSTEMS:  Review of Systems  Constitutional: Positive for malaise/fatigue. Negative for chills and fever.  HENT: Negative for ear discharge, ear pain, hearing loss and nosebleeds.   Eyes: Negative for blurred vision and double vision.  Respiratory: Positive for shortness of breath. Negative for cough and wheezing.   Cardiovascular: Negative for chest pain, palpitations and leg swelling.  Gastrointestinal: Negative for abdominal pain, constipation, diarrhea, nausea and vomiting.  Genitourinary: Negative for dysuria.  Musculoskeletal: Negative for myalgias.  Neurological: Negative for dizziness, sensory change, speech change, focal weakness, seizures and headaches.  Psychiatric/Behavioral: Positive for hallucinations. Negative for depression.    DRUG ALLERGIES:   Allergies  Allergen Reactions  . Contrast Media [Iodinated Diagnostic Agents] Other (See Comments)    Pt was sent to the ED following contrast media injection at [place]. Unknown reason. She has been premedicated since without complications. Pt to be premedicated prior to contrast media injections  . Ioxaglate Other (See Comments)    Pt was sent to the ED following contrast media injection at [place]. Unknown reason. She has been premedicated since without complications. Pt to be premedicated prior to contrast media injections  . Sulfa Antibiotics     Other reaction(s): Other (See Comments)  . Tetracycline Hives and Other (See Comments)  . White Petrolatum Other (See Comments)  . Amoxicillin-Pot Clavulanate Rash    Blisters in mouth Other  reaction(s): Unknown Blisters in mouth Blisters in mouth  . Tape Rash    VITALS:  Blood pressure 116/67, pulse 63, temperature 97.8 F (36.6 C), temperature source Oral, resp. rate 16, height '5\' 5"'$  (1.651 m), weight 54.6 kg (120 lb 6.4 oz), SpO2 91 %.  PHYSICAL EXAMINATION:  Physical Exam  GENERAL:  75 y.o.-year-old patient lying in the bed with no acute distress.  EYES: Pupils equal, round, reactive to light and accommodation. No scleral icterus. Extraocular muscles intact.  HEENT: Head atraumatic, normocephalic. Oropharynx and nasopharynx clear.  NECK:  Supple, no jugular venous distention. No thyroid enlargement, no tenderness.  LUNGS: Normal breath sounds bilaterally, scattered wheezing, no rales,rhonchi or crepitation. No use of accessory muscles of respiration. Diminished breath sounds at the bases CARDIOVASCULAR: S1, S2 normal. No murmurs, rubs, or gallops.  ABDOMEN: Soft, nontender, nondistended. Bowel sounds present. No organomegaly or mass.  EXTREMITIES: No pedal edema, cyanosis, or clubbing.  NEUROLOGIC: Cranial nerves II through XII are intact. Muscle strength 5/5 in all extremities. Sensation intact. Gait not checked.  moving all extremities fine. PSYCHIATRIC: The patient is alert and oriented To self, but following commands. Occasional visual hallucinations noted. Talking to people who were not present in the home SKIN: No obvious rash, lesion, or ulcer.    LABORATORY PANEL:   CBC  Recent Labs Lab 11/20/15 0401  WBC 10.0  HGB 12.1  HCT 35.0  PLT 269   ------------------------------------------------------------------------------------------------------------------  Chemistries   Recent Labs Lab 11/14/15 2131  11/20/15 0401  NA 127*  < > 140  K 3.5  < > 3.5  CL 88*  < > 95*  CO2 27  < > 35*  GLUCOSE 115*  < >  131*  BUN 13  < > 28*  CREATININE 2.23*  < > 1.25*  CALCIUM 9.6  < > 9.0  MG  --   < > 1.9  AST 18  --   --   ALT 9*  --   --   ALKPHOS 98   --   --   BILITOT 0.4  --   --   < > = values in this interval not displayed. ------------------------------------------------------------------------------------------------------------------  Cardiac Enzymes  Recent Labs Lab 11/20/15 0401  TROPONINI 0.31*   ------------------------------------------------------------------------------------------------------------------  RADIOLOGY:  Dg Chest Port 1 View  Result Date: 11/20/2015 CLINICAL DATA:  Acute respiratory failure. EXAM: PORTABLE CHEST 1 VIEW COMPARISON:  11/19/2015 FINDINGS: Right arm PICC line tip in the SVC region. Atherosclerotic calcifications at the aortic arch. Heart size is within normal limits. Coarse lung markings appear unchanged. There is no focal airspace disease. IMPRESSION: Chronic lung changes without acute findings. Aortic atherosclerosis. Electronically Signed   By: Markus Daft M.D.   On: 11/20/2015 09:18   Dg Chest Port 1 View  Result Date: 11/19/2015 CLINICAL DATA:  PICC line placement. EXAM: PORTABLE CHEST 1 VIEW COMPARISON:  11/19/2015 FINDINGS: Right PICC catheter placed with tip over the cavoatrial junction region. No pneumothorax. Shallow inspiration. Normal heart size and pulmonary vascularity. Probable emphysematous changes in the lungs. Diffuse interstitial pattern to the lungs could represent fibrosis, edema, or interstitial pneumonitis. No blunting of costophrenic angles. No pneumothorax. Calcification of the aorta. Degenerative changes in the spine and shoulders. IMPRESSION: Right PICC catheter tip over the cavoatrial junction. No pneumothorax. Diffuse interstitial pattern to the lungs is unchanged. Electronically Signed   By: Lucienne Capers M.D.   On: 11/19/2015 22:49   Dg Chest Port 1 View  Result Date: 11/19/2015 CLINICAL DATA:  Respiratory distress EXAM: PORTABLE CHEST 1 VIEW COMPARISON:  Chest radiograph dated 11/16/2015. CT chest dated 11/14/2015. FINDINGS: Chronic interstitial  markings/emphysematous changes. Superimposed increased interstitial markings throughout the lungs bilaterally, suggesting atypical/viral infection or less likely interstitial edema. No pleural effusion or pneumothorax. The heart is normal in size. IMPRESSION: Increased interstitial markings bilaterally, favoring atypical viral infection, less likely interstitial edema. Electronically Signed   By: Julian Hy M.D.   On: 11/19/2015 17:36    EKG:   Orders placed or performed during the hospital encounter of 11/14/15  . ED EKG 12-Lead  . ED EKG 12-Lead  . EKG 12-Lead  . EKG 12-Lead  . EKG 12-Lead  . EKG 12-Lead    ASSESSMENT AND PLAN:   [name] 69-year-old female with past medical history significant for COPD/asthma on home oxygen noncompliant, fibromyalgia, hypertension, neuropathy presented to hospital secondary to acute hypoxic respiratory failure  #1 acute hypoxic respiratory failure-secondary to COPD exacerbation and pneumonia on admission. -Went into flash pulmonary edema receiving Lasix and BiPAP on 11/19/15. -Echocardiogram with EF 03% and diastolic dysfunction. Started low-dose Lasix from today -On admission, patient failed BiPAP requiring intubation and extubated a fewdays ago -Continue Rocephin and azithromycin for pneumonia. Repeat chest x-ray showing only atelectasis. -Continue steroids and inhaler/nebulizer for her COPD. -Encouraged to do incentive spirometry and physical therapy   #2 acute delirium-after extubation. Improving slowly. -Added risperidone. Also on Klonopin. Continue Haldol as needed. No focal neuro deficits at this time. -Decrease steroids dose Discontinued sitter.  #3 sepsis-secondary to pneumonia on admission. Resolved at this time.  #4 fibromyalgia-pain medications are on hold  #5 DVT prophylaxis-on subcutaneous Lovenox   Physical therapy consulted. Discharge once mental status is improved  All the records are reviewed and case discussed with  Care Management/Social Workerr. Management plans discussed with the patient, family and they are in agreement.  CODE STATUS: Full code  TOTAL TIME TAKING CARE OF THIS PATIENT: 37 minutes.   POSSIBLE D/C IN 1-2 DAYS, DEPENDING ON CLINICAL CONDITION.   Gladstone Lighter M.D on 11/21/2015 at 3:06 PM  Between 7am to 6pm - Pager - [phone]  After 6pm go to [email or web address] - password [email or web address] [facility]  Office  [phone]  CC: Primary care physician; Tracie Harrier, MD

## 2015-11-21 NOTE — Therapy (Signed)
Attempted to administer HHN to patient via mask. Patient pulling at mask/neb stating "I'm too sick to breathe right now". Attempts to educate failed. Patient given remainder of neb via blow-by. Patient is also talking to people who are not in the room.

## 2015-11-21 NOTE — Evaluation (Signed)
Physical Therapy Evaluation Patient Details Name: [name] [name] MRN: [id number] DOB: 01-12-1941 Today's Date: 11/21/2015   History of Present Illness  presented to ER secondary to AMS, SOB; admitted with sepsis related to CAP/COPD exacerbation.  Initially requiring BiPAP, subsequently intubated 11/2-11/4.  Hospital course additionally significant for intermittent agitation and return transfer to CCU 11/6 due to flash pulmonary edema; now returned to med-surg unit and on RA.  Maintaining sats >90% throughout session.  Clinical Impression  Upon evaluation, patient alert and oriented to self only; inconsistently follows single-step commands.  Generally confused and disoriented; frequently calling out/talking to family members not in room.  Globally weak and deconditioned, requiring mod assist from therapist for all mobility.  Required extensive encouragement for all functional activities; refused activities beyond bed/chair. Sats >90% on RA throughout session. Would benefit from skilled PT to address above deficits and promote optimal return to PLOF; recommend transition to STR upon discharge from acute hospitalization.     Follow Up Recommendations SNF    Equipment Recommendations       Recommendations for Other Services       Precautions / Restrictions Precautions Precautions: Fall Restrictions Weight Bearing Restrictions: No      Mobility  Bed Mobility Overal bed mobility: Needs Assistance Bed Mobility: Supine to Sit     Supine to sit: Min assist;Mod assist     General bed mobility comments: heavy assist to initiate movement transition; frequently attempting spontaneous return to supine  Transfers Overall transfer level: Needs assistance Equipment used: Rolling walker (2 wheeled) Transfers: Sit to/from Stand Sit to Stand: Mod assist            Ambulation/Gait Ambulation/Gait assistance: Mod assist Ambulation Distance (Feet): 5 Feet Assistive device: Rolling  walker (2 wheeled)       General Gait Details: broad BOS, staggering and unsteady; poor balance, poor endurance.  Unsafe to attempt without RW and +1; refused futher distance despite encouragement  Stairs            Wheelchair Mobility    Modified Rankin (Stroke Patients Only)       Balance Overall balance assessment: Needs assistance Sitting-balance support: No upper extremity supported;Feet supported Sitting balance-Leahy Scale: Fair     Standing balance support: Bilateral upper extremity supported Standing balance-Leahy Scale: Poor                               Pertinent Vitals/Pain Pain Assessment: Faces Faces Pain Scale: Hurts little more Pain Location: bilat hands Pain Descriptors / Indicators: Grimacing Pain Intervention(s): Limited activity within patient's tolerance;Monitored during session;Repositioned    Home Living                   Additional Comments: Patient significantly confused; unable to provide accurate social history or PLOF.  Will verify with family as available.    Prior Function           Comments: Patient significantly confused; unable to provide accurate social history or PLOF.  Will verify with family as available.     Hand Dominance        Extremity/Trunk Assessment   Upper Extremity Assessment: Generalized weakness           Lower Extremity Assessment: Generalized weakness (grossly at least 3-/5 throughout; difficult to formally assess due to cognitive deficits)         Communication   Communication: No difficulties  Cognition Arousal/Alertness: Awake/alert Behavior During  Therapy: Flat affect Overall Cognitive Status: Impaired/Different from baseline (oriented to self only; constant cuing for redirection/comprehension of task.  Patient frequently calling to family members not present in room/hospital; globally confused.)                      General Comments      Exercises      Assessment/Plan    PT Assessment Patient needs continued PT services  PT Problem List Decreased strength;Decreased range of motion;Decreased activity tolerance;Decreased balance;Decreased mobility;Decreased coordination;Decreased cognition;Decreased knowledge of use of DME;Decreased safety awareness;Decreased knowledge of precautions;Cardiopulmonary status limiting activity          PT Treatment Interventions DME instruction;Gait training;Stair training;Therapeutic activities;Functional mobility training;Therapeutic exercise;Balance training;Patient/family education    PT Goals (Current goals can be found in the Care Plan section)  Acute Rehab PT Goals PT Goal Formulation: Patient unable to participate in goal setting Time For Goal Achievement: 12/05/15 Potential to Achieve Goals: Fair    Frequency Min 2X/week   Barriers to discharge Decreased caregiver support      Co-evaluation               End of Session Equipment Utilized During Treatment: Gait belt Activity Tolerance: Patient limited by lethargy (limited by confusion) Patient left: in chair;with call bell/phone within reach;with chair alarm set Nurse Communication: Mobility status         Time: 9432-7614 PT Time Calculation (min) (ACUTE ONLY): 21 min   Charges:   PT Evaluation $PT Eval Moderate Complexity: 1 Procedure     PT G Codes:       Juliya Magill H. Owens Shark, PT, DPT, NCS 11/21/15, 8:26 PM [phone]

## 2015-11-21 NOTE — Progress Notes (Signed)
[place] making rounds visited the Pt. CH had seen the Pt before in the [name], this was a follow-up visit with her. Pt told Elizabeth that she was doing much better than before and that she has been in hospital 6 days. Pt appeared to be relaxed and hopeful. [place] reminded Pt that he was available.     11/21/15 1600  Clinical Encounter Type  Visited With Patient  Visit Type Follow-up;Spiritual support  Stress Factors  Patient Stress Factors Other (Comment)  Family Stress Factors None identified

## 2015-11-22 ENCOUNTER — Inpatient Hospital Stay: Payer: Commercial Managed Care - HMO

## 2015-11-22 DIAGNOSIS — I5022 Chronic systolic (congestive) heart failure: Secondary | ICD-10-CM

## 2015-11-22 LAB — VITAMIN B12: VITAMIN B 12: 535 pg/mL (ref 180–914)

## 2015-11-22 LAB — BASIC METABOLIC PANEL
Anion gap: 9 (ref 5–15)
BUN: 29 mg/dL — AB (ref 6–20)
CHLORIDE: 96 mmol/L — AB (ref 101–111)
CO2: 35 mmol/L — AB (ref 22–32)
CREATININE: 1.27 mg/dL — AB (ref 0.44–1.00)
Calcium: 8.9 mg/dL (ref 8.9–10.3)
GFR calc Af Amer: 47 mL/min — ABNORMAL LOW (ref >60)
GFR calc non Af Amer: 41 mL/min — ABNORMAL LOW (ref >60)
Glucose, Bld: 110 mg/dL — ABNORMAL HIGH (ref 65–99)
POTASSIUM: 3.1 mmol/L — AB (ref 3.5–5.1)
Sodium: 140 mmol/L (ref 135–145)

## 2015-11-22 LAB — TSH: TSH: 0.873 u[IU]/mL (ref 0.350–4.500)

## 2015-11-22 LAB — GLUCOSE, CAPILLARY
GLUCOSE-CAPILLARY: 117 mg/dL — AB (ref 65–99)
GLUCOSE-CAPILLARY: 177 mg/dL — AB (ref 65–99)
GLUCOSE-CAPILLARY: 141 mg/dL — AB (ref 65–99)
Glucose-Capillary: 142 mg/dL — ABNORMAL HIGH (ref 65–99)

## 2015-11-22 LAB — AMMONIA: AMMONIA: 10 umol/L (ref 9–35)

## 2015-11-22 MED ORDER — POLYETHYLENE GLYCOL 3350 17 G PO PACK
17.0000 g | PACK | Freq: Every day | ORAL | Status: DC
  Administered 2015-11-22 – 2015-11-29 (×7): 17 g via ORAL
  Filled 2015-11-22 (×7): qty 1

## 2015-11-22 MED ORDER — CARVEDILOL 12.5 MG PO TABS
12.5000 mg | ORAL_TABLET | Freq: Two times a day (BID) | ORAL | Status: DC
  Administered 2015-11-22 – 2015-11-29 (×10): 12.5 mg via ORAL
  Filled 2015-11-22 (×12): qty 1

## 2015-11-22 MED ORDER — PREDNISONE 20 MG PO TABS
40.0000 mg | ORAL_TABLET | Freq: Every day | ORAL | Status: DC
  Administered 2015-11-23: 40 mg via ORAL
  Filled 2015-11-22: qty 2

## 2015-11-22 MED ORDER — POTASSIUM CHLORIDE CRYS ER 20 MEQ PO TBCR
40.0000 meq | EXTENDED_RELEASE_TABLET | ORAL | Status: AC
  Administered 2015-11-22 (×2): 40 meq via ORAL
  Filled 2015-11-22 (×2): qty 2

## 2015-11-22 MED ORDER — RISPERIDONE 1 MG PO TBDP
1.0000 mg | ORAL_TABLET | Freq: Every day | ORAL | Status: DC
  Administered 2015-11-22 – 2015-11-28 (×6): 1 mg via ORAL
  Filled 2015-11-22 (×6): qty 1

## 2015-11-22 MED ORDER — RISPERIDONE 1 MG PO TBDP: 1.0000 mg | ORAL_TABLET | Freq: Every day | ORAL | Status: DC

## 2015-11-22 NOTE — Progress Notes (Addendum)
[place] [facility] [place] NAME: [name] [name]    MR#:  [id number]  DATE OF BIRTH:  August 12, 1940  SUBJECTIVE:  CHIEF COMPLAINT:   Chief Complaint  Patient presents with  . Shortness of Breath  . Altered Mental Status   -Pleasantly confused. Fluctuating in and out with her mental status. No periods of agitation. -Steroids are being tapered  REVIEW OF SYSTEMS:  Review of Systems  Constitutional: Positive for malaise/fatigue. Negative for chills and fever.  HENT: Negative for ear discharge, ear pain, hearing loss and nosebleeds.   Eyes: Negative for blurred vision and double vision.  Respiratory: Positive for shortness of breath. Negative for cough and wheezing.   Cardiovascular: Negative for chest pain, palpitations and leg swelling.  Gastrointestinal: Negative for abdominal pain, constipation, diarrhea, nausea and vomiting.  Genitourinary: Negative for dysuria.  Musculoskeletal: Negative for myalgias.  Neurological: Negative for dizziness, sensory change, speech change, focal weakness, seizures and headaches.  Psychiatric/Behavioral: Positive for hallucinations. Negative for depression.    DRUG ALLERGIES:   Allergies  Allergen Reactions  . Contrast Media [Iodinated Diagnostic Agents] Other (See Comments)    Pt was sent to the ED following contrast media injection at [place]. Unknown reason. She has been premedicated since without complications. Pt to be premedicated prior to contrast media injections  . Ioxaglate Other (See Comments)    Pt was sent to the ED following contrast media injection at [place]. Unknown reason. She has been premedicated since without complications. Pt to be premedicated prior to contrast media injections  . Sulfa Antibiotics     Other reaction(s): Other (See Comments)  . Tetracycline Hives and Other (See Comments)  . White Petrolatum Other (See Comments)  . Amoxicillin-Pot Clavulanate Rash      Blisters in mouth Other reaction(s): Unknown Blisters in mouth Blisters in mouth  . Tape Rash    VITALS:  Blood pressure 134/66, pulse (!) 112, temperature 98.5 F (36.9 C), temperature source Oral, resp. rate 18, height '5\' 5"'$  (1.651 m), weight 51.3 kg (113 lb 3.2 oz), SpO2 95 %.  PHYSICAL EXAMINATION:  Physical Exam  GENERAL:  75 y.o.-year-old patient lying in the bed with no acute distress.  EYES: Pupils equal, round, reactive to light and accommodation. No scleral icterus. Extraocular muscles intact.  HEENT: Head atraumatic, normocephalic. Oropharynx and nasopharynx clear.  NECK:  Supple, no jugular venous distention. No thyroid enlargement, no tenderness.  LUNGS: Normal breath sounds bilaterally, scattered wheezing, no rales,rhonchi or crepitation. No use of accessory muscles of respiration. Diminished breath sounds at the bases CARDIOVASCULAR: S1, S2 normal. No murmurs, rubs, or gallops.  ABDOMEN: Soft, nontender, nondistended. Bowel sounds present. No organomegaly or mass.  EXTREMITIES: No pedal edema, cyanosis, or clubbing.  NEUROLOGIC: Cranial nerves II through XII are intact. Muscle strength 5/5 in all extremities. Sensation intact. Gait not checked.  moving all extremities fine. PSYCHIATRIC: The patient is alert and oriented x2, and following commands. Occasional visual hallucinations noted. Occasional flight of thoughts. Talking to people who were not present in the home SKIN: No obvious rash, lesion, or ulcer.    LABORATORY PANEL:   CBC  Recent Labs Lab 11/20/15 0401  WBC 10.0  HGB 12.1  HCT 35.0  PLT 269   ------------------------------------------------------------------------------------------------------------------  Chemistries   Recent Labs Lab 11/20/15 0401 11/22/15 0421  NA 140 140  K 3.5 3.1*  CL 95* 96*  CO2 35* 35*  GLUCOSE 131* 110*  BUN 28* 29*  CREATININE 1.25* 1.27*  CALCIUM 9.0 8.9  MG 1.9  --     ------------------------------------------------------------------------------------------------------------------  Cardiac Enzymes  Recent Labs Lab 11/20/15 0401  TROPONINI 0.31*   ------------------------------------------------------------------------------------------------------------------  RADIOLOGY:  No results found.  EKG:   Orders placed or performed during the hospital encounter of 11/14/15  . ED EKG 12-Lead  . ED EKG 12-Lead  . EKG 12-Lead  . EKG 12-Lead  . EKG 12-Lead  . EKG 12-Lead    ASSESSMENT AND PLAN:   [name] [name] 69-year-old female with past medical history significant for COPD/asthma on home oxygen noncompliant, fibromyalgia, hypertension, neuropathy presented to hospital secondary to acute hypoxic respiratory failure  #1 acute hypoxic respiratory failure-secondary to COPD exacerbation and pneumonia on admission. -Went into flash pulmonary edema receiving Lasix and BiPAP on 11/19/15. -Echocardiogram with EF 10% and diastolic dysfunction. Lasix discontinued today. -On admission, patient failed BiPAP requiring intubation and extubated a fewdays ago -Continue Rocephin and azithromycin for pneumonia. Stop after 7 days. Repeat chest x-ray showing only atelectasis. -Continue steroids - on prednisone oral taper. inhaler/nebulizer for her COPD. -Encouraged to do incentive spirometry and physical therapy   #2 acute delirium-after extubation. Improving slowly. -Added risperidone. Decrease the dose to bedtime only. Chronically on Klonopin. No focal neuro deficits at this time. -Decreased steroids dose. CT of the head is ordered. Ammonia, b12, tsh ordered Discontinued sitter.  #3 sepsis-secondary to pneumonia on admission. Resolved at this time.  #4 Hypokalemia-being replaced.  #5 DVT prophylaxis-on subcutaneous Lovenox   Physical therapy consulted. Discharge once mental status is improved PT to follow up again today Son and grandson updated at  bedside   All the records are reviewed and case discussed with Care Management/Social Workerr. Management plans discussed with the patient, family and they are in agreement.  CODE STATUS: Full code  TOTAL TIME TAKING CARE OF THIS PATIENT: 37 minutes.   POSSIBLE D/C IN 1-2 DAYS, DEPENDING ON CLINICAL CONDITION.   Gladstone Lighter M.D on 11/22/2015 at 1:58 PM  Between 7am to 6pm - Pager - [phone]  After 6pm go to [email or web address] - password [email or web address] [facility]  Office  [phone]  CC: Primary care physician; Tracie Harrier, MD

## 2015-11-22 NOTE — NC FL2 (Signed)
[place] LEVEL OF CARE SCREENING TOOL     IDENTIFICATION  Patient Name: [name] [name] Birthdate: [birth year] Sex: female Admission Date (Current Location): 11/14/2015  [place] and [place] Number:  Engineering geologist and Address:  [facility], [address], [place], [place] [address]      Provider Number: [id number]  Attending Physician Name and Address:  Gladstone Lighter, MD  Relative Name and Phone Number:       Current Level of Care: Hospital Recommended Level of Care: [place] Prior Approval Number:    Date Approved/Denied:   PASRR Number: [id number]  Discharge Plan: SNF    Current Diagnoses: Patient Active Problem List   Diagnosis Date Noted  . Acute respiratory failure ([place])   . Hyponatremia   . CAP (community acquired pneumonia) 11/15/2015  . Hypotension 11/15/2015  . AKI (acute kidney injury) ([place]) 11/15/2015  . COPD (chronic obstructive pulmonary disease) ([place]) 11/15/2015  . Chronic systolic CHF (congestive heart failure) ([place]) 11/15/2015  . Sepsis ([place]) 11/15/2015  . Altered mental status   . Personal history of tobacco use, presenting hazards to health 05/17/2015    Orientation RESPIRATION BLADDER Height & Weight     Self, Place  Normal Incontinent Weight: 113 lb 3.2 oz (51.3 kg) Height:  '5\' 5"'$  (165.1 cm) (estimated)  BEHAVIORAL SYMPTOMS/MOOD NEUROLOGICAL BOWEL NUTRITION STATUS   (none)  (none) Continent Diet (dysphagia 3)  AMBULATORY STATUS COMMUNICATION OF NEEDS Skin   Extensive Assist Verbally Normal                       Personal Care Assistance Level of Assistance  Bathing, Dressing, Feeding Bathing Assistance: Limited assistance Feeding assistance: Limited assistance Dressing Assistance: Limited assistance     Functional Limitations Info             [place]  PT (By licensed PT)                    Contractures Contractures Info: Not  present    Additional Factors Info  Code Status, Allergies Code Status Info: full Allergies Info: contrast media; ioxoglate; sulfa antibiotics; tetracycline; white petrolatum; augmentin           Current Medications (11/22/2015):  This is the current hospital active medication list Current Facility-Administered Medications  Medication Dose Route Frequency Provider Last Rate Last Dose  . acetaminophen (TYLENOL) tablet 650 mg  650 mg Per Tube Q6H PRN Wilhelmina Mcardle, MD   650 mg at 11/18/15 2109   Or  . acetaminophen (TYLENOL) suppository 650 mg  650 mg Rectal Q6H PRN Wilhelmina Mcardle, MD      . albuterol (PROVENTIL) (2.5 MG/3ML) 0.083% nebulizer solution 2.5 mg  2.5 mg Nebulization Q3H PRN Wilhelmina Mcardle, MD      . bisacodyl (DULCOLAX) suppository 10 mg  10 mg Rectal Daily PRN Gladstone Lighter, MD      . budesonide (PULMICORT) nebulizer solution 0.5 mg  0.5 mg Nebulization BID Napoleon Form, RPH   0.5 mg at 11/21/15 0734  . carvedilol (COREG) tablet 12.5 mg  12.5 mg Oral BID WC Gladstone Lighter, MD   12.5 mg at 11/22/15 1009  . chlorpheniramine-HYDROcodone (TUSSIONEX) 10-8 MG/5ML suspension 5 mL  5 mL Oral Q12H Gladstone Lighter, MD   5 mL at 11/22/15 1017  . clonazePAM (KLONOPIN) tablet 1 mg  1 mg Oral BID Bincy Jannetta Quint, NP   1  mg at 11/22/15 1010  . enoxaparin (LOVENOX) injection 40 mg  40 mg Subcutaneous Q24H Vishal Mungal, MD   40 mg at 11/21/15 2216  . famotidine (PEPCID) tablet 20 mg  20 mg Oral Daily Sheema M Hallaji, RPH   20 mg at 11/21/15 1145  . feeding supplement (ENSURE ENLIVE) (ENSURE ENLIVE) liquid 237 mL  237 mL Oral BID BM Gladstone Lighter, MD   237 mL at 11/22/15 1016  . insulin aspart (novoLOG) injection 0-9 Units  0-9 Units Subcutaneous TID WC Gladstone Lighter, MD   1 Units at 11/21/15 1714  . ipratropium-albuterol (DUONEB) 0.5-2.5 (3) MG/3ML nebulizer solution 3 mL  3 mL Nebulization Q4H Flora Lipps, MD   3 mL at 11/21/15 1606  . LORazepam (ATIVAN) injection  0.5 mg  0.5 mg Intravenous Q6H PRN Bincy S Varughese, NP   0.5 mg at 11/17/15 1201  . ondansetron (ZOFRAN) tablet 4 mg  4 mg Oral Q6H PRN Lance Coon, MD       Or  . ondansetron [facility]) injection 4 mg  4 mg Intravenous Q6H PRN Lance Coon, MD   4 mg at 11/17/15 1023  . polyethylene glycol (MIRALAX / GLYCOLAX) packet 17 g  17 g Oral Daily PRN Gladstone Lighter, MD      . potassium chloride SA (K-DUR,KLOR-CON) CR tablet 40 mEq  40 mEq Oral Q4H Gladstone Lighter, MD   40 mEq at 11/22/15 1011  . predniSONE (DELTASONE) tablet 50 mg  50 mg Oral Q breakfast Gladstone Lighter, MD   50 mg at 11/22/15 1010  . risperiDONE (RISPERDAL M-TABS) disintegrating tablet 1 mg  1 mg Oral BID Gladstone Lighter, MD   1 mg at 11/22/15 1010  . senna-docusate (Senokot-S) tablet 2 tablet  2 tablet Oral BID Gladstone Lighter, MD   2 tablet at 11/22/15 1010  . sodium chloride flush (NS) 0.9 % injection 3 mL  3 mL Intravenous Q12H Gladstone Lighter, MD   3 mL at 11/22/15 1017  . sodium chloride flush (NS) 0.9 % injection 3 mL  3 mL Intravenous PRN Gladstone Lighter, MD   3 mL at 11/18/15 1227     Discharge Medications: Please see discharge summary for a list of discharge medications.  Relevant Imaging Results:  Relevant Lab Results:   Additional Information ss: [id number]  Shela Leff, LCSW

## 2015-11-22 NOTE — Progress Notes (Signed)
Pt was attempting to get oob without assistance. RN helped pt to toilet with walker. Pt then got off of toilet and was trying to sit down in mid air. RN had to call for assistance to help get pt back in the bed. PT was not able to understand that she could not sit down in mid air.

## 2015-11-22 NOTE — Progress Notes (Signed)
MD paged regarding no BM documented since admission. Orders given for daily miralax.

## 2015-11-22 NOTE — Progress Notes (Signed)
PULMONARY / CRITICAL CARE MEDICINE   Name: [name] [name] MRN: [id number] DOB: 06/13/1940    ADMISSION DATE:  11/14/2015 CONSULTATION DATE:  11/15/15  REFERRING MD:  Dr Jannifer Franklin  CHIEF COMPLAINT: Acute Shortness of breath  Patient Profile Pt admitted 11/1 due to c/o shortness of breath and altered mental status post fall CT of chest on admission revealed PNA. She became hypoxic with worsening shortness of breath in the ER requiring Bipap, however symptoms did not improve she was subsequently intubated on 11/2>11/4, transferred to the floor, then returned to [name] with bipap on 11/6, as of 11/9 weaned down to RA and back to baseline breathing, with mild-mod confusion. [name]   SIGNIFICANT EVENTS: 11/1  Patient admited to [name] with acute respiratory failure due to PNA, requiring BiPAP 11/2 Intubated and mechanically ventillated 11/4 Extubated 11/5 Transferred out of [name] 11/6 Rapid response called pt in acute respiratory failure therefore transferred back to [name] requiring Bipap 11/7 weaned off BiPAP, back to baseline breathing, more alert and comfortable 11/9 on medsurg unit, now on RA, still with mild-mod confusion, but improving.   SUBJECTIVE:  Patient doing well from a respiratory standpoint this morning, still with some intermittent confusion, and mild visual hallucinations, but slowly improving. No complaints this morning, except requesting coughing  VITAL SIGNS: BP 123/68 (BP Location: Right Arm)   Pulse (!) 111   Temp 98 F (36.7 C) (Axillary)   Resp (!) 22   Ht '5\' 5"'$  (1.651 m) Comment: estimated  Wt 113 lb 3.2 oz (51.3 kg)   SpO2 94%   BMI 18.84 kg/m   HEMODYNAMICS:   INTAKE / OUTPUT: I/O last 3 completed shifts: In: 230 [P.O.:180; IV Piggyback:50] Out: 500 [Urine:500]  PHYSICAL EXAMINATION: General:  Acutely ill caucasian female, well developed, well nourished Neuro: opens eyes to voice, follows simple commands, PERRLA HEENT:  Atraumatic, normocephalic, no discharge, no  JVD appreciated Cardiovascular:  s1s2,sinus tach, no M/R/G noted Lungs: Improved breath sounds throughout all lung fields, good airway movement, mild decreased breath sounds at the bases, but improving Abdomen: +BS x4, soft and non tender, non distended Musculoskeletal:  No inflammation/deformity noted Skin:  Grossly intact no rashes or lesions  STUDIES:  11/14/15 CT head>> Negative for any intracranial abnormality 11/14/15 CT chest>>Mild to moderate diffuse emphysematous changes bilaterally.Interim finding of scattered areas of mild ground-glass densitywithin the bilateral upper lobes suggesting foci of pneumonitis.Soft tissue thickening around the central airways with central airspace disease, suggests central airways inflammation and possible pneumonia. No effusion. 11/2>>EF 55% to 60%  CULTURES: 11/1 BC>>negative 11/6 11/1SC>>normal flora 11/4  11/1 UC>>negative 11/3 11/5 Blood x2>>negative  ANTIBIOTICS: 11/2 Ceftriaxone>> 11/2 Azithromycin>>11/6  LINES/TUBES: Right upper arm PICC 11/6>>  ASSESSMENT / PLAN: Discussion: 75 year old female admitted for acute shortness of breath after a fall, noted to have pneumonia, intermittently intubated, now on room air with delirium/confusion but stable from a respiratory standpoint.  PULMONARY A: Acute Hypoxic Respiratory Failure secondary to Pulmonary Edema  Community Acquired PNA-improving Hx: COPD, Chronic O2 @ 2.5L, Asthma, and Tobacco Abuse P:   Supplemental oxygen as needed, continue with incentive spirometer Maintain O2 sats 88% to 92% Continue bronchodilators Wean steroids Pulmonary Hygiene  CARDIOVASCULAR A:  Septic shock-resolved Congestive Heart Failure Hx: HTN P:  Continuous telemetry monitoring Maintain map >65  RENAL A:   Acute kidney injury related to septic shock-resolving  Hyponatremia-resolved Metabolic alkalosis -resolved P:   Trend BMP's Monitor UOP  GASTROINTESTINAL A:   No acute  issues P: Continue current diet  as indicated Continue Pepcid  HEMATOLOGIC A:   No active issues P:  Subq heparin for VTE prophylaxis Trend CBC Monitor for s/sx of bleeding Transfuse for hgb <7  INFECTIOUS A:   CAP-resolving P:   Trend WBC's and monitor fever curve Continue abx as listed above Follow cultures  ENDOCRINE A:   Hypoglycemia-resolved P:   CBG's q4hrs  SSI  NEUROLOGIC A:   Acute encephalopathy related to sepsis-resolved Delirium with agitation-improving  P:   Sitter at bedside for pt safety Minimize sedating medication use Wean steroids Lights on during the day Frequent reorientation  Patient will continue to follow up with Dr. Ginette Pitman after discharge.   Thank you for consulting [facility], we will signoff at this time.  Please feel free to contact us with any questions at [phone] (please enter 7-digits).   Pulmonary Care Time devoted to patient care services described in this note is  35 Minutes.   Vilinda Boehringer, MD [facility] Pager [phone] (please enter 7-digits) On Call Pager [phone] (please enter 7-digits)  Note: This note was prepared with Dragon dictation along with smaller phrase technology. Any transcriptional errors that result from this process are unintentional.

## 2015-11-22 NOTE — Clinical Social Work Note (Signed)
Clinical Social Work Assessment  Patient Details  Name: [name] [name] MRN: [id number] Date of Birth: 05/04/1940  Date of referral:  11/22/15               Reason for consult:  Facility Placement                Permission sought to share information with:  Chartered certified accountant granted to share information::  Yes, Hospital doctor (verbal permission by son)  Name::        Agency::     Relationship::     Contact Information:     Housing/Transportation Living arrangements for the past 2 months:  [place] of Information:  Adult Children Patient Interpreter Needed:  None Criminal Activity/Legal Involvement Pertinent to Current Situation/Hospitalization:  No - Comment as needed Significant Relationships:  Adult Children Lives with:  Adult Children Do you feel safe going back to the place where you live?  Yes Need for family participation in patient care:  Yes (Comment)  Care giving concerns:  Patient resides with her son.    Social Worker assessment / plan:  CSW informed by PT that they are recommending STR. PT reported that patient was very confused and grabbing at things that were not there. CSW spoke with patient's son this afternoon, Mr. Straub: [phone] and he stated he would like to think about rehab and is not sure that is the plan he wants to go with. He stated that if patient's mental status clears up, he believes her mobility would be better. He did give CSW permission to begin a bedsearch in the event he decides for rehab. Employment status:  Retired Nurse, adult PT Recommendations:  [place] / Referral to community resources:     Patient/Family's Response to care:  [name] expressed appreciation for CSW assistance.   Patient/Family's Understanding of and Emotional Response to Diagnosis, Current Treatment, and Prognosis:  Patient currently is not oriented  and patient's son is aware of discharge planning options and is going to think about what he wishes for his mom.  Emotional Assessment Appearance:  Appears stated age Attitude/Demeanor/Rapport:  Unable to Assess Affect (typically observed):  Unable to Assess Orientation:  Oriented to Self Alcohol / Substance use:  Not Applicable Psych involvement (Current and /or in the community):  No (Comment)  Discharge Needs  Concerns to be addressed:  Care Coordination Readmission within the last 30 days:  No Current discharge risk:  None Barriers to Discharge:  No Barriers Identified   [name] Leff, LCSW 11/22/2015, 2:44 PM

## 2015-11-23 LAB — BASIC METABOLIC PANEL
ANION GAP: 9 (ref 5–15)
BUN: 31 mg/dL — ABNORMAL HIGH (ref 6–20)
CHLORIDE: 104 mmol/L (ref 101–111)
CO2: 31 mmol/L (ref 22–32)
Calcium: 9.1 mg/dL (ref 8.9–10.3)
Creatinine, Ser: 1.27 mg/dL — ABNORMAL HIGH (ref 0.44–1.00)
GFR calc non Af Amer: 41 mL/min — ABNORMAL LOW (ref >60)
GFR, EST AFRICAN AMERICAN: 47 mL/min — AB (ref >60)
Glucose, Bld: 112 mg/dL — ABNORMAL HIGH (ref 65–99)
Potassium: 4.4 mmol/L (ref 3.5–5.1)
Sodium: 144 mmol/L (ref 135–145)

## 2015-11-23 LAB — GLUCOSE, CAPILLARY
GLUCOSE-CAPILLARY: 141 mg/dL — AB (ref 65–99)
Glucose-Capillary: 120 mg/dL — ABNORMAL HIGH (ref 65–99)
Glucose-Capillary: 243 mg/dL — ABNORMAL HIGH (ref 65–99)
Glucose-Capillary: 122 mg/dL — ABNORMAL HIGH (ref 65–99)

## 2015-11-23 LAB — CULTURE, BLOOD (ROUTINE X 2)
CULTURE: NO GROWTH
Culture: NO GROWTH

## 2015-11-23 MED ORDER — HALOPERIDOL LACTATE 5 MG/ML IJ SOLN
0.5000 mg | Freq: Four times a day (QID) | INTRAMUSCULAR | Status: DC | PRN
  Administered 2015-11-23 – 2015-11-28 (×7): 0.5 mg via INTRAVENOUS
  Filled 2015-11-23 (×8): qty 1

## 2015-11-23 MED ORDER — HALOPERIDOL LACTATE 5 MG/ML IJ SOLN
1.0000 mg | Freq: Four times a day (QID) | INTRAMUSCULAR | Status: DC | PRN
  Administered 2015-11-23: 1 mg via INTRAVENOUS
  Filled 2015-11-23 (×2): qty 1

## 2015-11-23 MED ORDER — PHENOL 1.4 % MT LIQD
1.0000 | OROMUCOSAL | Status: DC | PRN
  Filled 2015-11-23: qty 177

## 2015-11-23 MED ORDER — ENSURE ENLIVE PO LIQD
237.0000 mL | Freq: Every day | ORAL | Status: DC
  Administered 2015-11-26 – 2015-11-27 (×2): 237 mL via ORAL

## 2015-11-23 MED ORDER — PREDNISONE 20 MG PO TABS
20.0000 mg | ORAL_TABLET | Freq: Every day | ORAL | Status: AC
  Administered 2015-11-24 – 2015-11-25 (×2): 20 mg via ORAL
  Filled 2015-11-23 (×2): qty 1

## 2015-11-23 MED ORDER — BOOST / RESOURCE BREEZE PO LIQD
1.0000 | Freq: Two times a day (BID) | ORAL | Status: DC
  Administered 2015-11-23 – 2015-11-28 (×5): 1 via ORAL
  Administered 2015-11-28: 237 mL via ORAL
  Administered 2015-11-29: 1 via ORAL

## 2015-11-23 NOTE — Clinical Social Work Note (Signed)
CSW spoke with patient's son in patient's room this afternoon and he does not want patient to go to a nursing facility for STR. [name] states he would be agreeable for home health but not rehab. MD updated. Shela Leff MSW,LCSW [phone]

## 2015-11-23 NOTE — Progress Notes (Signed)
[name] [name] at [place] NAME: [name] [name]    MR#:  [id number]  DATE OF BIRTH:  03-Oct-1940  SUBJECTIVE:  CHIEF COMPLAINT:   Chief Complaint  Patient presents with  . Shortness of Breath  . Altered Mental Status   Pleasantly confused. Sitting in chair. On room air. Afebrile.  REVIEW OF SYSTEMS:  Review of Systems  Constitutional: Positive for malaise/fatigue. Negative for chills and fever.  HENT: Negative for ear discharge, ear pain, hearing loss and nosebleeds.   Eyes: Negative for blurred vision and double vision.  Respiratory: Positive for shortness of breath. Negative for cough and wheezing.   Cardiovascular: Negative for chest pain, palpitations and leg swelling.  Gastrointestinal: Negative for abdominal pain, constipation, diarrhea, nausea and vomiting.  Genitourinary: Negative for dysuria.  Musculoskeletal: Negative for myalgias.  Neurological: Negative for dizziness, sensory change, speech change, focal weakness, seizures and headaches.  Psychiatric/Behavioral: Positive for hallucinations. Negative for depression.    DRUG ALLERGIES:   Allergies  Allergen Reactions  . Contrast Media [Iodinated Diagnostic Agents] Other (See Comments)    Pt was sent to the ED following contrast media injection at [place]. Unknown reason. She has been premedicated since without complications. Pt to be premedicated prior to contrast media injections  . Ioxaglate Other (See Comments)    Pt was sent to the ED following contrast media injection at [place]. Unknown reason. She has been premedicated since without complications. Pt to be premedicated prior to contrast media injections  . Sulfa Antibiotics     Other reaction(s): Other (See Comments)  . Tetracycline Hives and Other (See Comments)  . White Petrolatum Other (See Comments)  . Amoxicillin-Pot Clavulanate Rash    Blisters in mouth Other reaction(s): Unknown Blisters  in mouth Blisters in mouth  . Tape Rash    VITALS:  Blood pressure 117/74, pulse (!) 107, temperature 98.1 F (36.7 C), temperature source Oral, resp. rate 18, height '5\' 5"'$  (1.651 m), weight 49.9 kg (110 lb 0.2 oz), SpO2 95 %.  PHYSICAL EXAMINATION:  Physical Exam  GENERAL:  75 y.o.-year-old patient lying in the bed with no acute distress.  EYES: Pupils equal, round, reactive to light and accommodation. No scleral icterus. Extraocular muscles intact.  HEENT: Head atraumatic, normocephalic. Oropharynx and nasopharynx clear.  NECK:  Supple, no jugular venous distention. No thyroid enlargement, no tenderness.  LUNGS: Normal breath sounds bilaterally, scattered wheezing, no rales,rhonchi or crepitation. No use of accessory muscles of respiration. Diminished breath sounds at the bases CARDIOVASCULAR: S1, S2 normal. No murmurs, rubs, or gallops.  ABDOMEN: Soft, nontender, nondistended. Bowel sounds present. No organomegaly or mass.  EXTREMITIES: No pedal edema, cyanosis, or clubbing.  NEUROLOGIC: Cranial nerves II through XII are intact. Muscle strength 5/5 in all extremities. Sensation intact. Gait not checked.  moving all extremities fine. PSYCHIATRIC: The patient is alert and Awake, and following commands. Occasional visual hallucinations noted. Occasional flight of thoughts. Talking to people who were not present. SKIN: No obvious rash, lesion, or ulcer.    LABORATORY PANEL:   CBC  Recent Labs Lab 11/20/15 0401  WBC 10.0  HGB 12.1  HCT 35.0  PLT 269   ------------------------------------------------------------------------------------------------------------------  Chemistries   Recent Labs Lab 11/20/15 0401  11/23/15 0431  NA 140  < > 144  K 3.5  < > 4.4  CL 95*  < > 104  CO2 35*  < > 31  GLUCOSE 131*  < > 112*  BUN 28*  < >  31*  CREATININE 1.25*  < > 1.27*  CALCIUM 9.0  < > 9.1  MG 1.9  --   --   < > = values in this interval not  displayed. ------------------------------------------------------------------------------------------------------------------  Cardiac Enzymes  Recent Labs Lab 11/20/15 0401  TROPONINI 0.31*   ------------------------------------------------------------------------------------------------------------------  RADIOLOGY:  Ct Head Wo Contrast  Result Date: 11/22/2015 CLINICAL DATA:  Patient status post fall today. EXAM: CT HEAD WITHOUT CONTRAST TECHNIQUE: Contiguous axial images were obtained from the base of the skull through the vertex without intravenous contrast. COMPARISON:  Head CT scan 11/14/2015.  Brain MRI 12/20/2014. FINDINGS: Brain: Cortical atrophy and chronic microvascular ischemic change are again seen. No evidence of acute intracranial abnormality including hemorrhage, infarct, mass lesion, mass effect, midline shift or abnormal extra-axial fluid collection is identified. No hydrocephalus or pneumocephalus. Vascular: Fairly extensive atherosclerosis noted. Skull: Intact. Sinuses/Orbits: Very small right mastoid effusion is unchanged. Other: None. IMPRESSION: No acute abnormality. Atrophy and chronic microvascular ischemic change. Electronically Signed   By: Inge Rise M.D.   On: 11/22/2015 15:08    EKG:   Orders placed or performed during the hospital encounter of 11/14/15  . ED EKG 12-Lead  . ED EKG 12-Lead  . EKG 12-Lead  . EKG 12-Lead  . EKG 12-Lead  . EKG 12-Lead    ASSESSMENT AND PLAN:   [name] 75-year-old female with past medical history significant for COPD/asthma on home oxygen noncompliant, fibromyalgia, hypertension, neuropathy presented to hospital secondary to acute hypoxic respiratory failure  #1 Acute hypoxic respiratory failure-secondary to COPD exacerbation and pneumonia on admission. -Went into flash pulmonary edema and received Lasix and BiPAP on 11/19/15. -Echocardiogram with EF 44% and diastolic dysfunction. Lasix discontinued. -On admission,  patient failed BiPAP requiring intubation and extubated a fewdays ago - Rocephin and azithromycin for pneumonia. Finished antibiotics -  Repeat chest x-ray showing only atelectasis. -Continue steroids - on prednisone oral taper. inhaler/nebulizer for her COPD.  #2 acute delirium-after extubation. Improving slowly. -Added risperidone at night - Decreased prednisone dose further. Stop after 2 days. Ammonia, b12, tsh normal  Will request neurology to see the patient. Discussed with son the patient will be ready for discharge if no further investigations ordered by neurology.  #3 sepsis-secondary to pneumonia on admission. Resolved  #4 Hypokalemia-replaced.  #5 DVT prophylaxis-on subcutaneous Lovenox  Skilled nursing facility recommended.  All the records are reviewed and case discussed with Care Management/Social Workerr. Management plans discussed with the patient, family and they are in agreement.  CODE STATUS: Full code  TOTAL TIME TAKING CARE OF THIS PATIENT: 35 minutes.   POSSIBLE D/C IN 1-2 DAYS, DEPENDING ON CLINICAL CONDITION.  Hillary Bow R M.D on 11/23/2015 at 10:56 AM  Between 7am to 6pm - Pager - [phone]  After 6pm go to [email or web address] - password [email or web address] [facility]  Office  [phone]  CC: Primary care physician; Tracie Harrier, MD

## 2015-11-23 NOTE — Consult Note (Signed)
[facility] Consult   Reason for Consult:  Consult for patient with multiple medical problems and delirium Referring Physician:  Tressia Miners Patient Identification: [name] [name] MRN:  [id number] Principal Diagnosis: Sepsis [facility]) Diagnosis:   Patient Active Problem List   Diagnosis Date Noted  . Acute respiratory failure ([place]) [J96.00]   . Hyponatremia [E87.1]   . CAP (community acquired pneumonia) [J18.9] 11/15/2015  . Hypotension [I95.9] 11/15/2015  . AKI (acute kidney injury) ([place]) [N17.9] 11/15/2015  . COPD (chronic obstructive pulmonary disease) ([place]) [J44.9] 11/15/2015  . Chronic systolic CHF (congestive heart failure) ([place]) [I50.22] 11/15/2015  . Sepsis ([place]) [A41.9] 11/15/2015  . Altered mental status [R41.82]   . Personal history of tobacco use, presenting hazards to health [Z87.891] 05/17/2015    Total Time spent with patient: 45 minutes  Subjective:   [name] [name] is a 75 y.o. female patient admitted with "I don't know".  HPI:  This is a 75 year old woman with multiple medical problems currently in the hospital being treated for pneumonia also with COPD. Consult for ongoing delirium. Patient seen this evening. She was awake when I came to see her laying quietly in bed. Made good eye contact. She was able to answer questions but did not have much information. She was confused about where she was. Thought that she might possibly be at [facility]. Even after I told her she was in [place] continued to state that she was at [facility]. Patient not able to tell me why she is in the hospital. She was calm and pleasant however. Smiled. Made good eye contact. No sign of agitation.  Social history: Patient is unable to give any information. According to the chart the patient's son takes care of her and does not want her placed in skilled nursing.  Medical history: COPD chronic congestive heart failure currently with pneumonia and respiratory failure.  Substance abuse history:  None presented except for tobacco use  Past Psychiatric History: No known past psychiatric history. Patient denies any psychiatric history. Nothing in the chart indicate past significant psychiatric problems. Degree of dementia at baseline unknown.  Risk to Self: Is patient at risk for suicide?: No Risk to Others:   Prior Inpatient Therapy:   Prior Outpatient Therapy:    Past Medical History:  Past Medical History:  Diagnosis Date  . Asthma   . Breast cancer ([place]) 2009   left  . Cancer ([place])   . CHF (congestive heart failure) ([place])   . Chronic UTI   . COPD (chronic obstructive pulmonary disease) ([place])   . Dizziness   . Fibromyalgia   . Hypertension   . Neuropathy ([place])   . Personal history of tobacco use, presenting hazards to health 05/17/2015  . Polyp, larynx   . RA (rheumatoid arthritis) ([place])   . Sinus infection    recent  . Stumbling gait    to the left  . Supplemental oxygen dependent    2.5l    Past Surgical History:  Procedure Laterality Date  . ABDOMINAL HYSTERECTOMY    . BREAST LUMPECTOMY Left 2009   chemo and radiation  . CYST EXCISION Left 02/27/2015   Procedure: CYST REMOVAL;  Surgeon: Hessie Knows, MD;  Location: [facility] ORS;  Service: Orthopedics;  Laterality: Left;  . EYE MUSCLE SURGERY Right    13 surgeries  . THUMB ARTHROSCOPY Left    Family History:  Family History  Problem Relation Age of Onset  . Diabetes Father   . Stroke Father   .  Heart attack Father   . CAD Sister    Family Psychiatric  History: Unknown Social History:  History  Alcohol Use No     History  Drug Use No    Social History   Social History  . Marital status: Divorced    Spouse name: N/A  . Number of children: N/A  . Years of education: N/A   Social History Main Topics  . Smoking status: Current Every Day Smoker    Packs/day: 0.75    Years: 40.00    Types: Cigarettes  . Smokeless tobacco: Never Used  . Alcohol use No  . Drug use: No  . Sexual activity: Not  Asked   Other Topics Concern  . None   Social History Narrative  . None   Additional Social History:    Allergies:   Allergies  Allergen Reactions  . Contrast Media [Iodinated Diagnostic Agents] Other (See Comments)    Pt was sent to the ED following contrast media injection at [place]. Unknown reason. She has been premedicated since without complications. Pt to be premedicated prior to contrast media injections  . Ioxaglate Other (See Comments)    Pt was sent to the ED following contrast media injection at [place]. Unknown reason. She has been premedicated since without complications. Pt to be premedicated prior to contrast media injections  . Sulfa Antibiotics     Other reaction(s): Other (See Comments)  . Tetracycline Hives and Other (See Comments)  . White Petrolatum Other (See Comments)  . Amoxicillin-Pot Clavulanate Rash    Blisters in mouth Other reaction(s): Unknown Blisters in mouth Blisters in mouth  . Tape Rash    Labs:  Results for orders placed or performed during the hospital encounter of 11/14/15 (from the past 48 hour(s))  Basic metabolic panel     Status: Abnormal   Collection Time: 11/22/15  4:21 AM  Result Value Ref Range   Sodium 140 135 - 145 mmol/L   Potassium 3.1 (L) 3.5 - 5.1 mmol/L   Chloride 96 (L) 101 - 111 mmol/L   CO2 35 (H) 22 - 32 mmol/L   Glucose, Bld 110 (H) 65 - 99 mg/dL   BUN 29 (H) 6 - 20 mg/dL   Creatinine, Ser 1.27 (H) 0.44 - 1.00 mg/dL   Calcium 8.9 8.9 - 10.3 mg/dL   GFR calc non Af Amer 41 (L) >60 mL/min   GFR calc Af Amer 47 (L) >60 mL/min    Comment: (NOTE) The eGFR has been calculated using the CKD EPI equation. This calculation has not been validated in all clinical situations. eGFR's persistently <60 mL/min signify possible Chronic Kidney Disease.    Anion gap 9 5 - 15  Glucose, capillary     Status: Abnormal   Collection Time: 11/22/15  7:33 AM  Result Value Ref Range   Glucose-Capillary 117  (H) 65 - 99 mg/dL   Comment 1 Notify RN   Glucose, capillary     Status: Abnormal   Collection Time: 11/22/15 11:53 AM  Result Value Ref Range   Glucose-Capillary 142 (H) 65 - 99 mg/dL   Comment 1 Notify RN   Vitamin B12     Status: None   Collection Time: 11/22/15  3:16 PM  Result Value Ref Range   Vitamin B-12 535 180 - 914 pg/mL    Comment: (NOTE) This assay is not validated for testing neonatal or myeloproliferative syndrome specimens for Vitamin B12 levels. Performed at [facility]  TSH     Status: None   Collection Time: 11/22/15  3:16 PM  Result Value Ref Range   TSH 0.873 0.350 - 4.500 uIU/mL    Comment: Performed by a 3rd Generation assay with a functional sensitivity of <=0.01 uIU/mL.  Ammonia     Status: None   Collection Time: 11/22/15  3:16 PM  Result Value Ref Range   Ammonia 10 9 - 35 umol/L  Glucose, capillary     Status: Abnormal   Collection Time: 11/22/15  4:58 PM  Result Value Ref Range   Glucose-Capillary 177 (H) 65 - 99 mg/dL   Comment 1 Notify RN   Glucose, capillary     Status: Abnormal   Collection Time: 11/22/15  9:17 PM  Result Value Ref Range   Glucose-Capillary 141 (H) 65 - 99 mg/dL   Comment 1 Notify RN   Basic metabolic panel     Status: Abnormal   Collection Time: 11/23/15  4:31 AM  Result Value Ref Range   Sodium 144 135 - 145 mmol/L   Potassium 4.4 3.5 - 5.1 mmol/L   Chloride 104 101 - 111 mmol/L   CO2 31 22 - 32 mmol/L   Glucose, Bld 112 (H) 65 - 99 mg/dL   BUN 31 (H) 6 - 20 mg/dL   Creatinine, Ser 1.27 (H) 0.44 - 1.00 mg/dL   Calcium 9.1 8.9 - 10.3 mg/dL   GFR calc non Af Amer 41 (L) >60 mL/min   GFR calc Af Amer 47 (L) >60 mL/min    Comment: (NOTE) The eGFR has been calculated using the CKD EPI equation. This calculation has not been validated in all clinical situations. eGFR's persistently <60 mL/min signify possible Chronic Kidney Disease.    Anion gap 9 5 - 15  Glucose, capillary     Status: Abnormal   Collection  Time: 11/23/15  7:28 AM  Result Value Ref Range   Glucose-Capillary 120 (H) 65 - 99 mg/dL  Glucose, capillary     Status: Abnormal   Collection Time: 11/23/15 11:48 AM  Result Value Ref Range   Glucose-Capillary 243 (H) 65 - 99 mg/dL  Glucose, capillary     Status: Abnormal   Collection Time: 11/23/15  4:46 PM  Result Value Ref Range   Glucose-Capillary 141 (H) 65 - 99 mg/dL  Glucose, capillary     Status: Abnormal   Collection Time: 11/23/15  9:14 PM  Result Value Ref Range   Glucose-Capillary 122 (H) 65 - 99 mg/dL   Comment 1 Notify RN     Current Facility-Administered Medications  Medication Dose Route Frequency Provider Last Rate Last Dose  . acetaminophen (TYLENOL) tablet 650 mg  650 mg Per Tube Q6H PRN Wilhelmina Mcardle, MD   650 mg at 11/18/15 2109   Or  . acetaminophen (TYLENOL) suppository 650 mg  650 mg Rectal Q6H PRN Wilhelmina Mcardle, MD      . albuterol (PROVENTIL) (2.5 MG/3ML) 0.083% nebulizer solution 2.5 mg  2.5 mg Nebulization Q3H PRN Wilhelmina Mcardle, MD      . bisacodyl (DULCOLAX) suppository 10 mg  10 mg Rectal Daily PRN Gladstone Lighter, MD      . budesonide (PULMICORT) nebulizer solution 0.5 mg  0.5 mg Nebulization BID Napoleon Form, RPH   0.5 mg at 11/22/15 2033  . carvedilol (COREG) tablet 12.5 mg  12.5 mg Oral BID WC Gladstone Lighter, MD   12.5 mg at 11/23/15 1610  . chlorpheniramine-HYDROcodone (TUSSIONEX) 10-8 MG/5ML  suspension 5 mL  5 mL Oral Q12H Gladstone Lighter, MD   5 mL at 11/23/15 2134  . clonazePAM (KLONOPIN) tablet 1 mg  1 mg Oral BID Holley Raring, NP   1 mg at 11/23/15 2134  . enoxaparin (LOVENOX) injection 40 mg  40 mg Subcutaneous Q24H Vishal Mungal, MD   40 mg at 11/23/15 2134  . famotidine (PEPCID) tablet 20 mg  20 mg Oral Daily Sheema M Hallaji, RPH   20 mg at 11/23/15 1204  . feeding supplement (BOOST / RESOURCE BREEZE) liquid 1 Container  1 Container Oral BID BM Hillary Bow, MD   1 Container at 11/23/15 1959  . [START ON 11/24/2015]  feeding supplement (ENSURE ENLIVE) (ENSURE ENLIVE) liquid 237 mL  237 mL Oral Q1500 Srikar Sudini, MD      . haloperidol lactate (HALDOL) injection 0.5 mg  0.5 mg Intravenous Q6H PRN Gonzella Lex, MD      . haloperidol lactate (HALDOL) injection 1 mg  1 mg Intravenous Q6H PRN Hillary Bow, MD   1 mg at 11/23/15 1610  . insulin aspart (novoLOG) injection 0-9 Units  0-9 Units Subcutaneous TID WC Gladstone Lighter, MD   1 Units at 11/23/15 1706  . ipratropium-albuterol (DUONEB) 0.5-2.5 (3) MG/3ML nebulizer solution 3 mL  3 mL Nebulization Q4H Flora Lipps, MD   3 mL at 11/22/15 2033  . LORazepam (ATIVAN) injection 0.5 mg  0.5 mg Intravenous Q6H PRN Bincy S Varughese, NP   0.5 mg at 11/23/15 1500  . ondansetron (ZOFRAN) tablet 4 mg  4 mg Oral Q6H PRN Lance Coon, MD       Or  . ondansetron [facility]) injection 4 mg  4 mg Intravenous Q6H PRN Lance Coon, MD   4 mg at 11/17/15 1023  . phenol (CHLORASEPTIC) mouth spray 1 spray  1 spray Mouth/Throat PRN Srikar Sudini, MD      . polyethylene glycol (MIRALAX / GLYCOLAX) packet 17 g  17 g Oral Daily Gladstone Lighter, MD   17 g at 11/23/15 1038  . [START ON 11/24/2015] predniSONE (DELTASONE) tablet 20 mg  20 mg Oral Q breakfast Srikar Sudini, MD      . risperiDONE (RISPERDAL M-TABS) disintegrating tablet 1 mg  1 mg Oral QHS Gladstone Lighter, MD   1 mg at 11/23/15 2134  . senna-docusate (Senokot-S) tablet 2 tablet  2 tablet Oral BID Gladstone Lighter, MD   2 tablet at 11/23/15 2134  . sodium chloride flush (NS) 0.9 % injection 3 mL  3 mL Intravenous Q12H Gladstone Lighter, MD   3 mL at 11/23/15 2200  . sodium chloride flush (NS) 0.9 % injection 3 mL  3 mL Intravenous PRN Gladstone Lighter, MD   3 mL at 11/18/15 1227    Musculoskeletal: Strength & Muscle Tone: decreased Gait & Station: unable to stand Patient leans: N/A  Psychiatric Specialty Exam: Physical Exam  Nursing note and vitals reviewed. Constitutional: She appears well-developed and  well-nourished.  HENT:  Head: Normocephalic and atraumatic.  Eyes: Conjunctivae are normal. Pupils are equal, round, and reactive to light.  Neck: Normal range of motion.  Cardiovascular: Normal heart sounds.   Respiratory: Effort normal. No respiratory distress.  GI: Soft.  Musculoskeletal: Normal range of motion.  Neurological: She is alert.  Skin: Skin is warm and dry.  Psychiatric: Her affect is blunt. Her speech is delayed. She is slowed. She expresses no suicidal ideation. She exhibits abnormal recent memory and abnormal remote memory.    Review  of Systems  Gastrointestinal: Negative.   Psychiatric/Behavioral: Positive for memory loss. Negative for depression, hallucinations, substance abuse and suicidal ideas. The patient is not nervous/anxious and does not have insomnia.     Blood pressure (!) 110/58, pulse 96, temperature 98.1 F (36.7 C), temperature source Oral, resp. rate 18, height '5\' 5"'  (1.651 m), weight 49.9 kg (110 lb 0.2 oz), SpO2 98 %.Body mass index is 18.31 kg/m.  General Appearance: Casual  Eye Contact:  Good  Speech:  Clear and Coherent  Volume:  Decreased  Mood:  Euthymic  Affect:  Constricted  Thought Process:  Goal Directed and Irrelevant  Orientation:  Negative  Thought Content:  Tangential  Suicidal Thoughts:  No  Homicidal Thoughts:  No  Memory:  Immediate;   Fair Recent;   Poor Remote;   Fair  Judgement:  Impaired  Insight:  Shallow  Psychomotor Activity:  Decreased  Concentration:  Concentration: Poor  Recall:  Poor  Fund of Knowledge:  Poor  Language:  Poor  Akathisia:  No  Handed:  Right  AIMS (if indicated):     Assets:  Financial Resources/Insurance Housing Resilience Social Support  ADL's:  Impaired  Cognition:  Impaired,  Mild and Moderate  Sleep:        Treatment Plan Summary: Medication management and Plan 75 year old woman with delirium. Staff has ordered he taken care of appropriate measures to help with sundowning and  agitation. She is on low dose of Risperdal dissolving tablets and appears to have tolerated that fine. No sign of any side effects at this point. Patient appears to at least be calm for the time being. I've made sure there is an order for Haldol 0.5 mg intravenous every 6 hours when necessary for agitation. Delirium and confusion hopefully should clear up as medical problems improved but she has a large number of reasons for being delirious and confused still. I will follow-up as needed.  Disposition: Patient does not meet criteria for psychiatric inpatient admission. Supportive therapy provided about ongoing stressors.  Alethia Berthold, MD 11/23/2015 10:33 PM

## 2015-11-23 NOTE — Care Management Important Message (Signed)
Important Message  Patient Details  Name: [name] [name] MRN: [id number] Date of Birth: Sep 14, 1940   Medicare Important Message Given:  Yes    Beverly Sessions, RN 11/23/2015, 2:02 PM

## 2015-11-23 NOTE — Care Management (Signed)
Informed by CSW that son has declined STR at discharge.  Patient lives at home with her son.  Due to increased agitation, and medication administration unable to assess preference of home health agency at  This time.

## 2015-11-23 NOTE — Progress Notes (Signed)
Speech Language Pathology Treatment: Dysphagia  Patient Details Name: [name] [name] MRN: [id number] DOB: [birth year] Today's Date: 11/23/2015 Time: 1020-1050 SLP Time Calculation (min) (ACUTE ONLY): 30 min  Assessment / Plan / Recommendation Clinical Impression  Pt appears to adequately tolerate her current dysphagia level 3 diet w/ thin liquids w/ no reported or observed overt s/s of aspiration; no decline in respiratory status at this time. Pt consumed trials of thin liquids w/ no overt s/s of aspiration but required moderate verbal cues to direct attention to task of taking po's; severely tangential; confusion noted during her conversation and comments to this SLP. Recommend f/u upon discharge to address Cognitive status and to determine appropriate poc for functional ADLs and safety awareness w/ ADLs. Pt requires 100% supervision at this time for safety in light of current Cognitive status presentation.  Recommend continue w/ current diet consistency; general aspiration precautions; meds in Puree as needed. No further skilled ST services indicated for dysphagia. NSG updated and agreed. CM updated.    HPI  Pt is a 75 y.o. female w/ multiple medical dxs per chart who presents with Encephalopathy and shortness of breath. She was brought to the ED and found to be septic with pneumonia on CT scan. Hospitals were called for admission.  Shortly after evaluation admission she became acutely more hypoxic with O2 sats to the 60s and increased work of breathing. She was started on BiPAP and intensivists were consulted.      SLP Plan  All goals met     Recommendations  Diet recommendations: Dysphagia 3 (mechanical soft);Thin liquid Liquids provided via: Cup;Straw Medication Administration: Whole meds with puree (as needed d/t Cognitive status) Supervision: Patient able to self feed;Intermittent supervision to cue for compensatory strategies (setup assist) Compensations: Minimize environmental  distractions;Slow rate;Small sips/bites Postural Changes and/or Swallow Maneuvers: Seated upright 90 degrees;Upright 30-60 min after meal                General recommendations:  (Dietician f/u ) Oral Care Recommendations: Oral care BID;Staff/trained caregiver to provide oral care Follow up Recommendations: None Plan: All goals met       GO                Orinda Kenner, MS, CCC-SLP  Edit Ricciardelli 11/23/2015, 5:04 PM

## 2015-11-23 NOTE — Progress Notes (Signed)
Physical Therapy Treatment Patient Details Name: [name] [name] MRN: [id number] DOB: 04/16/1940 Today's Date: 11/23/2015    History of Present Illness presented to ER secondary to AMS, SOB; admitted with sepsis related to CAP/COPD exacerbation.  Initially requiring BiPAP, subsequently intubated 11/2-11/4.  Hospital course additionally significant for intermittent agitation and return transfer to CCU 11/6 due to flash pulmonary edema; now returned to med-surg unit and on RA.  Maintaining sats >90% throughout session.    PT Comments    Pt in chair.  Confused tying to dial [phone] to reach son and becoming frustrated with phone.  Pointed out to her that it was an odd phone number.  She looked at it and finally agreed but could not figure out why it was off. Confusion continued to affect attempts at exercises this am as she had poor attention to tasks.  She was able to stand ambulate 100' with walker and min guard/min assist due to balance deficits.  She did not attempt to sit during therapy session as noted this am with nursing but she is unsteady and unpredictable at times and difficult to re-direct during ambulation in hallway.  2nd assist was used for wheelchair follow for safety.     Follow Up Recommendations  SNF     Equipment Recommendations       Recommendations for Other Services       Precautions / Restrictions Precautions Precautions: Fall Restrictions Weight Bearing Restrictions: No    Mobility  Bed Mobility                  Transfers Overall transfer level: Needs assistance Equipment used: Rolling walker (2 wheeled) Transfers: Sit to/from Stand Sit to Stand: Min guard            Ambulation/Gait Ambulation/Gait assistance: Min guard;Min assist Ambulation Distance (Feet): 100 Feet Assistive device: Rolling walker (2 wheeled) Gait Pattern/deviations: Step-through pattern;Staggering left;Staggering right   Gait velocity interpretation: <1.8 ft/sec,  indicative of risk for recurrent falls General Gait Details: unsteady with poor awareness of balance deficits and self-correction   Stairs            Wheelchair Mobility    Modified Rankin (Stroke Patients Only)       Balance           Standing balance support: Bilateral upper extremity supported Standing balance-Leahy Scale: Poor                      Cognition Arousal/Alertness: Awake/alert Behavior During Therapy: WFL for tasks assessed/performed Overall Cognitive Status: Impaired/Different from baseline Area of Impairment: Attention;Following commands;Safety/judgement;Awareness (confusion limiting participation at times)       Following Commands: Follows one step commands inconsistently Safety/Judgement: Decreased awareness of safety;Decreased awareness of deficits          Exercises      General Comments        Pertinent Vitals/Pain Pain Assessment: No/denies pain    Home Living                      Prior Function            PT Goals (current goals can now be found in the care plan section)      Frequency    Min 2X/week      PT Plan Current plan remains appropriate    Co-evaluation             End of Session Equipment Utilized During  Treatment: Gait belt Activity Tolerance: Patient tolerated treatment well;Other (comment) (limited by confusion) Patient left: in chair;with call bell/phone within reach;with chair alarm set     Time: 1281-1886 PT Time Calculation (min) (ACUTE ONLY): 19 min  Charges:  $Gait Training: 8-22 mins                    G Codes:      Chesley Noon Dec 20, 2015, 12:20 PM

## 2015-11-23 NOTE — Progress Notes (Signed)
Nutrition Follow-up  DOCUMENTATION CODES:   Non-severe (moderate) malnutrition in context of acute illness/injury  INTERVENTION:  -Recommend alternating Boost Breeze and Ensure between meals; continue Magic Cup on meal trays -Pt complaining of sore mouth/sore throat, discussed with Barnabas Lister RN -Recommend further intervention regarding bowel regimen; pt may benefit from suppository and/or enema -Feeding assistance and encouragement at meal times   NUTRITION DIAGNOSIS:   Inadequate oral intake related to acute illness as evidenced by NPO status.  Continues but being addressed as modifying supplements, feeding assistance  GOAL:   Patient will meet greater than or equal to 90% of their needs  MONITOR:   PO intake, Supplement acceptance, Labs, Weight trends  REASON FOR ASSESSMENT:   Ventilator    ASSESSMENT:   75 yo female admitted with acute respiratory failure from COPD exacerbation and pneumonia requiring intubation on 11/2.   Pt alert but confused on visit today. Untouched lunch tray at bedside, pt reports she is not hungry. Noted untouched Ensure at bedside but pt has Boost Breeze container that is empty. Pt complains of sore mouth and sore throat; reports it is the "worst sore throat" she has ever had.  Pt with no documentation of BM since admission; pt on senna-docusate since 11/4, miralax started yesterday  Noted weight trending down, recorded po intake 29% of meals on average. Ate 0% at lunch today   Nutrition-Focused physical exam completed. Findings are mild fat depletion, mild muscle depletion, and no edema.   Diet Order:  DIET DYS 3 Room service appropriate? Yes with Assist; Fluid consistency: Thin  Skin:  Reviewed, no issues  Last BM:  no documented BM since admission (9 days)  Height:   Ht Readings from Last 1 Encounters:  11/15/15 '5\' 5"'$  (1.651 m)    Weight:   Wt Readings from Last 1 Encounters:  11/23/15 110 lb 0.2 oz (49.9 kg)   Filed Weights   11/21/15 0500 11/22/15 0510 11/23/15 0652  Weight: 120 lb 6.4 oz (54.6 kg) 113 lb 3.2 oz (51.3 kg) 110 lb 0.2 oz (49.9 kg)    BMI:  Body mass index is 18.31 kg/m.  Estimated Nutritional Needs:   Kcal:  1500-1800 kcals  Protein:  70-78 g  Fluid:  >/= 1.5 L  EDUCATION NEEDS:   No education needs identified at this time  [place], [place], [place] [phone] Pager  [phone] Weekend/On-Call Pager

## 2015-11-24 ENCOUNTER — Inpatient Hospital Stay: Payer: Commercial Managed Care - HMO

## 2015-11-24 DIAGNOSIS — E46 Unspecified protein-calorie malnutrition: Secondary | ICD-10-CM | POA: Insufficient documentation

## 2015-11-24 LAB — GLUCOSE, CAPILLARY
GLUCOSE-CAPILLARY: 107 mg/dL — AB (ref 65–99)
GLUCOSE-CAPILLARY: 108 mg/dL — AB (ref 65–99)
Glucose-Capillary: 140 mg/dL — ABNORMAL HIGH (ref 65–99)
Glucose-Capillary: 96 mg/dL (ref 65–99)

## 2015-11-24 MED ORDER — IPRATROPIUM-ALBUTEROL 0.5-2.5 (3) MG/3ML IN SOLN
3.0000 mL | Freq: Two times a day (BID) | RESPIRATORY_TRACT | Status: DC
  Administered 2015-11-25 – 2015-11-29 (×5): 3 mL via RESPIRATORY_TRACT
  Filled 2015-11-24 (×8): qty 3

## 2015-11-24 NOTE — Progress Notes (Signed)
Sound was heard in the hall. Checked on pt and pt was found sitting on her bottom on the floor. Pt had no complaints of pain and was requesting to be helped up. Pt was placed back into the bed. Pt was agitated at staff attempting to assess her saying that she was fine and to leave her alone. No new bruising or skin tears noted upon assessment. Pt made comfortable and bed alarm on.

## 2015-11-24 NOTE — Progress Notes (Signed)
[facility] at [place] NAME: [name] [name]    MR#:  [id number]  DATE OF BIRTH:  [birth year]  SUBJECTIVE:  CHIEF COMPLAINT:  Pt is pleasantly altered, fell today, sitter placed for safety  REVIEW OF SYSTEMS:  Ros unobtainable 2/ 2 delerium DRUG ALLERGIES:   Allergies  Allergen Reactions  . Contrast Media [Iodinated Diagnostic Agents] Other (See Comments)    Pt was sent to the ED following contrast media injection at [facility]. Unknown reason. She has been premedicated since without complications. Pt to be premedicated prior to contrast media injections  . Ioxaglate Other (See Comments)    Pt was sent to the ED following contrast media injection at [facility]. Unknown reason. She has been premedicated since without complications. Pt to be premedicated prior to contrast media injections  . Sulfa Antibiotics     Other reaction(s): Other (See Comments)  . Tetracycline Hives and Other (See Comments)  . White Petrolatum Other (See Comments)  . Amoxicillin-Pot Clavulanate Rash    Blisters in mouth Other reaction(s): Unknown Blisters in mouth Blisters in mouth  . Tape Rash    VITALS:  Blood pressure (!) 118/55, pulse 93, temperature 98.4 F (36.9 C), temperature source Oral, resp. rate (!) 24, height '5\' 5"'$  (1.651 m), weight 50 kg (110 lb 3.7 oz), SpO2 92 %.  PHYSICAL EXAMINATION:  GENERAL:  75 y.o.-year-old patient lying in the bed with no acute distress.  EYES: Pupils equal, round, reactive to light and accommodation. No scleral icterus.  HEENT: Head atraumatic, normocephalic. Oropharynx and nasopharynx clear.  NECK:  Supple, no jugular venous distention. No thyroid enlargement, no tenderness.  LUNGS: Normal breath sounds bilaterally, no wheezing, rales,rhonchi or crepitation. No use of accessory muscles of respiration.  CARDIOVASCULAR: S1, S2 normal. No murmurs, rubs, or gallops.  ABDOMEN: Soft, nontender,  nondistended. Bowel sounds present. No organomegaly or mass.  EXTREMITIES: No pedal edema, cyanosis, or clubbing.  NEUROLOGIC: pleasantly altered. Sensation intact. Gait not checked.  PSYCHIATRIC: The patient is alert and oriented 1  SKIN: No obvious rash, lesion, or ulcer.    LABORATORY PANEL:   CBC  Recent Labs Lab 11/20/15 0401  WBC 10.0  HGB 12.1  HCT 35.0  PLT 269   ------------------------------------------------------------------------------------------------------------------  Chemistries   Recent Labs Lab 11/20/15 0401  11/23/15 0431  NA 140  < > 144  K 3.5  < > 4.4  CL 95*  < > 104  CO2 35*  < > 31  GLUCOSE 131*  < > 112*  BUN 28*  < > 31*  CREATININE 1.25*  < > 1.27*  CALCIUM 9.0  < > 9.1  MG 1.9  --   --   < > = values in this interval not displayed. ------------------------------------------------------------------------------------------------------------------  Cardiac Enzymes  Recent Labs Lab 11/20/15 0401  TROPONINI 0.31*   ------------------------------------------------------------------------------------------------------------------  RADIOLOGY:  Dg Hip Unilat With Pelvis 2-3 Views Right  Result Date: 11/24/2015 CLINICAL DATA:  Recent fall, acute right hip pain. EXAM: DG HIP (WITH OR WITHOUT PELVIS) 2-3V RIGHT COMPARISON:  10/03/2015 CT reconstructions. FINDINGS: There is no evidence of hip fracture or dislocation. There is no evidence of arthropathy or other focal bone abnormality. Normal bowel gas pattern. Iliac and femoral atherosclerosis noted. IMPRESSION: No acute osseous finding. Iliofemoral atherosclerosis. Electronically Signed   By: Jerilynn Mages.  Shick M.D.   On: 11/24/2015 10:11    EKG:   Orders placed or performed during the hospital encounter of 11/14/15  .  ED EKG 12-Lead  . ED EKG 12-Lead  . EKG 12-Lead  . EKG 12-Lead  . EKG 12-Lead  . EKG 12-Lead    ASSESSMENT AND PLAN:   [name] [name] 47-year-old female with past medical history  significant for COPD/asthma on home oxygen noncompliant, fibromyalgia, hypertension, neuropathy presented to hospital secondary to acute hypoxic respiratory failure  #1 Acute hypoxic respiratory failure-secondary to COPD exacerbation and pneumonia on admission. -Went into flash pulmonary edema and received Lasix and BiPAP on 11/19/15. -Echocardiogram with EF 71% and diastolic dysfunction. Lasix discontinued. -On admission, patient failed BiPAP requiring intubation and extubated a fewdays ago - Rocephin and azithromycin for pneumonia. Finished antibiotics -  Repeat chest x-ray showing only atelectasis. -Continue steroids - on prednisone oral taper. inhaler/nebulizer for her COPD.  #2 acute delirium-after extubation. Improving slowly. -Added risperidone at night - Decreased prednisone dose further. Stop after 2 days. Ammonia, b12, tsh normal  Will request neurology to see the patient. Discussed with son the patient will be ready for discharge if no further investigations ordered by neurology.  #3 sepsis-secondary to pneumonia on admission. Resolved  #4 Hypokalemia-replaced.  #5 DVT prophylaxis-on subcutaneous Lovenox  Skilled nursing facility recommended.     All the records are reviewed and case discussed with Care Management/Social Workerr. Management plans discussed with the patient's son Ronalee Belts via phone and he is in agreement.  CODE STATUS:fc,son hcpoa  TOTAL TIME TAKING CARE OF THIS PATIENT: 34 minutes.   POSSIBLE D/C IN 1-2 DAYS, DEPENDING ON CLINICAL CONDITION.  Note: This dictation was prepared with Dragon dictation along with smaller phrase technology. Any transcriptional errors that result from this process are unintentional.   Nicholes Mango M.D on 11/24/2015 at 5:34 PM  Between 7am to 6pm - Pager - [phone] After 6pm go to [email or web address] - password [email or web address] [facility]  Office  [phone]  CC: Primary care physician;  Tracie Harrier, MD

## 2015-11-24 NOTE — Progress Notes (Signed)
Spoke with Babette Relic (son) regarding pts fall. Son had no questions at this time.

## 2015-11-24 NOTE — Progress Notes (Signed)
I have paged Dr. Darvin Neighbours twice, at [phone] and [phone]. No return call received at this time.

## 2015-11-25 LAB — GLUCOSE, CAPILLARY
GLUCOSE-CAPILLARY: 107 mg/dL — AB (ref 65–99)
GLUCOSE-CAPILLARY: 168 mg/dL — AB (ref 65–99)
GLUCOSE-CAPILLARY: 190 mg/dL — AB (ref 65–99)
GLUCOSE-CAPILLARY: 120 mg/dL — AB (ref 65–99)
Glucose-Capillary: 115 mg/dL — ABNORMAL HIGH (ref 65–99)

## 2015-11-25 NOTE — Progress Notes (Signed)
[facility] at [place] NAME: [name] [name]    MR#:  [id number]  DATE OF BIRTH:  [birth year]  SUBJECTIVE:  CHIEF COMPLAINT:  Pt is OOB to chair , more meaningful conversation today but still some confusion, Will d/c sitter  REVIEW OF SYSTEMS:  Ros limited HEENT- no headache , blurry vision Chest- no dizziness or chest  Pain Lungs- no sob GI- no abd pain , nausea and vomiting   DRUG ALLERGIES:   Allergies  Allergen Reactions  . Contrast Media [Iodinated Diagnostic Agents] Other (See Comments)    Pt was sent to the ED following contrast media injection at [facility]. Unknown reason. She has been premedicated since without complications. Pt to be premedicated prior to contrast media injections  . Ioxaglate Other (See Comments)    Pt was sent to the ED following contrast media injection at [facility]. Unknown reason. She has been premedicated since without complications. Pt to be premedicated prior to contrast media injections  . Sulfa Antibiotics     Other reaction(s): Other (See Comments)  . Tetracycline Hives and Other (See Comments)  . White Petrolatum Other (See Comments)  . Amoxicillin-Pot Clavulanate Rash    Blisters in mouth Other reaction(s): Unknown Blisters in mouth Blisters in mouth  . Tape Rash    VITALS:  Blood pressure 91/76, pulse 88, temperature 98.7 F (37.1 C), temperature source Oral, resp. rate 20, height '5\' 5"'$  (1.651 m), weight 55.3 kg (122 lb), SpO2 92 %.  PHYSICAL EXAMINATION:  GENERAL:  75 y.o.-year-old patient lying in the bed with no acute distress.  EYES: Pupils equal, round, reactive to light and accommodation. No scleral icterus.  HEENT: Head atraumatic, normocephalic. Oropharynx and nasopharynx clear.  NECK:  Supple, no jugular venous distention. No thyroid enlargement, no tenderness.  LUNGS: Normal breath sounds bilaterally, no wheezing, rales,rhonchi or crepitation. No use of  accessory muscles of respiration.  CARDIOVASCULAR: S1, S2 normal. No murmurs, rubs, or gallops.  ABDOMEN: Soft, nontender, nondistended. Bowel sounds present. No organomegaly or mass.  EXTREMITIES: No pedal edema, cyanosis, or clubbing.  NEUROLOGIC: pleasantly altered. Sensation intact. Gait not checked.  PSYCHIATRIC: The patient is alert and oriented 1-2 SKIN: No obvious rash, lesion, or ulcer.    LABORATORY PANEL:   CBC  Recent Labs Lab 11/20/15 0401  WBC 10.0  HGB 12.1  HCT 35.0  PLT 269   ------------------------------------------------------------------------------------------------------------------  Chemistries   Recent Labs Lab 11/20/15 0401  11/23/15 0431  NA 140  < > 144  K 3.5  < > 4.4  CL 95*  < > 104  CO2 35*  < > 31  GLUCOSE 131*  < > 112*  BUN 28*  < > 31*  CREATININE 1.25*  < > 1.27*  CALCIUM 9.0  < > 9.1  MG 1.9  --   --   < > = values in this interval not displayed. ------------------------------------------------------------------------------------------------------------------  Cardiac Enzymes  Recent Labs Lab 11/20/15 0401  TROPONINI 0.31*   ------------------------------------------------------------------------------------------------------------------  RADIOLOGY:  Dg Hip Unilat With Pelvis 2-3 Views Right  Result Date: 11/24/2015 CLINICAL DATA:  Recent fall, acute right hip pain. EXAM: DG HIP (WITH OR WITHOUT PELVIS) 2-3V RIGHT COMPARISON:  10/03/2015 CT reconstructions. FINDINGS: There is no evidence of hip fracture or dislocation. There is no evidence of arthropathy or other focal bone abnormality. Normal bowel gas pattern. Iliac and femoral atherosclerosis noted. IMPRESSION: No acute osseous finding. Iliofemoral atherosclerosis. Electronically Signed   By: Jerilynn Mages.  Shick M.D.   On: 11/24/2015 10:11    EKG:   Orders placed or performed during the hospital encounter of 11/14/15  . ED EKG 12-Lead  . ED EKG 12-Lead  . EKG 12-Lead  . EKG  12-Lead  . EKG 12-Lead  . EKG 12-Lead    ASSESSMENT AND PLAN:   [name] [name] 6-year-old female with past medical history significant for COPD/asthma on home oxygen noncompliant, fibromyalgia, hypertension, neuropathy presented to hospital secondary to acute hypoxic respiratory failure  #1 Acute hypoxic respiratory failure-secondary to COPD exacerbation and pneumonia on admission. -Went into flash pulmonary edema and received Lasix and BiPAP on 11/19/15. -Echocardiogram with EF 63% and diastolic dysfunction. Lasix discontinued. -On admission, patient failed BiPAP requiring intubation and extubated a fewdays ago - Rocephin and azithromycin for pneumonia. Finished antibiotics -  Repeat chest x-ray showing only atelectasis. -Continue steroids - on prednisone oral taper. inhaler/nebulizer for her COPD.  #2 acute delirium-after extubation.  Improving slowly. -Added risperidone at night - Decreased prednisone dose further.  Ammonia, b12, tsh normal    #3 sepsis-secondary to pneumonia on admission. Resolved  #4 Hypokalemia-replaced.  #5 DVT prophylaxis-on subcutaneous Lovenox  D/ced sitter  Skilled nursing facility recommended.     All the records are reviewed and case discussed with Care Management/Social Workerr. Management plans discussed with the patient's son [name] [name] via phone and he is in agreement.  CODE STATUS:fc,son hcpoa  TOTAL TIME TAKING CARE OF THIS PATIENT: 34 minutes.   POSSIBLE D/C IN am DAYS, DEPENDING ON CLINICAL CONDITION.  Note: This dictation was prepared with Dragon dictation along with smaller phrase technology. Any transcriptional errors that result from this process are unintentional.   [name] [name] M.D on 11/25/2015 at 8:52 PM  Between 7am to 6pm - Pager - [phone] After 6pm go to [email or web address] - password [email or web address] [facility]  Office  [phone]  CC: Primary care physician; Tracie Harrier, MD

## 2015-11-26 ENCOUNTER — Inpatient Hospital Stay: Payer: Commercial Managed Care - HMO

## 2015-11-26 DIAGNOSIS — I639 Cerebral infarction, unspecified: Secondary | ICD-10-CM

## 2015-11-26 LAB — GLUCOSE, CAPILLARY
GLUCOSE-CAPILLARY: 140 mg/dL — AB (ref 65–99)
Glucose-Capillary: 103 mg/dL — ABNORMAL HIGH (ref 65–99)
Glucose-Capillary: 165 mg/dL — ABNORMAL HIGH (ref 65–99)
Glucose-Capillary: 87 mg/dL (ref 65–99)

## 2015-11-26 LAB — CREATININE, SERUM
Creatinine, Ser: 1.23 mg/dL — ABNORMAL HIGH (ref 0.44–1.00)
GFR calc Af Amer: 49 mL/min — ABNORMAL LOW (ref >60)
GFR calc non Af Amer: 42 mL/min — ABNORMAL LOW (ref >60)

## 2015-11-26 MED ORDER — ASPIRIN EC 325 MG PO TBEC
325.0000 mg | DELAYED_RELEASE_TABLET | Freq: Every day | ORAL | Status: DC
  Administered 2015-11-26 – 2015-11-29 (×4): 325 mg via ORAL
  Filled 2015-11-26 (×4): qty 1

## 2015-11-26 MED ORDER — SODIUM CHLORIDE 0.9 % IV SOLN
INTRAVENOUS | Status: AC
  Administered 2015-11-26: 17:00:00 via INTRAVENOUS

## 2015-11-26 NOTE — Progress Notes (Signed)
[facility] made a follow-up with the Pt. Pt rolled back from a proceed greeted CH. But when the [facility] was about to enter the Pt's room, the Pt's family members asked [place] to go way and comeback when they are gone. The Pt's family member was angry and unfriendly. CH said thank you and left.     11/26/15 1600  Clinical Encounter Type  Visited With Patient;Patient and family together  Visit Type Follow-up;Spiritual support  Spiritual Encounters  Spiritual Needs Prayer;Other (Comment)  Stress Factors  Patient Stress Factors Not reviewed  Family Stress Factors None identified

## 2015-11-26 NOTE — Care Management Important Message (Signed)
Important Message  Patient Details  Name: [name] [name] MRN: [id number] Date of Birth: [birth year]   Medicare Important Message Given:  Yes    Beverly Sessions, RN 11/26/2015, 1:15 PM

## 2015-11-26 NOTE — Discharge Instructions (Signed)
Confusion Confusion is the inability to think with your usual speed or clarity. Confusion may come on quickly or slowly over time. How quickly the confusion comes on depends on the cause. Confusion can be due to any number of causes. CAUSES   Concussion, head injury, or head trauma.  Seizures.  Stroke.  Fever.  Brain tumor.  Age related decreased brain function (dementia).  Heightened emotional states like rage or terror.  Mental illness in which the person loses the ability to determine what is real and what is not (hallucinations).  Infections such as a urinary tract infection (UTI).  Toxic effects from alcohol, drugs, or prescription medicines.  Dehydration and an imbalance of salts in the body (electrolytes).  Lack of sleep.  Low blood sugar (diabetes).  Low levels of oxygen from conditions such as chronic lung disorders.  Drug interactions or other medicine side effects.  Nutritional deficiencies, especially niacin, thiamine, vitamin C, or vitamin B.  Sudden drop in body temperature (hypothermia).  Change in routine, such as when traveling or hospitalized. SIGNS AND SYMPTOMS  People often describe their thinking as cloudy or unclear when they are confused. Confusion can also include feeling disoriented. That means you are unaware of where or who you are. You may also not know what the date or time is. If confused, you may also have difficulty paying attention, remembering, and making decisions. Some people also act aggressively when they are confused.  DIAGNOSIS  The medical evaluation of confusion may include:  Blood and urine tests.  X-rays.  Brain and nervous system tests.  Analyzing your brain waves (electroencephalogram or EEG).  Magnetic resonance imaging (MRI) of your head.  Computed tomography (CT) scan of your head.  Mental status tests in which your health care provider may ask many questions. Some of these questions may seem silly or strange,  but they are a very important test to help diagnose and treat confusion. TREATMENT  An admission to the hospital may not be needed, but a person with confusion should not be left alone. Stay with a family member or friend until the confusion clears. Avoid alcohol, pain relievers, or sedative drugs until you have fully recovered. Do not drive until directed by your health care provider. HOME CARE INSTRUCTIONS  What family and friends can do:  To find out if someone is confused, ask the person to state his or her name, age, and the date. If the person is unsure or answers incorrectly, he or she is confused.  Always introduce yourself, no matter how well the person knows you.  Often remind the person of his or her location.  Place a calendar and clock near the confused person.  Help the person with his or her medicines. You may want to use a pill box, an alarm as a reminder, or give the person each dose as prescribed.  Talk about current events and plans for the day.  Try to keep the environment calm, quiet, and peaceful.  Make sure the person keeps follow-up visits with his or her health care provider. PREVENTION  Ways to prevent confusion:  Avoid alcohol.  Eat a balanced diet.  Get enough sleep.  Take medicine only as directed by your health care provider.  Do not become isolated. Spend time with other people and make plans for your days.  Keep careful watch on your blood sugar levels if you are diabetic. SEEK IMMEDIATE MEDICAL CARE IF:   You develop severe headaches, repeated vomiting, seizures, blackouts, or   slurred speech.  There is increasing confusion, weakness, numbness, restlessness, or personality changes.  You develop a loss of balance, have marked dizziness, feel uncoordinated, or fall.  You have delusions, hallucinations, or develop severe anxiety.  Your family members think you need to be rechecked.   This information is not intended to replace advice given  to you by your health care provider. Make sure you discuss any questions you have with your health care provider.   Document Released: 02/07/2004 Document Revised: 01/20/2014 Document Reviewed: 02/04/2013 Elsevier Interactive Patient Education 2016 [employer] Inc[employer]  

## 2015-11-26 NOTE — Progress Notes (Signed)
Physical Therapy Treatment Patient Details Name: [name] [name] MRN: [id number] DOB: 05-19-1940 Today's Date: 11/26/2015    History of Present Illness presented to ER secondary to AMS, SOB; admitted with sepsis related to CAP/COPD exacerbation.  Initially requiring BiPAP, subsequently intubated 11/2-11/4.  Hospital course additionally significant for intermittent agitation and return transfer to CCU 11/6 due to flash pulmonary edema; now returned to med-surg unit and on RA.  Maintaining sats >90% throughout session.    PT Comments    Pt in chair, remains confused.  Pt stood and ambulated 35' with hand held assist.  She reported feeling unsteady on her feet and returned to her chair stating her legs felt weak.  Sitter reported pt has been walking laps with hand held assist in hallway.  She reported they used a walker yesterday but due to confusion it was more of a barrier than a help as she was not using it correctly and running into objects.  She remains unsafe to ambulate without assist.    Follow Up Recommendations  SNF     Equipment Recommendations       Recommendations for Other Services       Precautions / Restrictions Precautions Precautions: Fall Restrictions Weight Bearing Restrictions: No    Mobility  Bed Mobility                  Transfers Overall transfer level: Needs assistance   Transfers: Sit to/from Stand Sit to Stand: Min guard            Ambulation/Gait Ambulation/Gait assistance: Min guard Ambulation Distance (Feet): 40 Feet Assistive device: 1 person hand held assist Gait Pattern/deviations: Step-through pattern;Decreased step length - right;Decreased step length - left   Gait velocity interpretation: <1.8 ft/sec, indicative of risk for recurrent falls General Gait Details: reports feeling unsteady   Stairs            Wheelchair Mobility    Modified Rankin (Stroke Patients Only)       Balance Overall balance assessment:  Needs assistance Sitting-balance support: Feet supported Sitting balance-Leahy Scale: Fair     Standing balance support: Single extremity supported Standing balance-Leahy Scale: Fair                      Cognition Arousal/Alertness: Awake/alert Behavior During Therapy: WFL for tasks assessed/performed Overall Cognitive Status: Impaired/Different from baseline Area of Impairment: Attention;Following commands;Safety/judgement;Awareness                    Exercises      General Comments        Pertinent Vitals/Pain Pain Assessment: No/denies pain    Home Living                      Prior Function            PT Goals (current goals can now be found in the care plan section)      Frequency    Min 2X/week      PT Plan Current plan remains appropriate    Co-evaluation             End of Session Equipment Utilized During Treatment: Gait belt Activity Tolerance: Patient tolerated treatment well;Other (comment) Patient left: in chair;with call bell/phone within reach;with nursing/sitter in room     Time: 1239-1247 PT Time Calculation (min) (ACUTE ONLY): 8 min  Charges:  $Gait Training: 8-22 mins  G Codes:      Chesley Noon 11/26/2015, 1:19 PM

## 2015-11-26 NOTE — Care Management (Signed)
Patient for potential discharge today.  Patient lives at home with son.  Son states that patient has RW at home, however at baseline does not use any assistive devices.  Baseline patient is alert and drives herself.  Son is available to transport if needed.  PCP [name].  Pharmacy [facility].  Son has declined for SNF placement.  Home health agency preference was provided.  Patient and son do not have preference.  RN, PT, and aide were ordered.  OT and speech requested.  Referral was made to [facility] with [facility].  Per MD patient to discharge after neurology clears today.  RNCM signing off

## 2015-11-26 NOTE — Consult Note (Signed)
Referring Physician: Gouru    Chief Complaint: Altered mental status  HPI: [name] [name] is an 75 y.o. female admitted with sepsis secondary to PNA.  Was noted to have altered mental status that was felt to be a metabolic encephalopathy.  PNA treated adequately but mental status has not improved.  Unclear exactly what patient's baseline is.  Family not present in the room at this time.    Date last known well: Unable to determine Time last known well: Unable to determine tPA Given: No: Unable to determine LKW  Past Medical History:  Diagnosis Date  . Asthma   . Breast cancer ([place]) 2009   left  . Cancer ([place])   . CHF (congestive heart failure) ([place])   . Chronic UTI   . COPD (chronic obstructive pulmonary disease) ([place])   . Dizziness   . Fibromyalgia   . Hypertension   . Neuropathy ([place])   . Personal history of tobacco use, presenting hazards to health 05/17/2015  . Polyp, larynx   . RA (rheumatoid arthritis) ([place])   . Sinus infection    recent  . Stumbling gait    to the left  . Supplemental oxygen dependent    2.5l    Past Surgical History:  Procedure Laterality Date  . ABDOMINAL HYSTERECTOMY    . BREAST LUMPECTOMY Left 2009   chemo and radiation  . CYST EXCISION Left 02/27/2015   Procedure: CYST REMOVAL;  Surgeon: Hessie Knows, MD;  Location: [facility] ORS;  Service: Orthopedics;  Laterality: Left;  . EYE MUSCLE SURGERY Right    13 surgeries  . THUMB ARTHROSCOPY Left     Family History  Problem Relation Age of Onset  . Diabetes Father   . Stroke Father   . Heart attack Father   . CAD Sister    Social History:  reports that she has been smoking Cigarettes.  She has a 30.00 pack-year smoking history. She has never used smokeless tobacco. She reports that she does not drink alcohol or use drugs.  Allergies:  Allergies  Allergen Reactions  . Contrast Media [Iodinated Diagnostic Agents] Other (See Comments)    Pt was sent to the ED following contrast media injection at  [place]. Unknown reason. She has been premedicated since without complications. Pt to be premedicated prior to contrast media injections  . Ioxaglate Other (See Comments)    Pt was sent to the ED following contrast media injection at [place]. Unknown reason. She has been premedicated since without complications. Pt to be premedicated prior to contrast media injections  . Sulfa Antibiotics     Other reaction(s): Other (See Comments)  . Tetracycline Hives and Other (See Comments)  . White Petrolatum Other (See Comments)  . Amoxicillin-Pot Clavulanate Rash    Blisters in mouth Other reaction(s): Unknown Blisters in mouth Blisters in mouth  . Tape Rash    Medications:  I have reviewed the patient's current medications. Prior to Admission:  Prescriptions Prior to Admission  Medication Sig Dispense Refill Last Dose  . albuterol (PROVENTIL HFA;VENTOLIN HFA) 108 (90 BASE) MCG/ACT inhaler Inhale into the lungs every 6 (six) hours as needed for wheezing or shortness of breath.   Taking  . albuterol-ipratropium (COMBIVENT) 18-103 MCG/ACT inhaler Inhale into the lungs every 4 (four) hours.   Taking  . budesonide-formoterol (SYMBICORT) 160-4.5 MCG/ACT inhaler Inhale 2 puffs into the lungs 2 (two) times daily.   Taking  . Calcium Carb-Cholecalciferol (CALCIUM + D3) 600-200 MG-UNIT TABS Take by mouth.     [name]  calcium-vitamin D (OSCAL WITH D) 500-200 MG-UNIT per tablet Take 1 tablet by mouth at bedtime.   Taking  . carvedilol (COREG) 3.125 MG tablet Take 6.25 mg by mouth.     . carvedilol (COREG) 6.25 MG tablet Take 6.25 mg by mouth 2 (two) times daily with a meal.   Taking  . clonazePAM (KLONOPIN) 1 MG tablet Take 1 mg by mouth 2 (two) times daily.   Taking  . furosemide (LASIX) 20 MG tablet Take 20 mg by mouth every other day.   Taking  . gabapentin (NEURONTIN) 600 MG tablet Take 600 mg by mouth 3 (three) times daily.   Taking  . HYDROcodone-acetaminophen (NORCO) 5-325 MG tablet  Take 1 tablet by mouth every 6 (six) hours as needed for moderate pain. 20 tablet 0 Taking  . HYDROcodone-acetaminophen (NORCO/VICODIN) 5-325 MG tablet Take 1 tablet by mouth every 8 (eight) hours as needed for moderate pain.   Taking  . imipramine (TOFRANIL) 25 MG tablet Take 1 tablet by mouth daily.     [name] lisinopril (PRINIVIL,ZESTRIL) 2.5 MG tablet Take 2.5 mg by mouth daily.   Taking  . Multiple Vitamin (MULTIVITAMIN) tablet Take by mouth.     . multivitamin-iron-minerals-folic acid (CENTRUM) chewable tablet Chew 1 tablet by mouth daily.   Taking  . nitroGLYCERIN (NITROSTAT) 0.4 MG SL tablet Place under the tongue.     . promethazine (PHENERGAN) 12.5 MG tablet Take 12.5 mg by mouth.     . traMADol (ULTRAM) 50 MG tablet Take 50 mg by mouth daily.   Taking  . zolpidem (AMBIEN) 5 MG tablet Take 5 mg by mouth at bedtime as needed for sleep.   Taking   Scheduled: . aspirin EC  325 mg Oral Daily  . budesonide (PULMICORT) nebulizer solution  0.5 mg Nebulization BID  . carvedilol  12.5 mg Oral BID WC  . chlorpheniramine-HYDROcodone  5 mL Oral Q12H  . clonazePAM  1 mg Oral BID  . enoxaparin (LOVENOX) injection  40 mg Subcutaneous Q24H  . famotidine  20 mg Oral Daily  . feeding supplement  1 Container Oral BID BM  . feeding supplement (ENSURE ENLIVE)  237 mL Oral Q1500  . insulin aspart  0-9 Units Subcutaneous TID WC  . ipratropium-albuterol  3 mL Nebulization BID  . polyethylene glycol  17 g Oral Daily  . risperiDONE  1 mg Oral QHS  . senna-docusate  2 tablet Oral BID  . sodium chloride flush  3 mL Intravenous Q12H    ROS: History obtained from the patient  General ROS: negative for - chills, fatigue, fever, night sweats, weight gain or weight loss Psychological ROS: negative for - behavioral disorder, hallucinations, memory difficulties, mood swings or suicidal ideation Ophthalmic ROS: negative for - blurry vision, double vision, eye pain or loss of vision ENT ROS: negative for -  epistaxis, nasal discharge, oral lesions, sore throat, tinnitus or vertigo Allergy and Immunology ROS: negative for - hives or itchy/watery eyes Hematological and Lymphatic ROS: negative for - bleeding problems, bruising or swollen lymph nodes Endocrine ROS: negative for - galactorrhea, hair pattern changes, polydipsia/polyuria or temperature intolerance Respiratory ROS: negative for - cough, hemoptysis, shortness of breath or wheezing Cardiovascular ROS: negative for - chest pain, dyspnea on exertion, edema or irregular heartbeat Gastrointestinal ROS: negative for - abdominal pain, diarrhea, hematemesis, nausea/vomiting or stool incontinence Genito-Urinary ROS: negative for - dysuria, hematuria, incontinence or urinary frequency/urgency Musculoskeletal ROS: negative for - joint swelling or muscular weakness Neurological ROS: as  noted in HPI Dermatological ROS: negative for rash and skin lesion changes  Physical Examination: Blood pressure 108/62, pulse 75, temperature 98.2 F (36.8 C), temperature source Axillary, resp. rate 20, height '5\' 5"'$  (1.651 m), weight 57.2 kg (126 lb), SpO2 95 %.  HEENT-  Normocephalic, no lesions, without obvious abnormality.  Normal external eye and conjunctiva.  Normal TM's bilaterally.  Normal auditory canals and external ears. Normal external nose, mucus membranes and septum.  Normal pharynx. Cardiovascular- S1, S2 normal, pulses palpable throughout   Lungs- chest clear, no wheezing, rales, normal symmetric air entry Abdomen- soft, non-tender; bowel sounds normal; no masses,  no organomegaly Extremities- no edema Lymph-no adenopathy palpable Musculoskeletal-no joint tenderness, deformity or swelling Skin-bruising with cyanosis of toes  Neurological Examination Mental Status: Alert.  Oriented to self.  Unclear about her age, the year and month.  Can not tell me the year she was born.  Confabulatory.  Speech fluent without evidence of aphasia.  Follows simple  command but unable to follow 3 step commands even with reinforcement. Cranial Nerves: II: Discs flat bilaterally; Visual fields grossly normal, pupils equal, round, reactive to light and accommodation III,IV, VI: ptosis not present, extra-ocular motions intact bilaterally V,VII: smile symmetric, facial light touch sensation normal bilaterally VIII: hearing normal bilaterally IX,X: gag reflex present XI: bilateral shoulder shrug XII: midline tongue extension Motor: Right : Upper extremity   5/5    Left:     Upper extremity   5/5  Lower extremity   5/5     Lower extremity   5/5 Tone and bulk:normal tone throughout; no atrophy noted Sensory: Pinprick and light touch intact throughout, bilaterally Deep Tendon Reflexes: 2+ in the upper extremities and absent in the lower extremities Plantars: Right: downgoing   Left: downgoing Cerebellar: Normal finger-to-nose and normal heel-to-shin testing bilaterally Gait: not tested due to safety concerns    Laboratory Studies:  Basic Metabolic Panel:  Recent Labs Lab 11/19/15 2216 11/20/15 0401 11/22/15 0421 11/23/15 0431 11/26/15 0405  NA  --  140 140 144  --   K  --  3.5 3.1* 4.4  --   CL  --  95* 96* 104  --   CO2  --  35* 35* 31  --   GLUCOSE  --  131* 110* 112*  --   BUN  --  28* 29* 31*  --   CREATININE  --  1.25* 1.27* 1.27* 1.23*  CALCIUM  --  9.0 8.9 9.1  --   MG 1.8 1.9  --   --   --     Liver Function Tests: No results for input(s): AST, ALT, ALKPHOS, BILITOT, PROT, ALBUMIN in the last 168 hours. No results for input(s): LIPASE, AMYLASE in the last 168 hours.  Recent Labs Lab 11/22/15 1516  AMMONIA 10    CBC:  Recent Labs Lab 11/20/15 0401  WBC 10.0  NEUTROABS 8.4*  HGB 12.1  HCT 35.0  MCV 90.5  PLT 269    Cardiac Enzymes:  Recent Labs Lab 11/19/15 1800 11/19/15 2216 11/20/15 0401  TROPONINI 0.25* 0.45* 0.31*    BNP: Invalid input(s): POCBNP  CBG:  Recent Labs Lab 11/25/15 1307  11/25/15 1624 11/25/15 2011 11/26/15 0738 11/26/15 1132  GLUCAP 190* 120* 115* 103* 165*    Microbiology: Results for orders placed or performed during the hospital encounter of 11/14/15  Blood Culture (routine x 2)     Status: None   Collection Time: 11/14/15  9:31 PM  Result  Value Ref Range Status   Specimen Description BLOOD LT FOREARM  Final   Special Requests BOTTLES DRAWN AEROBIC AND ANAEROBIC 6CC  Final   Culture NO GROWTH 5 DAYS  Final   Report Status 11/19/2015 FINAL  Final  Blood Culture (routine x 2)     Status: None   Collection Time: 11/14/15  9:31 PM  Result Value Ref Range Status   Specimen Description BLOOD LEFT AC  Final   Special Requests BOTTLES DRAWN AEROBIC AND ANAEROBIC ANA5CC AER [place]  Final   Culture NO GROWTH 5 DAYS  Final   Report Status 11/19/2015 FINAL  Final  Urine culture     Status: None   Collection Time: 11/14/15  9:31 PM  Result Value Ref Range Status   Specimen Description URINE, RANDOM  Final   Special Requests NONE  Final   Culture NO GROWTH Performed at [facility]   Final   Report Status 11/16/2015 FINAL  Final  MRSA PCR Screening     Status: None   Collection Time: 11/15/15  2:36 AM  Result Value Ref Range Status   MRSA by PCR NEGATIVE NEGATIVE Final    Comment:        The GeneXpert MRSA Assay (FDA approved for NASAL specimens only), is one component of a comprehensive MRSA colonization surveillance program. It is not intended to diagnose MRSA infection nor to guide or monitor treatment for MRSA infections.   Culture, respiratory (NON-Expectorated)     Status: None   Collection Time: 11/15/15 11:59 AM  Result Value Ref Range Status   Specimen Description TRACHEAL ASPIRATE  Final   Special Requests NONE  Final   Gram Stain   Final    FEW WBC PRESENT, PREDOMINANTLY PMN RARE GRAM POSITIVE COCCI IN PAIRS RARE GRAM NEGATIVE RODS FEW SQUAMOUS EPITHELIAL CELLS PRESENT    Culture   Final    Consistent with normal  respiratory flora. Performed at [facility]    Report Status 11/17/2015 FINAL  Final  CULTURE, BLOOD (ROUTINE X 2) w Reflex to ID Panel     Status: None   Collection Time: 11/18/15 10:49 PM  Result Value Ref Range Status   Specimen Description BLOOD LEFT ARM  Final   Special Requests BOTTLES DRAWN AEROBIC AND ANAEROBIC 5CC  Final   Culture NO GROWTH 5 DAYS  Final   Report Status 11/23/2015 FINAL  Final  CULTURE, BLOOD (ROUTINE X 2) w Reflex to ID Panel     Status: None   Collection Time: 11/18/15 10:49 PM  Result Value Ref Range Status   Specimen Description BLOOD LEFT HAND  Final   Special Requests BOTTLES DRAWN AEROBIC AND ANAEROBIC 5CC  Final   Culture NO GROWTH 5 DAYS  Final   Report Status 11/23/2015 FINAL  Final    Coagulation Studies: No results for input(s): LABPROT, INR in the last 72 hours.  Urinalysis:  Recent Labs Lab 11/21/15 1513  COLORURINE YELLOW*  LABSPEC 1.015  PHURINE 6.0  GLUCOSEU NEGATIVE  HGBUR NEGATIVE  BILIRUBINUR NEGATIVE  KETONESUR TRACE*  PROTEINUR NEGATIVE  NITRITE NEGATIVE  LEUKOCYTESUR NEGATIVE    Lipid Panel:    Component Value Date/Time   TRIG 72 11/16/2015 1215    HgbA1C: No results found for: HGBA1C  Urine Drug Screen:  No results found for: LABOPIA, COCAINSCRNUR, LABBENZ, AMPHETMU, THCU, LABBARB  Alcohol Level: No results for input(s): ETH in the last 168 hours.  Other results: EKG: 98 bpm.  Imaging: Mr Brain  Wo Contrast  Result Date: 11/26/2015 CLINICAL DATA:  75 year old female with altered mental status, sepsis. Initial encounter. EXAM: MRI HEAD WITHOUT CONTRAST TECHNIQUE: Multiplanar, multiecho pulse sequences of the brain and surrounding structures were obtained without intravenous contrast. COMPARISON:  Head CT without contrast 11/22/2015 and earlier. Brain MRI 12/20/2014. FINDINGS: Brain: Small 15 mm linear area of restricted diffusion in the right cerebellar hemisphere has associated T2 and mild FLAIR  hyperintensity with no hemorrhage or mass effect. No other restricted diffusion. Elsewhere stable gray and white matter signal since 2016, including minimal to mild for age nonspecific cerebral white matter and right pontine signal changes. No cortical encephalomalacia or chronic cerebral blood products identified. No midline shift, mass effect, evidence of mass lesion, ventriculomegaly, extra-axial collection or acute intracranial hemorrhage. Cervicomedullary junction and pituitary are within normal limits. Vascular: Major intracranial vascular flow voids are stable since 2016, the distal left vertebral artery appears dominant. Skull and upper cervical spine: Negative. Normal bone marrow signal. Sinuses/Orbits: Negative orbits soft tissues. Stable paranasal sinuses status post surgery on the right side. Other: Minimal mastoid fluid is stable. Decreased retained secretions in the nasopharynx. Negative scalp soft tissues. IMPRESSION: 1. Acute lacunar type infarct in the right cerebellum, right PICA territory. No associated hemorrhage or mass effect. 2. Otherwise stable and largely unremarkable for age noncontrast MRI appearance of the brain since 2016. Electronically Signed   By: Genevie Ann M.D.   On: 11/26/2015 14:42    Assessment: 75 y.o. female with altered mental status.  Admitted with sepsis and despite treatment has not returned to baseline mental status.  B12, TSH and ammonia are normal.  MRI of the brain personally reviewed and shows a right cerebellar acute infarct.  Infarct appears lacunar and likely of small vessel etiology.  Echocardiogram performed 11/2 shows an EF of 55-60% with no cardiac source of emboli but of poor quality.  Further work up recommended.   Although acute infarct present, in and of itself would not expect it to lead to current mental status.  This is likely multifactorial and a function of baseline function, prolonged hospitalization, including [name] stay, and infarct.  Expect some  improvement with time but will likely not return to baseline.      Stroke Risk Factors - hypertension  Plan: 1. HgbA1c, fasting lipid panel, B1, folate 2. PT consult, OT consult, Speech consult 3. Carotid dopplers 4. Prophylactic therapy-Antiplatelet med: Aspirin - dose '325mg'$  daily 5. NPO until RN stroke swallow screen 6. Telemetry monitoring 7. Frequent neuro checks 8. EEG  Case discussed with Dr. Leone Brand, MD Neurology [phone] 11/26/2015, 3:04 PM

## 2015-11-26 NOTE — Progress Notes (Addendum)
[name] [name] at [facility] NAME: [name] [name]    MR#:  [id number]  DATE OF BIRTH:  1940/03/09  SUBJECTIVE:  CHIEF COMPLAINT:  Pt is OOB to chair , Ambulating in the hallway, still confused today according to the staff report. According to the son patient was mentating fine prior to this admission  REVIEW OF SYSTEMS:  Ros-Altered mental status   DRUG ALLERGIES:   Allergies  Allergen Reactions  . Contrast Media [Iodinated Diagnostic Agents] Other (See Comments)    Pt was sent to the ED following contrast media injection at [place]. Unknown reason. She has been premedicated since without complications. Pt to be premedicated prior to contrast media injections  . Ioxaglate Other (See Comments)    Pt was sent to the ED following contrast media injection at [place]. Unknown reason. She has been premedicated since without complications. Pt to be premedicated prior to contrast media injections  . Sulfa Antibiotics     Other reaction(s): Other (See Comments)  . Tetracycline Hives and Other (See Comments)  . White Petrolatum Other (See Comments)  . Amoxicillin-Pot Clavulanate Rash    Blisters in mouth Other reaction(s): Unknown Blisters in mouth Blisters in mouth  . Tape Rash    VITALS:  Blood pressure 108/62, pulse 75, temperature 98.2 F (36.8 C), temperature source Axillary, resp. rate 20, height '5\' 5"'$  (1.651 m), weight 57.2 kg (126 lb), SpO2 95 %.  PHYSICAL EXAMINATION:  GENERAL:  75 y.o.-year-old patient lying in the bed with no acute distress.  EYES: Pupils equal, round, reactive to light and accommodation. No scleral icterus.  HEENT: Head atraumatic, normocephalic. Oropharynx and nasopharynx clear.  NECK:  Supple, no jugular venous distention. No thyroid enlargement, no tenderness.  LUNGS: Normal breath sounds bilaterally, no wheezing, rales,rhonchi or crepitation. No use of accessory muscles of respiration.   CARDIOVASCULAR: S1, S2 normal. No murmurs, rubs, or gallops.  ABDOMEN: Soft, nontender, nondistended. Bowel sounds present. No organomegaly or mass.  EXTREMITIES: No pedal edema, cyanosis, or clubbing.  NEUROLOGIC: pleasantly altered. Sensation intact. Gait not checked.  PSYCHIATRIC: The patient is alert and oriented 1-2 SKIN: No obvious rash, lesion, or ulcer.    LABORATORY PANEL:   CBC  Recent Labs Lab 11/20/15 0401  WBC 10.0  HGB 12.1  HCT 35.0  PLT 269   ------------------------------------------------------------------------------------------------------------------  Chemistries   Recent Labs Lab 11/20/15 0401  11/23/15 0431 11/26/15 0405  NA 140  < > 144  --   K 3.5  < > 4.4  --   CL 95*  < > 104  --   CO2 35*  < > 31  --   GLUCOSE 131*  < > 112*  --   BUN 28*  < > 31*  --   CREATININE 1.25*  < > 1.27* 1.23*  CALCIUM 9.0  < > 9.1  --   MG 1.9  --   --   --   < > = values in this interval not displayed. ------------------------------------------------------------------------------------------------------------------  Cardiac Enzymes  Recent Labs Lab 11/20/15 0401  TROPONINI 0.31*   ------------------------------------------------------------------------------------------------------------------  RADIOLOGY:  No results found.  EKG:   Orders placed or performed during the hospital encounter of 11/14/15  . ED EKG 12-Lead  . ED EKG 12-Lead  . EKG 12-Lead  . EKG 12-Lead  . EKG 12-Lead  . EKG 12-Lead    ASSESSMENT AND PLAN:   [name] [name] 5-year-old female with past medical history significant for COPD/asthma  on home oxygen noncompliant, fibromyalgia, hypertension, neuropathy presented to hospital secondary to acute hypoxic respiratory failure    # Acute CVA    -CT head is negative.  MRI of the brain-acute right cerebellar lacunar infarct in rt PICA Ordered carotid Dopplers Bedside swallow evaluation -Check lipid panel, A1c and TSH -Aspirin by  mouth if patient passes bedside swallow evaluation otherwise per rectum -Patient was evaluated by speech therapy before and she is on dysphagia 3 diet right now Patient was already seen by physical therapy recommended skilled nursing facility Echocardiogram with 55-60% ejection fraction performed on 11/15/15 -Neurology consult is placed and discussed with Dr. Doy Mince. Appreciate her recommendations -risperidone at night Appreciate psychiatry recommendations - Patient has completed prednisone tapering  Ammonia, b12, tsh normal  #1 Acute hypoxic respiratory failure-secondary to COPD exacerbation and pneumonia on admission. -Went into flash pulmonary edema and received Lasix and BiPAP on 11/19/15. -Echocardiogram with EF 25% and diastolic dysfunction. Lasix discontinued. -On admission, patient failed BiPAP requiring intubation and extubated a fewdays ago - Rocephin and azithromycin for pneumonia. Finished antibiotics -  Repeat chest x-ray showing only atelectasis. -Continue steroids - on prednisone oral taper. inhaler/nebulizer for her COPD.  #2 acute delirium-after extubation.  Improving slowly. -CT head is negative. MRI of the brain is pending -EEG ordered by neurology -Neurology consult is placed and discussed with Dr. Doy Mince -Added risperidone at night Appreciate psychiatry recommendations - Patient has completed prednisone tapering  Ammonia, b12, tsh normal    #3 sepsis-secondary to pneumonia on admission. Resolved  #4 Hypokalemia-replaced.  #5 DVT prophylaxis-on subcutaneous Lovenox  PRN sitter  Skilled nursing facility recommended by physical therapy     All the records are reviewed and case discussed with Care Management/Social Workerr. Management plans discussed with the patient's son [name] [name] via phone and called him again to update MRI results, could not reach him left voicemail to call us back   CODE STATUS:fc,son [id number]  TOTAL TIME TAKING CARE OF THIS  PATIENT: 34 minutes.   POSSIBLE D/C IN am DAYS, DEPENDING ON CLINICAL CONDITION.  Note: This dictation was prepared with Dragon dictation along with smaller phrase technology. Any transcriptional errors that result from this process are unintentional.   Nicholes Mango M.D on 11/26/2015 at 2:39 PM  Between 7am to 6pm - Pager - [phone] After 6pm go to [email or web address] - password [email or web address] [facility]  Office  [phone]  CC: Primary care physician; Tracie Harrier, MD

## 2015-11-27 LAB — GLUCOSE, CAPILLARY
GLUCOSE-CAPILLARY: 92 mg/dL (ref 65–99)
Glucose-Capillary: 92 mg/dL (ref 65–99)
Glucose-Capillary: 104 mg/dL — ABNORMAL HIGH (ref 65–99)
Glucose-Capillary: 139 mg/dL — ABNORMAL HIGH (ref 65–99)

## 2015-11-27 LAB — CBC
HEMATOCRIT: 30 % — AB (ref 35.0–47.0)
Hemoglobin: 10 g/dL — ABNORMAL LOW (ref 12.0–16.0)
MCH: 30.9 pg (ref 26.0–34.0)
MCHC: 33.5 g/dL (ref 32.0–36.0)
MCV: 92.2 fL (ref 80.0–100.0)
Platelets: 187 10*3/uL (ref 150–440)
RBC: 3.25 MIL/uL — ABNORMAL LOW (ref 3.80–5.20)
RDW: 13.9 % (ref 11.5–14.5)
WBC: 6.8 10*3/uL (ref 3.6–11.0)

## 2015-11-27 LAB — BASIC METABOLIC PANEL
Anion gap: 6 (ref 5–15)
BUN: 19 mg/dL (ref 6–20)
CALCIUM: 8 mg/dL — AB (ref 8.9–10.3)
CO2: 26 mmol/L (ref 22–32)
CREATININE: 1.23 mg/dL — AB (ref 0.44–1.00)
Chloride: 107 mmol/L (ref 101–111)
GFR calc Af Amer: 49 mL/min — ABNORMAL LOW (ref >60)
GFR calc non Af Amer: 42 mL/min — ABNORMAL LOW (ref >60)
GLUCOSE: 96 mg/dL (ref 65–99)
Potassium: 3.2 mmol/L — ABNORMAL LOW (ref 3.5–5.1)
Sodium: 139 mmol/L (ref 135–145)

## 2015-11-27 LAB — LIPID PANEL
CHOL/HDL RATIO: 3.7 ratio
Cholesterol: 181 mg/dL (ref 0–200)
HDL: 49 mg/dL (ref >40)
LDL CALC: 111 mg/dL — AB (ref 0–99)
TRIGLYCERIDES: 106 mg/dL (ref <150)
VLDL: 21 mg/dL (ref 0–40)

## 2015-11-27 LAB — FOLATE: Folate: 18.6 ng/mL (ref >5.9)

## 2015-11-27 MED ORDER — POTASSIUM CHLORIDE CRYS ER 20 MEQ PO TBCR
40.0000 meq | EXTENDED_RELEASE_TABLET | ORAL | Status: AC
  Administered 2015-11-27 (×2): 40 meq via ORAL
  Filled 2015-11-27 (×2): qty 2

## 2015-11-27 MED ORDER — SODIUM CHLORIDE 0.9 % IV SOLN
INTRAVENOUS | Status: AC
  Administered 2015-11-27 – 2015-11-28 (×2): via INTRAVENOUS

## 2015-11-27 NOTE — Clinical Social Work Note (Signed)
CSW was able to reach patient's son and extended the 2 facility bed offers. CSW explained to patient's son that once patient goes to a snf, that the snf is unable to provide sitters so CSW arranged for a room across the nurse's station. Patients' son has chosen bed at [facility]. Patient will need to be without a sitter here at the hospital for 24 hours prior to discharging to snf. Patient at this time continues to have a sitter in place. CSW contacted Amy at [facility]: [phone] and informed her of facility chosen and she will provide auth on the day of discharge. Shela Leff MSW,LCSW [phone]

## 2015-11-27 NOTE — Clinical Social Work Note (Signed)
CSW has attempted several times to try and reach patient's son but there is no answer and he is not here at the hospital visiting. CSW is trying to reach patient's son to discuss his change in decision to now pursue rehab. CSW has coordinated with two local facilities to have patient placed at their nurse's station as she is a high fall risk.CSW also spoke with patient's insurance and they would be willing to authorize her to go to SNF. Shela Leff MSW,LCSW [phone]

## 2015-11-27 NOTE — Plan of Care (Signed)
Problem: Education: Goal: Knowledge of disease or condition will improve Outcome: Not Progressing Pt is only alert to self, still very confused.  Goal: Knowledge of secondary prevention will improve Outcome: Not Progressing Pt is only alert to self, pt is still very confused.

## 2015-11-27 NOTE — Care Management (Signed)
Patient discharge disposition has changed to SNF.  CSW facilitating. Corene Cornea with [facility] notified.  RNCM singing off.  Please re consult if appropriate.

## 2015-11-27 NOTE — Clinical Social Work Note (Deleted)
CSW contacted patient's [place] guardian: [name] [name]: [phone] this morning. CSW explained to [name] [name] that she/[facility] [place] would need to take an active role in finding patient placement. [name] [name] stated she did not know where else to look. She stated that patient does not qualify for a basic ALF or group home because he does not qualify for medicaid special assistance. The cut off for medicaid special assistance is $1228.00 and his check is $1303.00. She stated that there were two group homes in [facility]: [facility] and [facility] that were willing to take him for the medicaid base rate of $1182.00 but now neither of them will accept him back due to aggressive behavior. [name] [name] stated that patient does qualify to receive care in a special care unit (which would be either a locked unit in an alf or SNF). CSW inquired if she had contacted [facility] or [facility] in [place] as they both have locked units and she stated she would call them right after she go off the phone with me. [name] [name] stated she knew of nothing else. [name] [name] MSW,LCSW [phone]

## 2015-11-27 NOTE — Progress Notes (Signed)
Patient's sitter order was discontinued per MD verbal order will monitor patient closely

## 2015-11-27 NOTE — Plan of Care (Signed)
Problem: Nutrition: Goal: Adequate nutrition will be maintained Outcome: Progressing Educated the patient on the need to have adequate nutritional intake, she does not show understanding

## 2015-11-27 NOTE — Progress Notes (Signed)
Patient's  Order for sitter was discontinued so that monitor can be placed but monitoring unit not available at that time so order to reinstate bedside sitter was obtained from Dr Margaretmary Eddy till tomorrow morning.

## 2015-11-27 NOTE — Progress Notes (Signed)
[place] [facility] NAME: [name] [name]    MR#:  [id number]  DATE OF BIRTH:  Mar 26, 1940  SUBJECTIVE:  CHIEF COMPLAINT:  Pt is OOB to chair , Ambulating in the hallway with aide, still confused today according to the staff report.   REVIEW OF SYSTEMS:  Ros-Altered mental status   DRUG ALLERGIES:   Allergies  Allergen Reactions  . Contrast Media [Iodinated Diagnostic Agents] Other (See Comments)    Pt was sent to the ED following contrast media injection at [place]. Unknown reason. She has been premedicated since without complications. Pt to be premedicated prior to contrast media injections  . Ioxaglate Other (See Comments)    Pt was sent to the ED following contrast media injection at [place]. Unknown reason. She has been premedicated since without complications. Pt to be premedicated prior to contrast media injections  . Sulfa Antibiotics     Other reaction(s): Other (See Comments)  . Tetracycline Hives and Other (See Comments)  . White Petrolatum Other (See Comments)  . Amoxicillin-Pot Clavulanate Rash    Blisters in mouth Other reaction(s): Unknown Blisters in mouth Blisters in mouth  . Tape Rash    VITALS:  Blood pressure (!) 123/50, pulse 87, temperature 97.8 F (36.6 C), temperature source Oral, resp. rate 16, height '5\' 5"'$  (1.651 m), weight 56.2 kg (124 lb), SpO2 99 %.  PHYSICAL EXAMINATION:  GENERAL:  75 y.o.-year-old patient lying in the bed with no acute distress.  EYES: Pupils equal, round, reactive to light and accommodation. No scleral icterus.  HEENT: Head atraumatic, normocephalic. Oropharynx and nasopharynx clear.  NECK:  Supple, no jugular venous distention. No thyroid enlargement, no tenderness.  LUNGS: Normal breath sounds bilaterally, no wheezing, rales,rhonchi or crepitation. No use of accessory muscles of respiration.  CARDIOVASCULAR: S1, S2 normal. No murmurs, rubs, or gallops.   ABDOMEN: Soft, nontender, nondistended. Bowel sounds present. No organomegaly or mass.  EXTREMITIES: No pedal edema, cyanosis, or clubbing.  NEUROLOGIC: pleasantly altered. Sensation intact. Gait not checked.  PSYCHIATRIC: The patient is alert and oriented 1-2 SKIN: No obvious rash, lesion, or ulcer.    LABORATORY PANEL:   CBC  Recent Labs Lab 11/27/15 0610  WBC 6.8  HGB 10.0*  HCT 30.0*  PLT 187   ------------------------------------------------------------------------------------------------------------------  Chemistries   Recent Labs Lab 11/27/15 0610  NA 139  K 3.2*  CL 107  CO2 26  GLUCOSE 96  BUN 19  CREATININE 1.23*  CALCIUM 8.0*   ------------------------------------------------------------------------------------------------------------------  Cardiac Enzymes No results for input(s): TROPONINI in the last 168 hours. ------------------------------------------------------------------------------------------------------------------  RADIOLOGY:  Mr Brain 64 Contrast  Result Date: 11/26/2015 CLINICAL DATA:  75 year old female with altered mental status, sepsis. Initial encounter. EXAM: MRI HEAD WITHOUT CONTRAST TECHNIQUE: Multiplanar, multiecho pulse sequences of the brain and surrounding structures were obtained without intravenous contrast. COMPARISON:  Head CT without contrast 11/22/2015 and earlier. Brain MRI 12/20/2014. FINDINGS: Brain: Small 15 mm linear area of restricted diffusion in the right cerebellar hemisphere has associated T2 and mild FLAIR hyperintensity with no hemorrhage or mass effect. No other restricted diffusion. Elsewhere stable gray and white matter signal since 2016, including minimal to mild for age nonspecific cerebral white matter and right pontine signal changes. No cortical encephalomalacia or chronic cerebral blood products identified. No midline shift, mass effect, evidence of mass lesion, ventriculomegaly, extra-axial collection or  acute intracranial hemorrhage. Cervicomedullary junction and pituitary are within normal limits. Vascular: Major intracranial vascular flow voids  are stable since 2016, the distal left vertebral artery appears dominant. Skull and upper cervical spine: Negative. Normal bone marrow signal. Sinuses/Orbits: Negative orbits soft tissues. Stable paranasal sinuses status post surgery on the right side. Other: Minimal mastoid fluid is stable. Decreased retained secretions in the nasopharynx. Negative scalp soft tissues. IMPRESSION: 1. Acute lacunar type infarct in the right cerebellum, right PICA territory. No associated hemorrhage or mass effect. 2. Otherwise stable and largely unremarkable for age noncontrast MRI appearance of the brain since 2016. Electronically Signed   By: Genevie Ann M.D.   On: 11/26/2015 14:42   US Carotid Bilateral  Result Date: 11/26/2015 CLINICAL DATA:  CVA.  History of hypertension and smoking. EXAM: BILATERAL CAROTID DUPLEX ULTRASOUND TECHNIQUE: Pearline Cables scale imaging, color Doppler and duplex ultrasound were performed of bilateral carotid and vertebral arteries in the neck. COMPARISON:  Brain MRI -earlier same day FINDINGS: Criteria: Quantification of carotid stenosis is based on velocity parameters that correlate the residual internal carotid diameter with NASCET-based stenosis levels, using the diameter of the distal internal carotid lumen as the denominator for stenosis measurement. The following velocity measurements were obtained: RIGHT ICA:  71/14 cm/sec CCA:  53/61 cm/sec SYSTOLIC ICA/CCA RATIO:  1.0 DIASTOLIC ICA/CCA RATIO:  1.1 ECA:  137 cm/sec LEFT ICA:  68/10 cm/sec CCA:  44/31 cm/sec SYSTOLIC ICA/CCA RATIO:  1.2 DIASTOLIC ICA/CCA RATIO:  1.7 ECA:  127 cm/sec RIGHT CAROTID ARTERY: There is a moderate to large amount of eccentric mixed echogenic partially shadowing plaque within the right carotid bulb (images 10 through 12), extending to involve the origin and proximal aspects of the  right internal carotid artery (image 18), not resulting in elevated peak systolic velocities within the interrogated course the right internal carotid artery to suggest a hemodynamically significant stenosis. RIGHT VERTEBRAL ARTERY:  Antegrade Flow LEFT CAROTID ARTERY: There is a minimal amount of eccentric mixed echogenic plaque within the left carotid bulb (image 39), extending to involve the origin and proximal aspects of the left internal carotid artery (image 46), not resulting in elevated peak systolic velocities within the interrogated course the left internal carotid artery to suggest a hemodynamically significant stenosis. LEFT VERTEBRAL ARTERY:  Antegrade Flow IMPRESSION: Minimal to moderate amount of bilateral atherosclerotic plaque, right greater than left, not resulting in a hemodynamically significant stenosis within either internal carotid artery. Electronically Signed   By: Sandi Mariscal M.D.   On: 11/26/2015 17:00    EKG:   Orders placed or performed during the hospital encounter of 11/14/15  . ED EKG 12-Lead  . ED EKG 12-Lead  . EKG 12-Lead  . EKG 12-Lead  . EKG 12-Lead  . EKG 12-Lead    ASSESSMENT AND PLAN:   [name] 73-year-old female with past medical history significant for COPD/asthma on home oxygen noncompliant, fibromyalgia, hypertension, neuropathy presented to hospital secondary to acute hypoxic respiratory failure    # Acute CVA    -CT head is negative.  MRI of the brain-acute right cerebellar lacunar infarct in rt PICA  carotid Dopplers- min plaque Bedside swallow evaluation- passed- resume Dysphagia 3 diet - lipid panel- LDL -111 A1c and TSH - Continue Aspirin and statin  -Patient was already seen by physical therapy recommended skilled nursing facility Echocardiogram with 55-60% ejection fraction performed on 11/15/15 -Neurology consult by Dr. Doy Mince. Appreciate her recommendations -risperidone at night Appreciate psychiatry recommendations - Patient has  completed prednisone tapering  Ammonia, b12, tsh normal  # Acute hypoxic respiratory failure-secondary to COPD exacerbation and pneumonia on  admission. -Went into flash pulmonary edema and received Lasix and BiPAP on 11/19/15. -Echocardiogram with EF 21% and diastolic dysfunction. Lasix discontinued. -On admission, patient failed BiPAP requiring intubation and extubated a fewdays ago - Rocephin and azithromycin for pneumonia. Finished antibiotics -  Repeat chest x-ray showing only atelectasis. -Continue steroids - on prednisone oral taper. inhaler/nebulizer for her COPD.  # acute delirium-after extubation. 2/2 acute cva  Improving slowly. -CT head is negative. MRI of the brain with CVA -EEG -nml -Neurology consult appreciated -Added risperidone at night Appreciate psychiatry recommendations - Patient has completed prednisone tapering  Ammonia, b12, tsh normal    #3 sepsis-secondary to pneumonia on admission. Resolved  #4 Hypokalemia-replaced.  #5 DVT prophylaxis-on subcutaneous Lovenox  PRN sitter  Skilled nursing facility recommended by physical therapy     All the records are reviewed and case discussed with Care Management/Social Workerr. Management plans discussed with the patient's son Ronalee Belts via phone and he is agreeable with SNF placement given the new diagnosis of CVA   CODE STATUS:fc,son hcpoa  TOTAL TIME TAKING CARE OF THIS PATIENT: 34 minutes.   POSSIBLE D/C IN am DAYS, DEPENDING ON CLINICAL CONDITION.  Note: This dictation was prepared with Dragon dictation along with smaller phrase technology. Any transcriptional errors that result from this process are unintentional.   Nicholes Mango M.D on 11/27/2015 at 6:54 PM  Between 7am to 6pm - Pager - [phone] After 6pm go to [email or web address] - password [email or web address] [facility]  Office  [phone]  CC: Primary care physician; Tracie Harrier, MD

## 2015-11-28 LAB — CBC
HEMATOCRIT: 29.5 % — AB (ref 35.0–47.0)
Hemoglobin: 9.7 g/dL — ABNORMAL LOW (ref 12.0–16.0)
MCH: 30.8 pg (ref 26.0–34.0)
MCHC: 32.8 g/dL (ref 32.0–36.0)
MCV: 93.8 fL (ref 80.0–100.0)
Platelets: 194 10*3/uL (ref 150–440)
RBC: 3.14 MIL/uL — AB (ref 3.80–5.20)
RDW: 13.9 % (ref 11.5–14.5)
WBC: 7.2 10*3/uL (ref 3.6–11.0)

## 2015-11-28 LAB — BASIC METABOLIC PANEL
ANION GAP: 3 — AB (ref 5–15)
BUN: 14 mg/dL (ref 6–20)
CHLORIDE: 115 mmol/L — AB (ref 101–111)
CO2: 23 mmol/L (ref 22–32)
Calcium: 7.8 mg/dL — ABNORMAL LOW (ref 8.9–10.3)
Creatinine, Ser: 0.99 mg/dL (ref 0.44–1.00)
GFR, EST NON AFRICAN AMERICAN: 55 mL/min — AB (ref >60)
Glucose, Bld: 99 mg/dL (ref 65–99)
POTASSIUM: 4.4 mmol/L (ref 3.5–5.1)
SODIUM: 141 mmol/L (ref 135–145)

## 2015-11-28 LAB — GLUCOSE, CAPILLARY
GLUCOSE-CAPILLARY: 146 mg/dL — AB (ref 65–99)
GLUCOSE-CAPILLARY: 94 mg/dL (ref 65–99)
Glucose-Capillary: 104 mg/dL — ABNORMAL HIGH (ref 65–99)

## 2015-11-28 LAB — HEMOGLOBIN A1C
Hgb A1c MFr Bld: 5.9 % — ABNORMAL HIGH (ref 4.8–5.6)
Mean Plasma Glucose: 123 mg/dL

## 2015-11-28 MED ORDER — ATORVASTATIN CALCIUM 20 MG PO TABS
40.0000 mg | ORAL_TABLET | Freq: Every day | ORAL | Status: DC
  Administered 2015-11-28: 40 mg via ORAL
  Filled 2015-11-28: qty 2

## 2015-11-28 NOTE — Clinical Social Work Note (Signed)
MSW spoke to [facility] [place] to explain that patient's sitter was discontinued this morning.  SNF said they can accept patient once she has been without a sitter for 24 hours.  MSW to continue to monitor patient's progress throughout discharge planning.  Jones Broom. Norval Morton, MSW [phone]  Mon-Fri 8a-4:30p 11/28/2015 12:26 PM

## 2015-11-28 NOTE — Consult Note (Signed)
[facility] Consult   Reason for Consult:  Patient seen for follow-up from a history of confusion and delirium in the hospital Referring Physician:  Gour[place] Patient Identification: [name] [name] MRN:  [id number] Principal Diagnosis: Altered mental status Diagnosis:   Patient Active Problem List   Diagnosis Date Noted  . Malnutrition of moderate degree [E44.0] 11/24/2015  . Acute respiratory failure ([place]) [J96.00]   . Hyponatremia [E87.1]   . CAP (community acquired pneumonia) [J18.9] 11/15/2015  . Hypotension [I95.9] 11/15/2015  . AKI (acute kidney injury) ([place]) [N17.9] 11/15/2015  . COPD (chronic obstructive pulmonary disease) ([place]) [J44.9] 11/15/2015  . Chronic systolic CHF (congestive heart failure) ([place]) [I50.22] 11/15/2015  . Sepsis ([place]) [A41.9] 11/15/2015  . Altered mental status [R41.82]   . Personal history of tobacco use, presenting hazards to health [Z87.891] 05/17/2015    Total Time spent with patient: 20 minutes  Subjective:   [name] [name] is a 75 y.o. female patient admitted with "I don't know".  HPI:  This a 75 year old woman seen for follow-up. She's been in the hospital for a fairly extended time with multiple medical problems. Now pending discharged to a rehabilitation facility. Patient had no specific complaints today. She is confused. Doesn't know where she is. Doesn't have a clear idea what's going on with her. Short-term memory clearly impaired. On the other hand patient appeared to be pleasant and had no specific complaints. Affect euthymic.  Past Psychiatric History: Reported history of at least some degree of dementia although the baseline is not clear  Risk to Self: Is patient at risk for suicide?: No Risk to Others:   Prior Inpatient Therapy:   Prior Outpatient Therapy:    Past Medical History:  Past Medical History:  Diagnosis Date  . Asthma   . Breast cancer ([place]) 2009   left  . Cancer ([place])   . CHF (congestive heart failure)  ([place])   . Chronic UTI   . COPD (chronic obstructive pulmonary disease) ([place])   . Dizziness   . Fibromyalgia   . Hypertension   . Neuropathy ([place])   . Personal history of tobacco use, presenting hazards to health 05/17/2015  . Polyp, larynx   . RA (rheumatoid arthritis) ([place])   . Sinus infection    recent  . Stumbling gait    to the left  . Supplemental oxygen dependent    2.5l    Past Surgical History:  Procedure Laterality Date  . ABDOMINAL HYSTERECTOMY    . BREAST LUMPECTOMY Left 2009   chemo and radiation  . CYST EXCISION Left 02/27/2015   Procedure: CYST REMOVAL;  Surgeon: Hessie Knows, MD;  Location: [facility] ORS;  Service: Orthopedics;  Laterality: Left;  . EYE MUSCLE SURGERY Right    13 surgeries  . THUMB ARTHROSCOPY Left    Family History:  Family History  Problem Relation Age of Onset  . Diabetes Father   . Stroke Father   . Heart attack Father   . CAD Sister    Family Psychiatric  History: Unknown Social History:  History  Alcohol Use No     History  Drug Use No    Social History   Social History  . Marital status: Divorced    Spouse name: N/A  . Number of children: N/A  . Years of education: N/A   Social History Main Topics  . Smoking status: Current Every Day Smoker    Packs/day: 0.75    Years: 40.00    Types: Cigarettes  .  Smokeless tobacco: Never Used  . Alcohol use No  . Drug use: No  . Sexual activity: Not Asked   Other Topics Concern  . None   Social History Narrative  . None   Additional Social History:    Allergies:   Allergies  Allergen Reactions  . Contrast Media [Iodinated Diagnostic Agents] Other (See Comments)    Pt was sent to the ED following contrast media injection at [place]. Unknown reason. She has been premedicated since without complications. Pt to be premedicated prior to contrast media injections  . Ioxaglate Other (See Comments)    Pt was sent to the ED following contrast media injection at [place]. Unknown reason. She has been premedicated since without complications. Pt to be premedicated prior to contrast media injections  . Sulfa Antibiotics     Other reaction(s): Other (See Comments)  . Tetracycline Hives and Other (See Comments)  . White Petrolatum Other (See Comments)  . Amoxicillin-Pot Clavulanate Rash    Blisters in mouth Other reaction(s): Unknown Blisters in mouth Blisters in mouth  . Tape Rash    Labs:  Results for orders placed or performed during the hospital encounter of 11/14/15 (from the past 48 hour(s))  Glucose, capillary     Status: None   Collection Time: 11/26/15  5:00 PM  Result Value Ref Range   Glucose-Capillary 87 65 - 99 mg/dL  Glucose, capillary     Status: Abnormal   Collection Time: 11/26/15  8:23 PM  Result Value Ref Range   Glucose-Capillary 140 (H) 65 - 99 mg/dL  CBC     Status: Abnormal   Collection Time: 11/27/15  6:10 AM  Result Value Ref Range   WBC 6.8 3.6 - 11.0 K/uL   RBC 3.25 (L) 3.80 - 5.20 MIL/uL   Hemoglobin 10.0 (L) 12.0 - 16.0 g/dL   HCT 30.0 (L) 35.0 - 47.0 %   MCV 92.2 80.0 - 100.0 fL   MCH 30.9 26.0 - 34.0 pg   MCHC 33.5 32.0 - 36.0 g/dL   RDW 13.9 11.5 - 14.5 %   Platelets 187 150 - 440 K/uL  Basic metabolic panel     Status: Abnormal   Collection Time: 11/27/15  6:10 AM  Result Value Ref Range   Sodium 139 135 - 145 mmol/L   Potassium 3.2 (L) 3.5 - 5.1 mmol/L   Chloride 107 101 - 111 mmol/L   CO2 26 22 - 32 mmol/L   Glucose, Bld 96 65 - 99 mg/dL   BUN 19 6 - 20 mg/dL   Creatinine, Ser 1.23 (H) 0.44 - 1.00 mg/dL   Calcium 8.0 (L) 8.9 - 10.3 mg/dL   GFR calc non Af Amer 42 (L) >60 mL/min   GFR calc Af Amer 49 (L) >60 mL/min    Comment: (NOTE) The eGFR has been calculated using the CKD EPI equation. This calculation has not been validated in all clinical situations. eGFR's persistently <60 mL/min signify possible Chronic Kidney Disease.    Anion gap 6 5 - 15  Lipid panel     Status: Abnormal    Collection Time: 11/27/15  6:10 AM  Result Value Ref Range   Cholesterol 181 0 - 200 mg/dL   Triglycerides 106 <150 mg/dL   HDL 49 >40 mg/dL   Total CHOL/HDL Ratio 3.7 RATIO   VLDL 21 0 - 40 mg/dL   LDL Cholesterol 111 (H) 0 - 99 mg/dL    Comment:  Total Cholesterol/HDL:CHD Risk Coronary Heart Disease Risk Table                     Men   Women  1/2 Average Risk   3.4   3.3  Average Risk       5.0   4.4  2 X Average Risk   9.6   7.1  3 X Average Risk  23.4   11.0        Use the calculated Patient Ratio above and the CHD Risk Table to determine the patient's CHD Risk.        ATP III CLASSIFICATION (LDL):  <100     mg/dL   Optimal  100-129  mg/dL   Near or Above                    Optimal  130-159  mg/dL   Borderline  160-189  mg/dL   High  >190     mg/dL   Very High   Hemoglobin A1c     Status: Abnormal   Collection Time: 11/27/15  6:10 AM  Result Value Ref Range   Hgb A1c MFr Bld 5.9 (H) 4.8 - 5.6 %    Comment: (NOTE)         Pre-diabetes: 5.7 - 6.4         Diabetes: >6.4         Glycemic control for adults with diabetes: <7.0    Mean Plasma Glucose 123 mg/dL    Comment: (NOTE) Performed At: [facility] [place], [place] [id number] Lindon Romp MD [id number]   Folate     Status: None   Collection Time: 11/27/15  6:10 AM  Result Value Ref Range   Folate 18.6 >5.9 ng/mL  Glucose, capillary     Status: None   Collection Time: 11/27/15  7:37 AM  Result Value Ref Range   Glucose-Capillary 92 65 - 99 mg/dL  Glucose, capillary     Status: Abnormal   Collection Time: 11/27/15 11:37 AM  Result Value Ref Range   Glucose-Capillary 104 (H) 65 - 99 mg/dL  Glucose, capillary     Status: None   Collection Time: 11/27/15  4:49 PM  Result Value Ref Range   Glucose-Capillary 92 65 - 99 mg/dL  Glucose, capillary     Status: Abnormal   Collection Time: 11/27/15 10:20 PM  Result Value Ref Range   Glucose-Capillary 139 (H) 65 - 99 mg/dL   Basic metabolic panel     Status: Abnormal   Collection Time: 11/28/15  5:40 AM  Result Value Ref Range   Sodium 141 135 - 145 mmol/L   Potassium 4.4 3.5 - 5.1 mmol/L   Chloride 115 (H) 101 - 111 mmol/L   CO2 23 22 - 32 mmol/L   Glucose, Bld 99 65 - 99 mg/dL   BUN 14 6 - 20 mg/dL   Creatinine, Ser 0.99 0.44 - 1.00 mg/dL   Calcium 7.8 (L) 8.9 - 10.3 mg/dL   GFR calc non Af Amer 55 (L) >60 mL/min   GFR calc Af Amer >60 >60 mL/min    Comment: (NOTE) The eGFR has been calculated using the CKD EPI equation. This calculation has not been validated in all clinical situations. eGFR's persistently <60 mL/min signify possible Chronic Kidney Disease.    Anion gap 3 (L) 5 - 15  CBC     Status: Abnormal   Collection Time: 11/28/15  5:40  AM  Result Value Ref Range   WBC 7.2 3.6 - 11.0 K/uL   RBC 3.14 (L) 3.80 - 5.20 MIL/uL   Hemoglobin 9.7 (L) 12.0 - 16.0 g/dL   HCT 29.5 (L) 35.0 - 47.0 %   MCV 93.8 80.0 - 100.0 fL   MCH 30.8 26.0 - 34.0 pg   MCHC 32.8 32.0 - 36.0 g/dL   RDW 13.9 11.5 - 14.5 %   Platelets 194 150 - 440 K/uL  Glucose, capillary     Status: Abnormal   Collection Time: 11/28/15  7:40 AM  Result Value Ref Range   Glucose-Capillary 104 (H) 65 - 99 mg/dL  Glucose, capillary     Status: Abnormal   Collection Time: 11/28/15 11:27 AM  Result Value Ref Range   Glucose-Capillary 146 (H) 65 - 99 mg/dL    Current Facility-Administered Medications  Medication Dose Route Frequency Provider Last Rate Last Dose  . acetaminophen (TYLENOL) tablet 650 mg  650 mg Per Tube Q6H PRN Wilhelmina Mcardle, MD   650 mg at 11/27/15 0128   Or  . acetaminophen (TYLENOL) suppository 650 mg  650 mg Rectal Q6H PRN Wilhelmina Mcardle, MD      . albuterol (PROVENTIL) (2.5 MG/3ML) 0.083% nebulizer solution 2.5 mg  2.5 mg Nebulization Q3H PRN Wilhelmina Mcardle, MD      . aspirin EC tablet 325 mg  325 mg Oral Daily Nicholes Mango, MD   325 mg at 11/28/15 1105  . atorvastatin (LIPITOR) tablet 40 mg  40 mg Oral  q1800 Nicholes Mango, MD      . bisacodyl (DULCOLAX) suppository 10 mg  10 mg Rectal Daily PRN Gladstone Lighter, MD      . budesonide (PULMICORT) nebulizer solution 0.5 mg  0.5 mg Nebulization BID Napoleon Form, RPH   0.5 mg at 11/27/15 0756  . carvedilol (COREG) tablet 12.5 mg  12.5 mg Oral BID WC Gladstone Lighter, MD   12.5 mg at 11/28/15 0831  . chlorpheniramine-HYDROcodone (TUSSIONEX) 10-8 MG/5ML suspension 5 mL  5 mL Oral Q12H Gladstone Lighter, MD   5 mL at 11/28/15 1106  . clonazePAM (KLONOPIN) tablet 1 mg  1 mg Oral BID Bincy S Varughese, NP   1 mg at 11/28/15 1105  . enoxaparin (LOVENOX) injection 40 mg  40 mg Subcutaneous Q24H Vishal Mungal, MD   40 mg at 11/27/15 2223  . famotidine (PEPCID) tablet 20 mg  20 mg Oral Daily Sheema M Hallaji, RPH   20 mg at 11/28/15 1107  . feeding supplement (BOOST / RESOURCE BREEZE) liquid 1 Container  1 Container Oral BID BM Srikar Sudini, MD   237 mL at 11/28/15 1116  . feeding supplement (ENSURE ENLIVE) (ENSURE ENLIVE) liquid 237 mL  237 mL Oral Q1500 Srikar Sudini, MD   237 mL at 11/27/15 1450  . haloperidol lactate (HALDOL) injection 0.5 mg  0.5 mg Intravenous Q6H PRN Gonzella Lex, MD   0.5 mg at 11/27/15 1443  . haloperidol lactate (HALDOL) injection 1 mg  1 mg Intravenous Q6H PRN Hillary Bow, MD   1 mg at 11/23/15 1610  . insulin aspart (novoLOG) injection 0-9 Units  0-9 Units Subcutaneous TID WC Gladstone Lighter, MD   2 Units at 11/26/15 1303  . ipratropium-albuterol (DUONEB) 0.5-2.5 (3) MG/3ML nebulizer solution 3 mL  3 mL Nebulization BID Nicholes Mango, MD   3 mL at 11/27/15 0756  . LORazepam (ATIVAN) injection 0.5 mg  0.5 mg Intravenous Q6H PRN Bincy S  Varughese, NP   0.5 mg at 11/25/15 1612  . ondansetron (ZOFRAN) tablet 4 mg  4 mg Oral Q6H PRN Lance Coon, MD       Or  . ondansetron A [place]) injection 4 mg  4 mg Intravenous Q6H PRN Lance Coon, MD   4 mg at 11/17/15 1023  . phenol (CHLORASEPTIC) mouth spray 1 spray  1 spray Mouth/Throat  PRN Srikar Sudini, MD      . polyethylene glycol (MIRALAX / GLYCOLAX) packet 17 g  17 g Oral Daily Gladstone Lighter, MD   17 g at 11/28/15 1106  . risperiDONE (RISPERDAL M-TABS) disintegrating tablet 1 mg  1 mg Oral QHS Gladstone Lighter, MD   1 mg at 11/27/15 2223  . senna-docusate (Senokot-S) tablet 2 tablet  2 tablet Oral BID Gladstone Lighter, MD   2 tablet at 11/28/15 1105  . sodium chloride flush (NS) 0.9 % injection 3 mL  3 mL Intravenous Q12H Gladstone Lighter, MD   3 mL at 11/28/15 1000  . sodium chloride flush (NS) 0.9 % injection 3 mL  3 mL Intravenous PRN Gladstone Lighter, MD   3 mL at 11/18/15 1227    Musculoskeletal: Strength & Muscle Tone: decreased Gait & Station: unsteady Patient leans: N/A  Psychiatric Specialty Exam: Physical Exam  Psychiatric: Her affect is blunt. Her speech is delayed. She is slowed. Thought content is not paranoid. She expresses no homicidal and no suicidal ideation. She exhibits abnormal recent memory.    ROS  Blood pressure 122/67, pulse (!) 101, temperature 98.9 F (37.2 C), temperature source Oral, resp. rate (!) 22, height '5\' 5"'  (1.651 m), weight 57.6 kg (127 lb), SpO2 98 %.Body mass index is 21.13 kg/m.  General Appearance: Casual  Eye Contact:  Fair  Speech:  Slow  Volume:  Decreased  Mood:  Euthymic  Affect:  Congruent  Thought Process:  Goal Directed  Orientation:  Negative  Thought Content:  Illogical  Suicidal Thoughts:  No  Homicidal Thoughts:  No  Memory:  Immediate;   Fair Recent;   Poor Remote;   Fair  Judgement:  Impaired  Insight:  Shallow  Psychomotor Activity:  Decreased  Concentration:  Concentration: Poor  Recall:  Poor  Fund of Knowledge:  Fair  Language:  Good  Akathisia:  No  Handed:  Right  AIMS (if indicated):     Assets:  Financial Resources/Insurance Housing Resilience Social Support  ADL's:  Impaired  Cognition:  Impaired,  Mild and Moderate  Sleep:        Treatment Plan Summary: Medication  management and Plan Patient with some degree of dementia and confusion who is still reportedly according to the chart not quite at her baseline. She is not in any current distress. She is pleasant with out any specific complaints. It looks like she is still on clonazepam which she had been on as a standing medicine prior to admission. No change to that although long-term this does put her at higher risk of confusion. I'm not going to make any other changes to medicine. She seems to be under reasonably good control. Hopefully will get better as her medical problems are farther in the past and her health improves. Long-term probably some consideration should be given to gradually decreasing the benzodiazepines.  Disposition: Patient does not meet criteria for psychiatric inpatient admission. Supportive therapy provided about ongoing stressors.  Alethia Berthold, MD 11/28/2015 3:13 PM

## 2015-11-28 NOTE — Progress Notes (Signed)
[facility] [place] NAME: [name] [name]    MR#:  [id number]  DATE OF BIRTH:  12-Jan-1941  SUBJECTIVE:  CHIEF COMPLAINT:  Pt is still altered needing bed side sitter for safety  REVIEW OF SYSTEMS:  Ros-Altered mental status   DRUG ALLERGIES:   Allergies  Allergen Reactions  . Contrast Media [Iodinated Diagnostic Agents] Other (See Comments)    Pt was sent to the ED following contrast media injection at [place]. Unknown reason. She has been premedicated since without complications. Pt to be premedicated prior to contrast media injections  . Ioxaglate Other (See Comments)    Pt was sent to the ED following contrast media injection at [place]. Unknown reason. She has been premedicated since without complications. Pt to be premedicated prior to contrast media injections  . Sulfa Antibiotics     Other reaction(s): Other (See Comments)  . Tetracycline Hives and Other (See Comments)  . White Petrolatum Other (See Comments)  . Amoxicillin-Pot Clavulanate Rash    Blisters in mouth Other reaction(s): Unknown Blisters in mouth Blisters in mouth  . Tape Rash    VITALS:  Blood pressure (!) 113/40, pulse (!) 59, temperature 99 F (37.2 C), temperature source Oral, resp. rate (!) 22, height '5\' 5"'$  (1.651 m), weight 57.6 kg (127 lb), SpO2 98 %.  PHYSICAL EXAMINATION:  GENERAL:  75 y.o.-year-old patient lying in the bed with no acute distress.  EYES: Pupils equal, round, reactive to light and accommodation. No scleral icterus.  HEENT: Head atraumatic, normocephalic. Oropharynx and nasopharynx clear.  NECK:  Supple, no jugular venous distention. No thyroid enlargement, no tenderness.  LUNGS: Normal breath sounds bilaterally, no wheezing, rales,rhonchi or crepitation. No use of accessory muscles of respiration.  CARDIOVASCULAR: S1, S2 normal. No murmurs, rubs, or gallops.  ABDOMEN: Soft, nontender, nondistended. Bowel  sounds present. No organomegaly or mass.  EXTREMITIES: No pedal edema, cyanosis, or clubbing.  NEUROLOGIC: pleasantly altered. Sensation intact. Gait not checked.  PSYCHIATRIC: The patient is alert and oriented 1-2 SKIN: No obvious rash, lesion, or ulcer.    LABORATORY PANEL:   CBC  Recent Labs Lab 11/28/15 0540  WBC 7.2  HGB 9.7*  HCT 29.5*  PLT 194   ------------------------------------------------------------------------------------------------------------------  Chemistries   Recent Labs Lab 11/28/15 0540  NA 141  K 4.4  CL 115*  CO2 23  GLUCOSE 99  BUN 14  CREATININE 0.99  CALCIUM 7.8*   ------------------------------------------------------------------------------------------------------------------  Cardiac Enzymes No results for input(s): TROPONINI in the last 168 hours. ------------------------------------------------------------------------------------------------------------------  RADIOLOGY:  No results found.  EKG:   Orders placed or performed during the hospital encounter of 11/14/15  . ED EKG 12-Lead  . ED EKG 12-Lead  . EKG 12-Lead  . EKG 12-Lead  . EKG 12-Lead  . EKG 12-Lead    ASSESSMENT AND PLAN:   [name] [name] 28-year-old female with past medical history significant for COPD/asthma on home oxygen noncompliant, fibromyalgia, hypertension, neuropathy presented to hospital secondary to acute hypoxic respiratory failure    # Acute CVA    -CT head is negative.  MRI of the brain-acute right cerebellar lacunar infarct in rt PICA  carotid Dopplers- min plaque Bedside swallow evaluation- passed- resume Dysphagia 3 diet - lipid panel- LDL -111 A1c , B1 ,folate  pending - Continue Aspirin and statin  -Patient was already seen by physical therapy recommended skilled nursing facility Echocardiogram with 55-60% ejection fraction performed on 11/15/15 -Neurology consult by Dr. Doy Mince. Appreciate her  recommendations -risperidone at  night Appreciate psychiatry recommendations - Patient has completed prednisone tapering  Ammonia, b12, tsh normal  # Acute hypoxic respiratory failure-secondary to COPD exacerbation and pneumonia on admission. -Went into flash pulmonary edema and received Lasix and BiPAP on 11/19/15. -Echocardiogram with EF 51% and diastolic dysfunction. Lasix discontinued. -On admission, patient failed BiPAP requiring intubation and extubated a fewdays ago - Rocephin and azithromycin for pneumonia. Finished antibiotics -  Repeat chest x-ray showing only atelectasis. -Continue steroids - on prednisone oral taper. inhaler/nebulizer for her COPD.  # acute delirium-after extubation. 2/2 acute cva  Improving slowly. -CT head is negative. MRI of the brain with CVA -EEG -nml -Neurology consult appreciated -Added risperidone at night Appreciate psychiatry recommendations - Patient has completed prednisone tapering  Ammonia, b12, tsh normal    #3 sepsis-secondary to pneumonia on admission. Resolved  #4 Hypokalemia-replaced.  #5 DVT prophylaxis-on subcutaneous Lovenox  PRN sitter  Skilled nursing facility recommended by physical therapy. Son is agreeable     All the records are reviewed and case discussed with Care Management/Social Workerr. Management plans discussed with the patient's son [name] [name] over phone and he is agreeable with SNF placement given the new diagnosis of CVA   CODE STATUS:fc,son hcpoa  TOTAL TIME TAKING CARE OF THIS PATIENT: 34 minutes.   POSSIBLE D/C IN am DAYS, DEPENDING ON CLINICAL CONDITION.  Note: This dictation was prepared with Dragon dictation along with smaller phrase technology. Any transcriptional errors that result from this process are unintentional.   [name] [name] M.D on 11/28/2015 at 6:45 PM  Between 7am to 6pm - Pager - [phone] After 6pm go to [email or web address] - password [email or web address] [email or web address]  Office   [phone]  CC: Primary care physician; [name] Harrier, MD

## 2015-11-28 NOTE — Plan of Care (Signed)
Problem: Fluid Volume: Goal: Ability to maintain a balanced intake and output will improve Outcome: Progressing Educated the patient on the need to eat more so that her nutritional status will improve

## 2015-11-29 LAB — FOLATE: FOLATE: 18 ng/mL (ref >5.9)

## 2015-11-29 LAB — GLUCOSE, CAPILLARY: Glucose-Capillary: 90 mg/dL (ref 65–99)

## 2015-11-29 MED ORDER — ENSURE ENLIVE PO LIQD: 237.0000 mL | Freq: Every day | ORAL | 12 refills | Status: DC

## 2015-11-29 MED ORDER — CARVEDILOL 12.5 MG PO TABS: 12.5000 mg | ORAL_TABLET | Freq: Two times a day (BID) | ORAL | 0 refills | Status: DC

## 2015-11-29 MED ORDER — RISPERIDONE 1 MG PO TBDP: 1.0000 mg | ORAL_TABLET | Freq: Every day | ORAL | 0 refills | Status: DC

## 2015-11-29 MED ORDER — ASPIRIN 325 MG PO TBEC: 325.0000 mg | DELAYED_RELEASE_TABLET | Freq: Every day | ORAL | 0 refills | Status: DC

## 2015-11-29 MED ORDER — BOOST / RESOURCE BREEZE PO LIQD: 1.0000 | Freq: Two times a day (BID) | ORAL | 0 refills | Status: DC

## 2015-11-29 MED ORDER — ATORVASTATIN CALCIUM 40 MG PO TABS: 40.0000 mg | ORAL_TABLET | Freq: Every day | ORAL | 0 refills | Status: DC

## 2015-11-29 NOTE — Clinical Social Work Note (Signed)
Physician has completed discharge summary and discharge information has been sent to [facility]. [name] aware of discharge and will transport patient. Shela Leff MSW,LCSW [phone]

## 2015-11-29 NOTE — Discharge Summary (Signed)
[facility] [place] NAME: [name] [name]    MR#:  [id number]  DATE OF BIRTH:  21-Aug-1940  DATE OF ADMISSION:  11/14/2015   ADMITTING PHYSICIAN: Lance Coon, MD  DATE OF DISCHARGE: No discharge date for patient encounter.  PRIMARY CARE PHYSICIAN: [name],[name], MD   ADMISSION DIAGNOSIS:  Respiratory failure ([place]) [J96.90] Altered mental status, unspecified altered mental status type [R41.82] DISCHARGE DIAGNOSIS:  Principal Problem:   Altered mental status Active Problems:   CAP (community acquired pneumonia)   Hypotension   AKI (acute kidney injury) ([place])   COPD (chronic obstructive pulmonary disease) (HCC)   Chronic systolic CHF (congestive heart failure) ([place])   Sepsis ([place])   Acute respiratory failure (HCC)   Hyponatremia   Malnutrition of moderate degree  SECONDARY DIAGNOSIS:   Past Medical History:  Diagnosis Date  . Asthma   . Breast cancer ([place]) 2009   left  . Cancer ([place])   . CHF (congestive heart failure) ([place])   . Chronic UTI   . COPD (chronic obstructive pulmonary disease) ([place])   . Dizziness   . Fibromyalgia   . Hypertension   . Neuropathy ([place])   . Personal history of tobacco use, presenting hazards to health 05/17/2015  . Polyp, larynx   . RA (rheumatoid arthritis) ([place])   . Sinus infection    recent  . Stumbling gait    to the left  . Supplemental oxygen dependent    2.5l   HOSPITAL COURSE:  This is 75 year old white female with past medical history significant for CHF, COPD, hypertension, asthma who is admitted on 11/15/2015 a diagnosis of acute encephalopathy with sepsis secondary to community-acquired pneumonia. Just not be hypotensive with acute kidney injury was placed on IV fluids, IV antibiotics with Rocephin and azithromycin, IV steroids and BiPAP. Critical care consultation was obtained for possible comanagement after patient was intubated and sedated, diagnosis of acute respiratory failure  secondary to COPD exacerbation and pneumonia. She was placed on pressors for hypotension was managed in the intensive care unit. She is extubated on 11/17/2015 however was complicated by delirium post extubation requiring safety sitter, risperidone, Klonopin, when necessary Haldol. Sclerae a rapid response was called for another acute episode of respiratory failure secondary to flash pulmonary edema for which she was placed back on BiPAP. Trial of BiPAP failed and patient ended up being transferred back to the intensive care unit. She is able to be weaned down to nasal cannula. Delirium gradually improved but she remained confused and therefore CT of the brain was done and revealed an acute right cerebellar lacunar infarct in the right PICA. Neurology was consulted aspirin was ordered. Upper layers revealed minimal plaque. Echocardiogram revealed 55-60% ejection fraction Psychiatry was also consulted regarding delirium and confusion in medication regimen was optimized. However delirium was largely attributed to medical conditions including sepsis, acute respiratory failure and CVA discharge was attempted on 11/28/2015 to [facility] however she needed to be free from a safety sitter for 24 hours. Patient has been calm and cooperative without any episodes of acute agitation. Therefore she is currently medically stable for discharge to skilled nursing facility  DISCHARGE CONDITIONS:  Acute  cerebellar CVA-right PICA  Sepsis secondary to community-acquired pneumonia and COPD exacerbation Acute hypoxic respiratory failure Hypokalemia  CONSULTS OBTAINED:  Treatment Team:  Gonzella Lex, MD Leotis Pain, MD  Critical care and pulmonary medicine Neurology Dr. Doy Mince DRUG ALLERGIES:   Allergies  Allergen Reactions  .  Contrast Media [Iodinated Diagnostic Agents] Other (See Comments)    Pt was sent to the ED following contrast media injection at [place]. Unknown reason. She has been  premedicated since without complications. Pt to be premedicated prior to contrast media injections  . Ioxaglate Other (See Comments)    Pt was sent to the ED following contrast media injection at [place]. Unknown reason. She has been premedicated since without complications. Pt to be premedicated prior to contrast media injections  . Sulfa Antibiotics     Other reaction(s): Other (See Comments)  . Tetracycline Hives and Other (See Comments)  . White Petrolatum Other (See Comments)  . Amoxicillin-Pot Clavulanate Rash    Blisters in mouth Other reaction(s): Unknown Blisters in mouth Blisters in mouth  . Tape Rash   DISCHARGE MEDICATIONS:     Medication List    STOP taking these medications   zolpidem 5 MG tablet Commonly known as:  AMBIEN     TAKE these medications   albuterol 108 (90 Base) MCG/ACT inhaler Commonly known as:  PROVENTIL HFA;VENTOLIN HFA Inhale into the lungs every 6 (six) hours as needed for wheezing or shortness of breath.   albuterol-ipratropium 18-103 MCG/ACT inhaler Commonly known as:  COMBIVENT Inhale into the lungs every 4 (four) hours.   aspirin 325 MG EC tablet Take 1 tablet (325 mg total) by mouth daily. Start taking on:  11/30/2015   atorvastatin 40 MG tablet Commonly known as:  LIPITOR Take 1 tablet (40 mg total) by mouth daily at 6 PM.   budesonide-formoterol 160-4.5 MCG/ACT inhaler Commonly known as:  SYMBICORT Inhale 2 puffs into the lungs 2 (two) times daily.   Calcium + D3 600-200 MG-UNIT Tabs Take by mouth.   calcium-vitamin D 500-200 MG-UNIT tablet Commonly known as:  OSCAL WITH D Take 1 tablet by mouth at bedtime.   carvedilol 12.5 MG tablet Commonly known as:  COREG Take 1 tablet (12.5 mg total) by mouth 2 (two) times daily with a meal. What changed:  medication strength  how much to take  Another medication with the same name was removed. Continue taking this medication, and follow the directions you see  here.   clonazePAM 1 MG tablet Commonly known as:  KLONOPIN Take 1 mg by mouth 2 (two) times daily.   feeding supplement Liqd Take 1 Container by mouth 2 (two) times daily between meals.   feeding supplement (ENSURE ENLIVE) Liqd Take 237 mLs by mouth daily at 3 pm.   furosemide 20 MG tablet Commonly known as:  LASIX Take 20 mg by mouth every other day.   gabapentin 600 MG tablet Commonly known as:  NEURONTIN Take 600 mg by mouth 3 (three) times daily.   HYDROcodone-acetaminophen 5-325 MG tablet Commonly known as:  NORCO/VICODIN Take 1 tablet by mouth every 8 (eight) hours as needed for moderate pain.   HYDROcodone-acetaminophen 5-325 MG tablet Commonly known as:  NORCO Take 1 tablet by mouth every 6 (six) hours as needed for moderate pain.   imipramine 25 MG tablet Commonly known as:  TOFRANIL Take 1 tablet by mouth daily.   lisinopril 2.5 MG tablet Commonly known as:  PRINIVIL,ZESTRIL Take 2.5 mg by mouth daily.   multivitamin tablet Take by mouth.   multivitamin-iron-minerals-folic acid chewable tablet Chew 1 tablet by mouth daily.   nitroGLYCERIN 0.4 MG SL tablet Commonly known as:  NITROSTAT Place under the tongue.   promethazine 12.5 MG tablet Commonly known as:  PHENERGAN Take 12.5 mg by mouth.  risperiDONE 1 MG disintegrating tablet Commonly known as:  RISPERDAL M-TABS Take 1 tablet (1 mg total) by mouth at bedtime.   traMADol 50 MG tablet Commonly known as:  ULTRAM Take 50 mg by mouth daily.        DISCHARGE INSTRUCTIONS:  Follow-up primary care provider DIET:  Dysphagia 3 with thin fluids. Aspiration precautions DISCHARGE CONDITION:  Stable ACTIVITY:  Out of bed with assistance. OXYGEN:  No DISCHARGE LOCATION:  nursing home [facility]  If you experience worsening of your admission symptoms, develop shortness of breath, life threatening emergency, suicidal or homicidal thoughts you must seek medical attention immediately by  calling 911 or calling your MD immediately  if symptoms less severe.  You Must read complete instructions/literature along with all the possible adverse reactions/side effects for all the Medicines you take and that have been prescribed to you. Take any new Medicines after you have completely understood and accpet all the possible adverse reactions/side effects.   Please note  You were cared for by a hospitalist during your hospital stay. If you have any questions about your discharge medications or the care you received while you were in the hospital after you are discharged, you can call the unit and asked to speak with the hospitalist on call if the hospitalist that took care of you is not available. Once you are discharged, your primary care physician will handle any further medical issues. Please note that NO REFILLS for any discharge medications will be authorized once you are discharged, as it is imperative that you return to your primary care physician (or establish a relationship with a primary care physician if you do not have one) for your aftercare needs so that they can reassess your need for medications and monitor your lab values.    On the day of Discharge:  VITAL SIGNS:  Blood pressure (!) 112/45, pulse 91, temperature 98.6 F (37 C), temperature source Oral, resp. rate 18, height '5\' 5"'$  (1.651 m), weight 53.2 kg (117 lb 3.2 oz), SpO2 100 %. PHYSICAL EXAMINATION:  GENERAL:  75 y.o.-year-old patient lying in the bed with no acute distress.  EYES: Pupils equal, round, reactive to light and accommodation. No scleral icterus.  HEENT: Head atraumatic, normocephalic. Oropharynx and nasopharynx clear.  NECK:  Supple, no jugular venous distention. No thyroid enlargement, no tenderness.  LUNGS: Normal breath sounds bilaterally, no wheezing, rales,rhonchi or crepitation. No use of accessory muscles of respiration.  CARDIOVASCULAR: S1, S2 normal. No murmurs, rubs, or gallops.  ABDOMEN:  Soft, nontender, nondistended. Bowel sounds present. No organomegaly or mass.  EXTREMITIES: No pedal edema, cyanosis, or clubbing.  NEUROLOGIC: pleasantly altered. Sensation intact. Gait not checked.  PSYCHIATRIC: The patient is alert and oriented 1-2 SKIN: No obvious rash, lesion, or ulcer.  DATA REVIEW:   CBC  Recent Labs Lab 11/28/15 0540  WBC 7.2  HGB 9.7*  HCT 29.5*  PLT 194    Chemistries   Recent Labs Lab 11/28/15 0540  NA 141  K 4.4  CL 115*  CO2 23  GLUCOSE 99  BUN 14  CREATININE 0.99  CALCIUM 7.8*     Microbiology Results  Results for orders placed or performed during the hospital encounter of 11/14/15  Blood Culture (routine x 2)     Status: None   Collection Time: 11/14/15  9:31 PM  Result Value Ref Range Status   Specimen Description BLOOD LT FOREARM  Final   Special Requests BOTTLES DRAWN AEROBIC AND ANAEROBIC [place]  Final  Culture NO GROWTH 5 DAYS  Final   Report Status 11/19/2015 FINAL  Final  Blood Culture (routine x 2)     Status: None   Collection Time: 11/14/15  9:31 PM  Result Value Ref Range Status   Specimen Description BLOOD LEFT AC  Final   Special Requests BOTTLES DRAWN AEROBIC AND ANAEROBIC ANA5CC AER [place]  Final   Culture NO GROWTH 5 DAYS  Final   Report Status 11/19/2015 FINAL  Final  Urine culture     Status: None   Collection Time: 11/14/15  9:31 PM  Result Value Ref Range Status   Specimen Description URINE, RANDOM  Final   Special Requests NONE  Final   Culture NO GROWTH Performed at [facility]   Final   Report Status 11/16/2015 FINAL  Final  MRSA PCR Screening     Status: None   Collection Time: 11/15/15  2:36 AM  Result Value Ref Range Status   MRSA by PCR NEGATIVE NEGATIVE Final    Comment:        The GeneXpert MRSA Assay (FDA approved for NASAL specimens only), is one component of a comprehensive MRSA colonization surveillance program. It is not intended to diagnose MRSA infection nor to guide  or monitor treatment for MRSA infections.   Culture, respiratory (NON-Expectorated)     Status: None   Collection Time: 11/15/15 11:59 AM  Result Value Ref Range Status   Specimen Description TRACHEAL ASPIRATE  Final   Special Requests NONE  Final   Gram Stain   Final    FEW WBC PRESENT, PREDOMINANTLY PMN RARE GRAM POSITIVE COCCI IN PAIRS RARE GRAM NEGATIVE RODS FEW SQUAMOUS EPITHELIAL CELLS PRESENT    Culture   Final    Consistent with normal respiratory flora. Performed at [facility]    Report Status 11/17/2015 FINAL  Final  CULTURE, BLOOD (ROUTINE X 2) w Reflex to ID Panel     Status: None   Collection Time: 11/18/15 10:49 PM  Result Value Ref Range Status   Specimen Description BLOOD LEFT ARM  Final   Special Requests BOTTLES DRAWN AEROBIC AND ANAEROBIC 5CC  Final   Culture NO GROWTH 5 DAYS  Final   Report Status 11/23/2015 FINAL  Final  CULTURE, BLOOD (ROUTINE X 2) w Reflex to ID Panel     Status: None   Collection Time: 11/18/15 10:49 PM  Result Value Ref Range Status   Specimen Description BLOOD LEFT HAND  Final   Special Requests BOTTLES DRAWN AEROBIC AND ANAEROBIC 5CC  Final   Culture NO GROWTH 5 DAYS  Final   Report Status 11/23/2015 FINAL  Final    RADIOLOGY:  No results found.   Management plans discussed with the patient, family and they are in agreement.  CODE STATUS:     Code Status Orders        Start     Ordered   11/15/15 0232  Full code  Continuous     11/15/15 0231    Code Status History    Date Active Date Inactive Code Status Order ID Comments User Context   This patient has a current code status but no historical code status.      TOTAL TIME TAKING CARE OF THIS PATIENT: 45 minutes.    Harvie Bridge M.D on 11/29/2015 at 10:50 AM  Between 7am to 6pm - Pager - [phone]  After 6pm go to [email or web address] - [facility]  Office  [phone]  CC: Primary care physician;  Tracie Harrier, MD   Note: This dictation was prepared with Dragon dictation along with smaller phrase technology. Any transcriptional errors that result from this process are unintentional.

## 2015-11-29 NOTE — Progress Notes (Signed)
11/29/2015 3:18 PM  [name] [name] to be D/C'd Home per MD order.  Discussed prescriptions and follow up appointments with the patient. Prescriptions given to patient, medication list explained in detail. Pt verbalized understanding.    Medication List    STOP taking these medications   zolpidem 5 MG tablet Commonly known as:  AMBIEN     TAKE these medications   albuterol 108 (90 Base) MCG/ACT inhaler Commonly known as:  PROVENTIL HFA;VENTOLIN HFA Inhale into the lungs every 6 (six) hours as needed for wheezing or shortness of breath.   albuterol-ipratropium 18-103 MCG/ACT inhaler Commonly known as:  COMBIVENT Inhale into the lungs every 4 (four) hours.   aspirin 325 MG EC tablet Take 1 tablet (325 mg total) by mouth daily. Start taking on:  11/30/2015   atorvastatin 40 MG tablet Commonly known as:  LIPITOR Take 1 tablet (40 mg total) by mouth daily at 6 PM.   budesonide-formoterol 160-4.5 MCG/ACT inhaler Commonly known as:  SYMBICORT Inhale 2 puffs into the lungs 2 (two) times daily.   Calcium + D3 600-200 MG-UNIT Tabs Take by mouth.   calcium-vitamin D 500-200 MG-UNIT tablet Commonly known as:  OSCAL WITH D Take 1 tablet by mouth at bedtime.   carvedilol 12.5 MG tablet Commonly known as:  COREG Take 1 tablet (12.5 mg total) by mouth 2 (two) times daily with a meal. What changed:  medication strength  how much to take  Another medication with the same name was removed. Continue taking this medication, and follow the directions you see here.   clonazePAM 1 MG tablet Commonly known as:  KLONOPIN Take 1 mg by mouth 2 (two) times daily.   feeding supplement Liqd Take 1 Container by mouth 2 (two) times daily between meals.   feeding supplement (ENSURE ENLIVE) Liqd Take 237 mLs by mouth daily at 3 pm.   furosemide 20 MG tablet Commonly known as:  LASIX Take 20 mg by mouth every other day.   gabapentin 600 MG tablet Commonly known as:  NEURONTIN Take 600  mg by mouth 3 (three) times daily.   HYDROcodone-acetaminophen 5-325 MG tablet Commonly known as:  NORCO/VICODIN Take 1 tablet by mouth every 8 (eight) hours as needed for moderate pain.   HYDROcodone-acetaminophen 5-325 MG tablet Commonly known as:  NORCO Take 1 tablet by mouth every 6 (six) hours as needed for moderate pain.   imipramine 25 MG tablet Commonly known as:  TOFRANIL Take 1 tablet by mouth daily.   lisinopril 2.5 MG tablet Commonly known as:  PRINIVIL,ZESTRIL Take 2.5 mg by mouth daily.   multivitamin tablet Take by mouth.   multivitamin-iron-minerals-folic acid chewable tablet Chew 1 tablet by mouth daily.   nitroGLYCERIN 0.4 MG SL tablet Commonly known as:  NITROSTAT Place under the tongue.   promethazine 12.5 MG tablet Commonly known as:  PHENERGAN Take 12.5 mg by mouth.   risperiDONE 1 MG disintegrating tablet Commonly known as:  RISPERDAL M-TABS Take 1 tablet (1 mg total) by mouth at bedtime.   traMADol 50 MG tablet Commonly known as:  ULTRAM Take 50 mg by mouth daily.       Vitals:   11/28/15 2111 11/29/15 0500  BP: (!) 117/41 (!) 112/45  Pulse: 91 91  Resp: 16 18  Temp: 99.2 F (37.3 C) 98.6 F (37 C)    Skin clean, dry and intact without evidence of skin break down, no evidence of skin tears noted. IV catheter discontinued intact. Site without signs  and symptoms of complications. Dressing and pressure applied. Pt denies pain at this time. No complaints noted.  An After Visit Summary was printed and given to the patient. Patient escorted via [place], and D/C home via private auto.  [name] [name]

## 2015-11-29 NOTE — Progress Notes (Signed)
11/29/2015  2:10 PM  Called report to receiving nurse Belinda at [facility].  Gave info, answered questions, and gave my contact number if more info is needed.  Dola Argyle, RN

## 2015-11-29 NOTE — Progress Notes (Signed)
Physical Therapy Treatment Patient Details Name: [name] [name] MRN: [id number] DOB: 1940/09/20 Today's Date: 11/29/2015    History of Present Illness presented to ER secondary to AMS, SOB; admitted with sepsis related to CAP/COPD exacerbation.  Initially requiring BiPAP, subsequently intubated 11/2-11/4.  Hospital course additionally significant for intermittent agitation and return transfer to CCU 11/6 due to flash pulmonary edema; now returned to med-surg unit and on RA.  Maintaining sats >90% throughout session.    PT Comments    Pt in chair, ready to ambulate.  Pt was able to stand and ambulate around unit x 1 before reporting fatigue.  She did no use an assistive device but occasional hand held assist.  She has frequent lob's requiring min a x 1 to recover and prevent falls.  Safety awareness remains decreased due to confusion.  Pt would benefit from a rolling walker for balance but cognition interferes with proper use.  She is unsafe to transfer or ambulate without assist.    Follow Up Recommendations  SNF     Equipment Recommendations       Recommendations for Other Services       Precautions / Restrictions Precautions Precautions: Fall Restrictions Weight Bearing Restrictions: No    Mobility  Bed Mobility                  Transfers Overall transfer level: Needs assistance Equipment used: None Transfers: Sit to/from Stand Sit to Stand: Min guard            Ambulation/Gait Ambulation/Gait assistance: Min guard;Min assist Ambulation Distance (Feet): 200 Feet Assistive device: 1 person hand held assist Gait Pattern/deviations: Step-through pattern;Decreased step length - right;Decreased step length - left   Gait velocity interpretation: Below normal speed for age/gender General Gait Details: reports feeling unsteady   Stairs            Wheelchair Mobility    Modified Rankin (Stroke Patients Only)       Balance Overall balance  assessment: Needs assistance Sitting-balance support: Feet supported Sitting balance-Leahy Scale: Good     Standing balance support: Single extremity supported Standing balance-Leahy Scale: Fair                      Cognition Arousal/Alertness: Awake/alert Behavior During Therapy: WFL for tasks assessed/performed Overall Cognitive Status: Impaired/Different from baseline Area of Impairment: Safety/judgement                    Exercises      General Comments        Pertinent Vitals/Pain Pain Assessment: No/denies pain    Home Living                      Prior Function            PT Goals (current goals can now be found in the care plan section)      Frequency    Min 2X/week      PT Plan Current plan remains appropriate    Co-evaluation             End of Session Equipment Utilized During Treatment: Gait belt Activity Tolerance: Patient tolerated treatment well;Patient limited by fatigue Patient left: in chair;with call bell/phone within reach;Other (comment) (telesitter on)     Time: [id number] PT Time Calculation (min) (ACUTE ONLY): 14 min  Charges:  $Gait Training: 8-22 mins  G Codes:      Chesley Noon 11/29/2015, 10:29 AM

## 2015-11-29 NOTE — Clinical Social Work Note (Signed)
Physician to discharge patient today to [facility]. CSW has obtained authorization from [employer] thn: [id number]. CSW awaiting discharge summary from physician. Shela Leff MSW,LCSW [phone]

## 2015-11-29 NOTE — Care Management Important Message (Signed)
Important Message  Patient Details  Name: [name] [name] MRN: [id number] Date of Birth: Apr 13, 1940   Medicare Important Message Given:  Yes    Beverly Sessions, RN 11/29/2015, 10:08 AM

## 2015-11-30 LAB — HEMOGLOBIN A1C
Hgb A1c MFr Bld: 5.8 % — ABNORMAL HIGH (ref 4.8–5.6)
Mean Plasma Glucose: 120 mg/dL

## 2015-11-30 LAB — VITAMIN B1: VITAMIN B1 (THIAMINE): 66.9 nmol/L (ref 66.5–200.0)

## 2015-11-30 NOTE — Clinical Social Work Note (Signed)
CSW informed by [facility] at [facility] this morning that patient's son arrived with patient to their facility and patient refused to get out of the car. She wrapped her arm around her seatbelt and refused to get out of car so patient's son took her home. Shela Leff MSW,LCSW [phone]

## 2015-12-03 DIAGNOSIS — I639 Cerebral infarction, unspecified: Secondary | ICD-10-CM | POA: Diagnosis not present

## 2015-12-03 DIAGNOSIS — M797 Fibromyalgia: Secondary | ICD-10-CM | POA: Diagnosis not present

## 2015-12-03 DIAGNOSIS — J449 Chronic obstructive pulmonary disease, unspecified: Secondary | ICD-10-CM | POA: Diagnosis not present

## 2015-12-03 DIAGNOSIS — K1379 Other lesions of oral mucosa: Secondary | ICD-10-CM | POA: Diagnosis not present

## 2015-12-03 DIAGNOSIS — J189 Pneumonia, unspecified organism: Secondary | ICD-10-CM | POA: Diagnosis not present

## 2015-12-04 ENCOUNTER — Encounter: Payer: Self-pay | Admitting: Emergency Medicine

## 2015-12-04 ENCOUNTER — Emergency Department: Payer: Commercial Managed Care - HMO

## 2015-12-04 ENCOUNTER — Inpatient Hospital Stay
Admission: EM | Admit: 2015-12-04 | Discharge: 2015-12-07 | DRG: 682 | Disposition: A | Discharge status: Home Health | Payer: Commercial Managed Care - HMO | Attending: Internal Medicine | Admitting: Internal Medicine

## 2015-12-04 DIAGNOSIS — I130 Hypertensive heart and chronic kidney disease with heart failure and stage 1 through stage 4 chronic kidney disease, or unspecified chronic kidney disease: Secondary | ICD-10-CM | POA: Diagnosis not present

## 2015-12-04 DIAGNOSIS — E785 Hyperlipidemia, unspecified: Secondary | ICD-10-CM | POA: Diagnosis present

## 2015-12-04 DIAGNOSIS — Z881 Allergy status to other antibiotic agents status: Secondary | ICD-10-CM

## 2015-12-04 DIAGNOSIS — I129 Hypertensive chronic kidney disease with stage 1 through stage 4 chronic kidney disease, or unspecified chronic kidney disease: Secondary | ICD-10-CM | POA: Diagnosis not present

## 2015-12-04 DIAGNOSIS — J449 Chronic obstructive pulmonary disease, unspecified: Secondary | ICD-10-CM | POA: Diagnosis not present

## 2015-12-04 DIAGNOSIS — Z79899 Other long term (current) drug therapy: Secondary | ICD-10-CM | POA: Diagnosis not present

## 2015-12-04 DIAGNOSIS — Z452 Encounter for adjustment and management of vascular access device: Secondary | ICD-10-CM | POA: Diagnosis not present

## 2015-12-04 DIAGNOSIS — G9340 Encephalopathy, unspecified: Secondary | ICD-10-CM | POA: Diagnosis not present

## 2015-12-04 DIAGNOSIS — E860 Dehydration: Secondary | ICD-10-CM | POA: Diagnosis present

## 2015-12-04 DIAGNOSIS — G629 Polyneuropathy, unspecified: Secondary | ICD-10-CM | POA: Diagnosis not present

## 2015-12-04 DIAGNOSIS — N179 Acute kidney failure, unspecified: Principal | ICD-10-CM | POA: Diagnosis present

## 2015-12-04 DIAGNOSIS — N390 Urinary tract infection, site not specified: Secondary | ICD-10-CM | POA: Diagnosis present

## 2015-12-04 DIAGNOSIS — R4182 Altered mental status, unspecified: Secondary | ICD-10-CM

## 2015-12-04 DIAGNOSIS — R404 Transient alteration of awareness: Secondary | ICD-10-CM | POA: Diagnosis not present

## 2015-12-04 DIAGNOSIS — Z91048 Other nonmedicinal substance allergy status: Secondary | ICD-10-CM

## 2015-12-04 DIAGNOSIS — N189 Chronic kidney disease, unspecified: Secondary | ICD-10-CM | POA: Diagnosis present

## 2015-12-04 DIAGNOSIS — Z1612 Extended spectrum beta lactamase (ESBL) resistance: Secondary | ICD-10-CM | POA: Diagnosis present

## 2015-12-04 DIAGNOSIS — B9620 Unspecified Escherichia coli [E. coli] as the cause of diseases classified elsewhere: Secondary | ICD-10-CM | POA: Diagnosis present

## 2015-12-04 DIAGNOSIS — I248 Other forms of acute ischemic heart disease: Secondary | ICD-10-CM | POA: Diagnosis present

## 2015-12-04 DIAGNOSIS — R269 Unspecified abnormalities of gait and mobility: Secondary | ICD-10-CM | POA: Diagnosis present

## 2015-12-04 DIAGNOSIS — Z95828 Presence of other vascular implants and grafts: Secondary | ICD-10-CM

## 2015-12-04 DIAGNOSIS — E871 Hypo-osmolality and hyponatremia: Secondary | ICD-10-CM | POA: Diagnosis present

## 2015-12-04 DIAGNOSIS — I5020 Unspecified systolic (congestive) heart failure: Secondary | ICD-10-CM | POA: Diagnosis present

## 2015-12-04 DIAGNOSIS — R0902 Hypoxemia: Secondary | ICD-10-CM

## 2015-12-04 DIAGNOSIS — R748 Abnormal levels of other serum enzymes: Secondary | ICD-10-CM | POA: Diagnosis not present

## 2015-12-04 DIAGNOSIS — R0602 Shortness of breath: Secondary | ICD-10-CM

## 2015-12-04 DIAGNOSIS — Z888 Allergy status to other drugs, medicaments and biological substances status: Secondary | ICD-10-CM

## 2015-12-04 DIAGNOSIS — F17210 Nicotine dependence, cigarettes, uncomplicated: Secondary | ICD-10-CM | POA: Diagnosis present

## 2015-12-04 DIAGNOSIS — N289 Disorder of kidney and ureter, unspecified: Secondary | ICD-10-CM

## 2015-12-04 DIAGNOSIS — Z882 Allergy status to sulfonamides status: Secondary | ICD-10-CM

## 2015-12-04 DIAGNOSIS — Z91041 Radiographic dye allergy status: Secondary | ICD-10-CM | POA: Diagnosis not present

## 2015-12-04 DIAGNOSIS — I10 Essential (primary) hypertension: Secondary | ICD-10-CM | POA: Diagnosis not present

## 2015-12-04 DIAGNOSIS — M797 Fibromyalgia: Secondary | ICD-10-CM | POA: Diagnosis present

## 2015-12-04 DIAGNOSIS — Z79891 Long term (current) use of opiate analgesic: Secondary | ICD-10-CM

## 2015-12-04 DIAGNOSIS — D638 Anemia in other chronic diseases classified elsewhere: Secondary | ICD-10-CM | POA: Diagnosis present

## 2015-12-04 DIAGNOSIS — Z9981 Dependence on supplemental oxygen: Secondary | ICD-10-CM

## 2015-12-04 DIAGNOSIS — M069 Rheumatoid arthritis, unspecified: Secondary | ICD-10-CM | POA: Diagnosis present

## 2015-12-04 DIAGNOSIS — J9611 Chronic respiratory failure with hypoxia: Secondary | ICD-10-CM | POA: Diagnosis not present

## 2015-12-04 DIAGNOSIS — E876 Hypokalemia: Secondary | ICD-10-CM | POA: Diagnosis not present

## 2015-12-04 DIAGNOSIS — R410 Disorientation, unspecified: Secondary | ICD-10-CM | POA: Diagnosis not present

## 2015-12-04 DIAGNOSIS — Z880 Allergy status to penicillin: Secondary | ICD-10-CM

## 2015-12-04 LAB — COMPREHENSIVE METABOLIC PANEL
ALBUMIN: 2.9 g/dL — AB (ref 3.5–5.0)
ALK PHOS: 86 U/L (ref 38–126)
ALT: 176 U/L — AB (ref 14–54)
AST: 274 U/L — AB (ref 15–41)
Anion gap: 9 (ref 5–15)
BUN: 22 mg/dL — ABNORMAL HIGH (ref 6–20)
CALCIUM: 8.6 mg/dL — AB (ref 8.9–10.3)
CHLORIDE: 94 mmol/L — AB (ref 101–111)
CO2: 26 mmol/L (ref 22–32)
CREATININE: 2.74 mg/dL — AB (ref 0.44–1.00)
GFR calc Af Amer: 19 mL/min — ABNORMAL LOW (ref >60)
GFR calc non Af Amer: 16 mL/min — ABNORMAL LOW (ref >60)
GLUCOSE: 102 mg/dL — AB (ref 65–99)
Potassium: 4.8 mmol/L (ref 3.5–5.1)
SODIUM: 129 mmol/L — AB (ref 135–145)
Total Bilirubin: 0.5 mg/dL (ref 0.3–1.2)
Total Protein: 6.7 g/dL (ref 6.5–8.1)

## 2015-12-04 LAB — URINALYSIS COMPLETE WITH MICROSCOPIC (ARMC ONLY)
Bilirubin Urine: NEGATIVE
Glucose, UA: NEGATIVE mg/dL
HGB URINE DIPSTICK: NEGATIVE
Ketones, ur: NEGATIVE mg/dL
Nitrite: NEGATIVE
PROTEIN: 30 mg/dL — AB
SPECIFIC GRAVITY, URINE: 1.015 (ref 1.005–1.030)
SQUAMOUS EPITHELIAL / LPF: NONE SEEN
pH: 5 (ref 5.0–8.0)

## 2015-12-04 LAB — CBC WITH DIFFERENTIAL/PLATELET
BASOS PCT: 0 %
Basophils Absolute: 0 10*3/uL (ref 0–0.1)
EOS ABS: 0 10*3/uL (ref 0–0.7)
Eosinophils Relative: 0 %
HEMATOCRIT: 30.4 % — AB (ref 35.0–47.0)
HEMOGLOBIN: 10.1 g/dL — AB (ref 12.0–16.0)
LYMPHS ABS: 1.5 10*3/uL (ref 1.0–3.6)
Lymphocytes Relative: 17 %
MCH: 31.2 pg (ref 26.0–34.0)
MCHC: 33.4 g/dL (ref 32.0–36.0)
MCV: 93.6 fL (ref 80.0–100.0)
MONOS PCT: 6 %
Monocytes Absolute: 0.6 10*3/uL (ref 0.2–0.9)
NEUTROS ABS: 6.8 10*3/uL — AB (ref 1.4–6.5)
NEUTROS PCT: 77 %
Platelets: 195 10*3/uL (ref 150–440)
RBC: 3.25 MIL/uL — AB (ref 3.80–5.20)
RDW: 14.3 % (ref 11.5–14.5)
WBC: 8.9 10*3/uL (ref 3.6–11.0)

## 2015-12-04 LAB — TROPONIN I
TROPONIN I: 1.02 ng/mL — AB (ref <0.03)
Troponin I: 1.02 ng/mL (ref <0.03)

## 2015-12-04 LAB — BLOOD GAS, ARTERIAL
ACID-BASE EXCESS: 0.2 mmol/L (ref 0.0–2.0)
Bicarbonate: 26 mmol/L (ref 20.0–28.0)
FIO2: 0.28
O2 SAT: 89.9 %
PATIENT TEMPERATURE: 37
PCO2 ART: 46 mmHg (ref 32.0–48.0)
pH, Arterial: 7.36 (ref 7.350–7.450)
pO2, Arterial: 61 mmHg — ABNORMAL LOW (ref 83.0–108.0)

## 2015-12-04 LAB — SALICYLATE LEVEL

## 2015-12-04 LAB — LACTIC ACID, PLASMA
Lactic Acid, Venous: 1.3 mmol/L (ref 0.5–1.9)
Lactic Acid, Venous: 1.1 mmol/L (ref 0.5–1.9)

## 2015-12-04 LAB — ETHANOL: Alcohol, Ethyl (B): <5 mg/dL (ref <5)

## 2015-12-04 LAB — CK: CK TOTAL: 45 U/L (ref 38–234)

## 2015-12-04 LAB — ACETAMINOPHEN LEVEL

## 2015-12-04 LAB — VITAMIN B1: VITAMIN B1 (THIAMINE): 67.7 nmol/L (ref 66.5–200.0)

## 2015-12-04 LAB — BRAIN NATRIURETIC PEPTIDE: B NATRIURETIC PEPTIDE 5: 1287 pg/mL — AB (ref 0.0–100.0)

## 2015-12-04 MED ORDER — SODIUM CHLORIDE 0.9% FLUSH
3.0000 mL | Freq: Two times a day (BID) | INTRAVENOUS | Status: DC
  Administered 2015-12-05 – 2015-12-06 (×2): 3 mL via INTRAVENOUS

## 2015-12-04 MED ORDER — IMIPRAMINE HCL 25 MG PO TABS
25.0000 mg | ORAL_TABLET | Freq: Every day | ORAL | Status: DC
Start: 2015-12-04 — End: 2015-12-07
  Administered 2015-12-04 – 2015-12-07 (×4): 25 mg via ORAL
  Filled 2015-12-04 (×4): qty 1

## 2015-12-04 MED ORDER — ONDANSETRON HCL 4 MG/2ML IJ SOLN: 4.0000 mg | Freq: Four times a day (QID) | INTRAMUSCULAR | Status: DC | PRN

## 2015-12-04 MED ORDER — OXYCODONE HCL 5 MG PO TABS
5.0000 mg | ORAL_TABLET | ORAL | Status: DC | PRN
  Administered 2015-12-05 – 2015-12-07 (×5): 5 mg via ORAL
  Filled 2015-12-04 (×5): qty 1

## 2015-12-04 MED ORDER — ATORVASTATIN CALCIUM 20 MG PO TABS
40.0000 mg | ORAL_TABLET | Freq: Every day | ORAL | Status: DC
  Administered 2015-12-05 – 2015-12-06 (×2): 40 mg via ORAL
  Filled 2015-12-04 (×2): qty 2

## 2015-12-04 MED ORDER — ENSURE ENLIVE PO LIQD: 237.0000 mL | Freq: Every day | ORAL | Status: DC

## 2015-12-04 MED ORDER — ACETAMINOPHEN 325 MG PO TABS
650.0000 mg | ORAL_TABLET | Freq: Four times a day (QID) | ORAL | Status: DC | PRN
Start: 2015-12-04 — End: 2015-12-07

## 2015-12-04 MED ORDER — HEPARIN SODIUM (PORCINE) 5000 UNIT/ML IJ SOLN
5000.0000 [IU] | Freq: Three times a day (TID) | INTRAMUSCULAR | Status: DC
  Administered 2015-12-04 – 2015-12-07 (×8): 5000 [IU] via SUBCUTANEOUS
  Filled 2015-12-04 (×8): qty 1

## 2015-12-04 MED ORDER — ONDANSETRON HCL 4 MG PO TABS: 4.0000 mg | ORAL_TABLET | Freq: Four times a day (QID) | ORAL | Status: DC | PRN

## 2015-12-04 MED ORDER — SODIUM CHLORIDE 0.9 % IV BOLUS (SEPSIS)
1000.0000 mL | Freq: Once | INTRAVENOUS | Status: AC
  Administered 2015-12-04: 1000 mL via INTRAVENOUS

## 2015-12-04 MED ORDER — BOOST / RESOURCE BREEZE PO LIQD
1.0000 | Freq: Two times a day (BID) | ORAL | Status: DC
  Administered 2015-12-05 (×2): 1 via ORAL

## 2015-12-04 MED ORDER — MOMETASONE FURO-FORMOTEROL FUM 200-5 MCG/ACT IN AERO
2.0000 | INHALATION_SPRAY | Freq: Two times a day (BID) | RESPIRATORY_TRACT | Status: DC
  Administered 2015-12-04 – 2015-12-07 (×6): 2 via RESPIRATORY_TRACT
  Filled 2015-12-04: qty 8.8

## 2015-12-04 MED ORDER — TRAMADOL HCL 50 MG PO TABS: 50.0000 mg | ORAL_TABLET | Freq: Every day | ORAL | Status: DC

## 2015-12-04 MED ORDER — SODIUM CHLORIDE 0.9 % IV SOLN
INTRAVENOUS | Status: DC
  Administered 2015-12-04 – 2015-12-06 (×3): via INTRAVENOUS

## 2015-12-04 MED ORDER — GABAPENTIN 600 MG PO TABS
600.0000 mg | ORAL_TABLET | Freq: Three times a day (TID) | ORAL | Status: DC
  Administered 2015-12-04: 600 mg via ORAL
  Filled 2015-12-04 (×2): qty 1

## 2015-12-04 MED ORDER — CLONAZEPAM 0.5 MG PO TABS: 1.0000 mg | ORAL_TABLET | Freq: Two times a day (BID) | ORAL | Status: DC

## 2015-12-04 MED ORDER — ACETAMINOPHEN 650 MG RE SUPP: 650.0000 mg | Freq: Four times a day (QID) | RECTAL | Status: DC | PRN

## 2015-12-04 MED ORDER — ALBUTEROL SULFATE (2.5 MG/3ML) 0.083% IN NEBU: 2.5000 mg | INHALATION_SOLUTION | Freq: Four times a day (QID) | RESPIRATORY_TRACT | Status: DC | PRN

## 2015-12-04 MED ORDER — ALBUTEROL SULFATE HFA 108 (90 BASE) MCG/ACT IN AERS: 1.0000 | INHALATION_SPRAY | Freq: Four times a day (QID) | RESPIRATORY_TRACT | Status: DC | PRN

## 2015-12-04 MED ORDER — CARVEDILOL 6.25 MG PO TABS: 12.5000 mg | ORAL_TABLET | Freq: Two times a day (BID) | ORAL | Status: DC

## 2015-12-04 MED ORDER — ASPIRIN EC 325 MG PO TBEC: 325.0000 mg | DELAYED_RELEASE_TABLET | Freq: Every day | ORAL | Status: DC

## 2015-12-04 MED ORDER — RISPERIDONE 1 MG PO TBDP
1.0000 mg | ORAL_TABLET | Freq: Every day | ORAL | Status: DC
  Administered 2015-12-04 – 2015-12-06 (×3): 1 mg via ORAL
  Filled 2015-12-04 (×3): qty 1

## 2015-12-04 NOTE — ED Notes (Signed)
ED Provider at bedside. 

## 2015-12-04 NOTE — ED Provider Notes (Addendum)
[facility] Emergency Department Provider Note  ____________________________________________   I have reviewed the triage vital signs and the nursing notes.   HISTORY  Chief Complaint Altered Mental Status    HPI [name] [name] is a 75 y.o. female who presents today with altered mental status. Patient has a history of COPD she is mostly on home oxygen she also has a history of chronic pain and fibromyalgia, CHF, recent complicated medical admission. History is unavailable from patient. Level 5 chart caveat; no further history available due to patient status. The patient husband does give history evidence does EMS. According patient's husband, she was in her normal state of health yesterday. He came home last evening found her sitting on her floor "off her oxygen". No trauma or fall noted. The patient seemed to be doing okay.  Bed. And then this morning, he did not check on her but knew that she was breathing okay, he finally checked on her 1:00 and she was sleeping all day and found her to be minimally responsive. EMS found her with sats in the 1s and 80s. They started her back on oxygen. She was lethargic but responsive. No evidence of trauma noted for them either.  Her husband does state that she got 100 tramadol filled yesterday, and 6 were missing as of this morning. He does not feel that she was trying to kill herself.   Past Medical History:  Diagnosis Date  . Asthma   . Breast cancer ([place]) 2009   left  . Cancer ([place])   . CHF (congestive heart failure) ([place])   . Chronic UTI   . COPD (chronic obstructive pulmonary disease) ([place])   . Dizziness   . Fibromyalgia   . Hypertension   . Neuropathy ([place])   . Personal history of tobacco use, presenting hazards to health 05/17/2015  . Polyp, larynx   . RA (rheumatoid arthritis) ([place])   . Sinus infection    recent  . Stumbling gait    to the left  . Supplemental oxygen dependent    2.5l    Patient Active  Problem List   Diagnosis Date Noted  . Malnutrition of moderate degree 11/24/2015  . Acute respiratory failure ([place])   . Hyponatremia   . CAP (community acquired pneumonia) 11/15/2015  . Hypotension 11/15/2015  . AKI (acute kidney injury) ([place]) 11/15/2015  . COPD (chronic obstructive pulmonary disease) ([place]) 11/15/2015  . Chronic systolic CHF (congestive heart failure) ([place]) 11/15/2015  . Sepsis ([place]) 11/15/2015  . Altered mental status   . Personal history of tobacco use, presenting hazards to health 05/17/2015    Past Surgical History:  Procedure Laterality Date  . ABDOMINAL HYSTERECTOMY    . BREAST LUMPECTOMY Left 2009   chemo and radiation  . CYST EXCISION Left 02/27/2015   Procedure: CYST REMOVAL;  Surgeon: Hessie Knows, MD;  Location: [facility] ORS;  Service: Orthopedics;  Laterality: Left;  . EYE MUSCLE SURGERY Right    13 surgeries  . THUMB ARTHROSCOPY Left     Prior to Admission medications   Medication Sig Start Date End Date Taking? Authorizing Provider  albuterol (PROVENTIL HFA;VENTOLIN HFA) 108 (90 BASE) MCG/ACT inhaler Inhale into the lungs every 6 (six) hours as needed for wheezing or shortness of breath.    Historical Provider, MD  albuterol-ipratropium (COMBIVENT) 18-103 MCG/ACT inhaler Inhale into the lungs every 4 (four) hours.    Historical Provider, MD  aspirin 325 MG EC tablet Take 1 tablet (325 mg total)  by mouth daily. 11/30/15   Alexis Hugelmeyer, DO  atorvastatin (LIPITOR) 40 MG tablet Take 1 tablet (40 mg total) by mouth daily at 6 PM. 11/29/15   Alexis Hugelmeyer, DO  budesonide-formoterol (SYMBICORT) 160-4.5 MCG/ACT inhaler Inhale 2 puffs into the lungs 2 (two) times daily.    Historical Provider, MD  Calcium Carb-Cholecalciferol (CALCIUM + D3) 600-200 MG-UNIT TABS Take by mouth.    Historical Provider, MD  calcium-vitamin D (OSCAL WITH D) 500-200 MG-UNIT per tablet Take 1 tablet by mouth at bedtime.    Historical Provider, MD  carvedilol (COREG) 12.5 MG  tablet Take 1 tablet (12.5 mg total) by mouth 2 (two) times daily with a meal. 11/29/15   Alexis Hugelmeyer, DO  clonazePAM (KLONOPIN) 1 MG tablet Take 1 mg by mouth 2 (two) times daily.    Historical Provider, MD  feeding supplement (BOOST / RESOURCE BREEZE) LIQD Take 1 Container by mouth 2 (two) times daily between meals. 11/29/15   Alexis Hugelmeyer, DO  feeding supplement, ENSURE ENLIVE, (ENSURE ENLIVE) LIQD Take 237 mLs by mouth daily at 3 pm. 11/29/15   Alexis Hugelmeyer, DO  furosemide (LASIX) 20 MG tablet Take 20 mg by mouth every other day.    Historical Provider, MD  gabapentin (NEURONTIN) 600 MG tablet Take 600 mg by mouth 3 (three) times daily.    Historical Provider, MD  HYDROcodone-acetaminophen (NORCO) 5-325 MG tablet Take 1 tablet by mouth every 6 (six) hours as needed for moderate pain. 02/27/15   Hessie Knows, MD  HYDROcodone-acetaminophen (NORCO/VICODIN) 5-325 MG tablet Take 1 tablet by mouth every 8 (eight) hours as needed for moderate pain.    Historical Provider, MD  imipramine (TOFRANIL) 25 MG tablet Take 1 tablet by mouth daily. 09/21/15   Historical Provider, MD  lisinopril (PRINIVIL,ZESTRIL) 2.5 MG tablet Take 2.5 mg by mouth daily.    Historical Provider, MD  Multiple Vitamin (MULTIVITAMIN) tablet Take by mouth.    Historical Provider, MD  multivitamin-iron-minerals-folic acid (CENTRUM) chewable tablet Chew 1 tablet by mouth daily.    Historical Provider, MD  nitroGLYCERIN (NITROSTAT) 0.4 MG SL tablet Place under the tongue.    Historical Provider, MD  promethazine (PHENERGAN) 12.5 MG tablet Take 12.5 mg by mouth.    Historical Provider, MD  risperiDONE (RISPERDAL M-TABS) 1 MG disintegrating tablet Take 1 tablet (1 mg total) by mouth at bedtime. 11/29/15   Alexis Hugelmeyer, DO  traMADol (ULTRAM) 50 MG tablet Take 50 mg by mouth daily.    Historical Provider, MD    Allergies Contrast media [iodinated diagnostic agents]; Ioxaglate; Sulfa antibiotics; Tetracycline; White  petrolatum; Amoxicillin-pot clavulanate; and Tape  Family History  Problem Relation Age of Onset  . Diabetes Father   . Stroke Father   . Heart attack Father   . CAD Sister     Social History Social History  Substance Use Topics  . Smoking status: Current Every Day Smoker    Packs/day: 0.75    Years: 40.00    Types: Cigarettes  . Smokeless tobacco: Never Used  . Alcohol use No    Review of Systems Level 5 chart caveat; no further history available due to patient status. ____________________________________________   PHYSICAL EXAM:  VITAL SIGNS: ED Triage Vitals  Enc Vitals Group     BP 12/04/15 1539 111/62     Pulse Rate 12/04/15 1539 (!) 130     Resp 12/04/15 1539 (!) 27     Temp 12/04/15 1543 98.9 F (37.2 C)  Temp Source 12/04/15 1543 Oral     SpO2 12/04/15 1539 (!) 77 %     Weight 12/04/15 1545 118 lb 6.2 oz (53.7 kg)     Height --      Head Circumference --      Peak Flow --      Pain Score 12/04/15 1551 Asleep     Pain Loc --      Pain Edu? --      Excl. in [place]? --     Constitutional: # somnolent at but arousable will tell me her name and where she is but then goes back to sleep Eyes: Conjunctivae are normal. PERRL. EOMI. Head: Atraumatic. Nose: No congestion/rhinnorhea. Mouth/Throat: Mucous membranes are moist.  Oropharynx non-erythematous. Neck: No stridor.   Nontender with no meningismus Cardiovascular: Normal rate, regular rhythm. Grossly normal heart sounds.  Good peripheral circulation. Respiratory: Normal respiratory effort.  No retractions. Lungs CTAB. Abdominal: Soft and nontender. No distention. No guarding no rebound Back:  There is no focal tenderness or step off.  there is no midline tenderness there are no lesions noted. there is no CVA tenderness Musculoskeletal: No lower extremity tenderness, no upper extremity tenderness. No joint effusions, no DVT signs strong distal pulses no edema Neurologic:  Nonfocal  Skin:  Skin is warm, dry  and intact. No rash noted. Psychiatric: Mood and affect are normal. Speech and behavior are normal.  ____________________________________________   LABS (all labs ordered are listed, but only abnormal results are displayed)  Labs Reviewed  ACETAMINOPHEN LEVEL - Abnormal; Notable for the following:       Result Value   Acetaminophen (Tylenol), Serum <10 (*)    All other components within normal limits  COMPREHENSIVE METABOLIC PANEL - Abnormal; Notable for the following:    Sodium 129 (*)    Chloride 94 (*)    Glucose, Bld 102 (*)    BUN 22 (*)    Creatinine, Ser 2.74 (*)    Calcium 8.6 (*)    Albumin 2.9 (*)    AST 274 (*)    ALT 176 (*)    GFR calc non Af Amer 16 (*)    GFR calc Af Amer 19 (*)    All other components within normal limits  TROPONIN I - Abnormal; Notable for the following:    Troponin I 1.02 (*)    All other components within normal limits  CBC WITH DIFFERENTIAL/PLATELET - Abnormal; Notable for the following:    RBC 3.25 (*)    Hemoglobin 10.1 (*)    HCT 30.4 (*)    Neutro Abs 6.8 (*)    All other components within normal limits  BRAIN NATRIURETIC PEPTIDE - Abnormal; Notable for the following:    B Natriuretic Peptide 1,287.0 (*)    All other components within normal limits  BLOOD GAS, ARTERIAL - Abnormal; Notable for the following:    pO2, Arterial 61 (*)    All other components within normal limits  ETHANOL  LACTIC ACID, PLASMA  SALICYLATE LEVEL  LACTIC ACID, PLASMA  URINALYSIS COMPLETEWITH MICROSCOPIC ([facility] ONLY)  CK   ____________________________________________  EKG  I personally interpreted any EKGs ordered by me or triage EKG shows normal sinus rhythm rate 91 bpm, there are is new T-wave inversions in V234 and 5, these were not seen on prior Repeat EKG shows rate 86 persistent T-wave inversions symmetric and deep anteriorly and laterally. ____________________________________________  [id number]  I reviewed any imaging ordered by me or  triage that  were performed during my shift and, if possible, patient and/or family made aware of any abnormal findings. ____________________________________________   PROCEDURES  Procedure(s) performed: None  Procedures  Critical Care performed: CRITICAL CARE Performed by: Schuyler Amor   Total critical care time: 55 minutes  Critical care time was exclusive of separately billable procedures and treating other patients.  Critical care was necessary to treat or prevent imminent or life-threatening deterioration.  Critical care was time spent personally by me on the following activities: development of treatment plan with patient and/or surrogate as well as nursing, discussions with consultants, evaluation of patient's response to treatment, examination of patient, obtaining history from patient or surrogate, ordering and performing treatments and interventions, ordering and review of laboratory studies, ordering and review of radiographic studies, pulse oximetry and re-evaluation of patient's condition.   ____________________________________________   INITIAL IMPRESSION / ASSESSMENT AND PLAN / ED COURSE  Pertinent labs & imaging results that were available during my care of the patient were reviewed by me and considered in my medical decision making (see chart for details).  Patient here after taking a overdose with unknown intent. We will discuss with hospitalist whether they wish to obtain an IVC paperwork. She is somnolent but arousable. Her ABG does not show any significant acidemia, I do suspect most likely she was profound hypoxic overnight and this is why she is presenting this way. Patient also certainly did have some opiates. She already had Narcan with minimal relief from EMS. Her creatinine is significant elevated, we are checking a total CK as she did spend some time on the floor. Heart is elevated and her EKG shows new T-wave inversions this is of concern however, I did  discuss with Dr. Clayborn Bigness who feels the patient should not go emergently to the Cath Lab with these findings and I agree. We will look for other causes of the patient's symptoms although this is most likely secondary to that overdose reassuringly, her salicylate level is negative and her acetaminophen level is negative.   ----------------------------------------- 5:18 PM on 12/04/2015 -----------------------------------------  Discussed with hospitalist service and do not feel that IVC is warranted at this time. They asked me not to do it. Workup reveals many areas of considerable concern. I do suspect that likely she patient has had hypoxia leading to her apparent cardiac issues today. Cardiology as discussed does not feel she should go to the Cath Lab. We are giving her ginger IV fluid and evidence of elevated BNP and history of CHF is concerning. However, she is also suffering from acute renal failure which is likely because she was lying in bed since last night with no food or drink.    Clinical Course    ____________________________________________   FINAL CLINICAL IMPRESSION(S) / ED DIAGNOSES  Final diagnoses:  Shortness of breath      This chart was dictated using voice recognition software.  Despite best efforts to proofread,  errors can occur which can change meaning.      Schuyler Amor, MD 12/04/15 [place], MD 12/04/15 [place], MD 12/04/15 [phone]

## 2015-12-04 NOTE — H&P (Signed)
[facility] [place] NAME: [name] [name]    MR#:  [id number]  DATE OF BIRTH:  May 05, 1940   DATE OF ADMISSION:  12/04/2015  PRIMARY CARE PHYSICIAN: Tracie Harrier, MD   REQUESTING/REFERRING PHYSICIAN: [place]  CHIEF COMPLAINT:   Chief Complaint  Patient presents with  . Altered Mental Status    HISTORY OF PRESENT ILLNESS:  [name] [name]  is a 75 y.o. female with a known history of Fibromyalgia, COPD, chronic respiratory failure on 2 L nasal cannula Baseline presenting with altered mental status. Patient unable to provide meaningful information given mental status history obtained from his son who is present at bedside. They say one-day duration of "drowsiness" after she has taken 6 of her tramadol pills at once, they found her with out her oxygen placed back on though uncertain how long she was wearing it. She remained lethargic throughout the day they check on her again realizing she is not wearing her oxygen thus brought her to the Hospital further workup and evaluation given continued mental status issues  PAST MEDICAL HISTORY:   Past Medical History:  Diagnosis Date  . Asthma   . Breast cancer ([place]) 2009   left  . Cancer ([place])   . CHF (congestive heart failure) ([place])   . CHF (congestive heart failure) ([place])   . Chronic UTI   . COPD (chronic obstructive pulmonary disease) ([place])   . Dizziness   . Fibromyalgia   . Hypertension   . Neuropathy ([place])   . Personal history of tobacco use, presenting hazards to health 05/17/2015  . Polyp, larynx   . RA (rheumatoid arthritis) ([place])   . Sinus infection    recent  . Stumbling gait    to the left  . Supplemental oxygen dependent    2.5l    PAST SURGICAL HISTORY:   Past Surgical History:  Procedure Laterality Date  . ABDOMINAL HYSTERECTOMY    . BREAST LUMPECTOMY Left 2009   chemo and radiation  . CYST EXCISION Left 02/27/2015   Procedure: CYST REMOVAL;  Surgeon: Hessie Knows,  MD;  Location: [facility] ORS;  Service: Orthopedics;  Laterality: Left;  . EYE MUSCLE SURGERY Right    13 surgeries  . THUMB ARTHROSCOPY Left     SOCIAL HISTORY:   Social History  Substance Use Topics  . Smoking status: Current Every Day Smoker    Packs/day: 0.75    Years: 40.00    Types: Cigarettes  . Smokeless tobacco: Never Used  . Alcohol use No    FAMILY HISTORY:   Family History  Problem Relation Age of Onset  . Diabetes Father   . Stroke Father   . Heart attack Father   . CAD Sister     DRUG ALLERGIES:   Allergies  Allergen Reactions  . Contrast Media [Iodinated Diagnostic Agents] Other (See Comments)    Pt was sent to the ED following contrast media injection at [place]. Unknown reason. She has been premedicated since without complications. Pt to be premedicated prior to contrast media injections  . Ioxaglate Other (See Comments)    Pt was sent to the ED following contrast media injection at [place]. Unknown reason. She has been premedicated since without complications. Pt to be premedicated prior to contrast media injections  . Sulfa Antibiotics     Other reaction(s): Other (See Comments)  . Tetracycline Hives and Other (See Comments)  . White Petrolatum Other (See Comments)  . Amoxicillin-Pot Clavulanate Rash  Blisters in mouth Other reaction(s): Unknown Blisters in mouth Blisters in mouth  . Tape Rash    REVIEW OF SYSTEMS:  Unable to obtain given patient's mental status   MEDICATIONS AT HOME:   Prior to Admission medications   Medication Sig Start Date End Date Taking? Authorizing Provider  atorvastatin (LIPITOR) 40 MG tablet Take 1 tablet (40 mg total) by mouth daily at 6 PM. 11/29/15  Yes Alexis Hugelmeyer, DO  clonazePAM (KLONOPIN) 1 MG tablet Take 1 mg by mouth 2 (two) times daily.   Yes Historical Provider, MD  gabapentin (NEURONTIN) 600 MG tablet Take 600 mg by mouth 3 (three) times daily.   Yes Historical Provider, MD    traMADol (ULTRAM) 50 MG tablet Take 50 mg by mouth daily.   Yes Historical Provider, MD  aspirin 325 MG EC tablet Take 1 tablet (325 mg total) by mouth daily. Patient not taking: Reported on 12/04/2015 11/30/15   Alexis Hugelmeyer, DO  carvedilol (COREG) 12.5 MG tablet Take 1 tablet (12.5 mg total) by mouth 2 (two) times daily with a meal. 11/29/15   Alexis Hugelmeyer, DO  furosemide (LASIX) 20 MG tablet Take 20 mg by mouth every other day.    Historical Provider, MD  imipramine (TOFRANIL) 25 MG tablet Take 1 tablet by mouth daily. 09/21/15   Historical Provider, MD  lisinopril (PRINIVIL,ZESTRIL) 2.5 MG tablet Take 2.5 mg by mouth daily.    Historical Provider, MD  risperiDONE (RISPERDAL M-TABS) 1 MG disintegrating tablet Take 1 tablet (1 mg total) by mouth at bedtime. 11/29/15   Alexis Hugelmeyer, DO      VITAL SIGNS:  Blood pressure (!) 96/48, pulse 85, temperature 98.9 F (37.2 C), temperature source Oral, resp. rate 19, weight 53.7 kg (118 lb 6.2 oz), SpO2 99 %.  PHYSICAL EXAMINATION:   VITAL SIGNS: Vitals:   12/04/15 1700 12/04/15 1730  BP: (!) 93/49 (!) 96/48  Pulse: 84 85  Resp: 17 19  Temp:     GENERAL:75 y.o.female moderate distress given mental status.  HEAD: Normocephalic, atraumatic.  EYES: Pupils equal, round, reactive to light. Unable to assess extraocular muscles given mental status/medical condition. No scleral icterus.  MOUTH: Dry mucosal membrane. Dentition intact. No abscess noted.  EAR, NOSE, THROAT: Clear without exudates. No external lesions.  NECK: Supple. No thyromegaly. No nodules. No JVD.  PULMONARY: Clear to ascultation, without wheeze rails or rhonci. No use of accessory muscles, Good respiratory effort. good air entry bilaterally CHEST: Nontender to palpation.  CARDIOVASCULAR: S1 and S2. Regular rate and rhythm. No murmurs, rubs, or gallops. No edema. Pedal pulses 2+ bilaterally.  GASTROINTESTINAL: Soft, nontender, nondistended. No masses. Positive bowel  sounds. No hepatosplenomegaly.  MUSCULOSKELETAL: No swelling, clubbing, or edema. Range of motion full in all extremities.  NEUROLOGIC: Unable to assess given mental status/medical condition SKIN: No ulceration, lesions, rashes, or cyanosis. Skin warm and dry. Turgor intact.  PSYCHIATRIC:Lethargic but arousable, Unable to assess given mental status/medical condition     LABORATORY PANEL:   CBC  Recent Labs Lab 12/04/15 1549  WBC 8.9  HGB 10.1*  HCT 30.4*  PLT 195   ------------------------------------------------------------------------------------------------------------------  Chemistries   Recent Labs Lab 12/04/15 1549  NA 129*  K 4.8  CL 94*  CO2 26  GLUCOSE 102*  BUN 22*  CREATININE 2.74*  CALCIUM 8.6*  AST 274*  ALT 176*  ALKPHOS 86  BILITOT 0.5   ------------------------------------------------------------------------------------------------------------------  Cardiac Enzymes  Recent Labs Lab 12/04/15 1549  TROPONINI 1.02*   ------------------------------------------------------------------------------------------------------------------  RADIOLOGY:  Ct Head Wo Contrast  Result Date: 12/04/2015 CLINICAL DATA:  Altered mental status/confusion EXAM: CT HEAD WITHOUT CONTRAST TECHNIQUE: Contiguous axial images were obtained from the base of the skull through the vertex without intravenous contrast. COMPARISON:  Head CT November 22, 2015 and brain MRI October 26, 2015 ; earlier head CT June 17, 2012 FINDINGS: Brain: The ventricles are normal in size and configuration. There is frontal atrophy bilaterally, more severe on the left than on the right, stable. There is no intracranial mass, hemorrhage, extra-axial fluid collection, or midline shift. Gray-white compartments appear normal. No acute infarct evident. Vascular: There is no hyperdense vessel. There are foci of calcification in the left vertebral artery as well as in both carotid siphon regions. Skull: The  bony calvarium appears intact. Sinuses/Orbits: The patient has had previous antrostomy on the right as well as right-sided removal of ethmoid air cell septae. There is a retention cyst in the anterior ethmoid region. There is a second retention cyst in the medial right frontal region. Visualized paranasal sinuses elsewhere are clear. Orbits appear symmetric bilaterally. Other: Mastoid air cells clear. IMPRESSION: Frontal lobe atrophy, more notable on the left than on the right, stable. No intracranial mass, hemorrhage, or extra-axial fluid collection. No focal gray - white compartment lesion evident. There are foci of vascular calcification. There is postoperative change in the right paranasal sinus complex with small retention cysts in the right anterior ethmoid and medial right frontal sinus regions. Electronically Signed   By: Lowella Grip III M.D.   On: 12/04/2015 16:32   Dg Chest Port 1 View  Result Date: 12/04/2015 CLINICAL DATA:  Confusion, altered mental status EXAM: PORTABLE CHEST 1 VIEW COMPARISON:  Chest x-ray of 11/20/2015 FINDINGS: No active infiltrate or effusion is seen. The lungs are not optimally aerated. Mediastinal and hilar contours are unremarkable. The heart is within upper limits of normal. No acute bony abnormality is seen. IMPRESSION: Suboptimal inspiration.  No active lung disease. Electronically Signed   By: Ivar Drape M.D.   On: 12/04/2015 16:44    EKG:   Orders placed or performed during the hospital encounter of 12/04/15  . EKG 12-Lead  . EKG 12-Lead  . EKG 12-Lead  . EKG 12-Lead  . ED EKG  . ED EKG    IMPRESSION AND PLAN:   75 year old Caucasian female history of COPD chronic respiratory failure who is presenting with altered mental status  1. Acute encephalopathy: Likely related to multifactorial- hypoxia, medications, renal failure: Continue with neuro checks every 4 hours, avoid further sedating agents 2. Acute renal failure: IV fluid hydration follow  urine output renal function avoid further nephrotoxic agents 3. Elevated troponin: Case discussed with cardiology likely related to demand secondary to prolonged hypoxia Place on telemetry trend cardiac enzymes 4. Essential hypertension: Coreg 5. Hyperlipidemia unspecified Lipitor    All the records are reviewed and case discussed with ED provider. Management plans discussed with the patient, family and they are in agreement.  CODE STATUS: Full  TOTAL TIME TAKING CARE OF THIS PATIENT: 33 minutes.    Nayan Proch,  Karenann Cai.D on 12/04/2015 at 6:14 PM  Between 7am to 6pm - Pager - [phone]  After 6pm: House Pager: - [phone]  [facility]  Office  [phone]  CC: Primary care physician; Tracie Harrier, MD

## 2015-12-04 NOTE — ED Triage Notes (Signed)
Patient reports to the ED via [facility] for confusion and altered mental status. EMS reports per husband started early last night thought she took too much medications and he let her sleep it off. Woke up just as confused and AMS as she went to bed with. EMS reports she was not able to follow commands in route. EMS gave '1mg'$  narcan IV with out improvement. O2 sats 81% room air.

## 2015-12-05 LAB — BASIC METABOLIC PANEL
Anion gap: 7 (ref 5–15)
BUN: 24 mg/dL — AB (ref 6–20)
CHLORIDE: 99 mmol/L — AB (ref 101–111)
CO2: 26 mmol/L (ref 22–32)
CREATININE: 1.98 mg/dL — AB (ref 0.44–1.00)
Calcium: 8.1 mg/dL — ABNORMAL LOW (ref 8.9–10.3)
GFR calc Af Amer: 28 mL/min — ABNORMAL LOW (ref >60)
GFR calc non Af Amer: 24 mL/min — ABNORMAL LOW (ref >60)
GLUCOSE: 86 mg/dL (ref 65–99)
POTASSIUM: 4.2 mmol/L (ref 3.5–5.1)
SODIUM: 132 mmol/L — AB (ref 135–145)

## 2015-12-05 LAB — TROPONIN I
TROPONIN I: 0.36 ng/mL — AB (ref <0.03)
Troponin I: 0.59 ng/mL (ref <0.03)

## 2015-12-05 LAB — CBC
HEMATOCRIT: 26.5 % — AB (ref 35.0–47.0)
Hemoglobin: 8.9 g/dL — ABNORMAL LOW (ref 12.0–16.0)
MCH: 30.8 pg (ref 26.0–34.0)
MCHC: 33.5 g/dL (ref 32.0–36.0)
MCV: 91.7 fL (ref 80.0–100.0)
PLATELETS: 162 10*3/uL (ref 150–440)
RBC: 2.89 MIL/uL — ABNORMAL LOW (ref 3.80–5.20)
RDW: 13.6 % (ref 11.5–14.5)
WBC: 5.5 10*3/uL (ref 3.6–11.0)

## 2015-12-05 MED ORDER — SODIUM CHLORIDE 0.9 % IV BOLUS (SEPSIS)
500.0000 mL | Freq: Once | INTRAVENOUS | Status: AC
  Administered 2015-12-05: 500 mL via INTRAVENOUS

## 2015-12-05 MED ORDER — GABAPENTIN 300 MG PO CAPS
600.0000 mg | ORAL_CAPSULE | Freq: Two times a day (BID) | ORAL | Status: DC
  Administered 2015-12-05 – 2015-12-07 (×5): 600 mg via ORAL
  Filled 2015-12-05 (×4): qty 2

## 2015-12-05 MED ORDER — TRAMADOL HCL 50 MG PO TABS
50.0000 mg | ORAL_TABLET | Freq: Four times a day (QID) | ORAL | Status: DC | PRN
  Administered 2015-12-05 – 2015-12-06 (×4): 50 mg via ORAL
  Filled 2015-12-05 (×4): qty 1

## 2015-12-05 MED ORDER — NICOTINE 14 MG/24HR TD PT24
14.0000 mg | MEDICATED_PATCH | Freq: Every day | TRANSDERMAL | Status: DC
  Administered 2015-12-05 – 2015-12-07 (×3): 14 mg via TRANSDERMAL
  Filled 2015-12-05 (×3): qty 1

## 2015-12-05 MED ORDER — DEXTROSE 5 % IV SOLN: 1.0000 g | INTRAVENOUS | Status: DC

## 2015-12-05 MED ORDER — CEFTRIAXONE SODIUM-DEXTROSE 1-3.74 GM-% IV SOLR
1.0000 g | INTRAVENOUS | Status: DC
  Administered 2015-12-05 – 2015-12-07 (×3): 1 g via INTRAVENOUS
  Filled 2015-12-05 (×3): qty 50

## 2015-12-05 NOTE — Progress Notes (Signed)
Pt seen chart reviewed full note to follow Imp AMS

## 2015-12-05 NOTE — Progress Notes (Signed)
Initial Nutrition Assessment  DOCUMENTATION CODES:   Non-severe (moderate) malnutrition in context of chronic illness  INTERVENTION:  1. Ensure Enlive po BID, each supplement provides 350 kcal and 20 grams of protein 2. Boost Breeze po bID, each supplement provides 250 kcal and 9 grams of protein   NUTRITION DIAGNOSIS:   Malnutrition related to chronic illness as evidenced by moderate depletion of body fat, moderate depletions of muscle mass.  GOAL:   Patient will meet greater than or equal to 90% of their needs  MONITOR:   PO intake, Supplement acceptance, I & O's, Labs, Weight trends  REASON FOR ASSESSMENT:   Other (Comment) (Low BMI)    ASSESSMENT:   [name] [name]  is a 75 y.o. female with a known history of Fibromyalgia, COPD, chronic respiratory failure on 2 L nasal cannula Baseline presenting with altered mental status  Spoke with [name] [name] at bedside. History questionable. She admits to eating 1 meal/day at home - normally fast food. Denies any weight loss but per chart she exhibits a 10#/8.3% insignificant wt loss over 7 months. Only had toast for breakfast this morning but was preparing to order some lunch. Nutrition-Focused physical exam completed. Findings are moderate fat depletion, moderate muscle depletion, and no edema.   Labs and medications reviewed: Na 132 Ns @ 64m/hr   Diet Order:  Diet Heart Room service appropriate? Yes; Fluid consistency: Thin  Skin:  Reviewed, no issues  Last BM:  12/01/2015  Height:   Ht Readings from Last 1 Encounters:  12/04/15 '5\' 6"'$  (1.676 m)    Weight:   Wt Readings from Last 1 Encounters:  12/04/15 113 lb 12.8 oz (51.6 kg)    Ideal Body Weight:  59.09 kg  BMI:  Body mass index is 18.37 kg/m.  Estimated Nutritional Needs:   Kcal:  1500-1800 calories  Protein:  70-80 gm  Fluid:  >/= 1.5L  EDUCATION NEEDS:   No education needs identified at this time  WSatira Anis Torres [place], MS, RD LDN Inpatient  Clinical Dietitian Pager [phone]

## 2015-12-05 NOTE — Consult Note (Signed)
Reason for Consult:Elevated troponin abnormal EKG Referring Physician: Dr Lavetta Nielsen hospitalist  [name] [name] is an 75 y.o. female.  HPI: Pt presented with AMS and hypotension. She was found to have an abnormal EKG and mildly elevated troponins .She is unresponsive so history is limited  Past Medical History:  Diagnosis Date  . Asthma   . Breast cancer ([place]) 2009   left  . Cancer ([place])   . CHF (congestive heart failure) ([place])   . CHF (congestive heart failure) ([place])   . Chronic UTI   . COPD (chronic obstructive pulmonary disease) ([place])   . Dizziness   . Fibromyalgia   . Hypertension   . Neuropathy ([place])   . Personal history of tobacco use, presenting hazards to health 05/17/2015  . Polyp, larynx   . RA (rheumatoid arthritis) ([place])   . Sinus infection    recent  . Stumbling gait    to the left  . Supplemental oxygen dependent    2.5l    Past Surgical History:  Procedure Laterality Date  . ABDOMINAL HYSTERECTOMY    . BREAST LUMPECTOMY Left 2009   chemo and radiation  . CYST EXCISION Left 02/27/2015   Procedure: CYST REMOVAL;  Surgeon: Hessie Knows, MD;  Location: [facility] ORS;  Service: Orthopedics;  Laterality: Left;  . EYE MUSCLE SURGERY Right    13 surgeries  . THUMB ARTHROSCOPY Left     Family History  Problem Relation Age of Onset  . Diabetes Father   . Stroke Father   . Heart attack Father   . CAD Sister     Social History:  reports that she has been smoking Cigarettes.  She has a 30.00 pack-year smoking history. She has never used smokeless tobacco. She reports that she does not drink alcohol or use drugs.  Allergies:  Allergies  Allergen Reactions  . Contrast Media [Iodinated Diagnostic Agents] Other (See Comments)    Pt was sent to the ED following contrast media injection at [place]. Unknown reason. She has been premedicated since without complications. Pt to be premedicated prior to contrast media injections  . Ioxaglate Other (See Comments)    Pt  was sent to the ED following contrast media injection at [place]. Unknown reason. She has been premedicated since without complications. Pt to be premedicated prior to contrast media injections  . Sulfa Antibiotics     Other reaction(s): Other (See Comments)  . Tetracycline Hives and Other (See Comments)  . White Petrolatum Other (See Comments)  . Amoxicillin-Pot Clavulanate Rash    Blisters in mouth Other reaction(s): Unknown Blisters in mouth Blisters in mouth  . Tape Rash    Medications: I have reviewed the patient's current medications.  Results for orders placed or performed during the hospital encounter of 12/04/15 (from the past 48 hour(s))  Acetaminophen level     Status: Abnormal   Collection Time: 12/04/15  3:49 PM  Result Value Ref Range   Acetaminophen (Tylenol), Serum <10 (L) 10 - 30 ug/mL    Comment:        THERAPEUTIC CONCENTRATIONS VARY SIGNIFICANTLY. A RANGE OF 10-30 ug/mL MAY BE AN EFFECTIVE CONCENTRATION FOR MANY PATIENTS. HOWEVER, SOME ARE BEST TREATED AT CONCENTRATIONS OUTSIDE THIS RANGE. ACETAMINOPHEN CONCENTRATIONS >150 ug/mL AT 4 HOURS AFTER INGESTION AND >50 ug/mL AT 12 HOURS AFTER INGESTION ARE OFTEN ASSOCIATED WITH TOXIC REACTIONS.   Comprehensive metabolic panel     Status: Abnormal   Collection Time: 12/04/15  3:49 PM  Result Value Ref  Range   Sodium 129 (L) 135 - 145 mmol/L   Potassium 4.8 3.5 - 5.1 mmol/L   Chloride 94 (L) 101 - 111 mmol/L   CO2 26 22 - 32 mmol/L   Glucose, Bld 102 (H) 65 - 99 mg/dL   BUN 22 (H) 6 - 20 mg/dL   Creatinine, Ser 2.74 (H) 0.44 - 1.00 mg/dL   Calcium 8.6 (L) 8.9 - 10.3 mg/dL   Total Protein 6.7 6.5 - 8.1 g/dL   Albumin 2.9 (L) 3.5 - 5.0 g/dL   AST 274 (H) 15 - 41 U/L   ALT 176 (H) 14 - 54 U/L   Alkaline Phosphatase 86 38 - 126 U/L   Total Bilirubin 0.5 0.3 - 1.2 mg/dL   GFR calc non Af Amer 16 (L) >60 mL/min   GFR calc Af Amer 19 (L) >60 mL/min    Comment: (NOTE) The eGFR has been calculated  using the CKD EPI equation. This calculation has not been validated in all clinical situations. eGFR's persistently <60 mL/min signify possible Chronic Kidney Disease.    Anion gap 9 5 - 15  Ethanol     Status: None   Collection Time: 12/04/15  3:49 PM  Result Value Ref Range   Alcohol, Ethyl (B) <5 <5 mg/dL    Comment:        LOWEST DETECTABLE LIMIT FOR SERUM ALCOHOL IS 5 mg/dL FOR MEDICAL PURPOSES ONLY   Troponin I     Status: Abnormal   Collection Time: 12/04/15  3:49 PM  Result Value Ref Range   Troponin I 1.02 (HH) <0.03 ng/mL    Comment: CRITICAL RESULT CALLED TO, READ BACK BY AND VERIFIED WITH KATIE NEWSHOLME @ 8309 12/04/15 BY [facility]   Lactic acid, plasma     Status: None   Collection Time: 12/04/15  3:49 PM  Result Value Ref Range   Lactic Acid, Venous 1.3 0.5 - 1.9 mmol/L  CBC with Differential     Status: Abnormal   Collection Time: 12/04/15  3:49 PM  Result Value Ref Range   WBC 8.9 3.6 - 11.0 K/uL   RBC 3.25 (L) 3.80 - 5.20 MIL/uL   Hemoglobin 10.1 (L) 12.0 - 16.0 g/dL   HCT 30.4 (L) 35.0 - 47.0 %   MCV 93.6 80.0 - 100.0 fL   MCH 31.2 26.0 - 34.0 pg   MCHC 33.4 32.0 - 36.0 g/dL   RDW 14.3 11.5 - 14.5 %   Platelets 195 150 - 440 K/uL   Neutrophils Relative % 77 %   Neutro Abs 6.8 (H) 1.4 - 6.5 K/uL   Lymphocytes Relative 17 %   Lymphs Abs 1.5 1.0 - 3.6 K/uL   Monocytes Relative 6 %   Monocytes Absolute 0.6 0.2 - 0.9 K/uL   Eosinophils Relative 0 %   Eosinophils Absolute 0.0 0 - 0.7 K/uL   Basophils Relative 0 %   Basophils Absolute 0.0 0 - 0.1 K/uL  Brain natriuretic peptide     Status: Abnormal   Collection Time: 12/04/15  3:49 PM  Result Value Ref Range   B Natriuretic Peptide 1,287.0 (H) 0.0 - 407.6 pg/mL  Salicylate level     Status: None   Collection Time: 12/04/15  3:49 PM  Result Value Ref Range   Salicylate Lvl <8.0 2.8 - 30.0 mg/dL  Blood gas, arterial     Status: Abnormal   Collection Time: 12/04/15  4:41 PM  Result Value Ref Range   FIO2  0.28  Delivery systems NASAL CANNULA    pH, Arterial 7.36 7.350 - 7.450   pCO2 arterial 46 32.0 - 48.0 mmHg   pO2, Arterial 61 (L) 83.0 - 108.0 mmHg   Bicarbonate 26.0 20.0 - 28.0 mmol/L   Acid-Base Excess 0.2 0.0 - 2.0 mmol/L   O2 Saturation 89.9 %   Patient temperature 37.0    Collection site RIGHT RADIAL    Sample type ARTERIAL DRAW    Allens test (pass/fail) PASS PASS  CK     Status: None   Collection Time: 12/04/15  5:00 PM  Result Value Ref Range   Total CK 45 38 - 234 U/L  Urinalysis complete, with microscopic     Status: Abnormal   Collection Time: 12/04/15  5:01 PM  Result Value Ref Range   Color, Urine AMBER (A) YELLOW   APPearance CLOUDY (A) CLEAR   Glucose, UA NEGATIVE NEGATIVE mg/dL   Bilirubin Urine NEGATIVE NEGATIVE   Ketones, ur NEGATIVE NEGATIVE mg/dL   Specific Gravity, Urine 1.015 1.005 - 1.030   Hgb urine dipstick NEGATIVE NEGATIVE   pH 5.0 5.0 - 8.0   Protein, ur 30 (A) NEGATIVE mg/dL   Nitrite NEGATIVE NEGATIVE   Leukocytes, UA 3+ (A) NEGATIVE   RBC / HPF 0-5 0 - 5 RBC/hpf   WBC, UA TOO NUMEROUS TO COUNT 0 - 5 WBC/hpf   Bacteria, UA MANY (A) NONE SEEN   Squamous Epithelial / LPF NONE SEEN NONE SEEN   WBC Clumps PRESENT    Mucous PRESENT    Hyaline Casts, UA PRESENT   Lactic acid, plasma     Status: None   Collection Time: 12/04/15  6:29 PM  Result Value Ref Range   Lactic Acid, Venous 1.1 0.5 - 1.9 mmol/L  Troponin I (q 6hr x 3)     Status: Abnormal   Collection Time: 12/04/15  6:29 PM  Result Value Ref Range   Troponin I 1.02 (HH) <0.03 ng/mL    Comment: CRITICAL VALUE NOTED. VALUE IS CONSISTENT WITH PREVIOUSLY REPORTED/CALLED VALUE. [place].  Troponin I (q 6hr x 3)     Status: Abnormal   Collection Time: 12/04/15 11:47 PM  Result Value Ref Range   Troponin I 0.59 (HH) <0.03 ng/mL    Comment: CRITICAL VALUE NOTED. VALUE IS CONSISTENT WITH PREVIOUSLY REPORTED/CALLED VALUE.MSS  Basic metabolic panel     Status: Abnormal   Collection Time:  12/05/15  6:12 AM  Result Value Ref Range   Sodium 132 (L) 135 - 145 mmol/L   Potassium 4.2 3.5 - 5.1 mmol/L   Chloride 99 (L) 101 - 111 mmol/L   CO2 26 22 - 32 mmol/L   Glucose, Bld 86 65 - 99 mg/dL   BUN 24 (H) 6 - 20 mg/dL   Creatinine, Ser 1.98 (H) 0.44 - 1.00 mg/dL   Calcium 8.1 (L) 8.9 - 10.3 mg/dL   GFR calc non Af Amer 24 (L) >60 mL/min   GFR calc Af Amer 28 (L) >60 mL/min    Comment: (NOTE) The eGFR has been calculated using the CKD EPI equation. This calculation has not been validated in all clinical situations. eGFR's persistently <60 mL/min signify possible Chronic Kidney Disease.    Anion gap 7 5 - 15  CBC     Status: Abnormal   Collection Time: 12/05/15  6:12 AM  Result Value Ref Range   WBC 5.5 3.6 - 11.0 K/uL   RBC 2.89 (L) 3.80 - 5.20 MIL/uL   Hemoglobin 8.9 (  L) 12.0 - 16.0 g/dL   HCT 26.5 (L) 35.0 - 47.0 %   MCV 91.7 80.0 - 100.0 fL   MCH 30.8 26.0 - 34.0 pg   MCHC 33.5 32.0 - 36.0 g/dL   RDW 13.6 11.5 - 14.5 %   Platelets 162 150 - 440 K/uL  Troponin I (q 6hr x 3)     Status: Abnormal   Collection Time: 12/05/15  6:12 AM  Result Value Ref Range   Troponin I 0.36 (HH) <0.03 ng/mL    Comment: CRITICAL VALUE NOTED. VALUE IS CONSISTENT WITH PREVIOUSLY REPORTED/CALLED VALUE/HKP    Ct Head Wo Contrast  Result Date: 12/04/2015 CLINICAL DATA:  Altered mental status/confusion EXAM: CT HEAD WITHOUT CONTRAST TECHNIQUE: Contiguous axial images were obtained from the base of the skull through the vertex without intravenous contrast. COMPARISON:  Head CT November 22, 2015 and brain MRI October 26, 2015 ; earlier head CT June 17, 2012 FINDINGS: Brain: The ventricles are normal in size and configuration. There is frontal atrophy bilaterally, more severe on the left than on the right, stable. There is no intracranial mass, hemorrhage, extra-axial fluid collection, or midline shift. Gray-white compartments appear normal. No acute infarct evident. Vascular: There is no  hyperdense vessel. There are foci of calcification in the left vertebral artery as well as in both carotid siphon regions. Skull: The bony calvarium appears intact. Sinuses/Orbits: The patient has had previous antrostomy on the right as well as right-sided removal of ethmoid air cell septae. There is a retention cyst in the anterior ethmoid region. There is a second retention cyst in the medial right frontal region. Visualized paranasal sinuses elsewhere are clear. Orbits appear symmetric bilaterally. Other: Mastoid air cells clear. IMPRESSION: Frontal lobe atrophy, more notable on the left than on the right, stable. No intracranial mass, hemorrhage, or extra-axial fluid collection. No focal gray - white compartment lesion evident. There are foci of vascular calcification. There is postoperative change in the right paranasal sinus complex with small retention cysts in the right anterior ethmoid and medial right frontal sinus regions. Electronically Signed   By: Lowella Grip III M.D.   On: 12/04/2015 16:32   Dg Chest Port 1 View  Result Date: 12/04/2015 CLINICAL DATA:  Confusion, altered mental status EXAM: PORTABLE CHEST 1 VIEW COMPARISON:  Chest x-ray of 11/20/2015 FINDINGS: No active infiltrate or effusion is seen. The lungs are not optimally aerated. Mediastinal and hilar contours are unremarkable. The heart is within upper limits of normal. No acute bony abnormality is seen. IMPRESSION: Suboptimal inspiration.  No active lung disease. Electronically Signed   By: Ivar Drape M.D.   On: 12/04/2015 16:44    Review of Systems  Unable to perform ROS: Mental status change  Constitutional: Positive for fever.   Blood pressure (!) 99/38, pulse 76, temperature 98.4 F (36.9 C), temperature source Oral, resp. rate 20, height '5\' 6"'  (1.676 m), weight 51.6 kg (113 lb 12.8 oz), SpO2 96 %. Physical Exam  Nursing note and vitals reviewed. HENT:  Head: Normocephalic and atraumatic.  Eyes: Conjunctivae and  EOM are normal. Pupils are equal, round, and reactive to light.  Neck: Normal range of motion. Neck supple.  Cardiovascular: Normal rate, regular rhythm and normal heart sounds.   Respiratory: Tachypnea noted. She is in respiratory distress. She has decreased breath sounds in the right upper field, the right middle field, the right lower field, the left upper field, the left middle field and the left lower field.  GI:  Soft. Bowel sounds are normal.  Musculoskeletal: Normal range of motion.  Neurological: She is unresponsive.  Skin: Skin is warm and dry.    Assessment/Plan: AMS Pneumonia Elevated troponin ARI/CRI COPD CHF-systolic Hyponatremia Hypotension Abnormal EKG Urosepsis . PLAN Agree with admission to tele F/U EKG and troponins Consider hydration  Agree with broad spectrum antibx Rec pulmonary input Consider inhalers Agree with supplemental 02 Continue foley Consider ECHO Defer cardiac cath or any invasive procedures     Dwayne D Callwood 12/05/2015, 12:51 PM

## 2015-12-05 NOTE — Progress Notes (Signed)
[facility] [place] NAME: [name] [name]    MR#:  [id number]  DATE OF BIRTH:  05-27-1940  SUBJECTIVE:  CHIEF COMPLAINT:   Chief Complaint  Patient presents with  . Altered Mental Status   More awake. Able to answer appropriately.  Had significant confusion during last hospital stay.  REVIEW OF SYSTEMS:    Review of Systems  Constitutional: Positive for malaise/fatigue. Negative for chills and fever.  HENT: Negative for sore throat.   Eyes: Negative for blurred vision, double vision and pain.  Respiratory: Negative for cough, hemoptysis, shortness of breath and wheezing.   Cardiovascular: Negative for chest pain, palpitations, orthopnea and leg swelling.  Gastrointestinal: Negative for abdominal pain, constipation, diarrhea, heartburn, nausea and vomiting.  Genitourinary: Negative for dysuria and hematuria.  Musculoskeletal: Negative for back pain and joint pain.  Skin: Negative for rash.  Neurological: Positive for weakness. Negative for sensory change, speech change, focal weakness and headaches.  Endo/Heme/Allergies: Does not bruise/bleed easily.  Psychiatric/Behavioral: Negative for depression. The patient is not nervous/anxious.     DRUG ALLERGIES:   Allergies  Allergen Reactions  . Contrast Media [Iodinated Diagnostic Agents] Other (See Comments)    Pt was sent to the ED following contrast media injection at [place]. Unknown reason. She has been premedicated since without complications. Pt to be premedicated prior to contrast media injections  . Ioxaglate Other (See Comments)    Pt was sent to the ED following contrast media injection at [place]. Unknown reason. She has been premedicated since without complications. Pt to be premedicated prior to contrast media injections  . Sulfa Antibiotics     Other reaction(s): Other (See Comments)  . Tetracycline Hives and Other (See Comments)  . White Petrolatum  Other (See Comments)  . Amoxicillin-Pot Clavulanate Rash    Blisters in mouth Other reaction(s): Unknown Blisters in mouth Blisters in mouth  . Tape Rash    VITALS:  Blood pressure (!) 113/46, pulse 81, temperature 98.7 F (37.1 C), temperature source Oral, resp. rate 16, height '5\' 6"'$  (1.676 m), weight 51.6 kg (113 lb 12.8 oz), SpO2 96 %.  PHYSICAL EXAMINATION:   Physical Exam  GENERAL:  75 y.o.-year-old patient lying in the bed with no acute distress.  EYES: Pupils equal, round, reactive to light and accommodation. No scleral icterus. Extraocular muscles intact.  HEENT: Head atraumatic, normocephalic. Oropharynx and nasopharynx clear.  NECK:  Supple, no jugular venous distention. No thyroid enlargement, no tenderness.  LUNGS: Normal breath sounds bilaterally, no wheezing, rales, rhonchi. No use of accessory muscles of respiration.  CARDIOVASCULAR: S1, S2 normal. No murmurs, rubs, or gallops.  ABDOMEN: Soft, nontender, nondistended. Bowel sounds present. No organomegaly or mass.  EXTREMITIES: No cyanosis, clubbing or edema b/l.    NEUROLOGIC: Cranial nerves II through XII are intact. No focal Motor or sensory deficits b/l.   PSYCHIATRIC: The patient is alert and awake SKIN: No obvious rash, lesion, or ulcer.   LABORATORY PANEL:   CBC  Recent Labs Lab 12/05/15 0612  WBC 5.5  HGB 8.9*  HCT 26.5*  PLT 162   ------------------------------------------------------------------------------------------------------------------ Chemistries   Recent Labs Lab 12/04/15 1549 12/05/15 0612  NA 129* 132*  K 4.8 4.2  CL 94* 99*  CO2 26 26  GLUCOSE 102* 86  BUN 22* 24*  CREATININE 2.74* 1.98*  CALCIUM 8.6* 8.1*  AST 274*  --   ALT 176*  --   ALKPHOS 86  --   BILITOT  0.5  --    ------------------------------------------------------------------------------------------------------------------  Cardiac Enzymes  Recent Labs Lab 12/05/15 0612  TROPONINI 0.36*    ------------------------------------------------------------------------------------------------------------------  RADIOLOGY:  Ct Head Wo Contrast  Result Date: 12/04/2015 CLINICAL DATA:  Altered mental status/confusion EXAM: CT HEAD WITHOUT CONTRAST TECHNIQUE: Contiguous axial images were obtained from the base of the skull through the vertex without intravenous contrast. COMPARISON:  Head CT November 22, 2015 and brain MRI October 26, 2015 ; earlier head CT June 17, 2012 FINDINGS: Brain: The ventricles are normal in size and configuration. There is frontal atrophy bilaterally, more severe on the left than on the right, stable. There is no intracranial mass, hemorrhage, extra-axial fluid collection, or midline shift. Gray-white compartments appear normal. No acute infarct evident. Vascular: There is no hyperdense vessel. There are foci of calcification in the left vertebral artery as well as in both carotid siphon regions. Skull: The bony calvarium appears intact. Sinuses/Orbits: The patient has had previous antrostomy on the right as well as right-sided removal of ethmoid air cell septae. There is a retention cyst in the anterior ethmoid region. There is a second retention cyst in the medial right frontal region. Visualized paranasal sinuses elsewhere are clear. Orbits appear symmetric bilaterally. Other: Mastoid air cells clear. IMPRESSION: Frontal lobe atrophy, more notable on the left than on the right, stable. No intracranial mass, hemorrhage, or extra-axial fluid collection. No focal gray - white compartment lesion evident. There are foci of vascular calcification. There is postoperative change in the right paranasal sinus complex with small retention cysts in the right anterior ethmoid and medial right frontal sinus regions. Electronically Signed   By: Lowella Grip III M.D.   On: 12/04/2015 16:32   Dg Chest Port 1 View  Result Date: 12/04/2015 CLINICAL DATA:  Confusion, altered mental  status EXAM: PORTABLE CHEST 1 VIEW COMPARISON:  Chest x-ray of 11/20/2015 FINDINGS: No active infiltrate or effusion is seen. The lungs are not optimally aerated. Mediastinal and hilar contours are unremarkable. The heart is within upper limits of normal. No acute bony abnormality is seen. IMPRESSION: Suboptimal inspiration.  No active lung disease. Electronically Signed   By: Ivar Drape M.D.   On: 12/04/2015 16:44     ASSESSMENT AND PLAN:   75 year old Caucasian female history of COPD chronic respiratory failure who is presenting with altered mental status  1. UTI with acute encephalopathy Start IV ceftriaxone. Send urine cultures.  2. Acute renal failure Due to UTI and dehydration. Improving with IV fluids.  3. Elevated troponin Seen by cardiology Dr. Clayborn Bigness. Most likely demand ischemia and not an STEMI. Patient has no chest pain.  4. Essential hypertension Hold Coreg due to hypotension  5. Hyperlipidemia unspecified Lipitor  6. Anemia of chronic disease is stable  All the records are reviewed and case discussed with Care Management/Social Workerr. Management plans discussed with the patient, family and they are in agreement.  CODE STATUS: FULL CODE  DVT Prophylaxis: SCDs  TOTAL TIME TAKING CARE OF THIS PATIENT: 35 minutes.   POSSIBLE D/C IN 2-3 DAYS, DEPENDING ON CLINICAL CONDITION.  Hillary Bow R M.D on 12/05/2015 at 9:06 AM  Between 7am to 6pm - Pager - [phone]  After 6pm go to [email or web address] - password [email or web address] [facility]  Office  [phone]  CC: Primary care physician; Tracie Harrier, MD  Note: This dictation was prepared with Dragon dictation along with smaller phrase technology. Any transcriptional errors that result from this process are unintentional.

## 2015-12-06 LAB — CBC WITH DIFFERENTIAL/PLATELET
BASOS ABS: 0 10*3/uL (ref 0–0.1)
BASOS PCT: 1 %
Eosinophils Absolute: 0.2 10*3/uL (ref 0–0.7)
Eosinophils Relative: 4 %
HEMATOCRIT: 26.7 % — AB (ref 35.0–47.0)
HEMOGLOBIN: 9.2 g/dL — AB (ref 12.0–16.0)
Lymphocytes Relative: 29 %
Lymphs Abs: 1.3 10*3/uL (ref 1.0–3.6)
MCH: 31.8 pg (ref 26.0–34.0)
MCHC: 34.3 g/dL (ref 32.0–36.0)
MCV: 92.7 fL (ref 80.0–100.0)
Monocytes Absolute: 0.4 10*3/uL (ref 0.2–0.9)
Monocytes Relative: 9 %
NEUTROS ABS: 2.5 10*3/uL (ref 1.4–6.5)
NEUTROS PCT: 57 %
Platelets: 167 10*3/uL (ref 150–440)
RBC: 2.88 MIL/uL — AB (ref 3.80–5.20)
RDW: 13.7 % (ref 11.5–14.5)
WBC: 4.3 10*3/uL (ref 3.6–11.0)

## 2015-12-06 LAB — BASIC METABOLIC PANEL
ANION GAP: 6 (ref 5–15)
BUN: 19 mg/dL (ref 6–20)
CALCIUM: 8.1 mg/dL — AB (ref 8.9–10.3)
CO2: 28 mmol/L (ref 22–32)
Chloride: 101 mmol/L (ref 101–111)
Creatinine, Ser: 1.41 mg/dL — ABNORMAL HIGH (ref 0.44–1.00)
GFR, EST AFRICAN AMERICAN: 42 mL/min — AB (ref >60)
GFR, EST NON AFRICAN AMERICAN: 36 mL/min — AB (ref >60)
Glucose, Bld: 92 mg/dL (ref 65–99)
Potassium: 3.7 mmol/L (ref 3.5–5.1)
Sodium: 135 mmol/L (ref 135–145)

## 2015-12-06 MED ORDER — CARVEDILOL 3.125 MG PO TABS
3.1250 mg | ORAL_TABLET | Freq: Two times a day (BID) | ORAL | Status: DC
  Administered 2015-12-06 – 2015-12-07 (×2): 3.125 mg via ORAL
  Filled 2015-12-06 (×2): qty 1

## 2015-12-06 NOTE — Progress Notes (Signed)
[facility] [place] NAME: [name] [name]    MR#:  [id number]  DATE OF BIRTH:  01/07/1941  SUBJECTIVE:   Wants to go home doing well  REVIEW OF SYSTEMS:    Review of Systems  Constitutional: Negative.  Negative for chills, fever and malaise/fatigue.  HENT: Negative.  Negative for ear discharge, ear pain, hearing loss, nosebleeds and sore throat.   Eyes: Negative.  Negative for blurred vision and pain.  Respiratory: Negative.  Negative for cough, hemoptysis, shortness of breath and wheezing.   Cardiovascular: Negative.  Negative for chest pain, palpitations and leg swelling.  Gastrointestinal: Negative.  Negative for abdominal pain, blood in stool, diarrhea, nausea and vomiting.  Genitourinary: Negative.  Negative for dysuria.  Musculoskeletal: Negative.  Negative for back pain.  Skin: Negative.   Neurological: Negative for dizziness, tremors, speech change, focal weakness, seizures and headaches.  Endo/Heme/Allergies: Negative.  Does not bruise/bleed easily.  Psychiatric/Behavioral: Negative.  Negative for depression, hallucinations and suicidal ideas.    Tolerating Diet:yes      DRUG ALLERGIES:   Allergies  Allergen Reactions  . Contrast Media [Iodinated Diagnostic Agents] Other (See Comments)    Pt was sent to the ED following contrast media injection at [facility]. Unknown reason. She has been premedicated since without complications. Pt to be premedicated prior to contrast media injections  . Ioxaglate Other (See Comments)    Pt was sent to the ED following contrast media injection at [facility]. Unknown reason. She has been premedicated since without complications. Pt to be premedicated prior to contrast media injections  . Sulfa Antibiotics     Other reaction(s): Other (See Comments)  . Tetracycline Hives and Other (See Comments)  . White Petrolatum Other (See Comments)  . Amoxicillin-Pot Clavulanate Rash     Blisters in mouth Other reaction(s): Unknown Blisters in mouth Blisters in mouth  . Tape Rash    VITALS:  Blood pressure (!) 113/44, pulse 85, temperature 97.6 F (36.4 C), temperature source Oral, resp. rate 18, height '5\' 6"'$  (1.676 m), weight 51.6 kg (113 lb 12.8 oz), SpO2 99 %.  PHYSICAL EXAMINATION:   Physical Exam  Constitutional: She is oriented to person, place, and time and well-developed, well-nourished, and in no distress. No distress.  HENT:  Head: Normocephalic.  Eyes: No scleral icterus.  Neck: Normal range of motion. Neck supple. No JVD present. No tracheal deviation present.  Cardiovascular: Normal rate, regular rhythm and normal heart sounds.  Exam reveals no gallop and no friction rub.   No murmur heard. Pulmonary/Chest: Effort normal and breath sounds normal. No respiratory distress. She has no wheezes. She has no rales. She exhibits no tenderness.  Abdominal: Soft. Bowel sounds are normal. She exhibits no distension and no mass. There is no tenderness. There is no rebound and no guarding.  Musculoskeletal: Normal range of motion. She exhibits no edema.  Neurological: She is alert and oriented to person, place, and time.  Skin: Skin is warm. No rash noted. No erythema.  Psychiatric: Affect and judgment normal.      LABORATORY PANEL:   CBC  Recent Labs Lab 12/06/15 0509  WBC 4.3  HGB 9.2*  HCT 26.7*  PLT 167   ------------------------------------------------------------------------------------------------------------------  Chemistries   Recent Labs Lab 12/04/15 1549  12/06/15 0509  NA 129*  < > 135  K 4.8  < > 3.7  CL 94*  < > 101  CO2 26  < > 28  GLUCOSE  102*  < > 92  BUN 22*  < > 19  CREATININE 2.74*  < > 1.41*  CALCIUM 8.6*  < > 8.1*  AST 274*  --   --   ALT 176*  --   --   ALKPHOS 86  --   --   BILITOT 0.5  --   --   < > = values in this interval not  displayed. ------------------------------------------------------------------------------------------------------------------  Cardiac Enzymes  Recent Labs Lab 12/04/15 1829 12/04/15 2347 12/05/15 0612  TROPONINI 1.02* 0.59* 0.36*   ------------------------------------------------------------------------------------------------------------------  RADIOLOGY:  Ct Head Wo Contrast  Result Date: 12/04/2015 CLINICAL DATA:  Altered mental status/confusion EXAM: CT HEAD WITHOUT CONTRAST TECHNIQUE: Contiguous axial images were obtained from the base of the skull through the vertex without intravenous contrast. COMPARISON:  Head CT November 22, 2015 and brain MRI October 26, 2015 ; earlier head CT June 17, 2012 FINDINGS: Brain: The ventricles are normal in size and configuration. There is frontal atrophy bilaterally, more severe on the left than on the right, stable. There is no intracranial mass, hemorrhage, extra-axial fluid collection, or midline shift. Gray-white compartments appear normal. No acute infarct evident. Vascular: There is no hyperdense vessel. There are foci of calcification in the left vertebral artery as well as in both carotid siphon regions. Skull: The bony calvarium appears intact. Sinuses/Orbits: The patient has had previous antrostomy on the right as well as right-sided removal of ethmoid air cell septae. There is a retention cyst in the anterior ethmoid region. There is a second retention cyst in the medial right frontal region. Visualized paranasal sinuses elsewhere are clear. Orbits appear symmetric bilaterally. Other: Mastoid air cells clear. IMPRESSION: Frontal lobe atrophy, more notable on the left than on the right, stable. No intracranial mass, hemorrhage, or extra-axial fluid collection. No focal gray - white compartment lesion evident. There are foci of vascular calcification. There is postoperative change in the right paranasal sinus complex with small retention cysts in the  right anterior ethmoid and medial right frontal sinus regions. Electronically Signed   By: Lowella Grip III M.D.   On: 12/04/2015 16:32   Dg Chest Port 1 View  Result Date: 12/04/2015 CLINICAL DATA:  Confusion, altered mental status EXAM: PORTABLE CHEST 1 VIEW COMPARISON:  Chest x-ray of 11/20/2015 FINDINGS: No active infiltrate or effusion is seen. The lungs are not optimally aerated. Mediastinal and hilar contours are unremarkable. The heart is within upper limits of normal. No acute bony abnormality is seen. IMPRESSION: Suboptimal inspiration.  No active lung disease. Electronically Signed   By: Ivar Drape M.D.   On: 12/04/2015 16:44     ASSESSMENT AND PLAN:   75 year old female with history of COPD and ESBL UTI in 2014 presents with acute encephalopathy secondary to urinary tract infection.  1. Acute encephalopathy due to urinary tract infection:  Mental status is now baseline.  2. E Coli UTI: Given remote hx of ESBL, patient will need to stay in hospital until sensitivities are back. Lab says tomorrow Continue Rocephin for now as patient seems to respond to this  3. Hyperlipidemia: Continue statin  4. Elevated troponin due to demand ischemia and not ACS. Cardio allergy consult appreciated  5. Essential hypertension: Restart low-dose Coreg and watch blood pressure 6. Anemia of chronic disease with a stable hemoglobin    Management plans discussed with the patient and she is in agreement.  CODE STATUS: full  TOTAL TIME TAKING CARE OF THIS PATIENT: 30 minutes.   D/w nursing  POSSIBLE D/C tomorrow, DEPENDING ON CLINICAL CONDITION.   Matea Stanard M.D on 12/06/2015 at 11:02 AM  Between 7am to 6pm - Pager - [phone] After 6pm go to [email or web address] - password [email or web address] [facility]  Office  [phone]  CC: Primary care physician; Tracie Harrier, MD  Note: This dictation was prepared with Dragon dictation along with smaller phrase technology.  Any transcriptional errors that result from this process are unintentional.

## 2015-12-07 ENCOUNTER — Inpatient Hospital Stay: Payer: Commercial Managed Care - HMO

## 2015-12-07 LAB — URINE CULTURE

## 2015-12-07 MED ORDER — ENSURE ENLIVE PO LIQD: 237.0000 mL | Freq: Every day | ORAL | 12 refills | Status: DC

## 2015-12-07 MED ORDER — SODIUM CHLORIDE 0.9 % IV SOLN: 500.0000 mg | INTRAVENOUS | 0 refills | Status: AC

## 2015-12-07 MED ORDER — SODIUM CHLORIDE 0.9 % IV SOLN
250.0000 mg | INTRAVENOUS | Status: DC
  Filled 2015-12-07: qty 0.25

## 2015-12-07 MED ORDER — SODIUM CHLORIDE 0.9 % IV SOLN
500.0000 mg | INTRAVENOUS | Status: DC
  Administered 2015-12-07: 0.5 g via INTRAVENOUS
  Filled 2015-12-07: qty 0.5

## 2015-12-07 NOTE — Care Management Important Message (Signed)
Important Message  Patient Details  Name: [name] [name] MRN: [id number] Date of Birth: 02-27-1940   Medicare Important Message Given:       Mardene Speak, RN 12/07/2015, 7:55 AM

## 2015-12-07 NOTE — Progress Notes (Signed)
Dressing on new PICC line in right upper arm is saturated. Dressing changed per policy. Pt tolerated procedure. I will continue to monitor for bleeding.

## 2015-12-07 NOTE — Discharge Summary (Signed)
[facility] [place] NAME: [name] [name]    MR#:  [id number]  DATE OF BIRTH:  1940-11-05  DATE OF ADMISSION:  12/04/2015 ADMITTING PHYSICIAN: Lytle Butte, MD  DATE OF DISCHARGE: 12/07/2015  PRIMARY CARE PHYSICIAN: Tracie Harrier, MD    ADMISSION DIAGNOSIS:  Shortness of breath [R06.02] Acute renal insufficiency [N28.9] Hypoxia [R09.02] Altered mental status, unspecified altered mental status type [R41.82]  DISCHARGE DIAGNOSIS:  Active Problems:   Acute renal failure (ARF) ([place])   SECONDARY DIAGNOSIS:   Past Medical History:  Diagnosis Date  . Asthma   . Breast cancer ([place]) 2009   left  . Cancer ([place])   . CHF (congestive heart failure) ([place])   . CHF (congestive heart failure) ([place])   . Chronic UTI   . COPD (chronic obstructive pulmonary disease) ([place])   . Dizziness   . Fibromyalgia   . Hypertension   . Neuropathy ([place])   . Personal history of tobacco use, presenting hazards to health 05/17/2015  . Polyp, larynx   . RA (rheumatoid arthritis) ([place])   . Sinus infection    recent  . Stumbling gait    to the left  . Supplemental oxygen dependent    2.5l    HOSPITAL COURSE:   75 year old female with history of COPD and ESBL UTI in 2014 presents with acute encephalopathy secondary to urinary tract infection.  1. Acute encephalopathy due to ESBL urinary tract infection:  Mental status is now baseline.  2. ESBL E Coli UTI: She was on  Rocephin But urine culture came back for ESBL which is sensitive to imipenem and Macrobid. However due to creatinine and age she is unable to take Macrobid. She will have PICC line placed and start Invanz. This will need to be continued for 7 days. She will have a CBC and BMP every 3 days which should be sent to her PCP  3. Hyperlipidemia: Continue statin  4. Elevated troponin due to demand ischemia and not ACS. Cardiology consult appreciated  5. Essential hypertension:Continue Coreg   6. Anemia of chronic disease with a stable hemoglobin   DISCHARGE CONDITIONS AND DIET:   Stable for discharge on heart healthy diet  CONSULTS OBTAINED:  Treatment Team:  Lytle Butte, MD Yolonda Kida, MD  DRUG ALLERGIES:   Allergies  Allergen Reactions  . Contrast Media [Iodinated Diagnostic Agents] Other (See Comments)    Pt was sent to the ED following contrast media injection at [facility]. Unknown reason. She has been premedicated since without complications. Pt to be premedicated prior to contrast media injections  . Ioxaglate Other (See Comments)    Pt was sent to the ED following contrast media injection at [facility]. Unknown reason. She has been premedicated since without complications. Pt to be premedicated prior to contrast media injections  . Sulfa Antibiotics     Other reaction(s): Other (See Comments)  . Tetracycline Hives and Other (See Comments)  . White Petrolatum Other (See Comments)  . Amoxicillin-Pot Clavulanate Rash    Blisters in mouth Other reaction(s): Unknown Blisters in mouth Blisters in mouth  . Tape Rash    DISCHARGE MEDICATIONS:   Current Discharge Medication List    START taking these medications   Details  ertapenem 0.5 g in sodium chloride 0.9 % 50 mL Inject 0.5 g into the vein daily. Qty: 3500 mg of imipenem, Refills: 0      CONTINUE these medications which have CHANGED   Details  feeding supplement, ENSURE ENLIVE, (ENSURE ENLIVE) LIQD Take 237 mLs by mouth daily at 3 pm. Qty: 237 mL, Refills: 12      CONTINUE these medications which have NOT CHANGED   Details  atorvastatin (LIPITOR) 40 MG tablet Take 1 tablet (40 mg total) by mouth daily at 6 PM. Qty: 30 tablet, Refills: 0    carvedilol (COREG) 12.5 MG tablet Take 1 tablet (12.5 mg total) by mouth 2 (two) times daily with a meal. Qty: 60 tablet, Refills: 0    clonazePAM (KLONOPIN) 1 MG tablet Take 1 mg by mouth 2 (two) times daily as needed for anxiety.      Diphenhyd-Hydrocort-Nystatin (FIRST-DUKES MOUTHWASH) SUSP Use as directed 5 mLs in the mouth or throat every 6 (six) hours.    furosemide (LASIX) 20 MG tablet Take 20 mg by mouth every other day.    gabapentin (NEURONTIN) 600 MG tablet Take 600 mg by mouth 3 (three) times daily.    imipramine (TOFRANIL) 25 MG tablet Take 1 tablet by mouth at bedtime.     lisinopril (PRINIVIL,ZESTRIL) 2.5 MG tablet Take 2.5 mg by mouth daily.    traMADol (ULTRAM) 50 MG tablet Take 100 mg by mouth every 8 (eight) hours.     zolpidem (AMBIEN) 5 MG tablet Take 5 mg by mouth at bedtime.    aspirin 325 MG EC tablet Take 1 tablet (325 mg total) by mouth daily. Qty: 30 tablet, Refills: 0      STOP taking these medications     risperiDONE (RISPERDAL M-TABS) 1 MG disintegrating tablet      budesonide-formoterol (SYMBICORT) 160-4.5 MCG/ACT inhaler         Patient reports that her PCP asked her not to take Risperdal. She has not been taking Symbicort.      Today   CHIEF COMPLAINT:    Patient is ready for discharge.   VITAL SIGNS:  Blood pressure (!) 137/56, pulse 77, temperature 98 F (36.7 C), resp. rate 16, height '5\' 6"'$  (1.676 m), weight 49.7 kg (109 lb 9.6 oz), SpO2 100 %.   REVIEW OF SYSTEMS:  Review of Systems  Constitutional: Negative.  Negative for chills, fever and malaise/fatigue.  HENT: Negative.  Negative for ear discharge, ear pain, hearing loss, nosebleeds and sore throat.   Eyes: Negative.  Negative for blurred vision and pain.  Respiratory: Negative.  Negative for cough, hemoptysis, shortness of breath and wheezing.   Cardiovascular: Negative.  Negative for chest pain, palpitations and leg swelling.  Gastrointestinal: Negative.  Negative for abdominal pain, blood in stool, diarrhea, nausea and vomiting.  Genitourinary: Negative.  Negative for dysuria.  Musculoskeletal: Negative.  Negative for back pain.  Skin: Negative.   Neurological: Negative for dizziness, tremors,  speech change, focal weakness, seizures and headaches.  Endo/Heme/Allergies: Negative.  Does not bruise/bleed easily.  Psychiatric/Behavioral: Negative.  Negative for depression, hallucinations and suicidal ideas.     PHYSICAL EXAMINATION:  GENERAL:  75 y.o.-year-old patient lying in the bed with no acute distress.  NECK:  Supple, no jugular venous distention. No thyroid enlargement, no tenderness.  LUNGS: Normal breath sounds bilaterally, no wheezing, rales,rhonchi  No use of accessory muscles of respiration.  CARDIOVASCULAR: S1, S2 normal. No murmurs, rubs, or gallops.  ABDOMEN: Soft, non-tender, non-distended. Bowel sounds present. No organomegaly or mass.  EXTREMITIES: No pedal edema, cyanosis, or clubbing.  PSYCHIATRIC: The patient is alert and oriented x 3.  SKIN: No obvious rash, lesion, or ulcer.   DATA REVIEW:   CBC  Recent Labs Lab 12/06/15 0509  WBC 4.3  HGB 9.2*  HCT 26.7*  PLT 167    Chemistries   Recent Labs Lab 12/04/15 1549  12/06/15 0509  NA 129*  < > 135  K 4.8  < > 3.7  CL 94*  < > 101  CO2 26  < > 28  GLUCOSE 102*  < > 92  BUN 22*  < > 19  CREATININE 2.74*  < > 1.41*  CALCIUM 8.6*  < > 8.1*  AST 274*  --   --   ALT 176*  --   --   ALKPHOS 86  --   --   BILITOT 0.5  --   --   < > = values in this interval not displayed.  Cardiac Enzymes  Recent Labs Lab 12/04/15 1829 12/04/15 2347 12/05/15 0612  TROPONINI 1.02* 0.59* 0.36*    Microbiology Results  '@[id number]'$ @  RADIOLOGY:  No results found.    Management plans discussed with the patient and she is in agreement. Stable for discharge home with [facility]   Patient should follow up with [facility]  CODE STATUS:     Code Status Orders        Start     Ordered   12/04/15 1729  Full code  Continuous     12/04/15 1728    Code Status History    Date Active Date Inactive Code Status Order ID Comments User Context   11/15/2015  2:31 AM 11/29/2015  6:30 PM Full Code [id number]  Lance Coon, MD Inpatient    Advance Directive Documentation   Flowsheet Row Most Recent Value  Type of Advance Directive  Healthcare Power of Attorney  Pre-existing out of facility DNR order (yellow form or pink MOST form)  No data  "MOST" Form in Place?  No data      TOTAL TIME TAKING CARE OF THIS PATIENT: 36 minutes.    Note: This dictation was prepared with Dragon dictation along with smaller phrase technology. Any transcriptional errors that result from this process are unintentional.  Conda Wannamaker M.D on 12/07/2015 at 11:10 AM  Between 7am to 6pm - Pager - [phone] After 6pm go to [email or web address] - password [email or web address] [facility]  Office  [phone]  CC: Primary care physician; Tracie Harrier, MD

## 2015-12-07 NOTE — Care Management Note (Signed)
Case Management Note  Patient Details  Name: [name] [name] MRN: [id number] Date of Birth: 10-09-1940  Subjective/Objective:    Discussed discharge planning with [name] [name]. She chose [facility] to be her home health provider for home health RN and for IV Ertapenem 0.5grams IV daily x 7 days via PICC line. PICC line to be placed today. Have called and left messages for [facility]. Am still awaiting a call back.                 Action/Plan:   Expected Discharge Date:                  Expected Discharge Plan:     In-House Referral:     Discharge planning Services     Post Acute Care Choice:    Choice offered to:     DME Arranged:    DME Agency:     HH Arranged:    HH Agency:     Status of Service:     If discussed at [employer] of Stay Meetings, dates discussed:    Additional Comments:  Arthi Mcdonald A, RN 12/07/2015, 9:44 AM

## 2015-12-07 NOTE — Care Management Note (Signed)
Case Management Note  Patient Details  Name: [name] [name] MRN: [id number] Date of Birth: 09-29-1940  Subjective/Objective:      Updated Sunday Corn from [facility] that Ms Randleman is being discharged home this afternoon, and her next dose of ertapenem is due tomorrow at [facility] has arranged for a home health nurse to be at the home at approximately 1pm tomorrow, and for the [facility] to deliver the IV ertapenem to Ms [facility] home tomorrow morning.               Action/Plan:   Expected Discharge Date:                  Expected Discharge Plan:     In-House Referral:     Discharge planning Services     Post Acute Care Choice:    Choice offered to:     DME Arranged:    DME Agency:     HH Arranged:    HH Agency:     Status of Service:     If discussed at [employer] of Stay Meetings, dates discussed:    Additional Comments:  Kamsiyochukwu Buist A, RN 12/07/2015, 3:00 PM

## 2015-12-08 DIAGNOSIS — J449 Chronic obstructive pulmonary disease, unspecified: Secondary | ICD-10-CM | POA: Diagnosis not present

## 2015-12-08 DIAGNOSIS — B9629 Other Escherichia coli [E. coli] as the cause of diseases classified elsewhere: Secondary | ICD-10-CM | POA: Diagnosis not present

## 2015-12-08 DIAGNOSIS — D638 Anemia in other chronic diseases classified elsewhere: Secondary | ICD-10-CM | POA: Diagnosis not present

## 2015-12-08 DIAGNOSIS — I110 Hypertensive heart disease with heart failure: Secondary | ICD-10-CM | POA: Diagnosis not present

## 2015-12-08 DIAGNOSIS — I509 Heart failure, unspecified: Secondary | ICD-10-CM | POA: Diagnosis not present

## 2015-12-08 DIAGNOSIS — G629 Polyneuropathy, unspecified: Secondary | ICD-10-CM | POA: Diagnosis not present

## 2015-12-08 DIAGNOSIS — Z452 Encounter for adjustment and management of vascular access device: Secondary | ICD-10-CM | POA: Diagnosis not present

## 2015-12-08 DIAGNOSIS — A4189 Other specified sepsis: Secondary | ICD-10-CM | POA: Diagnosis not present

## 2015-12-08 DIAGNOSIS — N390 Urinary tract infection, site not specified: Secondary | ICD-10-CM | POA: Diagnosis not present

## 2015-12-08 DIAGNOSIS — B9620 Unspecified Escherichia coli [E. coli] as the cause of diseases classified elsewhere: Secondary | ICD-10-CM | POA: Diagnosis not present

## 2015-12-08 DIAGNOSIS — N179 Acute kidney failure, unspecified: Secondary | ICD-10-CM | POA: Diagnosis not present

## 2015-12-10 DIAGNOSIS — G629 Polyneuropathy, unspecified: Secondary | ICD-10-CM | POA: Diagnosis not present

## 2015-12-10 DIAGNOSIS — I509 Heart failure, unspecified: Secondary | ICD-10-CM | POA: Diagnosis not present

## 2015-12-10 DIAGNOSIS — B9620 Unspecified Escherichia coli [E. coli] as the cause of diseases classified elsewhere: Secondary | ICD-10-CM | POA: Diagnosis not present

## 2015-12-10 DIAGNOSIS — Z452 Encounter for adjustment and management of vascular access device: Secondary | ICD-10-CM | POA: Diagnosis not present

## 2015-12-10 DIAGNOSIS — N390 Urinary tract infection, site not specified: Secondary | ICD-10-CM | POA: Diagnosis not present

## 2015-12-10 DIAGNOSIS — D638 Anemia in other chronic diseases classified elsewhere: Secondary | ICD-10-CM | POA: Diagnosis not present

## 2015-12-10 DIAGNOSIS — I110 Hypertensive heart disease with heart failure: Secondary | ICD-10-CM | POA: Diagnosis not present

## 2015-12-10 DIAGNOSIS — Z1612 Extended spectrum beta lactamase (ESBL) resistance: Secondary | ICD-10-CM | POA: Diagnosis not present

## 2015-12-10 DIAGNOSIS — N179 Acute kidney failure, unspecified: Secondary | ICD-10-CM | POA: Diagnosis not present

## 2015-12-10 DIAGNOSIS — J449 Chronic obstructive pulmonary disease, unspecified: Secondary | ICD-10-CM | POA: Diagnosis not present

## 2015-12-13 DIAGNOSIS — N390 Urinary tract infection, site not specified: Secondary | ICD-10-CM | POA: Diagnosis not present

## 2015-12-13 DIAGNOSIS — Z1581 Genetic susceptibility to multiple endocrine neoplasia [MEN]: Secondary | ICD-10-CM | POA: Diagnosis not present

## 2015-12-13 DIAGNOSIS — Z452 Encounter for adjustment and management of vascular access device: Secondary | ICD-10-CM | POA: Diagnosis not present

## 2015-12-13 DIAGNOSIS — B9620 Unspecified Escherichia coli [E. coli] as the cause of diseases classified elsewhere: Secondary | ICD-10-CM | POA: Diagnosis not present

## 2015-12-13 DIAGNOSIS — N179 Acute kidney failure, unspecified: Secondary | ICD-10-CM | POA: Diagnosis not present

## 2015-12-13 DIAGNOSIS — J449 Chronic obstructive pulmonary disease, unspecified: Secondary | ICD-10-CM | POA: Diagnosis not present

## 2015-12-13 DIAGNOSIS — I509 Heart failure, unspecified: Secondary | ICD-10-CM | POA: Diagnosis not present

## 2015-12-13 DIAGNOSIS — I110 Hypertensive heart disease with heart failure: Secondary | ICD-10-CM | POA: Diagnosis not present

## 2015-12-13 DIAGNOSIS — G629 Polyneuropathy, unspecified: Secondary | ICD-10-CM | POA: Diagnosis not present

## 2015-12-13 DIAGNOSIS — D638 Anemia in other chronic diseases classified elsewhere: Secondary | ICD-10-CM | POA: Diagnosis not present

## 2015-12-17 DIAGNOSIS — I509 Heart failure, unspecified: Secondary | ICD-10-CM | POA: Diagnosis not present

## 2015-12-17 DIAGNOSIS — G629 Polyneuropathy, unspecified: Secondary | ICD-10-CM | POA: Diagnosis not present

## 2015-12-17 DIAGNOSIS — N179 Acute kidney failure, unspecified: Secondary | ICD-10-CM | POA: Diagnosis not present

## 2015-12-17 DIAGNOSIS — B9620 Unspecified Escherichia coli [E. coli] as the cause of diseases classified elsewhere: Secondary | ICD-10-CM | POA: Diagnosis not present

## 2015-12-17 DIAGNOSIS — I110 Hypertensive heart disease with heart failure: Secondary | ICD-10-CM | POA: Diagnosis not present

## 2015-12-17 DIAGNOSIS — Z452 Encounter for adjustment and management of vascular access device: Secondary | ICD-10-CM | POA: Diagnosis not present

## 2015-12-17 DIAGNOSIS — I5022 Chronic systolic (congestive) heart failure: Secondary | ICD-10-CM | POA: Diagnosis not present

## 2015-12-17 DIAGNOSIS — N390 Urinary tract infection, site not specified: Secondary | ICD-10-CM | POA: Diagnosis not present

## 2015-12-17 DIAGNOSIS — J449 Chronic obstructive pulmonary disease, unspecified: Secondary | ICD-10-CM | POA: Diagnosis not present

## 2015-12-17 DIAGNOSIS — D638 Anemia in other chronic diseases classified elsewhere: Secondary | ICD-10-CM | POA: Diagnosis not present

## 2015-12-17 DIAGNOSIS — R0602 Shortness of breath: Secondary | ICD-10-CM | POA: Diagnosis not present

## 2015-12-19 DIAGNOSIS — I509 Heart failure, unspecified: Secondary | ICD-10-CM | POA: Diagnosis not present

## 2015-12-19 DIAGNOSIS — Z452 Encounter for adjustment and management of vascular access device: Secondary | ICD-10-CM | POA: Diagnosis not present

## 2015-12-19 DIAGNOSIS — B9620 Unspecified Escherichia coli [E. coli] as the cause of diseases classified elsewhere: Secondary | ICD-10-CM | POA: Diagnosis not present

## 2015-12-19 DIAGNOSIS — N390 Urinary tract infection, site not specified: Secondary | ICD-10-CM | POA: Diagnosis not present

## 2015-12-19 DIAGNOSIS — G629 Polyneuropathy, unspecified: Secondary | ICD-10-CM | POA: Diagnosis not present

## 2015-12-19 DIAGNOSIS — J449 Chronic obstructive pulmonary disease, unspecified: Secondary | ICD-10-CM | POA: Diagnosis not present

## 2015-12-19 DIAGNOSIS — N179 Acute kidney failure, unspecified: Secondary | ICD-10-CM | POA: Diagnosis not present

## 2015-12-19 DIAGNOSIS — I110 Hypertensive heart disease with heart failure: Secondary | ICD-10-CM | POA: Diagnosis not present

## 2015-12-26 DIAGNOSIS — I509 Heart failure, unspecified: Secondary | ICD-10-CM | POA: Diagnosis not present

## 2015-12-26 DIAGNOSIS — N390 Urinary tract infection, site not specified: Secondary | ICD-10-CM | POA: Diagnosis not present

## 2015-12-26 DIAGNOSIS — G629 Polyneuropathy, unspecified: Secondary | ICD-10-CM | POA: Diagnosis not present

## 2015-12-26 DIAGNOSIS — I110 Hypertensive heart disease with heart failure: Secondary | ICD-10-CM | POA: Diagnosis not present

## 2015-12-26 DIAGNOSIS — J449 Chronic obstructive pulmonary disease, unspecified: Secondary | ICD-10-CM | POA: Diagnosis not present

## 2015-12-26 DIAGNOSIS — D638 Anemia in other chronic diseases classified elsewhere: Secondary | ICD-10-CM | POA: Diagnosis not present

## 2015-12-26 DIAGNOSIS — B9620 Unspecified Escherichia coli [E. coli] as the cause of diseases classified elsewhere: Secondary | ICD-10-CM | POA: Diagnosis not present

## 2015-12-26 DIAGNOSIS — Z452 Encounter for adjustment and management of vascular access device: Secondary | ICD-10-CM | POA: Diagnosis not present

## 2015-12-26 DIAGNOSIS — N179 Acute kidney failure, unspecified: Secondary | ICD-10-CM | POA: Diagnosis not present

## 2015-12-27 DIAGNOSIS — D638 Anemia in other chronic diseases classified elsewhere: Secondary | ICD-10-CM | POA: Diagnosis not present

## 2015-12-27 DIAGNOSIS — Z452 Encounter for adjustment and management of vascular access device: Secondary | ICD-10-CM | POA: Diagnosis not present

## 2015-12-27 DIAGNOSIS — I509 Heart failure, unspecified: Secondary | ICD-10-CM | POA: Diagnosis not present

## 2015-12-27 DIAGNOSIS — I110 Hypertensive heart disease with heart failure: Secondary | ICD-10-CM | POA: Diagnosis not present

## 2015-12-27 DIAGNOSIS — N390 Urinary tract infection, site not specified: Secondary | ICD-10-CM | POA: Diagnosis not present

## 2015-12-27 DIAGNOSIS — N179 Acute kidney failure, unspecified: Secondary | ICD-10-CM | POA: Diagnosis not present

## 2015-12-27 DIAGNOSIS — J449 Chronic obstructive pulmonary disease, unspecified: Secondary | ICD-10-CM | POA: Diagnosis not present

## 2015-12-27 DIAGNOSIS — B9620 Unspecified Escherichia coli [E. coli] as the cause of diseases classified elsewhere: Secondary | ICD-10-CM | POA: Diagnosis not present

## 2015-12-27 DIAGNOSIS — G629 Polyneuropathy, unspecified: Secondary | ICD-10-CM | POA: Diagnosis not present

## 2015-12-31 DIAGNOSIS — G629 Polyneuropathy, unspecified: Secondary | ICD-10-CM | POA: Diagnosis not present

## 2015-12-31 DIAGNOSIS — D638 Anemia in other chronic diseases classified elsewhere: Secondary | ICD-10-CM | POA: Diagnosis not present

## 2015-12-31 DIAGNOSIS — N390 Urinary tract infection, site not specified: Secondary | ICD-10-CM | POA: Diagnosis not present

## 2015-12-31 DIAGNOSIS — I509 Heart failure, unspecified: Secondary | ICD-10-CM | POA: Diagnosis not present

## 2015-12-31 DIAGNOSIS — I110 Hypertensive heart disease with heart failure: Secondary | ICD-10-CM | POA: Diagnosis not present

## 2015-12-31 DIAGNOSIS — J449 Chronic obstructive pulmonary disease, unspecified: Secondary | ICD-10-CM | POA: Diagnosis not present

## 2015-12-31 DIAGNOSIS — B9620 Unspecified Escherichia coli [E. coli] as the cause of diseases classified elsewhere: Secondary | ICD-10-CM | POA: Diagnosis not present

## 2015-12-31 DIAGNOSIS — N179 Acute kidney failure, unspecified: Secondary | ICD-10-CM | POA: Diagnosis not present

## 2015-12-31 DIAGNOSIS — Z452 Encounter for adjustment and management of vascular access device: Secondary | ICD-10-CM | POA: Diagnosis not present

## 2016-01-03 DIAGNOSIS — N390 Urinary tract infection, site not specified: Secondary | ICD-10-CM | POA: Diagnosis not present

## 2016-01-03 DIAGNOSIS — Z452 Encounter for adjustment and management of vascular access device: Secondary | ICD-10-CM | POA: Diagnosis not present

## 2016-01-03 DIAGNOSIS — J449 Chronic obstructive pulmonary disease, unspecified: Secondary | ICD-10-CM | POA: Diagnosis not present

## 2016-01-03 DIAGNOSIS — I110 Hypertensive heart disease with heart failure: Secondary | ICD-10-CM | POA: Diagnosis not present

## 2016-01-03 DIAGNOSIS — N179 Acute kidney failure, unspecified: Secondary | ICD-10-CM | POA: Diagnosis not present

## 2016-01-03 DIAGNOSIS — B9620 Unspecified Escherichia coli [E. coli] as the cause of diseases classified elsewhere: Secondary | ICD-10-CM | POA: Diagnosis not present

## 2016-01-03 DIAGNOSIS — G629 Polyneuropathy, unspecified: Secondary | ICD-10-CM | POA: Diagnosis not present

## 2016-01-03 DIAGNOSIS — D638 Anemia in other chronic diseases classified elsewhere: Secondary | ICD-10-CM | POA: Diagnosis not present

## 2016-01-03 DIAGNOSIS — I509 Heart failure, unspecified: Secondary | ICD-10-CM | POA: Diagnosis not present

## 2016-01-05 DIAGNOSIS — B9620 Unspecified Escherichia coli [E. coli] as the cause of diseases classified elsewhere: Secondary | ICD-10-CM | POA: Diagnosis not present

## 2016-01-05 DIAGNOSIS — I509 Heart failure, unspecified: Secondary | ICD-10-CM | POA: Diagnosis not present

## 2016-01-05 DIAGNOSIS — I110 Hypertensive heart disease with heart failure: Secondary | ICD-10-CM | POA: Diagnosis not present

## 2016-01-05 DIAGNOSIS — Z452 Encounter for adjustment and management of vascular access device: Secondary | ICD-10-CM | POA: Diagnosis not present

## 2016-01-05 DIAGNOSIS — N390 Urinary tract infection, site not specified: Secondary | ICD-10-CM | POA: Diagnosis not present

## 2016-01-05 DIAGNOSIS — D638 Anemia in other chronic diseases classified elsewhere: Secondary | ICD-10-CM | POA: Diagnosis not present

## 2016-01-05 DIAGNOSIS — G629 Polyneuropathy, unspecified: Secondary | ICD-10-CM | POA: Diagnosis not present

## 2016-01-05 DIAGNOSIS — N179 Acute kidney failure, unspecified: Secondary | ICD-10-CM | POA: Diagnosis not present

## 2016-01-05 DIAGNOSIS — J449 Chronic obstructive pulmonary disease, unspecified: Secondary | ICD-10-CM | POA: Diagnosis not present

## 2016-01-10 DIAGNOSIS — G629 Polyneuropathy, unspecified: Secondary | ICD-10-CM | POA: Diagnosis not present

## 2016-01-10 DIAGNOSIS — J449 Chronic obstructive pulmonary disease, unspecified: Secondary | ICD-10-CM | POA: Diagnosis not present

## 2016-01-10 DIAGNOSIS — Z452 Encounter for adjustment and management of vascular access device: Secondary | ICD-10-CM | POA: Diagnosis not present

## 2016-01-10 DIAGNOSIS — B9620 Unspecified Escherichia coli [E. coli] as the cause of diseases classified elsewhere: Secondary | ICD-10-CM | POA: Diagnosis not present

## 2016-01-10 DIAGNOSIS — N390 Urinary tract infection, site not specified: Secondary | ICD-10-CM | POA: Diagnosis not present

## 2016-01-10 DIAGNOSIS — I509 Heart failure, unspecified: Secondary | ICD-10-CM | POA: Diagnosis not present

## 2016-01-10 DIAGNOSIS — I110 Hypertensive heart disease with heart failure: Secondary | ICD-10-CM | POA: Diagnosis not present

## 2016-01-10 DIAGNOSIS — N179 Acute kidney failure, unspecified: Secondary | ICD-10-CM | POA: Diagnosis not present

## 2016-01-10 DIAGNOSIS — D638 Anemia in other chronic diseases classified elsewhere: Secondary | ICD-10-CM | POA: Diagnosis not present

## 2016-01-13 DIAGNOSIS — N179 Acute kidney failure, unspecified: Secondary | ICD-10-CM | POA: Diagnosis not present

## 2016-01-13 DIAGNOSIS — B9620 Unspecified Escherichia coli [E. coli] as the cause of diseases classified elsewhere: Secondary | ICD-10-CM | POA: Diagnosis not present

## 2016-01-13 DIAGNOSIS — I509 Heart failure, unspecified: Secondary | ICD-10-CM | POA: Diagnosis not present

## 2016-01-13 DIAGNOSIS — I110 Hypertensive heart disease with heart failure: Secondary | ICD-10-CM | POA: Diagnosis not present

## 2016-01-13 DIAGNOSIS — G629 Polyneuropathy, unspecified: Secondary | ICD-10-CM | POA: Diagnosis not present

## 2016-01-13 DIAGNOSIS — Z452 Encounter for adjustment and management of vascular access device: Secondary | ICD-10-CM | POA: Diagnosis not present

## 2016-01-13 DIAGNOSIS — N390 Urinary tract infection, site not specified: Secondary | ICD-10-CM | POA: Diagnosis not present

## 2016-01-13 DIAGNOSIS — J449 Chronic obstructive pulmonary disease, unspecified: Secondary | ICD-10-CM | POA: Diagnosis not present

## 2016-01-13 DIAGNOSIS — D638 Anemia in other chronic diseases classified elsewhere: Secondary | ICD-10-CM | POA: Diagnosis not present

## 2016-01-17 DIAGNOSIS — R0602 Shortness of breath: Secondary | ICD-10-CM | POA: Diagnosis not present

## 2016-01-17 DIAGNOSIS — I5022 Chronic systolic (congestive) heart failure: Secondary | ICD-10-CM | POA: Diagnosis not present

## 2016-01-21 DIAGNOSIS — I2510 Atherosclerotic heart disease of native coronary artery without angina pectoris: Secondary | ICD-10-CM | POA: Diagnosis not present

## 2016-01-21 DIAGNOSIS — I712 Thoracic aortic aneurysm, without rupture: Secondary | ICD-10-CM | POA: Diagnosis not present

## 2016-01-21 DIAGNOSIS — I5022 Chronic systolic (congestive) heart failure: Secondary | ICD-10-CM | POA: Diagnosis not present

## 2016-01-21 DIAGNOSIS — R0602 Shortness of breath: Secondary | ICD-10-CM | POA: Diagnosis not present

## 2016-01-21 DIAGNOSIS — J449 Chronic obstructive pulmonary disease, unspecified: Secondary | ICD-10-CM | POA: Diagnosis not present

## 2016-01-21 DIAGNOSIS — Z0000 Encounter for general adult medical examination without abnormal findings: Secondary | ICD-10-CM | POA: Diagnosis not present

## 2016-01-21 DIAGNOSIS — H6191 Disorder of right external ear, unspecified: Secondary | ICD-10-CM | POA: Diagnosis not present

## 2016-01-21 DIAGNOSIS — L659 Nonscarring hair loss, unspecified: Secondary | ICD-10-CM | POA: Diagnosis not present

## 2016-01-21 DIAGNOSIS — N390 Urinary tract infection, site not specified: Secondary | ICD-10-CM | POA: Diagnosis not present

## 2016-01-29 DIAGNOSIS — J449 Chronic obstructive pulmonary disease, unspecified: Secondary | ICD-10-CM | POA: Diagnosis not present

## 2016-01-29 DIAGNOSIS — D638 Anemia in other chronic diseases classified elsewhere: Secondary | ICD-10-CM | POA: Diagnosis not present

## 2016-01-29 DIAGNOSIS — L89320 Pressure ulcer of left buttock, unstageable: Secondary | ICD-10-CM | POA: Diagnosis not present

## 2016-01-29 DIAGNOSIS — I110 Hypertensive heart disease with heart failure: Secondary | ICD-10-CM | POA: Diagnosis not present

## 2016-01-29 DIAGNOSIS — M069 Rheumatoid arthritis, unspecified: Secondary | ICD-10-CM | POA: Diagnosis not present

## 2016-01-29 DIAGNOSIS — M797 Fibromyalgia: Secondary | ICD-10-CM | POA: Diagnosis not present

## 2016-01-29 DIAGNOSIS — N390 Urinary tract infection, site not specified: Secondary | ICD-10-CM | POA: Diagnosis not present

## 2016-01-29 DIAGNOSIS — G629 Polyneuropathy, unspecified: Secondary | ICD-10-CM | POA: Diagnosis not present

## 2016-01-29 DIAGNOSIS — I509 Heart failure, unspecified: Secondary | ICD-10-CM | POA: Diagnosis not present

## 2016-02-01 DIAGNOSIS — M19012 Primary osteoarthritis, left shoulder: Secondary | ICD-10-CM | POA: Diagnosis not present

## 2016-02-01 DIAGNOSIS — M4807 Spinal stenosis, lumbosacral region: Secondary | ICD-10-CM | POA: Diagnosis not present

## 2016-02-01 DIAGNOSIS — M545 Low back pain: Secondary | ICD-10-CM | POA: Diagnosis not present

## 2016-02-05 DIAGNOSIS — I509 Heart failure, unspecified: Secondary | ICD-10-CM | POA: Diagnosis not present

## 2016-02-05 DIAGNOSIS — M069 Rheumatoid arthritis, unspecified: Secondary | ICD-10-CM | POA: Diagnosis not present

## 2016-02-05 DIAGNOSIS — I110 Hypertensive heart disease with heart failure: Secondary | ICD-10-CM | POA: Diagnosis not present

## 2016-02-05 DIAGNOSIS — N390 Urinary tract infection, site not specified: Secondary | ICD-10-CM | POA: Diagnosis not present

## 2016-02-05 DIAGNOSIS — G629 Polyneuropathy, unspecified: Secondary | ICD-10-CM | POA: Diagnosis not present

## 2016-02-05 DIAGNOSIS — J449 Chronic obstructive pulmonary disease, unspecified: Secondary | ICD-10-CM | POA: Diagnosis not present

## 2016-02-05 DIAGNOSIS — L89320 Pressure ulcer of left buttock, unstageable: Secondary | ICD-10-CM | POA: Diagnosis not present

## 2016-02-05 DIAGNOSIS — D638 Anemia in other chronic diseases classified elsewhere: Secondary | ICD-10-CM | POA: Diagnosis not present

## 2016-02-05 DIAGNOSIS — M797 Fibromyalgia: Secondary | ICD-10-CM | POA: Diagnosis not present

## 2016-02-06 DIAGNOSIS — J209 Acute bronchitis, unspecified: Secondary | ICD-10-CM | POA: Diagnosis not present

## 2016-02-06 DIAGNOSIS — T148XXA Other injury of unspecified body region, initial encounter: Secondary | ICD-10-CM | POA: Diagnosis not present

## 2016-02-06 DIAGNOSIS — J440 Chronic obstructive pulmonary disease with acute lower respiratory infection: Secondary | ICD-10-CM | POA: Diagnosis not present

## 2016-02-06 DIAGNOSIS — L89894 Pressure ulcer of other site, stage 4: Secondary | ICD-10-CM | POA: Diagnosis not present

## 2016-02-06 DIAGNOSIS — I2510 Atherosclerotic heart disease of native coronary artery without angina pectoris: Secondary | ICD-10-CM | POA: Diagnosis not present

## 2016-02-06 DIAGNOSIS — I5022 Chronic systolic (congestive) heart failure: Secondary | ICD-10-CM | POA: Diagnosis not present

## 2016-02-06 DIAGNOSIS — C801 Malignant (primary) neoplasm, unspecified: Secondary | ICD-10-CM | POA: Diagnosis not present

## 2016-02-06 DIAGNOSIS — L089 Local infection of the skin and subcutaneous tissue, unspecified: Secondary | ICD-10-CM | POA: Diagnosis not present

## 2016-02-06 DIAGNOSIS — L89159 Pressure ulcer of sacral region, unspecified stage: Secondary | ICD-10-CM | POA: Diagnosis not present

## 2016-02-06 DIAGNOSIS — G603 Idiopathic progressive neuropathy: Secondary | ICD-10-CM | POA: Diagnosis not present

## 2016-02-08 DIAGNOSIS — J449 Chronic obstructive pulmonary disease, unspecified: Secondary | ICD-10-CM | POA: Diagnosis not present

## 2016-02-08 DIAGNOSIS — N390 Urinary tract infection, site not specified: Secondary | ICD-10-CM | POA: Diagnosis not present

## 2016-02-08 DIAGNOSIS — I110 Hypertensive heart disease with heart failure: Secondary | ICD-10-CM | POA: Diagnosis not present

## 2016-02-08 DIAGNOSIS — M797 Fibromyalgia: Secondary | ICD-10-CM | POA: Diagnosis not present

## 2016-02-08 DIAGNOSIS — L89320 Pressure ulcer of left buttock, unstageable: Secondary | ICD-10-CM | POA: Diagnosis not present

## 2016-02-08 DIAGNOSIS — M069 Rheumatoid arthritis, unspecified: Secondary | ICD-10-CM | POA: Diagnosis not present

## 2016-02-08 DIAGNOSIS — D638 Anemia in other chronic diseases classified elsewhere: Secondary | ICD-10-CM | POA: Diagnosis not present

## 2016-02-08 DIAGNOSIS — G629 Polyneuropathy, unspecified: Secondary | ICD-10-CM | POA: Diagnosis not present

## 2016-02-08 DIAGNOSIS — I509 Heart failure, unspecified: Secondary | ICD-10-CM | POA: Diagnosis not present

## 2016-02-11 DIAGNOSIS — G629 Polyneuropathy, unspecified: Secondary | ICD-10-CM | POA: Diagnosis not present

## 2016-02-11 DIAGNOSIS — N390 Urinary tract infection, site not specified: Secondary | ICD-10-CM | POA: Diagnosis not present

## 2016-02-11 DIAGNOSIS — J449 Chronic obstructive pulmonary disease, unspecified: Secondary | ICD-10-CM | POA: Diagnosis not present

## 2016-02-11 DIAGNOSIS — I509 Heart failure, unspecified: Secondary | ICD-10-CM | POA: Diagnosis not present

## 2016-02-11 DIAGNOSIS — D638 Anemia in other chronic diseases classified elsewhere: Secondary | ICD-10-CM | POA: Diagnosis not present

## 2016-02-11 DIAGNOSIS — I110 Hypertensive heart disease with heart failure: Secondary | ICD-10-CM | POA: Diagnosis not present

## 2016-02-11 DIAGNOSIS — M069 Rheumatoid arthritis, unspecified: Secondary | ICD-10-CM | POA: Diagnosis not present

## 2016-02-11 DIAGNOSIS — L89320 Pressure ulcer of left buttock, unstageable: Secondary | ICD-10-CM | POA: Diagnosis not present

## 2016-02-11 DIAGNOSIS — M797 Fibromyalgia: Secondary | ICD-10-CM | POA: Diagnosis not present

## 2016-02-14 DIAGNOSIS — N390 Urinary tract infection, site not specified: Secondary | ICD-10-CM | POA: Diagnosis not present

## 2016-02-14 DIAGNOSIS — D638 Anemia in other chronic diseases classified elsewhere: Secondary | ICD-10-CM | POA: Diagnosis not present

## 2016-02-14 DIAGNOSIS — G629 Polyneuropathy, unspecified: Secondary | ICD-10-CM | POA: Diagnosis not present

## 2016-02-14 DIAGNOSIS — I509 Heart failure, unspecified: Secondary | ICD-10-CM | POA: Diagnosis not present

## 2016-02-14 DIAGNOSIS — M797 Fibromyalgia: Secondary | ICD-10-CM | POA: Diagnosis not present

## 2016-02-14 DIAGNOSIS — J449 Chronic obstructive pulmonary disease, unspecified: Secondary | ICD-10-CM | POA: Diagnosis not present

## 2016-02-14 DIAGNOSIS — L89320 Pressure ulcer of left buttock, unstageable: Secondary | ICD-10-CM | POA: Diagnosis not present

## 2016-02-14 DIAGNOSIS — I110 Hypertensive heart disease with heart failure: Secondary | ICD-10-CM | POA: Diagnosis not present

## 2016-02-14 DIAGNOSIS — M069 Rheumatoid arthritis, unspecified: Secondary | ICD-10-CM | POA: Diagnosis not present

## 2016-02-17 DIAGNOSIS — R0602 Shortness of breath: Secondary | ICD-10-CM | POA: Diagnosis not present

## 2016-02-17 DIAGNOSIS — I5022 Chronic systolic (congestive) heart failure: Secondary | ICD-10-CM | POA: Diagnosis not present

## 2016-02-18 DIAGNOSIS — M069 Rheumatoid arthritis, unspecified: Secondary | ICD-10-CM | POA: Diagnosis not present

## 2016-02-18 DIAGNOSIS — I509 Heart failure, unspecified: Secondary | ICD-10-CM | POA: Diagnosis not present

## 2016-02-18 DIAGNOSIS — I110 Hypertensive heart disease with heart failure: Secondary | ICD-10-CM | POA: Diagnosis not present

## 2016-02-18 DIAGNOSIS — M797 Fibromyalgia: Secondary | ICD-10-CM | POA: Diagnosis not present

## 2016-02-18 DIAGNOSIS — L89320 Pressure ulcer of left buttock, unstageable: Secondary | ICD-10-CM | POA: Diagnosis not present

## 2016-02-18 DIAGNOSIS — N390 Urinary tract infection, site not specified: Secondary | ICD-10-CM | POA: Diagnosis not present

## 2016-02-18 DIAGNOSIS — G629 Polyneuropathy, unspecified: Secondary | ICD-10-CM | POA: Diagnosis not present

## 2016-02-18 DIAGNOSIS — J449 Chronic obstructive pulmonary disease, unspecified: Secondary | ICD-10-CM | POA: Diagnosis not present

## 2016-02-18 DIAGNOSIS — D638 Anemia in other chronic diseases classified elsewhere: Secondary | ICD-10-CM | POA: Diagnosis not present

## 2016-02-19 DIAGNOSIS — I2510 Atherosclerotic heart disease of native coronary artery without angina pectoris: Secondary | ICD-10-CM | POA: Diagnosis not present

## 2016-02-19 DIAGNOSIS — I5022 Chronic systolic (congestive) heart failure: Secondary | ICD-10-CM | POA: Diagnosis not present

## 2016-02-19 DIAGNOSIS — J449 Chronic obstructive pulmonary disease, unspecified: Secondary | ICD-10-CM | POA: Diagnosis not present

## 2016-02-19 DIAGNOSIS — I714 Abdominal aortic aneurysm, without rupture: Secondary | ICD-10-CM | POA: Diagnosis not present

## 2016-02-19 DIAGNOSIS — R42 Dizziness and giddiness: Secondary | ICD-10-CM | POA: Diagnosis not present

## 2016-02-19 DIAGNOSIS — G603 Idiopathic progressive neuropathy: Secondary | ICD-10-CM | POA: Diagnosis not present

## 2016-02-21 DIAGNOSIS — G629 Polyneuropathy, unspecified: Secondary | ICD-10-CM | POA: Diagnosis not present

## 2016-02-21 DIAGNOSIS — L89320 Pressure ulcer of left buttock, unstageable: Secondary | ICD-10-CM | POA: Diagnosis not present

## 2016-02-21 DIAGNOSIS — N390 Urinary tract infection, site not specified: Secondary | ICD-10-CM | POA: Diagnosis not present

## 2016-02-21 DIAGNOSIS — I110 Hypertensive heart disease with heart failure: Secondary | ICD-10-CM | POA: Diagnosis not present

## 2016-02-21 DIAGNOSIS — I509 Heart failure, unspecified: Secondary | ICD-10-CM | POA: Diagnosis not present

## 2016-02-21 DIAGNOSIS — M797 Fibromyalgia: Secondary | ICD-10-CM | POA: Diagnosis not present

## 2016-02-21 DIAGNOSIS — M069 Rheumatoid arthritis, unspecified: Secondary | ICD-10-CM | POA: Diagnosis not present

## 2016-02-21 DIAGNOSIS — J449 Chronic obstructive pulmonary disease, unspecified: Secondary | ICD-10-CM | POA: Diagnosis not present

## 2016-02-21 DIAGNOSIS — D638 Anemia in other chronic diseases classified elsewhere: Secondary | ICD-10-CM | POA: Diagnosis not present

## 2016-02-26 DIAGNOSIS — N390 Urinary tract infection, site not specified: Secondary | ICD-10-CM | POA: Diagnosis not present

## 2016-02-26 DIAGNOSIS — I509 Heart failure, unspecified: Secondary | ICD-10-CM | POA: Diagnosis not present

## 2016-02-26 DIAGNOSIS — M069 Rheumatoid arthritis, unspecified: Secondary | ICD-10-CM | POA: Diagnosis not present

## 2016-02-26 DIAGNOSIS — G629 Polyneuropathy, unspecified: Secondary | ICD-10-CM | POA: Diagnosis not present

## 2016-02-26 DIAGNOSIS — M797 Fibromyalgia: Secondary | ICD-10-CM | POA: Diagnosis not present

## 2016-02-26 DIAGNOSIS — L89320 Pressure ulcer of left buttock, unstageable: Secondary | ICD-10-CM | POA: Diagnosis not present

## 2016-02-26 DIAGNOSIS — J449 Chronic obstructive pulmonary disease, unspecified: Secondary | ICD-10-CM | POA: Diagnosis not present

## 2016-02-26 DIAGNOSIS — D638 Anemia in other chronic diseases classified elsewhere: Secondary | ICD-10-CM | POA: Diagnosis not present

## 2016-02-26 DIAGNOSIS — I110 Hypertensive heart disease with heart failure: Secondary | ICD-10-CM | POA: Diagnosis not present

## 2016-02-29 DIAGNOSIS — M069 Rheumatoid arthritis, unspecified: Secondary | ICD-10-CM | POA: Diagnosis not present

## 2016-02-29 DIAGNOSIS — G629 Polyneuropathy, unspecified: Secondary | ICD-10-CM | POA: Diagnosis not present

## 2016-02-29 DIAGNOSIS — M797 Fibromyalgia: Secondary | ICD-10-CM | POA: Diagnosis not present

## 2016-02-29 DIAGNOSIS — J449 Chronic obstructive pulmonary disease, unspecified: Secondary | ICD-10-CM | POA: Diagnosis not present

## 2016-02-29 DIAGNOSIS — I110 Hypertensive heart disease with heart failure: Secondary | ICD-10-CM | POA: Diagnosis not present

## 2016-02-29 DIAGNOSIS — I509 Heart failure, unspecified: Secondary | ICD-10-CM | POA: Diagnosis not present

## 2016-02-29 DIAGNOSIS — L89320 Pressure ulcer of left buttock, unstageable: Secondary | ICD-10-CM | POA: Diagnosis not present

## 2016-02-29 DIAGNOSIS — D638 Anemia in other chronic diseases classified elsewhere: Secondary | ICD-10-CM | POA: Diagnosis not present

## 2016-02-29 DIAGNOSIS — N390 Urinary tract infection, site not specified: Secondary | ICD-10-CM | POA: Diagnosis not present

## 2016-03-04 ENCOUNTER — Observation Stay
Admission: EM | Admit: 2016-03-04 | Discharge: 2016-03-06 | Disposition: A | Discharge status: Home / Self Care | Payer: Medicare HMO | Attending: Internal Medicine | Admitting: Internal Medicine

## 2016-03-04 ENCOUNTER — Encounter: Payer: Self-pay | Admitting: Medical Oncology

## 2016-03-04 DIAGNOSIS — Z881 Allergy status to other antibiotic agents status: Secondary | ICD-10-CM | POA: Insufficient documentation

## 2016-03-04 DIAGNOSIS — Z91041 Radiographic dye allergy status: Secondary | ICD-10-CM | POA: Insufficient documentation

## 2016-03-04 DIAGNOSIS — Z23 Encounter for immunization: Secondary | ICD-10-CM | POA: Diagnosis not present

## 2016-03-04 DIAGNOSIS — E785 Hyperlipidemia, unspecified: Secondary | ICD-10-CM | POA: Insufficient documentation

## 2016-03-04 DIAGNOSIS — B9620 Unspecified Escherichia coli [E. coli] as the cause of diseases classified elsewhere: Secondary | ICD-10-CM | POA: Insufficient documentation

## 2016-03-04 DIAGNOSIS — Z853 Personal history of malignant neoplasm of breast: Secondary | ICD-10-CM | POA: Insufficient documentation

## 2016-03-04 DIAGNOSIS — R4182 Altered mental status, unspecified: Secondary | ICD-10-CM

## 2016-03-04 DIAGNOSIS — M797 Fibromyalgia: Secondary | ICD-10-CM | POA: Diagnosis not present

## 2016-03-04 DIAGNOSIS — E860 Dehydration: Secondary | ICD-10-CM | POA: Insufficient documentation

## 2016-03-04 DIAGNOSIS — F17210 Nicotine dependence, cigarettes, uncomplicated: Secondary | ICD-10-CM | POA: Insufficient documentation

## 2016-03-04 DIAGNOSIS — G9341 Metabolic encephalopathy: Secondary | ICD-10-CM | POA: Diagnosis not present

## 2016-03-04 DIAGNOSIS — Z888 Allergy status to other drugs, medicaments and biological substances status: Secondary | ICD-10-CM | POA: Insufficient documentation

## 2016-03-04 DIAGNOSIS — Z87440 Personal history of urinary (tract) infections: Secondary | ICD-10-CM | POA: Insufficient documentation

## 2016-03-04 DIAGNOSIS — D638 Anemia in other chronic diseases classified elsewhere: Secondary | ICD-10-CM | POA: Diagnosis not present

## 2016-03-04 DIAGNOSIS — Z880 Allergy status to penicillin: Secondary | ICD-10-CM | POA: Insufficient documentation

## 2016-03-04 DIAGNOSIS — L89153 Pressure ulcer of sacral region, stage 3: Secondary | ICD-10-CM | POA: Diagnosis not present

## 2016-03-04 DIAGNOSIS — I110 Hypertensive heart disease with heart failure: Secondary | ICD-10-CM | POA: Diagnosis not present

## 2016-03-04 DIAGNOSIS — N390 Urinary tract infection, site not specified: Secondary | ICD-10-CM | POA: Diagnosis not present

## 2016-03-04 DIAGNOSIS — N289 Disorder of kidney and ureter, unspecified: Secondary | ICD-10-CM

## 2016-03-04 DIAGNOSIS — I10 Essential (primary) hypertension: Secondary | ICD-10-CM | POA: Diagnosis not present

## 2016-03-04 DIAGNOSIS — J449 Chronic obstructive pulmonary disease, unspecified: Secondary | ICD-10-CM | POA: Diagnosis not present

## 2016-03-04 DIAGNOSIS — Z882 Allergy status to sulfonamides status: Secondary | ICD-10-CM | POA: Diagnosis not present

## 2016-03-04 DIAGNOSIS — I509 Heart failure, unspecified: Secondary | ICD-10-CM | POA: Diagnosis not present

## 2016-03-04 DIAGNOSIS — Z7982 Long term (current) use of aspirin: Secondary | ICD-10-CM | POA: Insufficient documentation

## 2016-03-04 DIAGNOSIS — Z9981 Dependence on supplemental oxygen: Secondary | ICD-10-CM | POA: Insufficient documentation

## 2016-03-04 DIAGNOSIS — I5022 Chronic systolic (congestive) heart failure: Secondary | ICD-10-CM | POA: Diagnosis not present

## 2016-03-04 DIAGNOSIS — N179 Acute kidney failure, unspecified: Secondary | ICD-10-CM | POA: Diagnosis not present

## 2016-03-04 DIAGNOSIS — R410 Disorientation, unspecified: Secondary | ICD-10-CM

## 2016-03-04 DIAGNOSIS — G629 Polyneuropathy, unspecified: Secondary | ICD-10-CM | POA: Insufficient documentation

## 2016-03-04 DIAGNOSIS — M069 Rheumatoid arthritis, unspecified: Secondary | ICD-10-CM | POA: Diagnosis not present

## 2016-03-04 DIAGNOSIS — R531 Weakness: Secondary | ICD-10-CM | POA: Diagnosis not present

## 2016-03-04 DIAGNOSIS — L89320 Pressure ulcer of left buttock, unstageable: Secondary | ICD-10-CM | POA: Diagnosis not present

## 2016-03-04 DIAGNOSIS — L8990 Pressure ulcer of unspecified site, unspecified stage: Secondary | ICD-10-CM | POA: Insufficient documentation

## 2016-03-04 DIAGNOSIS — E871 Hypo-osmolality and hyponatremia: Secondary | ICD-10-CM | POA: Insufficient documentation

## 2016-03-04 LAB — CBC
HCT: 29.2 % — ABNORMAL LOW (ref 35.0–47.0)
HEMOGLOBIN: 9.9 g/dL — AB (ref 12.0–16.0)
MCH: 30.1 pg (ref 26.0–34.0)
MCHC: 33.8 g/dL (ref 32.0–36.0)
MCV: 89 fL (ref 80.0–100.0)
Platelets: 250 10*3/uL (ref 150–440)
RBC: 3.28 MIL/uL — AB (ref 3.80–5.20)
RDW: 16.3 % — ABNORMAL HIGH (ref 11.5–14.5)
WBC: 5.9 10*3/uL (ref 3.6–11.0)

## 2016-03-04 LAB — COMPREHENSIVE METABOLIC PANEL
ALK PHOS: 92 U/L (ref 38–126)
ALT: 7 U/L — ABNORMAL LOW (ref 14–54)
ANION GAP: 5 (ref 5–15)
AST: 20 U/L (ref 15–41)
Albumin: 3.2 g/dL — ABNORMAL LOW (ref 3.5–5.0)
BUN: 13 mg/dL (ref 6–20)
CALCIUM: 8.5 mg/dL — AB (ref 8.9–10.3)
CO2: 31 mmol/L (ref 22–32)
Chloride: 92 mmol/L — ABNORMAL LOW (ref 101–111)
Creatinine, Ser: 1.11 mg/dL — ABNORMAL HIGH (ref 0.44–1.00)
GFR, EST AFRICAN AMERICAN: 56 mL/min — AB (ref >60)
GFR, EST NON AFRICAN AMERICAN: 48 mL/min — AB (ref >60)
Glucose, Bld: 95 mg/dL (ref 65–99)
Potassium: 4.2 mmol/L (ref 3.5–5.1)
SODIUM: 128 mmol/L — AB (ref 135–145)
Total Bilirubin: 0.2 mg/dL — ABNORMAL LOW (ref 0.3–1.2)
Total Protein: 6.8 g/dL (ref 6.5–8.1)

## 2016-03-04 LAB — URINALYSIS, COMPLETE (UACMP) WITH MICROSCOPIC
BILIRUBIN URINE: NEGATIVE
GLUCOSE, UA: NEGATIVE mg/dL
HGB URINE DIPSTICK: NEGATIVE
KETONES UR: NEGATIVE mg/dL
NITRITE: POSITIVE — AB
PROTEIN: NEGATIVE mg/dL
Specific Gravity, Urine: 1.006 (ref 1.005–1.030)
pH: 6 (ref 5.0–8.0)

## 2016-03-04 MED ORDER — INFLUENZA VAC SPLIT QUAD 0.5 ML IM SUSY
0.5000 mL | PREFILLED_SYRINGE | INTRAMUSCULAR | Status: AC
  Administered 2016-03-05: 0.5 mL via INTRAMUSCULAR
  Filled 2016-03-04 (×2): qty 0.5

## 2016-03-04 MED ORDER — POLYSACCHARIDE IRON COMPLEX 150 MG PO CAPS
150.0000 mg | ORAL_CAPSULE | Freq: Every day | ORAL | Status: DC
Start: 2016-03-05 — End: 2016-03-06
  Administered 2016-03-05 – 2016-03-06 (×2): 150 mg via ORAL
  Filled 2016-03-04 (×3): qty 1

## 2016-03-04 MED ORDER — SODIUM CHLORIDE 0.9 % IV SOLN
1.0000 g | INTRAVENOUS | Status: DC
  Administered 2016-03-05 – 2016-03-06 (×2): 1 g via INTRAVENOUS
  Filled 2016-03-04 (×2): qty 1

## 2016-03-04 MED ORDER — ACETAMINOPHEN 325 MG PO TABS: 650.0000 mg | ORAL_TABLET | Freq: Four times a day (QID) | ORAL | Status: DC | PRN

## 2016-03-04 MED ORDER — CLONAZEPAM 1 MG PO TABS: 1.0000 mg | ORAL_TABLET | Freq: Two times a day (BID) | ORAL | Status: DC | PRN

## 2016-03-04 MED ORDER — ONDANSETRON HCL 4 MG PO TABS: 4.0000 mg | ORAL_TABLET | Freq: Four times a day (QID) | ORAL | Status: DC | PRN

## 2016-03-04 MED ORDER — TRAMADOL HCL 50 MG PO TABS
100.0000 mg | ORAL_TABLET | Freq: Three times a day (TID) | ORAL | Status: DC | PRN
  Administered 2016-03-06: 12:00:00 100 mg via ORAL
  Filled 2016-03-04: qty 2

## 2016-03-04 MED ORDER — SODIUM CHLORIDE 0.9 % IV SOLN
INTRAVENOUS | Status: DC
  Administered 2016-03-04 – 2016-03-06 (×4): via INTRAVENOUS

## 2016-03-04 MED ORDER — CARVEDILOL 12.5 MG PO TABS
12.5000 mg | ORAL_TABLET | Freq: Two times a day (BID) | ORAL | Status: DC
  Administered 2016-03-05 – 2016-03-06 (×4): 12.5 mg via ORAL
  Filled 2016-03-04 (×4): qty 1

## 2016-03-04 MED ORDER — ENOXAPARIN SODIUM 40 MG/0.4ML ~~LOC~~ SOLN
40.0000 mg | SUBCUTANEOUS | Status: DC
  Administered 2016-03-04 – 2016-03-05 (×2): 40 mg via SUBCUTANEOUS
  Filled 2016-03-04 (×2): qty 0.4

## 2016-03-04 MED ORDER — IMIPRAMINE HCL 25 MG PO TABS
25.0000 mg | ORAL_TABLET | Freq: Every day | ORAL | Status: DC
  Administered 2016-03-04 – 2016-03-05 (×2): 25 mg via ORAL
  Filled 2016-03-04 (×2): qty 1

## 2016-03-04 MED ORDER — ZOLPIDEM TARTRATE 5 MG PO TABS: 5.0000 mg | ORAL_TABLET | Freq: Every evening | ORAL | Status: DC | PRN

## 2016-03-04 MED ORDER — ONDANSETRON HCL 4 MG/2ML IJ SOLN: 4.0000 mg | Freq: Four times a day (QID) | INTRAMUSCULAR | Status: DC | PRN

## 2016-03-04 MED ORDER — SODIUM CHLORIDE 0.9 % IV SOLN
1.0000 g | Freq: Once | INTRAVENOUS | Status: AC
  Administered 2016-03-04: 1 g via INTRAVENOUS
  Filled 2016-03-04 (×2): qty 1

## 2016-03-04 MED ORDER — ASPIRIN EC 325 MG PO TBEC
325.0000 mg | DELAYED_RELEASE_TABLET | Freq: Every day | ORAL | Status: DC
  Administered 2016-03-04 – 2016-03-05 (×2): 325 mg via ORAL
  Filled 2016-03-04 (×3): qty 1

## 2016-03-04 MED ORDER — ATORVASTATIN CALCIUM 20 MG PO TABS
40.0000 mg | ORAL_TABLET | Freq: Every day | ORAL | Status: DC
  Administered 2016-03-04 – 2016-03-06 (×3): 40 mg via ORAL
  Filled 2016-03-04 (×3): qty 2

## 2016-03-04 MED ORDER — GABAPENTIN 600 MG PO TABS
600.0000 mg | ORAL_TABLET | Freq: Three times a day (TID) | ORAL | Status: DC
  Administered 2016-03-04 – 2016-03-06 (×6): 600 mg via ORAL
  Filled 2016-03-04 (×6): qty 1

## 2016-03-04 MED ORDER — FUROSEMIDE 20 MG PO TABS
20.0000 mg | ORAL_TABLET | ORAL | Status: DC
Start: 2016-03-05 — End: 2016-03-06
  Administered 2016-03-05: 11:00:00 20 mg via ORAL
  Filled 2016-03-04 (×2): qty 1

## 2016-03-04 MED ORDER — HYDROCODONE-ACETAMINOPHEN 5-325 MG PO TABS
1.0000 | ORAL_TABLET | ORAL | Status: DC | PRN
  Administered 2016-03-05: 1 via ORAL
  Filled 2016-03-04: qty 1

## 2016-03-04 MED ORDER — ACETAMINOPHEN 650 MG RE SUPP: 650.0000 mg | Freq: Four times a day (QID) | RECTAL | Status: DC | PRN

## 2016-03-04 MED ORDER — ENSURE ENLIVE PO LIQD
237.0000 mL | Freq: Every day | ORAL | Status: DC
  Administered 2016-03-04: 22:00:00 237 mL via ORAL

## 2016-03-04 MED ORDER — LISINOPRIL 5 MG PO TABS
2.5000 mg | ORAL_TABLET | Freq: Every day | ORAL | Status: DC
  Administered 2016-03-04 – 2016-03-06 (×3): 2.5 mg via ORAL
  Filled 2016-03-04 (×3): qty 1

## 2016-03-04 NOTE — ED Notes (Signed)
Patient c/o urinary frequency. Denies pain at this time.

## 2016-03-04 NOTE — H&P (Signed)
[facility] [place] NAME: [name] [name]    MR#:  [id number]  DATE OF BIRTH:  1940/07/19  DATE OF ADMISSION:  03/04/2016  PRIMARY CARE PHYSICIAN: Tracie Harrier, MD   REQUESTING/REFERRING PHYSICIAN: Lorrin Goodell MD  CHIEF COMPLAINT:   Chief Complaint  Patient presents with  . Urinary Frequency  . Urinary Incontinence    HISTORY OF PRESENT ILLNESS: [name] [name]  is a 76 y.o. female with a known history of  Asthma, breast cancer, COPD, fibromyalgia, essential hypertension, neuropathy, history of urinary tract infection who is presenting to the emergency room with complaint of urinary frequency and urinary incontinence. Patient was admitted in November with a urinary tract infection with ESBL producing Escherichia coli. Patient about 4 days ago was diagnosed with UTI and was treated with Macrobid. She continued to have urinary frequency urgency. Also according to the patient's son she was forgetting the things that she had asked him so he brought her in for confusion. Patient is noted to have a urinary tract infection in the emergency room. PAST MEDICAL HISTORY:   Past Medical History:  Diagnosis Date  . Asthma   . Breast cancer ([place]) 2009   left  . Cancer ([place])   . CHF (congestive heart failure) ([place])   . CHF (congestive heart failure) ([place])   . Chronic UTI   . COPD (chronic obstructive pulmonary disease) ([place])   . Dizziness   . Fibromyalgia   . Hypertension   . Neuropathy ([place])   . Personal history of tobacco use, presenting hazards to health 05/17/2015  . Polyp, larynx   . RA (rheumatoid arthritis) ([place])   . Sinus infection    recent  . Stumbling gait    to the left  . Supplemental oxygen dependent    2.5l    PAST SURGICAL HISTORY: Past Surgical History:  Procedure Laterality Date  . ABDOMINAL HYSTERECTOMY    . BREAST LUMPECTOMY Left 2009   chemo and radiation  . CYST EXCISION Left 02/27/2015   Procedure: CYST  REMOVAL;  Surgeon: Hessie Knows, MD;  Location: [facility] ORS;  Service: Orthopedics;  Laterality: Left;  . EYE MUSCLE SURGERY Right    13 surgeries  . THUMB ARTHROSCOPY Left     SOCIAL HISTORY:  Social History  Substance Use Topics  . Smoking status: Current Every Day Smoker    Packs/day: 0.75    Years: 40.00    Types: Cigarettes  . Smokeless tobacco: Never Used  . Alcohol use No    FAMILY HISTORY:  Family History  Problem Relation Age of Onset  . Diabetes Father   . Stroke Father   . Heart attack Father   . CAD Sister     DRUG ALLERGIES:  Allergies  Allergen Reactions  . Contrast Media [Iodinated Diagnostic Agents] Other (See Comments)    Pt was sent to the ED following contrast media injection at [place]. Unknown reason. She has been premedicated since without complications. Pt to be premedicated prior to contrast media injections  . Ioxaglate Other (See Comments)    Pt was sent to the ED following contrast media injection at [place]. Unknown reason. She has been premedicated since without complications. Pt to be premedicated prior to contrast media injections  . Sulfa Antibiotics     Other reaction(s): Other (See Comments)  . Tetracycline Hives and Other (See Comments)  . White Petrolatum Other (See Comments)  . Amoxicillin-Pot Clavulanate Rash    Blisters in  mouth Other reaction(s): Unknown Blisters in mouth Blisters in mouth  . Tape Rash    REVIEW OF SYSTEMS:   CONSTITUTIONAL: No fever,Positive fatigue or weakness.  EYES: No blurred or double vision.  EARS, NOSE, AND THROAT: No tinnitus or ear pain.  RESPIRATORY: No cough, shortness of breath, wheezing or hemoptysis.  CARDIOVASCULAR: No chest pain, orthopnea, edema.  GASTROINTESTINAL: No nausea, vomiting, diarrhea or abdominal pain.  GENITOURINARY: No dysuria, hematuria. Positive urinary frequency and urgency ENDOCRINE: No polyuria, nocturia,  HEMATOLOGY: No anemia, easy bruising or  bleeding SKIN: No rash or lesion. MUSCULOSKELETAL: No joint pain or arthritis.   NEUROLOGIC: No tingling, numbness, weakness.  PSYCHIATRY: No anxiety or depression.   MEDICATIONS AT HOME:  Prior to Admission medications   Medication Sig Start Date End Date Taking? Authorizing Provider  carvedilol (COREG) 12.5 MG tablet Take 1 tablet (12.5 mg total) by mouth 2 (two) times daily with a meal. 11/29/15  Yes Alexis Hugelmeyer, DO  clonazePAM (KLONOPIN) 1 MG tablet Take 1 mg by mouth 2 (two) times daily as needed for anxiety.    Yes Historical Provider, MD  gabapentin (NEURONTIN) 600 MG tablet Take 600 mg by mouth 3 (three) times daily.   Yes Historical Provider, MD  imipramine (TOFRANIL) 25 MG tablet Take 1 tablet by mouth at bedtime.  09/21/15  Yes Historical Provider, MD  iron polysaccharides (FERREX 150) 150 MG capsule Take 150 mg by mouth daily.   Yes Historical Provider, MD  traMADol (ULTRAM) 50 MG tablet Take 100 mg by mouth every 8 (eight) hours.    Yes Historical Provider, MD  zolpidem (AMBIEN) 5 MG tablet Take 5 mg by mouth at bedtime.   Yes Historical Provider, MD  aspirin 325 MG EC tablet Take 1 tablet (325 mg total) by mouth daily. Patient not taking: Reported on 12/04/2015 11/30/15   Alexis Hugelmeyer, DO  atorvastatin (LIPITOR) 40 MG tablet Take 1 tablet (40 mg total) by mouth daily at 6 PM. Patient not taking: Reported on 03/04/2016 11/29/15   Alexis Hugelmeyer, DO  feeding supplement, ENSURE ENLIVE, (ENSURE ENLIVE) LIQD Take 237 mLs by mouth daily at 3 pm. 12/07/15   Bettey Costa, MD  furosemide (LASIX) 20 MG tablet Take 20 mg by mouth every other day.    Historical Provider, MD  lisinopril (PRINIVIL,ZESTRIL) 2.5 MG tablet Take 2.5 mg by mouth daily.    Historical Provider, MD      PHYSICAL EXAMINATION:   VITAL SIGNS: Blood pressure 120/67, pulse 87, temperature 98 F (36.7 C), temperature source Oral, resp. rate 17, height 5' (1.524 m), weight 107 lb (48.5 kg), SpO2 95  %.  GENERAL:  76 y.o.-year-old patient lying in the bed with no acute distress.  EYES: Pupils equal, round, reactive to light and accommodation. No scleral icterus. Extraocular muscles intact.  HEENT: Head atraumatic, normocephalic. Oropharynx and nasopharynx clear.  NECK:  Supple, no jugular venous distention. No thyroid enlargement, no tenderness.  LUNGS: Normal breath sounds bilaterally, no wheezing, rales,rhonchi or crepitation. No use of accessory muscles of respiration.  CARDIOVASCULAR: S1, S2 normal. No murmurs, rubs, or gallops.  ABDOMEN: Soft, nontender, nondistended. Bowel sounds present. No organomegaly or mass.  EXTREMITIES: No pedal edema, cyanosis, or clubbing.  NEUROLOGIC: Cranial nerves II through XII are intact. Muscle strength 5/5 in all extremities. Sensation intact. Gait not checked.  PSYCHIATRIC: The patient is alert and oriented x 3.  SKIN: No obvious rash, lesion, or ulcer.   LABORATORY PANEL:   CBC  Recent Labs  Lab 03/04/16 1420  WBC 5.9  HGB 9.9*  HCT 29.2*  PLT 250  MCV 89.0  MCH 30.1  MCHC 33.8  RDW 16.3*   ------------------------------------------------------------------------------------------------------------------  Chemistries   Recent Labs Lab 03/04/16 1420  NA 128*  K 4.2  CL 92*  CO2 31  GLUCOSE 95  BUN 13  CREATININE 1.11*  CALCIUM 8.5*  AST 20  ALT 7*  ALKPHOS 92  BILITOT 0.2*   ------------------------------------------------------------------------------------------------------------------ estimated creatinine clearance is 32.4 mL/min (by C-G formula based on SCr of 1.11 mg/dL (H)). ------------------------------------------------------------------------------------------------------------------ No results for input(s): TSH, T4TOTAL, T3FREE, THYROIDAB in the last 72 hours.  Invalid input(s): FREET3   Coagulation profile No results for input(s): INR, PROTIME in the last 168  hours. ------------------------------------------------------------------------------------------------------------------- No results for input(s): DDIMER in the last 72 hours. -------------------------------------------------------------------------------------------------------------------  Cardiac Enzymes No results for input(s): CKMB, TROPONINI, MYOGLOBIN in the last 168 hours.  Invalid input(s): CK ------------------------------------------------------------------------------------------------------------------ Invalid input(s): POCBNP  ---------------------------------------------------------------------------------------------------------------  Urinalysis    Component Value Date/Time   COLORURINE YELLOW (A) 03/04/2016 1423   APPEARANCEUR CLEAR (A) 03/04/2016 1423   APPEARANCEUR Cloudy 10/11/2012 0040   LABSPEC 1.006 03/04/2016 1423   LABSPEC 1.014 10/11/2012 0040   PHURINE 6.0 03/04/2016 1423   GLUCOSEU NEGATIVE 03/04/2016 1423   GLUCOSEU Negative 10/11/2012 0040   HGBUR NEGATIVE 03/04/2016 1423   BILIRUBINUR NEGATIVE 03/04/2016 1423   BILIRUBINUR Negative 10/11/2012 0040   KETONESUR NEGATIVE 03/04/2016 1423   PROTEINUR NEGATIVE 03/04/2016 1423   NITRITE POSITIVE (A) 03/04/2016 1423   LEUKOCYTESUR SMALL (A) 03/04/2016 1423   LEUKOCYTESUR 3+ 10/11/2012 0040     RADIOLOGY: No results found.  EKG: Orders placed or performed during the hospital encounter of 12/04/15  . EKG 12-Lead  . EKG 12-Lead  . EKG 12-Lead  . EKG 12-Lead  . ED EKG  . ED EKG    IMPRESSION AND PLAN: Patient is a 76 year old with history of ESBL producing Escherichia coli with been treated with oral antibiotics  1. UTI with previous cultures suggestive of ESBL UTI will treat with Invanz Follow-up urine cultures  2. Generalized weakness due to #1 as well as dehydration we'll give her IV fluids monitor her respiratory status  3. History of congestive heart failure currently compensated  we'll monitor respiratory status continue Lasix  4. Continue hypertension continue Coreg and lisinopril  5. Hyperlipidemia unspecified continue therapy with Lipitor  6. Miscellaneous we'll do Lovenox for DVT prophylaxis All the records are reviewed and case discussed with ED provider. Management plans discussed with the patient, family and they are in agreement.  CODE STATUS: Code Status History    Date Active Date Inactive Code Status Order ID Comments User Context   12/04/2015  5:28 PM 12/07/2015  6:58 PM Full Code [id number]  Lytle Butte, MD ED   11/15/2015  2:31 AM 11/29/2015  6:30 PM Full Code [id number]  Lance Coon, MD Inpatient       TOTAL TIME TAKING CARE OF THIS PATIENT: 55 minutes.    Dustin Flock M.D on 03/04/2016 at 5:07 PM  Between 7am to 6pm - Pager - [phone]  After 6pm go to [email or web address] - password [email or web address] [facility]  Office  [phone]  CC: Primary care physician; Tracie Harrier, MD

## 2016-03-04 NOTE — ED Provider Notes (Signed)
[facility] Emergency Department Provider Note  ____________________________________________   First MD Initiated Contact with Patient 03/04/16 1534     (approximate)  I have reviewed the triage vital signs and the nursing notes.   HISTORY  Chief Complaint Urinary Frequency and Urinary Incontinence   HPI ODEAL WELDEN is a 76 y.o. female who is complaining of urinary frequency who is accompanied by her son who says that the patient is also increasingly confused over the past 4 days. The patient was prescribed Macrobid on February 16 and has been taking it, per the patient. However, the patient reports increased urinary frequency as well as some episodes of incontinence. She denies any pain. She lives at home with her family.   Past Medical History:  Diagnosis Date  . Asthma   . Breast cancer ([place]) 2009   left  . Cancer ([place])   . CHF (congestive heart failure) ([place])   . CHF (congestive heart failure) ([place])   . Chronic UTI   . COPD (chronic obstructive pulmonary disease) ([place])   . Dizziness   . Fibromyalgia   . Hypertension   . Neuropathy ([place])   . Personal history of tobacco use, presenting hazards to health 05/17/2015  . Polyp, larynx   . RA (rheumatoid arthritis) ([place])   . Sinus infection    recent  . Stumbling gait    to the left  . Supplemental oxygen dependent    2.5l    Patient Active Problem List   Diagnosis Date Noted  . Acute renal failure (ARF) ([place]) 12/04/2015  . Malnutrition of moderate degree 11/24/2015  . Hyponatremia   . COPD (chronic obstructive pulmonary disease) ([place]) 11/15/2015  . Chronic systolic CHF (congestive heart failure) ([place]) 11/15/2015  . Personal history of tobacco use, presenting hazards to health 05/17/2015    Past Surgical History:  Procedure Laterality Date  . ABDOMINAL HYSTERECTOMY    . BREAST LUMPECTOMY Left 2009   chemo and radiation  . CYST EXCISION Left 02/27/2015   Procedure: CYST REMOVAL;   Surgeon: Hessie Knows, MD;  Location: [facility] ORS;  Service: Orthopedics;  Laterality: Left;  . EYE MUSCLE SURGERY Right    13 surgeries  . THUMB ARTHROSCOPY Left     Prior to Admission medications   Medication Sig Start Date End Date Taking? Authorizing Provider  aspirin 325 MG EC tablet Take 1 tablet (325 mg total) by mouth daily. Patient not taking: Reported on 12/04/2015 11/30/15   Alexis Hugelmeyer, DO  atorvastatin (LIPITOR) 40 MG tablet Take 1 tablet (40 mg total) by mouth daily at 6 PM. 11/29/15   Alexis Hugelmeyer, DO  carvedilol (COREG) 12.5 MG tablet Take 1 tablet (12.5 mg total) by mouth 2 (two) times daily with a meal. 11/29/15   Alexis Hugelmeyer, DO  clonazePAM (KLONOPIN) 1 MG tablet Take 1 mg by mouth 2 (two) times daily as needed for anxiety.     Historical Provider, MD  feeding supplement, ENSURE ENLIVE, (ENSURE ENLIVE) LIQD Take 237 mLs by mouth daily at 3 pm. 12/07/15   Bettey Costa, MD  furosemide (LASIX) 20 MG tablet Take 20 mg by mouth every other day.    Historical Provider, MD  gabapentin (NEURONTIN) 600 MG tablet Take 600 mg by mouth 3 (three) times daily.    Historical Provider, MD  imipramine (TOFRANIL) 25 MG tablet Take 1 tablet by mouth at bedtime.  09/21/15   Historical Provider, MD  lisinopril (PRINIVIL,ZESTRIL) 2.5 MG tablet Take 2.5 mg by  mouth daily.    Historical Provider, MD  traMADol (ULTRAM) 50 MG tablet Take 100 mg by mouth every 8 (eight) hours.     Historical Provider, MD  zolpidem (AMBIEN) 5 MG tablet Take 5 mg by mouth at bedtime.    Historical Provider, MD    Allergies Contrast media [iodinated diagnostic agents]; Ioxaglate; Sulfa antibiotics; Tetracycline; White petrolatum; Amoxicillin-pot clavulanate; and Tape  Family History  Problem Relation Age of Onset  . Diabetes Father   . Stroke Father   . Heart attack Father   . CAD Sister     Social History Social History  Substance Use Topics  . Smoking status: Current Every Day Smoker     Packs/day: 0.75    Years: 40.00    Types: Cigarettes  . Smokeless tobacco: Never Used  . Alcohol use No    Review of Systems Constitutional: No fever/chills Eyes: No visual changes. ENT: No sore throat. Cardiovascular: Denies chest pain. Respiratory: Denies shortness of breath. Gastrointestinal: No abdominal pain.  No nausea, no vomiting.  No diarrhea.  No constipation. Genitourinary: as above Musculoskeletal: Negative for back pain. Skin: Negative for rash. Neurological: Negative for headaches, focal weakness or numbness.  10-point ROS otherwise negative.  ____________________________________________   PHYSICAL EXAM:  VITAL SIGNS: ED Triage Vitals  Enc Vitals Group     BP 03/04/16 1554 138/62     Pulse Rate 03/04/16 1554 91     Resp 03/04/16 1554 18     Temp 03/04/16 1554 98 F (36.7 C)     Temp Source 03/04/16 1554 Oral     SpO2 03/04/16 1554 97 %     Weight 03/04/16 1420 107 lb (48.5 kg)     Height 03/04/16 1420 5' (1.524 m)     Head Circumference --      Peak Flow --      Pain Score --      Pain Loc --      Pain Edu? --      Excl. in [place]? --     Constitutional: Alert and oriented To self but says that she is in [facility] not the year is 58. Eyes: Conjunctivae are normal. PERRL. EOMI. Head: Atraumatic. Nose: No congestion/rhinnorhea.  Mouth/Throat: Mucous membranes are moist.   Neck: No stridor.   Cardiovascular: Normal rate, regular rhythm. Grossly normal heart sounds.   Respiratory: Normal respiratory effort.  No retractions. Lungs CTAB. Gastrointestinal: Soft and nontender. No distention.  Musculoskeletal: No lower extremity tenderness nor edema.  No joint effusions. Neurologic:  Normal speech and language. No gross focal neurologic deficits are appreciated.  Skin:  Skin is warm, dry and intact. No rash noted. Psychiatric: Mood and affect are normal. Speech and behavior are normal.  ____________________________________________   LABS (all labs  ordered are listed, but only abnormal results are displayed)  Labs Reviewed  COMPREHENSIVE METABOLIC PANEL - Abnormal; Notable for the following:       Result Value   Sodium 128 (*)    Chloride 92 (*)    Creatinine, Ser 1.11 (*)    Calcium 8.5 (*)    Albumin 3.2 (*)    ALT 7 (*)    Total Bilirubin 0.2 (*)    GFR calc non Af Amer 48 (*)    GFR calc Af Amer 56 (*)    All other components within normal limits  CBC - Abnormal; Notable for the following:    RBC 3.28 (*)    Hemoglobin 9.9 (*)  HCT 29.2 (*)    RDW 16.3 (*)    All other components within normal limits  URINALYSIS, COMPLETE (UACMP) WITH MICROSCOPIC - Abnormal; Notable for the following:    Color, Urine YELLOW (*)    APPearance CLEAR (*)    Nitrite POSITIVE (*)    Leukocytes, UA SMALL (*)    Bacteria, UA MANY (*)    Squamous Epithelial / LPF 0-5 (*)    All other components within normal limits  URINE CULTURE   ____________________________________________  EKG   ____________________________________________  RADIOLOGY   ____________________________________________   PROCEDURES  Procedure(s) performed:   Procedures  Critical Care performed:   ____________________________________________   INITIAL IMPRESSION / ASSESSMENT AND PLAN / ED COURSE  Pertinent labs & imaging results that were available during my care of the patient were reviewed by me and considered in my medical decision making (see chart for details).  ----------------------------------------- 4:46 PM on 03/04/2016 -----------------------------------------  Patient with urine that is nitrite positive with bacteria and white blood cells. Appears to have a urinary tract infection. She has had extended beta-lactam spectrum resistant bacteria in the past and has been on Invanz. She tolerated this antibiotic in the past. I'll restart her on his antibiotic. I discussed with her that she'll be admitted to the hospital because she is failing  outpatient treatment. Signed out to Dr. Posey Pronto. The patient as well as family are understanding the plan and willing to comply for admission. Patient does not meet sepsis criteria at this time. However, she does have a failure of outpatient anabiotic.      ____________________________________________   FINAL CLINICAL IMPRESSION(S) / ED DIAGNOSES  hyponatremia. Altered mental status. UTI.    NEW MEDICATIONS STARTED DURING THIS VISIT:  New Prescriptions   No medications on file     Note:  This document was prepared using Dragon voice recognition software and may include unintentional dictation errors.    Orbie Pyo, MD 03/04/16 [id number]

## 2016-03-04 NOTE — ED Triage Notes (Signed)
Per pts son pt has been having more confusion than usual and has been having urinary incontinence and frequency since Sunday. Confusion worsened yesterday. Pt alert and oriented x 3. Confusion to time.

## 2016-03-05 ENCOUNTER — Observation Stay: Payer: Medicare HMO

## 2016-03-05 DIAGNOSIS — N390 Urinary tract infection, site not specified: Secondary | ICD-10-CM | POA: Diagnosis not present

## 2016-03-05 DIAGNOSIS — A498 Other bacterial infections of unspecified site: Secondary | ICD-10-CM | POA: Diagnosis not present

## 2016-03-05 DIAGNOSIS — E871 Hypo-osmolality and hyponatremia: Secondary | ICD-10-CM | POA: Diagnosis not present

## 2016-03-05 DIAGNOSIS — L8990 Pressure ulcer of unspecified site, unspecified stage: Secondary | ICD-10-CM | POA: Insufficient documentation

## 2016-03-05 DIAGNOSIS — N189 Chronic kidney disease, unspecified: Secondary | ICD-10-CM | POA: Diagnosis not present

## 2016-03-05 DIAGNOSIS — G9341 Metabolic encephalopathy: Secondary | ICD-10-CM | POA: Diagnosis not present

## 2016-03-05 LAB — BASIC METABOLIC PANEL
Anion gap: 6 (ref 5–15)
BUN: 10 mg/dL (ref 6–20)
CALCIUM: 8.3 mg/dL — AB (ref 8.9–10.3)
CO2: 28 mmol/L (ref 22–32)
CREATININE: 1.02 mg/dL — AB (ref 0.44–1.00)
Chloride: 102 mmol/L (ref 101–111)
GFR calc Af Amer: >60 mL/min (ref >60)
GFR, EST NON AFRICAN AMERICAN: 53 mL/min — AB (ref >60)
GLUCOSE: 94 mg/dL (ref 65–99)
POTASSIUM: 4.2 mmol/L (ref 3.5–5.1)
SODIUM: 136 mmol/L (ref 135–145)

## 2016-03-05 LAB — CBC
HCT: 29.2 % — ABNORMAL LOW (ref 35.0–47.0)
Hemoglobin: 9.9 g/dL — ABNORMAL LOW (ref 12.0–16.0)
MCH: 30 pg (ref 26.0–34.0)
MCHC: 33.8 g/dL (ref 32.0–36.0)
MCV: 88.6 fL (ref 80.0–100.0)
PLATELETS: 251 10*3/uL (ref 150–440)
RBC: 3.3 MIL/uL — AB (ref 3.80–5.20)
RDW: 16.5 % — AB (ref 11.5–14.5)
WBC: 4.6 10*3/uL (ref 3.6–11.0)

## 2016-03-05 NOTE — Progress Notes (Signed)
[facility] [place] NAME: [name] [name]    MR#:  [id number]  DATE OF BIRTH:  Oct 17, 1940  SUBJECTIVE:  CHIEF COMPLAINT:   Chief Complaint  Patient presents with  . Urinary Frequency  . Urinary Incontinence   The patient is 76 year old female with past medical history significant for history of kidney asthma, breast cancer, so fibromyalgia, hypertension, neuropathy, who presents to the hospital with complaints of urinary frequency, urinary incontinence. Apparently, Patient was admitted in November 2017 with a ESBL Escherichia coli UTI. She was diagnosed with urinary tract infection about 4 days ago and initiated on Macrobid, however, continued to have urinary frequency and urgency and was admitted to the hospital due to confusion. The patient remains confused and not able to review systems, denies any pain    Review of Systems  Unable to perform ROS: Mental status change    VITAL SIGNS: Blood pressure (!) 97/50, pulse 83, temperature 98.6 F (37 C), temperature source Oral, resp. rate 20, height 5' (1.524 m), weight 48.5 kg (107 lb), SpO2 99 %.  PHYSICAL EXAMINATION:   GENERAL:  76 y.o.-year-old patient lying in the bed with no acute distress. Confused, he is not sure where she is, things that she is in [place], she tells me that she would like to go home since she needs to go to work in [place], [place]: Pupils equal, round, reactive to light and accommodation. No scleral icterus. Extraocular muscles intact.  HEENT: Head atraumatic, normocephalic. Oropharynx and nasopharynx clear.  NECK:  Supple, no jugular venous distention. No thyroid enlargement, no tenderness.  LUNGS: Normal breath sounds bilaterally, no wheezing, rales,rhonchi or crepitation. No use of accessory muscles of respiration.  CARDIOVASCULAR: S1, S2 normal. No murmurs, rubs, or gallops.  ABDOMEN: Soft, nontender, nondistended. Bowel sounds present. No  organomegaly or mass.  EXTREMITIES: No pedal edema, cyanosis, or clubbing.  NEUROLOGIC: Cranial nerves II through XII are intact. Muscle strength 5/5 in all extremities. Sensation intact. Gait not checked.  PSYCHIATRIC: The patient is somnolent, disoriented.  SKIN: No obvious rash, lesion, or ulcer.   ORDERS/RESULTS REVIEWED:   CBC  Recent Labs Lab 03/04/16 1420 03/05/16 0416  WBC 5.9 4.6  HGB 9.9* 9.9*  HCT 29.2* 29.2*  PLT 250 251  MCV 89.0 88.6  MCH 30.1 30.0  MCHC 33.8 33.8  RDW 16.3* 16.5*   ------------------------------------------------------------------------------------------------------------------  Chemistries   Recent Labs Lab 03/04/16 1420 03/05/16 0416  NA 128* 136  K 4.2 4.2  CL 92* 102  CO2 31 28  GLUCOSE 95 94  BUN 13 10  CREATININE 1.11* 1.02*  CALCIUM 8.5* 8.3*  AST 20  --   ALT 7*  --   ALKPHOS 92  --   BILITOT 0.2*  --    ------------------------------------------------------------------------------------------------------------------ estimated creatinine clearance is 35.3 mL/min (by C-G formula based on SCr of 1.02 mg/dL (H)). ------------------------------------------------------------------------------------------------------------------ No results for input(s): TSH, T4TOTAL, T3FREE, THYROIDAB in the last 72 hours.  Invalid input(s): FREET3  Cardiac Enzymes No results for input(s): CKMB, TROPONINI, MYOGLOBIN in the last 168 hours.  Invalid input(s): CK ------------------------------------------------------------------------------------------------------------------ Invalid input(s): POCBNP ---------------------------------------------------------------------------------------------------------------  RADIOLOGY: US Renal  Result Date: 03/05/2016 CLINICAL DATA:  Renal insufficiency. EXAM: RENAL / URINARY TRACT ULTRASOUND COMPLETE COMPARISON:  CT 10/03/2015 . FINDINGS: Right Kidney: Length: 9.9 cm. Increased echogenicity. Renal  cortical thinning and irregularity. No hydronephrosis. Vascular calcification. Left Kidney: Length: 9.5 cm. Increased echogenicity. Renal cortical thinning and irregularity. No hydronephrosis. Vascular calcification.  Bladder: Appears normal for degree of bladder distention. IMPRESSION: 1. Bilateral increased renal echogenicity and renal cortical thinning with irregularity. Findings consistent chronic medical renal disease with atrophy and scarring. Renal vascular calcification noted consistent with renal atherosclerotic vascular disease. 2. No acute abnormality. Electronically Signed   By: Marcello Moores  Register   On: 03/05/2016 10:19    EKG:  Orders placed or performed during the hospital encounter of 12/04/15  . EKG 12-Lead  . EKG 12-Lead  . EKG 12-Lead  . EKG 12-Lead  . ED EKG  . ED EKG    ASSESSMENT AND PLAN:  Active Problems:   UTI (urinary tract infection)   Pressure injury of skin  #1. Metabolic encephalopathy due to to urinary tract infection, supportive therapy, IV fluids, fall precautions #2. Urinary tract infection due to Escherichia coli, suspected ESBL infection, continue patient on Invanz, following culture results #3. Hyponatremia, resolved on IV fluids #4 acute renal insufficiency, improving, renal ultrasound was unremarkable, no hydronephrosis  Management plans discussed with the patient, family and they are in agreement.   DRUG ALLERGIES:  Allergies  Allergen Reactions  . Contrast Media [Iodinated Diagnostic Agents] Other (See Comments)    Pt was sent to the ED following contrast media injection at [facility]. Unknown reason. She has been premedicated since without complications. Pt to be premedicated prior to contrast media injections  . Ioxaglate Other (See Comments)    Pt was sent to the ED following contrast media injection at [facility]. Unknown reason. She has been premedicated since without complications. Pt to be premedicated prior to contrast  media injections  . Sulfa Antibiotics     Other reaction(s): Other (See Comments)  . Tetracycline Hives and Other (See Comments)  . White Petrolatum Other (See Comments)  . Amoxicillin-Pot Clavulanate Rash    Blisters in mouth Other reaction(s): Unknown Blisters in mouth Blisters in mouth  . Tape Rash    CODE STATUS:     Code Status Orders        Start     Ordered   03/04/16 1835  Full code  Continuous     03/04/16 1835    Code Status History    Date Active Date Inactive Code Status Order ID Comments User Context   12/04/2015  5:28 PM 12/07/2015  6:58 PM Full Code [id number]  Lytle Butte, MD ED   11/15/2015  2:31 AM 11/29/2015  6:30 PM Full Code [id number]  Lance Coon, MD Inpatient    Advance Directive Documentation   Flowsheet Row Most Recent Value  Type of Advance Directive  Living will  Pre-existing out of facility DNR order (yellow form or pink MOST form)  No data  "MOST" Form in Place?  No data      TOTAL TIME TAKING CARE OF THIS PATIENT: 35  minutes.    Theodoro Grist M.D on 03/05/2016 at 3:02 PM  Between 7am to 6pm - Pager - [phone]  After 6pm go to [email or web address] - password [email or web address] [facility]  Office  [phone]  CC: Primary care physician; Tracie Harrier, MD

## 2016-03-05 NOTE — Plan of Care (Signed)
Problem: Education: Goal: Knowledge of [facility] Education information/materials will improve Outcome: Progressing VSS, free of falls during shift.  Disoriented, confused at times during shift, mistaking O2 for IV, asking why she needs IV if it is not connected to anything.  Able to reorient.  Encouraged to turn, keep pressure off pressure injury.  No complaints overnight.  Standby assist to bathroom multiple times during shift, unsteady gait.  Call bell within reach, bed alarm on.  WCTM.

## 2016-03-05 NOTE — Care Management Obs Status (Signed)
[facility] NOTIFICATION   Patient Details  Name: [name] [name] MRN: [id number] Date of Birth: 09-15-1940   Medicare Observation Status Notification Given:  Yes    Shelbie Ammons, RN 03/05/2016, 10:44 AM

## 2016-03-05 NOTE — Evaluation (Signed)
Physical Therapy Evaluation Patient Details Name: [name] [name] MRN: [id number] DOB: 1940/08/05 Today's Date: 03/05/2016   History of Present Illness  pt is a 76 yo female admitted to [facility] due to increased urinary frequency and incontinence. Per hospitalist was diagnosed w/ UTI, also has a stage 3 pressure injury on sacrum. PMH includes; Astha, breast cancer, COPD, fibromyalgia, HTN, neuropathy, previous UTI's and E-coli bacterium    Clinical Impression  Pt awake, alert and slightly impulsive throughout PT eval, but able to safely follow verbal commands. Pt demonstrated good overall strength and ROM for functional tasks assessed and able to perform bed mobility and transfers w/ supervision. She does require cuing for safety and attempted to impulsively get out of bed but returned safely with cuing. Pt's blood pressure was low in standing, 85/50 but displayed no signs of orthostatics and was able to safely ambulate 200' (120' w/ RW and 25' wo AD) under PT supervision on 2 L/min of O2. Oxygen stayed above 93%. During ambulation pt able to ambulate w/o RW and displayed no LOB w/ decreased gait speed. Pt displays some higher level balance deficits and stated she has fallen 4 times in the past week. She will benefit from skilled PT to improve overall balance and prevent future falls, recommend outpatient PT following acute hospital stay and use of RW to prevent future falls.       Follow Up Recommendations Outpatient PT    Equipment Recommendations  None recommended by PT    Recommendations for Other Services       Precautions / Restrictions Precautions Precautions: Fall Restrictions Weight Bearing Restrictions: No      Mobility  Bed Mobility Overal bed mobility: Independent                Transfers Overall transfer level: Needs assistance   Transfers: Sit to/from Stand Sit to Stand: Supervision         General transfer comment: able to transfer from sitting to standing  s/o LOB, slightly impulsive and requires cuing/supervision for safety   Ambulation/Gait Ambulation/Gait assistance: Supervision Ambulation Distance (Feet): 200 Feet Assistive device: Rolling walker (2 wheeled) Gait Pattern/deviations: Step-through pattern;Trunk flexed;Decreased stride length     General Gait Details: ambulated w/ RW for 120' w/ no LOB, slight cuing for using RW, stated the RW caused her to drift to right, able to ambulate 79' w/o AD and no displayed no sings of drifting or LOB,   Stairs            Wheelchair Mobility    Modified Rankin (Stroke Patients Only)       Balance Overall balance assessment: Needs assistance Sitting-balance support: No upper extremity supported;Feet supported Sitting balance-Leahy Scale: Good Sitting balance - Comments: able to maintain seated posture w/o difficulty    Standing balance support: Bilateral upper extremity supported;During functional activity Standing balance-Leahy Scale: Good Standing balance comment: able to maintain upright posture in static stance, able to ambulate w/o reaching for support, has history of falling stated fell 4 times in last week Single Leg Stance - Right Leg: 1 (sec) Single Leg Stance - Left Leg: 2 (secs)                         Pertinent Vitals/Pain Pain Assessment: No/denies pain    Home Living Family/patient expects to be discharged to:: Private residence Living Arrangements: Children Available Help at Discharge: Available PRN/intermittently (son works during the day) Type of Home: House Home Access:  Level entry     Home Layout: One level Home Equipment: [place] - 2 wheels;Other (comment) (O2) Additional Comments: has a walker but states she never uses it    Prior Function Level of Independence: Independent         Comments: PT independent at baseline in household mobility and ADLs, enjoys going out in her yard with her two dogs      Hand Dominance         Extremity/Trunk Assessment   Upper Extremity Assessment Upper Extremity Assessment: Overall WFL for tasks assessed    Lower Extremity Assessment Lower Extremity Assessment: Overall WFL for tasks assessed       Communication   Communication: No difficulties  Cognition Arousal/Alertness: Awake/alert Behavior During Therapy: Impulsive Overall Cognitive Status: Within Functional Limits for tasks assessed                 General Comments: slighlty impulsive and agitated but able to follow commands     General Comments      Exercises     Assessment/Plan    PT Assessment Patient needs continued PT services  PT Problem List Decreased balance;Decreased safety awareness;Cardiopulmonary status limiting activity       PT Treatment Interventions DME instruction;Gait training;Balance training;Therapeutic exercise;Therapeutic activities;Functional mobility training;Patient/family education    PT Goals (Current goals can be found in the Care Plan section)  Acute Rehab PT Goals Patient Stated Goal: Return home  PT Goal Formulation: With patient Time For Goal Achievement: 03/19/16 Potential to Achieve Goals: Good    Frequency Min 2X/week   Barriers to discharge        Co-evaluation               End of Session Equipment Utilized During Treatment: Gait belt;Oxygen Activity Tolerance: Patient tolerated treatment well Patient left: in bed;with call bell/phone within reach;with bed alarm set Nurse Communication: Mobility status PT Visit Diagnosis: History of falling (Z91.81)         Time: 7915-0413 PT Time Calculation (min) (ACUTE ONLY): 29 min   Charges:         PT G Codes:         Sheniqua Carolan Student PT 03/05/2016, 3:28 PM

## 2016-03-05 NOTE — Consult Note (Signed)
[facility] Nurse wound consult note Reason for Consult:Stage 3 pressure injury to sacrum/coccyx, present on admission  Wound type:pressure Pressure Injury POA: Yes Measurement:1 cm x 1 cm x 2 cm with undermining present circumferentially, extends 2 cm.   Wound EVO:JJKK moist nongranulating Drainage (amount, consistency, odor) minimal serosanguinous Periwound:intact Dressing procedure/placement/frequency:Cleanse wound to sacrum with NS.  Apply calcium alginate to wound bed, fill in dead space  Cover with silicone border foam.  Change every 3 days and PRN soilage.  Will not follow at this time.  Please re-consult if needed.  Domenic Moras RN BSN [place] Pager [phone]

## 2016-03-05 NOTE — Care Management (Signed)
Admitted to this facility under observation status with the diagnosis of urinary tract infection. Son Ronalee Belts lives in the home [phone]). Last seen Dr. Ginette Pitman 02/19/16. Takes care of all basic activities of daily living herself, can drive if needed. Home Health per IV antibiotics in the home per [facility] 12/07/15. Sent to [facility] from this facility 11/28/16, but refused to get out of the car 11/28/16. Home oxygen x 1 year, doesn't remember name of company. Wears oxygen 2.5 liters per nasal cannula continuous. Rolling walker in the home. Prescriptions are filled at [facility] in [place] x 4 prior to this admission. Decreased appetite. Lost 3 pounds. Son will transport. Shelbie Ammons RN MSN CCM Care Management

## 2016-03-06 ENCOUNTER — Encounter: Payer: Self-pay | Admitting: Student

## 2016-03-06 DIAGNOSIS — A498 Other bacterial infections of unspecified site: Secondary | ICD-10-CM | POA: Diagnosis not present

## 2016-03-06 DIAGNOSIS — E871 Hypo-osmolality and hyponatremia: Secondary | ICD-10-CM | POA: Diagnosis not present

## 2016-03-06 DIAGNOSIS — R410 Disorientation, unspecified: Secondary | ICD-10-CM

## 2016-03-06 DIAGNOSIS — N390 Urinary tract infection, site not specified: Secondary | ICD-10-CM | POA: Diagnosis not present

## 2016-03-06 DIAGNOSIS — N289 Disorder of kidney and ureter, unspecified: Secondary | ICD-10-CM

## 2016-03-06 LAB — URINE CULTURE

## 2016-03-06 MED ORDER — SODIUM CHLORIDE 0.9 % IV SOLN: 1.0000 g | INTRAVENOUS | 0 refills | Status: DC

## 2016-03-06 NOTE — Discharge Summary (Signed)
[facility] [place] NAME: [name] [name]    MR#:  [id number]  DATE OF BIRTH:  Aug 20, 1940  DATE OF ADMISSION:  03/04/2016 ADMITTING PHYSICIAN: Dustin Flock, MD  DATE OF DISCHARGE: No discharge date for patient encounter.  PRIMARY CARE PHYSICIAN: [name],[name], MD     ADMISSION DIAGNOSIS:  Urinary tract infection without hematuria, site unspecified [N39.0] Altered mental status, unspecified altered mental status type [R41.82]  DISCHARGE DIAGNOSIS:  Principal Problem:   UTI (urinary tract infection) Active Problems:   Acute delirium   Pressure injury of skin   Acute renal insufficiency   SECONDARY DIAGNOSIS:   Past Medical History:  Diagnosis Date  . Asthma   . Breast cancer ([place]) 2009   left  . Cancer ([place])   . CHF (congestive heart failure) ([place])   . CHF (congestive heart failure) ([place])   . Chronic UTI   . COPD (chronic obstructive pulmonary disease) ([place])   . Dizziness   . Fibromyalgia   . Hypertension   . Neuropathy ([place])   . Personal history of tobacco use, presenting hazards to health 05/17/2015  . Polyp, larynx   . RA (rheumatoid arthritis) ([place])   . Sinus infection    recent  . Stumbling gait    to the left  . Supplemental oxygen dependent    2.5l    .pro HOSPITAL COURSE:  The patient is 76 year old female with past medical history significant for history of COPD, chronic respiratory failure, breast cancer, fibromyalgia, hypertension, neuropathy, who presents to the hospital with complaints of urinary frequency, urinary incontinence. Apparently, Patient was admitted in November 2017 with a ESBL Escherichia coli UTI. She was diagnosed with urinary tract infection about 4 days ago and initiated on Macrobid, however, continued to have urinary frequency and urgency and was admitted to the hospital due to confusion. Urinary cultures grew Escherichia coli ESBL. The patient had PICC line placed and initiated on  ertapenem [place]. She is to continue antibiotic for total of 7 days. She is to follow-up with her primary care physician, ID specialist as outpatient. She was felt to be stable to be discharged home today Discussion by problem: #1. Delirium due to to urinary tract infection, resolved with antibiotic therapy, supportive care  #2. Urinary tract infection due to Escherichia coli, ESBL infection, continue patient on Invanz for 7 days, PICC line was placed #3. Hyponatremia, resolved on IV fluids #4 acute renal insufficiency, improved and IV fluids, renal ultrasound was unremarkable, no hydronephrosis, follow creatinine as outpatient DISCHARGE CONDITIONS:   Stable  CONSULTS OBTAINED:    DRUG ALLERGIES:   Allergies  Allergen Reactions  . Contrast Media [Iodinated Diagnostic Agents] Other (See Comments)    Pt was sent to the ED following contrast media injection at [facility]. Unknown reason. She has been premedicated since without complications. Pt to be premedicated prior to contrast media injections  . Ioxaglate Other (See Comments)    Pt was sent to the ED following contrast media injection at [facility]. Unknown reason. She has been premedicated since without complications. Pt to be premedicated prior to contrast media injections  . Sulfa Antibiotics     Other reaction(s): Other (See Comments)  . Tetracycline Hives and Other (See Comments)  . White Petrolatum Other (See Comments)  . Amoxicillin-Pot Clavulanate Rash    Blisters in mouth Other reaction(s): Unknown Blisters in mouth Blisters in mouth  . Tape Rash    DISCHARGE MEDICATIONS:   Current Discharge  Medication List    START taking these medications   Details  ertapenem 1 g in sodium chloride 0.9 % 50 mL Inject 1 g into the vein daily. Qty: 6 Dose, Refills: 0      CONTINUE these medications which have NOT CHANGED   Details  carvedilol (COREG) 12.5 MG tablet Take 1 tablet (12.5 mg total) by mouth 2 (two)  times daily with a meal. Qty: 60 tablet, Refills: 0    clonazePAM (KLONOPIN) 1 MG tablet Take 1 mg by mouth 2 (two) times daily as needed for anxiety.     gabapentin (NEURONTIN) 600 MG tablet Take 600 mg by mouth 3 (three) times daily.    imipramine (TOFRANIL) 25 MG tablet Take 1 tablet by mouth at bedtime.     iron polysaccharides (FERREX 150) 150 MG capsule Take 150 mg by mouth daily.    traMADol (ULTRAM) 50 MG tablet Take 100 mg by mouth every 8 (eight) hours.     zolpidem (AMBIEN) 5 MG tablet Take 5 mg by mouth at bedtime.    aspirin 325 MG EC tablet Take 1 tablet (325 mg total) by mouth daily. Qty: 30 tablet, Refills: 0    atorvastatin (LIPITOR) 40 MG tablet Take 1 tablet (40 mg total) by mouth daily at 6 PM. Qty: 30 tablet, Refills: 0    feeding supplement, ENSURE ENLIVE, (ENSURE ENLIVE) LIQD Take 237 mLs by mouth daily at 3 pm. Qty: 237 mL, Refills: 12    lisinopril (PRINIVIL,ZESTRIL) 2.5 MG tablet Take 2.5 mg by mouth daily.      STOP taking these medications     furosemide (LASIX) 20 MG tablet          DISCHARGE INSTRUCTIONS:    The patient is to follow-up with primary care physician as outpatient  If you experience worsening of your admission symptoms, develop shortness of breath, life threatening emergency, suicidal or homicidal thoughts you must seek medical attention immediately by calling 911 or calling your MD immediately  if symptoms less severe.  You Must read complete instructions/literature along with all the possible adverse reactions/side effects for all the Medicines you take and that have been prescribed to you. Take any new Medicines after you have completely understood and accept all the possible adverse reactions/side effects.   Please note  You were cared for by a hospitalist during your hospital stay. If you have any questions about your discharge medications or the care you received while you were in the hospital after you are discharged, you  can call the unit and asked to speak with the hospitalist on call if the hospitalist that took care of you is not available. Once you are discharged, your primary care physician will handle any further medical issues. Please note that NO REFILLS for any discharge medications will be authorized once you are discharged, as it is imperative that you return to your primary care physician (or establish a relationship with a primary care physician if you do not have one) for your aftercare needs so that they can reassess your need for medications and monitor your lab values.    Today   CHIEF COMPLAINT:   Chief Complaint  Patient presents with  . Urinary Frequency  . Urinary Incontinence    HISTORY OF PRESENT ILLNESS:  [name] [name]  is a 76 y.o. female with a known history of COPD, chronic respiratory failure, breast cancer, fibromyalgia, hypertension, neuropathy, who presents to the hospital with complaints of urinary frequency, urinary incontinence. Apparently,  Patient was admitted in November 2017 with a ESBL Escherichia coli UTI. She was diagnosed with urinary tract infection about 4 days ago and initiated on Macrobid, however, continued to have urinary frequency and urgency and was admitted to the hospital due to confusion. Urinary cultures grew Escherichia coli ESBL. The patient had PICC line placed and initiated on ertapenem Amma. She is to continue antibiotic for total of 7 days. She is to follow-up with her primary care physician, ID specialist as outpatient. She was felt to be stable to be discharged home today Discussion by problem: #1. Delirium due to to urinary tract infection, resolved with antibiotic therapy, supportive care  #2. Urinary tract infection due to Escherichia coli, ESBL infection, continue patient on Invanz for 7 days, PICC line was placed #3. Hyponatremia, resolved on IV fluids #4 acute renal insufficiency, improved and IV fluids, renal ultrasound was unremarkable, no  hydronephrosis, follow creatinine as outpatient    VITAL SIGNS:  Blood pressure (!) 123/43, pulse 79, temperature 98.1 F (36.7 C), temperature source Oral, resp. rate 16, height 5' (1.524 m), weight 48.5 kg (107 lb), SpO2 98 %.  I/O:   Intake/Output Summary (Last 24 hours) at 03/06/16 1715 Last data filed at 03/06/16 0418  Gross per 24 hour  Intake          1486.75 ml  Output                0 ml  Net          1486.75 ml    PHYSICAL EXAMINATION:  GENERAL:  76 y.o.-year-old patient lying in the bed with no acute distress.  EYES: Pupils equal, round, reactive to light and accommodation. No scleral icterus. Extraocular muscles intact.  HEENT: Head atraumatic, normocephalic. Oropharynx and nasopharynx clear.  NECK:  Supple, no jugular venous distention. No thyroid enlargement, no tenderness.  LUNGS: Normal breath sounds bilaterally, no wheezing, rales,rhonchi or crepitation. No use of accessory muscles of respiration.  CARDIOVASCULAR: S1, S2 normal. No murmurs, rubs, or gallops.  ABDOMEN: Soft, non-tender, non-distended. Bowel sounds present. No organomegaly or mass.  EXTREMITIES: No pedal edema, cyanosis, or clubbing.  NEUROLOGIC: Cranial nerves II through XII are intact. Muscle strength 5/5 in all extremities. Sensation intact. Gait not checked.  PSYCHIATRIC: The patient is alert and oriented x 3.  SKIN: No obvious rash, lesion, or ulcer.   DATA REVIEW:   CBC  Recent Labs Lab 03/05/16 0416  WBC 4.6  HGB 9.9*  HCT 29.2*  PLT 251    Chemistries   Recent Labs Lab 03/04/16 1420 03/05/16 0416  NA 128* 136  K 4.2 4.2  CL 92* 102  CO2 31 28  GLUCOSE 95 94  BUN 13 10  CREATININE 1.11* 1.02*  CALCIUM 8.5* 8.3*  AST 20  --   ALT 7*  --   ALKPHOS 92  --   BILITOT 0.2*  --     Cardiac Enzymes No results for input(s): TROPONINI in the last 168 hours.  Microbiology Results  Results for orders placed or performed during the hospital encounter of 03/04/16  Urine  culture     Status: Abnormal   Collection Time: 03/04/16  2:23 PM  Result Value Ref Range Status   Specimen Description URINE, RANDOM  Final   Special Requests NONE  Final   Culture (A)  Final    80,000 COLONIES/mL ESCHERICHIA COLI Confirmed Extended Spectrum Beta-Lactamase Producer (ESBL) Performed at [facility], [place] [address] [place], [place]  [address]    Report Status 03/06/2016 FINAL  Final   Organism ID, Bacteria ESCHERICHIA COLI (A)  Final      Susceptibility   Escherichia coli - MIC*    AMPICILLIN >=32 RESISTANT Resistant     CEFAZOLIN >=64 RESISTANT Resistant     CEFTRIAXONE >=64 RESISTANT Resistant     CIPROFLOXACIN >=4 RESISTANT Resistant     GENTAMICIN <=1 SENSITIVE Sensitive     IMIPENEM <=0.25 SENSITIVE Sensitive     NITROFURANTOIN 256 RESISTANT Resistant     TRIMETH/SULFA >=320 RESISTANT Resistant     AMPICILLIN/SULBACTAM >=32 RESISTANT Resistant     PIP/TAZO 16 SENSITIVE Sensitive     Extended ESBL POSITIVE Resistant     * 80,000 COLONIES/mL ESCHERICHIA COLI    RADIOLOGY:  US Renal  Result Date: 03/05/2016 CLINICAL DATA:  Renal insufficiency. EXAM: RENAL / URINARY TRACT ULTRASOUND COMPLETE COMPARISON:  CT 10/03/2015 . FINDINGS: Right Kidney: Length: 9.9 cm. Increased echogenicity. Renal cortical thinning and irregularity. No hydronephrosis. Vascular calcification. Left Kidney: Length: 9.5 cm. Increased echogenicity. Renal cortical thinning and irregularity. No hydronephrosis. Vascular calcification. Bladder: Appears normal for degree of bladder distention. IMPRESSION: 1. Bilateral increased renal echogenicity and renal cortical thinning with irregularity. Findings consistent chronic medical renal disease with atrophy and scarring. Renal vascular calcification noted consistent with renal atherosclerotic vascular disease. 2. No acute abnormality. Electronically Signed   By: Marcello Moores  Register   On: 03/05/2016 10:19    EKG:   Orders placed or performed during  the hospital encounter of 12/04/15  . EKG 12-Lead  . EKG 12-Lead  . EKG 12-Lead  . EKG 12-Lead  . ED EKG  . ED EKG      Management plans discussed with the patient, family and they are in agreement.  CODE STATUS:     Code Status Orders        Start     Ordered   03/04/16 1835  Full code  Continuous     03/04/16 1835    Code Status History    Date Active Date Inactive Code Status Order ID Comments User Context   12/04/2015  5:28 PM 12/07/2015  6:58 PM Full Code [id number]  Lytle Butte, MD ED   11/15/2015  2:31 AM 11/29/2015  6:30 PM Full Code [id number]  Lance Coon, MD Inpatient    Advance Directive Documentation   Flowsheet Row Most Recent Value  Type of Advance Directive  Living will  Pre-existing out of facility DNR order (yellow form or pink MOST form)  No data  "MOST" Form in Place?  No data      TOTAL TIME TAKING CARE OF THIS PATIENT: 40 minutes.    Theodoro Grist M.D on 03/06/2016 at 5:15 PM  Between 7am to 6pm - Pager - [phone]  After 6pm go to [email or web address] - password [email or web address] [facility]  Office  [phone]  CC: Primary care physician; Tracie Harrier, MD

## 2016-03-06 NOTE — Plan of Care (Signed)
Problem: Education: Goal: Knowledge of [facility] Education information/materials will improve Outcome: Progressing VSS, free of falls during shift.  Reported bilateral foot pain 10/10, improved to 6/10 w/ PRN Norco 5-'325mg'$ .  Pt asleep majority of shift after receiving Norco.  No other complaints overnight.  Call bell within reach, [place].

## 2016-03-06 NOTE — Care Management (Signed)
Discharge to home today per Dr. Ether Griffins. Will have PICC line placed today. Will get a dose of Invanz this afternoon then go home. Son will transport. [facility] is active in the home at present. [facility] will provide [facility] Invanz.  Shelbie Ammons RN MSN CCM Care Management

## 2016-03-06 NOTE — Progress Notes (Signed)
Patient Is discharge home with a single lumen PICC to RUA  In place for continue home iv anti -biotic therapy, flush and capped , dsg clean, dry and  Intact,care given , summary and f/u care given, left with a friend.

## 2016-03-07 DIAGNOSIS — J449 Chronic obstructive pulmonary disease, unspecified: Secondary | ICD-10-CM | POA: Diagnosis not present

## 2016-03-07 DIAGNOSIS — G629 Polyneuropathy, unspecified: Secondary | ICD-10-CM | POA: Diagnosis not present

## 2016-03-07 DIAGNOSIS — D638 Anemia in other chronic diseases classified elsewhere: Secondary | ICD-10-CM | POA: Diagnosis not present

## 2016-03-07 DIAGNOSIS — I110 Hypertensive heart disease with heart failure: Secondary | ICD-10-CM | POA: Diagnosis not present

## 2016-03-07 DIAGNOSIS — N390 Urinary tract infection, site not specified: Secondary | ICD-10-CM | POA: Diagnosis not present

## 2016-03-07 DIAGNOSIS — L89320 Pressure ulcer of left buttock, unstageable: Secondary | ICD-10-CM | POA: Diagnosis not present

## 2016-03-07 DIAGNOSIS — I509 Heart failure, unspecified: Secondary | ICD-10-CM | POA: Diagnosis not present

## 2016-03-07 DIAGNOSIS — M797 Fibromyalgia: Secondary | ICD-10-CM | POA: Diagnosis not present

## 2016-03-07 DIAGNOSIS — M069 Rheumatoid arthritis, unspecified: Secondary | ICD-10-CM | POA: Diagnosis not present

## 2016-03-07 MED ORDER — FOSFOMYCIN TROMETHAMINE 3 G PO PACK: 3.0000 g | PACK | ORAL | 0 refills | Status: AC

## 2016-03-07 NOTE — Progress Notes (Signed)
I was called earlier today about patient refusing co-pay for antibiotic ertapenem, which was ordered for Escherichia coli ESBL urinary tract infection. I discussed case with infectious disease specialist, Dr. Ola Spurr, who recommended fosfomycin 3 g orally every 2 days 2 doses to complete course. Prescription was sent to the pharmacy. Recommended to discontinue PICC line. Discussed with care management

## 2016-03-11 DIAGNOSIS — L89320 Pressure ulcer of left buttock, unstageable: Secondary | ICD-10-CM | POA: Diagnosis not present

## 2016-03-11 DIAGNOSIS — G629 Polyneuropathy, unspecified: Secondary | ICD-10-CM | POA: Diagnosis not present

## 2016-03-11 DIAGNOSIS — M797 Fibromyalgia: Secondary | ICD-10-CM | POA: Diagnosis not present

## 2016-03-11 DIAGNOSIS — I110 Hypertensive heart disease with heart failure: Secondary | ICD-10-CM | POA: Diagnosis not present

## 2016-03-11 DIAGNOSIS — N390 Urinary tract infection, site not specified: Secondary | ICD-10-CM | POA: Diagnosis not present

## 2016-03-11 DIAGNOSIS — I509 Heart failure, unspecified: Secondary | ICD-10-CM | POA: Diagnosis not present

## 2016-03-11 DIAGNOSIS — M069 Rheumatoid arthritis, unspecified: Secondary | ICD-10-CM | POA: Diagnosis not present

## 2016-03-11 DIAGNOSIS — D638 Anemia in other chronic diseases classified elsewhere: Secondary | ICD-10-CM | POA: Diagnosis not present

## 2016-03-11 DIAGNOSIS — J449 Chronic obstructive pulmonary disease, unspecified: Secondary | ICD-10-CM | POA: Diagnosis not present

## 2016-03-14 DIAGNOSIS — I5022 Chronic systolic (congestive) heart failure: Secondary | ICD-10-CM | POA: Diagnosis not present

## 2016-03-14 DIAGNOSIS — J449 Chronic obstructive pulmonary disease, unspecified: Secondary | ICD-10-CM | POA: Diagnosis not present

## 2016-03-14 DIAGNOSIS — M797 Fibromyalgia: Secondary | ICD-10-CM | POA: Diagnosis not present

## 2016-03-14 DIAGNOSIS — M069 Rheumatoid arthritis, unspecified: Secondary | ICD-10-CM | POA: Diagnosis not present

## 2016-03-14 DIAGNOSIS — G629 Polyneuropathy, unspecified: Secondary | ICD-10-CM | POA: Diagnosis not present

## 2016-03-14 DIAGNOSIS — I509 Heart failure, unspecified: Secondary | ICD-10-CM | POA: Diagnosis not present

## 2016-03-14 DIAGNOSIS — I110 Hypertensive heart disease with heart failure: Secondary | ICD-10-CM | POA: Diagnosis not present

## 2016-03-14 DIAGNOSIS — D638 Anemia in other chronic diseases classified elsewhere: Secondary | ICD-10-CM | POA: Diagnosis not present

## 2016-03-14 DIAGNOSIS — N390 Urinary tract infection, site not specified: Secondary | ICD-10-CM | POA: Diagnosis not present

## 2016-03-14 DIAGNOSIS — L89320 Pressure ulcer of left buttock, unstageable: Secondary | ICD-10-CM | POA: Diagnosis not present

## 2016-03-16 DIAGNOSIS — I5022 Chronic systolic (congestive) heart failure: Secondary | ICD-10-CM | POA: Diagnosis not present

## 2016-03-16 DIAGNOSIS — R0602 Shortness of breath: Secondary | ICD-10-CM | POA: Diagnosis not present

## 2016-03-17 DIAGNOSIS — B9620 Unspecified Escherichia coli [E. coli] as the cause of diseases classified elsewhere: Secondary | ICD-10-CM | POA: Diagnosis not present

## 2016-03-17 DIAGNOSIS — J449 Chronic obstructive pulmonary disease, unspecified: Secondary | ICD-10-CM | POA: Diagnosis not present

## 2016-03-17 DIAGNOSIS — Z09 Encounter for follow-up examination after completed treatment for conditions other than malignant neoplasm: Secondary | ICD-10-CM | POA: Diagnosis not present

## 2016-03-17 DIAGNOSIS — N390 Urinary tract infection, site not specified: Secondary | ICD-10-CM | POA: Diagnosis not present

## 2016-03-17 DIAGNOSIS — L659 Nonscarring hair loss, unspecified: Secondary | ICD-10-CM | POA: Diagnosis not present

## 2016-03-17 DIAGNOSIS — I5022 Chronic systolic (congestive) heart failure: Secondary | ICD-10-CM | POA: Diagnosis not present

## 2016-03-17 DIAGNOSIS — M150 Primary generalized (osteo)arthritis: Secondary | ICD-10-CM | POA: Diagnosis not present

## 2016-03-18 DIAGNOSIS — M797 Fibromyalgia: Secondary | ICD-10-CM | POA: Diagnosis not present

## 2016-03-18 DIAGNOSIS — I110 Hypertensive heart disease with heart failure: Secondary | ICD-10-CM | POA: Diagnosis not present

## 2016-03-18 DIAGNOSIS — I509 Heart failure, unspecified: Secondary | ICD-10-CM | POA: Diagnosis not present

## 2016-03-18 DIAGNOSIS — J449 Chronic obstructive pulmonary disease, unspecified: Secondary | ICD-10-CM | POA: Diagnosis not present

## 2016-03-18 DIAGNOSIS — M069 Rheumatoid arthritis, unspecified: Secondary | ICD-10-CM | POA: Diagnosis not present

## 2016-03-18 DIAGNOSIS — D638 Anemia in other chronic diseases classified elsewhere: Secondary | ICD-10-CM | POA: Diagnosis not present

## 2016-03-18 DIAGNOSIS — N390 Urinary tract infection, site not specified: Secondary | ICD-10-CM | POA: Diagnosis not present

## 2016-03-18 DIAGNOSIS — L89320 Pressure ulcer of left buttock, unstageable: Secondary | ICD-10-CM | POA: Diagnosis not present

## 2016-03-18 DIAGNOSIS — G629 Polyneuropathy, unspecified: Secondary | ICD-10-CM | POA: Diagnosis not present

## 2016-03-20 ENCOUNTER — Encounter: Payer: Medicare HMO | Attending: Surgery | Admitting: Surgery

## 2016-03-20 ENCOUNTER — Ambulatory Visit
Admission: RE | Admit: 2016-03-20 | Discharge: 2016-03-20 | Disposition: A | Discharge status: Home / Self Care | Payer: Medicare HMO | Source: Ambulatory Visit | Attending: Surgery | Admitting: Surgery

## 2016-03-20 ENCOUNTER — Other Ambulatory Visit: Payer: Self-pay | Admitting: Surgery

## 2016-03-20 DIAGNOSIS — G629 Polyneuropathy, unspecified: Secondary | ICD-10-CM | POA: Diagnosis not present

## 2016-03-20 DIAGNOSIS — Z853 Personal history of malignant neoplasm of breast: Secondary | ICD-10-CM | POA: Diagnosis not present

## 2016-03-20 DIAGNOSIS — F17218 Nicotine dependence, cigarettes, with other nicotine-induced disorders: Secondary | ICD-10-CM | POA: Diagnosis not present

## 2016-03-20 DIAGNOSIS — Z888 Allergy status to other drugs, medicaments and biological substances status: Secondary | ICD-10-CM | POA: Insufficient documentation

## 2016-03-20 DIAGNOSIS — M1990 Unspecified osteoarthritis, unspecified site: Secondary | ICD-10-CM | POA: Diagnosis not present

## 2016-03-20 DIAGNOSIS — Z91041 Radiographic dye allergy status: Secondary | ICD-10-CM | POA: Insufficient documentation

## 2016-03-20 DIAGNOSIS — B999 Unspecified infectious disease: Secondary | ICD-10-CM

## 2016-03-20 DIAGNOSIS — I429 Cardiomyopathy, unspecified: Secondary | ICD-10-CM | POA: Insufficient documentation

## 2016-03-20 DIAGNOSIS — Z923 Personal history of irradiation: Secondary | ICD-10-CM | POA: Insufficient documentation

## 2016-03-20 DIAGNOSIS — E785 Hyperlipidemia, unspecified: Secondary | ICD-10-CM | POA: Insufficient documentation

## 2016-03-20 DIAGNOSIS — Z9981 Dependence on supplemental oxygen: Secondary | ICD-10-CM | POA: Insufficient documentation

## 2016-03-20 DIAGNOSIS — E441 Mild protein-calorie malnutrition: Secondary | ICD-10-CM | POA: Diagnosis not present

## 2016-03-20 DIAGNOSIS — Z9221 Personal history of antineoplastic chemotherapy: Secondary | ICD-10-CM | POA: Insufficient documentation

## 2016-03-20 DIAGNOSIS — Z880 Allergy status to penicillin: Secondary | ICD-10-CM | POA: Insufficient documentation

## 2016-03-20 DIAGNOSIS — L89153 Pressure ulcer of sacral region, stage 3: Secondary | ICD-10-CM | POA: Diagnosis not present

## 2016-03-20 DIAGNOSIS — L89159 Pressure ulcer of sacral region, unspecified stage: Secondary | ICD-10-CM | POA: Diagnosis not present

## 2016-03-20 DIAGNOSIS — I5022 Chronic systolic (congestive) heart failure: Secondary | ICD-10-CM | POA: Insufficient documentation

## 2016-03-20 DIAGNOSIS — M797 Fibromyalgia: Secondary | ICD-10-CM | POA: Insufficient documentation

## 2016-03-20 DIAGNOSIS — I700 Atherosclerosis of aorta: Secondary | ICD-10-CM | POA: Diagnosis not present

## 2016-03-20 DIAGNOSIS — L89154 Pressure ulcer of sacral region, stage 4: Secondary | ICD-10-CM | POA: Diagnosis not present

## 2016-03-20 DIAGNOSIS — J449 Chronic obstructive pulmonary disease, unspecified: Secondary | ICD-10-CM | POA: Insufficient documentation

## 2016-03-21 DIAGNOSIS — I110 Hypertensive heart disease with heart failure: Secondary | ICD-10-CM | POA: Diagnosis not present

## 2016-03-21 DIAGNOSIS — G629 Polyneuropathy, unspecified: Secondary | ICD-10-CM | POA: Diagnosis not present

## 2016-03-21 DIAGNOSIS — L89320 Pressure ulcer of left buttock, unstageable: Secondary | ICD-10-CM | POA: Diagnosis not present

## 2016-03-21 DIAGNOSIS — J449 Chronic obstructive pulmonary disease, unspecified: Secondary | ICD-10-CM | POA: Diagnosis not present

## 2016-03-21 DIAGNOSIS — N390 Urinary tract infection, site not specified: Secondary | ICD-10-CM | POA: Diagnosis not present

## 2016-03-21 DIAGNOSIS — I509 Heart failure, unspecified: Secondary | ICD-10-CM | POA: Diagnosis not present

## 2016-03-21 DIAGNOSIS — D638 Anemia in other chronic diseases classified elsewhere: Secondary | ICD-10-CM | POA: Diagnosis not present

## 2016-03-21 DIAGNOSIS — M797 Fibromyalgia: Secondary | ICD-10-CM | POA: Diagnosis not present

## 2016-03-21 DIAGNOSIS — M069 Rheumatoid arthritis, unspecified: Secondary | ICD-10-CM | POA: Diagnosis not present

## 2016-03-21 NOTE — Progress Notes (Signed)
[name] [name] ([id number]) Visit Report for 03/20/2016 Abuse/Suicide Risk Screen Details Patient Name: [name] [name], [name] [name]. Date of Service: 03/20/2016 9:45 AM Medical Record Number: [id number] Patient Account Number: [id number] Date of Birth/Sex: 02/14/1940 (76 y.o. Female) Treating RN: Carolyne Fiscal, Debi Primary Care Beata Beason: Tracie Harrier Other Clinician: Referring Ahlivia Salahuddin: Tracie Harrier Treating Senovia Gauer/Extender: Frann Rider in Treatment: 0 Abuse/Suicide Risk Screen Items Answer ABUSE/SUICIDE RISK SCREEN: Has anyone close to you tried to [name] or harm you recentlyo No Do you feel uncomfortable with anyone in your familyo No Has anyone forced you do things that you didnot want to doo No Do you have any thoughts of harming yourselfo No Patient displays signs or symptoms of abuse and/or neglect. No Electronic Signature(s) Signed: 03/20/2016 4:26:43 PM By: Alric Quan Entered By: Alric Quan on 03/20/2016 09:57:27 [name] [name] ([id number]) -------------------------------------------------------------------------------- Activities of Daily Living Details Patient Name: [name], [name] Date of Service: 03/20/2016 9:45 AM Medical Record Number: [id number] Patient Account Number: [id number] Date of Birth/Sex: Sep 10, 1940 (76 y.o. Female) Treating RN: Ahmed Prima Primary Care Patric Buckhalter: Tracie Harrier Other Clinician: Referring Lux Skilton: Tracie Harrier Treating Kalieb Freeland/Extender: Frann Rider in Treatment: 0 Activities of Daily Living Items Answer Activities of Daily Living (Please select one for each item) Drive Automobile Not Able Take Medications Completely Able Use Telephone Completely Able Care for Appearance Completely Able Use Toilet Completely Able Bath / Shower Completely Able Dress Self Not Able Feed Self Completely Able Walk Completely Able Get In / Out Bed Completely Able Housework Need Assistance Prepare Meals Need  Assistance Handle Money Completely Able Shop for Self Need Assistance Electronic Signature(s) Signed: 03/20/2016 4:26:43 PM By: Alric Quan Entered By: Alric Quan on 03/20/2016 09:58:03 [name] [name] ([id number]) -------------------------------------------------------------------------------- Education Assessment Details Patient Name: [name], [name] Date of Service: 03/20/2016 9:45 AM Medical Record Number: [id number] Patient Account Number: [id number] Date of Birth/Sex: [birth year] (76 y.o. Female) Treating RN: Carolyne Fiscal, Debi Primary Care Lorna Strother: Tracie Harrier Other Clinician: Referring Porscha Axley: Tracie Harrier Treating Johnpaul Gillentine/Extender: Frann Rider in Treatment: 0 Primary Learner Assessed: Patient Learning Preferences/Education Level/Primary Language Learning Preference: Explanation, Printed Material Highest Education Level: College or Above Preferred Language: English Cognitive Barrier Assessment/Beliefs Language Barrier: No Translator Needed: No Memory Deficit: No Emotional Barrier: No Cultural/Religious Beliefs Affecting Medical No Care: Physical Barrier Assessment Impaired Vision: No Impaired Hearing: No Decreased Hand dexterity: No Knowledge/Comprehension Assessment Knowledge Level: Medium Comprehension Level: Medium Ability to understand written Medium instructions: Ability to understand verbal Medium instructions: Motivation Assessment Anxiety Level: Calm Cooperation: Cooperative Education Importance: Acknowledges Need Interest in Health Problems: Asks Questions Perception: Coherent Willingness to Engage in Self- Medium Management Activities: Readiness to Engage in Self- Medium Management Activities: Electronic Signature(s) [name] [name] ([id number]) Signed: 03/20/2016 4:26:43 PM By: Alric Quan Entered By: Alric Quan on 03/20/2016 09:58:39 [name] [name]  ([id number]) -------------------------------------------------------------------------------- Fall Risk Assessment Details Patient Name: Hook, [name] Date of Service: 03/20/2016 9:45 AM Medical Record Number: [id number] Patient Account Number: [id number] Date of Birth/Sex: 12-27-1940 (76 y.o. Female) Treating RN: Carolyne Fiscal, Debi Primary Care Ari Engelbrecht: Tracie Harrier Other Clinician: Referring Aizlynn Digilio: Tracie Harrier Treating Melisssa Donner/Extender: Frann Rider in Treatment: 0 Fall Risk Assessment Items Have you had 2 or more falls in the last 12 monthso 0 Yes Have you had any fall that resulted in injury in the last 12 monthso 0 No FALL RISK ASSESSMENT: History of falling - immediate or within 3 months 0 No Secondary diagnosis 15 Yes Ambulatory aid None/bed rest/wheelchair/nurse 0 No Crutches/cane/walker 0 No  Furniture 0 No IV Access/Saline Lock 0 No Gait/Training Normal/bed rest/immobile 0 No Weak 10 Yes Impaired 0 No Mental Status Oriented to own ability 0 Yes Electronic Signature(s) Signed: 03/20/2016 4:26:43 PM By: Alric Quan Entered By: Alric Quan on 03/20/2016 09:59:47 [name] [name] ([id number]) -------------------------------------------------------------------------------- Foot Assessment Details Patient Name: [name], [name] Date of Service: 03/20/2016 9:45 AM Medical Record Number: [id number] Patient Account Number: [id number] Date of Birth/Sex: 1940-10-12 (76 y.o. Female) Treating RN: Carolyne Fiscal, Debi Primary Care Dajon Lazar: Tracie Harrier Other Clinician: Referring Rochester Serpe: Tracie Harrier Treating Skyelar Swigart/Extender: Frann Rider in Treatment: 0 Foot Assessment Items Site Locations + = Sensation present, - = Sensation absent, C = Callus, U = Ulcer R = Redness, W = Warmth, M = Maceration, PU = Pre-ulcerative lesion F = Fissure, S = Swelling, D = Dryness Assessment Right: Left: Other Deformity: No No Prior Foot Ulcer: No  No Prior Amputation: No No Charcot Joint: No No Ambulatory Status: Gait: Electronic Signature(s) Signed: 03/20/2016 4:26:43 PM By: Alric Quan Entered By: Alric Quan on 03/20/2016 10:00:02 [name] [name], Carmela [name] ([id number]) -------------------------------------------------------------------------------- Nutrition Risk Assessment Details Patient Name: Pires, Yitty G. Date of Service: 03/20/2016 9:45 AM Medical Record Number: [id number] Patient Account Number: [id number] Date of Birth/Sex: [birth year] (76 y.o. Female) Treating RN: Carolyne Fiscal, Debi Primary Care Edan Serratore: Tracie Harrier Other Clinician: Referring Colleena Kurtenbach: Tracie Harrier Treating Loy Little/Extender: Frann Rider in Treatment: 0 Height (in): 62 Weight (lbs): 107 Body Mass Index (BMI): 19.6 Nutrition Risk Assessment Items NUTRITION RISK SCREEN: I have an illness or condition that made me change the kind and/or 0 No amount of food I eat I eat fewer than two meals per day 0 No I eat few fruits and vegetables, or milk products 0 No I have three or more drinks of beer, liquor or wine almost every day 0 No I have tooth or mouth problems that make it hard for me to eat 0 No I don't always have enough money to buy the food I need 0 No I eat alone most of the time 0 No I take three or more different prescribed or over-the-counter drugs a 1 Yes day Without wanting to, I have lost or gained 10 pounds in the last six 0 No months I am not always physically able to shop, cook and/or feed myself 0 No Nutrition Protocols Good Risk Protocol Moderate Risk Protocol Electronic Signature(s) Signed: 03/20/2016 4:26:43 PM By: Alric Quan Entered By: Alric Quan on 03/20/2016 09:59:55

## 2016-03-21 NOTE — Progress Notes (Addendum)
Fiorini, [name] [name] ([id number]) Visit Report for 03/20/2016 Chief Complaint Document Details Patient Name: [name] [name], [name] [name]. Date of Service: 03/20/2016 9:45 AM Medical Record Number: [id number] Patient Account Number: [id number] Date of Birth/Sex: 1940-06-08 (76 y.o. Female) Treating RN: Ahmed Prima Primary Care Provider: Tracie Harrier Other Clinician: Referring Provider: Tracie Harrier Treating Provider/Extender: Frann Rider in Treatment: 0 Information Obtained from: Patient Chief Complaint Patient is at the clinic for treatment of an open pressure ulcer to the sacral area which she's had for at least a month Electronic Signature(s) Signed: 03/20/2016 11:09:24 AM By: Christin Fudge MD, FACS Entered By: Christin Fudge on 03/20/2016 11:09:24 Deguzman, [name] ([id number]) -------------------------------------------------------------------------------- HPI Details Patient Name: [name], [name] G. Date of Service: 03/20/2016 9:45 AM Medical Record Number: [id number] Patient Account Number: [id number] Date of Birth/Sex: 1940/06/11 (76 y.o. Female) Treating RN: Carolyne Fiscal, Debi Primary Care Provider: Tracie Harrier Other Clinician: Referring Provider: Tracie Harrier Treating Provider/Extender: Frann Rider in Treatment: 0 History of Present Illness Location: sacral pressure ulcer Quality: Patient reports experiencing a dull pain to affected area(s). Severity: Patient states wound are getting worse. Duration: Patient has had the wound for < 5 weeks prior to presenting for treatment Timing: Pain in wound is constant (hurts all the time) Context: The wound appeared gradually over time Modifying Factors: Other treatment(s) tried include:local care by home health Associated Signs and Symptoms: Patient reports having increase discharge. HPI Description: 76 year old patient who has been sent to [place] by Dr. Ginette Pitman, has a past medical history of COPD, systolic CHF, fibromyalgia,  osteoarthritis, recurrent UTIs and a sacral pressure ulcer which has secretions and pain. Status post cystectomy in February 2017, cervical cancer with hysterectomy in 1990 and a left partial mastectomy in 2009. She is a smoker and smokes 8 cigarettes every day. Electronic Signature(s) Signed: 03/20/2016 11:11:42 AM By: Christin Fudge MD, FACS Previous Signature: 03/20/2016 10:12:02 AM Version By: Christin Fudge MD, FACS Entered By: Christin Fudge on 03/20/2016 11:11:41 [place], [name] ([id number]) -------------------------------------------------------------------------------- Physical Exam Details Patient Name: [name], [name] G. Date of Service: 03/20/2016 9:45 AM Medical Record Number: [id number] Patient Account Number: [id number] Date of Birth/Sex: [birth year] (76 y.o. Female) Treating RN: Ahmed Prima Primary Care Provider: Tracie Harrier Other Clinician: Referring Provider: Tracie Harrier Treating Provider/Extender: Frann Rider in Treatment: 0 Constitutional . Pulse regular. Respirations normal and unlabored. Afebrile. . Eyes Nonicteric. Reactive to light. Ears, Nose, Mouth, and Throat Lips, teeth, and gums WNL.[name] Moist mucosa without lesions. Neck supple and nontender. No palpable supraclavicular or cervical adenopathy. Normal sized without goiter. Respiratory WNL. No retractions.. Breath sounds WNL, No rubs, rales, rhonchi, or wheeze.. Cardiovascular Heart rhythm and rate regular, no murmur or gallop.. Pedal Pulses WNL. No clubbing, cyanosis or edema. Chest Breasts symmetical and no nipple discharge.. Breast tissue WNL, no masses, lumps, or tenderness.. Gastrointestinal (GI) Abdomen without masses or tenderness.. No liver or spleen enlargement or tenderness.. Lymphatic No adneopathy. No adenopathy. No adenopathy. Musculoskeletal Adexa without tenderness or enlargement.. Digits and nails w/o clubbing, cyanosis, infection, petechiae, ischemia, or inflammatory  conditions.. Integumentary (Hair, Skin) No suspicious lesions. No crepitus or fluctuance. No peri-wound warmth or erythema. No masses.[name] Psychiatric Judgement and insight Intact.. No evidence of depression, anxiety, or agitation.. Notes the opening of the sacral decubitus ulcer is fairly small but she has a lot of undermining especially towards the 12:00 position which is 3 cm in about the 1:00 position which is about 4 cm. The lower half of the wound has minimal undermining. She has got  some serosanguinous drainage from this area. Electronic Signature(s) Signed: 03/20/2016 11:12:38 AM By: Christin Fudge MD, FACS Entered By: Christin Fudge on 03/20/2016 11:12:36 [place], [name] ([id number]) [place], [name] ([id number]) -------------------------------------------------------------------------------- Physician Orders Details Patient Name: [name], [name] G. Date of Service: 03/20/2016 9:45 AM Medical Record Number: [id number] Patient Account Number: [id number] Date of Birth/Sex: 04/13/1940 (76 y.o. Female) Treating RN: Carolyne Fiscal, Debi Primary Care Provider: Tracie Harrier Other Clinician: Referring Provider: Tracie Harrier Treating Provider/Extender: Frann Rider in Treatment: 0 Verbal / Phone Orders: Yes Clinician: Pinkerton, Debi Read Back and Verified: Yes Diagnosis Coding Wound Cleansing Wound #1 Midline Sacrum o Clean wound with Normal Saline. Anesthetic Wound #1 Midline Sacrum o Topical Lidocaine 4% cream applied to wound bed prior to debridement - for clinic use Skin Barriers/Peri-Wound Care Wound #1 Midline Sacrum o Skin Prep Primary Wound Dressing Wound #1 Midline Sacrum o Aquacel Ag - or equivalent to this is to be used until [facility] gets wound vac Secondary Dressing Wound #1 Midline Sacrum o Boardered Foam Dressing Follow-up Appointments Wound #1 Midline Sacrum o Return Appointment in 1 week. Off-Loading Wound #1 Midline Sacrum o Turn and  reposition every 2 hours Additional Orders / Instructions Wound #1 Midline Sacrum o Stop Smoking o Increase protein intake. Kerstein, ARIANNE KLINGE ([id number]) Home Health Wound #1 Midline [place] Visits - [place] Nurse may visit PRN to address patientos wound care needs. o FACE TO FACE ENCOUNTER: MEDICARE and MEDICAID PATIENTS: I certify that this patient is under my care and that I had a face-to-face encounter that meets the physician face-to-face encounter requirements with this patient on this date. The encounter with the patient was in whole or in part for the following MEDICAL CONDITION: (primary reason for [place]) MEDICAL NECESSITY: I certify, that based on my findings, NURSING services are a medically necessary home health service. HOME BOUND STATUS: I certify that my clinical findings support that this patient is homebound (i.e., Due to illness or injury, pt requires aid of supportive devices such as crutches, cane, wheelchairs, walkers, the use of special transportation or the assistance of another person to leave their place of residence. There is a normal inability to leave the home and doing so requires considerable and taxing effort. Other absences are for medical reasons / religious services and are infrequent or of short duration when for other reasons). o If current dressing causes regression in wound condition, may D/C ordered dressing product/s and apply Normal Saline Moist Dressing daily until next [place] / Other MD appointment. [place] of regression in wound condition at [phone]. o Please direct any NON-WOUND related issues/requests for orders to patient's Primary Care Physician Negative Pressure Wound Therapy Wound #1 Midline Sacrum o Wound VAC settings at 125/130 mmHg continuous pressure. Use BLACK/GREEN foam to wound cavity. Use WHITE foam to fill any tunnel/s and/or  undermining. Change VAC dressing 3 X WEEK. Change canister as indicated when full. Nurse may titrate settings and frequency of dressing changes as clinically indicated. - HHRN to order and place on pt gauze to the undermining the deepest point is 1 o'clock at 4cm o Number of foam/gauze pieces used in the dressing = Radiology o X-ray, other - pelvis with attention to sacral bone Electronic Signature(s) Signed: 03/21/2016 3:56:49 PM By: Christin Fudge MD, FACS Signed: 03/21/2016 5:07:14 PM By: Alric Quan Previous Signature: 03/20/2016 4:19:30 PM Version By: Christin Fudge MD, FACS Previous Signature: 03/20/2016 4:26:43 PM Version By:  Alric Quan Entered By: Alric Quan on 03/21/2016 10:53:32 Martell, [name] ([id number]) -------------------------------------------------------------------------------- Problem List Details Patient Name: Barnaby, Keyaira G. Date of Service: 03/20/2016 9:45 AM Medical Record Number: [id number] Patient Account Number: [id number] Date of Birth/Sex: 07/17/1940 (76 y.o. Female) Treating RN: Ahmed Prima Primary Care Provider: Tracie Harrier Other Clinician: Referring Provider: Tracie Harrier Treating Provider/Extender: Frann Rider in Treatment: 0 Active Problems ICD-10 Encounter Code Description Active Date Diagnosis L89.153 Pressure ulcer of sacral region, stage 3 03/20/2016 Yes F17.218 Nicotine dependence, cigarettes, with other nicotine- 03/20/2016 Yes induced disorders E44.1 Mild protein-calorie malnutrition 03/20/2016 Yes H84.69 Chronic systolic (congestive) heart failure 03/20/2016 Yes Inactive Problems Resolved Problems Electronic Signature(s) Signed: 03/20/2016 11:08:57 AM By: Christin Fudge MD, FACS Entered By: Christin Fudge on 03/20/2016 11:08:57 [place], [name] ([id number]) -------------------------------------------------------------------------------- Progress Note Details Patient Name: Maffett, Wyndi G. Date of Service:  03/20/2016 9:45 AM Medical Record Number: [id number] Patient Account Number: [id number] Date of Birth/Sex: 06-Oct-1940 (76 y.o. Female) Treating RN: Carolyne Fiscal, Debi Primary Care Provider: Tracie Harrier Other Clinician: Referring Provider: Tracie Harrier Treating Provider/Extender: Frann Rider in Treatment: 0 Subjective Chief Complaint Information obtained from Patient Patient is at the clinic for treatment of an open pressure ulcer to the sacral area which she's had for at least a month History of Present Illness (HPI) The following HPI elements were documented for the patient's wound: Location: sacral pressure ulcer Quality: Patient reports experiencing a dull pain to affected area(s). Severity: Patient states wound are getting worse. Duration: Patient has had the wound for < 5 weeks prior to presenting for treatment Timing: Pain in wound is constant (hurts all the time) Context: The wound appeared gradually over time Modifying Factors: Other treatment(s) tried include:local care by home health Associated Signs and Symptoms: Patient reports having increase discharge. 76 year old patient who has been sent to [place] by Dr. Ginette Pitman, has a past medical history of COPD, systolic CHF, fibromyalgia, osteoarthritis, recurrent UTIs and a sacral pressure ulcer which has secretions and pain. Status post cystectomy in February 2017, cervical cancer with hysterectomy in 1990 and a left partial mastectomy in 2009. She is a smoker and smokes 8 cigarettes every day. Wound History Patient presents with 1 open wound that has been present for approximately 2 months. Patient has been treating wound in the following manner: medihoney. Laboratory tests have not been performed in the last month. Patient reportedly has not tested positive for an antibiotic resistant organism. Patient reportedly has not tested positive for osteomyelitis. Patient reportedly has had testing performed to evaluate  circulation in the legs. Patient experiences the following problems associated with their wounds: swelling. Patient History Information obtained from Patient, Caregiver. Allergies adhesive, ioxaglate sodium, petrolatum,white, sulfur, tetracycline, Augmentin, Iodinated Contrast- Oral and IV Dye Family History Bohman, EMILEY DIGIACOMO ([id number]) Cancer - Father, Mother, Diabetes - Father, Heart Disease - Father, Hypertension - Father, Kidney Disease - Siblings, Stroke - Father, No family history of Hereditary Spherocytosis, Lung Disease, Seizures, Thyroid Problems, Tuberculosis. Social History Current some day smoker - 6-7 a day, Marital Status - Divorced, Alcohol Use - Never, Drug Use - No History, Caffeine Use - Daily. Medical History Respiratory Patient has history of Asthma, Chronic Obstructive Pulmonary Disease (COPD) Cardiovascular Patient has history of Congestive Heart Failure Musculoskeletal Patient has history of Osteoarthritis Neurologic Patient has history of Neuropathy Oncologic Patient has history of Received Chemotherapy - 2000, Received Radiation - 2000 Review of Systems (ROS) Constitutional Symptoms (General Health) The patient has no complaints or symptoms. Eyes The patient  has no complaints or symptoms. Ear/Nose/Mouth/Throat The patient has no complaints or symptoms. Hematologic/Lymphatic 5 aneurysms Respiratory on O2 at home Cardiovascular hyperlipidemia hx cardiomyopathy Gastrointestinal The patient has no complaints or symptoms. Endocrine The patient has no complaints or symptoms. Genitourinary recurrent cystitis Immunological The patient has no complaints or symptoms. Integumentary (Skin) Complains or has symptoms of Wounds. Musculoskeletal fibromyalgia osteoporosis Oncologic breast cancer with lumpectomy Psychiatric The patient has no complaints or symptoms. Tilmon, LAVADA LANGSAM ([id number]) Medications Calcium 600 + D(3) 600 mg (1,500 mg)-200 unit  tablet oral 1 1 tablet oral daily Objective Constitutional Pulse regular. Respirations normal and unlabored. Afebrile. Vitals Time Taken: 9:44 AM, Height: 62 in, Source: Stated, Weight: 107 lbs, Source: Measured, BMI: 19.6, Temperature: 97.9 F, Pulse: 93 bpm, Respiratory Rate: 18 breaths/min, Blood Pressure: 134/65 mmHg. Eyes Nonicteric. Reactive to light. Ears, Nose, Mouth, and Throat Lips, teeth, and gums WNL.[name] Moist mucosa without lesions. Neck supple and nontender. No palpable supraclavicular or cervical adenopathy. Normal sized without goiter. Respiratory WNL. No retractions.. Breath sounds WNL, No rubs, rales, rhonchi, or wheeze.. Cardiovascular Heart rhythm and rate regular, no murmur or gallop.. Pedal Pulses WNL. No clubbing, cyanosis or edema. Chest Breasts symmetical and no nipple discharge.. Breast tissue WNL, no masses, lumps, or tenderness.. Gastrointestinal (GI) Abdomen without masses or tenderness.. No liver or spleen enlargement or tenderness.. Lymphatic No adneopathy. No adenopathy. No adenopathy. Musculoskeletal Adexa without tenderness or enlargement.. Digits and nails w/o clubbing, cyanosis, infection, petechiae, ischemia, or inflammatory conditions.[name] Psychiatric Judgement and insight Intact.. No evidence of depression, anxiety, or agitation.[name] Caley, PAISLY FINGERHUT ([id number]) General Notes: the opening of the sacral decubitus ulcer is fairly small but she has a lot of undermining especially towards the 12:00 position which is 3 cm in about the 1:00 position which is about 4 cm. The lower half of the wound has minimal undermining. She has got some serosanguinous drainage from this area. Integumentary (Hair, Skin) No suspicious lesions. No crepitus or fluctuance. No peri-wound warmth or erythema. No masses.. Wound #1 status is Open. Original cause of wound was Pressure Injury. The wound is located on the Midline Sacrum. The wound measures 1.1cm length x 0.8cm width  x 1.2cm depth; 0.691cm^2 area and 0.829cm^3 volume. There is muscle, Fat Layer (Subcutaneous Tissue) Exposed, and fascia exposed. There is no tunneling noted, however, there is undermining starting at 12:00 and ending at 12:00 with a maximum distance of 4cm. There is a large amount of serous drainage noted. The wound margin is distinct with the outline attached to the wound base. There is medium (34-66%) red granulation within the wound bed. There is a medium (34-66%) amount of necrotic tissue within the wound bed including Adherent Slough. Periwound temperature was noted as No Abnormality. The periwound has tenderness on palpation. Assessment Active Problems ICD-10 L89.153 - Pressure ulcer of sacral region, stage 3 F17.218 - Nicotine dependence, cigarettes, with other nicotine-induced disorders E44.1 - Mild protein-calorie malnutrition N82.95 - Chronic systolic (congestive) heart failure 76 year old patient who has many comorbidities including recurrent UTIs and COPD comes with a sacral decubitus ulcer which has been there for about 5 weeks. After review I have recommended: 1. Wound VAC application to be changed 3 times a week 2. packed the wound with silver alginate for today 3. X-ray of the sacral area to be done 4. I have spent over 3 minutes discussing with her the need to completely give up smoking and have discussed the risks benefits alternatives and the importance of quitting smoking 5.  adequate protein, vitamin A, vitamin C and zinc all questions answered and she says she will be compliant Plan Peltz, Tearah G. ([id number]) Wound Cleansing: Wound #1 Midline Sacrum: Clean wound with Normal Saline. Anesthetic: Wound #1 Midline Sacrum: Topical Lidocaine 4% cream applied to wound bed prior to debridement - for clinic use Skin Barriers/Peri-Wound Care: Wound #1 Midline Sacrum: Skin Prep Primary Wound Dressing: Wound #1 Midline Sacrum: Aquacel Ag - or equivalent to this is to  be used until [facility] gets wound vac Secondary Dressing: Wound #1 Midline Sacrum: Boardered Foam Dressing Follow-up Appointments: Wound #1 Midline Sacrum: Return Appointment in 1 week. Off-Loading: Wound #1 Midline Sacrum: Turn and reposition every 2 hours Additional Orders / Instructions: Wound #1 Midline Sacrum: Stop Smoking Increase protein intake. Home Health: Wound #1 Midline Sacrum: Continue Home Health Visits - [place] Nurse may visit PRN to address patient s wound care needs. FACE TO FACE ENCOUNTER: MEDICARE and MEDICAID PATIENTS: I certify that this patient is under my care and that I had a face-to-face encounter that meets the physician face-to-face encounter requirements with this patient on this date. The encounter with the patient was in whole or in part for the following MEDICAL CONDITION: (primary reason for [place]) MEDICAL NECESSITY: I certify, that based on my findings, NURSING services are a medically necessary home health service. HOME BOUND STATUS: I certify that my clinical findings support that this patient is homebound (i.e., Due to illness or injury, pt requires aid of supportive devices such as crutches, cane, wheelchairs, walkers, the use of special transportation or the assistance of another person to leave their place of residence. There is a normal inability to leave the home and doing so requires considerable and taxing effort. Other absences are for medical reasons / religious services and are infrequent or of short duration when for other reasons). If current dressing causes regression in wound condition, may D/C ordered dressing product/s and apply Normal Saline Moist Dressing daily until next [place] / Other MD appointment. [place] of regression in wound condition at [phone]. Please direct any NON-WOUND related issues/requests for orders to patient's Primary Care Physician Negative Pressure Wound  Therapy: Wound #1 Midline Sacrum: Wound VAC settings at 125/130 mmHg continuous pressure. Use BLACK/GREEN foam to wound cavity. Use WHITE foam to fill any tunnel/s and/or undermining. Change VAC dressing 3 X WEEK. Change canister as indicated when full. Nurse may titrate settings and frequency of dressing changes as clinically indicated. - HHRN to order and place on pt gauze to the undermining the deepest point is 1 o'clock at 4cm Number of foam/gauze pieces used in the dressing = Schrupp, IYANIA DENNE ([id number]) Radiology ordered were: X-ray, other - pelvis with attention to sacral bone 76 year old patient who has many comorbidities including recurrent UTIs and COPD comes with a sacral decubitus ulcer which has been there for about 5 weeks. After review I have recommended: 1. Wound VAC application to be changed 3 times a week 2. packed the wound with silver alginate for today 3. X-ray of the sacral area to be done 4. I have spent over 3 minutes discussing with her the need to completely give up smoking and have discussed the risks benefits alternatives and the importance of quitting smoking 5. adequate protein, vitamin A, vitamin C and zinc all questions answered and she says she will be compliant Electronic Signature(s) Signed: 03/21/2016 3:57:54 PM By: Christin Fudge MD, FACS Previous Signature: 03/20/2016 11:15:19 AM Version By: Con Memos  Roderick Pee MD, FACS Entered By: Christin Fudge on 03/21/2016 15:57:54 Cosper, [name] ([id number]) -------------------------------------------------------------------------------- ROS/PFSH Details Patient Name: Heide, Pragya G. Date of Service: 03/20/2016 9:45 AM Medical Record Number: [id number] Patient Account Number: [id number] Date of Birth/Sex: 1940-12-04 (76 y.o. Female) Treating RN: Carolyne Fiscal, Debi Primary Care Provider: Tracie Harrier Other Clinician: Referring Provider: Tracie Harrier Treating Provider/Extender: Frann Rider in Treatment:  0 Information Obtained From Patient Caregiver Wound History Do you currently have one or more open woundso Yes How many open wounds do you currently haveo 1 Approximately how long have you had your woundso 2 months How have you been treating your wound(s) until nowo medihoney Has your wound(s) ever healed and then re-openedo No Have you had any lab work done in the past montho No Have you tested positive for an antibiotic resistant organism (MRSA, VRE)o No Have you tested positive for osteomyelitis (bone infection)o No Have you had any tests for circulation on your legso Yes Who ordered the testo dr dew [facility] the test [facility]o [facility] Have you had other problems associated with your woundso Swelling Integumentary (Skin) Complaints and Symptoms: Positive for: Wounds Constitutional Symptoms (General Health) Complaints and Symptoms: No Complaints or Symptoms Eyes Complaints and Symptoms: No Complaints or Symptoms Ear/Nose/Mouth/Throat Complaints and Symptoms: No Complaints or Symptoms Hematologic/Lymphatic Complaints and Symptoms: Review of System Notes: 5 aneurysms Jaskolski, Kourtlynn G. ([id number]) Respiratory Complaints and Symptoms: Review of System Notes: on O2 at home Medical History: Positive for: Asthma; Chronic Obstructive Pulmonary Disease (COPD) Cardiovascular Complaints and Symptoms: Review of System Notes: hyperlipidemia hx cardiomyopathy Medical History: Positive for: Congestive Heart Failure Gastrointestinal Complaints and Symptoms: No Complaints or Symptoms Endocrine Complaints and Symptoms: No Complaints or Symptoms Genitourinary Complaints and Symptoms: Review of System Notes: recurrent cystitis Immunological Complaints and Symptoms: No Complaints or Symptoms Musculoskeletal Complaints and Symptoms: Review of System Notes: fibromyalgia osteoporosis Medical History: Positive for: Osteoarthritis Neurologic Bour, ADRIEN DIETZMAN ([id number]) Medical  History: Positive for: Neuropathy Oncologic Complaints and Symptoms: Review of System Notes: breast cancer with lumpectomy Medical History: Positive for: Received Chemotherapy - 2000; Received Radiation - 2000 Psychiatric Complaints and Symptoms: No Complaints or Symptoms Immunizations Pneumococcal Vaccine: Received Pneumococcal Vaccination: Yes Family and Social History Cancer: Yes - Father, Mother; Diabetes: Yes - Father; Heart Disease: Yes - Father; Hereditary Spherocytosis: No; Hypertension: Yes - Father; Kidney Disease: Yes - Siblings; Lung Disease: No; Seizures: No; Stroke: Yes - Father; Thyroid Problems: No; Tuberculosis: No; Current some day smoker - 6-7 a day; Marital Status - Divorced; Alcohol Use: Never; Drug Use: No History; Caffeine Use: Daily; Financial Concerns: No; Food, Clothing or Shelter Needs: No; Support System Lacking: No; Transportation Concerns: No; Advanced Directives: No; Patient does not want information on Advanced Directives; Do not resuscitate: No; Living Will: Yes (Not Provided); Medical Power of Attorney: No Physician Affirmation I have reviewed and agree with the above information. Electronic Signature(s) Signed: 03/20/2016 4:19:30 PM By: Christin Fudge MD, FACS Signed: 03/20/2016 4:26:43 PM By: Alric Quan Entered By: Christin Fudge on 03/20/2016 10:04:50 Gavina, [name] ([id number]) -------------------------------------------------------------------------------- SuperBill Details Patient Name: Goette, Goldy G. Date of Service: 03/20/2016 Medical Record Number: [id number] Patient Account Number: [id number] Date of Birth/Sex: 08-Aug-1940 (76 y.o. Female) Treating RN: Carolyne Fiscal, Debi Primary Care Provider: Tracie Harrier Other Clinician: Referring Provider: Tracie Harrier Treating Provider/Extender: Christin Fudge Service Line: Outpatient Weeks in Treatment: 0 Diagnosis Coding ICD-10 Codes Code Description L89.153 Pressure ulcer of sacral  region, stage 3 F17.218 Nicotine dependence, cigarettes, with other nicotine-induced disorders E44.1  Mild protein-calorie malnutrition C78.93 Chronic systolic (congestive) heart failure Facility Procedures CPT4 Code: 81017510 Description: 99214 - WOUND CARE VISIT-LEV 4 EST PT Modifier: Quantity: 1 Physician Procedures CPT4 Code Description: 2585277 82423 - WC PHYS LEVEL 4 - NEW PT ICD-10 Description Diagnosis L89.153 Pressure ulcer of sacral region, stage 3 F17.218 Nicotine dependence, cigarettes, with other nicotine E44.1 Mild protein-calorie malnutrition N36.14  Chronic systolic (congestive) heart failure Modifier: -induced disor Quantity: 1 ders Electronic Signature(s) Signed: 03/20/2016 4:19:30 PM By: Christin Fudge MD, FACS Signed: 03/20/2016 4:26:43 PM By: Alric Quan Previous Signature: 03/20/2016 11:15:33 AM Version By: Christin Fudge MD, FACS Entered By: Alric Quan on 03/20/2016 13:12:46

## 2016-03-21 NOTE — Progress Notes (Signed)
[name], [name] ([id number]) Visit Report for 03/20/2016 Allergy List Details Patient Name: [name] [name], [name] [name]. Date of Service: 03/20/2016 9:45 AM Medical Record Number: [id number] Patient Account Number: [id number] Date of Birth/Sex: 1940-01-29 (76 y.o. Female) Treating RN: Ahmed Prima Primary Care Linea Calles: Tracie Harrier Other Clinician: Referring Abdirizak Richison: Tracie Harrier Treating Remiel Corti/Extender: Frann Rider in Treatment: 0 Allergies Active Allergies adhesive ioxaglate sodium petrolatum,white sulfur tetracycline Augmentin Iodinated Contrast- Oral and IV Dye Allergy Notes Electronic Signature(s) Signed: 03/20/2016 4:26:43 PM By: Alric Quan Entered By: Alric Quan on 03/20/2016 09:47:58 Rotenberg, [name] [name] ([id number]) -------------------------------------------------------------------------------- Arrival Information Details Patient Name: [name] [name], [name] G. Date of Service: 03/20/2016 9:45 AM Medical Record Number: [id number] Patient Account Number: [id number] Date of Birth/Sex: April 23, 1940 (76 y.o. Female) Treating RN: Carolyne Fiscal, Debi Primary Care Adriella Essex: Tracie Harrier Other Clinician: Referring Aryani Daffern: Tracie Harrier Treating Sherrice Creekmore/Extender: Frann Rider in Treatment: 0 Visit Information Patient Arrived: Ambulatory Arrival Time: 09:42 Accompanied By: self Transfer Assistance: None Patient Identification Verified: Yes Secondary Verification Process Yes Completed: Patient Requires Transmission-Based No Precautions: Patient Has Alerts: No Electronic Signature(s) Signed: 03/20/2016 4:26:43 PM By: Alric Quan Entered By: Alric Quan on 03/20/2016 09:44:00 [place], [name] [name] ([id number]) -------------------------------------------------------------------------------- Clinic Level of Care Assessment Details Patient Name: [name] [name], [name] G. Date of Service: 03/20/2016 9:45 AM Medical Record Number: [id number] Patient Account  Number: [id number] Date of Birth/Sex: [birth year] (76 y.o. Female) Treating RN: Carolyne Fiscal, Debi Primary Care Tanetta Fuhriman: Tracie Harrier Other Clinician: Referring Dorethia Jeanmarie: Tracie Harrier Treating Brytney Somes/Extender: Frann Rider in Treatment: 0 Clinic Level of Care Assessment Items TOOL 2 Quantity Score X - Use when only an EandM is performed on the INITIAL visit 1 0 ASSESSMENTS - Nursing Assessment / Reassessment X - General Physical Exam (combine w/ comprehensive assessment (listed just 1 20 below) when performed on new pt. evals) X - Comprehensive Assessment (HX, ROS, Risk Assessments, Wounds Hx, etc.) 1 25 ASSESSMENTS - Wound and Skin Assessment / Reassessment X - Simple Wound Assessment / Reassessment - one wound 1 5 '[]'$  - Complex Wound Assessment / Reassessment - multiple wounds 0 '[]'$  - Dermatologic / Skin Assessment (not related to wound area) 0 ASSESSMENTS - Ostomy and/or Continence Assessment and Care '[]'$  - Incontinence Assessment and Management 0 '[]'$  - Ostomy Care Assessment and Management (repouching, etc.) 0 PROCESS - Coordination of Care '[]'$  - Simple Patient / Family Education for ongoing care 0 X - Complex (extensive) Patient / Family Education for ongoing care 1 20 X - Staff obtains Programmer, systems, Records, Test Results / Process Orders 1 10 X - Staff telephones HHA, Nursing Homes / Clarify orders / etc 1 10 '[]'$  - Routine Transfer to another Facility (non-emergent condition) 0 '[]'$  - Routine Hospital Admission (non-emergent condition) 0 X - New Admissions / Biomedical engineer / Ordering NPWT, Apligraf, etc. 1 15 '[]'$  - Emergency Hospital Admission (emergent condition) 0 '[]'$  - Simple Discharge Coordination 0 [name] [name], [name] NANGLE. ([id number]) '[]'$  - Complex (extensive) Discharge Coordination 0 PROCESS - Special Needs '[]'$  - Pediatric / Minor Patient Management 0 '[]'$  - Isolation Patient Management 0 '[]'$  - Hearing / Language / Visual special needs 0 '[]'$  - Assessment of Community  assistance (transportation, D/C planning, etc.) 0 '[]'$  - Additional assistance / Altered mentation 0 '[]'$  - Support Surface(s) Assessment (bed, cushion, seat, etc.) 0 INTERVENTIONS - Wound Cleansing / Measurement X - Wound Imaging (photographs - any number of wounds) 1 5 '[]'$  - Wound Tracing (instead of photographs) 0 '[]'$  - Simple Wound Measurement - one wound 0 X -  Complex Wound Measurement - multiple wounds 1 5 '[]'$  - Simple Wound Cleansing - one wound 0 X - Complex Wound Cleansing - multiple wounds 1 5 INTERVENTIONS - Wound Dressings '[]'$  - Small Wound Dressing one or multiple wounds 0 '[]'$  - Medium Wound Dressing one or multiple wounds 0 '[]'$  - Large Wound Dressing one or multiple wounds 0 '[]'$  - Application of Medications - injection 0 INTERVENTIONS - Miscellaneous '[]'$  - External ear exam 0 '[]'$  - Specimen Collection (cultures, biopsies, blood, body fluids, etc.) 0 '[]'$  - Specimen(s) / Culture(s) sent or taken to Lab for analysis 0 '[]'$  - Patient Transfer (multiple staff / Harrel Lemon Lift / Similar devices) 0 '[]'$  - Simple Staple / Suture removal (25 or less) 0 '[]'$  - Complex Staple / Suture removal (26 or more) 0 [name] [name], [name] G. ([id number]) '[]'$  - Hypo / Hyperglycemic Management (close monitor of Blood Glucose) 0 '[]'$  - Ankle / Brachial Index (ABI) - do not check if billed separately 0 Has the patient been seen at the hospital within the last three years: Yes Total Score: 120 Level Of Care: New/Established - Level 4 Electronic Signature(s) Signed: 03/20/2016 4:26:43 PM By: Alric Quan Entered By: Alric Quan on 03/20/2016 13:12:38 [name] [name], [name] [name] ([id number]) -------------------------------------------------------------------------------- Encounter Discharge Information Details Patient Name: [name], [name] Date of Service: 03/20/2016 9:45 AM Medical Record Number: [id number] Patient Account Number: [id number] Date of Birth/Sex: [birth year] (76 y.o. Female) Treating RN: Carolyne Fiscal, Debi Primary Care  Quavon Keisling: Tracie Harrier Other Clinician: Referring Kinslee Dalpe: Tracie Harrier Treating Mayu Ronk/Extender: Frann Rider in Treatment: 0 Encounter Discharge Information Items Discharge Pain Level: 0 Discharge Condition: Stable Ambulatory Status: Ambulatory Discharge Destination: Home Transportation: Private Auto Accompanied By: self Schedule Follow-up Appointment: Yes Medication Reconciliation completed and provided to Patient/Care No Tobe Kervin: Provided on Clinical Summary of Care: 03/20/2016 Form Type Recipient Paper Patient CM Electronic Signature(s) Signed: 03/20/2016 10:56:37 AM By: Ruthine Dose Entered By: Ruthine Dose on 03/20/2016 10:56:37 Josten, [name] [name] ([id number]) -------------------------------------------------------------------------------- Lower Extremity Assessment Details Patient Name: Korol, Shivaun G. Date of Service: 03/20/2016 9:45 AM Medical Record Number: [id number] Patient Account Number: [id number] Date of Birth/Sex: [birth year] (76 y.o. Female) Treating RN: Carolyne Fiscal, Debi Primary Care Dvante Hands: Tracie Harrier Other Clinician: Referring Sabeen Piechocki: Tracie Harrier Treating Arvil Utz/Extender: Frann Rider in Treatment: 0 Electronic Signature(s) Signed: 03/20/2016 4:26:43 PM By: Alric Quan Entered By: Alric Quan on 03/20/2016 10:00:14 Marsiglia, [name] [name] ([id number]) -------------------------------------------------------------------------------- Multi Wound Chart Details Patient Name: [name] [name], [name] G. Date of Service: 03/20/2016 9:45 AM Medical Record Number: [id number] Patient Account Number: [id number] Date of Birth/Sex: 11/24/1940 (76 y.o. Female) Treating RN: Carolyne Fiscal, Debi Primary Care Raeven Pint: Tracie Harrier Other Clinician: Referring Adriane Guglielmo: Tracie Harrier Treating Laban Orourke/Extender: Frann Rider in Treatment: 0 Vital Signs Height(in): 62 Pulse(bpm): 93 Weight(lbs): 107 Blood  Pressure 134/65 (mmHg): Body Mass Index(BMI): 20 Temperature(F): 97.9 Respiratory Rate 18 (breaths/min): Photos: [N/A:N/A] Wound Location: Sacrum - Midline N/A N/A Wounding Event: Pressure Injury N/A N/A Primary Etiology: Pressure Ulcer N/A N/A Comorbid History: Asthma, Chronic N/A N/A Obstructive Pulmonary Disease (COPD), Congestive Heart Failure, Osteoarthritis, Neuropathy, Received Chemotherapy, Received Radiation Date Acquired: 02/21/2016 N/A N/A Weeks of Treatment: 0 N/A N/A Wound Status: Open N/A N/A Measurements L x W x D 1.1x0.8x1.2 N/A N/A (cm) Area (cm) : 0.691 N/A N/A Volume (cm) : 0.829 N/A N/A % Reduction in Area: 0.00% N/A N/A % Reduction in Volume: 0.00% N/A N/A Starting Position 1 12 (o'clock): 12 Monarch, Jamila G. ([id number]) Ending Position 1 (o'clock): Maximum Distance 1 4 (cm): Undermining: Yes  N/A N/A Classification: Category/Stage IV N/A N/A Exudate Amount: Large N/A N/A Exudate Type: Serous N/A N/A Exudate Color: amber N/A N/A Wound Margin: Distinct, outline attached N/A N/A Granulation Amount: Medium (34-66%) N/A N/A Granulation Quality: Red N/A N/A Necrotic Amount: Medium (34-66%) N/A N/A Exposed Structures: Fascia: Yes N/A N/A Fat Layer (Subcutaneous Tissue) Exposed: Yes Muscle: Yes Epithelialization: None N/A N/A Periwound Skin Texture: No Abnormalities Noted N/A N/A Periwound Skin No Abnormalities Noted N/A N/A Moisture: Periwound Skin Color: No Abnormalities Noted N/A N/A Temperature: No Abnormality N/A N/A Tenderness on Yes N/A N/A Palpation: Wound Preparation: Ulcer Cleansing: N/A N/A Rinsed/Irrigated with Saline Topical Anesthetic Applied: Other: lidocaine 4% Treatment Notes Electronic Signature(s) Signed: 03/20/2016 11:09:04 AM By: Christin Fudge MD, FACS Entered By: Christin Fudge on 03/20/2016 11:09:03 [name] [name], [name] [name]  ([id number]) -------------------------------------------------------------------------------- [place] Details Patient Name: [name] [name], [name] G. Date of Service: 03/20/2016 9:45 AM Medical Record Number: [id number] Patient Account Number: [id number] Date of Birth/Sex: 08/13/1940 (76 y.o. Female) Treating RN: Carolyne Fiscal, Debi Primary Care Chenika Nevils: Tracie Harrier Other Clinician: Referring Manvir Thorson: Tracie Harrier Treating Lalah Durango/Extender: Frann Rider in Treatment: 0 Active Inactive ` Abuse / Safety / Falls / Self Care Management Nursing Diagnoses: Potential for falls Goals: Patient will remain injury free Date Initiated: 03/20/2016 Target Resolution Date: 07/19/2016 Goal Status: Active Interventions: Assess fall risk on admission and as needed Assess self care needs on admission and as needed Notes: ` Nutrition Nursing Diagnoses: Imbalanced nutrition Potential for alteratiion in Nutrition/Potential for imbalanced nutrition Goals: Patient/caregiver agrees to and verbalizes understanding of need to use nutritional supplements and/or vitamins as prescribed Date Initiated: 03/20/2016 Target Resolution Date: 06/21/2016 Goal Status: Active Interventions: Assess patient nutrition upon admission and as needed per policy Notes: ` Orientation to the Rives, CHISOM MUNTEAN ([id number]) Nursing Diagnoses: Knowledge deficit related to the wound healing center program Goals: Patient/caregiver will verbalize understanding of the [facility] Date Initiated: 03/20/2016 Target Resolution Date: 04/19/2016 Goal Status: Active Interventions: Provide education on orientation to the wound center Notes: ` Pain, Acute or Chronic Nursing Diagnoses: Pain, acute or chronic: actual or potential Potential alteration in comfort, pain Goals: Patient/caregiver will verbalize adequate pain control between visits Date Initiated: 03/20/2016 Target  Resolution Date: 07/19/2016 Goal Status: Active Interventions: Assess comfort goal upon admission Complete pain assessment as per visit requirements Notes: ` Wound/Skin Impairment Nursing Diagnoses: Impaired tissue integrity Knowledge deficit related to smoking impact on wound healing Knowledge deficit related to ulceration/compromised skin integrity Goals: Ulcer/skin breakdown will have a volume reduction of 80% by week 12 Date Initiated: 03/20/2016 Target Resolution Date: 07/19/2016 Goal Status: Active Interventions: Assess patient/caregiver ability to perform ulcer/skin care regimen upon admission and as needed [name] [name], [name] [name] ([id number]) Assess ulceration(s) every visit Notes: Electronic Signature(s) Signed: 03/20/2016 4:26:43 PM By: Alric Quan Entered By: Alric Quan on 03/20/2016 10:36:10 [name] [name], [name] [name] ([id number]) -------------------------------------------------------------------------------- Pain Assessment Details Patient Name: [name] [name], [name] G. Date of Service: 03/20/2016 9:45 AM Medical Record Number: [id number] Patient Account Number: [id number] Date of Birth/Sex: May 29, 1940 (76 y.o. Female) Treating RN: Carolyne Fiscal, Debi Primary Care Alease Fait: Tracie Harrier Other Clinician: Referring Rjay Revolorio: Tracie Harrier Treating Saleh Ulbrich/Extender: Frann Rider in Treatment: 0 Active Problems Location of Pain Severity and Description of Pain Patient Has Paino Yes Site Locations Pain Location: Pain in Ulcers With Dressing Change: Yes Duration of the Pain. Constant / Intermittento Constant Rate the pain. Current Pain Level: 6 Worst Pain Level: 10 Least Pain Level: 2 Character of Pain Describe the  Pain: Burning, Throbbing Pain Management and Medication Current Pain Management: Electronic Signature(s) Signed: 03/20/2016 4:26:43 PM By: Alric Quan Entered By: Alric Quan on 03/20/2016 09:44:24 Politte, [name] [name]  ([id number]) -------------------------------------------------------------------------------- Patient/Caregiver Education Details Patient Name: Duddy, Laveta G. Date of Service: 03/20/2016 9:45 AM Medical Record Number: [id number] Patient Account Number: [id number] Date of Birth/Gender: [birth year] (76 y.o. Female) Treating RN: Ahmed Prima Primary Care Physician: Tracie Harrier Other Clinician: Referring Physician: Tracie Harrier Treating Physician/Extender: Frann Rider in Treatment: 0 Education Assessment Education Provided To: Patient Education Topics Provided Welcome To [facility]: Handouts: Welcome To [facility] Methods: Explain/Verbal Responses: State content correctly Wound/Skin Impairment: Handouts: Other: change dressing as ordered Methods: Demonstration, Explain/Verbal Responses: State content correctly Electronic Signature(s) Signed: 03/20/2016 4:26:43 PM By: Alric Quan Entered By: Alric Quan on 03/20/2016 10:37:02 [place], [name] [name] ([id number]) -------------------------------------------------------------------------------- Wound Assessment Details Patient Name: Releford, Marletta G. Date of Service: 03/20/2016 9:45 AM Medical Record Number: [id number] Patient Account Number: [id number] Date of Birth/Sex: Apr 01, 1940 (76 y.o. Female) Treating RN: Carolyne Fiscal, Debi Primary Care Rukiya Hodgkins: Tracie Harrier Other Clinician: Referring Catrena Vari: Tracie Harrier Treating Zylpha Poynor/Extender: Frann Rider in Treatment: 0 Wound Status Wound Number: 1 Primary Pressure Ulcer Etiology: Wound Location: Sacrum - Midline Wound Open Wounding Event: Pressure Injury Status: Date Acquired: 02/21/2016 Comorbid Asthma, Chronic Obstructive Pulmonary Weeks Of Treatment: 0 History: Disease (COPD), Congestive Heart Clustered Wound: No Failure, Osteoarthritis, Neuropathy, Received Chemotherapy, Received Radiation Photos Photo Uploaded  By: Alric Quan on 03/20/2016 11:07:05 Wound Measurements Length: (cm) 1.1 % Reduction in Width: (cm) 0.8 % Reduction in Depth: (cm) 1.2 Epithelializati Area: (cm) 0.691 Tunneling: Volume: (cm) 0.829 Undermining: Starting Pos Ending Posit Maximum Dist Area: 0% Volume: 0% on: None No Yes ition (o'clock): 12 ion (o'clock): 12 ance: (cm) 4 Wound Description Classification: Category/Stage IV Wound Margin: Distinct, outline attached Exudate Amount: Large Exudate Type: Serous Exudate Color: amber Notaro, Jakyria G. ([id number]) Foul Odor After Cleansing: No Slough/Fibrino No Wound Bed Granulation Amount: Medium (34-66%) Exposed Structure Granulation Quality: Red Fascia Exposed: Yes Necrotic Amount: Medium (34-66%) Fat Layer (Subcutaneous Tissue) Exposed: Yes Necrotic Quality: Adherent Slough Muscle Exposed: Yes Necrosis of Muscle: No Periwound Skin Texture Texture Color No Abnormalities Noted: No No Abnormalities Noted: No Moisture Temperature / Pain No Abnormalities Noted: No Temperature: No Abnormality Tenderness on Palpation: Yes Wound Preparation Ulcer Cleansing: Rinsed/Irrigated with Saline Topical Anesthetic Applied: Other: lidocaine 4%, Electronic Signature(s) Signed: 03/20/2016 4:26:43 PM By: Alric Quan Entered By: Alric Quan on 03/20/2016 10:40:50 Petite, [name] [name] ([id number]) -------------------------------------------------------------------------------- [place] Details Patient Name: Arp, Nahlia G. Date of Service: 03/20/2016 9:45 AM Medical Record Number: [id number] Patient Account Number: [id number] Date of Birth/Sex: [birth year] (76 y.o. Female) Treating RN: Carolyne Fiscal, Debi Primary Care Merrick Maggio: Tracie Harrier Other Clinician: Referring Treazure Nery: Tracie Harrier Treating Lyn Deemer/Extender: Frann Rider in Treatment: 0 Vital Signs Time Taken: 09:44 Temperature (F): 97.9 Height (in): 62 Pulse (bpm): 93 Source:  Stated Respiratory Rate (breaths/min): 18 Weight (lbs): 107 Blood Pressure (mmHg): 134/65 Source: Measured Reference Range: 80 - 120 mg / dl Body Mass Index (BMI): 19.6 Electronic Signature(s) Signed: 03/20/2016 4:26:43 PM By: Alric Quan Entered By: Alric Quan on 03/20/2016 09:45:47

## 2016-03-25 DIAGNOSIS — M069 Rheumatoid arthritis, unspecified: Secondary | ICD-10-CM | POA: Diagnosis not present

## 2016-03-25 DIAGNOSIS — N390 Urinary tract infection, site not specified: Secondary | ICD-10-CM | POA: Diagnosis not present

## 2016-03-25 DIAGNOSIS — J449 Chronic obstructive pulmonary disease, unspecified: Secondary | ICD-10-CM | POA: Diagnosis not present

## 2016-03-25 DIAGNOSIS — D638 Anemia in other chronic diseases classified elsewhere: Secondary | ICD-10-CM | POA: Diagnosis not present

## 2016-03-25 DIAGNOSIS — I110 Hypertensive heart disease with heart failure: Secondary | ICD-10-CM | POA: Diagnosis not present

## 2016-03-25 DIAGNOSIS — I509 Heart failure, unspecified: Secondary | ICD-10-CM | POA: Diagnosis not present

## 2016-03-25 DIAGNOSIS — L89320 Pressure ulcer of left buttock, unstageable: Secondary | ICD-10-CM | POA: Diagnosis not present

## 2016-03-25 DIAGNOSIS — M797 Fibromyalgia: Secondary | ICD-10-CM | POA: Diagnosis not present

## 2016-03-25 DIAGNOSIS — G629 Polyneuropathy, unspecified: Secondary | ICD-10-CM | POA: Diagnosis not present

## 2016-03-27 ENCOUNTER — Encounter: Payer: Medicare HMO | Admitting: Surgery

## 2016-03-27 DIAGNOSIS — E441 Mild protein-calorie malnutrition: Secondary | ICD-10-CM | POA: Diagnosis not present

## 2016-03-27 DIAGNOSIS — M1990 Unspecified osteoarthritis, unspecified site: Secondary | ICD-10-CM | POA: Diagnosis not present

## 2016-03-27 DIAGNOSIS — Z880 Allergy status to penicillin: Secondary | ICD-10-CM | POA: Diagnosis not present

## 2016-03-27 DIAGNOSIS — L89153 Pressure ulcer of sacral region, stage 3: Secondary | ICD-10-CM | POA: Diagnosis not present

## 2016-03-27 DIAGNOSIS — Z888 Allergy status to other drugs, medicaments and biological substances status: Secondary | ICD-10-CM | POA: Diagnosis not present

## 2016-03-27 DIAGNOSIS — I5022 Chronic systolic (congestive) heart failure: Secondary | ICD-10-CM | POA: Diagnosis not present

## 2016-03-27 DIAGNOSIS — M797 Fibromyalgia: Secondary | ICD-10-CM | POA: Diagnosis not present

## 2016-03-27 DIAGNOSIS — F17218 Nicotine dependence, cigarettes, with other nicotine-induced disorders: Secondary | ICD-10-CM | POA: Diagnosis not present

## 2016-03-27 DIAGNOSIS — J449 Chronic obstructive pulmonary disease, unspecified: Secondary | ICD-10-CM | POA: Diagnosis not present

## 2016-03-27 NOTE — Progress Notes (Addendum)
Harbuck, DERRICA SIEG ([id number]) Visit Report for 03/27/2016 Chief Complaint Document Details Patient Name: [name] [name], [name] [name] 03/27/2016 12:30 Date of Service: PM Medical Record [id number] Number: Patient Account Number: [id number] [birth year] (76 y.o. Treating RN: Cornell Barman Date of Birth/Sex: Female) Other Clinician: Primary Care Provider: Tracie Harrier Treating Kanani Mowbray Referring Provider: Tracie Harrier Provider/Extender: Suella Grove in Treatment: 1 Information Obtained from: Patient Chief Complaint Patient is at the clinic for treatment of an open pressure ulcer to the sacral area which she's had for at least a month Electronic Signature(s) Signed: 03/27/2016 12:59:06 PM By: Christin Fudge MD, FACS Entered By: Christin Fudge on 03/27/2016 12:59:06 [name] [name], [name] [name] ([id number]) -------------------------------------------------------------------------------- HPI Details Patient Name: [name] [name], [name] G. 03/27/2016 12:30 Date of Service: PM Medical Record [id number] Number: Patient Account Number: [id number] 03/03/1940 (76 y.o. Treating RN: Cornell Barman Date of Birth/Sex: Female) Other Clinician: Primary Care Provider: Tracie Harrier Treating Eissa Buchberger Referring Provider: Tracie Harrier Provider/Extender: Weeks in Treatment: 1 History of Present Illness Location: sacral pressure ulcer Quality: Patient reports experiencing a dull pain to affected area(s). Severity: Patient states wound are getting worse. Duration: Patient has had the wound for < 5 weeks prior to presenting for treatment Timing: Pain in wound is constant (hurts all the time) Context: The wound appeared gradually over time Modifying Factors: Other treatment(s) tried include:local care by home health Associated Signs and Symptoms: Patient reports having increase discharge. HPI Description: 76 year old patient who has been sent to [place] by Dr. Ginette Pitman, has a past medical history of COPD, systolic CHF,  fibromyalgia, osteoarthritis, recurrent UTIs and a sacral pressure ulcer which has secretions and pain. Status post cystectomy in February 2017, cervical cancer with hysterectomy in 1990 and a left partial mastectomy in 2009. She is a smoker and smokes 8 cigarettes every day. 03/27/2016 -- x-ray of the sacrum and coccyx done on 03/21/2016 -- IMPRESSION:1. No plain radiographic evidence of osteomyelitis about the S5/coccygeal decubitus wound.If clinical suspicion persists Sacral or pelvic MRI would be most sensitive, followed by CT.2. Severe Calcified aortic atherosclerosis. the patient had a high copayment for a wound VAC and she could not afford this. Electronic Signature(s) Signed: 03/27/2016 1:00:50 PM By: Christin Fudge MD, FACS Entered By: Christin Fudge on 03/27/2016 13:00:50 [name] [name], [name] [name] ([id number]) -------------------------------------------------------------------------------- Physical Exam Details Patient Name: [name] [name], [name] G. 03/27/2016 12:30 Date of Service: PM Medical Record [id number] Number: Patient Account Number: [id number] 02/20/1940 (76 y.o. Treating RN: Cornell Barman Date of Birth/Sex: Female) Other Clinician: Primary Care Provider: Tracie Harrier Treating Ansleigh Safer Referring Provider: Tracie Harrier Provider/Extender: Weeks in Treatment: 1 Constitutional . Pulse regular. Respirations normal and unlabored. Afebrile. . Eyes Nonicteric. Reactive to light. Ears, Nose, Mouth, and Throat Lips, teeth, and gums WNL.[name] Moist mucosa without lesions. Neck supple and nontender. No palpable supraclavicular or cervical adenopathy. Normal sized without goiter. Respiratory WNL. No retractions.. Cardiovascular Pedal Pulses WNL. No clubbing, cyanosis or edema. Lymphatic No adneopathy. No adenopathy. No adenopathy. Musculoskeletal Adexa without tenderness or enlargement.. Digits and nails w/o clubbing, cyanosis, infection, petechiae, ischemia, or inflammatory  conditions.. Integumentary (Hair, Skin) No suspicious lesions. No crepitus or fluctuance. No peri-wound warmth or erythema. No masses.[name] Psychiatric Judgement and insight Intact.. No evidence of depression, anxiety, or agitation.. Notes the wound continues to have a lot of undermining in depth but she is unable to afford a wound VAC and hence we will have to continue with local care Electronic Signature(s) Signed: 03/27/2016 1:01:21 PM By: Christin Fudge MD, FACS Entered By: Christin Fudge on 03/27/2016 13:01:20  [name] [name], [name] [name] ([id number]) -------------------------------------------------------------------------------- Physician Orders Details Patient Name: [name] [name], [name] [name] 03/27/2016 12:30 Date of Service: PM Medical Record [id number] Number: Patient Account Number: [id number] [birth year] (76 y.o. Treating RN: Cornell Barman Date of Birth/Sex: Female) Other Clinician: Primary Care Provider: Tracie Harrier Treating Jax Abdelrahman Referring Provider: Tracie Harrier Provider/Extender: Suella Grove in Treatment: 1 Verbal / Phone Orders: No Diagnosis Coding ICD-10 Coding Code Description L89.153 Pressure ulcer of sacral region, stage 3 F17.218 Nicotine dependence, cigarettes, with other nicotine-induced disorders E44.1 Mild protein-calorie malnutrition P29.51 Chronic systolic (congestive) heart failure Wound Cleansing Wound #1 Midline Sacrum o Clean wound with Normal Saline. Anesthetic Wound #1 Midline Sacrum o Topical Lidocaine 4% cream applied to wound bed prior to debridement - for clinic use Skin Barriers/Peri-Wound Care Wound #1 Midline Sacrum o Skin Prep Primary Wound Dressing Wound #1 Midline Sacrum o Aquacel Ag - or equivalent packed into wound. Do not overpack. Secondary Dressing Wound #1 Midline Sacrum o Boardered Foam Dressing Follow-up Appointments Wound #1 Midline Sacrum o Return Appointment in 1 week. Off-Loading [employer], KIERSTYN BARANOWSKI ([id number]) Wound #1  Midline Sacrum o Turn and reposition every 2 hours Additional Orders / Instructions Wound #1 Midline Sacrum o Stop Smoking o Increase protein intake. o Other: - Vitamins A , C and Zinc Home Health Wound #1 Midline [place] Visits - [place] Nurse may visit PRN to address patientos wound care needs. o FACE TO FACE ENCOUNTER: MEDICARE and MEDICAID PATIENTS: I certify that this patient is under my care and that I had a face-to-face encounter that meets the physician face-to-face encounter requirements with this patient on this date. The encounter with the patient was in whole or in part for the following MEDICAL CONDITION: (primary reason for [place]) MEDICAL NECESSITY: I certify, that based on my findings, NURSING services are a medically necessary home health service. HOME BOUND STATUS: I certify that my clinical findings support that this patient is homebound (i.e., Due to illness or injury, pt requires aid of supportive devices such as crutches, cane, wheelchairs, walkers, the use of special transportation or the assistance of another person to leave their place of residence. There is a normal inability to leave the home and doing so requires considerable and taxing effort. Other absences are for medical reasons / religious services and are infrequent or of short duration when for other reasons). o If current dressing causes regression in wound condition, may D/C ordered dressing product/s and apply Normal Saline Moist Dressing daily until next [place] / Other MD appointment. [place] of regression in wound condition at [phone]. o Please direct any NON-WOUND related issues/requests for orders to patient's Primary Care Physician Electronic Signature(s) Signed: 03/27/2016 4:33:40 PM By: Christin Fudge MD, FACS Signed: 03/27/2016 5:26:13 PM By: Gretta Cool RN, BSN, [name] RN, BSN Entered By: Gretta Cool, RN, BSN,  [name] on 03/27/2016 13:19:49 Dalto, [name] [name] ([id number]) -------------------------------------------------------------------------------- Problem List Details Patient Name: [name] [name], [name] [name] 03/27/2016 12:30 Date of Service: PM Medical Record [id number] Number: Patient Account Number: [id number] Mar 07, 1940 (76 y.o. Treating RN: Cornell Barman Date of Birth/Sex: Female) Other Clinician: Primary Care Provider: Tracie Harrier Treating Bhavin Monjaraz Referring Provider: Tracie Harrier Provider/Extender: Suella Grove in Treatment: 1 Active Problems ICD-10 Encounter Code Description Active Date Diagnosis L89.153 Pressure ulcer of sacral region, stage 3 03/20/2016 Yes F17.218 Nicotine dependence, cigarettes, with other nicotine- 03/20/2016 Yes induced disorders E44.1 Mild protein-calorie malnutrition 03/20/2016 Yes E83.15 Chronic systolic (congestive) heart failure 03/20/2016 Yes Inactive Problems Resolved  Problems Electronic Signature(s) Signed: 03/27/2016 12:58:53 PM By: Christin Fudge MD, FACS Entered By: Christin Fudge on 03/27/2016 12:58:52 [place], [name] [name] ([id number]) -------------------------------------------------------------------------------- Progress Note Details Patient Name: [name] [name], [name] G. 03/27/2016 12:30 Date of Service: PM Medical Record [id number] Number: Patient Account Number: [id number] 1940/03/05 (76 y.o. Treating RN: Cornell Barman Date of Birth/Sex: Female) Other Clinician: Primary Care Provider: Tracie Harrier Treating Lamin Chandley Referring Provider: Tracie Harrier Provider/Extender: Suella Grove in Treatment: 1 Subjective Chief Complaint Information obtained from Patient Patient is at the clinic for treatment of an open pressure ulcer to the sacral area which she's had for at least a month History of Present Illness (HPI) The following HPI elements were documented for the patient's wound: Location: sacral pressure ulcer Quality: Patient reports experiencing a dull  pain to affected area(s). Severity: Patient states wound are getting worse. Duration: Patient has had the wound for < 5 weeks prior to presenting for treatment Timing: Pain in wound is constant (hurts all the time) Context: The wound appeared gradually over time Modifying Factors: Other treatment(s) tried include:local care by home health Associated Signs and Symptoms: Patient reports having increase discharge. 76 year old patient who has been sent to [place] by Dr. Ginette Pitman, has a past medical history of COPD, systolic CHF, fibromyalgia, osteoarthritis, recurrent UTIs and a sacral pressure ulcer which has secretions and pain. Status post cystectomy in February 2017, cervical cancer with hysterectomy in 1990 and a left partial mastectomy in 2009. She is a smoker and smokes 8 cigarettes every day. 03/27/2016 -- x-ray of the sacrum and coccyx done on 03/21/2016 -- IMPRESSION:1. No plain radiographic evidence of osteomyelitis about the S5/coccygeal decubitus wound.If clinical suspicion persists Sacral or pelvic MRI would be most sensitive, followed by CT.2. Severe Calcified aortic atherosclerosis. the patient had a high copayment for a wound VAC and she could not afford this. Objective Constitutional [name] [name], [name] MAHEU. ([id number]) Pulse regular. Respirations normal and unlabored. Afebrile. Vitals Time Taken: 12:37 PM, Height: 62 in, Weight: 107 lbs, BMI: 19.6, Temperature: 97.8 F, Pulse: 88 bpm, Respiratory Rate: 16 breaths/min, Blood Pressure: 110/57 mmHg. Eyes Nonicteric. Reactive to light. Ears, Nose, Mouth, and Throat Lips, teeth, and gums WNL.[name] Moist mucosa without lesions. Neck supple and nontender. No palpable supraclavicular or cervical adenopathy. Normal sized without goiter. Respiratory WNL. No retractions.. Cardiovascular Pedal Pulses WNL. No clubbing, cyanosis or edema. Lymphatic No adneopathy. No adenopathy. No adenopathy. Musculoskeletal Adexa without tenderness or enlargement..  Digits and nails w/o clubbing, cyanosis, infection, petechiae, ischemia, or inflammatory conditions.[name] Psychiatric Judgement and insight Intact.. No evidence of depression, anxiety, or agitation.. General Notes: the wound continues to have a lot of undermining in depth but she is unable to afford a wound VAC and hence we will have to continue with local care Integumentary (Hair, Skin) No suspicious lesions. No crepitus or fluctuance. No peri-wound warmth or erythema. No masses.. Wound #1 status is Open. Original cause of wound was Pressure Injury. The wound is located on the Midline Sacrum. The wound measures 1.1cm length x 0.5cm width x 1.4cm depth; 0.432cm^2 area and 0.605cm^3 volume. There is Fat Layer (Subcutaneous Tissue) Exposed exposed. There is no tunneling noted, however, there is undermining starting at 12:00 and ending at 6:00 with a maximum distance of 3.2cm. There is a large amount of serous drainage noted. The wound margin is distinct with the outline attached to the wound base. There is medium (34-66%) red granulation within the wound bed. There is no necrotic tissue within the wound bed. The periwound skin appearance  exhibited: Maceration. The periwound skin appearance did not exhibit: Callus, Crepitus, Excoriation, Induration, Rash, Scarring, Dry/Scaly, Atrophie Blanche, Cyanosis, Ecchymosis, Hemosiderin Staining, Mottled, Pallor, Rubor, Erythema. Periwound temperature was noted as No Abnormality. The periwound has tenderness on palpation. General Notes: unable to view wound base due to size of wound opening. [name] [name], [name] [name] ([id number]) Assessment Active Problems ICD-10 L89.153 - Pressure ulcer of sacral region, stage 3 F17.218 - Nicotine dependence, cigarettes, with other nicotine-induced disorders E44.1 - Mild protein-calorie malnutrition X10.62 - Chronic systolic (congestive) heart failure Plan Wound Cleansing: Wound #1 Midline Sacrum: Clean wound with Normal  Saline. Anesthetic: Wound #1 Midline Sacrum: Topical Lidocaine 4% cream applied to wound bed prior to debridement - for clinic use Skin Barriers/Peri-Wound Care: Wound #1 Midline Sacrum: Skin Prep Primary Wound Dressing: Wound #1 Midline Sacrum: Aquacel Ag - or equivalent packed into wound. Do not overpack. Secondary Dressing: Wound #1 Midline Sacrum: Boardered Foam Dressing Follow-up Appointments: Wound #1 Midline Sacrum: Return Appointment in 1 week. Off-Loading: Wound #1 Midline Sacrum: Turn and reposition every 2 hours Additional Orders / Instructions: Wound #1 Midline Sacrum: Stop Smoking Increase protein intake. Other: - Vitamins A , C and Zinc Home Health: Wound #1 Midline Sacrum: Continue Home Health Visits - [place] Nurse may visit PRN to address patient s wound care needs. FACE TO FACE ENCOUNTER: MEDICARE and MEDICAID PATIENTS: I certify that this patient is under [place], TENNILE STYLES. ([id number]) my care and that I had a face-to-face encounter that meets the physician face-to-face encounter requirements with this patient on this date. The encounter with the patient was in whole or in part for the following MEDICAL CONDITION: (primary reason for [place]) MEDICAL NECESSITY: I certify, that based on my findings, NURSING services are a medically necessary home health service. HOME BOUND STATUS: I certify that my clinical findings support that this patient is homebound (i.e., Due to illness or injury, pt requires aid of supportive devices such as crutches, cane, wheelchairs, walkers, the use of special transportation or the assistance of another person to leave their place of residence. There is a normal inability to leave the home and doing so requires considerable and taxing effort. Other absences are for medical reasons / religious services and are infrequent or of short duration when for other reasons). If current dressing causes regression in wound  condition, may D/C ordered dressing product/s and apply Normal Saline Moist Dressing daily until next [place] / Other MD appointment. [place] of regression in wound condition at [phone]. Please direct any NON-WOUND related issues/requests for orders to patient's Primary Care Physician After review I have recommended: 1. Wound VAC application to be changed 3 times a week -- she cannot afford the copayment on this and hence she has declined 2. packed the wound with silver alginate and this is to be done daily 3. X-ray of the sacral area to be done -- results reviewed with her 4. discussing with her the need to completely give up smoking. 5. adequate protein, vitamin A, vitamin C and zinc all questions answered and she says she will be compliant Electronic Signature(s) Signed: 03/27/2016 4:35:50 PM By: Christin Fudge MD, FACS Previous Signature: 03/27/2016 1:02:37 PM Version By: Christin Fudge MD, FACS Entered By: Christin Fudge on 03/27/2016 16:35:50 Mirsky, [name] [name] ([id number]) -------------------------------------------------------------------------------- SuperBill Details Patient Name: Mcinturff, Sheina G. Date of Service: 03/27/2016 Medical Record Number: [id number] Patient Account Number: [id number] Date of Birth/Sex: 05-Aug-1940 (76 y.o. Female) Treating RN: Cornell Barman Primary  Care Provider: Tracie Harrier Other Clinician: Referring Provider: Tracie Harrier Treating Provider/Extender: Christin Fudge Service Line: Outpatient Weeks in Treatment: 1 Diagnosis Coding ICD-10 Codes Code Description L89.153 Pressure ulcer of sacral region, stage 3 F17.218 Nicotine dependence, cigarettes, with other nicotine-induced disorders E44.1 Mild protein-calorie malnutrition V88.67 Chronic systolic (congestive) heart failure Facility Procedures CPT4 Code: [id number] Description: [id number] - WOUND CARE VISIT-LEV 2 EST PT Modifier: Quantity: 1 Physician  Procedures CPT4 Code Description: [id number] - WC PHYS LEVEL 3 - EST PT ICD-10 Description Diagnosis L89.153 Pressure ulcer of sacral region, stage 3 F17.218 Nicotine dependence, cigarettes, with other nicotin E44.1 Mild protein-calorie malnutrition D89.78  Chronic systolic (congestive) heart failure Modifier: e-induced diso Quantity: 1 rders Electronic Signature(s) Signed: 03/27/2016 4:33:40 PM By: Christin Fudge MD, FACS Signed: 03/27/2016 5:26:13 PM By: Gretta Cool RN, BSN, [name] RN, BSN Previous Signature: 03/27/2016 1:03:38 PM Version By: Christin Fudge MD, FACS Entered By: Gretta Cool RN, BSN, [name] on 03/27/2016 13:04:44

## 2016-03-28 DIAGNOSIS — G629 Polyneuropathy, unspecified: Secondary | ICD-10-CM | POA: Diagnosis not present

## 2016-03-28 DIAGNOSIS — D638 Anemia in other chronic diseases classified elsewhere: Secondary | ICD-10-CM | POA: Diagnosis not present

## 2016-03-28 DIAGNOSIS — N390 Urinary tract infection, site not specified: Secondary | ICD-10-CM | POA: Diagnosis not present

## 2016-03-28 DIAGNOSIS — J449 Chronic obstructive pulmonary disease, unspecified: Secondary | ICD-10-CM | POA: Diagnosis not present

## 2016-03-28 DIAGNOSIS — M797 Fibromyalgia: Secondary | ICD-10-CM | POA: Diagnosis not present

## 2016-03-28 DIAGNOSIS — L89320 Pressure ulcer of left buttock, unstageable: Secondary | ICD-10-CM | POA: Diagnosis not present

## 2016-03-28 DIAGNOSIS — M069 Rheumatoid arthritis, unspecified: Secondary | ICD-10-CM | POA: Diagnosis not present

## 2016-03-28 DIAGNOSIS — I509 Heart failure, unspecified: Secondary | ICD-10-CM | POA: Diagnosis not present

## 2016-03-28 DIAGNOSIS — I110 Hypertensive heart disease with heart failure: Secondary | ICD-10-CM | POA: Diagnosis not present

## 2016-03-28 NOTE — Progress Notes (Addendum)
Spieler, KENYATTE GRUBER ([id number]) Visit Report for 03/27/2016 Arrival Information Details Patient Name: [name], [name] Date of Service: 03/27/2016 12:30 PM Medical Record Number: [id number] Patient Account Number: [id number] Date of Birth/Sex: [birth year] (76 y.o. Female) Treating RN: Cornell Barman Primary Care Neha Waight: Tracie Harrier Other Clinician: Referring Mikael Debell: Tracie Harrier Treating Tiani Stanbery/Extender: Frann Rider in Treatment: 1 Visit Information History Since Last Visit Added or deleted any medications: No Patient Arrived: Ambulatory Any new allergies or adverse reactions: No Arrival Time: 12:36 Had a fall or experienced change in No Accompanied By: self activities of daily living that may affect Transfer Assistance: None risk of falls: Patient Identification Verified: Yes Signs or symptoms of abuse/neglect since last No Secondary Verification Process Yes visito Completed: Hospitalized since last visit: No Patient Requires Transmission-Based No Has Dressing in Place as Prescribed: Yes Precautions: Pain Present Now: No Patient Has Alerts: No Electronic Signature(s) Signed: 03/27/2016 5:26:13 PM By: Gretta Cool, RN, BSN, [name] RN, BSN Entered By: Gretta Cool, RN, BSN, [name] on 03/27/2016 12:37:21 Sundby, [name] ([id number]) -------------------------------------------------------------------------------- Clinic Level of Care Assessment Details Patient Name: [name], [name] [name]. Date of Service: 03/27/2016 12:30 PM Medical Record Number: [id number] Patient Account Number: [id number] Date of Birth/Sex: 09/12/1940 (77 y.o. Female) Treating RN: Cornell Barman Primary Care Artemio Dobie: Tracie Harrier Other Clinician: Referring Ladine Kiper: Tracie Harrier Treating Nayomi Tabron/Extender: Frann Rider in Treatment: 1 Clinic Level of Care Assessment Items TOOL 4 Quantity Score '[]'$  - Use when only an EandM is performed on FOLLOW-UP visit 0 ASSESSMENTS - Nursing Assessment /  Reassessment '[]'$  - Reassessment of Co-morbidities (includes updates in patient status) 0 X - Reassessment of Adherence to Treatment Plan 1 5 ASSESSMENTS - Wound and Skin Assessment / Reassessment X - Simple Wound Assessment / Reassessment - one wound 1 5 '[]'$  - Complex Wound Assessment / Reassessment - multiple wounds 0 '[]'$  - Dermatologic / Skin Assessment (not related to wound area) 0 ASSESSMENTS - Focused Assessment '[]'$  - Circumferential Edema Measurements - multi extremities 0 '[]'$  - Nutritional Assessment / Counseling / Intervention 0 '[]'$  - Lower Extremity Assessment (monofilament, tuning fork, pulses) 0 '[]'$  - Peripheral Arterial Disease Assessment (using hand held doppler) 0 ASSESSMENTS - Ostomy and/or Continence Assessment and Care '[]'$  - Incontinence Assessment and Management 0 '[]'$  - Ostomy Care Assessment and Management (repouching, etc.) 0 PROCESS - Coordination of Care X - Simple Patient / Family Education for ongoing care 1 15 '[]'$  - Complex (extensive) Patient / Family Education for ongoing care 0 X - Staff obtains Programmer, systems, Records, Test Results / Process Orders 1 10 '[]'$  - Staff telephones HHA, Nursing Homes / Clarify orders / etc 0 '[]'$  - Routine Transfer to another Facility (non-emergent condition) 0 [name], [name] ([id number]) '[]'$  - Routine Hospital Admission (non-emergent condition) 0 '[]'$  - New Admissions / Biomedical engineer / Ordering NPWT, Apligraf, etc. 0 '[]'$  - Emergency Hospital Admission (emergent condition) 0 X - Simple Discharge Coordination 1 10 '[]'$  - Complex (extensive) Discharge Coordination 0 PROCESS - Special Needs '[]'$  - Pediatric / Minor Patient Management 0 '[]'$  - Isolation Patient Management 0 '[]'$  - Hearing / Language / Visual special needs 0 '[]'$  - Assessment of Community assistance (transportation, D/C planning, etc.) 0 '[]'$  - Additional assistance / Altered mentation 0 '[]'$  - Support Surface(s) Assessment (bed, cushion, seat, etc.) 0 INTERVENTIONS - Wound Cleansing /  Measurement X - Simple Wound Cleansing - one wound 1 5 '[]'$  - Complex Wound Cleansing - multiple wounds 0 X - Wound Imaging (photographs - any number of  wounds) 1 5 '[]'$  - Wound Tracing (instead of photographs) 0 X - Simple Wound Measurement - one wound 1 5 '[]'$  - Complex Wound Measurement - multiple wounds 0 INTERVENTIONS - Wound Dressings X - Small Wound Dressing one or multiple wounds 1 10 '[]'$  - Medium Wound Dressing one or multiple wounds 0 '[]'$  - Large Wound Dressing one or multiple wounds 0 '[]'$  - Application of Medications - topical 0 '[]'$  - Application of Medications - injection 0 INTERVENTIONS - Miscellaneous '[]'$  - External ear exam 0 [name], [name] ([id number]) '[]'$  - Specimen Collection (cultures, biopsies, blood, body fluids, etc.) 0 '[]'$  - Specimen(s) / Culture(s) sent or taken to Lab for analysis 0 '[]'$  - Patient Transfer (multiple staff / Harrel Lemon Lift / Similar devices) 0 '[]'$  - Simple Staple / Suture removal (25 or less) 0 '[]'$  - Complex Staple / Suture removal (26 or more) 0 '[]'$  - Hypo / Hyperglycemic Management (close monitor of Blood Glucose) 0 '[]'$  - Ankle / Brachial Index (ABI) - do not check if billed separately 0 X - Vital Signs 1 5 Has the patient been seen at the hospital within the last three years: Yes Total Score: 75 Level Of Care: New/Established - Level 2 Electronic Signature(s) Signed: 03/27/2016 5:26:13 PM By: Gretta Cool, RN, BSN, [name] RN, BSN Entered By: Gretta Cool, RN, BSN, [name] on 03/27/2016 13:04:33 [name], [name] ([id number]) -------------------------------------------------------------------------------- Encounter Discharge Information Details Patient Name: [name], [name] [name]. Date of Service: 03/27/2016 12:30 PM Medical Record Number: [id number] Patient Account Number: [id number] Date of Birth/Sex: Dec 21, 1940 (76 y.o. Female) Treating RN: Cornell Barman Primary Care Aliany Fiorenza: Tracie Harrier Other Clinician: Referring Derion Kreiter: Tracie Harrier Treating Janayah Zavada/Extender: Frann Rider in Treatment: 1 Encounter Discharge Information Items Discharge Pain Level: 0 Discharge Condition: Stable Ambulatory Status: Ambulatory Discharge Destination: Home Transportation: Private Auto Accompanied By: friend Schedule Follow-up Appointment: Yes Medication Reconciliation completed and provided to Patient/Care Yes Kendyn Zaman: Provided on Clinical Summary of Care: 03/27/2016 Form Type Recipient Paper Patient CM Electronic Signature(s) Signed: 03/27/2016 5:26:13 PM By: Gretta Cool RN, BSN, [name] RN, BSN Previous Signature: 03/27/2016 1:05:02 PM Version By: Ruthine Dose Entered By: Gretta Cool RN, BSN, [name] on 03/27/2016 13:07:16 Colquhoun, [name] ([id number]) -------------------------------------------------------------------------------- General Visit Notes Details Patient Name: [name], [name] [name]. Date of Service: 03/27/2016 12:30 PM Medical Record Number: [id number] Patient Account Number: [id number] Date of Birth/Sex: 02/17/1940 (76 y.o. Female) Treating RN: Cornell Barman Primary Care Tawona Filsinger: Tracie Harrier Other Clinician: Referring Randy Whitener: Tracie Harrier Treating Akeyla Molden/Extender: Frann Rider in Treatment: 1 Notes Patient cannot afford the copayments associated with the wound vac. Electronic Signature(s) Signed: 03/27/2016 5:26:13 PM By: Gretta Cool, RN, BSN, [name] RN, BSN Entered By: Gretta Cool, RN, BSN, [name] on 03/27/2016 13:09:37 Pizzuto, [name] ([id number]) -------------------------------------------------------------------------------- Lower Extremity Assessment Details Patient Name: [name], [name] [name]. Date of Service: 03/27/2016 12:30 PM Medical Record Number: [id number] Patient Account Number: [id number] Date of Birth/Sex: 1940-06-03 (76 y.o. Female) Treating RN: Cornell Barman Primary Care Harrol Novello: Tracie Harrier Other Clinician: Referring Melburn Treiber: Tracie Harrier Treating Dulcey Riederer/Extender: Frann Rider in Treatment: 1 Electronic Signature(s) Signed:  03/27/2016 5:26:13 PM By: Gretta Cool RN, BSN, [name] RN, BSN Entered By: Gretta Cool, RN, BSN, [name] on 03/27/2016 12:46:45 [place], [name] ([id number]) -------------------------------------------------------------------------------- Multi Wound Chart Details Patient Name: [name], [name] [name]. Date of Service: 03/27/2016 12:30 PM Medical Record Number: [id number] Patient Account Number: [id number] Date of Birth/Sex: 03/31/1940 (76 y.o. Female) Treating RN: Cornell Barman Primary Care Yetunde Leis: Tracie Harrier Other Clinician: Referring Sakura Denis: Tracie Harrier Treating Metztli Sachdev/Extender: Frann Rider in Treatment:  1 Vital Signs Height(in): 62 Pulse(bpm): 88 Weight(lbs): 107 Blood Pressure 110/57 (mmHg): Body Mass Index(BMI): 20 Temperature(F): 97.8 Respiratory Rate 16 (breaths/min): Photos: [1:No Photos] [N/A:N/A] Wound Location: [1:Sacrum - Midline] [N/A:N/A] Wounding Event: [1:Pressure Injury] [N/A:N/A] Primary Etiology: [1:Pressure Ulcer] [N/A:N/A] Comorbid History: [1:Asthma, Chronic Obstructive Pulmonary Disease (COPD), Congestive Heart Failure, Osteoarthritis, Neuropathy, Received Chemotherapy, Received Radiation] [N/A:N/A] Date Acquired: [1:02/21/2016] [N/A:N/A] Weeks of Treatment: [1:1] [N/A:N/A] Wound Status: [1:Open] [N/A:N/A] Measurements L x W x D 1.1x0.5x1.4 [N/A:N/A] (cm) Area (cm) : [1:0.432] [N/A:N/A] Volume (cm) : [1:0.605] [N/A:N/A] % Reduction in Area: [1:37.50%] [N/A:N/A] % Reduction in Volume: 27.00% [N/A:N/A] Starting Position 1 12 (o'clock): Ending Position 1 [1:6] (o'clock): Maximum Distance 1 3.2 (cm): Undermining: [1:Yes] [N/A:N/A] Classification: [1:Category/Stage IV] [N/A:N/A] Exudate Amount: [1:Large] [N/A:N/A] Exudate Type: Serous N/A N/A Exudate Color: amber N/A N/A Wound Margin: Distinct, outline attached N/A N/A Granulation Amount: Medium (34-66%) N/A N/A Granulation Quality: Red N/A N/A Necrotic Amount: None Present (0%) N/A N/A Exposed  Structures: Fat Layer (Subcutaneous N/A N/A Tissue) Exposed: Yes Fascia: No Tendon: No Muscle: No Joint: No Bone: No Epithelialization: None N/A N/A Periwound Skin Texture: Excoriation: No N/A N/A Induration: No Callus: No Crepitus: No Rash: No Scarring: No Periwound Skin Maceration: Yes N/A N/A Moisture: Dry/Scaly: No Periwound Skin Color: Atrophie Blanche: No N/A N/A Cyanosis: No Ecchymosis: No Erythema: No Hemosiderin Staining: No Mottled: No Pallor: No Rubor: No Temperature: No Abnormality N/A N/A Tenderness on Yes N/A N/A Palpation: Wound Preparation: Ulcer Cleansing: N/A N/A Rinsed/Irrigated with Saline Topical Anesthetic Applied: Other: lidocaine 4% Assessment Notes: unable to view wound N/A N/A base due to size of wound opening. Treatment Notes Electronic Signature(s) Signed: 03/27/2016 12:58:59 PM By: Christin Fudge MD, FACS Entered By: Christin Fudge on 03/27/2016 12:58:58 Doner, [name] ([id number]) [place], [name] ([id number]) -------------------------------------------------------------------------------- [place] Details Patient Name: [name], [name] [name]. Date of Service: 03/27/2016 12:30 PM Medical Record Number: [id number] Patient Account Number: [id number] Date of Birth/Sex: 02-12-1940 (76 y.o. Female) Treating RN: Cornell Barman Primary Care Merle Whitehorn: Tracie Harrier Other Clinician: Referring Meeya Goldin: Tracie Harrier Treating [place] Dube/Extender: Frann Rider in Treatment: 1 Active Inactive Electronic Signature(s) Signed: 05/02/2016 12:06:51 PM By: Gretta Cool RN, BSN, [name] RN, BSN Previous Signature: 03/27/2016 5:26:13 PM Version By: Gretta Cool RN, BSN, [name] RN, BSN Entered By: Gretta Cool, RN, BSN, [name] on 05/02/2016 12:06:50 Riege, [name] ([id number]) -------------------------------------------------------------------------------- Pain Assessment Details Patient Name: [name], [name] [name]. Date of Service: 03/27/2016 12:30 PM Medical  Record Number: [id number] Patient Account Number: [id number] Date of Birth/Sex: 1940-02-11 (76 y.o. Female) Treating RN: Cornell Barman Primary Care Quintasha Gren: Tracie Harrier Other Clinician: Referring Amrita Radu: Tracie Harrier Treating Marybeth Dandy/Extender: Frann Rider in Treatment: 1 Active Problems Location of Pain Severity and Description of Pain Patient Has Paino No Site Locations With Dressing Change: No Pain Management and Medication Current Pain Management: Electronic Signature(s) Signed: 03/27/2016 5:26:13 PM By: Gretta Cool, RN, BSN, [name] RN, BSN Entered By: Gretta Cool, RN, BSN, [name] on 03/27/2016 12:37:31 [place], [name] ([id number]) -------------------------------------------------------------------------------- Patient/Caregiver Education Details Patient Name: [name], [name] [name]. Date of Service: 03/27/2016 12:30 PM Medical Record Number: [id number] Patient Account Number: [id number] Date of Birth/Gender: [birth year] (76 y.o. Female) Treating RN: Cornell Barman Primary Care Physician: Tracie Harrier Other Clinician: Referring Physician: Tracie Harrier Treating Physician/Extender: Frann Rider in Treatment: 1 Education Assessment Education Provided To: Patient Education Topics Provided Pressure: Handouts: Pressure Ulcers: Care and Offloading Methods: Demonstration, Explain/Verbal Responses: State content correctly Wound/Skin Impairment: Handouts: Caring for Your Ulcer Methods: Explain/Verbal Responses: State content correctly  Electronic Signature(s) Signed: 03/27/2016 5:26:13 PM By: Gretta Cool, RN, BSN, [name] RN, BSN Entered By: Gretta Cool, RN, BSN, [name] on 03/27/2016 13:08:00 Lona, [name] ([id number]) -------------------------------------------------------------------------------- Wound Assessment Details Patient Name: [name], [name] [name]. Date of Service: 03/27/2016 12:30 PM Medical Record Number: [id number] Patient Account Number: [id number] Date of Birth/Sex: [birth year]  (76 y.o. Female) Treating RN: Cornell Barman Primary Care Waneta Fitting: Tracie Harrier Other Clinician: Referring Tyyne Cliett: Tracie Harrier Treating Tiant Peixoto/Extender: Frann Rider in Treatment: 1 Wound Status Wound Number: 1 Primary Pressure Ulcer Etiology: Wound Location: Sacrum - Midline Wound Open Wounding Event: Pressure Injury Status: Date Acquired: 02/21/2016 Comorbid Asthma, Chronic Obstructive Pulmonary Weeks Of Treatment: 1 History: Disease (COPD), Congestive Heart Clustered Wound: No Failure, Osteoarthritis, Neuropathy, Received Chemotherapy, Received Radiation Wound Measurements Length: (cm) 1.1 Width: (cm) 0.5 Depth: (cm) 1.4 Area: (cm) 0.432 Volume: (cm) 0.605 % Reduction in Area: 37.5% % Reduction in Volume: 27% Epithelialization: None Tunneling: No Undermining: Yes Starting Position (o'clock): 12 Ending Position (o'clock): 6 Maximum Distance: (cm) 3.2 Wound Description Classification: Category/Stage IV Foul Odor Af Wound Margin: Distinct, outline attached Slough/Fibri Exudate Amount: Large Exudate Type: Serous Exudate Color: amber ter Cleansing: No no No Wound Bed Granulation Amount: Medium (34-66%) Exposed Structure Granulation Quality: Red Fascia Exposed: No Necrotic Amount: None Present (0%) Fat Layer (Subcutaneous Tissue) Exposed: Yes Tendon Exposed: No Muscle Exposed: No Joint Exposed: No Bone Exposed: No Periwound Skin Texture [name], [name] [name]. ([id number]) Texture Color No Abnormalities Noted: No No Abnormalities Noted: No Callus: No Atrophie Blanche: No Crepitus: No Cyanosis: No Excoriation: No Ecchymosis: No Induration: No Erythema: No Rash: No Hemosiderin Staining: No Scarring: No Mottled: No Pallor: No Moisture Rubor: No No Abnormalities Noted: No Dry / Scaly: No Temperature / Pain Maceration: Yes Temperature: No Abnormality Tenderness on Palpation: Yes Wound Preparation Ulcer Cleansing: Rinsed/Irrigated  with Saline Topical Anesthetic Applied: Other: lidocaine 4%, Assessment Notes unable to view wound base due to size of wound opening. Electronic Signature(s) Signed: 03/27/2016 5:26:13 PM By: Gretta Cool, RN, BSN, [name] RN, BSN Entered By: Gretta Cool, RN, BSN, [name] on 03/27/2016 12:46:20 [place], [name] ([id number]) -------------------------------------------------------------------------------- [place] Details Patient Name: Milberger, [name] [name]. Date of Service: 03/27/2016 12:30 PM Medical Record Number: [id number] Patient Account Number: [id number] Date of Birth/Sex: 09-19-1940 (76 y.o. Female) Treating RN: Cornell Barman Primary Care [name]: Tracie Harrier Other Clinician: Referring Aubry Rankin: Tracie Harrier Treating Zylan Almquist/Extender: Frann Rider in Treatment: 1 Vital Signs Time Taken: 12:37 Temperature (F): 97.8 Height (in): 62 Pulse (bpm): 88 Weight (lbs): 107 Respiratory Rate (breaths/min): 16 Body Mass Index (BMI): 19.6 Blood Pressure (mmHg): 110/57 Reference Range: 80 - 120 mg / dl Electronic Signature(s) Signed: 03/27/2016 5:26:13 PM By: Gretta Cool, RN, BSN, [name] RN, BSN Entered By: Gretta Cool, RN, BSN, [name] on 03/27/2016 12:37:53

## 2016-03-29 DIAGNOSIS — I110 Hypertensive heart disease with heart failure: Secondary | ICD-10-CM | POA: Diagnosis not present

## 2016-03-29 DIAGNOSIS — J449 Chronic obstructive pulmonary disease, unspecified: Secondary | ICD-10-CM | POA: Diagnosis not present

## 2016-03-29 DIAGNOSIS — D638 Anemia in other chronic diseases classified elsewhere: Secondary | ICD-10-CM | POA: Diagnosis not present

## 2016-03-29 DIAGNOSIS — M069 Rheumatoid arthritis, unspecified: Secondary | ICD-10-CM | POA: Diagnosis not present

## 2016-03-29 DIAGNOSIS — I509 Heart failure, unspecified: Secondary | ICD-10-CM | POA: Diagnosis not present

## 2016-03-29 DIAGNOSIS — G629 Polyneuropathy, unspecified: Secondary | ICD-10-CM | POA: Diagnosis not present

## 2016-03-29 DIAGNOSIS — N390 Urinary tract infection, site not specified: Secondary | ICD-10-CM | POA: Diagnosis not present

## 2016-03-29 DIAGNOSIS — M797 Fibromyalgia: Secondary | ICD-10-CM | POA: Diagnosis not present

## 2016-03-29 DIAGNOSIS — L89320 Pressure ulcer of left buttock, unstageable: Secondary | ICD-10-CM | POA: Diagnosis not present

## 2016-04-01 DIAGNOSIS — J449 Chronic obstructive pulmonary disease, unspecified: Secondary | ICD-10-CM | POA: Diagnosis not present

## 2016-04-01 DIAGNOSIS — L89320 Pressure ulcer of left buttock, unstageable: Secondary | ICD-10-CM | POA: Diagnosis not present

## 2016-04-01 DIAGNOSIS — D638 Anemia in other chronic diseases classified elsewhere: Secondary | ICD-10-CM | POA: Diagnosis not present

## 2016-04-01 DIAGNOSIS — I509 Heart failure, unspecified: Secondary | ICD-10-CM | POA: Diagnosis not present

## 2016-04-01 DIAGNOSIS — M069 Rheumatoid arthritis, unspecified: Secondary | ICD-10-CM | POA: Diagnosis not present

## 2016-04-01 DIAGNOSIS — M797 Fibromyalgia: Secondary | ICD-10-CM | POA: Diagnosis not present

## 2016-04-01 DIAGNOSIS — G629 Polyneuropathy, unspecified: Secondary | ICD-10-CM | POA: Diagnosis not present

## 2016-04-01 DIAGNOSIS — N390 Urinary tract infection, site not specified: Secondary | ICD-10-CM | POA: Diagnosis not present

## 2016-04-01 DIAGNOSIS — I110 Hypertensive heart disease with heart failure: Secondary | ICD-10-CM | POA: Diagnosis not present

## 2016-04-03 ENCOUNTER — Ambulatory Visit: Payer: Medicare HMO | Admitting: Surgery

## 2016-04-04 DIAGNOSIS — I509 Heart failure, unspecified: Secondary | ICD-10-CM | POA: Diagnosis not present

## 2016-04-04 DIAGNOSIS — J449 Chronic obstructive pulmonary disease, unspecified: Secondary | ICD-10-CM | POA: Diagnosis not present

## 2016-04-04 DIAGNOSIS — L89320 Pressure ulcer of left buttock, unstageable: Secondary | ICD-10-CM | POA: Diagnosis not present

## 2016-04-04 DIAGNOSIS — M797 Fibromyalgia: Secondary | ICD-10-CM | POA: Diagnosis not present

## 2016-04-04 DIAGNOSIS — G629 Polyneuropathy, unspecified: Secondary | ICD-10-CM | POA: Diagnosis not present

## 2016-04-04 DIAGNOSIS — M069 Rheumatoid arthritis, unspecified: Secondary | ICD-10-CM | POA: Diagnosis not present

## 2016-04-04 DIAGNOSIS — I110 Hypertensive heart disease with heart failure: Secondary | ICD-10-CM | POA: Diagnosis not present

## 2016-04-04 DIAGNOSIS — D638 Anemia in other chronic diseases classified elsewhere: Secondary | ICD-10-CM | POA: Diagnosis not present

## 2016-04-04 DIAGNOSIS — N390 Urinary tract infection, site not specified: Secondary | ICD-10-CM | POA: Diagnosis not present

## 2016-04-08 DIAGNOSIS — L89320 Pressure ulcer of left buttock, unstageable: Secondary | ICD-10-CM | POA: Diagnosis not present

## 2016-04-08 DIAGNOSIS — N390 Urinary tract infection, site not specified: Secondary | ICD-10-CM | POA: Diagnosis not present

## 2016-04-08 DIAGNOSIS — M797 Fibromyalgia: Secondary | ICD-10-CM | POA: Diagnosis not present

## 2016-04-08 DIAGNOSIS — M069 Rheumatoid arthritis, unspecified: Secondary | ICD-10-CM | POA: Diagnosis not present

## 2016-04-08 DIAGNOSIS — D638 Anemia in other chronic diseases classified elsewhere: Secondary | ICD-10-CM | POA: Diagnosis not present

## 2016-04-08 DIAGNOSIS — I509 Heart failure, unspecified: Secondary | ICD-10-CM | POA: Diagnosis not present

## 2016-04-08 DIAGNOSIS — J449 Chronic obstructive pulmonary disease, unspecified: Secondary | ICD-10-CM | POA: Diagnosis not present

## 2016-04-08 DIAGNOSIS — I110 Hypertensive heart disease with heart failure: Secondary | ICD-10-CM | POA: Diagnosis not present

## 2016-04-08 DIAGNOSIS — G629 Polyneuropathy, unspecified: Secondary | ICD-10-CM | POA: Diagnosis not present

## 2016-04-11 DIAGNOSIS — J449 Chronic obstructive pulmonary disease, unspecified: Secondary | ICD-10-CM | POA: Diagnosis not present

## 2016-04-11 DIAGNOSIS — L89320 Pressure ulcer of left buttock, unstageable: Secondary | ICD-10-CM | POA: Diagnosis not present

## 2016-04-11 DIAGNOSIS — I509 Heart failure, unspecified: Secondary | ICD-10-CM | POA: Diagnosis not present

## 2016-04-11 DIAGNOSIS — G629 Polyneuropathy, unspecified: Secondary | ICD-10-CM | POA: Diagnosis not present

## 2016-04-11 DIAGNOSIS — D638 Anemia in other chronic diseases classified elsewhere: Secondary | ICD-10-CM | POA: Diagnosis not present

## 2016-04-11 DIAGNOSIS — N390 Urinary tract infection, site not specified: Secondary | ICD-10-CM | POA: Diagnosis not present

## 2016-04-11 DIAGNOSIS — M797 Fibromyalgia: Secondary | ICD-10-CM | POA: Diagnosis not present

## 2016-04-11 DIAGNOSIS — M069 Rheumatoid arthritis, unspecified: Secondary | ICD-10-CM | POA: Diagnosis not present

## 2016-04-11 DIAGNOSIS — I110 Hypertensive heart disease with heart failure: Secondary | ICD-10-CM | POA: Diagnosis not present

## 2016-04-15 DIAGNOSIS — N390 Urinary tract infection, site not specified: Secondary | ICD-10-CM | POA: Diagnosis not present

## 2016-04-15 DIAGNOSIS — G629 Polyneuropathy, unspecified: Secondary | ICD-10-CM | POA: Diagnosis not present

## 2016-04-15 DIAGNOSIS — I110 Hypertensive heart disease with heart failure: Secondary | ICD-10-CM | POA: Diagnosis not present

## 2016-04-15 DIAGNOSIS — M797 Fibromyalgia: Secondary | ICD-10-CM | POA: Diagnosis not present

## 2016-04-15 DIAGNOSIS — L89320 Pressure ulcer of left buttock, unstageable: Secondary | ICD-10-CM | POA: Diagnosis not present

## 2016-04-15 DIAGNOSIS — D638 Anemia in other chronic diseases classified elsewhere: Secondary | ICD-10-CM | POA: Diagnosis not present

## 2016-04-15 DIAGNOSIS — J449 Chronic obstructive pulmonary disease, unspecified: Secondary | ICD-10-CM | POA: Diagnosis not present

## 2016-04-15 DIAGNOSIS — I509 Heart failure, unspecified: Secondary | ICD-10-CM | POA: Diagnosis not present

## 2016-04-15 DIAGNOSIS — M069 Rheumatoid arthritis, unspecified: Secondary | ICD-10-CM | POA: Diagnosis not present

## 2016-04-16 DIAGNOSIS — I5022 Chronic systolic (congestive) heart failure: Secondary | ICD-10-CM | POA: Diagnosis not present

## 2016-04-16 DIAGNOSIS — R0602 Shortness of breath: Secondary | ICD-10-CM | POA: Diagnosis not present

## 2016-04-17 DIAGNOSIS — L89320 Pressure ulcer of left buttock, unstageable: Secondary | ICD-10-CM | POA: Diagnosis not present

## 2016-04-17 DIAGNOSIS — M797 Fibromyalgia: Secondary | ICD-10-CM | POA: Diagnosis not present

## 2016-04-17 DIAGNOSIS — G629 Polyneuropathy, unspecified: Secondary | ICD-10-CM | POA: Diagnosis not present

## 2016-04-17 DIAGNOSIS — I509 Heart failure, unspecified: Secondary | ICD-10-CM | POA: Diagnosis not present

## 2016-04-17 DIAGNOSIS — I110 Hypertensive heart disease with heart failure: Secondary | ICD-10-CM | POA: Diagnosis not present

## 2016-04-17 DIAGNOSIS — M069 Rheumatoid arthritis, unspecified: Secondary | ICD-10-CM | POA: Diagnosis not present

## 2016-04-17 DIAGNOSIS — J449 Chronic obstructive pulmonary disease, unspecified: Secondary | ICD-10-CM | POA: Diagnosis not present

## 2016-04-17 DIAGNOSIS — N390 Urinary tract infection, site not specified: Secondary | ICD-10-CM | POA: Diagnosis not present

## 2016-04-17 DIAGNOSIS — D638 Anemia in other chronic diseases classified elsewhere: Secondary | ICD-10-CM | POA: Diagnosis not present

## 2016-04-22 DIAGNOSIS — I110 Hypertensive heart disease with heart failure: Secondary | ICD-10-CM | POA: Diagnosis not present

## 2016-04-22 DIAGNOSIS — D638 Anemia in other chronic diseases classified elsewhere: Secondary | ICD-10-CM | POA: Diagnosis not present

## 2016-04-22 DIAGNOSIS — M797 Fibromyalgia: Secondary | ICD-10-CM | POA: Diagnosis not present

## 2016-04-22 DIAGNOSIS — M069 Rheumatoid arthritis, unspecified: Secondary | ICD-10-CM | POA: Diagnosis not present

## 2016-04-22 DIAGNOSIS — I509 Heart failure, unspecified: Secondary | ICD-10-CM | POA: Diagnosis not present

## 2016-04-22 DIAGNOSIS — N390 Urinary tract infection, site not specified: Secondary | ICD-10-CM | POA: Diagnosis not present

## 2016-04-22 DIAGNOSIS — J449 Chronic obstructive pulmonary disease, unspecified: Secondary | ICD-10-CM | POA: Diagnosis not present

## 2016-04-22 DIAGNOSIS — L89320 Pressure ulcer of left buttock, unstageable: Secondary | ICD-10-CM | POA: Diagnosis not present

## 2016-04-22 DIAGNOSIS — G629 Polyneuropathy, unspecified: Secondary | ICD-10-CM | POA: Diagnosis not present

## 2016-04-25 DIAGNOSIS — I509 Heart failure, unspecified: Secondary | ICD-10-CM | POA: Diagnosis not present

## 2016-04-25 DIAGNOSIS — M069 Rheumatoid arthritis, unspecified: Secondary | ICD-10-CM | POA: Diagnosis not present

## 2016-04-25 DIAGNOSIS — L89320 Pressure ulcer of left buttock, unstageable: Secondary | ICD-10-CM | POA: Diagnosis not present

## 2016-04-25 DIAGNOSIS — I110 Hypertensive heart disease with heart failure: Secondary | ICD-10-CM | POA: Diagnosis not present

## 2016-04-25 DIAGNOSIS — N390 Urinary tract infection, site not specified: Secondary | ICD-10-CM | POA: Diagnosis not present

## 2016-04-25 DIAGNOSIS — M797 Fibromyalgia: Secondary | ICD-10-CM | POA: Diagnosis not present

## 2016-04-25 DIAGNOSIS — D638 Anemia in other chronic diseases classified elsewhere: Secondary | ICD-10-CM | POA: Diagnosis not present

## 2016-04-25 DIAGNOSIS — G629 Polyneuropathy, unspecified: Secondary | ICD-10-CM | POA: Diagnosis not present

## 2016-04-25 DIAGNOSIS — J449 Chronic obstructive pulmonary disease, unspecified: Secondary | ICD-10-CM | POA: Diagnosis not present

## 2016-04-28 DIAGNOSIS — N390 Urinary tract infection, site not specified: Secondary | ICD-10-CM | POA: Diagnosis not present

## 2016-04-28 DIAGNOSIS — L89320 Pressure ulcer of left buttock, unstageable: Secondary | ICD-10-CM | POA: Diagnosis not present

## 2016-04-28 DIAGNOSIS — M797 Fibromyalgia: Secondary | ICD-10-CM | POA: Diagnosis not present

## 2016-04-28 DIAGNOSIS — I110 Hypertensive heart disease with heart failure: Secondary | ICD-10-CM | POA: Diagnosis not present

## 2016-04-28 DIAGNOSIS — I509 Heart failure, unspecified: Secondary | ICD-10-CM | POA: Diagnosis not present

## 2016-04-28 DIAGNOSIS — D638 Anemia in other chronic diseases classified elsewhere: Secondary | ICD-10-CM | POA: Diagnosis not present

## 2016-04-28 DIAGNOSIS — M069 Rheumatoid arthritis, unspecified: Secondary | ICD-10-CM | POA: Diagnosis not present

## 2016-04-28 DIAGNOSIS — J449 Chronic obstructive pulmonary disease, unspecified: Secondary | ICD-10-CM | POA: Diagnosis not present

## 2016-04-28 DIAGNOSIS — G629 Polyneuropathy, unspecified: Secondary | ICD-10-CM | POA: Diagnosis not present

## 2016-04-29 ENCOUNTER — Encounter (INDEPENDENT_AMBULATORY_CARE_PROVIDER_SITE_OTHER): Payer: Self-pay | Admitting: Vascular Surgery

## 2016-04-29 ENCOUNTER — Ambulatory Visit (INDEPENDENT_AMBULATORY_CARE_PROVIDER_SITE_OTHER): Payer: Medicare HMO | Admitting: Vascular Surgery

## 2016-04-29 VITALS — BP 127/72 | HR 89 | Resp 16 | Wt 113.0 lb

## 2016-04-29 DIAGNOSIS — M79604 Pain in right leg: Secondary | ICD-10-CM

## 2016-04-29 DIAGNOSIS — Z87891 Personal history of nicotine dependence: Secondary | ICD-10-CM

## 2016-04-29 DIAGNOSIS — M79609 Pain in unspecified limb: Secondary | ICD-10-CM | POA: Insufficient documentation

## 2016-04-29 DIAGNOSIS — I712 Thoracic aortic aneurysm, without rupture, unspecified: Secondary | ICD-10-CM | POA: Insufficient documentation

## 2016-04-29 DIAGNOSIS — J449 Chronic obstructive pulmonary disease, unspecified: Secondary | ICD-10-CM

## 2016-04-29 NOTE — Assessment & Plan Note (Signed)
Stable last check. To be checked later this year

## 2016-04-29 NOTE — Assessment & Plan Note (Signed)
The patient has lower extremity symptoms that are unclear and their etiology. With the acute pain and swelling in a debilitated patient with recent hospitalization, we will be checking for a DVT in the near future. That can certainly be causing symptoms. She has an extensive tobacco and atherosclerotic history, and atherosclerotic occlusive disease could also be contributing. With her sacral decubitus and back problems, neuropathic pain is also likely. We will be checking her arterial and venous studies in the near future at her convenience, and we will see her back following these studies to discuss the results and determine further treatment options. Continue aspirin and statin agent.

## 2016-04-29 NOTE — Progress Notes (Signed)
MRN : [id number]  [name] [name] is a 76 y.o. (04/30/1940) female who presents with chief complaint of  Chief Complaint  Patient presents with  . Follow-up  .  History of Present Illness: Patient returns today with new complaints. Over the past week or so her right calf and lower leg have been hurting. She does notice swelling. Pain is waking her at night. The leg is very tender to the touch and this is all new. She was seen about 8 months ago for her thoracoabdominal aneurysm which was stable. Since that time, she has been admitted for urosepsis with a prolonged hospitalization including a sacral decubitus ulceration. She continues to smoke. She does not have fever or chills. She has no chest pain or shortness of breath. The left leg is not currently bothering her. The swelling is most severe in the evening. There was no clear inciting event or causative factor that started her symptoms.  Current Outpatient Prescriptions  Medication Sig Dispense Refill  . Calcium-Vitamin D 600-200 MG-UNIT tablet Take by mouth.    . carvedilol (COREG) 12.5 MG tablet Take 1 tablet (12.5 mg total) by mouth 2 (two) times daily with a meal. 60 tablet 0  . clonazePAM (KLONOPIN) 1 MG tablet Take 1 mg by mouth 2 (two) times daily as needed for anxiety.     . feeding supplement, ENSURE ENLIVE, (ENSURE ENLIVE) LIQD Take 237 mLs by mouth daily at 3 pm. 237 mL 12  . gabapentin (NEURONTIN) 600 MG tablet Take 600 mg by mouth 3 (three) times daily.    . Ipratropium-Albuterol (COMBIVENT IN) Inhale into the lungs.    . nitroGLYCERIN (NITROSTAT) 0.4 MG SL tablet Place under the tongue.    . promethazine (PHENERGAN) 12.5 MG tablet Take 12.5 mg by mouth.    . traMADol (ULTRAM) 50 MG tablet Take 100 mg by mouth every 8 (eight) hours.     [name] zolpidem (AMBIEN) 5 MG tablet Take 5 mg by mouth at bedtime.    [name] aspirin 325 MG EC tablet Take 1 tablet (325 mg total) by mouth daily. (Patient not taking: Reported on 12/04/2015) 30  tablet 0  . atorvastatin (LIPITOR) 40 MG tablet Take 1 tablet (40 mg total) by mouth daily at 6 PM. (Patient not taking: Reported on 03/04/2016) 30 tablet 0  . carvedilol (COREG) 12.5 MG tablet Take by mouth.    . clonazePAM (KLONOPIN) 1 MG tablet Take by mouth.    . ertapenem 1 g in sodium chloride 0.9 % 50 mL Inject 1 g into the vein daily. (Patient not taking: Reported on 04/29/2016) 6 Dose 0  . gabapentin (NEURONTIN) 600 MG tablet Take by mouth.    [name] imipramine (TOFRANIL) 25 MG tablet Take 1 tablet by mouth at bedtime.     . iron polysaccharides (FERREX 150) 150 MG capsule Take 150 mg by mouth daily.    [name] lisinopril (PRINIVIL,ZESTRIL) 2.5 MG tablet Take 2.5 mg by mouth daily.     No current facility-administered medications for this visit.     Past Medical History:  Diagnosis Date  . Asthma   . Breast cancer ([place]) 2009   left  . Cancer ([place])   . CHF (congestive heart failure) ([place])   . CHF (congestive heart failure) ([place])   . Chronic UTI   . COPD (chronic obstructive pulmonary disease) ([place])   . Dizziness   . Fibromyalgia   . Hypertension   . Neuropathy   . Personal history of  tobacco use, presenting hazards to health 05/17/2015  . Polyp, larynx   . RA (rheumatoid arthritis) ([place])   . Sinus infection    recent  . Stumbling gait    to the left  . Supplemental oxygen dependent    2.5l    Past Surgical History:  Procedure Laterality Date  . ABDOMINAL HYSTERECTOMY    . BREAST LUMPECTOMY Left 2009   chemo and radiation  . CYST EXCISION Left 02/27/2015   Procedure: CYST REMOVAL;  Surgeon: Hessie Knows, MD;  Location: [facility] ORS;  Service: Orthopedics;  Laterality: Left;  . EYE MUSCLE SURGERY Right    13 surgeries  . THUMB ARTHROSCOPY Left     Social History Social History  Substance Use Topics  . Smoking status: Current Every Day Smoker    Packs/day: 1.00    Years: 40.00    Types: Cigarettes  . Smokeless tobacco: Never Used  . Alcohol use No  No IV drug use    Family History Family History  Problem Relation Age of Onset  . Diabetes Father   . Stroke Father   . Heart attack Father   . CAD Sister    No bleeding or clotting disorders   Allergies  Allergen Reactions  . Contrast Media [Iodinated Diagnostic Agents] Other (See Comments)    Pt was sent to the ED following contrast media injection at [place]. Unknown reason. She has been premedicated since without complications. Pt to be premedicated prior to contrast media injections  . Ioxaglate Other (See Comments)    Pt was sent to the ED following contrast media injection at [place]. Unknown reason. She has been premedicated since without complications. Pt to be premedicated prior to contrast media injections  . Sulfa Antibiotics     Other reaction(s): Other (See Comments)  . Tetracycline Hives and Other (See Comments)  . White Petrolatum Other (See Comments)  . Amoxicillin-Pot Clavulanate Rash    Blisters in mouth Other reaction(s): Unknown Blisters in mouth Blisters in mouth  . Tape Rash     REVIEW OF SYSTEMS (Negative unless checked)  Constitutional: '[]' Weight loss  '[]' Fever  '[]' Chills Cardiac: '[]' Chest pain   '[]' Chest pressure   '[]' Palpitations   '[]' Shortness of breath when laying flat   '[]' Shortness of breath at rest   '[x]' Shortness of breath with exertion. Vascular:  '[x]' Pain in legs with walking   '[x]' Pain in legs at rest   '[]' Pain in legs when laying flat   '[]' Claudication   '[]' Pain in feet when walking  '[x]' Pain in feet at rest  '[]' Pain in feet when laying flat   '[]' History of DVT   '[]' Phlebitis   '[x]' Swelling in legs   '[]' Varicose veins   '[x]' Non-healing ulcers Pulmonary:   '[]' Uses home oxygen   '[]' Productive cough   '[]' Hemoptysis   '[]' Wheeze  '[]' COPD   '[]' Asthma Neurologic:  '[]' Dizziness  '[]' Blackouts   '[]' Seizures   '[]' History of stroke   '[]' History of TIA  '[]' Aphasia   '[]' Temporary blindness   '[]' Dysphagia   '[]' Weakness or numbness in arms   '[]' Weakness or numbness in  legs Musculoskeletal:  '[]' Arthritis   '[]' Joint swelling   '[]' Joint pain   '[]' Low back pain Hematologic:  '[]' Easy bruising  '[]' Easy bleeding   '[]' Hypercoagulable state   '[]' Anemic   Gastrointestinal:  '[]' Blood in stool   '[]' Vomiting blood  '[]' Gastroesophageal reflux/heartburn   '[]' Abdominal pain Genitourinary:  '[]' Chronic kidney disease   '[]' Difficult urination  '[]' Frequent urination  '[x]' Burning with urination   '[]' Hematuria Skin:  '[]' Rashes   [  x]Ulcers   '[x]' Wounds Psychological:  '[]' History of anxiety   '[]'  History of major depression.  Physical Examination  BP 127/72   Pulse 89   Resp 16   Wt 113 lb (51.3 kg)   BMI 22.07 kg/m  Gen:  WD/WN, NAD Head: [place]/AT, No temporalis wasting. Ear/Nose/Throat: Hearing grossly intact, nares w/o erythema or drainage, trachea midline Eyes: Conjunctiva clear. Sclera non-icteric Neck: Supple.  No JVD.  Pulmonary:  Good air movement, no use of accessory muscles.  Cardiac: RRR, normal S1, S2 Vascular:  Vessel Right Left  Radial Palpable Palpable                      Popliteal Not Palpable 1+ Palpable  PT Not Palpable 1+ Palpable  DP 1+ Palpable 1+ Palpable   Gastrointestinal: soft, non-tender/non-distended. No guarding/reflex.  Musculoskeletal: M/S 5/5 throughout.  No deformity or atrophy. 1+ right lower extremity edema. Neurologic: Sensation grossly intact in extremities.  Symmetrical.  Speech is fluent.  Psychiatric: Judgment intact, Mood & affect appropriate for pt's clinical situation. Dermatologic: No rashes or ulcers noted.  No cellulitis or open wounds. Lymph : No Cervical, Axillary, or Inguinal lymphadenopathy.      Labs Recent Results (from the past 2160 hour(s))  Comprehensive metabolic panel     Status: Abnormal   Collection Time: 03/04/16  2:20 PM  Result Value Ref Range   Sodium 128 (L) 135 - 145 mmol/L   Potassium 4.2 3.5 - 5.1 mmol/L   Chloride 92 (L) 101 - 111 mmol/L   CO2 31 22 - 32 mmol/L   Glucose, Bld 95 65 - 99 mg/dL   BUN 13 6 -  20 mg/dL   Creatinine, Ser 1.11 (H) 0.44 - 1.00 mg/dL   Calcium 8.5 (L) 8.9 - 10.3 mg/dL   Total Protein 6.8 6.5 - 8.1 g/dL   Albumin 3.2 (L) 3.5 - 5.0 g/dL   AST 20 15 - 41 U/L   ALT 7 (L) 14 - 54 U/L   Alkaline Phosphatase 92 38 - 126 U/L   Total Bilirubin 0.2 (L) 0.3 - 1.2 mg/dL   GFR calc non Af Amer 48 (L) >60 mL/min   GFR calc Af Amer 56 (L) >60 mL/min    Comment: (NOTE) The eGFR has been calculated using the CKD EPI equation. This calculation has not been validated in all clinical situations. eGFR's persistently <60 mL/min signify possible Chronic Kidney Disease.    Anion gap 5 5 - 15  CBC     Status: Abnormal   Collection Time: 03/04/16  2:20 PM  Result Value Ref Range   WBC 5.9 3.6 - 11.0 K/uL   RBC 3.28 (L) 3.80 - 5.20 MIL/uL   Hemoglobin 9.9 (L) 12.0 - 16.0 g/dL   HCT 29.2 (L) 35.0 - 47.0 %   MCV 89.0 80.0 - 100.0 fL   MCH 30.1 26.0 - 34.0 pg   MCHC 33.8 32.0 - 36.0 g/dL   RDW 16.3 (H) 11.5 - 14.5 %   Platelets 250 150 - 440 K/uL  Urinalysis, Complete w Microscopic     Status: Abnormal   Collection Time: 03/04/16  2:23 PM  Result Value Ref Range   Color, Urine YELLOW (A) YELLOW   APPearance CLEAR (A) CLEAR   Specific Gravity, Urine 1.006 1.005 - 1.030   pH 6.0 5.0 - 8.0   Glucose, UA NEGATIVE NEGATIVE mg/dL   Hgb urine dipstick NEGATIVE NEGATIVE   Bilirubin Urine NEGATIVE NEGATIVE  Ketones, ur NEGATIVE NEGATIVE mg/dL   Protein, ur NEGATIVE NEGATIVE mg/dL   Nitrite POSITIVE (A) NEGATIVE   Leukocytes, UA SMALL (A) NEGATIVE   RBC / HPF 0-5 0 - 5 RBC/hpf   WBC, UA 6-30 0 - 5 WBC/hpf   Bacteria, UA MANY (A) NONE SEEN   Squamous Epithelial / LPF 0-5 (A) NONE SEEN   Mucous PRESENT   Urine culture     Status: Abnormal   Collection Time: 03/04/16  2:23 PM  Result Value Ref Range   Specimen Description URINE, RANDOM    Special Requests NONE    Culture (A)     80,000 COLONIES/mL ESCHERICHIA COLI Confirmed Extended Spectrum Beta-Lactamase Producer  (ESBL) Performed at [facility], [place] [address] [place], [place] [address]    Report Status 03/06/2016 FINAL    Organism ID, Bacteria ESCHERICHIA COLI (A)       Susceptibility   Escherichia coli - MIC*    AMPICILLIN >=32 RESISTANT Resistant     CEFAZOLIN >=64 RESISTANT Resistant     CEFTRIAXONE >=64 RESISTANT Resistant     CIPROFLOXACIN >=4 RESISTANT Resistant     GENTAMICIN <=1 SENSITIVE Sensitive     IMIPENEM <=0.25 SENSITIVE Sensitive     NITROFURANTOIN 256 RESISTANT Resistant     TRIMETH/SULFA >=320 RESISTANT Resistant     AMPICILLIN/SULBACTAM >=32 RESISTANT Resistant     PIP/TAZO 16 SENSITIVE Sensitive     Extended ESBL POSITIVE Resistant     * 80,000 COLONIES/mL ESCHERICHIA COLI  CBC     Status: Abnormal   Collection Time: 03/05/16  4:16 AM  Result Value Ref Range   WBC 4.6 3.6 - 11.0 K/uL   RBC 3.30 (L) 3.80 - 5.20 MIL/uL   Hemoglobin 9.9 (L) 12.0 - 16.0 g/dL   HCT 29.2 (L) 35.0 - 47.0 %   MCV 88.6 80.0 - 100.0 fL   MCH 30.0 26.0 - 34.0 pg   MCHC 33.8 32.0 - 36.0 g/dL   RDW 16.5 (H) 11.5 - 14.5 %   Platelets 251 150 - 440 K/uL  Basic metabolic panel     Status: Abnormal   Collection Time: 03/05/16  4:16 AM  Result Value Ref Range   Sodium 136 135 - 145 mmol/L   Potassium 4.2 3.5 - 5.1 mmol/L   Chloride 102 101 - 111 mmol/L   CO2 28 22 - 32 mmol/L   Glucose, Bld 94 65 - 99 mg/dL   BUN 10 6 - 20 mg/dL   Creatinine, Ser 1.02 (H) 0.44 - 1.00 mg/dL   Calcium 8.3 (L) 8.9 - 10.3 mg/dL   GFR calc non Af Amer 53 (L) >60 mL/min   GFR calc Af Amer >60 >60 mL/min    Comment: (NOTE) The eGFR has been calculated using the CKD EPI equation. This calculation has not been validated in all clinical situations. eGFR's persistently <60 mL/min signify possible Chronic Kidney Disease.    Anion gap 6 5 - 15    Radiology No results found.    Assessment/Plan  Personal history of tobacco use, presenting hazards to health With COPD and a thoracic aneurysm as results  from her smoking, yet she continues  Aneurysm of thoracic aorta ([place]) Stable last check. To be checked later this year  Pain in limb The patient has lower extremity symptoms that are unclear and their etiology. With the acute pain and swelling in a debilitated patient with recent hospitalization, we will be checking for a DVT in the near  future. That can certainly be causing symptoms. She has an extensive tobacco and atherosclerotic history, and atherosclerotic occlusive disease could also be contributing. With her sacral decubitus and back problems, neuropathic pain is also likely. We will be checking her arterial and venous studies in the near future at her convenience, and we will see her back following these studies to discuss the results and determine further treatment options. Continue aspirin and statin agent.    Leotis Pain, MD  04/29/2016 5:22 PM    This note was created with Dragon medical transcription system.  Any errors from dictation are purely unintentional

## 2016-04-29 NOTE — Assessment & Plan Note (Signed)
With COPD and a thoracic aneurysm as results from her smoking, yet she continues

## 2016-04-30 ENCOUNTER — Ambulatory Visit (INDEPENDENT_AMBULATORY_CARE_PROVIDER_SITE_OTHER): Payer: Medicare HMO

## 2016-04-30 ENCOUNTER — Ambulatory Visit (INDEPENDENT_AMBULATORY_CARE_PROVIDER_SITE_OTHER): Payer: Medicare HMO | Admitting: Vascular Surgery

## 2016-04-30 ENCOUNTER — Encounter (INDEPENDENT_AMBULATORY_CARE_PROVIDER_SITE_OTHER): Payer: Self-pay | Admitting: Vascular Surgery

## 2016-04-30 VITALS — BP 139/71 | HR 67 | Resp 15 | Ht 62.0 in | Wt 110.0 lb

## 2016-04-30 DIAGNOSIS — M79604 Pain in right leg: Secondary | ICD-10-CM

## 2016-04-30 DIAGNOSIS — I5022 Chronic systolic (congestive) heart failure: Secondary | ICD-10-CM

## 2016-04-30 DIAGNOSIS — Z87891 Personal history of nicotine dependence: Secondary | ICD-10-CM

## 2016-04-30 NOTE — Progress Notes (Signed)
Subjective:    Patient ID: [name] [name], female    DOB: February 16, 1940, 76 y.o.   MRN: [id number] Chief Complaint  Patient presents with  . Re-evaluation    ASAP DVT study   Patient last seen yesterday. She presents today to review vascular studies. Symptoms are stable. The patient underwent a venous duplex of the right lower extremity which was negative for DVT and SVT. She has a ABI ordered next to assess for PAD given her risk factors. She denies any fever, nausea or vomiting.    Review of Systems  Constitutional: Negative.   HENT: Negative.   Eyes: Negative.   Respiratory: Negative.   Cardiovascular: Positive for leg swelling.       Right lower extremity pain  Gastrointestinal: Negative.   Endocrine: Negative.   Genitourinary: Negative.   Musculoskeletal: Negative.   Skin: Negative.   Allergic/Immunologic: Negative.   Neurological: Negative.   Hematological: Negative.   Psychiatric/Behavioral: Negative.       Objective:   Physical Exam  Vitals reviewed.  Please note physical exam is same as yesterday.  BP 139/71 (BP Location: Right Arm)   Pulse 67   Resp 15   Ht '5\' 2"'$  (1.575 m)   Wt 110 lb (49.9 kg)   BMI 20.12 kg/m    Gen:  WD/WN, NAD Head: [place]/AT, No temporalis wasting. Ear/Nose/Throat: Hearing grossly intact, nares w/o erythema or drainage, trachea midline Eyes: Conjunctiva clear. Sclera non-icteric Neck: Supple.  No JVD.  Pulmonary:  Good air movement, no use of accessory muscles.  Cardiac: RRR, normal S1, S2 Vascular:  Vessel Right Left  Radial Palpable Palpable                      Popliteal Not Palpable 1+ Palpable  PT Not Palpable 1+ Palpable  DP 1+ Palpable 1+ Palpable   Gastrointestinal: soft, non-tender/non-distended. No guarding/reflex.  Musculoskeletal: M/S 5/5 throughout.  No deformity or atrophy. 1+ right lower extremity edema. Neurologic: Sensation grossly intact in extremities.  Symmetrical.  Speech is fluent.    Psychiatric: Judgment intact, Mood & affect appropriate for pt's clinical situation. Dermatologic: No rashes or ulcers noted.  No cellulitis or open wounds. Lymph : No Cervical, Axillary, or Inguinal lymphadenopathy.  Past Medical History:  Diagnosis Date  . Asthma   . Breast cancer ([place]) 2009   left  . Cancer ([place])   . CHF (congestive heart failure) ([place])   . CHF (congestive heart failure) ([place])   . Chronic UTI   . COPD (chronic obstructive pulmonary disease) ([place])   . Dizziness   . Fibromyalgia   . Hypertension   . Neuropathy   . Personal history of tobacco use, presenting hazards to health 05/17/2015  . Polyp, larynx   . RA (rheumatoid arthritis) ([place])   . Sinus infection    recent  . Stumbling gait    to the left  . Supplemental oxygen dependent    2.5l   Social History   Social History  . Marital status: Divorced    Spouse name: N/A  . Number of children: N/A  . Years of education: N/A   Occupational History  . Not on file.   Social History Main Topics  . Smoking status: Current Every Day Smoker    Packs/day: 1.00    Years: 40.00    Types: Cigarettes  . Smokeless tobacco: Never Used  . Alcohol use No  . Drug use: No  . Sexual activity: Not on  file   Other Topics Concern  . Not on file   Social History Narrative  . No narrative on file   Past Surgical History:  Procedure Laterality Date  . ABDOMINAL HYSTERECTOMY    . BREAST LUMPECTOMY Left 2009   chemo and radiation  . CYST EXCISION Left 02/27/2015   Procedure: CYST REMOVAL;  Surgeon: Hessie Knows, MD;  Location: [facility] ORS;  Service: Orthopedics;  Laterality: Left;  . EYE MUSCLE SURGERY Right    13 surgeries  . THUMB ARTHROSCOPY Left    Family History  Problem Relation Age of Onset  . Diabetes Father   . Stroke Father   . Heart attack Father   . CAD Sister    Allergies  Allergen Reactions  . Contrast Media [Iodinated Diagnostic Agents] Other (See Comments)    Pt was sent to the ED  following contrast media injection at [place]. Unknown reason. She has been premedicated since without complications. Pt to be premedicated prior to contrast media injections  . Ioxaglate Other (See Comments)    Pt was sent to the ED following contrast media injection at [place]. Unknown reason. She has been premedicated since without complications. Pt to be premedicated prior to contrast media injections  . Sulfa Antibiotics     Other reaction(s): Other (See Comments)  . Tetracycline Hives and Other (See Comments)  . White Petrolatum Other (See Comments)  . Amoxicillin-Pot Clavulanate Rash    Other reaction(s): Unknown Other reaction(s): Unknown Blisters in mouth Blisters in mouth Blisters in mouth Blisters in mouth Blisters in mouth Other reaction(s): Unknown Blisters in mouth Blisters in mouth  . Tape Rash      Assessment & Plan:  Patient last seen yesterday. She presents today to review vascular studies. Symptoms are stable. The patient underwent a venous duplex of the right lower extremity which was negative for DVT and SVT. She has a ABI ordered next to assess for PAD given her risk factors. She denies any fever, nausea or vomiting.   1. Chronic systolic CHF (congestive heart failure) (HCC) - Stable Can be contributing to patients lower extremity edema. Patient to engage in elevation.   2. Personal history of tobacco use, presenting hazards to health - Stable I have discussed (approximately 5 minutes) with the patient the role of tobacco in the pathogenesis of atherosclerosis and its effect on the progression of the disease, impact on the durability of interventions and its limitations on the formation of collateral pathways. I have recommended absolute tobacco cessation. I have discussed various options available for assistance with tobacco cessation including over the counter methods (Nicotine gum, patch and lozenges). We also discussed prescription options  (Chantix, Nicotine Inhaler / Nasal Spray). The patient is not interested in pursuing any prescription tobacco cessation options at this time. The patient voices their understanding.   3. Pain of right lower extremity - Stable DVT ruled out. Will bring patient back for ABI to rule out any contributing PAD.  Current Outpatient Prescriptions on File Prior to Visit  Medication Sig Dispense Refill  . aspirin 325 MG EC tablet Take 1 tablet (325 mg total) by mouth daily. (Patient not taking: Reported on 12/04/2015) 30 tablet 0  . atorvastatin (LIPITOR) 40 MG tablet Take 1 tablet (40 mg total) by mouth daily at 6 PM. (Patient not taking: Reported on 03/04/2016) 30 tablet 0  . Calcium-Vitamin D 600-200 MG-UNIT tablet Take by mouth.    . carvedilol (COREG) 12.5 MG tablet Take 1 tablet (12.5  mg total) by mouth 2 (two) times daily with a meal. 60 tablet 0  . carvedilol (COREG) 12.5 MG tablet Take by mouth.    . clonazePAM (KLONOPIN) 1 MG tablet Take 1 mg by mouth 2 (two) times daily as needed for anxiety.     . clonazePAM (KLONOPIN) 1 MG tablet Take by mouth.    . ertapenem 1 g in sodium chloride 0.9 % 50 mL Inject 1 g into the vein daily. (Patient not taking: Reported on 04/29/2016) 6 Dose 0  . feeding supplement, ENSURE ENLIVE, (ENSURE ENLIVE) LIQD Take 237 mLs by mouth daily at 3 pm. 237 mL 12  . gabapentin (NEURONTIN) 600 MG tablet Take 600 mg by mouth 3 (three) times daily.    [name] gabapentin (NEURONTIN) 600 MG tablet Take by mouth.    [name] imipramine (TOFRANIL) 25 MG tablet Take 1 tablet by mouth at bedtime.     . Ipratropium-Albuterol (COMBIVENT IN) Inhale into the lungs.    . iron polysaccharides (FERREX 150) 150 MG capsule Take 150 mg by mouth daily.    [name] lisinopril (PRINIVIL,ZESTRIL) 2.5 MG tablet Take 2.5 mg by mouth daily.    . nitroGLYCERIN (NITROSTAT) 0.4 MG SL tablet Place under the tongue.    . promethazine (PHENERGAN) 12.5 MG tablet Take 12.5 mg by mouth.    . traMADol (ULTRAM) 50 MG tablet  Take 100 mg by mouth every 8 (eight) hours.     [name] zolpidem (AMBIEN) 5 MG tablet Take 5 mg by mouth at bedtime.     No current facility-administered medications on file prior to visit.     There are no Patient Instructions on file for this visit. No Follow-up on file.   Fareed Fung A Naija Troost, PA-C

## 2016-05-01 DIAGNOSIS — I509 Heart failure, unspecified: Secondary | ICD-10-CM | POA: Diagnosis not present

## 2016-05-01 DIAGNOSIS — M797 Fibromyalgia: Secondary | ICD-10-CM | POA: Diagnosis not present

## 2016-05-01 DIAGNOSIS — M069 Rheumatoid arthritis, unspecified: Secondary | ICD-10-CM | POA: Diagnosis not present

## 2016-05-01 DIAGNOSIS — L89320 Pressure ulcer of left buttock, unstageable: Secondary | ICD-10-CM | POA: Diagnosis not present

## 2016-05-01 DIAGNOSIS — N390 Urinary tract infection, site not specified: Secondary | ICD-10-CM | POA: Diagnosis not present

## 2016-05-01 DIAGNOSIS — I110 Hypertensive heart disease with heart failure: Secondary | ICD-10-CM | POA: Diagnosis not present

## 2016-05-01 DIAGNOSIS — G629 Polyneuropathy, unspecified: Secondary | ICD-10-CM | POA: Diagnosis not present

## 2016-05-01 DIAGNOSIS — D638 Anemia in other chronic diseases classified elsewhere: Secondary | ICD-10-CM | POA: Diagnosis not present

## 2016-05-01 DIAGNOSIS — J449 Chronic obstructive pulmonary disease, unspecified: Secondary | ICD-10-CM | POA: Diagnosis not present

## 2016-05-05 DIAGNOSIS — J449 Chronic obstructive pulmonary disease, unspecified: Secondary | ICD-10-CM | POA: Diagnosis not present

## 2016-05-05 DIAGNOSIS — G629 Polyneuropathy, unspecified: Secondary | ICD-10-CM | POA: Diagnosis not present

## 2016-05-05 DIAGNOSIS — I509 Heart failure, unspecified: Secondary | ICD-10-CM | POA: Diagnosis not present

## 2016-05-05 DIAGNOSIS — I110 Hypertensive heart disease with heart failure: Secondary | ICD-10-CM | POA: Diagnosis not present

## 2016-05-05 DIAGNOSIS — L89320 Pressure ulcer of left buttock, unstageable: Secondary | ICD-10-CM | POA: Diagnosis not present

## 2016-05-05 DIAGNOSIS — D638 Anemia in other chronic diseases classified elsewhere: Secondary | ICD-10-CM | POA: Diagnosis not present

## 2016-05-05 DIAGNOSIS — N390 Urinary tract infection, site not specified: Secondary | ICD-10-CM | POA: Diagnosis not present

## 2016-05-05 DIAGNOSIS — M797 Fibromyalgia: Secondary | ICD-10-CM | POA: Diagnosis not present

## 2016-05-05 DIAGNOSIS — M069 Rheumatoid arthritis, unspecified: Secondary | ICD-10-CM | POA: Diagnosis not present

## 2016-05-08 DIAGNOSIS — G629 Polyneuropathy, unspecified: Secondary | ICD-10-CM | POA: Diagnosis not present

## 2016-05-08 DIAGNOSIS — M069 Rheumatoid arthritis, unspecified: Secondary | ICD-10-CM | POA: Diagnosis not present

## 2016-05-08 DIAGNOSIS — I110 Hypertensive heart disease with heart failure: Secondary | ICD-10-CM | POA: Diagnosis not present

## 2016-05-08 DIAGNOSIS — D638 Anemia in other chronic diseases classified elsewhere: Secondary | ICD-10-CM | POA: Diagnosis not present

## 2016-05-08 DIAGNOSIS — N390 Urinary tract infection, site not specified: Secondary | ICD-10-CM | POA: Diagnosis not present

## 2016-05-08 DIAGNOSIS — I509 Heart failure, unspecified: Secondary | ICD-10-CM | POA: Diagnosis not present

## 2016-05-08 DIAGNOSIS — L89320 Pressure ulcer of left buttock, unstageable: Secondary | ICD-10-CM | POA: Diagnosis not present

## 2016-05-08 DIAGNOSIS — J449 Chronic obstructive pulmonary disease, unspecified: Secondary | ICD-10-CM | POA: Diagnosis not present

## 2016-05-08 DIAGNOSIS — M797 Fibromyalgia: Secondary | ICD-10-CM | POA: Diagnosis not present

## 2016-05-13 DIAGNOSIS — I110 Hypertensive heart disease with heart failure: Secondary | ICD-10-CM | POA: Diagnosis not present

## 2016-05-13 DIAGNOSIS — L89320 Pressure ulcer of left buttock, unstageable: Secondary | ICD-10-CM | POA: Diagnosis not present

## 2016-05-13 DIAGNOSIS — N390 Urinary tract infection, site not specified: Secondary | ICD-10-CM | POA: Diagnosis not present

## 2016-05-13 DIAGNOSIS — M069 Rheumatoid arthritis, unspecified: Secondary | ICD-10-CM | POA: Diagnosis not present

## 2016-05-13 DIAGNOSIS — I509 Heart failure, unspecified: Secondary | ICD-10-CM | POA: Diagnosis not present

## 2016-05-13 DIAGNOSIS — G629 Polyneuropathy, unspecified: Secondary | ICD-10-CM | POA: Diagnosis not present

## 2016-05-13 DIAGNOSIS — M797 Fibromyalgia: Secondary | ICD-10-CM | POA: Diagnosis not present

## 2016-05-13 DIAGNOSIS — D638 Anemia in other chronic diseases classified elsewhere: Secondary | ICD-10-CM | POA: Diagnosis not present

## 2016-05-13 DIAGNOSIS — J449 Chronic obstructive pulmonary disease, unspecified: Secondary | ICD-10-CM | POA: Diagnosis not present

## 2016-05-15 DIAGNOSIS — I110 Hypertensive heart disease with heart failure: Secondary | ICD-10-CM | POA: Diagnosis not present

## 2016-05-15 DIAGNOSIS — N390 Urinary tract infection, site not specified: Secondary | ICD-10-CM | POA: Diagnosis not present

## 2016-05-15 DIAGNOSIS — I509 Heart failure, unspecified: Secondary | ICD-10-CM | POA: Diagnosis not present

## 2016-05-15 DIAGNOSIS — L89320 Pressure ulcer of left buttock, unstageable: Secondary | ICD-10-CM | POA: Diagnosis not present

## 2016-05-15 DIAGNOSIS — G629 Polyneuropathy, unspecified: Secondary | ICD-10-CM | POA: Diagnosis not present

## 2016-05-15 DIAGNOSIS — J449 Chronic obstructive pulmonary disease, unspecified: Secondary | ICD-10-CM | POA: Diagnosis not present

## 2016-05-15 DIAGNOSIS — M069 Rheumatoid arthritis, unspecified: Secondary | ICD-10-CM | POA: Diagnosis not present

## 2016-05-15 DIAGNOSIS — M797 Fibromyalgia: Secondary | ICD-10-CM | POA: Diagnosis not present

## 2016-05-15 DIAGNOSIS — D638 Anemia in other chronic diseases classified elsewhere: Secondary | ICD-10-CM | POA: Diagnosis not present

## 2016-05-16 DIAGNOSIS — R0602 Shortness of breath: Secondary | ICD-10-CM | POA: Diagnosis not present

## 2016-05-16 DIAGNOSIS — I5022 Chronic systolic (congestive) heart failure: Secondary | ICD-10-CM | POA: Diagnosis not present

## 2016-05-20 DIAGNOSIS — D638 Anemia in other chronic diseases classified elsewhere: Secondary | ICD-10-CM | POA: Diagnosis not present

## 2016-05-20 DIAGNOSIS — J449 Chronic obstructive pulmonary disease, unspecified: Secondary | ICD-10-CM | POA: Diagnosis not present

## 2016-05-20 DIAGNOSIS — G629 Polyneuropathy, unspecified: Secondary | ICD-10-CM | POA: Diagnosis not present

## 2016-05-20 DIAGNOSIS — M797 Fibromyalgia: Secondary | ICD-10-CM | POA: Diagnosis not present

## 2016-05-20 DIAGNOSIS — M069 Rheumatoid arthritis, unspecified: Secondary | ICD-10-CM | POA: Diagnosis not present

## 2016-05-20 DIAGNOSIS — I509 Heart failure, unspecified: Secondary | ICD-10-CM | POA: Diagnosis not present

## 2016-05-20 DIAGNOSIS — N390 Urinary tract infection, site not specified: Secondary | ICD-10-CM | POA: Diagnosis not present

## 2016-05-20 DIAGNOSIS — L89320 Pressure ulcer of left buttock, unstageable: Secondary | ICD-10-CM | POA: Diagnosis not present

## 2016-05-20 DIAGNOSIS — I110 Hypertensive heart disease with heart failure: Secondary | ICD-10-CM | POA: Diagnosis not present

## 2016-05-21 DIAGNOSIS — I5022 Chronic systolic (congestive) heart failure: Secondary | ICD-10-CM | POA: Diagnosis not present

## 2016-05-21 DIAGNOSIS — E782 Mixed hyperlipidemia: Secondary | ICD-10-CM | POA: Diagnosis not present

## 2016-05-21 DIAGNOSIS — G603 Idiopathic progressive neuropathy: Secondary | ICD-10-CM | POA: Diagnosis not present

## 2016-05-21 DIAGNOSIS — I2510 Atherosclerotic heart disease of native coronary artery without angina pectoris: Secondary | ICD-10-CM | POA: Diagnosis not present

## 2016-05-21 DIAGNOSIS — R8290 Unspecified abnormal findings in urine: Secondary | ICD-10-CM | POA: Diagnosis not present

## 2016-05-21 DIAGNOSIS — J449 Chronic obstructive pulmonary disease, unspecified: Secondary | ICD-10-CM | POA: Diagnosis not present

## 2016-05-21 DIAGNOSIS — M797 Fibromyalgia: Secondary | ICD-10-CM | POA: Diagnosis not present

## 2016-05-23 DIAGNOSIS — J449 Chronic obstructive pulmonary disease, unspecified: Secondary | ICD-10-CM | POA: Diagnosis not present

## 2016-05-23 DIAGNOSIS — M069 Rheumatoid arthritis, unspecified: Secondary | ICD-10-CM | POA: Diagnosis not present

## 2016-05-23 DIAGNOSIS — G629 Polyneuropathy, unspecified: Secondary | ICD-10-CM | POA: Diagnosis not present

## 2016-05-23 DIAGNOSIS — L89320 Pressure ulcer of left buttock, unstageable: Secondary | ICD-10-CM | POA: Diagnosis not present

## 2016-05-23 DIAGNOSIS — D638 Anemia in other chronic diseases classified elsewhere: Secondary | ICD-10-CM | POA: Diagnosis not present

## 2016-05-23 DIAGNOSIS — M797 Fibromyalgia: Secondary | ICD-10-CM | POA: Diagnosis not present

## 2016-05-23 DIAGNOSIS — I110 Hypertensive heart disease with heart failure: Secondary | ICD-10-CM | POA: Diagnosis not present

## 2016-05-23 DIAGNOSIS — I509 Heart failure, unspecified: Secondary | ICD-10-CM | POA: Diagnosis not present

## 2016-05-23 DIAGNOSIS — N390 Urinary tract infection, site not specified: Secondary | ICD-10-CM | POA: Diagnosis not present

## 2016-05-26 DIAGNOSIS — L89320 Pressure ulcer of left buttock, unstageable: Secondary | ICD-10-CM | POA: Diagnosis not present

## 2016-05-26 DIAGNOSIS — G629 Polyneuropathy, unspecified: Secondary | ICD-10-CM | POA: Diagnosis not present

## 2016-05-26 DIAGNOSIS — M069 Rheumatoid arthritis, unspecified: Secondary | ICD-10-CM | POA: Diagnosis not present

## 2016-05-26 DIAGNOSIS — I110 Hypertensive heart disease with heart failure: Secondary | ICD-10-CM | POA: Diagnosis not present

## 2016-05-26 DIAGNOSIS — N390 Urinary tract infection, site not specified: Secondary | ICD-10-CM | POA: Diagnosis not present

## 2016-05-26 DIAGNOSIS — M797 Fibromyalgia: Secondary | ICD-10-CM | POA: Diagnosis not present

## 2016-05-26 DIAGNOSIS — I509 Heart failure, unspecified: Secondary | ICD-10-CM | POA: Diagnosis not present

## 2016-05-26 DIAGNOSIS — D638 Anemia in other chronic diseases classified elsewhere: Secondary | ICD-10-CM | POA: Diagnosis not present

## 2016-05-26 DIAGNOSIS — J449 Chronic obstructive pulmonary disease, unspecified: Secondary | ICD-10-CM | POA: Diagnosis not present

## 2016-05-27 ENCOUNTER — Other Ambulatory Visit: Payer: Self-pay | Admitting: Internal Medicine

## 2016-05-27 DIAGNOSIS — Z1231 Encounter for screening mammogram for malignant neoplasm of breast: Secondary | ICD-10-CM

## 2016-05-28 ENCOUNTER — Other Ambulatory Visit: Payer: Self-pay | Admitting: Internal Medicine

## 2016-05-28 DIAGNOSIS — C801 Malignant (primary) neoplasm, unspecified: Secondary | ICD-10-CM | POA: Diagnosis not present

## 2016-05-28 DIAGNOSIS — R0602 Shortness of breath: Secondary | ICD-10-CM | POA: Diagnosis not present

## 2016-05-28 DIAGNOSIS — I5022 Chronic systolic (congestive) heart failure: Secondary | ICD-10-CM | POA: Diagnosis not present

## 2016-05-28 DIAGNOSIS — M7989 Other specified soft tissue disorders: Secondary | ICD-10-CM

## 2016-05-28 DIAGNOSIS — G603 Idiopathic progressive neuropathy: Secondary | ICD-10-CM | POA: Diagnosis not present

## 2016-05-28 DIAGNOSIS — J449 Chronic obstructive pulmonary disease, unspecified: Secondary | ICD-10-CM | POA: Diagnosis not present

## 2016-05-28 DIAGNOSIS — I2510 Atherosclerotic heart disease of native coronary artery without angina pectoris: Secondary | ICD-10-CM | POA: Diagnosis not present

## 2016-05-29 DIAGNOSIS — D638 Anemia in other chronic diseases classified elsewhere: Secondary | ICD-10-CM | POA: Diagnosis not present

## 2016-05-29 DIAGNOSIS — N390 Urinary tract infection, site not specified: Secondary | ICD-10-CM | POA: Diagnosis not present

## 2016-05-29 DIAGNOSIS — G629 Polyneuropathy, unspecified: Secondary | ICD-10-CM | POA: Diagnosis not present

## 2016-05-29 DIAGNOSIS — I110 Hypertensive heart disease with heart failure: Secondary | ICD-10-CM | POA: Diagnosis not present

## 2016-05-29 DIAGNOSIS — I509 Heart failure, unspecified: Secondary | ICD-10-CM | POA: Diagnosis not present

## 2016-05-29 DIAGNOSIS — L89323 Pressure ulcer of left buttock, stage 3: Secondary | ICD-10-CM | POA: Diagnosis not present

## 2016-05-29 DIAGNOSIS — M069 Rheumatoid arthritis, unspecified: Secondary | ICD-10-CM | POA: Diagnosis not present

## 2016-05-29 DIAGNOSIS — J449 Chronic obstructive pulmonary disease, unspecified: Secondary | ICD-10-CM | POA: Diagnosis not present

## 2016-05-29 DIAGNOSIS — M797 Fibromyalgia: Secondary | ICD-10-CM | POA: Diagnosis not present

## 2016-06-02 DIAGNOSIS — I509 Heart failure, unspecified: Secondary | ICD-10-CM | POA: Diagnosis not present

## 2016-06-02 DIAGNOSIS — G629 Polyneuropathy, unspecified: Secondary | ICD-10-CM | POA: Diagnosis not present

## 2016-06-02 DIAGNOSIS — D638 Anemia in other chronic diseases classified elsewhere: Secondary | ICD-10-CM | POA: Diagnosis not present

## 2016-06-02 DIAGNOSIS — M797 Fibromyalgia: Secondary | ICD-10-CM | POA: Diagnosis not present

## 2016-06-02 DIAGNOSIS — N390 Urinary tract infection, site not specified: Secondary | ICD-10-CM | POA: Diagnosis not present

## 2016-06-02 DIAGNOSIS — L89323 Pressure ulcer of left buttock, stage 3: Secondary | ICD-10-CM | POA: Diagnosis not present

## 2016-06-02 DIAGNOSIS — J449 Chronic obstructive pulmonary disease, unspecified: Secondary | ICD-10-CM | POA: Diagnosis not present

## 2016-06-02 DIAGNOSIS — I110 Hypertensive heart disease with heart failure: Secondary | ICD-10-CM | POA: Diagnosis not present

## 2016-06-02 DIAGNOSIS — M069 Rheumatoid arthritis, unspecified: Secondary | ICD-10-CM | POA: Diagnosis not present

## 2016-06-05 ENCOUNTER — Ambulatory Visit: Payer: Medicare HMO

## 2016-06-05 DIAGNOSIS — L89323 Pressure ulcer of left buttock, stage 3: Secondary | ICD-10-CM | POA: Diagnosis not present

## 2016-06-05 DIAGNOSIS — M797 Fibromyalgia: Secondary | ICD-10-CM | POA: Diagnosis not present

## 2016-06-05 DIAGNOSIS — M069 Rheumatoid arthritis, unspecified: Secondary | ICD-10-CM | POA: Diagnosis not present

## 2016-06-05 DIAGNOSIS — J449 Chronic obstructive pulmonary disease, unspecified: Secondary | ICD-10-CM | POA: Diagnosis not present

## 2016-06-05 DIAGNOSIS — I509 Heart failure, unspecified: Secondary | ICD-10-CM | POA: Diagnosis not present

## 2016-06-05 DIAGNOSIS — I110 Hypertensive heart disease with heart failure: Secondary | ICD-10-CM | POA: Diagnosis not present

## 2016-06-05 DIAGNOSIS — D638 Anemia in other chronic diseases classified elsewhere: Secondary | ICD-10-CM | POA: Diagnosis not present

## 2016-06-05 DIAGNOSIS — N390 Urinary tract infection, site not specified: Secondary | ICD-10-CM | POA: Diagnosis not present

## 2016-06-05 DIAGNOSIS — G629 Polyneuropathy, unspecified: Secondary | ICD-10-CM | POA: Diagnosis not present

## 2016-06-10 DIAGNOSIS — G629 Polyneuropathy, unspecified: Secondary | ICD-10-CM | POA: Diagnosis not present

## 2016-06-10 DIAGNOSIS — L89323 Pressure ulcer of left buttock, stage 3: Secondary | ICD-10-CM | POA: Diagnosis not present

## 2016-06-10 DIAGNOSIS — M797 Fibromyalgia: Secondary | ICD-10-CM | POA: Diagnosis not present

## 2016-06-10 DIAGNOSIS — J449 Chronic obstructive pulmonary disease, unspecified: Secondary | ICD-10-CM | POA: Diagnosis not present

## 2016-06-10 DIAGNOSIS — I110 Hypertensive heart disease with heart failure: Secondary | ICD-10-CM | POA: Diagnosis not present

## 2016-06-10 DIAGNOSIS — D638 Anemia in other chronic diseases classified elsewhere: Secondary | ICD-10-CM | POA: Diagnosis not present

## 2016-06-10 DIAGNOSIS — I509 Heart failure, unspecified: Secondary | ICD-10-CM | POA: Diagnosis not present

## 2016-06-10 DIAGNOSIS — M069 Rheumatoid arthritis, unspecified: Secondary | ICD-10-CM | POA: Diagnosis not present

## 2016-06-10 DIAGNOSIS — N390 Urinary tract infection, site not specified: Secondary | ICD-10-CM | POA: Diagnosis not present

## 2016-06-11 ENCOUNTER — Ambulatory Visit
Admission: RE | Admit: 2016-06-11 | Discharge: 2016-06-11 | Disposition: A | Discharge status: Home / Self Care | Payer: Medicare HMO | Source: Ambulatory Visit | Attending: Internal Medicine | Admitting: Internal Medicine

## 2016-06-11 DIAGNOSIS — J432 Centrilobular emphysema: Secondary | ICD-10-CM | POA: Diagnosis not present

## 2016-06-11 DIAGNOSIS — I700 Atherosclerosis of aorta: Secondary | ICD-10-CM | POA: Diagnosis not present

## 2016-06-11 DIAGNOSIS — J90 Pleural effusion, not elsewhere classified: Secondary | ICD-10-CM | POA: Insufficient documentation

## 2016-06-11 DIAGNOSIS — M7989 Other specified soft tissue disorders: Secondary | ICD-10-CM | POA: Diagnosis not present

## 2016-06-11 DIAGNOSIS — R918 Other nonspecific abnormal finding of lung field: Secondary | ICD-10-CM | POA: Diagnosis not present

## 2016-06-11 DIAGNOSIS — I2510 Atherosclerotic heart disease of native coronary artery without angina pectoris: Secondary | ICD-10-CM | POA: Diagnosis not present

## 2016-06-11 MED ORDER — IOPAMIDOL (ISOVUE-300) INJECTION 61%
75.0000 mL | Freq: Once | INTRAVENOUS | Status: AC | PRN
  Administered 2016-06-11: 75 mL via INTRAVENOUS

## 2016-06-12 DIAGNOSIS — D638 Anemia in other chronic diseases classified elsewhere: Secondary | ICD-10-CM | POA: Diagnosis not present

## 2016-06-12 DIAGNOSIS — M797 Fibromyalgia: Secondary | ICD-10-CM | POA: Diagnosis not present

## 2016-06-12 DIAGNOSIS — G629 Polyneuropathy, unspecified: Secondary | ICD-10-CM | POA: Diagnosis not present

## 2016-06-12 DIAGNOSIS — J449 Chronic obstructive pulmonary disease, unspecified: Secondary | ICD-10-CM | POA: Diagnosis not present

## 2016-06-12 DIAGNOSIS — M069 Rheumatoid arthritis, unspecified: Secondary | ICD-10-CM | POA: Diagnosis not present

## 2016-06-12 DIAGNOSIS — I509 Heart failure, unspecified: Secondary | ICD-10-CM | POA: Diagnosis not present

## 2016-06-12 DIAGNOSIS — I110 Hypertensive heart disease with heart failure: Secondary | ICD-10-CM | POA: Diagnosis not present

## 2016-06-12 DIAGNOSIS — L89323 Pressure ulcer of left buttock, stage 3: Secondary | ICD-10-CM | POA: Diagnosis not present

## 2016-06-12 DIAGNOSIS — N390 Urinary tract infection, site not specified: Secondary | ICD-10-CM | POA: Diagnosis not present

## 2016-06-16 ENCOUNTER — Other Ambulatory Visit: Payer: Self-pay | Admitting: Internal Medicine

## 2016-06-16 DIAGNOSIS — D638 Anemia in other chronic diseases classified elsewhere: Secondary | ICD-10-CM | POA: Diagnosis not present

## 2016-06-16 DIAGNOSIS — I509 Heart failure, unspecified: Secondary | ICD-10-CM | POA: Diagnosis not present

## 2016-06-16 DIAGNOSIS — R9389 Abnormal findings on diagnostic imaging of other specified body structures: Secondary | ICD-10-CM

## 2016-06-16 DIAGNOSIS — I110 Hypertensive heart disease with heart failure: Secondary | ICD-10-CM | POA: Diagnosis not present

## 2016-06-16 DIAGNOSIS — R0602 Shortness of breath: Secondary | ICD-10-CM | POA: Diagnosis not present

## 2016-06-16 DIAGNOSIS — N390 Urinary tract infection, site not specified: Secondary | ICD-10-CM | POA: Diagnosis not present

## 2016-06-16 DIAGNOSIS — L89323 Pressure ulcer of left buttock, stage 3: Secondary | ICD-10-CM | POA: Diagnosis not present

## 2016-06-16 DIAGNOSIS — J449 Chronic obstructive pulmonary disease, unspecified: Secondary | ICD-10-CM | POA: Diagnosis not present

## 2016-06-16 DIAGNOSIS — M069 Rheumatoid arthritis, unspecified: Secondary | ICD-10-CM | POA: Diagnosis not present

## 2016-06-16 DIAGNOSIS — G629 Polyneuropathy, unspecified: Secondary | ICD-10-CM | POA: Diagnosis not present

## 2016-06-16 DIAGNOSIS — M797 Fibromyalgia: Secondary | ICD-10-CM | POA: Diagnosis not present

## 2016-06-16 DIAGNOSIS — I5022 Chronic systolic (congestive) heart failure: Secondary | ICD-10-CM | POA: Diagnosis not present

## 2016-06-17 ENCOUNTER — Encounter (INDEPENDENT_AMBULATORY_CARE_PROVIDER_SITE_OTHER): Payer: Self-pay | Admitting: Vascular Surgery

## 2016-06-17 ENCOUNTER — Ambulatory Visit (INDEPENDENT_AMBULATORY_CARE_PROVIDER_SITE_OTHER): Payer: Medicare HMO

## 2016-06-17 ENCOUNTER — Ambulatory Visit (INDEPENDENT_AMBULATORY_CARE_PROVIDER_SITE_OTHER): Payer: Medicare HMO | Admitting: Vascular Surgery

## 2016-06-17 VITALS — BP 130/69 | HR 82 | Resp 16

## 2016-06-17 DIAGNOSIS — M79604 Pain in right leg: Secondary | ICD-10-CM

## 2016-06-17 DIAGNOSIS — I712 Thoracic aortic aneurysm, without rupture, unspecified: Secondary | ICD-10-CM

## 2016-06-17 DIAGNOSIS — J449 Chronic obstructive pulmonary disease, unspecified: Secondary | ICD-10-CM | POA: Diagnosis not present

## 2016-06-17 NOTE — Assessment & Plan Note (Signed)
I have reviewed her recent CT scan and this does not demonstrate any significant growth of her thoracic portion of her aneurysm. It incompletely evaluates her abdominal aorta, but the thoracic portion has been more problematic previously. Unfortunately, there is something that may be a lung cancer and she has further evaluation and workup for that. I would plan a CT scan of the chest in 1 year for her aneurysm, but obviously if she needs to have the sooner for other thoracic pathology that would be reasonable as well.

## 2016-06-17 NOTE — Assessment & Plan Note (Signed)
Her ankle-brachial index is 0.98 on the right and 1.03 on the left with pretty good waveforms consistent with no significant arterial insufficiency in extremities. It does not appear as if her lower extremity pain is related to arterial insufficiency.

## 2016-06-17 NOTE — Progress Notes (Signed)
MRN : [id number]  [name] [name] is a 76 y.o. ([birth year]) female who presents with chief complaint of  Chief Complaint  Patient presents with  . Follow-up  .  History of Present Illness: Patient returns today in follow up of Leg pain. About the same as was his last visit. Since her last visit she has undergone CT scan of the chest which I have independently reviewed. Concern about malignancy in the left lung and she is getting further evaluation and has an appointment with thoracic surgeon later this month. Her aneurysm was reasonably stable in size although quite irregular still measuring about 4-1/2 cm in diameter. This is an extensive thoracoabdominal aneurysm that goes down as far as this chest CT which demonstrated. As for her legs, they're about the same. Her ankle-brachial index is 0.98 on the right and 1.03 on the left with pretty good waveforms consistent with no significant arterial insufficiency in extremities.         Current Outpatient Prescriptions  Medication Sig Dispense Refill  . Calcium-Vitamin D 600-200 MG-UNIT tablet Take by mouth.    . carvedilol (COREG) 12.5 MG tablet Take 1 tablet (12.5 mg total) by mouth 2 (two) times daily with a meal. 60 tablet 0  . clonazePAM (KLONOPIN) 1 MG tablet Take 1 mg by mouth 2 (two) times daily as needed for anxiety.     . feeding supplement, ENSURE ENLIVE, (ENSURE ENLIVE) LIQD Take 237 mLs by mouth daily at 3 pm. 237 mL 12  . gabapentin (NEURONTIN) 600 MG tablet Take 600 mg by mouth 3 (three) times daily.    . Ipratropium-Albuterol (COMBIVENT IN) Inhale into the lungs.    . nitroGLYCERIN (NITROSTAT) 0.4 MG SL tablet Place under the tongue.    . promethazine (PHENERGAN) 12.5 MG tablet Take 12.5 mg by mouth.    . traMADol (ULTRAM) 50 MG tablet Take 100 mg by mouth every 8 (eight) hours.     [name] zolpidem (AMBIEN) 5 MG tablet Take 5 mg by mouth at bedtime.    [name] aspirin 325 MG EC tablet Take 1 tablet (325 mg total) by  mouth daily. (Patient not taking: Reported on 12/04/2015) 30 tablet 0  . atorvastatin (LIPITOR) 40 MG tablet Take 1 tablet (40 mg total) by mouth daily at 6 PM. (Patient not taking: Reported on 03/04/2016) 30 tablet 0  . carvedilol (COREG) 12.5 MG tablet Take by mouth.    . clonazePAM (KLONOPIN) 1 MG tablet Take by mouth.    . ertapenem 1 g in sodium chloride 0.9 % 50 mL Inject 1 g into the vein daily. (Patient not taking: Reported on 04/29/2016) 6 Dose 0  . gabapentin (NEURONTIN) 600 MG tablet Take by mouth.    [name] imipramine (TOFRANIL) 25 MG tablet Take 1 tablet by mouth at bedtime.     . iron polysaccharides (FERREX 150) 150 MG capsule Take 150 mg by mouth daily.    [name] lisinopril (PRINIVIL,ZESTRIL) 2.5 MG tablet Take 2.5 mg by mouth daily.     No current facility-administered medications for this visit.         Past Medical History:  Diagnosis Date  . Asthma   . Breast cancer ([place]) 2009   left  . Cancer ([place])   . CHF (congestive heart failure) ([place])   . CHF (congestive heart failure) ([place])   . Chronic UTI   . COPD (chronic obstructive pulmonary disease) ([place])   . Dizziness   . Fibromyalgia   . Hypertension   .  Neuropathy   . Personal history of tobacco use, presenting hazards to health 05/17/2015  . Polyp, larynx   . RA (rheumatoid arthritis) ([place])   . Sinus infection    recent  . Stumbling gait    to the left  . Supplemental oxygen dependent    2.5l         Past Surgical History:  Procedure Laterality Date  . ABDOMINAL HYSTERECTOMY    . BREAST LUMPECTOMY Left 2009   chemo and radiation  . CYST EXCISION Left 02/27/2015   Procedure: CYST REMOVAL;  Surgeon: Hessie Knows, MD;  Location: [facility] ORS;  Service: Orthopedics;  Laterality: Left;  . EYE MUSCLE SURGERY Right    13 surgeries  . THUMB ARTHROSCOPY Left     Social History      Social History  Substance Use Topics  . Smoking status: Current Every Day Smoker     Packs/day: 1.00    Years: 40.00    Types: Cigarettes  . Smokeless tobacco: Never Used  . Alcohol use No  No IV drug use   Family History Family History  Problem Relation Age of Onset  . Diabetes Father   . Stroke Father   . Heart attack Father   . CAD Sister    No bleeding or clotting disorders        Allergies  Allergen Reactions  . Contrast Media [Iodinated Diagnostic Agents] Other (See Comments)    Pt was sent to the ED following contrast media injection at [facility]. Unknown reason. She has been premedicated since without complications. Pt to be premedicated prior to contrast media injections  . Ioxaglate Other (See Comments)    Pt was sent to the ED following contrast media injection at [facility]. Unknown reason. She has been premedicated since without complications. Pt to be premedicated prior to contrast media injections  . Sulfa Antibiotics     Other reaction(s): Other (See Comments)  . Tetracycline Hives and Other (See Comments)  . White Petrolatum Other (See Comments)  . Amoxicillin-Pot Clavulanate Rash    Blisters in mouth Other reaction(s): Unknown Blisters in mouth Blisters in mouth  . Tape Rash     REVIEW OF SYSTEMS (Negative unless checked)  Constitutional: [] Weight loss  [] Fever  [] Chills Cardiac: [] Chest pain   [] Chest pressure   [] Palpitations   [] Shortness of breath when laying flat   [] Shortness of breath at rest   [x] Shortness of breath with exertion. Vascular:  [x] Pain in legs with walking   [x] Pain in legs at rest   [] Pain in legs when laying flat   [] Claudication   [] Pain in feet when walking  [x] Pain in feet at rest  [] Pain in feet when laying flat   [] History of DVT   [] Phlebitis   [x] Swelling in legs   [] Varicose veins   [x] Non-healing ulcers Pulmonary:   [] Uses home oxygen   [] Productive cough   [] Hemoptysis   [] Wheeze  [] COPD   [] Asthma Neurologic:  [] Dizziness  [] Blackouts   [] Seizures   [] History of  stroke   [] History of TIA  [] Aphasia   [] Temporary blindness   [] Dysphagia   [] Weakness or numbness in arms   [] Weakness or numbness in legs Musculoskeletal:  [] Arthritis   [] Joint swelling   [] Joint pain   [] Low back pain Hematologic:  [] Easy bruising  [] Easy bleeding   [] Hypercoagulable state   [] Anemic   Gastrointestinal:  [] Blood in stool   [] Vomiting blood  [] Gastroesophageal reflux/heartburn   [] Abdominal pain Genitourinary:  []   Chronic kidney disease   [] Difficult urination  [] Frequent urination  [x] Burning with urination   [] Hematuria Skin:  [] Rashes   [x] Ulcers   [x] Wounds Psychological:  [] History of anxiety   []  History of major depression.   Physical Examination  BP 130/69   Pulse 82   Resp 16  Gen:  WD/WN, NAD Head: [place]/AT, No temporalis wasting. Ear/Nose/Throat: Hearing grossly intact, nares w/o erythema or drainage, trachea midline Eyes: Conjunctiva clear. Sclera non-icteric Neck: Supple.  No JVD.  Pulmonary:  Good air movement, no use of accessory muscles.  Cardiac: RRR, normal S1, S2 Vascular:  Vessel Right Left  Radial Palpable Palpable  Ulnar Palpable Palpable  Brachial Palpable Palpable  Carotid Palpable, without bruit Palpable, without bruit  Aorta Not palpable N/A  Femoral Palpable Palpable  Popliteal Not Palpable 1+ Palpable  PT 1+ Palpable 1+ Palpable  DP 1+ Palpable 1+ Palpable   Gastrointestinal: soft, non-tender/non-distended.  Musculoskeletal: M/S 5/5 throughout.  No deformity or atrophy. 1+ bilateral lower extremity edema Neurologic: Sensation grossly intact in extremities.  Symmetrical.  Speech is fluent.  Psychiatric: Judgment intact, Mood & affect appropriate for pt's clinical situation. Dermatologic: No rashes or ulcers noted.  No cellulitis or open wounds.       Labs No results found for this or any previous visit (from the past 2160 hour(s)).  Radiology Ct Chest W Contrast  Result Date: 06/11/2016 CLINICAL DATA:  Left arm swelling in  a patient status post left breast surgery for cancer. EXAM: CT CHEST WITH CONTRAST TECHNIQUE: Multidetector CT imaging of the chest was performed during intravenous contrast administration. CONTRAST:  81mL ISOVUE-300 IOPAMIDOL (ISOVUE-300) INJECTION 61% COMPARISON:  11/14/2015 FINDINGS: Cardiovascular: The heart size is normal. No pericardial effusion. Coronary artery calcification is noted. Calcification and irregularity in the wall of the thoracic aorta compatible with atherosclerosis. Descending thoracic aorta measures up to 4.3 x 4.1 cm in diameter. There is an area of ulcerated plaque or adherent mural thrombus identified along the anterior wall the descending thoracic aorta (image 67 series 2). Mediastinum/Nodes: 10 mm short axis precarinal lymph node similar to prior. 8 mm short axis subcarinal lymph node appears new in the interval. The esophagus has normal imaging features. There is no axillary lymphadenopathy. Lungs/Pleura: Centrilobular emphysema with bronchial wall thickening. No suspicious right lung lesion. Interval development of a 3.2 x 3.0 cm masslike area of focal opacity in the medial left lower lobe with obliteration of segmental airways to the anterior left lower lobe. Small left pleural effusion associated. Upper Abdomen: Prominent atherosclerotic irregularity noted in the proximal abdominal aorta. Chronic calcified plaque versus chronic dissection noted at the origin of the SMA, similar to prior. Cortical scarring noted in the incompletely visualized kidneys. Musculoskeletal: Bone windows reveal no worrisome lytic or sclerotic osseous lesions. IMPRESSION: 1. Interval development of a masslike opacity in the central left lower lobe, obliterating anterior segmental airways. New small left pleural effusion. Imaging features concerning for neoplasm. PET-CT recommended to further evaluate. 2. Advanced thoracoabdominal aortic atherosclerosis with fusiform dilatation of the descending thoracic aorta  up to 4.3 cm, similar to prior. Electronically Signed   By: Misty Stanley M.D.   On: 06/11/2016 15:34    Assessment/Plan  Pain in limb Her ankle-brachial index is 0.98 on the right and 1.03 on the left with pretty good waveforms consistent with no significant arterial insufficiency in extremities. It does not appear as if her lower extremity pain is related to arterial insufficiency.  Aneurysm of thoracic aorta ([place]) I  have reviewed her recent CT scan and this does not demonstrate any significant growth of her thoracic portion of her aneurysm. It incompletely evaluates her abdominal aorta, but the thoracic portion has been more problematic previously. Unfortunately, there is something that may be a lung cancer and she has further evaluation and workup for that. I would plan a CT scan of the chest in 1 year for her aneurysm, but obviously if she needs to have the sooner for other thoracic pathology that would be reasonable as well.    Leotis Pain, MD  06/17/2016 4:10 PM    This note was created with Dragon medical transcription system.  Any errors from dictation are purely unintentional

## 2016-06-19 ENCOUNTER — Other Ambulatory Visit: Payer: Self-pay

## 2016-06-19 DIAGNOSIS — I110 Hypertensive heart disease with heart failure: Secondary | ICD-10-CM | POA: Diagnosis not present

## 2016-06-19 DIAGNOSIS — J449 Chronic obstructive pulmonary disease, unspecified: Secondary | ICD-10-CM | POA: Diagnosis not present

## 2016-06-19 DIAGNOSIS — G629 Polyneuropathy, unspecified: Secondary | ICD-10-CM | POA: Diagnosis not present

## 2016-06-19 DIAGNOSIS — E782 Mixed hyperlipidemia: Secondary | ICD-10-CM | POA: Insufficient documentation

## 2016-06-19 DIAGNOSIS — L89323 Pressure ulcer of left buttock, stage 3: Secondary | ICD-10-CM | POA: Diagnosis not present

## 2016-06-19 DIAGNOSIS — M069 Rheumatoid arthritis, unspecified: Secondary | ICD-10-CM | POA: Diagnosis not present

## 2016-06-19 DIAGNOSIS — N390 Urinary tract infection, site not specified: Secondary | ICD-10-CM | POA: Diagnosis not present

## 2016-06-19 DIAGNOSIS — M797 Fibromyalgia: Secondary | ICD-10-CM | POA: Diagnosis not present

## 2016-06-19 DIAGNOSIS — D638 Anemia in other chronic diseases classified elsewhere: Secondary | ICD-10-CM | POA: Diagnosis not present

## 2016-06-19 DIAGNOSIS — I509 Heart failure, unspecified: Secondary | ICD-10-CM | POA: Diagnosis not present

## 2016-06-20 ENCOUNTER — Ambulatory Visit (INDEPENDENT_AMBULATORY_CARE_PROVIDER_SITE_OTHER): Payer: Commercial Managed Care - HMO | Admitting: Cardiothoracic Surgery

## 2016-06-20 ENCOUNTER — Encounter: Payer: Self-pay | Admitting: Cardiothoracic Surgery

## 2016-06-20 VITALS — BP 124/73 | HR 91 | Temp 97.3°F | Ht 62.0 in | Wt 112.0 lb

## 2016-06-20 DIAGNOSIS — R918 Other nonspecific abnormal finding of lung field: Secondary | ICD-10-CM

## 2016-06-20 NOTE — Patient Instructions (Signed)
We will refer you to see Dr. Mortimer Fries (Pulmonologist).  We will also refer you to see an Oncologist at the [facility].

## 2016-06-20 NOTE — Progress Notes (Signed)
Patient ID: [name] [name], female   DOB: 1940-10-15, 76 y.o.   MRN: [id number]  Chief Complaint  Patient presents with  . Other    plerual effusion    Referred By Dr. Braulio Conte day Reason for Referral left lower lobe mass with left pleural effusion  HPI Location, Quality, Duration, Severity, Timing, Context, Modifying Factors, Associated Signs and Symptoms.  [name] [name] is a 76 y.o. female.  Several weeks ago she began developing some left lower chest wall pain. She states that this pain is been constant without any alleviating or exacerbating factors. She did go to see her primary care physician Dr. Ginette Pitman who obtained a chest CT. The chest CT revealed a new left lower lobe mass with some pleural effusion. There may been some nodularity to the pleural space as well. This was highly concerning for malignancy and the patient was sent to me. She has been a lifelong smoker. She currently smokes 5-6 cigarettes per day. She does remove her oxygen when she smokes. She's been on home oxygen for about a year and a half now. She is able to walk up and down a flight of stairs but slowly. She also has a history of rheumatoid arthritis and history of breast cancer which has been managed by Dr. Ramond Craver he in the past. She had previously undergone a lumpectomy on the left side followed by radiation therapy and chemotherapy and was told recently that she was disease-free. She also sees Dr. dew for vascular concerns. She has severe atherosclerosis with aneurysmal dilatation of the aorta. None of this has required intervention.   Past Medical History:  Diagnosis Date  . Asthma   . Breast cancer ([place]) 2009   left  . Cancer ([place])   . CHF (congestive heart failure) ([place])   . CHF (congestive heart failure) ([place])   . Chronic UTI   . COPD (chronic obstructive pulmonary disease) ([place])   . Dizziness   . Fibromyalgia   . Hypertension   . Neuropathy   . Personal history of tobacco use, presenting hazards to health  05/17/2015  . Polyp, larynx   . RA (rheumatoid arthritis) ([place])   . Sinus infection    recent  . Stumbling gait    to the left  . Supplemental oxygen dependent    2.5l    Past Surgical History:  Procedure Laterality Date  . ABDOMINAL HYSTERECTOMY    . BREAST LUMPECTOMY Left 2009   chemo and radiation  . CYST EXCISION Left 02/27/2015   Procedure: CYST REMOVAL;  Surgeon: Hessie Knows, MD;  Location: [facility] ORS;  Service: Orthopedics;  Laterality: Left;  . EYE MUSCLE SURGERY Right    13 surgeries  . THUMB ARTHROSCOPY Left     Family History  Problem Relation Age of Onset  . Diabetes Father   . Stroke Father   . Heart attack Father   . CAD Sister     Social History Social History  Substance Use Topics  . Smoking status: Current Every Day Smoker    Packs/day: 1.00    Years: 40.00    Types: Cigarettes  . Smokeless tobacco: Never Used  . Alcohol use No    Allergies  Allergen Reactions  . Contrast Media [Iodinated Diagnostic Agents] Other (See Comments)    Pt was sent to the ED following contrast media injection at [place]. Unknown reason. She has been premedicated since without complications. Pt to be premedicated prior to contrast media injections  . Ioxaglate Other (  See Comments)    Pt was sent to the ED following contrast media injection at [place]. Unknown reason. She has been premedicated since without complications. Pt to be premedicated prior to contrast media injections  . Sulfa Antibiotics     Other reaction(s): Other (See Comments)  . Tetracycline Hives and Other (See Comments)  . White Petrolatum Other (See Comments)  . Amoxicillin-Pot Clavulanate Rash    Other reaction(s): Unknown Other reaction(s): Unknown Blisters in mouth Blisters in mouth Blisters in mouth Blisters in mouth Blisters in mouth Other reaction(s): Unknown Blisters in mouth Blisters in mouth  . Tape Rash    Current Outpatient Prescriptions  Medication Sig  Dispense Refill  . Calcium-Vitamin D 600-200 MG-UNIT tablet Take by mouth.    . carvedilol (COREG) 12.5 MG tablet Take 1 tablet (12.5 mg total) by mouth 2 (two) times daily with a meal. 60 tablet 0  . cholecalciferol (VITAMIN D) 1000 units tablet Take 1,000 Units by mouth daily.    . clonazePAM (KLONOPIN) 1 MG tablet Take 1 mg by mouth 2 (two) times daily as needed for anxiety.     . ertapenem 1 g in sodium chloride 0.9 % 50 mL Inject 1 g into the vein daily. 6 Dose 0  . gabapentin (NEURONTIN) 600 MG tablet Take by mouth.    [name] imipramine (TOFRANIL) 25 MG tablet Take 1 tablet by mouth at bedtime.     . Ipratropium-Albuterol (COMBIVENT IN) Inhale into the lungs.    . iron polysaccharides (FERREX 150) 150 MG capsule Take 150 mg by mouth daily.    . Multiple Vitamin (MULTIVITAMIN) capsule Take 1 capsule by mouth daily.    . traMADol (ULTRAM) 50 MG tablet Take 100 mg by mouth every 8 (eight) hours.     . vitamin C (ASCORBIC ACID) 500 MG tablet Take 500 mg by mouth daily.    [name] ZINC SULFATE PO Take 1 tablet by mouth 1 day or 1 dose.    . zolpidem (AMBIEN) 5 MG tablet Take 5 mg by mouth at bedtime.     No current facility-administered medications for this visit.       Review of Systems A complete review of systems was asked and was negative except for the following positive findingsNasal polyps and oropharyngeal polyps, chest pain, shortness of breath, joint pain, excessive thirst.  Blood pressure 124/73, pulse 91, temperature 97.3 F (36.3 C), temperature source Oral, height 5\' 2"  (1.575 m), weight 112 lb (50.8 kg), SpO2 95 %.  Physical Exam CONSTITUTIONAL:  Pleasant, well-developed, well-nourished, and in no acute distress. EYES: Pupils equal and reactive to light, Sclera non-icteric EARS, NOSE, MOUTH AND THROAT:  The oropharynx was clear.  Dentition is good repair.  Oral mucosa pink and moist. LYMPH NODES:  Lymph nodes in the neck and axillae were normal RESPIRATORY:  Lungs were clear.   Normal respiratory effort without pathologic use of accessory muscles of respiration CARDIOVASCULAR: Heart was regular with 2/6 murmur.  There were no carotid bruits.  Initially the cardiac rhythm was quite irregular for proximate 10 seconds but then upon further evaluation was normal. There is a systolic murmur present. GI: The abdomen was soft, nontender, and nondistended. There were no palpable masses. There was no hepatosplenomegaly. There were normal bowel sounds in all quadrants. GU:  Rectal deferred.   MUSCULOSKELETAL:  Normal muscle strength and tone.  No clubbing or cyanosis.  Obvious stigmata of rheumatoid arthritis SKIN:  There were no pathologic skin lesions.  There  were no nodules on palpation. NEUROLOGIC:  Sensation is normal.  Cranial nerves are grossly intact. PSYCH:  Oriented to person, place and time.  Mood and affect are normal.  Data Reviewed CT scan of the chest  I have personally reviewed the patient's imaging, laboratory findings and medical records.    Assessment    I have independently reviewed the patient's CT scan of the chest. It is quite concerning for left lower lobe malignancy with a malignant pleural effusion. The amount of fluid however somewhat small and I did recommend that she undergo bronchoscopy for an attempted biopsy. I believe that would be safer than a percutaneous lung biopsy. The amount of pleural fluid is somewhat small and I doubt will be amenable to interventional radiology drainage.    Plan    I asked her to [place] to see Dr. Patricia Pesa to consider discussions regarding bronchoscopy. I also asked her to see a doctor in oncology as well for her follow-up. She is scheduled to have a PET scan done as well. I did not make a return visit for this point in time as I do not believe that she has a surgical problem that I may fix.       Nestor Lewandowsky, MD 06/20/2016, 12:08 PM

## 2016-06-23 ENCOUNTER — Telehealth: Payer: Self-pay | Admitting: Internal Medicine

## 2016-06-23 DIAGNOSIS — I110 Hypertensive heart disease with heart failure: Secondary | ICD-10-CM | POA: Diagnosis not present

## 2016-06-23 DIAGNOSIS — G629 Polyneuropathy, unspecified: Secondary | ICD-10-CM | POA: Diagnosis not present

## 2016-06-23 DIAGNOSIS — L89323 Pressure ulcer of left buttock, stage 3: Secondary | ICD-10-CM | POA: Diagnosis not present

## 2016-06-23 DIAGNOSIS — J449 Chronic obstructive pulmonary disease, unspecified: Secondary | ICD-10-CM | POA: Diagnosis not present

## 2016-06-23 DIAGNOSIS — M797 Fibromyalgia: Secondary | ICD-10-CM | POA: Diagnosis not present

## 2016-06-23 DIAGNOSIS — M069 Rheumatoid arthritis, unspecified: Secondary | ICD-10-CM | POA: Diagnosis not present

## 2016-06-23 DIAGNOSIS — N390 Urinary tract infection, site not specified: Secondary | ICD-10-CM | POA: Diagnosis not present

## 2016-06-23 DIAGNOSIS — D638 Anemia in other chronic diseases classified elsewhere: Secondary | ICD-10-CM | POA: Diagnosis not present

## 2016-06-23 DIAGNOSIS — I509 Heart failure, unspecified: Secondary | ICD-10-CM | POA: Diagnosis not present

## 2016-06-23 NOTE — Telephone Encounter (Signed)
Angie at [facility] is calling, Dr. Genevive Bi would like pt to get in sooner than 6/26. Please advise of availabilty

## 2016-06-23 NOTE — Telephone Encounter (Signed)
If can be seen sooner, please fit her in

## 2016-06-23 NOTE — Telephone Encounter (Signed)
Please advise as to if patient needs to be seen more urgent than 6/26.

## 2016-06-24 ENCOUNTER — Ambulatory Visit
Admission: RE | Admit: 2016-06-24 | Discharge: 2016-06-24 | Disposition: A | Discharge status: Home / Self Care | Payer: Medicare HMO | Source: Ambulatory Visit | Attending: Radiation Oncology | Admitting: Radiation Oncology

## 2016-06-24 ENCOUNTER — Encounter: Payer: Self-pay | Admitting: *Deleted

## 2016-06-24 ENCOUNTER — Ambulatory Visit: Payer: Medicare HMO

## 2016-06-24 ENCOUNTER — Encounter: Payer: Self-pay | Admitting: Radiation Oncology

## 2016-06-24 VITALS — BP 127/76 | HR 85 | Temp 96.4°F | Resp 20 | Wt 113.6 lb

## 2016-06-24 DIAGNOSIS — F17210 Nicotine dependence, cigarettes, uncomplicated: Secondary | ICD-10-CM | POA: Insufficient documentation

## 2016-06-24 DIAGNOSIS — Z79899 Other long term (current) drug therapy: Secondary | ICD-10-CM | POA: Diagnosis not present

## 2016-06-24 DIAGNOSIS — Z510 Encounter for antineoplastic radiation therapy: Secondary | ICD-10-CM | POA: Diagnosis not present

## 2016-06-24 DIAGNOSIS — I110 Hypertensive heart disease with heart failure: Secondary | ICD-10-CM | POA: Diagnosis not present

## 2016-06-24 DIAGNOSIS — M797 Fibromyalgia: Secondary | ICD-10-CM | POA: Diagnosis not present

## 2016-06-24 DIAGNOSIS — M069 Rheumatoid arthritis, unspecified: Secondary | ICD-10-CM | POA: Diagnosis not present

## 2016-06-24 DIAGNOSIS — Z86010 Personal history of colonic polyps: Secondary | ICD-10-CM | POA: Insufficient documentation

## 2016-06-24 DIAGNOSIS — Z87891 Personal history of nicotine dependence: Secondary | ICD-10-CM | POA: Diagnosis not present

## 2016-06-24 DIAGNOSIS — Z87442 Personal history of urinary calculi: Secondary | ICD-10-CM | POA: Diagnosis not present

## 2016-06-24 DIAGNOSIS — I509 Heart failure, unspecified: Secondary | ICD-10-CM | POA: Diagnosis not present

## 2016-06-24 DIAGNOSIS — J449 Chronic obstructive pulmonary disease, unspecified: Secondary | ICD-10-CM | POA: Diagnosis not present

## 2016-06-24 DIAGNOSIS — Z923 Personal history of irradiation: Secondary | ICD-10-CM | POA: Diagnosis not present

## 2016-06-24 DIAGNOSIS — R918 Other nonspecific abnormal finding of lung field: Secondary | ICD-10-CM | POA: Insufficient documentation

## 2016-06-24 DIAGNOSIS — G629 Polyneuropathy, unspecified: Secondary | ICD-10-CM | POA: Diagnosis not present

## 2016-06-24 DIAGNOSIS — C3432 Malignant neoplasm of lower lobe, left bronchus or lung: Secondary | ICD-10-CM | POA: Insufficient documentation

## 2016-06-24 DIAGNOSIS — Z853 Personal history of malignant neoplasm of breast: Secondary | ICD-10-CM | POA: Insufficient documentation

## 2016-06-24 NOTE — Telephone Encounter (Signed)
This patient can be scheduled tomorrow at 10 if the 10:15 patient is moved to 10:30.

## 2016-06-24 NOTE — Consult Note (Signed)
NEW PATIENT EVALUATION  Name: [name] [name]  MRN: [id number]  Date:   06/24/2016     DOB: 1940/05/31   This 76 y.o. female patient presents to the clinic for initial evaluation of left lower lung lung mass consistent with non-small cell lung cancer.  REFERRING PHYSICIAN: Tracie Harrier, MD  CHIEF COMPLAINT:  Chief Complaint  Patient presents with  . Lung Cancer    Pt is here for initial consultation of lung cancer    DIAGNOSIS: The encounter diagnosis was Lung mass.   PREVIOUS INVESTIGATIONS:  CT scan and PET CT scan reviewed Clinical notes reviewed  HPI: Patient is a 76 year old female previous patient of mine having received lumpectomy and left breast radiation proximal me 9 years prior. She presented with left lower chest wall pain and sore her PMD who obtained a CT scan of his chest. CT scan demonstrated left lower lobe lung mass with small pleural effusion. There is also possibility of mediastinal adenopathy. She is being scheduled with pulmonology for attempted biopsy. She's been seen by thoracic surgeon who has referred for radiation oncology opinion. She does use nasal oxygen at night and continues to smoke. She also has significant comorbidities including congestive heart failure chronic UTIs fibromyalgia and polyps in her larynx causing some dysphasia. PET CT scan has been ordered for this week  PLANNED TREATMENT REGIMEN: Possible concurrent chemoradiation  PAST MEDICAL HISTORY:  has a past medical history of Asthma; Breast cancer ([place]) (2009); Cancer [facility]); CHF (congestive heart failure) ([place]); CHF (congestive heart failure) ([place]); Chronic UTI; COPD (chronic obstructive pulmonary disease) ([place]); Dizziness; Fibromyalgia; Hypertension; Neuropathy; Personal history of tobacco use, presenting hazards to health (05/17/2015); Polyp, larynx; RA (rheumatoid arthritis) ([place]); Sinus infection; Stumbling gait; and Supplemental oxygen dependent.    PAST SURGICAL HISTORY:  Past  Surgical History:  Procedure Laterality Date  . ABDOMINAL HYSTERECTOMY    . BREAST LUMPECTOMY Left 2009   chemo and radiation  . CYST EXCISION Left 02/27/2015   Procedure: CYST REMOVAL;  Surgeon: Hessie Knows, MD;  Location: [facility] ORS;  Service: Orthopedics;  Laterality: Left;  . EYE MUSCLE SURGERY Right    13 surgeries  . THUMB ARTHROSCOPY Left     FAMILY HISTORY: family history includes CAD in her sister; Diabetes in her father; Heart attack in her father; Stroke in her father.  SOCIAL HISTORY:  reports that she has been smoking Cigarettes.  She has a 40.00 pack-year smoking history. She has never used smokeless tobacco. She reports that she does not drink alcohol or use drugs.  ALLERGIES: Contrast media [iodinated diagnostic agents]; Ioxaglate; Sulfa antibiotics; Tetracycline; White petrolatum; Amoxicillin-pot clavulanate; and Tape  MEDICATIONS:  Current Outpatient Prescriptions  Medication Sig Dispense Refill  . Calcium-Vitamin D 600-200 MG-UNIT tablet Take by mouth.    . carvedilol (COREG) 12.5 MG tablet Take 1 tablet (12.5 mg total) by mouth 2 (two) times daily with a meal. 60 tablet 0  . cholecalciferol (VITAMIN D) 1000 units tablet Take 1,000 Units by mouth daily.    . clonazePAM (KLONOPIN) 1 MG tablet Take 1 mg by mouth 2 (two) times daily as needed for anxiety.     . gabapentin (NEURONTIN) 600 MG tablet Take by mouth.    . Ipratropium-Albuterol (COMBIVENT IN) Inhale into the lungs.    . iron polysaccharides (FERREX 150) 150 MG capsule Take 150 mg by mouth daily.    . Multiple Vitamin (MULTIVITAMIN) capsule Take 1 capsule by mouth daily.    . traMADol (ULTRAM) 50 MG  tablet Take 100 mg by mouth every 8 (eight) hours.     . vitamin C (ASCORBIC ACID) 500 MG tablet Take 500 mg by mouth daily.    [name] ZINC SULFATE PO Take 1 tablet by mouth 1 day or 1 dose.    . zolpidem (AMBIEN) 5 MG tablet Take 5 mg by mouth at bedtime.    . ertapenem 1 g in sodium chloride 0.9 % 50 mL Inject 1 g  into the vein daily. (Patient not taking: Reported on 06/24/2016) 6 Dose 0  . HYDROcodone-acetaminophen (NORCO/VICODIN) 5-325 MG tablet     . imipramine (TOFRANIL) 25 MG tablet Take 1 tablet by mouth at bedtime.      No current facility-administered medications for this encounter.     ECOG PERFORMANCE STATUS:  0 - Asymptomatic  REVIEW OF SYSTEMS: Except for multiple comorbidities described above  Patient denies any weight loss, fatigue, weakness, fever, chills or night sweats. Patient denies any loss of vision, blurred vision. Patient denies any ringing  of the ears or hearing loss. No irregular heartbeat. Patient denies heart murmur or history of fainting. Patient denies any chest pain or pain radiating to her upper extremities. Patient denies any shortness of breath, difficulty breathing at night, cough or hemoptysis. Patient denies any swelling in the lower legs. Patient denies any nausea vomiting, vomiting of blood, or coffee ground material in the vomitus. Patient denies any stomach pain. Patient states has had normal bowel movements no significant constipation or diarrhea. Patient denies any dysuria, hematuria or significant nocturia. Patient denies any problems walking, swelling in the joints or loss of balance. Patient denies any skin changes, loss of hair or loss of weight. Patient denies any excessive worrying or anxiety or significant depression. Patient denies any problems with insomnia. Patient denies excessive thirst, polyuria, polydipsia. Patient denies any swollen glands, patient denies easy bruising or easy bleeding. Patient denies any recent infections, allergies or URI. Patient "s visual fields have not changed significantly in recent time.    PHYSICAL EXAM: BP 127/76   Pulse 85   Temp (!) 96.4 F (35.8 C)   Resp 20   Wt 113 lb 10.4 oz (51.5 kg)   BMI 20.79 kg/m  Well-developed thin female in NAD. Well-developed well-nourished patient in NAD. HEENT reveals PERLA, EOMI, discs  not visualized.  Oral cavity is clear. No oral mucosal lesions are identified. Neck is clear without evidence of cervical or supraclavicular adenopathy. Lungs are clear to A&P. Cardiac examination is essentially unremarkable with regular rate and rhythm without murmur rub or thrill. Abdomen is benign with no organomegaly or masses noted. Motor sensory and DTR levels are equal and symmetric in the upper and lower extremities. Cranial nerves II through XII are grossly intact. Proprioception is intact. No peripheral adenopathy or edema is identified. No motor or sensory levels are noted. Crude visual fields are within normal range.  LABORATORY DATA: Biopsy and pathology report pending    RADIOLOGY RESULTS: CT scan reviewed and compatible with the above-stated findings. PET CT scan pending   IMPRESSION: Probable locally advanced non-small cell lung cancer in 76 year old female  PLAN: At this time I need to review her PET CT scan as well as her pathology. PET CT scan has been arranged we are trying to expedite appointment with pulmonology. I have briefly reviewed risks and benefits of radiation therapy to her chest. She does have a thoracic aortic aneurysm in close proximity to her known tumor. Depending on final pathology results will  make final recommendations as far as radiation versus concurrent chemoradiation. I have set the patient up tentatively for treatment planning about a week's time after all reports and final results are in and will review those results with the patient at that time. Patient also has medical oncology appointment in the near future.   I would like to take this opportunity to thank you for allowing me to participate in the care of your patient...  Armstead Peaks., MD

## 2016-06-24 NOTE — Progress Notes (Signed)
  Oncology Nurse Navigator Documentation  Navigator Location: [facility]-Med Onc (06/24/16 1300) Referral date to RadOnc/MedOnc: 06/20/16 (06/24/16 1300) )Navigator Encounter Type: Initial RadOnc (06/24/16 1300)   Abnormal Finding Date: 06/11/16 (06/24/16 1300)                 Patient Visit Type: RadOnc (06/24/16 1300) Treatment Phase: Abnormal Scans (06/24/16 1300) Barriers/Navigation Needs: Coordination of Care (06/24/16 1300)   Interventions: Coordination of Care (06/24/16 1300)   Coordination of Care: Appts;Broncoscopy (06/24/16 1300)        Acuity: Level 2 (06/24/16 1300)   Acuity Level 2: Educational needs;Assistance expediting appointments;Initial guidance, education and coordination as needed (06/24/16 1300)    Met with patient during initial consultation with Dr. Baruch Gouty. Introduced to navigator services. Contact information given to pt and encouraged to contact me with any future needs or questions. All questions answered at the time of appointment. Informed pt will follow up with her at her next appt on 6/13 at 2:15pm in [place]. Pt verbalized understanding.     Time Spent with Patient: 45 (06/24/16 1300)

## 2016-06-25 ENCOUNTER — Inpatient Hospital Stay: Payer: Medicare HMO | Admitting: Hematology and Oncology

## 2016-06-25 DIAGNOSIS — C3432 Malignant neoplasm of lower lobe, left bronchus or lung: Secondary | ICD-10-CM | POA: Insufficient documentation

## 2016-06-25 NOTE — Progress Notes (Deleted)
[facility] day:  06/25/2016  Chief Complaint: [name] [name] is a 76 y.o. female with a history of stage x breast cancer who is seen for assessment of a left lower lobe mass.  HPI: The patient has a history of breast cancer.  She was last seen by Dr. Oliva Bustard on 05/11/2015.  She was diagnosed with stage IA (T1cN0M0) left breast cancer in 02/2005.  She underwent lumpectomy and radiation followed by abbreviated chemotherapy and hormonal therapy.  Tumor was ER + , PR +, and Her2/neu -.  Two sentinel lymph nodes were negative.  She completed Femara in 08/2010.  At that time, she was discharged from clinic as she was 10 years post diagnosis and treatment.  Chest CT with contrast on 06/11/2016 revealed a 3.2 x 3.0 mass like area of focal opacity in the medial left lower lobe with obliteration of segmental airways to the anterior left lower lobe.There was a small pleural effusion.  There was advanced thoracoabdominal aortic atherosclerosis with fusiform dilatation of the descending thoracic aorta up to 4.3 cm, similar to 11/14/2015.  weeks ago she began developing some left lower chest wall pain. She states that this pain is been constant without any alleviating or exacerbating factors. She did go to see her primary care physician Dr. Ginette Pitman who obtained a chest CT. The chest CT revealed a new left lower lobe mass with some pleural effusion. There may been some nodularity to the pleural space as well. This was highly concerning for malignancy and the patient was sent to me. She has been a lifelong smoker. She currently smokes 5-6 cigarettes per day. She does remove her oxygen when she smokes. She's been on home oxygen for about a year and a half now. She is able to walk up and down a flight of stairs but slowly. She also has a history of rheumatoid arthritis and history of breast cancer which has been managed by Dr. Ramond Craver he in the past. She had previously undergone a  lumpectomy on the left side followed by radiation therapy and chemotherapy and was told recently that she was disease-free. She also sees Dr. dew for vascular concerns. She has severe atherosclerosis with aneurysmal dilatation of the aorta. None of this has required intervention.  She was seen by Dr. Genevive Bi on 06/20/2016.  It was recommended that she undergo bronchoscopy and biopsy.  Dr. Mortimer Fries was consulted.    She was seen by Dr. Baruch Gouty on 06/24/2016.  She was felt to [facility] have locally advanced lung cancer. Recommendations were pending results of biopsy (radiation versus concurrent chemotherapy and radiation).  PET scan is scheduled.  Past Medical History:  Diagnosis Date  . Asthma   . Breast cancer ([place]) 2009   left  . Cancer ([place])   . CHF (congestive heart failure) ([place])   . CHF (congestive heart failure) ([place])   . Chronic UTI   . COPD (chronic obstructive pulmonary disease) ([place])   . Dizziness   . Fibromyalgia   . Hypertension   . Neuropathy   . Personal history of tobacco use, presenting hazards to health 05/17/2015  . Polyp, larynx   . RA (rheumatoid arthritis) ([place])   . Sinus infection    recent  . Stumbling gait    to the left  . Supplemental oxygen dependent    2.5l    Past Surgical History:  Procedure Laterality Date  . ABDOMINAL HYSTERECTOMY    . BREAST LUMPECTOMY Left 2009  chemo and radiation  . CYST EXCISION Left 02/27/2015   Procedure: CYST REMOVAL;  Surgeon: Hessie Knows, MD;  Location: [facility] ORS;  Service: Orthopedics;  Laterality: Left;  . EYE MUSCLE SURGERY Right    13 surgeries  . THUMB ARTHROSCOPY Left     Family History  Problem Relation Age of Onset  . Diabetes Father   . Stroke Father   . Heart attack Father   . CAD Sister     Social History:  reports that she has been smoking Cigarettes.  She has a 40.00 pack-year smoking history. She has never used smokeless tobacco. She reports that she does not drink alcohol or use drugs.  The patient is  accompanied by *** alone today.  Allergies:  Allergies  Allergen Reactions  . Contrast Media [Iodinated Diagnostic Agents] Other (See Comments)    Pt was sent to the ED following contrast media injection at [place]. Unknown reason. She has been premedicated since without complications. Pt to be premedicated prior to contrast media injections  . Ioxaglate Other (See Comments)    Pt was sent to the ED following contrast media injection at [place]. Unknown reason. She has been premedicated since without complications. Pt to be premedicated prior to contrast media injections  . Sulfa Antibiotics     Other reaction(s): Other (See Comments)  . Tetracycline Hives and Other (See Comments)  . White Petrolatum Other (See Comments)  . Amoxicillin-Pot Clavulanate Rash    Other reaction(s): Unknown Other reaction(s): Unknown Blisters in mouth Blisters in mouth Blisters in mouth Blisters in mouth Blisters in mouth Other reaction(s): Unknown Blisters in mouth Blisters in mouth  . Tape Rash    Current Medications: Current Outpatient Prescriptions  Medication Sig Dispense Refill  . Calcium-Vitamin D 600-200 MG-UNIT tablet Take by mouth.    . carvedilol (COREG) 12.5 MG tablet Take 1 tablet (12.5 mg total) by mouth 2 (two) times daily with a meal. 60 tablet 0  . cholecalciferol (VITAMIN D) 1000 units tablet Take 1,000 Units by mouth daily.    . clonazePAM (KLONOPIN) 1 MG tablet Take 1 mg by mouth 2 (two) times daily as needed for anxiety.     . ertapenem 1 g in sodium chloride 0.9 % 50 mL Inject 1 g into the vein daily. (Patient not taking: Reported on 06/24/2016) 6 Dose 0  . gabapentin (NEURONTIN) 600 MG tablet Take by mouth.    [name] HYDROcodone-acetaminophen (NORCO/VICODIN) 5-325 MG tablet     . imipramine (TOFRANIL) 25 MG tablet Take 1 tablet by mouth at bedtime.     . Ipratropium-Albuterol (COMBIVENT IN) Inhale into the lungs.    . iron polysaccharides (FERREX 150) 150 MG  capsule Take 150 mg by mouth daily.    . Multiple Vitamin (MULTIVITAMIN) capsule Take 1 capsule by mouth daily.    . traMADol (ULTRAM) 50 MG tablet Take 100 mg by mouth every 8 (eight) hours.     . vitamin C (ASCORBIC ACID) 500 MG tablet Take 500 mg by mouth daily.    [name] ZINC SULFATE PO Take 1 tablet by mouth 1 day or 1 dose.    . zolpidem (AMBIEN) 5 MG tablet Take 5 mg by mouth at bedtime.     No current facility-administered medications for this visit.     Review of Systems:  GENERAL:  Feels good.  Active.  No fevers, sweats or weight loss. PERFORMANCE STATUS (ECOG):  *** HEENT:  No visual changes, runny nose, sore throat, mouth  sores or tenderness. Lungs: No shortness of breath or cough.  No hemoptysis. Cardiac:  No chest pain, palpitations, orthopnea, or PND. GI:  No nausea, vomiting, diarrhea, constipation, melena or hematochezia. GU:  No urgency, frequency, dysuria, or hematuria. Musculoskeletal:  No back pain.  No joint pain.  No muscle tenderness. Extremities:  No pain or swelling. Skin:  No rashes or skin changes. Neuro:  No headache, numbness or weakness, balance or coordination issues. Endocrine:  No diabetes, thyroid issues, hot flashes or night sweats. Psych:  No mood changes, depression or anxiety. Pain:  No focal pain. Review of systems:  All other systems reviewed and found to be negative.   Physical Exam: There were no vitals taken for this visit. GENERAL:  Well developed, well nourished, sitting comfortably in the exam room in no acute distress. MENTAL STATUS:  Alert and oriented to person, place and time. HEAD:  *** hair.  Normocephalic, atraumatic, face symmetric, no Cushingoid features. EYES:  *** eyes.  Pupils equal round and reactive to light and accomodation.  No conjunctivitis or scleral icterus. ENT:  Oropharynx clear without lesion.  Tongue normal. Mucous membranes moist.  RESPIRATORY:  Clear to auscultation without rales, wheezes or  rhonchi. CARDIOVASCULAR:  Regular rate and rhythm without murmur, rub or gallop. ABDOMEN:  Soft, non-tender, with active bowel sounds, and no hepatosplenomegaly.  No masses. SKIN:  No rashes, ulcers or lesions. EXTREMITIES: No edema, no skin discoloration or tenderness.  No palpable cords. LYMPH NODES: No palpable cervical, supraclavicular, axillary or inguinal adenopathy  NEUROLOGICAL: Unremarkable. PSYCH:  Appropriate.  No visits with results within 3 Day(s) from this visit.  Latest known visit with results is:  Admission on 03/04/2016, Discharged on 03/06/2016  Component Date Value Ref Range Status  . Sodium 03/04/2016 128* 135 - 145 mmol/L Final  . Potassium 03/04/2016 4.2  3.5 - 5.1 mmol/L Final  . Chloride 03/04/2016 92* 101 - 111 mmol/L Final  . CO2 03/04/2016 31  22 - 32 mmol/L Final  . Glucose, Bld 03/04/2016 95  65 - 99 mg/dL Final  . BUN 03/04/2016 13  6 - 20 mg/dL Final  . Creatinine, Ser 03/04/2016 1.11* 0.44 - 1.00 mg/dL Final  . Calcium 03/04/2016 8.5* 8.9 - 10.3 mg/dL Final  . Total Protein 03/04/2016 6.8  6.5 - 8.1 g/dL Final  . Albumin 03/04/2016 3.2* 3.5 - 5.0 g/dL Final  . AST 03/04/2016 20  15 - 41 U/L Final  . ALT 03/04/2016 7* 14 - 54 U/L Final  . Alkaline Phosphatase 03/04/2016 92  38 - 126 U/L Final  . Total Bilirubin 03/04/2016 0.2* 0.3 - 1.2 mg/dL Final  . GFR calc non Af Amer 03/04/2016 48* >60 mL/min Final  . GFR calc Af Amer 03/04/2016 56* >60 mL/min Final   Comment: (NOTE) The eGFR has been calculated using the CKD EPI equation. This calculation has not been validated in all clinical situations. eGFR's persistently <60 mL/min signify possible Chronic Kidney Disease.   . Anion gap 03/04/2016 5  5 - 15 Final  . WBC 03/04/2016 5.9  3.6 - 11.0 K/uL Final  . RBC 03/04/2016 3.28* 3.80 - 5.20 MIL/uL Final  . Hemoglobin 03/04/2016 9.9* 12.0 - 16.0 g/dL Final  . HCT 03/04/2016 29.2* 35.0 - 47.0 % Final  . MCV 03/04/2016 89.0  80.0 - 100.0 fL Final  . MCH  03/04/2016 30.1  26.0 - 34.0 pg Final  . MCHC 03/04/2016 33.8  32.0 - 36.0 g/dL Final  . RDW 03/04/2016  16.3* 11.5 - 14.5 % Final  . Platelets 03/04/2016 250  150 - 440 K/uL Final  . Color, Urine 03/04/2016 YELLOW* YELLOW Final  . APPearance 03/04/2016 CLEAR* CLEAR Final  . Specific Gravity, Urine 03/04/2016 1.006  1.005 - 1.030 Final  . pH 03/04/2016 6.0  5.0 - 8.0 Final  . Glucose, UA 03/04/2016 NEGATIVE  NEGATIVE mg/dL Final  . Hgb urine dipstick 03/04/2016 NEGATIVE  NEGATIVE Final  . Bilirubin Urine 03/04/2016 NEGATIVE  NEGATIVE Final  . Ketones, ur 03/04/2016 NEGATIVE  NEGATIVE mg/dL Final  . Protein, ur 03/04/2016 NEGATIVE  NEGATIVE mg/dL Final  . Nitrite 03/04/2016 POSITIVE* NEGATIVE Final  . Leukocytes, UA 03/04/2016 SMALL* NEGATIVE Final  . RBC / HPF 03/04/2016 0-5  0 - 5 RBC/hpf Final  . WBC, UA 03/04/2016 6-30  0 - 5 WBC/hpf Final  . Bacteria, UA 03/04/2016 MANY* NONE SEEN Final  . Squamous Epithelial / LPF 03/04/2016 0-5* NONE SEEN Final  . Mucous 03/04/2016 PRESENT   Final  . Specimen Description 03/04/2016 URINE, RANDOM   Final  . Special Requests 03/04/2016 NONE   Final  . Culture 03/04/2016 *  Final                   Value:80,000 COLONIES/mL ESCHERICHIA COLI Confirmed Extended Spectrum Beta-Lactamase Producer (ESBL) Performed at [facility], [place] [address], [place], [place] [address]   . Report Status 03/04/2016 03/06/2016 FINAL   Final  . Organism ID, Bacteria 03/04/2016 ESCHERICHIA COLI*  Final  . WBC 03/05/2016 4.6  3.6 - 11.0 K/uL Final  . RBC 03/05/2016 3.30* 3.80 - 5.20 MIL/uL Final  . Hemoglobin 03/05/2016 9.9* 12.0 - 16.0 g/dL Final  . HCT 03/05/2016 29.2* 35.0 - 47.0 % Final  . MCV 03/05/2016 88.6  80.0 - 100.0 fL Final  . MCH 03/05/2016 30.0  26.0 - 34.0 pg Final  . MCHC 03/05/2016 33.8  32.0 - 36.0 g/dL Final  . RDW 03/05/2016 16.5* 11.5 - 14.5 % Final  . Platelets 03/05/2016 251  150 - 440 K/uL Final  . Sodium 03/05/2016 136  135 - 145 mmol/L  Final  . Potassium 03/05/2016 4.2  3.5 - 5.1 mmol/L Final  . Chloride 03/05/2016 102  101 - 111 mmol/L Final  . CO2 03/05/2016 28  22 - 32 mmol/L Final  . Glucose, Bld 03/05/2016 94  65 - 99 mg/dL Final  . BUN 03/05/2016 10  6 - 20 mg/dL Final  . Creatinine, Ser 03/05/2016 1.02* 0.44 - 1.00 mg/dL Final  . Calcium 03/05/2016 8.3* 8.9 - 10.3 mg/dL Final  . GFR calc non Af Amer 03/05/2016 53* >60 mL/min Final  . GFR calc Af Amer 03/05/2016 >60  >60 mL/min Final   Comment: (NOTE) The eGFR has been calculated using the CKD EPI equation. This calculation has not been validated in all clinical situations. eGFR's persistently <60 mL/min signify possible Chronic Kidney Disease.   . Anion gap 03/05/2016 6  5 - 15 Final    Assessment:  [name] [name] is a 76 y.o. female ***  Plan: 1. *** 2. *** 3. *** 4. *** 5. ***  Lequita Asal, MD  06/25/2016, 6:36 AM

## 2016-06-26 ENCOUNTER — Ambulatory Visit (INDEPENDENT_AMBULATORY_CARE_PROVIDER_SITE_OTHER): Payer: Medicare HMO | Admitting: Internal Medicine

## 2016-06-26 ENCOUNTER — Inpatient Hospital Stay: Payer: Medicare HMO | Attending: Hematology and Oncology | Admitting: Hematology and Oncology

## 2016-06-26 ENCOUNTER — Encounter: Payer: Self-pay | Admitting: *Deleted

## 2016-06-26 ENCOUNTER — Encounter: Payer: Self-pay | Admitting: Internal Medicine

## 2016-06-26 VITALS — BP 128/73 | HR 92 | Temp 95.7°F | Resp 18 | Wt 114.8 lb

## 2016-06-26 VITALS — BP 120/78 | HR 90 | Resp 16 | Ht 62.0 in | Wt 114.6 lb

## 2016-06-26 DIAGNOSIS — Z880 Allergy status to penicillin: Secondary | ICD-10-CM | POA: Diagnosis not present

## 2016-06-26 DIAGNOSIS — I110 Hypertensive heart disease with heart failure: Secondary | ICD-10-CM | POA: Insufficient documentation

## 2016-06-26 DIAGNOSIS — I700 Atherosclerosis of aorta: Secondary | ICD-10-CM | POA: Diagnosis not present

## 2016-06-26 DIAGNOSIS — C50912 Malignant neoplasm of unspecified site of left female breast: Secondary | ICD-10-CM

## 2016-06-26 DIAGNOSIS — Z7952 Long term (current) use of systemic steroids: Secondary | ICD-10-CM

## 2016-06-26 DIAGNOSIS — Z9981 Dependence on supplemental oxygen: Secondary | ICD-10-CM

## 2016-06-26 DIAGNOSIS — C3432 Malignant neoplasm of lower lobe, left bronchus or lung: Secondary | ICD-10-CM | POA: Diagnosis not present

## 2016-06-26 DIAGNOSIS — Z853 Personal history of malignant neoplasm of breast: Secondary | ICD-10-CM | POA: Insufficient documentation

## 2016-06-26 DIAGNOSIS — Z9223 Personal history of estrogen therapy: Secondary | ICD-10-CM

## 2016-06-26 DIAGNOSIS — Z170 Estrogen receptor positive status [ER+]: Secondary | ICD-10-CM | POA: Diagnosis not present

## 2016-06-26 DIAGNOSIS — Z881 Allergy status to other antibiotic agents status: Secondary | ICD-10-CM

## 2016-06-26 DIAGNOSIS — M797 Fibromyalgia: Secondary | ICD-10-CM | POA: Insufficient documentation

## 2016-06-26 DIAGNOSIS — D638 Anemia in other chronic diseases classified elsewhere: Secondary | ICD-10-CM | POA: Diagnosis not present

## 2016-06-26 DIAGNOSIS — Z923 Personal history of irradiation: Secondary | ICD-10-CM | POA: Diagnosis not present

## 2016-06-26 DIAGNOSIS — R918 Other nonspecific abnormal finding of lung field: Secondary | ICD-10-CM | POA: Insufficient documentation

## 2016-06-26 DIAGNOSIS — I509 Heart failure, unspecified: Secondary | ICD-10-CM

## 2016-06-26 DIAGNOSIS — G629 Polyneuropathy, unspecified: Secondary | ICD-10-CM | POA: Diagnosis not present

## 2016-06-26 DIAGNOSIS — F17210 Nicotine dependence, cigarettes, uncomplicated: Secondary | ICD-10-CM | POA: Diagnosis not present

## 2016-06-26 DIAGNOSIS — N390 Urinary tract infection, site not specified: Secondary | ICD-10-CM | POA: Diagnosis not present

## 2016-06-26 DIAGNOSIS — J449 Chronic obstructive pulmonary disease, unspecified: Secondary | ICD-10-CM

## 2016-06-26 DIAGNOSIS — M069 Rheumatoid arthritis, unspecified: Secondary | ICD-10-CM | POA: Insufficient documentation

## 2016-06-26 DIAGNOSIS — L89323 Pressure ulcer of left buttock, stage 3: Secondary | ICD-10-CM | POA: Diagnosis not present

## 2016-06-26 DIAGNOSIS — Z9221 Personal history of antineoplastic chemotherapy: Secondary | ICD-10-CM

## 2016-06-26 DIAGNOSIS — Z87440 Personal history of urinary (tract) infections: Secondary | ICD-10-CM

## 2016-06-26 DIAGNOSIS — Z79899 Other long term (current) drug therapy: Secondary | ICD-10-CM | POA: Insufficient documentation

## 2016-06-26 MED ORDER — TIOTROPIUM BROMIDE MONOHYDRATE 2.5 MCG/ACT IN AERS: 2.5000 ug | INHALATION_SPRAY | Freq: Two times a day (BID) | RESPIRATORY_TRACT | 6 refills | Status: DC

## 2016-06-26 MED ORDER — TIOTROPIUM BROMIDE MONOHYDRATE 2.5 MCG/ACT IN AERS: 2.5000 ug | INHALATION_SPRAY | Freq: Two times a day (BID) | RESPIRATORY_TRACT | 0 refills | Status: DC

## 2016-06-26 MED ORDER — PREDNISONE 20 MG PO TABS: 40.0000 mg | ORAL_TABLET | Freq: Every day | ORAL | 0 refills | Status: DC

## 2016-06-26 NOTE — Progress Notes (Addendum)
[facility] day:  06/26/2016  Chief Complaint: [name] [name] is a 76 y.o. female with a history of stage IA breast cancer who is seen for assessment of a left lower lobe mass.  HPI: The patient has a history of breast cancer.  She was diagnosed with stage IA (T1cN0M0) left breast cancer in 02/2005.  She underwent lumpectomy.  Tumor was ER + , PR +, and Her2/neu -.  Two sentinel lymph nodes were negative.  She received 4 cycles of Adriamycin and Cytoxan (AC) and possibly an abbreviated course of Taxol (no records available).  She received radiation.  She completed Femara in 08/2010.  She was discharged from clinic by Dr. Oliva Bustard on 05/11/2015 as she was 10 years post diagnosis and treatment.  She has a history of tobacco use.  She smokes 8 cigarettes/day.  She has been on home oxygen for 1 1/2 years.  She presented with left lower chest wall pain.  She was seen by Dr. Ginette Pitman.  Chest CT was ordered.  Chest CT with contrast on 06/11/2016 revealed a 3.2 x 3.0 mass like area of focal opacity in the medial left lower lobe with obliteration of segmental airways to the anterior left lower lobe. There was a small pleural effusion.  There was advanced thoracoabdominal aortic atherosclerosis with fusiform dilatation of the descending thoracic aorta up to 4.3 cm, similar to 11/14/2015.  She was seen by Dr. Genevive Bi on 06/20/2016.  Notes indicate that she was likely not a surgical candidate.  It was recommended that she undergo bronchoscopy and biopsy.  Dr. Flora Lipps was consulted.  She is scheduled to have a bronchoscopy by Dr Alva Garnet on 07/01/2016.  PET scan is scheduled for tomorrow.  She was seen by Dr. Baruch Gouty on 06/24/2016.  She was felt to [facility] have locally advanced lung cancer. Recommendations were pending results of biopsy (radiation versus concurrent chemotherapy and radiation).  She is followed by Dr. Lucky Cowboy for vascular concerns. She has severe atherosclerosis  with aneurysmal dilatation of the aorta.   Symptomatically, she feels "terrible".  She has fibromyalgia.  She states that gabapentin helps her walk.   Past Medical History:  Diagnosis Date  . Asthma   . Breast cancer ([name]) 2009   left  . Cancer ([name] [name])   . CHF (congestive heart failure) ([name] [name])   . CHF (congestive heart failure) ([place])   . Chronic UTI   . COPD (chronic obstructive pulmonary disease) ([name] [name])   . Dizziness   . Fibromyalgia   . Hypertension   . Neuropathy   . Personal history of tobacco use, presenting hazards to health 05/17/2015  . Polyp, larynx   . RA (rheumatoid arthritis) ([name] [name])   . Sinus infection    recent  . Stumbling gait    to the left  . Supplemental oxygen dependent    2.5l    Past Surgical History:  Procedure Laterality Date  . ABDOMINAL HYSTERECTOMY    . BREAST LUMPECTOMY Left 2009   chemo and radiation  . CYST EXCISION Left 02/27/2015   Procedure: CYST REMOVAL;  Surgeon: Hessie Knows, MD;  Location: [facility] ORS;  Service: Orthopedics;  Laterality: Left;  . EYE MUSCLE SURGERY Right    13 surgeries  . THUMB ARTHROSCOPY Left     Family History  Problem Relation Age of Onset  . Diabetes Father   . Stroke Father   . Heart attack Father   . CAD Sister  Social History:  reports that she has been smoking Cigarettes.  She has a 40.00 pack-year smoking history. She has never used smokeless tobacco. She reports that she does not drink alcohol or use drugs.  The patient is accompanied by [name], a friend, and Campbell, RN, nurse navigator, today.  Allergies:  Allergies  Allergen Reactions  . Contrast Media [Iodinated Diagnostic Agents] Other (See Comments)    Pt was sent to the ED following contrast media injection at [place]. Unknown reason. She has been premedicated since without complications. Pt to be premedicated prior to contrast media injections  . Ioxaglate Other (See Comments)    Pt was sent to the ED following contrast  media injection at [name]. Unknown reason. She has been premedicated since without complications. Pt to be premedicated prior to contrast media injections  . Sulfa Antibiotics     Other reaction(s): Other (See Comments)  . Tetracycline Hives and Other (See Comments)  . White Petrolatum Other (See Comments)  . Amoxicillin-Pot Clavulanate Rash    Other reaction(s): Unknown Other reaction(s): Unknown Blisters in mouth Blisters in mouth Blisters in mouth Blisters in mouth Blisters in mouth Other reaction(s): Unknown Blisters in mouth Blisters in mouth  . Tape Rash    Current Medications: Current Outpatient Prescriptions  Medication Sig Dispense Refill  . Calcium-Vitamin D 600-200 MG-UNIT tablet Take 1 tablet by mouth daily.     . cholecalciferol (VITAMIN D) 1000 units tablet Take 1,000 Units by mouth daily.    . clonazePAM (KLONOPIN) 1 MG tablet Take 1 mg by mouth 2 (two) times daily as needed for anxiety.     . gabapentin (NEURONTIN) 600 MG tablet Take 600 mg by mouth 3 (three) times daily.     [name] HYDROcodone-acetaminophen (NORCO/VICODIN) 5-325 MG tablet Take 1-2 tablets by mouth every 4 (four) hours as needed for moderate pain.     . Multiple Vitamin (MULTIVITAMIN) capsule Take 1 capsule by mouth daily.    . predniSONE (DELTASONE) 20 MG tablet Take 2 tablets (40 mg total) by mouth daily with breakfast. 10 days 20 tablet 0  . Tiotropium Bromide Monohydrate (SPIRIVA RESPIMAT) 2.5 MCG/ACT AERS Inhale 2.5 mcg into the lungs 2 (two) times daily. 1 Inhaler 0  . traMADol (ULTRAM) 50 MG tablet Take 100 mg by mouth every 8 (eight) hours.     . vitamin C (ASCORBIC ACID) 500 MG tablet Take 500 mg by mouth daily.    [name] ZINC SULFATE PO Take 1 tablet by mouth daily.     [name] zolpidem (AMBIEN) 5 MG tablet Take 5 mg by mouth at bedtime.    . carvedilol (COREG) 12.5 MG tablet Take 1 tablet (12.5 mg total) by mouth 2 (two) times daily with a meal. 60 tablet 0  . imipramine (TOFRANIL) 25 MG  tablet Take 1 tablet by mouth at bedtime.     . iron polysaccharides (FERREX 150) 150 MG capsule Take 150 mg by mouth daily.     No current facility-administered medications for this visit.     Review of Systems:  GENERAL:  Feels terrible.  No fevers, sweats or weight loss. PERFORMANCE STATUS (ECOG):  2 HEENT:  No visual changes, runny nose, sore throat, mouth sores or tenderness. Lungs: Shortness of breath.  Cough.  No hemoptysis.  On oxygen 2 liter/min visa [place]. Cardiac:  No chest pain, palpitations, orthopnea, or PND. GI:  No nausea, vomiting, diarrhea, constipation, melena or hematochezia. GU:  No urgency, frequency, dysuria, or hematuria.  Recent UTI. Musculoskeletal:  Fibromyalgia.  Osteoporosis.  No back pain.  No joint pain.  No muscle tenderness. Extremities:  No pain or swelling. Skin:  Decubitus.  Heel eschar.  No rashes or skin changes. Neuro:  No headache, numbness or weakness, balance or coordination issues. Endocrine:  No diabetes, thyroid issues, hot flashes or night sweats. Psych:  No mood changes, depression or anxiety. Pain:  No focal pain. Review of systems:  All other systems reviewed and found to be negative.  Physical Exam: Blood pressure 128/73, pulse 92, temperature (!) 95.7 F (35.4 C), temperature source Tympanic, resp. rate 18, weight 114 lb 12.8 oz (52.1 kg). GENERAL:  Thin woman sitting comfortably in a wheelchair in the exam room in no acute distress. MENTAL STATUS:  Alert and oriented to person, place and time. HEAD:  Short blonde hair.  Normocephalic, atraumatic, face symmetric, no Cushingoid features. EYES:  Blue eyes.  Pupils equal round and reactive to light and accomodation.  No conjunctivitis or scleral icterus. ENT:  Oropharynx clear without lesion.  Tongue normal. Mucous membranes moist.  RESPIRATORY:  Coarse breath sounds.  No rales, wheezes or rhonchi. CARDIOVASCULAR:  Regular rate and rhythm without murmur, rub or gallop. ABDOMEN:  Soft,  non-tender, with active bowel sounds, and no hepatosplenomegaly.  No masses. SKIN:  No rashes, ulcers or lesions. EXTREMITIES: Arthritic changes in hands.  No edema, no skin discoloration or tenderness.  No palpable cords. LYMPH NODES: No palpable cervical, supraclavicular, axillary or inguinal adenopathy  NEUROLOGICAL: Unremarkable. PSYCH:  Appropriate.   No visits with results within 3 Day(s) from this visit.  Latest known visit with results is:  Admission on 03/04/2016, Discharged on 03/06/2016  Component Date Value Ref Range Status  . Sodium 03/04/2016 128* 135 - 145 mmol/L Final  . Potassium 03/04/2016 4.2  3.5 - 5.1 mmol/L Final  . Chloride 03/04/2016 92* 101 - 111 mmol/L Final  . CO2 03/04/2016 31  22 - 32 mmol/L Final  . Glucose, Bld 03/04/2016 95  65 - 99 mg/dL Final  . BUN 03/04/2016 13  6 - 20 mg/dL Final  . Creatinine, Ser 03/04/2016 1.11* 0.44 - 1.00 mg/dL Final  . Calcium 03/04/2016 8.5* 8.9 - 10.3 mg/dL Final  . Total Protein 03/04/2016 6.8  6.5 - 8.1 g/dL Final  . Albumin 03/04/2016 3.2* 3.5 - 5.0 g/dL Final  . AST 03/04/2016 20  15 - 41 U/L Final  . ALT 03/04/2016 7* 14 - 54 U/L Final  . Alkaline Phosphatase 03/04/2016 92  38 - 126 U/L Final  . Total Bilirubin 03/04/2016 0.2* 0.3 - 1.2 mg/dL Final  . GFR calc non Af Amer 03/04/2016 48* >60 mL/min Final  . GFR calc Af Amer 03/04/2016 56* >60 mL/min Final   Comment: (NOTE) The eGFR has been calculated using the CKD EPI equation. This calculation has not been validated in all clinical situations. eGFR's persistently <60 mL/min signify possible Chronic Kidney Disease.   . Anion gap 03/04/2016 5  5 - 15 Final  . WBC 03/04/2016 5.9  3.6 - 11.0 K/uL Final  . RBC 03/04/2016 3.28* 3.80 - 5.20 MIL/uL Final  . Hemoglobin 03/04/2016 9.9* 12.0 - 16.0 g/dL Final  . HCT 03/04/2016 29.2* 35.0 - 47.0 % Final  . MCV 03/04/2016 89.0  80.0 - 100.0 fL Final  . MCH 03/04/2016 30.1  26.0 - 34.0 pg Final  . MCHC 03/04/2016 33.8   32.0 - 36.0 g/dL Final  . RDW 03/04/2016 16.3* 11.5 - 14.5 %  Final  . Platelets 03/04/2016 250  150 - 440 K/uL Final  . Color, Urine 03/04/2016 YELLOW* YELLOW Final  . APPearance 03/04/2016 CLEAR* CLEAR Final  . Specific Gravity, Urine 03/04/2016 1.006  1.005 - 1.030 Final  . pH 03/04/2016 6.0  5.0 - 8.0 Final  . Glucose, UA 03/04/2016 NEGATIVE  NEGATIVE mg/dL Final  . Hgb urine dipstick 03/04/2016 NEGATIVE  NEGATIVE Final  . Bilirubin Urine 03/04/2016 NEGATIVE  NEGATIVE Final  . Ketones, ur 03/04/2016 NEGATIVE  NEGATIVE mg/dL Final  . Protein, ur 03/04/2016 NEGATIVE  NEGATIVE mg/dL Final  . Nitrite 03/04/2016 POSITIVE* NEGATIVE Final  . Leukocytes, UA 03/04/2016 SMALL* NEGATIVE Final  . RBC / HPF 03/04/2016 0-5  0 - 5 RBC/hpf Final  . WBC, UA 03/04/2016 6-30  0 - 5 WBC/hpf Final  . Bacteria, UA 03/04/2016 MANY* NONE SEEN Final  . Squamous Epithelial / LPF 03/04/2016 0-5* NONE SEEN Final  . Mucous 03/04/2016 PRESENT   Final  . Specimen Description 03/04/2016 URINE, RANDOM   Final  . Special Requests 03/04/2016 NONE   Final  . Culture 03/04/2016 *  Final                   Value:80,000 COLONIES/mL ESCHERICHIA COLI Confirmed Extended Spectrum Beta-Lactamase Producer (ESBL) Performed at [facility], [place] [address], [place], [place] [address]   . Report Status 03/04/2016 03/06/2016 FINAL   Final  . Organism ID, Bacteria 03/04/2016 ESCHERICHIA COLI*  Final  . WBC 03/05/2016 4.6  3.6 - 11.0 K/uL Final  . RBC 03/05/2016 3.30* 3.80 - 5.20 MIL/uL Final  . Hemoglobin 03/05/2016 9.9* 12.0 - 16.0 g/dL Final  . HCT 03/05/2016 29.2* 35.0 - 47.0 % Final  . MCV 03/05/2016 88.6  80.0 - 100.0 fL Final  . MCH 03/05/2016 30.0  26.0 - 34.0 pg Final  . MCHC 03/05/2016 33.8  32.0 - 36.0 g/dL Final  . RDW 03/05/2016 16.5* 11.5 - 14.5 % Final  . Platelets 03/05/2016 251  150 - 440 K/uL Final  . Sodium 03/05/2016 136  135 - 145 mmol/L Final  . Potassium 03/05/2016 4.2  3.5 - 5.1 mmol/L Final  .  Chloride 03/05/2016 102  101 - 111 mmol/L Final  . CO2 03/05/2016 28  22 - 32 mmol/L Final  . Glucose, Bld 03/05/2016 94  65 - 99 mg/dL Final  . BUN 03/05/2016 10  6 - 20 mg/dL Final  . Creatinine, Ser 03/05/2016 1.02* 0.44 - 1.00 mg/dL Final  . Calcium 03/05/2016 8.3* 8.9 - 10.3 mg/dL Final  . GFR calc non Af Amer 03/05/2016 53* >60 mL/min Final  . GFR calc Af Amer 03/05/2016 >60  >60 mL/min Final   Comment: (NOTE) The eGFR has been calculated using the CKD EPI equation. This calculation has not been validated in all clinical situations. eGFR's persistently <60 mL/min signify possible Chronic Kidney Disease.   . Anion gap 03/05/2016 6  5 - 15 Final    Assessment:  [name] [name] is a 77 y.o. female with a 40 pack year smoking history and a left lower lobe mass.  She presented with left lower chest wall pain.  Chest CT with contrast on 06/11/2016 revealed a 3.2 x 3.0 mass like area of focal opacity in the medial left lower lobe with obliteration of segmental airways to the anterior left lower lobe.  She has a history of stage IA left breast cancer in 02/2005.  She underwent lumpectomy.  Pathology revealed a T1cN0 lesion.  Tumor was ER + , PR +, and Her2/neu -.  Two sentinel lymph nodes were negative.  She received chemotherapy (4 cycles of AC and possibly an abbreviated course of Taxol- no records available).  She received radiation.  She completed Femara in 08/2010.  She has fibromyalgia.  She has advanced thoracoabdominal aortic atherosclerosis with fusiform dilatation of the descending thoracic aorta up to 4.3 cm.  She is followed by Dr Lucky Cowboy.  Symptomatically, she feels terrible.  She has been oxygen dependent for 1 1/2 years.  Plan: 1.  Discuss past medical history including diagnosis, staging and management of breast cancer.  Discuss left lower lobe lesion likely secondary to underlying lung cancer.  Discuss need for tissue diagnosis.  Patient is scheduled to undergo bronchoscopy on  07/01/2016.  Discuss PET scan for staging scheduled for 06/27/2016.  Discuss prior consultations (surgery, radiation oncology, and pulmonary medicine).  Treatment based on pathology and staging studies. 2.  PET scan on 06/27/2016. 3.  Bronchoscopy on 07/01/2016. 4.  RTC in 1 week for MD assessment and review of PET scan and biopsy.   Lequita Asal, MD  06/26/2016

## 2016-06-26 NOTE — Patient Instructions (Addendum)
Continue Symbicort Start Spiriva Respimat Start prednisone 40 mg daily for 5 days Check PFT's   Plan for Bronchoscopy via [facility]

## 2016-06-26 NOTE — Progress Notes (Signed)
Name: [name] [name] MRN: [id number] DOB: 08-29-1940     CONSULTATION DATE: 06/26/2016  REFERRING MD :  Graciela Husbands  CHIEF COMPLAINT: Abnormal CT chest   HISTORY OF PRESENT ILLNESS:   76 year old female with abnormal CT scan findings Has been developing some left lower lobe chest wall pain The pain has been constant without any alleviating or exacerbating factors She has been a lifelong smoker She does remove her oxygen when she smokes patient has diagnosis of chronic hypoxic respiratory failure She has been on oxygen for about one year She has chronic shortness of breath and intermittent wheezing she uses Symbicort inhaler as needed her restrictive symptoms are much improved with oxygen therapy   She also has a history of rheumatoid arthritis and history of breast cancer which has been managed by oncology She had a previous lumpectomy and underwent radiation and chemotherapy She also has severe atherosclerosis with aneurysmal dilation of the aorta and sees a vascular surgery for this   At this time she has no acute symptoms no acute shortness of breath No signs of respiratory distress No signs of infection at this time No signs of congestive heart failure at this time  With regards to her lung mass I have explained that she will need further diagnostic evaluation The risks of the procedure are increased in her situation  The Risks and Benefits of the Bronchoscopy were explained to patient/family and I have discussed the risk for acute bleeding, increased chance of infection, increased chance of respiratory failure and cardiac arrest and death. I have also explained to avoid all types of NSAIDs to decrease chance of bleeding, and to avoid food and drinks the midnight prior to procedure.  The procedure consists of a video camera with a light source to be placed and inserted  into the lungs to  look for abnormal tissue and to obtain tissue samples by using needle and biopsy  tools.  The patient/family understand the risks and benefits and have agreed to proceed with procedure.    PAST MEDICAL HISTORY :   has a past medical history of Asthma; Breast cancer ([place]) (2009); Cancer [facility]); CHF (congestive heart failure) ([place]); CHF (congestive heart failure) ([place]); Chronic UTI; COPD (chronic obstructive pulmonary disease) ([place]); Dizziness; Fibromyalgia; Hypertension; Neuropathy; Personal history of tobacco use, presenting hazards to health (05/17/2015); Polyp, larynx; RA (rheumatoid arthritis) ([place]); Sinus infection; Stumbling gait; and Supplemental oxygen dependent.  has a past surgical history that includes Eye muscle surgery (Right); Abdominal hysterectomy; Thumb arthroscopy (Left); Cyst excision (Left, 02/27/2015); and Breast lumpectomy (Left, 2009). Prior to Admission medications   Medication Sig Start Date End Date Taking? Authorizing Provider  Calcium-Vitamin D 600-200 MG-UNIT tablet Take by mouth.    [provider]  carvedilol (COREG) 12.5 MG tablet Take 1 tablet (12.5 mg total) by mouth 2 (two) times daily with a meal. 11/29/15   Hugelmeyer, Alexis, DO  cholecalciferol (VITAMIN D) 1000 units tablet Take 1,000 Units by mouth daily.    [provider]  clonazePAM (KLONOPIN) 1 MG tablet Take 1 mg by mouth 2 (two) times daily as needed for anxiety.     [provider]  ertapenem 1 g in sodium chloride 0.9 % 50 mL Inject 1 g into the vein daily. Patient not taking: Reported on 06/24/2016 03/06/16   Theodoro Grist, MD  gabapentin (NEURONTIN) 600 MG tablet Take by mouth. 05/25/12   [provider]  HYDROcodone-acetaminophen (NORCO/VICODIN) 5-325 MG tablet  06/22/16   [provider]  imipramine (TOFRANIL) 25 MG tablet Take 1 tablet by mouth at bedtime.  09/21/15   [provider]  Ipratropium-Albuterol ([place]) Inhale into the lungs.    [provider]  iron polysaccharides (FERREX 150) 150 MG capsule Take 150  mg by mouth daily.    [provider]  Multiple Vitamin (MULTIVITAMIN) capsule Take 1 capsule by mouth daily.    [provider]  traMADol (ULTRAM) 50 MG tablet Take 100 mg by mouth every 8 (eight) hours.     [provider]  vitamin C (ASCORBIC ACID) 500 MG tablet Take 500 mg by mouth daily.    [provider]  ZINC SULFATE PO Take 1 tablet by mouth 1 day or 1 dose.    [provider]  zolpidem (AMBIEN) 5 MG tablet Take 5 mg by mouth at bedtime.    [provider]   Allergies  Allergen Reactions  . Contrast Media [Iodinated Diagnostic Agents] Other (See Comments)    Pt was sent to the ED following contrast media injection at [place]. Unknown reason. She has been premedicated since without complications. Pt to be premedicated prior to contrast media injections  . Ioxaglate Other (See Comments)    Pt was sent to the ED following contrast media injection at [place]. Unknown reason. She has been premedicated since without complications. Pt to be premedicated prior to contrast media injections  . Sulfa Antibiotics     Other reaction(s): Other (See Comments)  . Tetracycline Hives and Other (See Comments)  . White Petrolatum Other (See Comments)  . Amoxicillin-Pot Clavulanate Rash    Other reaction(s): Unknown Other reaction(s): Unknown Blisters in mouth Blisters in mouth Blisters in mouth Blisters in mouth Blisters in mouth Other reaction(s): Unknown Blisters in mouth Blisters in mouth  . Tape Rash    FAMILY HISTORY:  family history includes CAD in her sister; Diabetes in her father; Heart attack in her father; Stroke in her father. SOCIAL HISTORY:  reports that she has been smoking Cigarettes.  She has a 40.00 pack-year smoking history. She has never used smokeless tobacco. She reports that she does not drink alcohol or use drugs.  REVIEW OF SYSTEMS:   Constitutional: Negative for fever, chills, weight loss,  malaise/fatigue and diaphoresis.  HENT: Negative for hearing loss, ear pain, nosebleeds, congestion, sore throat, neck pain, tinnitus and ear discharge.   Eyes: Negative for blurred vision, double vision, photophobia, pain, discharge and redness.  Respiratory: Negative for cough, hemoptysis, sputum production,+ shortness of breath, +wheezing and stridor.   Cardiovascular: Negative for chest pain, palpitations, orthopnea, claudication, leg swelling and PND.  Gastrointestinal: Negative for heartburn, nausea, vomiting, abdominal pain, diarrhea, constipation, blood in stool and melena.  Genitourinary: Negative for dysuria, urgency, frequency, hematuria and flank pain.  Musculoskeletal: Negative for myalgias, back pain, joint pain and falls.  Skin: Negative for itching and rash.  Neurological: Negative for dizziness, tingling, tremors, sensory change, speech change, focal weakness, seizures, loss of consciousness, weakness and headaches.  Endo/Heme/Allergies: Negative for environmental allergies and polydipsia. Does not bruise/bleed easily.  ALL OTHER ROS ARE NEGATIVE   BP 120/78 (BP Location: Left Arm, Cuff Size: Normal)   Pulse 90   Resp 16   Ht 5\' 2"  (1.575 m)   Wt 114 lb 9.6 oz (52 kg)   SpO2 93%   BMI 20.96 kg/m    Physical Examination:   GENERAL:NAD, no fevers, chills, no weakness no fatigue HEAD: Normocephalic, atraumatic.  EYES: Pupils  equal, round, reactive to light. Extraocular muscles intact. No scleral icterus.  MOUTH: Moist mucosal membrane.   EAR, NOSE, THROAT: Clear without exudates. No external lesions.  NECK: Supple. No thyromegaly. No nodules. No JVD.  PULMONARY:CTA B/L no wheezes, no crackles, no rhonchi CARDIOVASCULAR: S1 and S2. Regular rate and rhythm. No murmurs, rubs, or gallops. No edema.  GASTROINTESTINAL: Soft, nontender, nondistended. No masses. Positive bowel sounds.  MUSCULOSKELETAL: No swelling, clubbing, or edema. Range of motion full in all extremities.   NEUROLOGIC: Cranial nerves II through XII are intact. No gross focal neurological deficits.  SKIN: No ulceration, lesions, rashes, or cyanosis. Skin warm and dry. Turgor intact.  PSYCHIATRIC: Mood, affect within normal limits. The patient is awake, alert and oriented x 3. Insight, judgment intact.    CT chest 06/11/16 I have Independently reviewed images of  CT chest   on 06/26/2016 Interpretation: Enlarging left lower lobe lung mass  ASSESSMENT / PLAN: 76 year old white female seen today for abnormal CT scan findings with enlarging left lower lobe lung mass findings consistent with primary lung cancer in the setting of extensive smoking history with underlying COPD with chronic hypoxic respiratory failure in deconditioned state.  #1 COPD with shortness of breath and wheezing Recommend full set of pulmonary function testing -continue symbicort -Start Respimat Inhaler2.5 -Recommend prednisone 40 mg for 10 days in preparation of in the setting of ongoing smoking and shortness of breath and wheezing'  #2 enlarging left lower lobe lung mass highly suggestive of primary lung cancer Recommend undergoing diagnostic evaluation with bronchoscopy  I have explained the patient is at very high risk for postoperative pulmonary and cardiac complications due to her underlying COPD and chronic respiratory failure   #3 tobacco abuse Smoking cessation highly advised   Patient/Family are satisfied with Plan of action and management. All questions answered  follow-up in 4 weeks after procedure has been completed to assess underlying copd status    Corrin Parker, M.D.  [facility]  Medical Director [place] Director [facility] Cardio-Pulmonary Department

## 2016-06-26 NOTE — Progress Notes (Signed)
Here for follow up

## 2016-06-26 NOTE — Progress Notes (Signed)
  Oncology Nurse Navigator Documentation  Navigator Location: [facility] (06/26/16 1400)   )Navigator Encounter Type: Initial MedOnc (06/26/16 1400)                         Barriers/Navigation Needs: No barriers at this time (06/26/16 1400)   Interventions: Coordination of Care (06/26/16 1400)   Coordination of Care: Appts (06/26/16 1400)       met with patient during consultation with Dr. Mike Gip. All questions answered at time of appt. Assisted coordinating follow up appts for patient. All appts reviewed with patient and friend prior to leaving. Pt and friend verbalized understanding.            Time Spent with Patient: 60 (06/26/16 1400)

## 2016-06-26 NOTE — Addendum Note (Signed)
Addended by: Devona Konig on: 06/26/2016 11:13 AM   Modules accepted: Orders

## 2016-06-27 ENCOUNTER — Ambulatory Visit
Admission: RE | Admit: 2016-06-27 | Discharge: 2016-06-27 | Disposition: A | Discharge status: Home / Self Care | Payer: Medicare HMO | Source: Ambulatory Visit | Attending: Internal Medicine | Admitting: Internal Medicine

## 2016-06-27 DIAGNOSIS — I2510 Atherosclerotic heart disease of native coronary artery without angina pectoris: Secondary | ICD-10-CM | POA: Insufficient documentation

## 2016-06-27 DIAGNOSIS — J90 Pleural effusion, not elsewhere classified: Secondary | ICD-10-CM | POA: Insufficient documentation

## 2016-06-27 DIAGNOSIS — J9811 Atelectasis: Secondary | ICD-10-CM | POA: Insufficient documentation

## 2016-06-27 DIAGNOSIS — I77811 Abdominal aortic ectasia: Secondary | ICD-10-CM | POA: Insufficient documentation

## 2016-06-27 DIAGNOSIS — R938 Abnormal findings on diagnostic imaging of other specified body structures: Secondary | ICD-10-CM | POA: Diagnosis not present

## 2016-06-27 DIAGNOSIS — I700 Atherosclerosis of aorta: Secondary | ICD-10-CM | POA: Insufficient documentation

## 2016-06-27 DIAGNOSIS — R911 Solitary pulmonary nodule: Secondary | ICD-10-CM | POA: Insufficient documentation

## 2016-06-27 DIAGNOSIS — R9389 Abnormal findings on diagnostic imaging of other specified body structures: Secondary | ICD-10-CM

## 2016-06-27 DIAGNOSIS — R918 Other nonspecific abnormal finding of lung field: Secondary | ICD-10-CM | POA: Diagnosis not present

## 2016-06-27 LAB — GLUCOSE, CAPILLARY: Glucose-Capillary: 104 mg/dL — ABNORMAL HIGH (ref 65–99)

## 2016-06-27 MED ORDER — FLUDEOXYGLUCOSE F - 18 (FDG) INJECTION
13.0600 | Freq: Once | INTRAVENOUS | Status: AC | PRN
  Administered 2016-06-27: 13.06 via INTRAVENOUS

## 2016-06-28 ENCOUNTER — Encounter: Payer: Self-pay | Admitting: Hematology and Oncology

## 2016-07-01 ENCOUNTER — Ambulatory Visit: Payer: Medicare HMO

## 2016-07-01 ENCOUNTER — Ambulatory Visit
Admission: RE | Admit: 2016-07-01 | Discharge: 2016-07-01 | Disposition: A | Discharge status: Home / Self Care | Payer: Medicare HMO | Source: Ambulatory Visit | Attending: Pulmonary Disease | Admitting: Pulmonary Disease

## 2016-07-01 ENCOUNTER — Encounter: Payer: Self-pay | Admitting: *Deleted

## 2016-07-01 ENCOUNTER — Other Ambulatory Visit: Payer: Self-pay | Admitting: Internal Medicine

## 2016-07-01 ENCOUNTER — Encounter
Admission: RE | Disposition: A | Discharge status: Home / Self Care | Payer: Self-pay | Source: Ambulatory Visit | Attending: Pulmonary Disease

## 2016-07-01 DIAGNOSIS — L89323 Pressure ulcer of left buttock, stage 3: Secondary | ICD-10-CM | POA: Diagnosis not present

## 2016-07-01 DIAGNOSIS — Z87891 Personal history of nicotine dependence: Secondary | ICD-10-CM | POA: Insufficient documentation

## 2016-07-01 DIAGNOSIS — I509 Heart failure, unspecified: Secondary | ICD-10-CM | POA: Diagnosis not present

## 2016-07-01 DIAGNOSIS — R911 Solitary pulmonary nodule: Secondary | ICD-10-CM

## 2016-07-01 DIAGNOSIS — M797 Fibromyalgia: Secondary | ICD-10-CM | POA: Insufficient documentation

## 2016-07-01 DIAGNOSIS — Z9981 Dependence on supplemental oxygen: Secondary | ICD-10-CM | POA: Insufficient documentation

## 2016-07-01 DIAGNOSIS — G629 Polyneuropathy, unspecified: Secondary | ICD-10-CM | POA: Diagnosis not present

## 2016-07-01 DIAGNOSIS — R918 Other nonspecific abnormal finding of lung field: Secondary | ICD-10-CM

## 2016-07-01 DIAGNOSIS — Z79899 Other long term (current) drug therapy: Secondary | ICD-10-CM | POA: Diagnosis not present

## 2016-07-01 DIAGNOSIS — I110 Hypertensive heart disease with heart failure: Secondary | ICD-10-CM | POA: Insufficient documentation

## 2016-07-01 DIAGNOSIS — C3432 Malignant neoplasm of lower lobe, left bronchus or lung: Secondary | ICD-10-CM | POA: Insufficient documentation

## 2016-07-01 DIAGNOSIS — M069 Rheumatoid arthritis, unspecified: Secondary | ICD-10-CM | POA: Insufficient documentation

## 2016-07-01 DIAGNOSIS — J939 Pneumothorax, unspecified: Secondary | ICD-10-CM | POA: Diagnosis not present

## 2016-07-01 DIAGNOSIS — J449 Chronic obstructive pulmonary disease, unspecified: Secondary | ICD-10-CM | POA: Insufficient documentation

## 2016-07-01 DIAGNOSIS — J45909 Unspecified asthma, uncomplicated: Secondary | ICD-10-CM | POA: Insufficient documentation

## 2016-07-01 DIAGNOSIS — Z9889 Other specified postprocedural states: Secondary | ICD-10-CM

## 2016-07-01 DIAGNOSIS — N390 Urinary tract infection, site not specified: Secondary | ICD-10-CM | POA: Diagnosis not present

## 2016-07-01 DIAGNOSIS — D638 Anemia in other chronic diseases classified elsewhere: Secondary | ICD-10-CM | POA: Diagnosis not present

## 2016-07-01 HISTORY — PX: FLEXIBLE BRONCHOSCOPY: SHX5094

## 2016-07-01 SURGERY — BRONCHOSCOPY, FLEXIBLE
Anesthesia: Moderate Sedation

## 2016-07-01 MED ORDER — FENTANYL CITRATE (PF) 100 MCG/2ML IJ SOLN
INTRAMUSCULAR | Status: AC | PRN
  Administered 2016-07-01: 50 ug via INTRAVENOUS
  Administered 2016-07-01: 25 ug via INTRAVENOUS
  Administered 2016-07-01: 50 ug via INTRAVENOUS

## 2016-07-01 MED ORDER — PHENYLEPHRINE HCL 0.25 % NA SOLN
1.0000 | Freq: Four times a day (QID) | NASAL | Status: DC | PRN
  Filled 2016-07-01: qty 15

## 2016-07-01 MED ORDER — FENTANYL CITRATE (PF) 100 MCG/2ML IJ SOLN
INTRAMUSCULAR | Status: AC
  Filled 2016-07-01: qty 4

## 2016-07-01 MED ORDER — BUTAMBEN-TETRACAINE-BENZOCAINE 2-2-14 % EX AERO
1.0000 | INHALATION_SPRAY | Freq: Once | CUTANEOUS | Status: DC
  Filled 2016-07-01: qty 20

## 2016-07-01 MED ORDER — MIDAZOLAM HCL 5 MG/5ML IJ SOLN
INTRAMUSCULAR | Status: AC
  Filled 2016-07-01: qty 10

## 2016-07-01 MED ORDER — EPINEPHRINE PF 1 MG/10ML IJ SOSY
PREFILLED_SYRINGE | INTRAMUSCULAR | Status: AC | PRN
  Administered 2016-07-01: 0.5 mg via INTRAVENOUS

## 2016-07-01 MED ORDER — LIDOCAINE VISCOUS 2 % MT SOLN
OROMUCOSAL | Status: AC
  Filled 2016-07-01: qty 15

## 2016-07-01 MED ORDER — MIDAZOLAM HCL 5 MG/5ML IJ SOLN
INTRAMUSCULAR | Status: AC | PRN
  Administered 2016-07-01 (×3): 2 mg via INTRAVENOUS

## 2016-07-01 MED ORDER — LIDOCAINE HCL 2 % EX GEL: 1.0000 "application " | Freq: Once | CUTANEOUS | Status: DC

## 2016-07-01 NOTE — Discharge Instructions (Signed)
Pneumothorax °A pneumothorax, commonly called a collapsed lung, is a condition in which air leaks from a lung and builds up in the space between the lung and the chest wall (pleural space). The air in a pneumothorax is trapped outside the lung and takes up space, preventing the lung from fully expanding. This is a condition that usually occurs suddenly. The buildup of air may be small or large. A small pneumothorax may go away on its own. When a pneumothorax is larger, it will often require medical treatment and hospitalization. °What are the causes? °A pneumothorax can sometimes happen quickly with no apparent cause. People with underlying lung problems, particularly COPD or emphysema, are at higher risk of pneumothorax. However, pneumothorax can happen quickly even in people with no prior known lung problems. Trauma, surgery, medical procedures, or injury to the chest wall can also cause a pneumothorax. °What are the signs or symptoms? °Sometimes a pneumothorax will have no symptoms. When symptoms are present, they can include: °· Chest pain. °· Shortness of breath. °· Increased rate of breathing. °· Bluish color to your lips or skin (cyanosis). ° °How is this diagnosed? °Pneumothorax is usually diagnosed by a chest X-ray or chest CT scan. Your health care provider will also take a medical history and perform a physical exam to determine why you may have a pneumothorax. °How is this treated? °A small pneumothorax may go away on its own without treatment. Extra oxygen can sometimes help a small pneumothorax go away more quickly. For a larger pneumothorax or a pneumothorax that is causing symptoms, a procedure is usually needed to drain the air. In some cases, the health care provider may drain the air using a needle. In other cases, a chest tube may be inserted into the pleural space. A chest tube is a small tube placed between the ribs and into the pleural space. This removes the extra air and allows the lung to  expand back to its normal size. A large pneumothorax will usually require a hospital stay. If there is ongoing air leakage into the pleural space, then the chest tube may need to remain in place for several days until the air leak has healed. In some cases, surgery may be needed. °Follow these instructions at home: °· Only take over-the-counter or prescription medicines as directed by your health care provider. °· If a cough or pain makes it difficult for you to sleep at night, try sleeping in a semi-upright position in a recliner or by using 2 or 3 pillows. °· Rest and limit activity as directed by your health care provider. °· If you had a chest tube and it was removed, ask your health care provider when it is okay to remove the dressing. Until your health care provider says you can remove the dressing, do not allow it to get wet. °· Do not smoke. Smoking is a risk factor for pneumothorax. °· Do not fly in an airplane or scuba dive until your health care provider says it is okay. °· Follow up with your health care provider as directed. °Get help right away if: °· You have increasing chest pain or shortness of breath. °· You have a cough that is not controlled with suppressants. °· You begin coughing up blood. °· You have pain that is getting worse or is not controlled with medicines. °· You cough up thick, discolored mucus (sputum) that is yellow to green in color. °· You have redness, increasing pain, or discharge at the site where a   chest tube had been in place (if your pneumothorax was treated with a chest tube).  The site where your chest tube was located opens up.  You feel air coming out of the site where the chest tube was placed.  You have a fever or persistent symptoms for more than 2-3 days.  You have a fever and your symptoms suddenly get worse.    Needle Biopsy of the Lung, Care After This sheet gives you information about how to care for yourself after your procedure. Your health care  provider may also give you more specific instructions. If you have problems or questions, contact your health care provider. What can I expect after the procedure? After the procedure, it is common to have:  Soreness, pain, and tenderness where a tissue sample was taken (biopsy site).  A cough.  A sore throat.  Follow these instructions at home: Biopsy site care  Follow instructions from your health care provider about when to remove the bandage that was placed on the biopsy site.  Keep the bandage dry until it has been removed.  Check your biopsy site every day for signs of infection. Check for: ? More redness, swelling, or pain. ? More fluid or blood. ? Warmth to the touch. ? Pus or a bad smell. General instructions  Rest as directed by your health care provider. Ask your health care provider what activities are safe for you.  Do not take baths, swim, or use a hot tub until your health care provider approves.  Take over-the-counter and prescription medicines only as told by your health care provider.  If you have airplane travel scheduled, talk with your health care provider about when it is safe for you to travel by airplane.  It is up to you to get the results of your procedure. Ask your health care provider, or the department that is doing the procedure, when your results will be ready.  Keep all follow-up visits as told by your health care provider. This is important. Contact a health care provider if:  You have more redness, swelling, or pain around your biopsy site.  You have more fluid or blood coming from your biopsy site.  Your biopsy site feels warm to the touch.  You have pus or a bad smell coming from your biopsy site.  You have a fever.  You have pain that does not get better with medicine. Get help right away if:  You have problems breathing.  You have chest pain.  You cough up blood.  You faint.  You have a fast heart rate. Summary  After  a needle biopsy of the lung, it is common to have a cough, a sore throat, or soreness, pain, and tenderness where a tissue sample was taken (biopsy site).  You should check your biopsy area every day for signs of infection, including pus or a bad smell, warmth, more fluid or blood, or more redness, swelling, or pain.  You should not take baths, swim, or use a hot tub until your health care provider approves. This information is not intended to replace advice given to you by your health care provider. Make sure you discuss any questions you have with your health care provider.    In the first 24 hours after the procedure you may have fevers, chills, and may cough up some blood, this is normal in the first 24 hours. You may take tylenol (acetaminophen) for fever or chills. If the fever or the coughing up  blood is not improving in 24 hours, please call our office as you may need an antibiotic.  If you develop sudden chest pain or severe breathing difficulty, go to the ER or call 911.

## 2016-07-01 NOTE — H&P (Signed)
Flora Lipps, MD Physician Signed Pulmonology  Progress Notes Encounter Date: 06/26/2016  Related encounter: Office Visit from 06/26/2016 in [place]       [] Hide copied text [] Hover for attribution information   Name: [name] [name] MRN:   [id number] DOB:   Oct 31, 1940            Update: no new issues at this time, will proceed with bronchoscopy.    HISTORY OF PRESENT ILLNESS:   76 year old female with abnormal CT scan findings Has been developing some left lower lobe chest wall pain The pain has been constant without any alleviating or exacerbating factors She has been a lifelong smoker She does remove her oxygen when she smokes patient has diagnosis of chronic hypoxic respiratory failure She has been on oxygen for about one year She has chronic shortness of breath and intermittent wheezing she uses Symbicort inhaler as needed her restrictive symptoms are much improved with oxygen therapy   She also has a history of rheumatoid arthritis and history of breast cancer which has been managed by oncology She had a previous lumpectomy and underwent radiation and chemotherapy She also has severe atherosclerosis with aneurysmal dilation of the aorta and sees a vascular surgery for this   At this time she has no acute symptoms no acute shortness of breath No signs of respiratory distress No signs of infection at this time No signs of congestive heart failure at this time  With regards to her lung mass I have explained that she will need further diagnostic evaluation The risks of the procedure are increased in her situation  The Risks and Benefits of the Bronchoscopy were explained to patient/family and I have discussed the risk for acute bleeding, increased chance of infection, increased chance of respiratory failure and cardiac arrest and death. I have also explained to avoid all types of NSAIDs to decrease chance of bleeding, and to avoid food and drinks  the midnight prior to procedure.  The procedure consists of a video camera with a light source to be placed and inserted  into the lungs to  look for abnormal tissue and to obtain tissue samples by using needle and biopsy tools.  The patient/family understand the risks and benefits and have agreed to proceed with procedure.    PAST MEDICAL HISTORY :   has a past medical history of Asthma; Breast cancer ([place]) (2009); Cancer [facility]); CHF (congestive heart failure) ([place]); CHF (congestive heart failure) ([place]); Chronic UTI; COPD (chronic obstructive pulmonary disease) ([place]); Dizziness; Fibromyalgia; Hypertension; Neuropathy; Personal history of tobacco use, presenting hazards to health (05/17/2015); Polyp, larynx; RA (rheumatoid arthritis) ([place]); Sinus infection; Stumbling gait; and Supplemental oxygen dependent.  has a past surgical history that includes Eye muscle surgery (Right); Abdominal hysterectomy; Thumb arthroscopy (Left); Cyst excision (Left, 02/27/2015); and Breast lumpectomy (Left, 2009).        Prior to Admission medications   Medication Sig Start Date End Date Taking? Authorizing Provider  Calcium-Vitamin D 600-200 MG-UNIT tablet Take by mouth.    [provider]  carvedilol (COREG) 12.5 MG tablet Take 1 tablet (12.5 mg total) by mouth 2 (two) times daily with a meal. 11/29/15   Hugelmeyer, Alexis, DO  cholecalciferol (VITAMIN D) 1000 units tablet Take 1,000 Units by mouth daily.    [provider]  clonazePAM (KLONOPIN) 1 MG tablet Take 1 mg by mouth 2 (two) times daily as needed for anxiety.     [provider]  ertapenem 1 g in sodium  chloride 0.9 % 50 mL Inject 1 g into the vein daily. Patient not taking: Reported on 06/24/2016 03/06/16   Theodoro Grist, MD  gabapentin (NEURONTIN) 600 MG tablet Take by mouth. 05/25/12   [provider]  HYDROcodone-acetaminophen (NORCO/VICODIN) 5-325 MG tablet  06/22/16   [provider]    imipramine (TOFRANIL) 25 MG tablet Take 1 tablet by mouth at bedtime.  09/21/15   [provider]  Ipratropium-Albuterol ([place]) Inhale into the lungs.    [provider]  iron polysaccharides (FERREX 150) 150 MG capsule Take 150 mg by mouth daily.    [provider]  Multiple Vitamin (MULTIVITAMIN) capsule Take 1 capsule by mouth daily.    [provider]  traMADol (ULTRAM) 50 MG tablet Take 100 mg by mouth every 8 (eight) hours.     [provider]  vitamin C (ASCORBIC ACID) 500 MG tablet Take 500 mg by mouth daily.    [provider]  ZINC SULFATE PO Take 1 tablet by mouth 1 day or 1 dose.    [provider]  zolpidem (AMBIEN) 5 MG tablet Take 5 mg by mouth at bedtime.    [provider]        Allergies  Allergen Reactions  . Contrast Media [Iodinated Diagnostic Agents] Other (See Comments)    Pt was sent to the ED following contrast media injection at [place]. Unknown reason. She has been premedicated since without complications. Pt to be premedicated prior to contrast media injections  . Ioxaglate Other (See Comments)    Pt was sent to the ED following contrast media injection at [place]. Unknown reason. She has been premedicated since without complications. Pt to be premedicated prior to contrast media injections  . Sulfa Antibiotics     Other reaction(s): Other (See Comments)  . Tetracycline Hives and Other (See Comments)  . White Petrolatum Other (See Comments)  . Amoxicillin-Pot Clavulanate Rash    Other reaction(s): Unknown Other reaction(s): Unknown Blisters in mouth Blisters in mouth Blisters in mouth Blisters in mouth Blisters in mouth Other reaction(s): Unknown Blisters in mouth Blisters in mouth  . Tape Rash    FAMILY HISTORY:  family history includes CAD in her sister; Diabetes in her father; Heart attack in her father; Stroke in  her father. SOCIAL HISTORY:  reports that she has been smoking Cigarettes.  She has a 40.00 pack-year smoking history. She has never used smokeless tobacco. She reports that she does not drink alcohol or use drugs.  REVIEW OF SYSTEMS:   Constitutional: Negative for fever, chills, weight loss, malaise/fatigue and diaphoresis.  HENT: Negative for hearing loss, ear pain, nosebleeds, congestion, sore throat, neck pain, tinnitus and ear discharge.   Eyes: Negative for blurred vision, double vision, photophobia, pain, discharge and redness.  Respiratory: Negative for cough, hemoptysis, sputum production,+ shortness of breath, +wheezing and stridor.   Cardiovascular: Negative for chest pain, palpitations, orthopnea, claudication, leg swelling and PND.  Gastrointestinal: Negative for heartburn, nausea, vomiting, abdominal pain, diarrhea, constipation, blood in stool and melena.  Genitourinary: Negative for dysuria, urgency, frequency, hematuria and flank pain.  Musculoskeletal: Negative for myalgias, back pain, joint pain and falls.  Skin: Negative for itching and rash.  Neurological: Negative for dizziness, tingling, tremors, sensory change, speech change, focal weakness, seizures, loss of consciousness, weakness and headaches.  Endo/Heme/Allergies: Negative for environmental allergies and polydipsia. Does not bruise/bleed easily.  ALL OTHER ROS ARE NEGATIVE   BP 120/78 (BP Location: Left Arm,  Cuff Size: Normal)   Pulse 90   Resp 16   Ht 5\' 2"  (1.575 m)   Wt 114 lb 9.6 oz (52 kg)   SpO2 93%   BMI 20.96 kg/m    Physical Examination:   GENERAL:NAD, no fevers, chills, no weakness no fatigue HEAD: Normocephalic, atraumatic.  EYES: Pupils equal, round, reactive to light. Extraocular muscles intact. No scleral icterus.  MOUTH: Moist mucosal membrane.   EAR, NOSE, THROAT: Clear without exudates. No external lesions.  NECK: Supple. No thyromegaly. No nodules. No JVD.  PULMONARY:CTA B/L  no wheezes, no crackles, no rhonchi CARDIOVASCULAR: S1 and S2. Regular rate and rhythm. No murmurs, rubs, or gallops. No edema.  GASTROINTESTINAL: Soft, nontender, nondistended. No masses. Positive bowel sounds.  MUSCULOSKELETAL: No swelling, clubbing, or edema. Range of motion full in all extremities.  NEUROLOGIC: Cranial nerves II through XII are intact. No gross focal neurological deficits.  SKIN: No ulceration, lesions, rashes, or cyanosis. Skin warm and dry. Turgor intact.  PSYCHIATRIC: Mood, affect within normal limits. The patient is awake, alert and oriented x 3. Insight, judgment intact.    CT chest 06/11/16 I have Independently reviewed images of  CT chest   on 06/26/2016 Interpretation: Enlarging left lower lobe lung mass  ASSESSMENT / PLAN: 76 year old white female seen today for abnormal CT scan findings with enlarging left lower lobe lung mass findings consistent with primary lung cancer in the setting of extensive smoking history with underlying COPD with chronic hypoxic respiratory failure in deconditioned state.  #1 COPD with shortness of breath and wheezing Recommend full set of pulmonary function testing -continue symbicort -Start Respimat Inhaler2.5 -Recommend prednisone 40 mg for 10 days in preparation of in the setting of ongoing smoking and shortness of breath and wheezing'  #2 enlarging left lower lobe lung mass highly suggestive of primary lung cancer Recommend undergoing diagnostic evaluation with bronchoscopy  I have explained the patient is at very high risk for postoperative pulmonary and cardiac complications due to her underlying COPD and chronic respiratory failure   #3 tobacco abuse Smoking cessation highly advised   Patient/Family are satisfied with Plan of action and management. All questions answered  follow-up in 4 weeks after procedure has been completed to assess underlying copd status    Deep Ashby Dawes M.D.  [place]

## 2016-07-01 NOTE — Procedures (Signed)
  [facility]            Bronchoscopy Note   FINDINGS/SUMMARY:   -80% constriction at the left lower lobe bronchus secondary to external compression. Fluoroscopic guided transbronchial forceps biopsies, transbronchial needle aspiration, transbronchial brushings, washings taken of this area. -Moderate mucosal erythema and edema throughout both airways. Otherwise normal airways. -Transbronchial needle aspiration taken at the level of the subcarinal node from the left mainstem bronchus.  Indication: Lung mass The patient (or their representative) was informed of the risks (including but not limited to bleeding, infection, respiratory failure, lung injury, tooth/oral injury) and benefits of the procedure and gave consent, see chart.   Pre-op diagnosis: Lung mass Post-op diagnosis: Same Estimated blood loss: 10 mL  Medications for procedure: Versed and fentanyl.  Procedure description:  The patient was brought to the endoscopy suite, after obtaining informed consent and timeout was called.  The right and left nares were checked for patency, the right nares was more patent. This was anesthetized with topical lidocaine. Flexible fiberoptic bronchoscope was passed via the right nares to the posterior pharynx. Topical lidocaine was applied, the bronchoscope was passed through the vocal cords quickly due to severe coughing to the main trachea. Subsequently topical lidocaine was applied to the right and left mainstem bronchi. An anatomical tour was undertaken, the right lung was examined, all segments were entered. There were no visible abnormalities. Upon entering the left lower lobe segment, there was an 80% constriction due to external compression, this could not be passed with the bronchoscope. There was moderate bleeding in this area when contacting with the bronchoscope, therefore, topical epinephrine was applied. The bronchoscope was taken to the subcarinal node station and  transbronchial needle aspiration was taken 2. The bronchoscope was then taken to the left lower lobe narrowing. Transbronchial needle aspiration was taken, subsequently, transbronchial forceps biopsies were taken with good returns, this was done under fluoroscopic guidance. Transbronchial brushing was taken under fluoroscopic guidance from the same area. Subsequently BAL was performed of this area. Washings were sent for microbial, pathology.  Bronchoscope was then removed, the patient began to awaken and speak, breathing spontaneously. For the duration of the procedure. Her oxygen saturation remained above 90% on nasal cannula, the patient was taken to recovery in stable condition.   Condition post procedure: Stable   --   [name], MD.  Board Certified in Internal Medicine, Pulmonary Medicine, [place], and Sleep Medicine.  [facility] Office Number: [phone]  Patricia Pesa, M.D.  Vilinda Boehringer, M.D.  Cheral Marker, M.D  07/01/2016

## 2016-07-02 ENCOUNTER — Encounter: Payer: Self-pay | Admitting: Pulmonary Disease

## 2016-07-02 ENCOUNTER — Other Ambulatory Visit: Payer: Self-pay | Admitting: Internal Medicine

## 2016-07-02 DIAGNOSIS — R918 Other nonspecific abnormal finding of lung field: Secondary | ICD-10-CM

## 2016-07-02 NOTE — Telephone Encounter (Signed)
LMTCB

## 2016-07-02 NOTE — Telephone Encounter (Signed)
-----   Message from [name], MD sent at 07/02/2016  8:24 AM EDT ----- Regarding: needs CXR Ordered follow up CXR, pt had small pneumothorax, need to make sure that it is not enlarging.

## 2016-07-03 ENCOUNTER — Other Ambulatory Visit: Payer: Self-pay | Admitting: *Deleted

## 2016-07-03 ENCOUNTER — Inpatient Hospital Stay (HOSPITAL_BASED_OUTPATIENT_CLINIC_OR_DEPARTMENT_OTHER): Payer: Medicare HMO | Admitting: Hematology and Oncology

## 2016-07-03 ENCOUNTER — Telehealth: Payer: Self-pay | Admitting: Internal Medicine

## 2016-07-03 ENCOUNTER — Inpatient Hospital Stay: Payer: Medicare HMO

## 2016-07-03 ENCOUNTER — Telehealth: Payer: Self-pay | Admitting: *Deleted

## 2016-07-03 ENCOUNTER — Ambulatory Visit
Admission: RE | Admit: 2016-07-03 | Discharge: 2016-07-03 | Disposition: A | Discharge status: Home / Self Care | Payer: Medicare HMO | Source: Ambulatory Visit | Attending: Internal Medicine | Admitting: Internal Medicine

## 2016-07-03 ENCOUNTER — Encounter: Payer: Self-pay | Admitting: Hematology and Oncology

## 2016-07-03 VITALS — BP 135/64 | HR 68 | Temp 97.9°F | Resp 18 | Wt 114.4 lb

## 2016-07-03 DIAGNOSIS — Z923 Personal history of irradiation: Secondary | ICD-10-CM | POA: Diagnosis not present

## 2016-07-03 DIAGNOSIS — R918 Other nonspecific abnormal finding of lung field: Secondary | ICD-10-CM | POA: Diagnosis present

## 2016-07-03 DIAGNOSIS — J939 Pneumothorax, unspecified: Secondary | ICD-10-CM | POA: Insufficient documentation

## 2016-07-03 DIAGNOSIS — Z853 Personal history of malignant neoplasm of breast: Secondary | ICD-10-CM

## 2016-07-03 DIAGNOSIS — L89323 Pressure ulcer of left buttock, stage 3: Secondary | ICD-10-CM | POA: Diagnosis not present

## 2016-07-03 DIAGNOSIS — Z170 Estrogen receptor positive status [ER+]: Secondary | ICD-10-CM | POA: Diagnosis not present

## 2016-07-03 DIAGNOSIS — I700 Atherosclerosis of aorta: Secondary | ICD-10-CM

## 2016-07-03 DIAGNOSIS — C3432 Malignant neoplasm of lower lobe, left bronchus or lung: Secondary | ICD-10-CM

## 2016-07-03 DIAGNOSIS — J449 Chronic obstructive pulmonary disease, unspecified: Secondary | ICD-10-CM

## 2016-07-03 DIAGNOSIS — Z87440 Personal history of urinary (tract) infections: Secondary | ICD-10-CM

## 2016-07-03 DIAGNOSIS — Z9221 Personal history of antineoplastic chemotherapy: Secondary | ICD-10-CM

## 2016-07-03 DIAGNOSIS — I110 Hypertensive heart disease with heart failure: Secondary | ICD-10-CM

## 2016-07-03 DIAGNOSIS — M069 Rheumatoid arthritis, unspecified: Secondary | ICD-10-CM

## 2016-07-03 DIAGNOSIS — Z7952 Long term (current) use of systemic steroids: Secondary | ICD-10-CM

## 2016-07-03 DIAGNOSIS — M797 Fibromyalgia: Secondary | ICD-10-CM

## 2016-07-03 DIAGNOSIS — C50912 Malignant neoplasm of unspecified site of left female breast: Secondary | ICD-10-CM

## 2016-07-03 DIAGNOSIS — Z79899 Other long term (current) drug therapy: Secondary | ICD-10-CM

## 2016-07-03 DIAGNOSIS — Z9223 Personal history of estrogen therapy: Secondary | ICD-10-CM

## 2016-07-03 DIAGNOSIS — D638 Anemia in other chronic diseases classified elsewhere: Secondary | ICD-10-CM | POA: Diagnosis not present

## 2016-07-03 DIAGNOSIS — E875 Hyperkalemia: Secondary | ICD-10-CM

## 2016-07-03 DIAGNOSIS — G629 Polyneuropathy, unspecified: Secondary | ICD-10-CM | POA: Diagnosis not present

## 2016-07-03 DIAGNOSIS — Z880 Allergy status to penicillin: Secondary | ICD-10-CM

## 2016-07-03 DIAGNOSIS — I509 Heart failure, unspecified: Secondary | ICD-10-CM

## 2016-07-03 DIAGNOSIS — Z881 Allergy status to other antibiotic agents status: Secondary | ICD-10-CM

## 2016-07-03 DIAGNOSIS — Z9981 Dependence on supplemental oxygen: Secondary | ICD-10-CM

## 2016-07-03 DIAGNOSIS — F17210 Nicotine dependence, cigarettes, uncomplicated: Secondary | ICD-10-CM

## 2016-07-03 DIAGNOSIS — N390 Urinary tract infection, site not specified: Secondary | ICD-10-CM | POA: Diagnosis not present

## 2016-07-03 LAB — CBC WITH DIFFERENTIAL/PLATELET
Basophils Absolute: 0.1 10*3/uL (ref 0–0.1)
Basophils Relative: 0 %
Eosinophils Absolute: 0 10*3/uL (ref 0–0.7)
Eosinophils Relative: 0 %
HCT: 36 % (ref 35.0–47.0)
Hemoglobin: 12.2 g/dL (ref 12.0–16.0)
Lymphocytes Relative: 11 %
Lymphs Abs: 1.5 10*3/uL (ref 1.0–3.6)
MCH: 29 pg (ref 26.0–34.0)
MCHC: 33.8 g/dL (ref 32.0–36.0)
MCV: 85.8 fL (ref 80.0–100.0)
Monocytes Absolute: 0.5 10*3/uL (ref 0.2–0.9)
Monocytes Relative: 4 %
Neutro Abs: 12.4 10*3/uL — ABNORMAL HIGH (ref 1.4–6.5)
Neutrophils Relative %: 85 %
Platelets: 392 10*3/uL (ref 150–440)
RBC: 4.19 MIL/uL (ref 3.80–5.20)
RDW: 14.9 % — ABNORMAL HIGH (ref 11.5–14.5)
WBC: 14.5 10*3/uL — ABNORMAL HIGH (ref 3.6–11.0)

## 2016-07-03 LAB — CYTOLOGY - NON PAP

## 2016-07-03 LAB — COMPREHENSIVE METABOLIC PANEL
ALT: 16 U/L (ref 14–54)
AST: 25 U/L (ref 15–41)
Albumin: 3.8 g/dL (ref 3.5–5.0)
Alkaline Phosphatase: 104 U/L (ref 38–126)
Anion gap: 8 (ref 5–15)
BUN: 32 mg/dL — ABNORMAL HIGH (ref 6–20)
CO2: 28 mmol/L (ref 22–32)
Calcium: 9.3 mg/dL (ref 8.9–10.3)
Chloride: 95 mmol/L — ABNORMAL LOW (ref 101–111)
Creatinine, Ser: 1.19 mg/dL — ABNORMAL HIGH (ref 0.44–1.00)
GFR calc Af Amer: 51 mL/min — ABNORMAL LOW (ref >60)
GFR calc non Af Amer: 44 mL/min — ABNORMAL LOW (ref >60)
Glucose, Bld: 116 mg/dL — ABNORMAL HIGH (ref 65–99)
Potassium: 5.5 mmol/L — ABNORMAL HIGH (ref 3.5–5.1)
Sodium: 131 mmol/L — ABNORMAL LOW (ref 135–145)
Total Bilirubin: 0.6 mg/dL (ref 0.3–1.2)
Total Protein: 7.7 g/dL (ref 6.5–8.1)

## 2016-07-03 NOTE — Telephone Encounter (Signed)
DS notified of [facility] calling in regards to CXR. Nothing further needed.

## 2016-07-03 NOTE — Progress Notes (Signed)
Patient here today regarding left lung cancer.   Patient had lung biopsy on Tuesday.  Patient has history of left breast cancer.  Patient had CXR today prior to appointment today.

## 2016-07-03 NOTE — Progress Notes (Signed)
START ON PATHWAY REGIMEN - Non-Small Cell Lung     Administer weekly:     Paclitaxel      Carboplatin   **Always confirm dose/schedule in your pharmacy ordering system**    Patient Characteristics: Stage III - Unresectable, PS = 0, 1 AJCC T Category: T2b Current Disease Status: No Distant Mets or Local Recurrence AJCC N Category: N2 AJCC M Category: M0 AJCC 8 Stage Grouping: IIIA Performance Status: PS = 0, 1 Intent of Therapy: Curative Intent, Discussed with Patient

## 2016-07-03 NOTE — Telephone Encounter (Signed)
Patient notified - to come at 10 am tomorrow for lab only. Recheck potassium. Level today was 5.5.  Pt gave verbal understand. She will wait on potassium results.

## 2016-07-03 NOTE — Progress Notes (Addendum)
[facility] day:  07/03/2016   Chief Complaint: [name] [name] is a 76 y.o. female with a left lower lobe mass who is seen for assessment after interval PET scan and bronchoscopy.  HPI: The patient was last seen in the medical oncology clinic on 06/27/2106.  At that time, she was seen for initial assessment by me.  She has a history of breast cancer 11 years ago.  She had a new left lower lobe mass.  PET scan and bronchoscopy were scheduled.  PET scan on 06/27/2016 revealed a central left lower lobe lung lesion consistent with primary bronchogenic carcinoma.  There was equivocal nodal tissue in the subcarinal station demonstrating mild hypermetabolism. There was more peripheral left lower lobe increased atelectasis with mucoid impaction which is likely secondary to endobronchial obstruction.  There was a small left pleural effusion.  She underwent bronchoscopy on 07/01/2016 by Dr. Ashby Dawes.  Pathology revealed squamous cell lung cancer.  Symptomatically, patient denies physical complaints. She reports that she is feeling "pretty good".  She reports a cough yesterday after her bronchoscopy.   Past Medical History:  Diagnosis Date  . Asthma   . Breast cancer ([place]) 2009   left  . Cancer ([place])   . CHF (congestive heart failure) ([place])   . CHF (congestive heart failure) ([place])   . Chronic UTI   . COPD (chronic obstructive pulmonary disease) ([place])   . Dizziness   . Fibromyalgia   . Hypertension   . Neuropathy   . Personal history of tobacco use, presenting hazards to health 05/17/2015  . Polyp, larynx   . RA (rheumatoid arthritis) ([place])   . Sinus infection    recent  . Stumbling gait    to the left  . Supplemental oxygen dependent    2.5l    Past Surgical History:  Procedure Laterality Date  . ABDOMINAL HYSTERECTOMY    . BREAST LUMPECTOMY Left 2009   chemo and radiation  . CYST EXCISION Left 02/27/2015   Procedure: CYST REMOVAL;   Surgeon: Hessie Knows, MD;  Location: [facility] ORS;  Service: Orthopedics;  Laterality: Left;  . EYE MUSCLE SURGERY Right    13 surgeries  . FLEXIBLE BRONCHOSCOPY N/A 07/01/2016   Procedure: FLEXIBLE BRONCHOSCOPY;  Surgeon: Wilhelmina Mcardle, MD;  Location: [facility] ORS;  Service: Pulmonary;  Laterality: N/A;  . THUMB ARTHROSCOPY Left     Family History  Problem Relation Age of Onset  . Diabetes Father   . Stroke Father   . Heart attack Father   . CAD Sister     Social History:  reports that she has been smoking Cigarettes.  She has a 40.00 pack-year smoking history. She has never used smokeless tobacco. She reports that she does not drink alcohol or use drugs.  The patient is accompanied by her friend, Aida Puffer.   Allergies:  Allergies  Allergen Reactions  . Contrast Media [Iodinated Diagnostic Agents] Other (See Comments)    Pt was sent to the ED following contrast media injection at [place]. Unknown reason. She has been premedicated since without complications. Pt to be premedicated prior to contrast media injections  . Ioxaglate Other (See Comments)    Pt was sent to the ED following contrast media injection at [place]. Unknown reason. She has been premedicated since without complications. Pt to be premedicated prior to contrast media injections  . Sulfa Antibiotics     Other reaction(s): Other (See Comments)  . Tetracycline  Hives and Other (See Comments)  . White Petrolatum Other (See Comments)  . Amoxicillin-Pot Clavulanate Rash    Other reaction(s): Unknown Other reaction(s): Unknown _0  Other reaction(s): Unknown Blisters in mouth Blisters in mouth  . Tape Rash    Current Medications: Current Outpatient Prescriptions  Medication Sig Dispense Refill  . Calcium-Vitamin D 600-200 MG-UNIT tablet Take 1 tablet by mouth daily.     . carvedilol (COREG) 12.5 MG tablet  Take 1 tablet (12.5 mg total) by mouth 2 (two) times daily with a meal. 60 tablet 0  . cholecalciferol (VITAMIN D) 1000 units tablet Take 1,000 Units by mouth daily.    . clonazePAM (KLONOPIN) 1 MG tablet Take 1 mg by mouth 2 (two) times daily as needed for anxiety.     . gabapentin (NEURONTIN) 600 MG tablet Take 600 mg by mouth 3 (three) times daily.     [name] HYDROcodone-acetaminophen (NORCO/VICODIN) 5-325 MG tablet Take 1-2 tablets by mouth every 4 (four) hours as needed for moderate pain.     [name] imipramine (TOFRANIL) 25 MG tablet Take 1 tablet by mouth at bedtime.     . iron polysaccharides (FERREX 150) 150 MG capsule Take 150 mg by mouth daily.    . Multiple Vitamin (MULTIVITAMIN) capsule Take 1 capsule by mouth daily.    . predniSONE (DELTASONE) 20 MG tablet Take 2 tablets (40 mg total) by mouth daily with breakfast. 10 days 20 tablet 0  . Tiotropium Bromide Monohydrate (SPIRIVA RESPIMAT) 2.5 MCG/ACT AERS Inhale 2.5 mcg into the lungs 2 (two) times daily. (Patient taking differently: Inhale 2.5 mcg into the lungs daily. ) 1 Inhaler 0  . traMADol (ULTRAM) 50 MG tablet Take 50 mg by mouth daily.     . vitamin C (ASCORBIC ACID) 500 MG tablet Take 500 mg by mouth daily.    [name] ZINC SULFATE PO Take 1 tablet by mouth daily.     [name] zolpidem (AMBIEN) 5 MG tablet Take 5 mg by mouth at bedtime.     No current facility-administered medications for this visit.     Review of Systems:  GENERAL:  Feels "old".  No fevers, sweats or weight loss. PERFORMANCE STATUS (ECOG):  2 HEENT:  No visual changes, runny nose, sore throat, mouth sores or tenderness. Lungs: Shortness of breath.  Cough.  No hemoptysis.  On oxygen 2 liter/min visa [place]. Cardiac:  No chest pain, palpitations, orthopnea, or PND. GI:  No nausea, vomiting, diarrhea, constipation, melena or hematochezia. GU:  No urgency, frequency, dysuria, or hematuria. Recent UTI. Musculoskeletal:  Fibromyalgia.  Osteoporosis.  No back pain.  No joint pain.  No  muscle tenderness. Extremities:  No pain or swelling. Skin:  Decubitus.  Heel eschar.  No rashes or skin changes. Neuro:  No headache, numbness or weakness, balance or coordination issues. Endocrine:  No diabetes, thyroid issues, hot flashes or night sweats. Psych:  No mood changes, depression or anxiety. Pain:  No focal pain. Review of systems:  All other systems reviewed and found to be negative.  Physical Exam: Blood pressure 135/64, pulse 68, temperature 97.9 F (36.6 C), temperature source Tympanic, resp. rate 18, weight 114 lb 6 oz (51.9 kg). GENERAL:  Thin woman sitting comfortably in a wheelchair in the exam room in no acute distress. MENTAL STATUS:  Alert and oriented to person, place and time. HEAD:  Short blonde hair.  Normocephalic, atraumatic, face symmetric, no Cushingoid  features. EYES:  Blue eyes.  No conjunctivitis or scleral icterus. ENT:  Oropharynx clear without lesion.  Tongue normal. Mucous membranes moist.  RESPIRATORY:  Clear to auscultation.  No rales, wheezes or rhonchi. CARDIOVASCULAR:  Regular rate and rhythm without murmur, rub or gallop. SKIN:  No rashes, ulcers or lesions. EXTREMITIES: Arthritic changes in hands.  No edema, no skin discoloration or tenderness.  No palpable cords. LYMPH NODES: No palpable cervical, supraclavicular, axillary or inguinal adenopathy  NEUROLOGICAL: Unremarkable. PSYCH:  Appropriate.   Admission on 07/01/2016, Discharged on 07/01/2016  Component Date Value Ref Range Status  . CYTOLOGY - NON GYN 07/01/2016    Final                   Value:Cytology - Non PAP CASE: [id number] PATIENT: La Andreoni Non-Gyn Cytology Report     SPECIMEN SUBMITTED: A. Subcarinal node  CLINICAL HISTORY: History of T1cN0M0 left breast cancer in 02/2005 now with 3.2 x 3.0 mass like area of focal opacity in the medial left lower lobe, smoker  PRE-OPERATIVE DIAGNOSIS: Lung mass  POST-OPERATIVE DIAGNOSIS: Same as  pre-op     DIAGNOSIS: A. LYMPH NODE, SUBCARINAL; FNA: - INSUFFICIENT FOR DIAGNOSIS; LYMPHOID MATERIAL NOT PRESENT. - BENIGN BRONCHIAL CELLS AND MIXED INFLAMMATION.   GROSS DESCRIPTION:  A. Site: subcarinal node Procedure: bronchoscopy Cytotechnologist: 2 Specimen(s) collected: 2 Diff Quik stained slides 2  Pap stained slides Specimen labeled subcarinal node:      Description: clear CytoLyt solution with a few wispy fragments      Submitted for:           ThinPrep           Cell block(s): 1   Final Diagnosis performed by Quay Burow, MD.  Electronically signed 07/03/2016 8:54:31AM    The                          electronic signature indicates that the named Attending Pathologist has evaluated the specimen  Technical component performed at [facility], [address] [place], [place] [address] Lab: [phone] Dir: Darrick Penna. Evette Doffing, MD  Professional component performed at [facility], [facility], [place], [place], [place] [address] Lab: [phone] Dir: Dellia Nims. Rubinas, MD    . CYTOLOGY - NON GYN 07/01/2016    Final                   Value:Cytology - Non PAP CASE: [id number] PATIENT: Khristi Tellefsen Non-Gyn Cytology Report     SPECIMEN SUBMITTED: A. Lung, left lower lobe  CLINICAL HISTORY: History of T1cN0M0 left breast cancer in 02/2005 now with 3.2 x 3.0 mass like area of focal opacity in the medial left lower lobe, smoker  PRE-OPERATIVE DIAGNOSIS: Lung mass  POST-OPERATIVE DIAGNOSIS: Same as pre-op     DIAGNOSIS: A. LUNG, LEFT LOWER LOBE; BRONCHIAL BRUSHING: - SQUAMOUS CELL CARCINOMA.  Comment: There is insufficient material in this specimen for ancillary studies. These findings were communicated to Dr. Mike Gip on 07/03/2016.   GROSS DESCRIPTION:  A. Site: left lower lobe lung Procedure: bronchoscopy Cytotechnologist: Molli Barrows and Micheline Rough Specimen(s) collected: 2 Diff Quik stained slides Specimen  labeled LLL brush:      Description: clear CytoLyt solution with a brush with attached red tissue      Submitted for:           ThinPrep           Cell block(s): 1  A  forceps biopsy was obtained and will be reported in a separate ARS report. Final Diagnosis performed by Quay Burow, MD.  Electronically signed 07/03/2016 12:05:56PM    The electronic signature indicates that the named Attending Pathologist has evaluated the specimen  Technical component performed at [facility], [address], [place], [place] [address] Lab: [phone] Dir: Darrick Penna. Evette Doffing, MD  Professional component performed at [facility], [facility], [place], [place], [place] [address] Lab: [phone] Dir: Dellia Nims. Rubinas, MD    . CYTOLOGY - NON GYN 07/01/2016    Final                   Value:Cytology - Non PAP CASE: [id number] PATIENT: Sansa Stanhope Non-Gyn Cytology Report     SPECIMEN SUBMITTED: A. Lung, left lower lobe  CLINICAL HISTORY: History of T1cN0M0 left breast cancer in 02/2005 now with 3.2 x 3.0 mass like area of focal opacity in the medial left lower lobe, smoker  PRE-OPERATIVE DIAGNOSIS: Lung mass  POST-OPERATIVE DIAGNOSIS: Same as pre-op     DIAGNOSIS: A. LUNG, LEFT LOWER LOBE; BRONCHIAL LAVAGE: - RARE ATYPICAL CELLS, INSUFFICIENT FOR DIAGNOSIS.   GROSS DESCRIPTION:  A. Site: left lower lobe lung Procedure: bronchoscopy Cytotechnologist: Molli Barrows and Micheline Rough Specimen(s) collected: Specimen labeled LLL wash:      Volume:  (for lavage only) 10 mL      Description: cloudy red fluid      Submitted for:           ThinPrep           Cell block(s): 1   A forceps biopsy was obtained and will be reported in a separate ARS report. Final Diagnosis performed by Quay Burow, MD.  Electronically signed 07/03/2016 1                         2:06:07PM    The electronic signature indicates that the named  Attending Pathologist has evaluated the specimen  Technical component performed at [facility], [address] [place], [place] [address] Lab: [phone] Dir: Darrick Penna. Evette Doffing, MD  Professional component performed at [facility], [facility], [place], [place], [place] [address] Lab: [phone] Dir: Dellia Nims. Reuel Derby, MD    . Specimen Description 07/01/2016 BRONCHIAL ALVEOLAR LAVAGE   Final  . Special Requests 07/01/2016 Immunocompromised   Final  . Gram Stain 07/01/2016    Final                   Value:DEGENERATED CELLULAR MATERIAL PRESENT NO ORGANISMS SEEN   . Culture 07/01/2016    Final                   Value:CULTURE REINCUBATED FOR BETTER GROWTH Performed at [facility], [place] [address] [place], [place] [address]   . Report Status 07/01/2016 PENDING   Incomplete  . Viral Culture 07/01/2016 Comment   Final   Comment: (NOTE) Preliminary Report: No virus isolated at 24 hours. Next report to follow after 4 days. Performed At: [facility] [place], [place] [id number] Lindon Romp MD GU:[id number]   . Source of Sample 07/01/2016 BRONCHIAL ALVEOLAR LAVAGE   Final  . SURGICAL PATHOLOGY 07/01/2016    Final                   Value:Surgical Pathology CASE: [id number] PATIENT: Jenessa Sill Surgical Pathology Report     SPECIMEN SUBMITTED: A. Lung,  left lower lobe  CLINICAL HISTORY: History of T1cN0M0 left breast cancer in 02/2005 now with 3.2 x 3.0 mass like area of focal opacity in the medial left lower lobe, smoker  PRE-OPERATIVE DIAGNOSIS: Lung mass  POST-OPERATIVE DIAGNOSIS: Same as pre-op     DIAGNOSIS: A. LUNG MASS, LEFT LOWER LOBE; BIOPSY: - SQUAMOUS CELL CARCINOMA.  Comment: There is sufficient material in block A1 for ancillary studies. These findings were communicated to Dr. Mike Gip on 07/03/2016.   GROSS DESCRIPTION:  A. Labeled: LLL bx  Tissue fragment(s): multiple  Size: aggregate,  0.6 x 0.2 x 0.1 cm  Description: wispy red fragments, wrapped in lens paper marked orange and entirely submitted in a mesh bag  Entirely submitted in 1 cassette(s).   2 diff quik stained slides also prepared at the procedure Final Diagnosis performed by Quay Burow, MD.  E                         lectronically signed 07/03/2016 12:07:13PM    The electronic signature indicates that the named Attending Pathologist has evaluated the specimen  Technical component performed at [facility], [address], [place], [place] [address] Lab: [phone] Dir: Darrick Penna. Evette Doffing, MD  Professional component performed at [facility], [facility], [place], [place], [place] [address] Lab: [phone] Dir: Dellia Nims. Rubinas, MD    . CYTOLOGY - NON GYN 07/01/2016    Final                   Value:Cytology - Non PAP CASE: [id number] PATIENT: Keyri Hinely Non-Gyn Cytology Report     SPECIMEN SUBMITTED: A. Lung, left lower lobe  CLINICAL HISTORY: History of T1cN0M0 left breast cancer in 02/2005 now with 3.2 x 3.0 mass like area of focal opacity in the medial left lower lobe, smoker  PRE-OPERATIVE DIAGNOSIS: Lung mass  POST-OPERATIVE DIAGNOSIS: Same as pre-op     DIAGNOSIS: A. LUNG, LEFT LOWER LOBE; FNA: - SQUAMOUS CELL CARCINOMA.  Comment: There is insufficient material in the cell block for ancillary studies. These findings were communicated to Dr. Mike Gip on 07/03/2016.   GROSS DESCRIPTION:  A. Site: left lower lobe lung Procedure: bronchoscopy Cytotechnologist: Molli Barrows and Micheline Rough Specimen(s) collected: 2 Diff Quik stained slides 2  Pap stained slides Specimen labeled LLL FNA:      Description: pink CytoLyt solution wispy pink-tan fragments      Submitted for:           ThinPrep           Cell block(s): 1  A forceps                          biopsy was obtained and will be reported in a separate ARS report. Final Diagnosis  performed by Quay Burow, MD.  Electronically signed 07/03/2016 12:06:33PM    The electronic signature indicates that the named Attending Pathologist has evaluated the specimen  Technical component performed at [facility], [address] [place], [place] [address] Lab: [phone] Dir: Darrick Penna. Evette Doffing, MD  Professional component performed at [facility], [facility], [place], [place], [place] [address] Lab: [phone] Dir: Dellia Nims. Rubinas, MD      Assessment:  ISOBEL EISENHUTH is a 76 y.o. female with a clinical T2bNxM0 sqamous cell lung cancer of the left lung s/p bronchoscopy and biopsy on 07/01/2016.  She has a 40 pack year smoking history.  She  presented with left lower chest wall pain.  Chest CT with contrast on 06/11/2016 revealed a 3.2 x 3.0 mass like area of focal opacity in the medial left lower lobe with obliteration of segmental airways to the anterior left lower lobe.  PET scan on 06/27/2016 revealed a 4.3 x 3.2 cm central left lower lobe lung lesion (SUV 14.3) consistent with primary bronchogenic carcinoma.  There was equivocal nodal tissue in the subcarinal station demonstrating mild hypermetabolism (SUV 3.6). There was more peripheral left lower lobe increased atelectasis with mucoid impaction which is likely secondary to endobronchial obstruction.  There was a small left pleural effusion.  She has a history of stage IA left breast cancer in 02/2005.  She underwent lumpectomy.  Pathology revealed a T1cN0 lesion.  Tumor was ER + , PR +, and Her2/neu -.  Two sentinel lymph nodes were negative.  She received chemotherapy (4 cycles of AC and possibly an abbreviated course of Taxol- no records available).  She received radiation.  She completed Femara in 08/2010.  She has fibromyalgia.  She has advanced thoracoabdominal aortic atherosclerosis with fusiform dilatation of the descending thoracic aorta up to 4.3 cm.  She is followed by Dr  Lucky Cowboy.  Symptomatically, she feels "pretty good" today.  Exam is stable.  Plan: 1.  Discuss PET scan and bronchoscopy results.  Pathology confirmed squamous cell lung cancer.  Subcarinal tissue was inadequate.  Patient has squamous cell lung cancer.  Patient has been seen surgery and radiation therapy. She does not appear to be a surgical candidate.  She appears to have clinical stage IIIA disease if subcarinal node + (N2).  Dr Genevive Bi contacted.  Discuss plans for radiation or radiation + chemotherapy (weekly carboplatin and Taxol).  Given possible N2 disease, would favor radiation with chemotherapy.  Discussed side effects of weekly carboplatin and Taxol.  Patient declines port. 2.  Head MRI. 3.  Preauth carboplatin + Taxol weekly 4.  Chemotherapy class 5.  Follow-up with Dr. Baruch Gouty next week. 6.  Anticipate initiation of weekly carboplatin + Taxol with concurrent radiation.  If treatment begins before 07/21/2016, patient to see Dr Janese Banks, labs (CBC with diff, CMP, Mg) and week #1 carboplatin + Taxol  Addendum:  Patient's chemistries revealed an elevated potassium (5.5) and elevated BUN.   Hydration encouraged.  Potassium rich foods to be avoided.  Recheck in AM.   Lequita Asal, MD  07/03/2016, 3:17 PM

## 2016-07-03 NOTE — Telephone Encounter (Signed)
Please call back regarding chest xray results.

## 2016-07-04 ENCOUNTER — Inpatient Hospital Stay: Payer: Medicare HMO

## 2016-07-04 ENCOUNTER — Encounter: Payer: Self-pay | Admitting: *Deleted

## 2016-07-04 ENCOUNTER — Ambulatory Visit: Payer: Medicare HMO | Admitting: Hematology and Oncology

## 2016-07-04 DIAGNOSIS — I110 Hypertensive heart disease with heart failure: Secondary | ICD-10-CM | POA: Diagnosis not present

## 2016-07-04 DIAGNOSIS — Z853 Personal history of malignant neoplasm of breast: Secondary | ICD-10-CM | POA: Diagnosis not present

## 2016-07-04 DIAGNOSIS — Z923 Personal history of irradiation: Secondary | ICD-10-CM | POA: Diagnosis not present

## 2016-07-04 DIAGNOSIS — J449 Chronic obstructive pulmonary disease, unspecified: Secondary | ICD-10-CM | POA: Diagnosis not present

## 2016-07-04 DIAGNOSIS — Z170 Estrogen receptor positive status [ER+]: Secondary | ICD-10-CM | POA: Diagnosis not present

## 2016-07-04 DIAGNOSIS — E875 Hyperkalemia: Secondary | ICD-10-CM

## 2016-07-04 DIAGNOSIS — C3432 Malignant neoplasm of lower lobe, left bronchus or lung: Secondary | ICD-10-CM | POA: Diagnosis not present

## 2016-07-04 DIAGNOSIS — R918 Other nonspecific abnormal finding of lung field: Secondary | ICD-10-CM | POA: Diagnosis not present

## 2016-07-04 DIAGNOSIS — Z9223 Personal history of estrogen therapy: Secondary | ICD-10-CM | POA: Diagnosis not present

## 2016-07-04 DIAGNOSIS — Z9221 Personal history of antineoplastic chemotherapy: Secondary | ICD-10-CM | POA: Diagnosis not present

## 2016-07-04 LAB — BASIC METABOLIC PANEL
Anion gap: 11 (ref 5–15)
BUN: 35 mg/dL — ABNORMAL HIGH (ref 6–20)
CO2: 26 mmol/L (ref 22–32)
Calcium: 8.9 mg/dL (ref 8.9–10.3)
Chloride: 92 mmol/L — ABNORMAL LOW (ref 101–111)
Creatinine, Ser: 1.27 mg/dL — ABNORMAL HIGH (ref 0.44–1.00)
GFR calc Af Amer: 47 mL/min — ABNORMAL LOW (ref >60)
GFR calc non Af Amer: 41 mL/min — ABNORMAL LOW (ref >60)
Glucose, Bld: 153 mg/dL — ABNORMAL HIGH (ref 65–99)
Potassium: 3.9 mmol/L (ref 3.5–5.1)
Sodium: 129 mmol/L — ABNORMAL LOW (ref 135–145)

## 2016-07-04 LAB — CULTURE, BAL-QUANTITATIVE W GRAM STAIN: Culture: NO GROWTH

## 2016-07-04 NOTE — Progress Notes (Signed)
  Oncology Nurse Navigator Documentation  Navigator Location: [facility] (07/04/16 1300)   )Navigator Encounter Type: Lobby (07/04/16 1300)     Confirmed Diagnosis Date: 07/03/16 (07/04/16 1300)                 Treatment Phase: Pre-Tx/Tx Discussion (07/04/16 1300) Barriers/Navigation Needs: Transportation (07/04/16 1300)   Interventions: Education (07/04/16 1300)     Education Method: Written (07/04/16 1300)  Support Groups/Services: Other (lung cancer support group, counseling services, pulm rehab) (07/04/16 1300)      met with patient in lobby after lab work this morning. Pt given education materials regarding lung cancer diagnosis and treatment recommendations. Info regarding support services and smoking cessation provided. Future appts reviewed with patient. Pt voiced difficulty with finding transportation. Informed pt that if needs help with transportation to let me know and we could add her to the [facility] schedule. Pt stated that does not need help at this time but will let me know in the future. No further questions. Informed pt to call me if needs anything. Pt verbalized understanding.       Time Spent with Patient: 30 (07/04/16 1300)

## 2016-07-07 ENCOUNTER — Ambulatory Visit
Admission: RE | Admit: 2016-07-07 | Discharge: 2016-07-07 | Disposition: A | Discharge status: Home / Self Care | Payer: Medicare HMO | Source: Ambulatory Visit | Attending: Hematology and Oncology | Admitting: Hematology and Oncology

## 2016-07-07 ENCOUNTER — Telehealth: Payer: Self-pay | Admitting: Internal Medicine

## 2016-07-07 DIAGNOSIS — Z170 Estrogen receptor positive status [ER+]: Secondary | ICD-10-CM

## 2016-07-07 DIAGNOSIS — C50912 Malignant neoplasm of unspecified site of left female breast: Secondary | ICD-10-CM

## 2016-07-07 DIAGNOSIS — R918 Other nonspecific abnormal finding of lung field: Secondary | ICD-10-CM | POA: Diagnosis present

## 2016-07-07 DIAGNOSIS — I6782 Cerebral ischemia: Secondary | ICD-10-CM | POA: Diagnosis not present

## 2016-07-07 DIAGNOSIS — I639 Cerebral infarction, unspecified: Secondary | ICD-10-CM | POA: Diagnosis not present

## 2016-07-07 DIAGNOSIS — R51 Headache: Secondary | ICD-10-CM | POA: Diagnosis not present

## 2016-07-07 MED ORDER — GADOBENATE DIMEGLUMINE 529 MG/ML IV SOLN
10.0000 mL | Freq: Once | INTRAVENOUS | Status: AC | PRN
  Administered 2016-07-07: 10 mL via INTRAVENOUS

## 2016-07-07 NOTE — Telephone Encounter (Signed)
Pt calling stating since being on Symbicort   She's had a red face along with high BP  She saw on TV that this could be some side effects  She has not taking it anymore   Would like some advise since she can't take it

## 2016-07-07 NOTE — Telephone Encounter (Signed)
Advise to patient:  Please call you insurance company and obtain copy of your medication formulary   This will help your Pulmonologist to prescribe the most cost effective medications that your insurance company allows.

## 2016-07-07 NOTE — Telephone Encounter (Signed)
Patient just wanted DK to know she is d/c Symbicort due to facial redness and headache. She says she is doing ok on Sprivia Respimat alone. She will call ins. To get copy of formulary.

## 2016-07-08 ENCOUNTER — Ambulatory Visit
Admission: RE | Admit: 2016-07-08 | Discharge: 2016-07-08 | Disposition: A | Discharge status: Home / Self Care | Payer: Medicare HMO | Source: Ambulatory Visit | Attending: Radiation Oncology | Admitting: Radiation Oncology

## 2016-07-08 ENCOUNTER — Institutional Professional Consult (permissible substitution): Payer: Medicare HMO | Admitting: Internal Medicine

## 2016-07-08 ENCOUNTER — Encounter: Payer: Self-pay | Admitting: *Deleted

## 2016-07-08 DIAGNOSIS — I110 Hypertensive heart disease with heart failure: Secondary | ICD-10-CM | POA: Diagnosis not present

## 2016-07-08 DIAGNOSIS — Z510 Encounter for antineoplastic radiation therapy: Secondary | ICD-10-CM | POA: Diagnosis not present

## 2016-07-08 DIAGNOSIS — C3432 Malignant neoplasm of lower lobe, left bronchus or lung: Secondary | ICD-10-CM | POA: Diagnosis not present

## 2016-07-08 DIAGNOSIS — J449 Chronic obstructive pulmonary disease, unspecified: Secondary | ICD-10-CM | POA: Diagnosis not present

## 2016-07-08 DIAGNOSIS — I509 Heart failure, unspecified: Secondary | ICD-10-CM | POA: Diagnosis not present

## 2016-07-08 DIAGNOSIS — M069 Rheumatoid arthritis, unspecified: Secondary | ICD-10-CM | POA: Diagnosis not present

## 2016-07-08 DIAGNOSIS — N390 Urinary tract infection, site not specified: Secondary | ICD-10-CM | POA: Diagnosis not present

## 2016-07-08 DIAGNOSIS — L89323 Pressure ulcer of left buttock, stage 3: Secondary | ICD-10-CM | POA: Diagnosis not present

## 2016-07-08 DIAGNOSIS — R918 Other nonspecific abnormal finding of lung field: Secondary | ICD-10-CM | POA: Diagnosis not present

## 2016-07-08 DIAGNOSIS — M797 Fibromyalgia: Secondary | ICD-10-CM | POA: Diagnosis not present

## 2016-07-08 DIAGNOSIS — F17210 Nicotine dependence, cigarettes, uncomplicated: Secondary | ICD-10-CM | POA: Diagnosis not present

## 2016-07-08 DIAGNOSIS — D638 Anemia in other chronic diseases classified elsewhere: Secondary | ICD-10-CM | POA: Diagnosis not present

## 2016-07-08 DIAGNOSIS — G629 Polyneuropathy, unspecified: Secondary | ICD-10-CM | POA: Diagnosis not present

## 2016-07-08 NOTE — Progress Notes (Signed)
  Oncology Nurse Navigator Documentation  Navigator Location: [facility] (07/08/16 1100)   )                      Patient Visit Type: CYELYH (07/08/16 1100) Treatment Phase: CT SIM (07/08/16 1100) Barriers/Navigation Needs: Transportation (07/08/16 1100)   Interventions: Transportation (07/08/16 1100)         met with patient prior to CT Sim. All questions answered. Pt stated has difficulty finding transportation to appts. Informed pt that can arrange for her to ride the [place] to her upcoming appts. Pt verbalized understanding.              Time Spent with Patient: 15 (07/08/16 1100)

## 2016-07-09 ENCOUNTER — Other Ambulatory Visit: Payer: Self-pay | Admitting: *Deleted

## 2016-07-09 LAB — VIRUS CULTURE

## 2016-07-10 ENCOUNTER — Ambulatory Visit: Payer: Medicare HMO | Attending: Internal Medicine

## 2016-07-10 ENCOUNTER — Other Ambulatory Visit: Payer: Medicare HMO

## 2016-07-10 DIAGNOSIS — M797 Fibromyalgia: Secondary | ICD-10-CM | POA: Diagnosis not present

## 2016-07-10 DIAGNOSIS — J449 Chronic obstructive pulmonary disease, unspecified: Secondary | ICD-10-CM | POA: Diagnosis not present

## 2016-07-10 DIAGNOSIS — I110 Hypertensive heart disease with heart failure: Secondary | ICD-10-CM | POA: Diagnosis not present

## 2016-07-10 DIAGNOSIS — M069 Rheumatoid arthritis, unspecified: Secondary | ICD-10-CM | POA: Diagnosis not present

## 2016-07-10 DIAGNOSIS — G629 Polyneuropathy, unspecified: Secondary | ICD-10-CM | POA: Diagnosis not present

## 2016-07-10 DIAGNOSIS — N390 Urinary tract infection, site not specified: Secondary | ICD-10-CM | POA: Diagnosis not present

## 2016-07-10 DIAGNOSIS — D638 Anemia in other chronic diseases classified elsewhere: Secondary | ICD-10-CM | POA: Diagnosis not present

## 2016-07-10 DIAGNOSIS — I509 Heart failure, unspecified: Secondary | ICD-10-CM | POA: Diagnosis not present

## 2016-07-10 DIAGNOSIS — L89323 Pressure ulcer of left buttock, stage 3: Secondary | ICD-10-CM | POA: Diagnosis not present

## 2016-07-15 DIAGNOSIS — Z510 Encounter for antineoplastic radiation therapy: Secondary | ICD-10-CM | POA: Diagnosis not present

## 2016-07-15 DIAGNOSIS — J449 Chronic obstructive pulmonary disease, unspecified: Secondary | ICD-10-CM | POA: Diagnosis not present

## 2016-07-15 DIAGNOSIS — D638 Anemia in other chronic diseases classified elsewhere: Secondary | ICD-10-CM | POA: Diagnosis not present

## 2016-07-15 DIAGNOSIS — R918 Other nonspecific abnormal finding of lung field: Secondary | ICD-10-CM | POA: Diagnosis not present

## 2016-07-15 DIAGNOSIS — M797 Fibromyalgia: Secondary | ICD-10-CM | POA: Diagnosis not present

## 2016-07-15 DIAGNOSIS — C3432 Malignant neoplasm of lower lobe, left bronchus or lung: Secondary | ICD-10-CM | POA: Diagnosis not present

## 2016-07-15 DIAGNOSIS — G629 Polyneuropathy, unspecified: Secondary | ICD-10-CM | POA: Diagnosis not present

## 2016-07-15 DIAGNOSIS — I509 Heart failure, unspecified: Secondary | ICD-10-CM | POA: Diagnosis not present

## 2016-07-15 DIAGNOSIS — L89323 Pressure ulcer of left buttock, stage 3: Secondary | ICD-10-CM | POA: Diagnosis not present

## 2016-07-15 DIAGNOSIS — I110 Hypertensive heart disease with heart failure: Secondary | ICD-10-CM | POA: Diagnosis not present

## 2016-07-15 DIAGNOSIS — F17210 Nicotine dependence, cigarettes, uncomplicated: Secondary | ICD-10-CM | POA: Diagnosis not present

## 2016-07-15 DIAGNOSIS — M069 Rheumatoid arthritis, unspecified: Secondary | ICD-10-CM | POA: Diagnosis not present

## 2016-07-15 DIAGNOSIS — N390 Urinary tract infection, site not specified: Secondary | ICD-10-CM | POA: Diagnosis not present

## 2016-07-16 DIAGNOSIS — I5022 Chronic systolic (congestive) heart failure: Secondary | ICD-10-CM | POA: Diagnosis not present

## 2016-07-16 DIAGNOSIS — R0602 Shortness of breath: Secondary | ICD-10-CM | POA: Diagnosis not present

## 2016-07-17 DIAGNOSIS — I110 Hypertensive heart disease with heart failure: Secondary | ICD-10-CM | POA: Diagnosis not present

## 2016-07-17 DIAGNOSIS — D638 Anemia in other chronic diseases classified elsewhere: Secondary | ICD-10-CM | POA: Diagnosis not present

## 2016-07-17 DIAGNOSIS — G629 Polyneuropathy, unspecified: Secondary | ICD-10-CM | POA: Diagnosis not present

## 2016-07-17 DIAGNOSIS — I509 Heart failure, unspecified: Secondary | ICD-10-CM | POA: Diagnosis not present

## 2016-07-17 DIAGNOSIS — N390 Urinary tract infection, site not specified: Secondary | ICD-10-CM | POA: Diagnosis not present

## 2016-07-17 DIAGNOSIS — M069 Rheumatoid arthritis, unspecified: Secondary | ICD-10-CM | POA: Diagnosis not present

## 2016-07-17 DIAGNOSIS — J449 Chronic obstructive pulmonary disease, unspecified: Secondary | ICD-10-CM | POA: Diagnosis not present

## 2016-07-17 DIAGNOSIS — M797 Fibromyalgia: Secondary | ICD-10-CM | POA: Diagnosis not present

## 2016-07-17 DIAGNOSIS — L89323 Pressure ulcer of left buttock, stage 3: Secondary | ICD-10-CM | POA: Diagnosis not present

## 2016-07-18 ENCOUNTER — Ambulatory Visit: Payer: Medicare HMO

## 2016-07-18 ENCOUNTER — Other Ambulatory Visit: Payer: Medicare HMO

## 2016-07-18 ENCOUNTER — Ambulatory Visit: Payer: Medicare HMO | Admitting: Oncology

## 2016-07-18 DIAGNOSIS — L89323 Pressure ulcer of left buttock, stage 3: Secondary | ICD-10-CM | POA: Diagnosis not present

## 2016-07-18 DIAGNOSIS — I110 Hypertensive heart disease with heart failure: Secondary | ICD-10-CM | POA: Diagnosis not present

## 2016-07-18 DIAGNOSIS — N390 Urinary tract infection, site not specified: Secondary | ICD-10-CM | POA: Diagnosis not present

## 2016-07-18 DIAGNOSIS — I509 Heart failure, unspecified: Secondary | ICD-10-CM | POA: Diagnosis not present

## 2016-07-21 ENCOUNTER — Ambulatory Visit
Admission: RE | Admit: 2016-07-21 | Discharge: 2016-07-21 | Disposition: A | Discharge status: Home / Self Care | Payer: Medicare HMO | Source: Ambulatory Visit | Attending: Radiation Oncology | Admitting: Radiation Oncology

## 2016-07-21 DIAGNOSIS — N390 Urinary tract infection, site not specified: Secondary | ICD-10-CM | POA: Diagnosis not present

## 2016-07-22 ENCOUNTER — Ambulatory Visit
Admission: RE | Admit: 2016-07-22 | Discharge: 2016-07-22 | Disposition: A | Discharge status: Home / Self Care | Payer: Medicare HMO | Source: Ambulatory Visit | Attending: Radiation Oncology | Admitting: Radiation Oncology

## 2016-07-22 DIAGNOSIS — I110 Hypertensive heart disease with heart failure: Secondary | ICD-10-CM | POA: Diagnosis not present

## 2016-07-22 DIAGNOSIS — I509 Heart failure, unspecified: Secondary | ICD-10-CM | POA: Diagnosis not present

## 2016-07-22 DIAGNOSIS — G629 Polyneuropathy, unspecified: Secondary | ICD-10-CM | POA: Diagnosis not present

## 2016-07-22 DIAGNOSIS — D638 Anemia in other chronic diseases classified elsewhere: Secondary | ICD-10-CM | POA: Diagnosis not present

## 2016-07-22 DIAGNOSIS — F17210 Nicotine dependence, cigarettes, uncomplicated: Secondary | ICD-10-CM | POA: Diagnosis not present

## 2016-07-22 DIAGNOSIS — R918 Other nonspecific abnormal finding of lung field: Secondary | ICD-10-CM | POA: Diagnosis not present

## 2016-07-22 DIAGNOSIS — Z510 Encounter for antineoplastic radiation therapy: Secondary | ICD-10-CM | POA: Diagnosis not present

## 2016-07-22 DIAGNOSIS — J449 Chronic obstructive pulmonary disease, unspecified: Secondary | ICD-10-CM | POA: Diagnosis not present

## 2016-07-22 DIAGNOSIS — C3432 Malignant neoplasm of lower lobe, left bronchus or lung: Secondary | ICD-10-CM | POA: Diagnosis not present

## 2016-07-22 DIAGNOSIS — L89323 Pressure ulcer of left buttock, stage 3: Secondary | ICD-10-CM | POA: Diagnosis not present

## 2016-07-22 DIAGNOSIS — M069 Rheumatoid arthritis, unspecified: Secondary | ICD-10-CM | POA: Diagnosis not present

## 2016-07-22 DIAGNOSIS — N390 Urinary tract infection, site not specified: Secondary | ICD-10-CM | POA: Diagnosis not present

## 2016-07-22 DIAGNOSIS — M797 Fibromyalgia: Secondary | ICD-10-CM | POA: Diagnosis not present

## 2016-07-23 ENCOUNTER — Ambulatory Visit
Admission: RE | Admit: 2016-07-23 | Discharge: 2016-07-23 | Disposition: A | Discharge status: Home / Self Care | Payer: Medicare HMO | Source: Ambulatory Visit | Attending: Radiation Oncology | Admitting: Radiation Oncology

## 2016-07-23 DIAGNOSIS — R918 Other nonspecific abnormal finding of lung field: Secondary | ICD-10-CM | POA: Diagnosis not present

## 2016-07-23 DIAGNOSIS — F17210 Nicotine dependence, cigarettes, uncomplicated: Secondary | ICD-10-CM | POA: Diagnosis not present

## 2016-07-23 DIAGNOSIS — Z510 Encounter for antineoplastic radiation therapy: Secondary | ICD-10-CM | POA: Diagnosis not present

## 2016-07-23 DIAGNOSIS — M797 Fibromyalgia: Secondary | ICD-10-CM | POA: Diagnosis not present

## 2016-07-23 DIAGNOSIS — C3432 Malignant neoplasm of lower lobe, left bronchus or lung: Secondary | ICD-10-CM | POA: Diagnosis not present

## 2016-07-23 DIAGNOSIS — G629 Polyneuropathy, unspecified: Secondary | ICD-10-CM | POA: Diagnosis not present

## 2016-07-23 DIAGNOSIS — I110 Hypertensive heart disease with heart failure: Secondary | ICD-10-CM | POA: Diagnosis not present

## 2016-07-23 DIAGNOSIS — J449 Chronic obstructive pulmonary disease, unspecified: Secondary | ICD-10-CM | POA: Diagnosis not present

## 2016-07-23 DIAGNOSIS — I509 Heart failure, unspecified: Secondary | ICD-10-CM | POA: Diagnosis not present

## 2016-07-24 ENCOUNTER — Ambulatory Visit: Payer: Medicare HMO | Admitting: Hematology and Oncology

## 2016-07-24 ENCOUNTER — Ambulatory Visit
Admission: RE | Admit: 2016-07-24 | Discharge: 2016-07-24 | Disposition: A | Discharge status: Home / Self Care | Payer: Medicare HMO | Source: Ambulatory Visit | Attending: Radiation Oncology | Admitting: Radiation Oncology

## 2016-07-24 ENCOUNTER — Inpatient Hospital Stay: Payer: Medicare HMO

## 2016-07-24 ENCOUNTER — Other Ambulatory Visit: Payer: Self-pay | Admitting: *Deleted

## 2016-07-24 ENCOUNTER — Encounter: Payer: Self-pay | Admitting: *Deleted

## 2016-07-24 ENCOUNTER — Inpatient Hospital Stay (HOSPITAL_BASED_OUTPATIENT_CLINIC_OR_DEPARTMENT_OTHER): Payer: Medicare HMO | Admitting: Hematology and Oncology

## 2016-07-24 ENCOUNTER — Inpatient Hospital Stay: Payer: Medicare HMO | Attending: Hematology and Oncology

## 2016-07-24 VITALS — BP 129/71 | HR 76 | Resp 18

## 2016-07-24 VITALS — BP 108/65 | HR 89 | Temp 96.5°F | Resp 18 | Wt 117.1 lb

## 2016-07-24 DIAGNOSIS — R05 Cough: Secondary | ICD-10-CM | POA: Insufficient documentation

## 2016-07-24 DIAGNOSIS — M797 Fibromyalgia: Secondary | ICD-10-CM | POA: Diagnosis not present

## 2016-07-24 DIAGNOSIS — Z79899 Other long term (current) drug therapy: Secondary | ICD-10-CM | POA: Insufficient documentation

## 2016-07-24 DIAGNOSIS — C3432 Malignant neoplasm of lower lobe, left bronchus or lung: Secondary | ICD-10-CM | POA: Insufficient documentation

## 2016-07-24 DIAGNOSIS — M069 Rheumatoid arthritis, unspecified: Secondary | ICD-10-CM | POA: Insufficient documentation

## 2016-07-24 DIAGNOSIS — R634 Abnormal weight loss: Secondary | ICD-10-CM | POA: Diagnosis not present

## 2016-07-24 DIAGNOSIS — Z853 Personal history of malignant neoplasm of breast: Secondary | ICD-10-CM | POA: Insufficient documentation

## 2016-07-24 DIAGNOSIS — I509 Heart failure, unspecified: Secondary | ICD-10-CM

## 2016-07-24 DIAGNOSIS — I700 Atherosclerosis of aorta: Secondary | ICD-10-CM | POA: Insufficient documentation

## 2016-07-24 DIAGNOSIS — R5381 Other malaise: Secondary | ICD-10-CM | POA: Insufficient documentation

## 2016-07-24 DIAGNOSIS — Z87440 Personal history of urinary (tract) infections: Secondary | ICD-10-CM

## 2016-07-24 DIAGNOSIS — Z9981 Dependence on supplemental oxygen: Secondary | ICD-10-CM

## 2016-07-24 DIAGNOSIS — R748 Abnormal levels of other serum enzymes: Secondary | ICD-10-CM | POA: Diagnosis not present

## 2016-07-24 DIAGNOSIS — I110 Hypertensive heart disease with heart failure: Secondary | ICD-10-CM

## 2016-07-24 DIAGNOSIS — J90 Pleural effusion, not elsewhere classified: Secondary | ICD-10-CM | POA: Diagnosis not present

## 2016-07-24 DIAGNOSIS — M810 Age-related osteoporosis without current pathological fracture: Secondary | ICD-10-CM | POA: Insufficient documentation

## 2016-07-24 DIAGNOSIS — Z923 Personal history of irradiation: Secondary | ICD-10-CM | POA: Diagnosis not present

## 2016-07-24 DIAGNOSIS — Z9223 Personal history of estrogen therapy: Secondary | ICD-10-CM | POA: Insufficient documentation

## 2016-07-24 DIAGNOSIS — Z7189 Other specified counseling: Secondary | ICD-10-CM

## 2016-07-24 DIAGNOSIS — Z880 Allergy status to penicillin: Secondary | ICD-10-CM

## 2016-07-24 DIAGNOSIS — F17210 Nicotine dependence, cigarettes, uncomplicated: Secondary | ICD-10-CM

## 2016-07-24 DIAGNOSIS — Z5111 Encounter for antineoplastic chemotherapy: Secondary | ICD-10-CM | POA: Insufficient documentation

## 2016-07-24 DIAGNOSIS — R5383 Other fatigue: Secondary | ICD-10-CM | POA: Diagnosis not present

## 2016-07-24 DIAGNOSIS — R042 Hemoptysis: Secondary | ICD-10-CM | POA: Insufficient documentation

## 2016-07-24 DIAGNOSIS — D649 Anemia, unspecified: Secondary | ICD-10-CM

## 2016-07-24 DIAGNOSIS — R918 Other nonspecific abnormal finding of lung field: Secondary | ICD-10-CM | POA: Diagnosis not present

## 2016-07-24 DIAGNOSIS — Z9221 Personal history of antineoplastic chemotherapy: Secondary | ICD-10-CM | POA: Diagnosis not present

## 2016-07-24 DIAGNOSIS — Z170 Estrogen receptor positive status [ER+]: Secondary | ICD-10-CM | POA: Diagnosis not present

## 2016-07-24 DIAGNOSIS — Z881 Allergy status to other antibiotic agents status: Secondary | ICD-10-CM | POA: Insufficient documentation

## 2016-07-24 DIAGNOSIS — J449 Chronic obstructive pulmonary disease, unspecified: Secondary | ICD-10-CM | POA: Insufficient documentation

## 2016-07-24 DIAGNOSIS — C50912 Malignant neoplasm of unspecified site of left female breast: Secondary | ICD-10-CM

## 2016-07-24 DIAGNOSIS — Z7952 Long term (current) use of systemic steroids: Secondary | ICD-10-CM

## 2016-07-24 DIAGNOSIS — G629 Polyneuropathy, unspecified: Secondary | ICD-10-CM | POA: Diagnosis not present

## 2016-07-24 DIAGNOSIS — Z510 Encounter for antineoplastic radiation therapy: Secondary | ICD-10-CM | POA: Diagnosis not present

## 2016-07-24 LAB — CBC WITH DIFFERENTIAL/PLATELET
Basophils Absolute: 0.1 10*3/uL (ref 0–0.1)
Basophils Relative: 1 %
Eosinophils Absolute: 0.4 10*3/uL (ref 0–0.7)
Eosinophils Relative: 6 %
HCT: 30.8 % — ABNORMAL LOW (ref 35.0–47.0)
Hemoglobin: 10.5 g/dL — ABNORMAL LOW (ref 12.0–16.0)
Lymphocytes Relative: 20 %
Lymphs Abs: 1.4 10*3/uL (ref 1.0–3.6)
MCH: 29.5 pg (ref 26.0–34.0)
MCHC: 34 g/dL (ref 32.0–36.0)
MCV: 86.7 fL (ref 80.0–100.0)
Monocytes Absolute: 0.7 10*3/uL (ref 0.2–0.9)
Monocytes Relative: 11 %
Neutro Abs: 4.3 10*3/uL (ref 1.4–6.5)
Neutrophils Relative %: 62 %
Platelets: 277 10*3/uL (ref 150–440)
RBC: 3.55 MIL/uL — ABNORMAL LOW (ref 3.80–5.20)
RDW: 15.4 % — ABNORMAL HIGH (ref 11.5–14.5)
WBC: 6.9 10*3/uL (ref 3.6–11.0)

## 2016-07-24 LAB — COMPREHENSIVE METABOLIC PANEL
ALT: 13 U/L — ABNORMAL LOW (ref 14–54)
AST: 22 U/L (ref 15–41)
Albumin: 3.1 g/dL — ABNORMAL LOW (ref 3.5–5.0)
Alkaline Phosphatase: 167 U/L — ABNORMAL HIGH (ref 38–126)
Anion gap: 9 (ref 5–15)
BUN: 20 mg/dL (ref 6–20)
CO2: 28 mmol/L (ref 22–32)
Calcium: 8.7 mg/dL — ABNORMAL LOW (ref 8.9–10.3)
Chloride: 95 mmol/L — ABNORMAL LOW (ref 101–111)
Creatinine, Ser: 1.32 mg/dL — ABNORMAL HIGH (ref 0.44–1.00)
GFR calc Af Amer: 45 mL/min — ABNORMAL LOW (ref >60)
GFR calc non Af Amer: 39 mL/min — ABNORMAL LOW (ref >60)
Glucose, Bld: 121 mg/dL — ABNORMAL HIGH (ref 65–99)
Potassium: 4.1 mmol/L (ref 3.5–5.1)
Sodium: 132 mmol/L — ABNORMAL LOW (ref 135–145)
Total Bilirubin: 0.2 mg/dL — ABNORMAL LOW (ref 0.3–1.2)
Total Protein: 6.6 g/dL (ref 6.5–8.1)

## 2016-07-24 LAB — MAGNESIUM: Magnesium: 1.9 mg/dL (ref 1.7–2.4)

## 2016-07-24 MED ORDER — SODIUM CHLORIDE 0.9 % IV SOLN
45.0000 mg/m2 | Freq: Once | INTRAVENOUS | Status: AC
  Administered 2016-07-24: 66 mg via INTRAVENOUS
  Filled 2016-07-24: qty 11

## 2016-07-24 MED ORDER — SODIUM CHLORIDE 0.9 % IV SOLN
Freq: Once | INTRAVENOUS | Status: AC
  Administered 2016-07-24: 11:00:00 via INTRAVENOUS
  Filled 2016-07-24: qty 1000

## 2016-07-24 MED ORDER — DIPHENHYDRAMINE HCL 50 MG/ML IJ SOLN
50.0000 mg | Freq: Once | INTRAMUSCULAR | Status: AC
  Administered 2016-07-24: 50 mg via INTRAVENOUS
  Filled 2016-07-24: qty 1

## 2016-07-24 MED ORDER — SODIUM CHLORIDE 0.9 % IV SOLN
110.0000 mg | Freq: Once | INTRAVENOUS | Status: AC
  Administered 2016-07-24: 110 mg via INTRAVENOUS
  Filled 2016-07-24: qty 11

## 2016-07-24 MED ORDER — FAMOTIDINE IN NACL 20-0.9 MG/50ML-% IV SOLN
20.0000 mg | Freq: Once | INTRAVENOUS | Status: AC
Start: 2016-07-24 — End: 2016-07-24
  Administered 2016-07-24: 20 mg via INTRAVENOUS

## 2016-07-24 MED ORDER — LORAZEPAM 0.5 MG PO TABS: 0.5000 mg | ORAL_TABLET | Freq: Four times a day (QID) | ORAL | 0 refills | Status: DC | PRN

## 2016-07-24 MED ORDER — PALONOSETRON HCL INJECTION 0.25 MG/5ML
0.2500 mg | Freq: Once | INTRAVENOUS | Status: AC
  Administered 2016-07-24: 0.25 mg via INTRAVENOUS
  Filled 2016-07-24: qty 5

## 2016-07-24 MED ORDER — ONDANSETRON HCL 8 MG PO TABS: 8.0000 mg | ORAL_TABLET | Freq: Two times a day (BID) | ORAL | 1 refills | Status: DC | PRN

## 2016-07-24 MED ORDER — SODIUM CHLORIDE 0.9 % IV SOLN
20.0000 mg | Freq: Once | INTRAVENOUS | Status: AC
  Administered 2016-07-24: 20 mg via INTRAVENOUS
  Filled 2016-07-24: qty 2

## 2016-07-24 NOTE — Progress Notes (Signed)
  Oncology Nurse Navigator Documentation  Navigator Location: [facility] (07/24/16 1000)   )Navigator Encounter Type: Follow-up Appt (07/24/16 1000)                   Treatment Initiated Date: 07/22/16 (07/24/16 1000) Patient Visit Type: MedOnc (07/24/16 1000) Treatment Phase: First Chemo Tx (07/24/16 1000)                Specialty Items/DME:  (dressing supplies) (07/24/16 1000)    met with patient prior to first chemo treatment. Pt has had 3 radiation treatments at this time and is tolerating them well. Pt states is uncomfortable lying on table for radiation treatments due to healed decubitus ulcer on coccyx. Pt states that area is very tender when under pressure. Pt was given dressings to put on area during radiation treatments to help relieve pressure. States today was her last day of smoking. Has no questions or needs regarding starting chemo today. Informed pt to call with any further needs or questions. Pt verbalized understanding.       Time Spent with Patient: 30 (07/24/16 1000)

## 2016-07-24 NOTE — Progress Notes (Signed)
[facility] day:  07/24/2016   Chief Complaint: [name] [name] is a 76 y.o. 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  HPI: The patient was last seen in the medical oncology clinic on 07/04/2106.  At that time, pathology revealed squamous cell lung cancer.  Subcarinal tissue was inadequate.  She was felt not to be a surgical candidate by Dr Genevive Bi. We discussed concurrent chemotherapy and radiation.  Head MRI on 07/07/2016 revealed no evidence of metastatic disease.  Radiation started 07/22/2016.  She declined port placement.  She quit smoking today.  Symptomatically, she notes being easily fatigued. She was recently placed on Macrobid for a UTI.   Past Medical History:  Diagnosis Date  . Asthma   . Breast cancer ([place]) 2009   left  . Cancer ([place])   . CHF (congestive heart failure) ([place])   . CHF (congestive heart failure) ([place])   . Chronic UTI   . COPD (chronic obstructive pulmonary disease) ([place])   . Dizziness   . Fibromyalgia   . Hypertension   . Neuropathy   . Personal history of tobacco use, presenting hazards to health 05/17/2015  . Polyp, larynx   . RA (rheumatoid arthritis) ([place])   . Sinus infection    recent  . Stumbling gait    to the left  . Supplemental oxygen dependent    2.5l    Past Surgical History:  Procedure Laterality Date  . ABDOMINAL HYSTERECTOMY    . BREAST LUMPECTOMY Left 2009   chemo and radiation  . CYST EXCISION Left 02/27/2015   Procedure: CYST REMOVAL;  Surgeon: Hessie Knows, MD;  Location: [facility] ORS;  Service: Orthopedics;  Laterality: Left;  . EYE MUSCLE SURGERY Right    13 surgeries  . FLEXIBLE BRONCHOSCOPY N/A 07/01/2016   Procedure: FLEXIBLE BRONCHOSCOPY;  Surgeon: Wilhelmina Mcardle, MD;  Location: [facility] ORS;  Service: Pulmonary;  Laterality: N/A;  . THUMB ARTHROSCOPY Left      Family History  Problem Relation Age of Onset  . Diabetes Father   . Stroke Father   . Heart attack Father   . CAD Sister     Social History:  reports that she has been smoking Cigarettes.  She has a 40.00 pack-year smoking history. She has never used smokeless tobacco. She reports that she does not drink alcohol or use drugs.  The patient is alone today.  Allergies:  Allergies  Allergen Reactions  . Contrast Media [Iodinated Diagnostic Agents] Other (See Comments)    Pt was sent to the ED following contrast media injection at [place]. Unknown reason. She has been premedicated since without complications. Pt to be premedicated prior to contrast media injections  . Ioxaglate Other (See Comments)    Pt was sent to the ED following contrast media injection at [place]. Unknown reason. She has been premedicated since without complications. Pt to be premedicated prior to contrast media injections  . Sulfa Antibiotics     Other reaction(s): Other (See Comments)  . Tetracycline Hives and Other (See Comments)  . White Petrolatum Other (See Comments)  . Amoxicillin-Pot Clavulanate Rash    Other reaction(s): Unknown Other reaction(s): Unknown _0  Other reaction(s): Unknown Blisters in mouth Blisters in mouth  . Tape Rash    Current  Medications: Current Outpatient Prescriptions  Medication Sig Dispense Refill  . Calcium-Vitamin D 600-200 MG-UNIT tablet Take 1 tablet by mouth daily.     . carvedilol (COREG) 12.5 MG tablet Take 1 tablet (12.5 mg total) by mouth 2 (two) times daily with a meal. 60 tablet 0  . cholecalciferol (VITAMIN D) 1000 units tablet Take 1,000 Units by mouth daily.    . clonazePAM (KLONOPIN) 1 MG tablet Take 1 mg by mouth 2 (two) times daily as needed for anxiety.     . gabapentin (NEURONTIN) 600 MG tablet Take 600 mg by mouth 3 (three) times daily.     [name]  HYDROcodone-acetaminophen (NORCO/VICODIN) 5-325 MG tablet Take 1-2 tablets by mouth every 4 (four) hours as needed for moderate pain.     [name] imipramine (TOFRANIL) 25 MG tablet Take 1 tablet by mouth at bedtime.     . iron polysaccharides (FERREX 150) 150 MG capsule Take 150 mg by mouth daily.    . Multiple Vitamin (MULTIVITAMIN) capsule Take 1 capsule by mouth daily.    . nitrofurantoin, macrocrystal-monohydrate, (MACROBID) 100 MG capsule Take 100 mg by mouth 2 (two) times daily.    . predniSONE (DELTASONE) 20 MG tablet Take 2 tablets (40 mg total) by mouth daily with breakfast. 10 days 20 tablet 0  . Tiotropium Bromide Monohydrate (SPIRIVA RESPIMAT) 2.5 MCG/ACT AERS Inhale 2.5 mcg into the lungs 2 (two) times daily. (Patient taking differently: Inhale 2.5 mcg into the lungs daily. ) 1 Inhaler 0  . traMADol (ULTRAM) 50 MG tablet Take 50 mg by mouth daily.     . vitamin C (ASCORBIC ACID) 500 MG tablet Take 500 mg by mouth daily.    [name] ZINC SULFATE PO Take 1 tablet by mouth daily.     [name] zolpidem (AMBIEN) 5 MG tablet Take 5 mg by mouth at bedtime.     No current facility-administered medications for this visit.     Review of Systems:  GENERAL:  Easily fatigued.  No fevers or sweats.  Weight loss of 3 pounds PERFORMANCE STATUS (ECOG):  2 HEENT:  No visual changes, runny nose, sore throat, mouth sores or tenderness. Lungs: Shortness of breath.  Cough.  No hemoptysis.  On oxygen 2 liter/min visa [place]. Cardiac:  No chest pain, palpitations, orthopnea, or PND. GI:  No nausea, vomiting, diarrhea, constipation, melena or hematochezia. GU:  No urgency, frequency, dysuria, or hematuria. Recent UTI on Macrobid. Musculoskeletal:  Fibromyalgia.  Osteoporosis.  No back pain.  No joint pain.  No muscle tenderness. Extremities:  No pain or swelling. Skin:  Decubitus.  No rashes or skin changes. Neuro:  No headache, numbness or weakness, balance or coordination issues. Endocrine:  No diabetes, thyroid issues,  hot flashes or night sweats. Psych:  No mood changes, depression or anxiety. Pain:  No focal pain. Review of systems:  All other systems reviewed and found to be negative.  Physical Exam: Blood pressure 108/65, pulse 89, temperature (!) 96.5 F (35.8 C), temperature source Tympanic, resp. rate 18, weight 117 lb 2 oz (53.1 kg). GENERAL:  Thin woman sitting comfortably in a wheelchair in the exam room in no acute distress. MENTAL STATUS:  Alert and oriented to person, place and time. HEAD:  Short graying blonde hair.  Normocephalic, atraumatic, face symmetric, no Cushingoid features. EYES:  Blue eyes.  No conjunctivitis or scleral icterus. ENT:  Oropharynx clear without lesion.  Tongue normal. Mucous membranes moist.  RESPIRATORY:  Clear to auscultation.  No rales, wheezes or  rhonchi. CARDIOVASCULAR:  Regular rate and rhythm without murmur, rub or gallop. SKIN:  No rashes, ulcers or lesions. EXTREMITIES: Arthritic changes in hands.  No edema, no skin discoloration or tenderness.  No palpable cords. LYMPH NODES: No palpable cervical, supraclavicular, axillary or inguinal adenopathy  NEUROLOGICAL: Unremarkable. PSYCH:  Appropriate.   Appointment on 07/24/2016  Component Date Value Ref Range Status  . WBC 07/24/2016 6.9  3.6 - 11.0 K/uL Final  . RBC 07/24/2016 3.55* 3.80 - 5.20 MIL/uL Final  . Hemoglobin 07/24/2016 10.5* 12.0 - 16.0 g/dL Final  . HCT 07/24/2016 30.8* 35.0 - 47.0 % Final  . MCV 07/24/2016 86.7  80.0 - 100.0 fL Final  . MCH 07/24/2016 29.5  26.0 - 34.0 pg Final  . MCHC 07/24/2016 34.0  32.0 - 36.0 g/dL Final  . RDW 07/24/2016 15.4* 11.5 - 14.5 % Final  . Platelets 07/24/2016 277  150 - 440 K/uL Final  . Neutrophils Relative % 07/24/2016 62  % Final  . Neutro Abs 07/24/2016 4.3  1.4 - 6.5 K/uL Final  . Lymphocytes Relative 07/24/2016 20  % Final  . Lymphs Abs 07/24/2016 1.4  1.0 - 3.6 K/uL Final  . Monocytes Relative 07/24/2016 11  % Final  . Monocytes Absolute  07/24/2016 0.7  0.2 - 0.9 K/uL Final  . Eosinophils Relative 07/24/2016 6  % Final  . Eosinophils Absolute 07/24/2016 0.4  0 - 0.7 K/uL Final  . Basophils Relative 07/24/2016 1  % Final  . Basophils Absolute 07/24/2016 0.1  0 - 0.1 K/uL Final  . Sodium 07/24/2016 132* 135 - 145 mmol/L Final  . Potassium 07/24/2016 4.1  3.5 - 5.1 mmol/L Final  . Chloride 07/24/2016 95* 101 - 111 mmol/L Final  . CO2 07/24/2016 28  22 - 32 mmol/L Final  . Glucose, Bld 07/24/2016 121* 65 - 99 mg/dL Final  . BUN 07/24/2016 20  6 - 20 mg/dL Final  . Creatinine, Ser 07/24/2016 1.32* 0.44 - 1.00 mg/dL Final  . Calcium 07/24/2016 8.7* 8.9 - 10.3 mg/dL Final  . Total Protein 07/24/2016 6.6  6.5 - 8.1 g/dL Final  . Albumin 07/24/2016 3.1* 3.5 - 5.0 g/dL Final  . AST 07/24/2016 22  15 - 41 U/L Final  . ALT 07/24/2016 13* 14 - 54 U/L Final  . Alkaline Phosphatase 07/24/2016 167* 38 - 126 U/L Final  . Total Bilirubin 07/24/2016 0.2* 0.3 - 1.2 mg/dL Final  . GFR calc non Af Amer 07/24/2016 39* >60 mL/min Final  . GFR calc Af Amer 07/24/2016 45* >60 mL/min Final   Comment: (NOTE) The eGFR has been calculated using the CKD EPI equation. This calculation has not been validated in all clinical situations. eGFR's persistently <60 mL/min signify possible Chronic Kidney Disease.   . Anion gap 07/24/2016 9  5 - 15 Final  . Magnesium 07/24/2016 1.9  1.7 - 2.4 mg/dL Final    Assessment:  [name] [name] is a 76 y.o. female with a clinical [id number] sqamous cell lung cancer of the left lung s/p bronchoscopy and biopsy on 07/01/2016.  She has a 40 pack year smoking history.  She presented with left lower chest wall pain.  Chest CT with contrast on 06/11/2016 revealed a 3.2 x 3.0 mass like area of focal opacity in the medial left lower lobe with obliteration of segmental airways to the anterior left lower lobe.  PET scan on 06/27/2016 revealed a 4.3 x 3.2 cm central left lower lobe lung lesion (SUV 14.3) consistent with  primary  bronchogenic carcinoma.  There was equivocal nodal tissue in the subcarinal station demonstrating mild hypermetabolism (SUV 3.6). There was more peripheral left lower lobe increased atelectasis with mucoid impaction which is likely secondary to endobronchial obstruction.  There was a small left pleural effusion.  Head MRI on 07/07/2016 revealed no evidence of metastatic disease.  She began radiation on 07/22/2016.  She has a history of stage IA left breast cancer in 02/2005.  She underwent lumpectomy.  Pathology revealed a T1cN0 lesion.  Tumor was ER + , PR +, and Her2/neu -.  Two sentinel lymph nodes were negative.  She received chemotherapy (4 cycles of AC and possibly an abbreviated course of Taxol- no records available).  She received radiation.  She completed Femara in 08/2010.  She has fibromyalgia.  She has advanced thoracoabdominal aortic atherosclerosis with fusiform dilatation of the descending thoracic aorta up to 4.3 cm.  She is followed by Dr Lucky Cowboy.  Symptomatically, she is easily fatigued.  Exam is stable.  Plan: 1.  Labs today:  CBC with diff, CMP, Mg. 2.  Discuss interval head MRI.  No evidence of metastatic disease. 3.  Review chemotherapy plan with concurrent low dose weekly carboplatin and Taxol. 4.  Discuss elevated alkaline phosphatase. Check fractionated alkaline phoshatase today 5.  Discuss anemia and work-up with lab draw next week. 6.  Week #1 carboplatin and Taxol 7.  RTC in 1 week for MD assess, labs (CBC with diff, CMP, Mg, ferritin, iron studies, B12, folate, TSH), and week #2 carboplatin and Taxol  Addendum:  Patient received Benadryl 50 mg with her premedications and became lethargic.  She received chemotherapy uneventfully after she woke up.  Future doses of Benadryl will be 25 mg.   Lequita Asal, MD  07/24/2016, 10:05 AM

## 2016-07-24 NOTE — Progress Notes (Signed)
Post Benadryl, patient lethargic with slurred speech. Patient states, "I feel like I did when I used to drink." MD, Dr. Mike Gip, notified and came to evaluate patient at chairside. Per MD order: do not give any further drugs at this time and let patient rest. MD is coming back to evaluate patient in one hour and will determine whether to start Taxol at that time.

## 2016-07-24 NOTE — Progress Notes (Signed)
While rounding, Lambert made initial visit with Pt in chair 18. Pt was waiting for treatment to begin. Pt gave a brief history of her cancer. Pt indicated that she was cold and mentioned a prayer shawl she had received several years ago. CH provided a new shawl for her. CH is available for follow up as needed.

## 2016-07-24 NOTE — Progress Notes (Signed)
Patient states she is itching on her back since starting radiation.  States she doesn't feel well today.  States she feels like she did not sleep off her sleeping pill last night.  C/o joint pain 8/10.  Currently on macrobid for UTI.

## 2016-07-25 ENCOUNTER — Ambulatory Visit
Admission: RE | Admit: 2016-07-25 | Discharge: 2016-07-25 | Disposition: A | Discharge status: Home / Self Care | Payer: Medicare HMO | Source: Ambulatory Visit | Attending: Radiation Oncology | Admitting: Radiation Oncology

## 2016-07-25 DIAGNOSIS — G629 Polyneuropathy, unspecified: Secondary | ICD-10-CM | POA: Diagnosis not present

## 2016-07-25 DIAGNOSIS — L89323 Pressure ulcer of left buttock, stage 3: Secondary | ICD-10-CM | POA: Diagnosis not present

## 2016-07-25 DIAGNOSIS — M069 Rheumatoid arthritis, unspecified: Secondary | ICD-10-CM | POA: Diagnosis not present

## 2016-07-25 DIAGNOSIS — D638 Anemia in other chronic diseases classified elsewhere: Secondary | ICD-10-CM | POA: Diagnosis not present

## 2016-07-25 DIAGNOSIS — C3432 Malignant neoplasm of lower lobe, left bronchus or lung: Secondary | ICD-10-CM | POA: Diagnosis not present

## 2016-07-25 DIAGNOSIS — R918 Other nonspecific abnormal finding of lung field: Secondary | ICD-10-CM | POA: Diagnosis not present

## 2016-07-25 DIAGNOSIS — I509 Heart failure, unspecified: Secondary | ICD-10-CM | POA: Diagnosis not present

## 2016-07-25 DIAGNOSIS — Z510 Encounter for antineoplastic radiation therapy: Secondary | ICD-10-CM | POA: Diagnosis not present

## 2016-07-25 DIAGNOSIS — N390 Urinary tract infection, site not specified: Secondary | ICD-10-CM | POA: Diagnosis not present

## 2016-07-25 DIAGNOSIS — M797 Fibromyalgia: Secondary | ICD-10-CM | POA: Diagnosis not present

## 2016-07-25 DIAGNOSIS — I110 Hypertensive heart disease with heart failure: Secondary | ICD-10-CM | POA: Diagnosis not present

## 2016-07-25 DIAGNOSIS — J449 Chronic obstructive pulmonary disease, unspecified: Secondary | ICD-10-CM | POA: Diagnosis not present

## 2016-07-25 DIAGNOSIS — F17210 Nicotine dependence, cigarettes, uncomplicated: Secondary | ICD-10-CM | POA: Diagnosis not present

## 2016-07-26 LAB — ALKALINE PHOSPHATASE, ISOENZYMES
Alk Phos Bone Fract: 29 % (ref 14–68)
Alk Phos Liver Fract: 62 % (ref 18–85)
Alk Phos: 193 IU/L — ABNORMAL HIGH (ref 39–117)
Intestinal %: 9 % (ref 0–18)

## 2016-07-27 DIAGNOSIS — I509 Heart failure, unspecified: Secondary | ICD-10-CM | POA: Diagnosis not present

## 2016-07-27 DIAGNOSIS — I110 Hypertensive heart disease with heart failure: Secondary | ICD-10-CM | POA: Diagnosis not present

## 2016-07-27 DIAGNOSIS — D638 Anemia in other chronic diseases classified elsewhere: Secondary | ICD-10-CM | POA: Diagnosis not present

## 2016-07-27 DIAGNOSIS — M069 Rheumatoid arthritis, unspecified: Secondary | ICD-10-CM | POA: Diagnosis not present

## 2016-07-27 DIAGNOSIS — J449 Chronic obstructive pulmonary disease, unspecified: Secondary | ICD-10-CM | POA: Diagnosis not present

## 2016-07-27 DIAGNOSIS — M797 Fibromyalgia: Secondary | ICD-10-CM | POA: Diagnosis not present

## 2016-07-27 DIAGNOSIS — C3432 Malignant neoplasm of lower lobe, left bronchus or lung: Secondary | ICD-10-CM | POA: Diagnosis not present

## 2016-07-27 DIAGNOSIS — G629 Polyneuropathy, unspecified: Secondary | ICD-10-CM | POA: Diagnosis not present

## 2016-07-27 DIAGNOSIS — J45909 Unspecified asthma, uncomplicated: Secondary | ICD-10-CM | POA: Diagnosis not present

## 2016-07-28 ENCOUNTER — Ambulatory Visit: Admission: RE | Admit: 2016-07-28 | Payer: Medicare HMO | Source: Ambulatory Visit

## 2016-07-29 ENCOUNTER — Other Ambulatory Visit: Payer: Self-pay | Admitting: *Deleted

## 2016-07-29 ENCOUNTER — Ambulatory Visit
Admission: RE | Admit: 2016-07-29 | Discharge: 2016-07-29 | Disposition: A | Discharge status: Home / Self Care | Payer: Medicare HMO | Source: Ambulatory Visit | Attending: Radiation Oncology | Admitting: Radiation Oncology

## 2016-07-29 DIAGNOSIS — I110 Hypertensive heart disease with heart failure: Secondary | ICD-10-CM | POA: Diagnosis not present

## 2016-07-29 DIAGNOSIS — J449 Chronic obstructive pulmonary disease, unspecified: Secondary | ICD-10-CM | POA: Diagnosis not present

## 2016-07-29 DIAGNOSIS — Z510 Encounter for antineoplastic radiation therapy: Secondary | ICD-10-CM | POA: Diagnosis not present

## 2016-07-29 DIAGNOSIS — M797 Fibromyalgia: Secondary | ICD-10-CM | POA: Diagnosis not present

## 2016-07-29 DIAGNOSIS — J45909 Unspecified asthma, uncomplicated: Secondary | ICD-10-CM | POA: Diagnosis not present

## 2016-07-29 DIAGNOSIS — M069 Rheumatoid arthritis, unspecified: Secondary | ICD-10-CM | POA: Diagnosis not present

## 2016-07-29 DIAGNOSIS — D638 Anemia in other chronic diseases classified elsewhere: Secondary | ICD-10-CM | POA: Diagnosis not present

## 2016-07-29 DIAGNOSIS — I509 Heart failure, unspecified: Secondary | ICD-10-CM | POA: Diagnosis not present

## 2016-07-29 DIAGNOSIS — F17210 Nicotine dependence, cigarettes, uncomplicated: Secondary | ICD-10-CM | POA: Diagnosis not present

## 2016-07-29 DIAGNOSIS — C3432 Malignant neoplasm of lower lobe, left bronchus or lung: Secondary | ICD-10-CM | POA: Diagnosis not present

## 2016-07-29 DIAGNOSIS — G629 Polyneuropathy, unspecified: Secondary | ICD-10-CM | POA: Diagnosis not present

## 2016-07-29 DIAGNOSIS — R918 Other nonspecific abnormal finding of lung field: Secondary | ICD-10-CM | POA: Diagnosis not present

## 2016-07-29 MED ORDER — SUCRALFATE 1 G PO TABS: 1.0000 g | ORAL_TABLET | Freq: Three times a day (TID) | ORAL | 6 refills | Status: DC

## 2016-07-30 ENCOUNTER — Ambulatory Visit
Admission: RE | Admit: 2016-07-30 | Discharge: 2016-07-30 | Disposition: A | Discharge status: Home / Self Care | Payer: Medicare HMO | Source: Ambulatory Visit | Attending: Radiation Oncology | Admitting: Radiation Oncology

## 2016-07-30 DIAGNOSIS — F17210 Nicotine dependence, cigarettes, uncomplicated: Secondary | ICD-10-CM | POA: Diagnosis not present

## 2016-07-30 DIAGNOSIS — G629 Polyneuropathy, unspecified: Secondary | ICD-10-CM | POA: Diagnosis not present

## 2016-07-30 DIAGNOSIS — Z510 Encounter for antineoplastic radiation therapy: Secondary | ICD-10-CM | POA: Diagnosis not present

## 2016-07-30 DIAGNOSIS — I509 Heart failure, unspecified: Secondary | ICD-10-CM | POA: Diagnosis not present

## 2016-07-30 DIAGNOSIS — M797 Fibromyalgia: Secondary | ICD-10-CM | POA: Diagnosis not present

## 2016-07-30 DIAGNOSIS — C3432 Malignant neoplasm of lower lobe, left bronchus or lung: Secondary | ICD-10-CM | POA: Diagnosis not present

## 2016-07-30 DIAGNOSIS — R918 Other nonspecific abnormal finding of lung field: Secondary | ICD-10-CM | POA: Diagnosis not present

## 2016-07-30 DIAGNOSIS — I110 Hypertensive heart disease with heart failure: Secondary | ICD-10-CM | POA: Diagnosis not present

## 2016-07-30 DIAGNOSIS — J449 Chronic obstructive pulmonary disease, unspecified: Secondary | ICD-10-CM | POA: Diagnosis not present

## 2016-07-31 ENCOUNTER — Other Ambulatory Visit: Payer: Self-pay | Admitting: Hematology and Oncology

## 2016-07-31 ENCOUNTER — Encounter: Payer: Self-pay | Admitting: Hematology and Oncology

## 2016-07-31 ENCOUNTER — Inpatient Hospital Stay (HOSPITAL_BASED_OUTPATIENT_CLINIC_OR_DEPARTMENT_OTHER): Payer: Medicare HMO | Admitting: Hematology and Oncology

## 2016-07-31 ENCOUNTER — Ambulatory Visit
Admission: RE | Admit: 2016-07-31 | Discharge: 2016-07-31 | Disposition: A | Discharge status: Home / Self Care | Payer: Medicare HMO | Source: Ambulatory Visit | Attending: Radiation Oncology | Admitting: Radiation Oncology

## 2016-07-31 ENCOUNTER — Inpatient Hospital Stay: Payer: Medicare HMO

## 2016-07-31 VITALS — BP 136/82 | HR 106 | Temp 98.2°F | Resp 18 | Wt 112.2 lb

## 2016-07-31 DIAGNOSIS — R748 Abnormal levels of other serum enzymes: Secondary | ICD-10-CM | POA: Diagnosis not present

## 2016-07-31 DIAGNOSIS — I700 Atherosclerosis of aorta: Secondary | ICD-10-CM

## 2016-07-31 DIAGNOSIS — I509 Heart failure, unspecified: Secondary | ICD-10-CM

## 2016-07-31 DIAGNOSIS — F17210 Nicotine dependence, cigarettes, uncomplicated: Secondary | ICD-10-CM

## 2016-07-31 DIAGNOSIS — Z87440 Personal history of urinary (tract) infections: Secondary | ICD-10-CM

## 2016-07-31 DIAGNOSIS — Z5111 Encounter for antineoplastic chemotherapy: Secondary | ICD-10-CM

## 2016-07-31 DIAGNOSIS — J449 Chronic obstructive pulmonary disease, unspecified: Secondary | ICD-10-CM | POA: Diagnosis not present

## 2016-07-31 DIAGNOSIS — Z170 Estrogen receptor positive status [ER+]: Secondary | ICD-10-CM

## 2016-07-31 DIAGNOSIS — Z9221 Personal history of antineoplastic chemotherapy: Secondary | ICD-10-CM

## 2016-07-31 DIAGNOSIS — R634 Abnormal weight loss: Secondary | ICD-10-CM | POA: Diagnosis not present

## 2016-07-31 DIAGNOSIS — Z510 Encounter for antineoplastic radiation therapy: Secondary | ICD-10-CM | POA: Diagnosis not present

## 2016-07-31 DIAGNOSIS — M797 Fibromyalgia: Secondary | ICD-10-CM

## 2016-07-31 DIAGNOSIS — D649 Anemia, unspecified: Secondary | ICD-10-CM | POA: Diagnosis not present

## 2016-07-31 DIAGNOSIS — Z9981 Dependence on supplemental oxygen: Secondary | ICD-10-CM | POA: Diagnosis not present

## 2016-07-31 DIAGNOSIS — Z853 Personal history of malignant neoplasm of breast: Secondary | ICD-10-CM

## 2016-07-31 DIAGNOSIS — Z79899 Other long term (current) drug therapy: Secondary | ICD-10-CM

## 2016-07-31 DIAGNOSIS — R5383 Other fatigue: Secondary | ICD-10-CM

## 2016-07-31 DIAGNOSIS — C3432 Malignant neoplasm of lower lobe, left bronchus or lung: Secondary | ICD-10-CM | POA: Diagnosis not present

## 2016-07-31 DIAGNOSIS — Z880 Allergy status to penicillin: Secondary | ICD-10-CM

## 2016-07-31 DIAGNOSIS — Z7189 Other specified counseling: Secondary | ICD-10-CM

## 2016-07-31 DIAGNOSIS — M069 Rheumatoid arthritis, unspecified: Secondary | ICD-10-CM

## 2016-07-31 DIAGNOSIS — I110 Hypertensive heart disease with heart failure: Secondary | ICD-10-CM

## 2016-07-31 DIAGNOSIS — Z9223 Personal history of estrogen therapy: Secondary | ICD-10-CM

## 2016-07-31 DIAGNOSIS — Z923 Personal history of irradiation: Secondary | ICD-10-CM | POA: Diagnosis not present

## 2016-07-31 DIAGNOSIS — Z881 Allergy status to other antibiotic agents status: Secondary | ICD-10-CM

## 2016-07-31 DIAGNOSIS — R918 Other nonspecific abnormal finding of lung field: Secondary | ICD-10-CM | POA: Diagnosis not present

## 2016-07-31 DIAGNOSIS — G629 Polyneuropathy, unspecified: Secondary | ICD-10-CM | POA: Diagnosis not present

## 2016-07-31 DIAGNOSIS — Z7952 Long term (current) use of systemic steroids: Secondary | ICD-10-CM

## 2016-07-31 LAB — COMPREHENSIVE METABOLIC PANEL
ALT: 12 U/L — ABNORMAL LOW (ref 14–54)
AST: 20 U/L (ref 15–41)
Albumin: 3.4 g/dL — ABNORMAL LOW (ref 3.5–5.0)
Alkaline Phosphatase: 148 U/L — ABNORMAL HIGH (ref 38–126)
Anion gap: 8 (ref 5–15)
BUN: 19 mg/dL (ref 6–20)
CO2: 29 mmol/L (ref 22–32)
Calcium: 9.2 mg/dL (ref 8.9–10.3)
Chloride: 97 mmol/L — ABNORMAL LOW (ref 101–111)
Creatinine, Ser: 1.2 mg/dL — ABNORMAL HIGH (ref 0.44–1.00)
GFR calc Af Amer: 51 mL/min — ABNORMAL LOW (ref >60)
GFR calc non Af Amer: 44 mL/min — ABNORMAL LOW (ref >60)
Glucose, Bld: 119 mg/dL — ABNORMAL HIGH (ref 65–99)
Potassium: 4 mmol/L (ref 3.5–5.1)
Sodium: 134 mmol/L — ABNORMAL LOW (ref 135–145)
Total Bilirubin: 0.5 mg/dL (ref 0.3–1.2)
Total Protein: 7.5 g/dL (ref 6.5–8.1)

## 2016-07-31 LAB — TSH: TSH: 0.649 u[IU]/mL (ref 0.350–4.500)

## 2016-07-31 LAB — CBC WITH DIFFERENTIAL/PLATELET
Basophils Absolute: 0.1 10*3/uL (ref 0–0.1)
Basophils Relative: 1 %
Eosinophils Absolute: 0.2 10*3/uL (ref 0–0.7)
Eosinophils Relative: 3 %
HCT: 33.9 % — ABNORMAL LOW (ref 35.0–47.0)
Hemoglobin: 11.6 g/dL — ABNORMAL LOW (ref 12.0–16.0)
Lymphocytes Relative: 11 %
Lymphs Abs: 0.7 10*3/uL — ABNORMAL LOW (ref 1.0–3.6)
MCH: 29.7 pg (ref 26.0–34.0)
MCHC: 34.1 g/dL (ref 32.0–36.0)
MCV: 86.9 fL (ref 80.0–100.0)
Monocytes Absolute: 0.6 10*3/uL (ref 0.2–0.9)
Monocytes Relative: 10 %
Neutro Abs: 4.9 10*3/uL (ref 1.4–6.5)
Neutrophils Relative %: 75 %
Platelets: 386 10*3/uL (ref 150–440)
RBC: 3.9 MIL/uL (ref 3.80–5.20)
RDW: 15.7 % — ABNORMAL HIGH (ref 11.5–14.5)
WBC: 6.4 10*3/uL (ref 3.6–11.0)

## 2016-07-31 LAB — IRON AND TIBC
Iron: 23 ug/dL — ABNORMAL LOW (ref 28–170)
Saturation Ratios: 8 % — ABNORMAL LOW (ref 10.4–31.8)
TIBC: 281 ug/dL (ref 250–450)
UIBC: 258 ug/dL

## 2016-07-31 LAB — FOLATE: Folate: 19.7 ng/mL (ref >5.9)

## 2016-07-31 LAB — VITAMIN B12: Vitamin B-12: 345 pg/mL (ref 180–914)

## 2016-07-31 LAB — FERRITIN: Ferritin: 103 ng/mL (ref 11–307)

## 2016-07-31 LAB — MAGNESIUM: Magnesium: 2.1 mg/dL (ref 1.7–2.4)

## 2016-07-31 MED ORDER — FAMOTIDINE IN NACL 20-0.9 MG/50ML-% IV SOLN
20.0000 mg | Freq: Once | INTRAVENOUS | Status: AC
  Administered 2016-07-31: 20 mg via INTRAVENOUS
  Filled 2016-07-31: qty 50

## 2016-07-31 MED ORDER — SODIUM CHLORIDE 0.9 % IV SOLN
45.0000 mg/m2 | Freq: Once | INTRAVENOUS | Status: AC
  Administered 2016-07-31: 66 mg via INTRAVENOUS
  Filled 2016-07-31: qty 11

## 2016-07-31 MED ORDER — CARBOPLATIN CHEMO INJECTION 450 MG/45ML
110.0000 mg | Freq: Once | INTRAVENOUS | Status: AC
  Administered 2016-07-31: 110 mg via INTRAVENOUS
  Filled 2016-07-31: qty 11

## 2016-07-31 MED ORDER — DIPHENHYDRAMINE HCL 50 MG/ML IJ SOLN
25.0000 mg | Freq: Once | INTRAMUSCULAR | Status: AC
  Administered 2016-07-31: 25 mg via INTRAVENOUS
  Filled 2016-07-31: qty 1

## 2016-07-31 MED ORDER — DEXAMETHASONE SODIUM PHOSPHATE 100 MG/10ML IJ SOLN
20.0000 mg | Freq: Once | INTRAMUSCULAR | Status: AC
  Administered 2016-07-31: 20 mg via INTRAVENOUS
  Filled 2016-07-31: qty 2

## 2016-07-31 MED ORDER — PALONOSETRON HCL INJECTION 0.25 MG/5ML
0.2500 mg | Freq: Once | INTRAVENOUS | Status: AC
  Administered 2016-07-31: 0.25 mg via INTRAVENOUS
  Filled 2016-07-31: qty 5

## 2016-07-31 MED ORDER — SODIUM CHLORIDE 0.9 % IV SOLN
Freq: Once | INTRAVENOUS | Status: AC
Start: 2016-07-31 — End: 2016-07-31
  Administered 2016-07-31: 11:00:00 via INTRAVENOUS
  Filled 2016-07-31: qty 1000

## 2016-07-31 NOTE — Progress Notes (Signed)
Patient states she sleeps a lot.  She states she is having intermittent pain in her left ribcage.

## 2016-07-31 NOTE — Progress Notes (Signed)
[facility] day:  07/31/2016   Chief Complaint: [name] [name] is a 76 y.o. 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  HPI: The patient was last seen in the medical oncology clinic on 07/24/2016.  At that time, she began week #1 chemotherapy.  She tolerated chemotherapy well.  She experienced lethargy with her premedications (Benadryl 50 mg).  During the interim, she has been sleeping a lot. She denies any nausea. She denies narcolepsy. She lives with her son and grandson. She has some tenderness in her ribs.   Past Medical History:  Diagnosis Date  . Asthma   . Breast cancer ([place]) 2009   left  . Cancer ([place])   . CHF (congestive heart failure) ([place])   . CHF (congestive heart failure) ([place])   . Chronic UTI   . COPD (chronic obstructive pulmonary disease) ([place])   . Dizziness   . Fibromyalgia   . Hypertension   . Neuropathy   . Personal history of tobacco use, presenting hazards to health 05/17/2015  . Polyp, larynx   . RA (rheumatoid arthritis) ([place])   . Sinus infection    recent  . Stumbling gait    to the left  . Supplemental oxygen dependent    2.5l    Past Surgical History:  Procedure Laterality Date  . ABDOMINAL HYSTERECTOMY    . BREAST LUMPECTOMY Left 2009   chemo and radiation  . CYST EXCISION Left 02/27/2015   Procedure: CYST REMOVAL;  Surgeon: Hessie Knows, MD;  Location: [facility] ORS;  Service: Orthopedics;  Laterality: Left;  . EYE MUSCLE SURGERY Right    13 surgeries  . FLEXIBLE BRONCHOSCOPY N/A 07/01/2016   Procedure: FLEXIBLE BRONCHOSCOPY;  Surgeon: Wilhelmina Mcardle, MD;  Location: [facility] ORS;  Service: Pulmonary;  Laterality: N/A;  . THUMB ARTHROSCOPY Left     Family History  Problem Relation Age of Onset  . Diabetes Father   . Stroke Father   . Heart attack Father   . CAD Sister      Social History:  reports that she has been smoking Cigarettes.  She has a 40.00 pack-year smoking history. She has never used smokeless tobacco. She reports that she does not drink alcohol or use drugs.  He lives with her son and grandson. The patient is alone today.   Allergies:  Allergies  Allergen Reactions  . Contrast Media [Iodinated Diagnostic Agents] Other (See Comments)    Pt was sent to the ED following contrast media injection at [place]. Unknown reason. She has been premedicated since without complications. Pt to be premedicated prior to contrast media injections  . Ioxaglate Other (See Comments)    Pt was sent to the ED following contrast media injection at [place]. Unknown reason. She has been premedicated since without complications. Pt to be premedicated prior to contrast media injections  . Sulfa Antibiotics     Other reaction(s): Other (See Comments)  . Tetracycline Hives and Other (See Comments)  . White Petrolatum Other (See Comments)  . Amoxicillin-Pot Clavulanate Rash    Other reaction(s): Unknown Other reaction(s): Unknown _0  Other reaction(s): Unknown Blisters in mouth Blisters in mouth  . Tape Rash    Current Medications: Current Outpatient Prescriptions  Medication Sig Dispense Refill  .  Calcium-Vitamin D 600-200 MG-UNIT tablet Take 1 tablet by mouth daily.     . carvedilol (COREG) 12.5 MG tablet Take 1 tablet (12.5 mg total) by mouth 2 (two) times daily with a meal. 60 tablet 0  . cholecalciferol (VITAMIN D) 1000 units tablet Take 1,000 Units by mouth daily.    . clonazePAM (KLONOPIN) 1 MG tablet Take 1 mg by mouth 2 (two) times daily as needed for anxiety.     . gabapentin (NEURONTIN) 600 MG tablet Take 600 mg by mouth 3 (three) times daily.     [name] HYDROcodone-acetaminophen (NORCO/VICODIN) 5-325 MG tablet Take 1-2 tablets by mouth every 4  (four) hours as needed for moderate pain.     [name] imipramine (TOFRANIL) 25 MG tablet Take 1 tablet by mouth at bedtime.     . iron polysaccharides (FERREX 150) 150 MG capsule Take 150 mg by mouth daily.    [name] LORazepam (ATIVAN) 0.5 MG tablet Take 1 tablet (0.5 mg total) by mouth every 6 (six) hours as needed (Nausea or vomiting). 30 tablet 0  . Multiple Vitamin (MULTIVITAMIN) capsule Take 1 capsule by mouth daily.    . nitrofurantoin, macrocrystal-monohydrate, (MACROBID) 100 MG capsule Take 100 mg by mouth 2 (two) times daily.    . ondansetron (ZOFRAN) 8 MG tablet Take 1 tablet (8 mg total) by mouth 2 (two) times daily as needed for refractory nausea / vomiting. Start on day 3 after chemo. 30 tablet 1  . predniSONE (DELTASONE) 20 MG tablet Take 2 tablets (40 mg total) by mouth daily with breakfast. 10 days 20 tablet 0  . sucralfate (CARAFATE) 1 g tablet Take 1 tablet (1 g total) by mouth 3 (three) times daily. 90 tablet 6  . Tiotropium Bromide Monohydrate (SPIRIVA RESPIMAT) 2.5 MCG/ACT AERS Inhale 2.5 mcg into the lungs 2 (two) times daily. (Patient taking differently: Inhale 2.5 mcg into the lungs daily. ) 1 Inhaler 0  . traMADol (ULTRAM) 50 MG tablet Take 50 mg by mouth daily.     . vitamin C (ASCORBIC ACID) 500 MG tablet Take 500 mg by mouth daily.    [name] ZINC SULFATE PO Take 1 tablet by mouth daily.     [name] zolpidem (AMBIEN) 5 MG tablet Take 5 mg by mouth at bedtime.     No current facility-administered medications for this visit.     Review of Systems:  GENERAL:  Sleepy.  No fevers or sweats.  Weight loss of 5 pounds. PERFORMANCE STATUS (ECOG):  2 HEENT:  No visual changes, runny nose, sore throat, mouth sores or tenderness. Lungs: Shortness of breath (stable).  Cough.  No hemoptysis.  On oxygen 2 liter/min visa [place]. Cardiac:  No chest pain, palpitations, orthopnea, or PND. GI:  No nausea, vomiting, diarrhea, constipation, melena or hematochezia. GU:  No urgency, frequency, dysuria, or  hematuria. Recent UTI. Musculoskeletal:  Fibromyalgia.  Osteoporosis.  No back pain.  No joint pain.  No muscle tenderness. Extremities:  No pain or swelling. Skin:  Decubitus.  No rashes or skin changes. Neuro:  No headache, numbness or weakness, balance or coordination issues. Endocrine:  No diabetes, thyroid issues, hot flashes or night sweats. Psych:  No mood changes, depression or anxiety. Pain:  No focal pain. Review of systems:  All other systems reviewed and found to be negative.  Physical Exam: Blood pressure 136/82, pulse (!) 106, temperature 98.2 F (36.8 C), temperature source Tympanic, resp. rate 18, weight 112 lb 4 oz (50.9 kg). GENERAL:  Thin woman  sitting comfortably in a wheelchair in the exam room in no acute distress. MENTAL STATUS:  Alert and oriented to person, place and time. HEAD:  Short blonde hair.  Normocephalic, atraumatic, face symmetric, no Cushingoid features. EYES:  Blue eyes.  No conjunctivitis or scleral icterus. ENT:  Oropharynx clear without lesion.  Tongue normal. Mucous membranes moist.  RESPIRATORY:  Clear to auscultation.  No rales, wheezes or rhonchi. CARDIOVASCULAR:  Regular rate and rhythm without murmur, rub or gallop. SKIN:  No rashes, ulcers or lesions. EXTREMITIES: Arthritic changes in hands.  Tender to palpation.  No edema, no skin discoloration or tenderness.  No palpable cords. LYMPH NODES: No palpable cervical, supraclavicular, axillary or inguinal adenopathy  NEUROLOGICAL: Alert and interactive.  Unremarkable. PSYCH:  Appropriate.   Appointment on 07/31/2016  Component Date Value Ref Range Status  . Sodium 07/31/2016 134* 135 - 145 mmol/L Final  . Potassium 07/31/2016 4.0  3.5 - 5.1 mmol/L Final  . Chloride 07/31/2016 97* 101 - 111 mmol/L Final  . CO2 07/31/2016 29  22 - 32 mmol/L Final  . Glucose, Bld 07/31/2016 119* 65 - 99 mg/dL Final  . BUN 07/31/2016 19  6 - 20 mg/dL Final  . Creatinine, Ser 07/31/2016 1.20* 0.44 - 1.00 mg/dL  Final  . Calcium 07/31/2016 9.2  8.9 - 10.3 mg/dL Final  . Total Protein 07/31/2016 7.5  6.5 - 8.1 g/dL Final  . Albumin 07/31/2016 3.4* 3.5 - 5.0 g/dL Final  . AST 07/31/2016 20  15 - 41 U/L Final  . ALT 07/31/2016 12* 14 - 54 U/L Final  . Alkaline Phosphatase 07/31/2016 148* 38 - 126 U/L Final  . Total Bilirubin 07/31/2016 0.5  0.3 - 1.2 mg/dL Final  . GFR calc non Af Amer 07/31/2016 44* >60 mL/min Final  . GFR calc Af Amer 07/31/2016 51* >60 mL/min Final   Comment: (NOTE) The eGFR has been calculated using the CKD EPI equation. This calculation has not been validated in all clinical situations. eGFR's persistently <60 mL/min signify possible Chronic Kidney Disease.   . Anion gap 07/31/2016 8  5 - 15 Final  . WBC 07/31/2016 6.4  3.6 - 11.0 K/uL Final  . RBC 07/31/2016 3.90  3.80 - 5.20 MIL/uL Final  . Hemoglobin 07/31/2016 11.6* 12.0 - 16.0 g/dL Final  . HCT 07/31/2016 33.9* 35.0 - 47.0 % Final  . MCV 07/31/2016 86.9  80.0 - 100.0 fL Final  . MCH 07/31/2016 29.7  26.0 - 34.0 pg Final  . MCHC 07/31/2016 34.1  32.0 - 36.0 g/dL Final  . RDW 07/31/2016 15.7* 11.5 - 14.5 % Final  . Platelets 07/31/2016 386  150 - 440 K/uL Final  . Neutrophils Relative % 07/31/2016 75  % Final  . Neutro Abs 07/31/2016 4.9  1.4 - 6.5 K/uL Final  . Lymphocytes Relative 07/31/2016 11  % Final  . Lymphs Abs 07/31/2016 0.7* 1.0 - 3.6 K/uL Final  . Monocytes Relative 07/31/2016 10  % Final  . Monocytes Absolute 07/31/2016 0.6  0.2 - 0.9 K/uL Final  . Eosinophils Relative 07/31/2016 3  % Final  . Eosinophils Absolute 07/31/2016 0.2  0 - 0.7 K/uL Final  . Basophils Relative 07/31/2016 1  % Final  . Basophils Absolute 07/31/2016 0.1  0 - 0.1 K/uL Final  . Magnesium 07/31/2016 2.1  1.7 - 2.4 mg/dL Final    Assessment:  TANYSHA QUANT is a 76 y.o. female with a clinical T2bNxM0 sqamous cell lung cancer of the left lung  s/p bronchoscopy and biopsy on 07/01/2016.  She has a 40 pack year smoking history.  She  presented with left lower chest wall pain.  Chest CT with contrast on 06/11/2016 revealed a 3.2 x 3.0 mass like area of focal opacity in the medial left lower lobe with obliteration of segmental airways to the anterior left lower lobe.  PET scan on 06/27/2016 revealed a 4.3 x 3.2 cm central left lower lobe lung lesion (SUV 14.3) consistent with primary bronchogenic carcinoma.  There was equivocal nodal tissue in the subcarinal station demonstrating mild hypermetabolism (SUV 3.6). There was more peripheral left lower lobe increased atelectasis with mucoid impaction which is likely secondary to endobronchial obstruction.  There was a small left pleural effusion.  Head MRI on 07/07/2016 revealed no evidence of metastatic disease.  She began radiation on 07/22/2016.  She is s/p week #1 concurrent carboplatin and Taxol (07/24/2016). She requires a reduced dose of Benadryl (25 mg) for her premedication.  She has a history of stage IA left breast cancer in 02/2005.  She underwent lumpectomy.  Pathology revealed a T1cN0 lesion.  Tumor was ER + , PR +, and Her2/neu -.  Two sentinel lymph nodes were negative.  She received chemotherapy (4 cycles of AC and possibly an abbreviated course of Taxol- no records available).  She received radiation.  She completed Femara in 08/2010.  She has fibromyalgia.  She has advanced thoracoabdominal aortic atherosclerosis with fusiform dilatation of the descending thoracic aorta up to 4.3 cm.  She is followed by Dr Lucky Cowboy.  Symptomatically, she feels "sleepy" today.  Exam is stable.  Plan: 1.  Labs today:  CBC with diff, CMP, Mg, ferritin, iron studies, B12, folate, TSH. 2.  Week #2 carboplatin and Taxol (Benadryl premed decreased to 25 mg IV). 3.  Discuss improvement in alkaline phosphatase. 4.  Discuss weight loss and caloric intake. 5.  RTC in 1 week for MD assess, labs (CBC with diff, CMP, Mg), and week #3 carboplatin and Taxol   Lequita Asal, MD  07/31/2016,  10:14 AM

## 2016-08-01 ENCOUNTER — Telehealth: Payer: Self-pay | Admitting: *Deleted

## 2016-08-01 ENCOUNTER — Ambulatory Visit
Admission: RE | Admit: 2016-08-01 | Discharge: 2016-08-01 | Disposition: A | Discharge status: Home / Self Care | Payer: Medicare HMO | Source: Ambulatory Visit | Attending: Radiation Oncology | Admitting: Radiation Oncology

## 2016-08-01 DIAGNOSIS — J449 Chronic obstructive pulmonary disease, unspecified: Secondary | ICD-10-CM | POA: Diagnosis not present

## 2016-08-01 DIAGNOSIS — I509 Heart failure, unspecified: Secondary | ICD-10-CM | POA: Diagnosis not present

## 2016-08-01 DIAGNOSIS — Z510 Encounter for antineoplastic radiation therapy: Secondary | ICD-10-CM | POA: Diagnosis not present

## 2016-08-01 DIAGNOSIS — C3432 Malignant neoplasm of lower lobe, left bronchus or lung: Secondary | ICD-10-CM | POA: Diagnosis not present

## 2016-08-01 DIAGNOSIS — M797 Fibromyalgia: Secondary | ICD-10-CM | POA: Diagnosis not present

## 2016-08-01 DIAGNOSIS — F17210 Nicotine dependence, cigarettes, uncomplicated: Secondary | ICD-10-CM | POA: Diagnosis not present

## 2016-08-01 DIAGNOSIS — R918 Other nonspecific abnormal finding of lung field: Secondary | ICD-10-CM | POA: Diagnosis not present

## 2016-08-01 DIAGNOSIS — G629 Polyneuropathy, unspecified: Secondary | ICD-10-CM | POA: Diagnosis not present

## 2016-08-01 DIAGNOSIS — I110 Hypertensive heart disease with heart failure: Secondary | ICD-10-CM | POA: Diagnosis not present

## 2016-08-01 NOTE — Telephone Encounter (Signed)
Patient came by office today after her radiation.  Her face is red and she is concerned that something is wrong since having her chemo yesterday.  This is most likely due to the steroids she was given prior to her chemo yesterday.  Informed MD that patient came by and what I told her.  She verbalized her agreement.

## 2016-08-02 ENCOUNTER — Encounter: Payer: Self-pay | Admitting: Hematology and Oncology

## 2016-08-04 ENCOUNTER — Ambulatory Visit
Admission: RE | Admit: 2016-08-04 | Discharge: 2016-08-04 | Disposition: A | Discharge status: Home / Self Care | Payer: Medicare HMO | Source: Ambulatory Visit | Attending: Radiation Oncology | Admitting: Radiation Oncology

## 2016-08-04 DIAGNOSIS — R918 Other nonspecific abnormal finding of lung field: Secondary | ICD-10-CM | POA: Diagnosis not present

## 2016-08-04 DIAGNOSIS — M797 Fibromyalgia: Secondary | ICD-10-CM | POA: Diagnosis not present

## 2016-08-04 DIAGNOSIS — I509 Heart failure, unspecified: Secondary | ICD-10-CM | POA: Diagnosis not present

## 2016-08-04 DIAGNOSIS — I110 Hypertensive heart disease with heart failure: Secondary | ICD-10-CM | POA: Diagnosis not present

## 2016-08-04 DIAGNOSIS — J449 Chronic obstructive pulmonary disease, unspecified: Secondary | ICD-10-CM | POA: Diagnosis not present

## 2016-08-04 DIAGNOSIS — F17210 Nicotine dependence, cigarettes, uncomplicated: Secondary | ICD-10-CM | POA: Diagnosis not present

## 2016-08-04 DIAGNOSIS — C3432 Malignant neoplasm of lower lobe, left bronchus or lung: Secondary | ICD-10-CM | POA: Diagnosis not present

## 2016-08-04 DIAGNOSIS — G629 Polyneuropathy, unspecified: Secondary | ICD-10-CM | POA: Diagnosis not present

## 2016-08-04 DIAGNOSIS — Z510 Encounter for antineoplastic radiation therapy: Secondary | ICD-10-CM | POA: Diagnosis not present

## 2016-08-05 ENCOUNTER — Ambulatory Visit: Payer: Medicare HMO | Admitting: Internal Medicine

## 2016-08-05 ENCOUNTER — Ambulatory Visit
Admission: RE | Admit: 2016-08-05 | Discharge: 2016-08-05 | Disposition: A | Discharge status: Home / Self Care | Payer: Medicare HMO | Source: Ambulatory Visit | Attending: Radiation Oncology | Admitting: Radiation Oncology

## 2016-08-05 ENCOUNTER — Institutional Professional Consult (permissible substitution): Payer: Medicare HMO | Admitting: Internal Medicine

## 2016-08-05 DIAGNOSIS — M797 Fibromyalgia: Secondary | ICD-10-CM | POA: Diagnosis not present

## 2016-08-05 DIAGNOSIS — G629 Polyneuropathy, unspecified: Secondary | ICD-10-CM | POA: Diagnosis not present

## 2016-08-05 DIAGNOSIS — Z510 Encounter for antineoplastic radiation therapy: Secondary | ICD-10-CM | POA: Diagnosis not present

## 2016-08-05 DIAGNOSIS — I509 Heart failure, unspecified: Secondary | ICD-10-CM | POA: Diagnosis not present

## 2016-08-05 DIAGNOSIS — M069 Rheumatoid arthritis, unspecified: Secondary | ICD-10-CM | POA: Diagnosis not present

## 2016-08-05 DIAGNOSIS — J449 Chronic obstructive pulmonary disease, unspecified: Secondary | ICD-10-CM | POA: Diagnosis not present

## 2016-08-05 DIAGNOSIS — J45909 Unspecified asthma, uncomplicated: Secondary | ICD-10-CM | POA: Diagnosis not present

## 2016-08-05 DIAGNOSIS — R918 Other nonspecific abnormal finding of lung field: Secondary | ICD-10-CM | POA: Diagnosis not present

## 2016-08-05 DIAGNOSIS — F17210 Nicotine dependence, cigarettes, uncomplicated: Secondary | ICD-10-CM | POA: Diagnosis not present

## 2016-08-05 DIAGNOSIS — D638 Anemia in other chronic diseases classified elsewhere: Secondary | ICD-10-CM | POA: Diagnosis not present

## 2016-08-05 DIAGNOSIS — C3432 Malignant neoplasm of lower lobe, left bronchus or lung: Secondary | ICD-10-CM | POA: Diagnosis not present

## 2016-08-05 DIAGNOSIS — I110 Hypertensive heart disease with heart failure: Secondary | ICD-10-CM | POA: Diagnosis not present

## 2016-08-06 ENCOUNTER — Ambulatory Visit
Admission: RE | Admit: 2016-08-06 | Discharge: 2016-08-06 | Disposition: A | Discharge status: Home / Self Care | Payer: Medicare HMO | Source: Ambulatory Visit | Attending: Radiation Oncology | Admitting: Radiation Oncology

## 2016-08-06 DIAGNOSIS — J449 Chronic obstructive pulmonary disease, unspecified: Secondary | ICD-10-CM | POA: Diagnosis not present

## 2016-08-06 DIAGNOSIS — F17210 Nicotine dependence, cigarettes, uncomplicated: Secondary | ICD-10-CM | POA: Diagnosis not present

## 2016-08-06 DIAGNOSIS — G629 Polyneuropathy, unspecified: Secondary | ICD-10-CM | POA: Diagnosis not present

## 2016-08-06 DIAGNOSIS — Z510 Encounter for antineoplastic radiation therapy: Secondary | ICD-10-CM | POA: Diagnosis not present

## 2016-08-06 DIAGNOSIS — C3432 Malignant neoplasm of lower lobe, left bronchus or lung: Secondary | ICD-10-CM | POA: Diagnosis not present

## 2016-08-06 DIAGNOSIS — I509 Heart failure, unspecified: Secondary | ICD-10-CM | POA: Diagnosis not present

## 2016-08-06 DIAGNOSIS — R918 Other nonspecific abnormal finding of lung field: Secondary | ICD-10-CM | POA: Diagnosis not present

## 2016-08-06 DIAGNOSIS — I110 Hypertensive heart disease with heart failure: Secondary | ICD-10-CM | POA: Diagnosis not present

## 2016-08-06 DIAGNOSIS — M797 Fibromyalgia: Secondary | ICD-10-CM | POA: Diagnosis not present

## 2016-08-07 ENCOUNTER — Ambulatory Visit: Payer: Medicare HMO | Admitting: Hematology and Oncology

## 2016-08-07 ENCOUNTER — Other Ambulatory Visit: Payer: Self-pay | Admitting: Hematology and Oncology

## 2016-08-07 ENCOUNTER — Ambulatory Visit: Payer: Medicare HMO

## 2016-08-07 ENCOUNTER — Encounter: Payer: Self-pay | Admitting: Hematology and Oncology

## 2016-08-07 ENCOUNTER — Ambulatory Visit
Admission: RE | Admit: 2016-08-07 | Discharge: 2016-08-07 | Disposition: A | Discharge status: Home / Self Care | Payer: Medicare HMO | Source: Ambulatory Visit | Attending: Hematology and Oncology | Admitting: Hematology and Oncology

## 2016-08-07 ENCOUNTER — Inpatient Hospital Stay: Payer: Medicare HMO

## 2016-08-07 ENCOUNTER — Inpatient Hospital Stay (HOSPITAL_BASED_OUTPATIENT_CLINIC_OR_DEPARTMENT_OTHER): Payer: Medicare HMO | Admitting: Hematology and Oncology

## 2016-08-07 VITALS — BP 115/74 | HR 99 | Temp 97.8°F | Resp 18 | Wt 115.5 lb

## 2016-08-07 DIAGNOSIS — R079 Chest pain, unspecified: Secondary | ICD-10-CM | POA: Diagnosis not present

## 2016-08-07 DIAGNOSIS — M797 Fibromyalgia: Secondary | ICD-10-CM

## 2016-08-07 DIAGNOSIS — J449 Chronic obstructive pulmonary disease, unspecified: Secondary | ICD-10-CM

## 2016-08-07 DIAGNOSIS — R5381 Other malaise: Secondary | ICD-10-CM

## 2016-08-07 DIAGNOSIS — I700 Atherosclerosis of aorta: Secondary | ICD-10-CM

## 2016-08-07 DIAGNOSIS — Z170 Estrogen receptor positive status [ER+]: Secondary | ICD-10-CM

## 2016-08-07 DIAGNOSIS — C3432 Malignant neoplasm of lower lobe, left bronchus or lung: Secondary | ICD-10-CM

## 2016-08-07 DIAGNOSIS — Z923 Personal history of irradiation: Secondary | ICD-10-CM

## 2016-08-07 DIAGNOSIS — J9383 Other pneumothorax: Secondary | ICD-10-CM | POA: Insufficient documentation

## 2016-08-07 DIAGNOSIS — R748 Abnormal levels of other serum enzymes: Secondary | ICD-10-CM

## 2016-08-07 DIAGNOSIS — R5383 Other fatigue: Secondary | ICD-10-CM

## 2016-08-07 DIAGNOSIS — F17210 Nicotine dependence, cigarettes, uncomplicated: Secondary | ICD-10-CM

## 2016-08-07 DIAGNOSIS — D649 Anemia, unspecified: Secondary | ICD-10-CM

## 2016-08-07 DIAGNOSIS — Z9981 Dependence on supplemental oxygen: Secondary | ICD-10-CM

## 2016-08-07 DIAGNOSIS — J9811 Atelectasis: Secondary | ICD-10-CM | POA: Diagnosis not present

## 2016-08-07 DIAGNOSIS — Z880 Allergy status to penicillin: Secondary | ICD-10-CM

## 2016-08-07 DIAGNOSIS — I491 Atrial premature depolarization: Secondary | ICD-10-CM | POA: Insufficient documentation

## 2016-08-07 DIAGNOSIS — Z5111 Encounter for antineoplastic chemotherapy: Secondary | ICD-10-CM

## 2016-08-07 DIAGNOSIS — J90 Pleural effusion, not elsewhere classified: Secondary | ICD-10-CM | POA: Insufficient documentation

## 2016-08-07 DIAGNOSIS — Z853 Personal history of malignant neoplasm of breast: Secondary | ICD-10-CM | POA: Diagnosis not present

## 2016-08-07 DIAGNOSIS — Z79899 Other long term (current) drug therapy: Secondary | ICD-10-CM

## 2016-08-07 DIAGNOSIS — R634 Abnormal weight loss: Secondary | ICD-10-CM | POA: Diagnosis not present

## 2016-08-07 DIAGNOSIS — I110 Hypertensive heart disease with heart failure: Secondary | ICD-10-CM

## 2016-08-07 DIAGNOSIS — Z9223 Personal history of estrogen therapy: Secondary | ICD-10-CM | POA: Diagnosis not present

## 2016-08-07 DIAGNOSIS — Z881 Allergy status to other antibiotic agents status: Secondary | ICD-10-CM

## 2016-08-07 DIAGNOSIS — M810 Age-related osteoporosis without current pathological fracture: Secondary | ICD-10-CM

## 2016-08-07 DIAGNOSIS — M069 Rheumatoid arthritis, unspecified: Secondary | ICD-10-CM

## 2016-08-07 DIAGNOSIS — I509 Heart failure, unspecified: Secondary | ICD-10-CM

## 2016-08-07 DIAGNOSIS — Z87440 Personal history of urinary (tract) infections: Secondary | ICD-10-CM

## 2016-08-07 DIAGNOSIS — R042 Hemoptysis: Secondary | ICD-10-CM

## 2016-08-07 DIAGNOSIS — Z7952 Long term (current) use of systemic steroids: Secondary | ICD-10-CM

## 2016-08-07 DIAGNOSIS — Z9221 Personal history of antineoplastic chemotherapy: Secondary | ICD-10-CM

## 2016-08-07 LAB — COMPREHENSIVE METABOLIC PANEL
ALT: 10 U/L — ABNORMAL LOW (ref 14–54)
AST: 16 U/L (ref 15–41)
Albumin: 3 g/dL — ABNORMAL LOW (ref 3.5–5.0)
Alkaline Phosphatase: 132 U/L — ABNORMAL HIGH (ref 38–126)
Anion gap: 6 (ref 5–15)
BUN: 14 mg/dL (ref 6–20)
CO2: 27 mmol/L (ref 22–32)
Calcium: 8.6 mg/dL — ABNORMAL LOW (ref 8.9–10.3)
Chloride: 96 mmol/L — ABNORMAL LOW (ref 101–111)
Creatinine, Ser: 1.08 mg/dL — ABNORMAL HIGH (ref 0.44–1.00)
GFR calc Af Amer: 58 mL/min — ABNORMAL LOW (ref >60)
GFR calc non Af Amer: 50 mL/min — ABNORMAL LOW (ref >60)
Glucose, Bld: 111 mg/dL — ABNORMAL HIGH (ref 65–99)
Potassium: 4.5 mmol/L (ref 3.5–5.1)
Sodium: 129 mmol/L — ABNORMAL LOW (ref 135–145)
Total Bilirubin: 0.5 mg/dL (ref 0.3–1.2)
Total Protein: 6.6 g/dL (ref 6.5–8.1)

## 2016-08-07 LAB — CBC WITH DIFFERENTIAL/PLATELET
Basophils Absolute: 0 10*3/uL (ref 0–0.1)
Basophils Relative: 1 %
Eosinophils Absolute: 0.1 10*3/uL (ref 0–0.7)
Eosinophils Relative: 1 %
HCT: 29.8 % — ABNORMAL LOW (ref 35.0–47.0)
Hemoglobin: 10.2 g/dL — ABNORMAL LOW (ref 12.0–16.0)
Lymphocytes Relative: 12 %
Lymphs Abs: 0.6 10*3/uL — ABNORMAL LOW (ref 1.0–3.6)
MCH: 30 pg (ref 26.0–34.0)
MCHC: 34.4 g/dL (ref 32.0–36.0)
MCV: 87.1 fL (ref 80.0–100.0)
Monocytes Absolute: 0.6 10*3/uL (ref 0.2–0.9)
Monocytes Relative: 11 %
Neutro Abs: 3.8 10*3/uL (ref 1.4–6.5)
Neutrophils Relative %: 75 %
Platelets: 253 10*3/uL (ref 150–440)
RBC: 3.42 MIL/uL — ABNORMAL LOW (ref 3.80–5.20)
RDW: 16 % — ABNORMAL HIGH (ref 11.5–14.5)
WBC: 5.1 10*3/uL (ref 3.6–11.0)

## 2016-08-07 LAB — TROPONIN I: Troponin I: <0.03 ng/mL (ref <0.03)

## 2016-08-07 LAB — MAGNESIUM: Magnesium: 1.7 mg/dL (ref 1.7–2.4)

## 2016-08-07 NOTE — Progress Notes (Signed)
[facility] day:  08/07/2016   Chief Complaint: [name] [name] is a 76 y.o. 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  HPI: The patient was last seen in the medical oncology clinic on 07/31/2016.  At that time, she felt "sleepy".  She had lost some weight.  Exam was stable. She received week #2 chemotherapy.  Benadryl premed was decreased to 25 mg.  She did not sleep through first treatment (wishes she did).  From last visit included a hematocrit 33.9, hemoglobin 11.6 and MCV 86.9. B12 was 345. Folate was 19.7. Ferritin was 103. Iron saturation was 8% with a TIBC of 281.   During the interim, she has felt "like hell". She notes that the middle of her chest "just hurts". She denies any pleuritic pain. She denies any shortness of breath. She denies any hemoptysis. Sputum is slightly pink. Her throat is irritated with radiation.  Her joints ache.   Past Medical History:  Diagnosis Date  . Asthma   . Breast cancer ([place]) 2009   left  . Cancer ([place])   . CHF (congestive heart failure) ([place])   . CHF (congestive heart failure) ([place])   . Chronic UTI   . COPD (chronic obstructive pulmonary disease) ([place])   . Dizziness   . Fibromyalgia   . Hypertension   . Neuropathy   . Personal history of tobacco use, presenting hazards to health 05/17/2015  . Polyp, larynx   . RA (rheumatoid arthritis) ([place])   . Sinus infection    recent  . Stumbling gait    to the left  . Supplemental oxygen dependent    2.5l    Past Surgical History:  Procedure Laterality Date  . ABDOMINAL HYSTERECTOMY    . BREAST LUMPECTOMY Left 2009   chemo and radiation  . CYST EXCISION Left 02/27/2015   Procedure: CYST REMOVAL;  Surgeon: Hessie Knows, MD;  Location: [facility] ORS;  Service: Orthopedics;  Laterality: Left;  . EYE MUSCLE SURGERY Right    13  surgeries  . FLEXIBLE BRONCHOSCOPY N/A 07/01/2016   Procedure: FLEXIBLE BRONCHOSCOPY;  Surgeon: Wilhelmina Mcardle, MD;  Location: [facility] ORS;  Service: Pulmonary;  Laterality: N/A;  . THUMB ARTHROSCOPY Left     Family History  Problem Relation Age of Onset  . Diabetes Father   . Stroke Father   . Heart attack Father   . CAD Sister     Social History:  reports that she has been smoking Cigarettes.  She has a 40.00 pack-year smoking history. She has never used smokeless tobacco. She reports that she does not drink alcohol or use drugs.  He lives with her son and grandson. The patient is alone today.   Allergies:  Allergies  Allergen Reactions  . Contrast Media [Iodinated Diagnostic Agents] Other (See Comments)    Pt was sent to the ED following contrast media injection at [place]. Unknown reason. She has been premedicated since without complications. Pt to be premedicated prior to contrast media injections  . Ioxaglate Other (See Comments)    Pt was sent to the ED following contrast media injection at [place]. Unknown reason. She has been premedicated since without complications. Pt to be premedicated prior to contrast media injections  . Sulfa Antibiotics     Other reaction(s): Other (See Comments)  . Tetracycline Hives and  Other (See Comments)  . White Petrolatum Other (See Comments)  . Amoxicillin-Pot Clavulanate Rash    Other reaction(s): Unknown Other reaction(s): Unknown _0  Other reaction(s): Unknown Blisters in mouth Blisters in mouth  . Tape Rash    Current Medications: Current Outpatient Prescriptions  Medication Sig Dispense Refill  . Calcium-Vitamin D 600-200 MG-UNIT tablet Take 1 tablet by mouth daily.     . carvedilol (COREG) 12.5 MG tablet Take 1 tablet (12.5 mg total) by mouth 2 (two) times daily with a meal. 60 tablet 0  . cholecalciferol (VITAMIN D)  1000 units tablet Take 1,000 Units by mouth daily.    . clonazePAM (KLONOPIN) 1 MG tablet Take 1 mg by mouth 2 (two) times daily as needed for anxiety.     . gabapentin (NEURONTIN) 600 MG tablet Take 600 mg by mouth 3 (three) times daily.     [name] HYDROcodone-acetaminophen (NORCO/VICODIN) 5-325 MG tablet Take 1-2 tablets by mouth every 4 (four) hours as needed for moderate pain.     [name] imipramine (TOFRANIL) 25 MG tablet Take 1 tablet by mouth at bedtime.     . iron polysaccharides (FERREX 150) 150 MG capsule Take 150 mg by mouth daily.    [name] LORazepam (ATIVAN) 0.5 MG tablet Take 1 tablet (0.5 mg total) by mouth every 6 (six) hours as needed (Nausea or vomiting). 30 tablet 0  . Multiple Vitamin (MULTIVITAMIN) capsule Take 1 capsule by mouth daily.    . nitrofurantoin, macrocrystal-monohydrate, (MACROBID) 100 MG capsule Take 100 mg by mouth 2 (two) times daily.    . ondansetron (ZOFRAN) 8 MG tablet Take 1 tablet (8 mg total) by mouth 2 (two) times daily as needed for refractory nausea / vomiting. Start on day 3 after chemo. 30 tablet 1  . predniSONE (DELTASONE) 20 MG tablet Take 2 tablets (40 mg total) by mouth daily with breakfast. 10 days 20 tablet 0  . sucralfate (CARAFATE) 1 g tablet Take 1 tablet (1 g total) by mouth 3 (three) times daily. 90 tablet 6  . Tiotropium Bromide Monohydrate (SPIRIVA RESPIMAT) 2.5 MCG/ACT AERS Inhale 2.5 mcg into the lungs 2 (two) times daily. (Patient taking differently: Inhale 2.5 mcg into the lungs daily. ) 1 Inhaler 0  . traMADol (ULTRAM) 50 MG tablet Take 50 mg by mouth daily.     . vitamin C (ASCORBIC ACID) 500 MG tablet Take 500 mg by mouth daily.    [name] ZINC SULFATE PO Take 1 tablet by mouth daily.     [name] zolpidem (AMBIEN) 5 MG tablet Take 5 mg by mouth at bedtime.     No current facility-administered medications for this visit.     Review of Systems:  GENERAL:  Feels "like hell".  No fevers or sweats.  Weight up 3 pounds. PERFORMANCE STATUS (ECOG):  2 HEENT:  No  visual changes, runny nose, sore throat, mouth sores or tenderness. Lungs: Shortness of breath (stable).  Cough.  Slightly pink sputum.  No hemoptysis.  On oxygen 2 liter/min visa [place]. Cardiac:  Mid sternal chest pain (see HPI) without radiation to neck or arm.  No palpitations, orthopnea, or PND. GI:  No nausea, vomiting, diarrhea, constipation, melena or hematochezia. GU:  No urgency, frequency, dysuria, or hematuria. Recent UTI. Musculoskeletal:  Fibromyalgia.  Osteoporosis.  No back pain.  No joint pain.  No muscle tenderness. Extremities:  No pain or swelling. Skin:  Decubitus.  No  rashes or skin changes. Neuro:  No headache, numbness or weakness, balance or coordination issues. Endocrine:  No diabetes, thyroid issues, hot flashes or night sweats. Psych:  No mood changes, depression or anxiety. Pain:  No focal pain. Review of systems:  All other systems reviewed and found to be negative.  Physical Exam: Blood pressure 115/74, pulse 99, temperature 97.8 F (36.6 C), temperature source Tympanic, resp. rate 18, weight 115 lb 8 oz (52.4 kg). O2 sats 95%. GENERAL:  Thin woman sitting comfortably in a wheelchair in the exam room in no acute distress. MENTAL STATUS:  Alert and oriented to person, place and time. HEAD:  Short blonde hair.  Normocephalic, atraumatic, face symmetric, no Cushingoid features. EYES:  Blue eyes.  No conjunctivitis or scleral icterus. ENT:  Oropharynx clear without lesion.  Tongue normal. Mucous membranes moist.  RESPIRATORY:  Clear to auscultation.  No rales, wheezes or rhonchi. CARDIOVASCULAR:  Regular rate and rhythm without murmur, rub or gallop. CHEST WALL:  No pain on palpation.  Discomfort mid chest and epigastric region. SKIN:  No rashes, ulcers or lesions. EXTREMITIES: Arthritic changes in hands.  Tender to palpation.  No edema, no skin discoloration or tenderness.  No palpable cords. LYMPH NODES: No palpable cervical, supraclavicular, axillary or inguinal  adenopathy  NEUROLOGICAL: Alert and interactive.  Unremarkable. PSYCH:  Appropriate.   Appointment on 08/07/2016  Component Date Value Ref Range Status  . WBC 08/07/2016 5.1  3.6 - 11.0 K/uL Final  . RBC 08/07/2016 3.42* 3.80 - 5.20 MIL/uL Final  . Hemoglobin 08/07/2016 10.2* 12.0 - 16.0 g/dL Final  . HCT 08/07/2016 29.8* 35.0 - 47.0 % Final  . MCV 08/07/2016 87.1  80.0 - 100.0 fL Final  . MCH 08/07/2016 30.0  26.0 - 34.0 pg Final  . MCHC 08/07/2016 34.4  32.0 - 36.0 g/dL Final  . RDW 08/07/2016 16.0* 11.5 - 14.5 % Final  . Platelets 08/07/2016 253  150 - 440 K/uL Final  . Neutrophils Relative % 08/07/2016 75  % Final  . Neutro Abs 08/07/2016 3.8  1.4 - 6.5 K/uL Final  . Lymphocytes Relative 08/07/2016 12  % Final  . Lymphs Abs 08/07/2016 0.6* 1.0 - 3.6 K/uL Final  . Monocytes Relative 08/07/2016 11  % Final  . Monocytes Absolute 08/07/2016 0.6  0.2 - 0.9 K/uL Final  . Eosinophils Relative 08/07/2016 1  % Final  . Eosinophils Absolute 08/07/2016 0.1  0 - 0.7 K/uL Final  . Basophils Relative 08/07/2016 1  % Final  . Basophils Absolute 08/07/2016 0.0  0 - 0.1 K/uL Final  . Sodium 08/07/2016 129* 135 - 145 mmol/L Final  . Potassium 08/07/2016 4.5  3.5 - 5.1 mmol/L Final  . Chloride 08/07/2016 96* 101 - 111 mmol/L Final  . CO2 08/07/2016 27  22 - 32 mmol/L Final  . Glucose, Bld 08/07/2016 111* 65 - 99 mg/dL Final  . BUN 08/07/2016 14  6 - 20 mg/dL Final  . Creatinine, Ser 08/07/2016 1.08* 0.44 - 1.00 mg/dL Final  . Calcium 08/07/2016 8.6* 8.9 - 10.3 mg/dL Final  . Total Protein 08/07/2016 6.6  6.5 - 8.1 g/dL Final  . Albumin 08/07/2016 3.0* 3.5 - 5.0 g/dL Final  . AST 08/07/2016 16  15 - 41 U/L Final  . ALT 08/07/2016 10* 14 - 54 U/L Final  . Alkaline Phosphatase 08/07/2016 132* 38 - 126 U/L Final  . Total Bilirubin 08/07/2016 0.5  0.3 - 1.2 mg/dL Final  . GFR calc non Af Wyvonnia Lora  08/07/2016 50* >60 mL/min Final  . GFR calc Af Amer 08/07/2016 58* >60 mL/min Final   Comment:  (NOTE) The eGFR has been calculated using the CKD EPI equation. This calculation has not been validated in all clinical situations. eGFR's persistently <60 mL/min signify possible Chronic Kidney Disease.   . Anion gap 08/07/2016 6  5 - 15 Final  . Magnesium 08/07/2016 1.7  1.7 - 2.4 mg/dL Final    Assessment:  [name] [name] is a 76 y.o. female with a clinical T2bNxM0 sqamous cell lung cancer of the left lung s/p bronchoscopy and biopsy on 07/01/2016.  She has a 40 pack year smoking history.  She presented with left lower chest wall pain.  Chest CT with contrast on 06/11/2016 revealed a 3.2 x 3.0 mass like area of focal opacity in the medial left lower lobe with obliteration of segmental airways to the anterior left lower lobe.  PET scan on 06/27/2016 revealed a 4.3 x 3.2 cm central left lower lobe lung lesion (SUV 14.3) consistent with primary bronchogenic carcinoma.  There was equivocal nodal tissue in the subcarinal station demonstrating mild hypermetabolism (SUV 3.6). There was more peripheral left lower lobe increased atelectasis with mucoid impaction which is likely secondary to endobronchial obstruction.  There was a small left pleural effusion.  Head MRI on 07/07/2016 revealed no evidence of metastatic disease.  She began radiation on 07/22/2016.  She is s/p week #2 concurrent carboplatin and Taxol (07/24/2016 - 07/31/2016). She requires a reduced dose of Benadryl (25 mg) for her premedication.  She has a normocytic anemia likely due to chemotherapy.  Work-up on 07/24/2016 revealed the following normal labs: ferritin (103), B12 (345), folate(19.7).  Iron saturation was 8% and TIBC was 258.  She has a history of stage IA left breast cancer in 02/2005.  She underwent lumpectomy.  Pathology revealed a T1cN0 lesion.  Tumor was ER + , PR +, and Her2/neu -.  Two sentinel lymph nodes were negative.  She received chemotherapy (4 cycles of AC and possibly an abbreviated course of Taxol- no  records available).  She received radiation.  She completed Femara in 08/2010.  She has fibromyalgia.  She has advanced thoracoabdominal aortic atherosclerosis with fusiform dilatation of the descending thoracic aorta up to 4.3 cm.  She is followed by Dr Lucky Cowboy.  Symptomatically, she has substernal and epigastric discomfort likely due to radiation.  Exam is stable. O2 sats are 95%.  Plan: 1.  Labs today:  CBC with diff, CMP, Mg. 2.  EKG. 3.  Troponin STAT. 4.  CXR (PA and lateral). 5.  Postpone week #3 carboplatin and Taxol until next week.  Addendum:  EKG revealed no change.  Troponin was <0.03.  CXR revealed stable left basilar atelectasis or infiltrate. Interval resolution of the previously seen left base pneumothorax with small left pleural effusion.  Patient cleared for chemotherapy, but patient declined as she was tired.  Patient rescheduled for 08/11/2016.   Lequita Asal, MD  08/07/2016, 10:08 AM

## 2016-08-07 NOTE — Progress Notes (Signed)
Patient complains of joint pain.  Otherwise, no complaints today.    Patient c/o chest pain after MD came into exam room. When lying back patient was coughing, diaphoretic and c/o chest pain.  EKG ordered and reviewed by MD.

## 2016-08-08 ENCOUNTER — Telehealth: Payer: Self-pay | Admitting: *Deleted

## 2016-08-08 ENCOUNTER — Ambulatory Visit
Admission: RE | Admit: 2016-08-08 | Discharge: 2016-08-08 | Disposition: A | Discharge status: Home / Self Care | Payer: Medicare HMO | Source: Ambulatory Visit | Attending: Hematology and Oncology | Admitting: Hematology and Oncology

## 2016-08-08 ENCOUNTER — Ambulatory Visit
Admission: RE | Admit: 2016-08-08 | Discharge: 2016-08-08 | Disposition: A | Discharge status: Home / Self Care | Payer: Medicare HMO | Source: Ambulatory Visit | Attending: Radiation Oncology | Admitting: Radiation Oncology

## 2016-08-08 ENCOUNTER — Other Ambulatory Visit: Payer: Self-pay | Admitting: *Deleted

## 2016-08-08 DIAGNOSIS — C50912 Malignant neoplasm of unspecified site of left female breast: Secondary | ICD-10-CM

## 2016-08-08 DIAGNOSIS — R918 Other nonspecific abnormal finding of lung field: Secondary | ICD-10-CM | POA: Diagnosis not present

## 2016-08-08 DIAGNOSIS — I509 Heart failure, unspecified: Secondary | ICD-10-CM | POA: Diagnosis not present

## 2016-08-08 DIAGNOSIS — G629 Polyneuropathy, unspecified: Secondary | ICD-10-CM | POA: Diagnosis not present

## 2016-08-08 DIAGNOSIS — Z510 Encounter for antineoplastic radiation therapy: Secondary | ICD-10-CM | POA: Diagnosis not present

## 2016-08-08 DIAGNOSIS — J449 Chronic obstructive pulmonary disease, unspecified: Secondary | ICD-10-CM | POA: Diagnosis not present

## 2016-08-08 DIAGNOSIS — F17210 Nicotine dependence, cigarettes, uncomplicated: Secondary | ICD-10-CM | POA: Diagnosis not present

## 2016-08-08 DIAGNOSIS — C3432 Malignant neoplasm of lower lobe, left bronchus or lung: Secondary | ICD-10-CM | POA: Diagnosis not present

## 2016-08-08 DIAGNOSIS — Z170 Estrogen receptor positive status [ER+]: Principal | ICD-10-CM

## 2016-08-08 DIAGNOSIS — M797 Fibromyalgia: Secondary | ICD-10-CM | POA: Diagnosis not present

## 2016-08-08 DIAGNOSIS — I110 Hypertensive heart disease with heart failure: Secondary | ICD-10-CM | POA: Diagnosis not present

## 2016-08-08 MED ORDER — BENZONATATE 200 MG PO CAPS: 200.0000 mg | ORAL_CAPSULE | Freq: Three times a day (TID) | ORAL | 0 refills | Status: DC | PRN

## 2016-08-08 NOTE — Telephone Encounter (Signed)
Patient called and asked for cough medication, discussed with Dr. Mike Gip and she ordered tessalon pearles, patient instructed on use, voiced understanding.

## 2016-08-10 NOTE — Progress Notes (Signed)
[facility] day:  08/11/2016   Chief Complaint: [name] [name] is a 76 y.o. 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  HPI: The patient was last seen in the medical oncology clinic on 08/07/2016.  At that time,  She felt "like hell".  She had substernal and epigastric discomfort likely due to radiation.  Exam was stable. O2 sats were 95%.  EKG revealed no change.  Troponin was <0.03.  CXR revealed stable left basilar atelectasis or infiltrate and interval resolution of the previously seen left base pneumothorax with small left pleural effusion.  She was cleared for chemotherapy, but patient declined as she was tired.    During the interim, she notes resolution of chest pain.  She continues to have a cough despite [employer]  She denies any fever   Past Medical History:  Diagnosis Date  . Asthma   . Breast cancer ([place]) 2009   left  . Cancer ([place])   . CHF (congestive heart failure) ([place])   . CHF (congestive heart failure) ([place])   . Chronic UTI   . COPD (chronic obstructive pulmonary disease) ([place])   . Dizziness   . Fibromyalgia   . Hypertension   . Neuropathy   . Personal history of tobacco use, presenting hazards to health 05/17/2015  . Polyp, larynx   . RA (rheumatoid arthritis) ([place])   . Sinus infection    recent  . Stumbling gait    to the left  . Supplemental oxygen dependent    2.5l    Past Surgical History:  Procedure Laterality Date  . ABDOMINAL HYSTERECTOMY    . BREAST LUMPECTOMY Left 2009   chemo and radiation  . CYST EXCISION Left 02/27/2015   Procedure: CYST REMOVAL;  Surgeon: Hessie Knows, MD;  Location: [facility] ORS;  Service: Orthopedics;  Laterality: Left;  . EYE MUSCLE SURGERY Right    13 surgeries  . FLEXIBLE BRONCHOSCOPY N/A 07/01/2016   Procedure: FLEXIBLE BRONCHOSCOPY;  Surgeon:  Wilhelmina Mcardle, MD;  Location: [facility] ORS;  Service: Pulmonary;  Laterality: N/A;  . THUMB ARTHROSCOPY Left     Family History  Problem Relation Age of Onset  . Diabetes Father   . Stroke Father   . Heart attack Father   . CAD Sister     Social History:  reports that she has been smoking Cigarettes.  She has a 40.00 pack-year smoking history. She has never used smokeless tobacco. She reports that she does not drink alcohol or use drugs.  He lives with her son and grandson. The patient is alone today.   Allergies:  Allergies  Allergen Reactions  . Contrast Media [Iodinated Diagnostic Agents] Other (See Comments)    Pt was sent to the ED following contrast media injection at [place]. Unknown reason. She has been premedicated since without complications. Pt to be premedicated prior to contrast media injections  . Ioxaglate Other (See Comments)    Pt was sent to the ED following contrast media injection at [place]. Unknown reason. She has been premedicated since without complications. Pt to be premedicated prior to contrast media injections  . Sulfa Antibiotics     Other reaction(s): Other (See Comments)  . Tetracycline Hives and Other (See Comments)  . White Petrolatum Other (See Comments)  . Amoxicillin-Pot Clavulanate Rash    Other reaction(s):  Unknown Other reaction(s): Unknown _0  Other reaction(s): Unknown Blisters in mouth Blisters in mouth  . Tape Rash    Current Medications: Current Outpatient Prescriptions  Medication Sig Dispense Refill  . benzonatate (TESSALON) 200 MG capsule Take 1 capsule (200 mg total) by mouth 3 (three) times daily as needed for cough. 20 capsule 0  . Calcium-Vitamin D 600-200 MG-UNIT tablet Take 1 tablet by mouth daily.     . carvedilol (COREG) 12.5 MG tablet Take 1 tablet (12.5 mg total) by mouth 2 (two) times daily with a meal. 60  tablet 0  . cholecalciferol (VITAMIN D) 1000 units tablet Take 1,000 Units by mouth daily.    . clonazePAM (KLONOPIN) 1 MG tablet Take 1 mg by mouth 2 (two) times daily as needed for anxiety.     . gabapentin (NEURONTIN) 600 MG tablet Take 600 mg by mouth 3 (three) times daily.     [name] HYDROcodone-acetaminophen (NORCO/VICODIN) 5-325 MG tablet Take 1-2 tablets by mouth every 4 (four) hours as needed for moderate pain.     [name] imipramine (TOFRANIL) 25 MG tablet Take 1 tablet by mouth at bedtime.     . iron polysaccharides (FERREX 150) 150 MG capsule Take 150 mg by mouth daily.    [name] LORazepam (ATIVAN) 0.5 MG tablet Take 1 tablet (0.5 mg total) by mouth every 6 (six) hours as needed (Nausea or vomiting). 30 tablet 0  . Multiple Vitamin (MULTIVITAMIN) capsule Take 1 capsule by mouth daily.    . nitrofurantoin, macrocrystal-monohydrate, (MACROBID) 100 MG capsule Take 100 mg by mouth 2 (two) times daily.    . ondansetron (ZOFRAN) 8 MG tablet Take 1 tablet (8 mg total) by mouth 2 (two) times daily as needed for refractory nausea / vomiting. Start on day 3 after chemo. 30 tablet 1  . predniSONE (DELTASONE) 20 MG tablet Take 2 tablets (40 mg total) by mouth daily with breakfast. 10 days 20 tablet 0  . sucralfate (CARAFATE) 1 g tablet Take 1 tablet (1 g total) by mouth 3 (three) times daily. 90 tablet 6  . Tiotropium Bromide Monohydrate (SPIRIVA RESPIMAT) 2.5 MCG/ACT AERS Inhale 2.5 mcg into the lungs 2 (two) times daily. (Patient taking differently: Inhale 2.5 mcg into the lungs daily. ) 1 Inhaler 0  . traMADol (ULTRAM) 50 MG tablet Take 50 mg by mouth daily.     . vitamin C (ASCORBIC ACID) 500 MG tablet Take 500 mg by mouth daily.    [name] ZINC SULFATE PO Take 1 tablet by mouth daily.     [name] zolpidem (AMBIEN) 5 MG tablet Take 5 mg by mouth at bedtime.     No current facility-administered medications for this visit.     Review of Systems:  GENERAL:  "Don't feel like smiling".  No fevers or sweats.  Weight down 1  pound. PERFORMANCE STATUS (ECOG):  2 HEENT:  No visual changes, runny nose, sore throat, mouth sores or tenderness. Lungs: Shortness of breath (stable).  Cough.  No hemoptysis.  On oxygen 2 liter/min visa [place]. Cardiac:  Mid sternal chest pain, resolved.  No palpitations, orthopnea, or PND. GI:  No nausea, vomiting, diarrhea, constipation, melena or hematochezia. GU:  No urgency, frequency, dysuria, or hematuria. Recent UTI. Musculoskeletal:  Fibromyalgia.  Osteoporosis.  No back pain.  No joint pain.  No muscle tenderness. Extremities:  No pain or swelling. Skin:  Decubitus.  No rashes or skin changes.  Neuro:  No headache, numbness or weakness, balance or coordination issues. Endocrine:  No diabetes, thyroid issues, hot flashes or night sweats. Psych:  No mood changes, depression or anxiety. Pain:  No focal pain. Review of systems:  All other systems reviewed and found to be negative.  Physical Exam: Blood pressure 133/79, pulse 94, temperature (!) 97.2 F (36.2 C), temperature source Tympanic, resp. rate 18, weight 114 lb 6 oz (51.9 kg). O2 sats 95%. GENERAL:  Thin woman sitting comfortably in a wheelchair in the exam room in no acute distress. MENTAL STATUS:  Alert and oriented to person, place and time. HEAD:  Short blonde hair.  Normocephalic, atraumatic, face symmetric, no Cushingoid features. EYES:  Blue eyes.  No conjunctivitis or scleral icterus. ENT:  Oropharynx clear without lesion.  Tongue normal. Mucous membranes moist.  RESPIRATORY:  Clear to auscultation.  No rales, wheezes or rhonchi. CARDIOVASCULAR:  Regular rate and rhythm without murmur, rub or gallop. SKIN:  No rashes, ulcers or lesions. EXTREMITIES: Arthritic changes in hands.  Tender to palpation.  No edema, no skin discoloration or tenderness.  No palpable cords. LYMPH NODES: No palpable cervical, supraclavicular, axillary or inguinal adenopathy  NEUROLOGICAL: Alert and interactive.  Unremarkable. PSYCH:   Appropriate.   Orders Only on 08/11/2016  Component Date Value Ref Range Status  . WBC 08/11/2016 5.3  3.6 - 11.0 K/uL Final  . RBC 08/11/2016 3.42* 3.80 - 5.20 MIL/uL Final  . Hemoglobin 08/11/2016 10.3* 12.0 - 16.0 g/dL Final  . HCT 08/11/2016 29.9* 35.0 - 47.0 % Final  . MCV 08/11/2016 87.4  80.0 - 100.0 fL Final  . MCH 08/11/2016 30.1  26.0 - 34.0 pg Final  . MCHC 08/11/2016 34.5  32.0 - 36.0 g/dL Final  . RDW 08/11/2016 16.3* 11.5 - 14.5 % Final  . Platelets 08/11/2016 294  150 - 440 K/uL Final  . Neutrophils Relative % 08/11/2016 72  % Final  . Neutro Abs 08/11/2016 3.8  1.4 - 6.5 K/uL Final  . Lymphocytes Relative 08/11/2016 13  % Final  . Lymphs Abs 08/11/2016 0.7* 1.0 - 3.6 K/uL Final  . Monocytes Relative 08/11/2016 11  % Final  . Monocytes Absolute 08/11/2016 0.6  0.2 - 0.9 K/uL Final  . Eosinophils Relative 08/11/2016 3  % Final  . Eosinophils Absolute 08/11/2016 0.1  0 - 0.7 K/uL Final  . Basophils Relative 08/11/2016 1  % Final  . Basophils Absolute 08/11/2016 0.0  0 - 0.1 K/uL Final  . Sodium 08/11/2016 133* 135 - 145 mmol/L Final  . Potassium 08/11/2016 3.9  3.5 - 5.1 mmol/L Final  . Chloride 08/11/2016 97* 101 - 111 mmol/L Final  . CO2 08/11/2016 27  22 - 32 mmol/L Final  . Glucose, Bld 08/11/2016 135* 65 - 99 mg/dL Final  . BUN 08/11/2016 15  6 - 20 mg/dL Final  . Creatinine, Ser 08/11/2016 1.23* 0.44 - 1.00 mg/dL Final  . Calcium 08/11/2016 9.0  8.9 - 10.3 mg/dL Final  . Total Protein 08/11/2016 6.6  6.5 - 8.1 g/dL Final  . Albumin 08/11/2016 2.9* 3.5 - 5.0 g/dL Final  . AST 08/11/2016 18  15 - 41 U/L Final  . ALT 08/11/2016 9* 14 - 54 U/L Final  . Alkaline Phosphatase 08/11/2016 133* 38 - 126 U/L Final  . Total Bilirubin 08/11/2016 0.3  0.3 - 1.2 mg/dL Final  . GFR calc non Af Amer 08/11/2016 42* >60 mL/min Final  . GFR calc Af Amer 08/11/2016 49* >60 mL/min  Final   Comment: (NOTE) The eGFR has been calculated using the CKD EPI equation. This calculation  has not been validated in all clinical situations. eGFR's persistently <60 mL/min signify possible Chronic Kidney Disease.   . Anion gap 08/11/2016 9  5 - 15 Final  . Magnesium 08/11/2016 1.9  1.7 - 2.4 mg/dL Final    Assessment:  [name] [name] is a 76 y.o. female with a clinical [id number] sqamous cell lung cancer of the left lung s/p bronchoscopy and biopsy on 07/01/2016.  She has a 40 pack year smoking history.  She presented with left lower chest wall pain.  Chest CT with contrast on 06/11/2016 revealed a 3.2 x 3.0 mass like area of focal opacity in the medial left lower lobe with obliteration of segmental airways to the anterior left lower lobe.  PET scan on 06/27/2016 revealed a 4.3 x 3.2 cm central left lower lobe lung lesion (SUV 14.3) consistent with primary bronchogenic carcinoma.  There was equivocal nodal tissue in the subcarinal station demonstrating mild hypermetabolism (SUV 3.6). There was more peripheral left lower lobe increased atelectasis with mucoid impaction which is likely secondary to endobronchial obstruction.  There was a small left pleural effusion.  Head MRI on 07/07/2016 revealed no evidence of metastatic disease.  She began radiation on 07/22/2016.  She is s/p week #2 concurrent carboplatin and Taxol (07/24/2016 - 07/31/2016). She requires a reduced dose of Benadryl (25 mg) for her premedication.  Week #3 was postponed secondary to chest pain and evaluation.  She has a normocytic anemia likely due to chemotherapy.  Work-up on 07/24/2016 revealed the following normal labs: ferritin (103), B12 (345), folate(19.7).  Iron saturation was 8% and TIBC was 258.  She has a history of stage IA left breast cancer in 02/2005.  She underwent lumpectomy.  Pathology revealed a T1cN0 lesion.  Tumor was ER + , PR +, and Her2/neu -.  Two sentinel lymph nodes were negative.  She received chemotherapy (4 cycles of AC and possibly an abbreviated course of Taxol- no records available).  She  received radiation.  She completed Femara in 08/2010.  She has fibromyalgia.  She has advanced thoracoabdominal aortic atherosclerosis with fusiform dilatation of the descending thoracic aorta up to 4.3 cm.  She is followed by Dr Lucky Cowboy.  Symptomatically, she has a cough unrelieved by [employer]  Chest pain has resolved.  Exam is stable.   Plan: 1.  Labs today:  CBC with diff, CMP, Mg. 2.  Week #3 carboplatin and Taxol today. 3.  Rx:  Robitussen AC 5 cc po TID prn cough. 4.  RTC in 1 week for MD assessment, labs (CBC with diff, CMP, Mg), and week #4 carboplatin and Taxol.   Lequita Asal, MD  08/11/2016, 10:15 AM

## 2016-08-11 ENCOUNTER — Inpatient Hospital Stay: Payer: Medicare HMO

## 2016-08-11 ENCOUNTER — Ambulatory Visit
Admission: RE | Admit: 2016-08-11 | Discharge: 2016-08-11 | Disposition: A | Discharge status: Home / Self Care | Payer: Medicare HMO | Source: Ambulatory Visit | Attending: Radiation Oncology | Admitting: Radiation Oncology

## 2016-08-11 ENCOUNTER — Encounter: Payer: Self-pay | Admitting: Hematology and Oncology

## 2016-08-11 ENCOUNTER — Ambulatory Visit: Payer: Medicare HMO

## 2016-08-11 ENCOUNTER — Other Ambulatory Visit: Payer: Self-pay

## 2016-08-11 ENCOUNTER — Inpatient Hospital Stay (HOSPITAL_BASED_OUTPATIENT_CLINIC_OR_DEPARTMENT_OTHER): Payer: Medicare HMO | Admitting: Hematology and Oncology

## 2016-08-11 VITALS — BP 133/79 | HR 94 | Temp 97.2°F | Resp 18 | Wt 114.4 lb

## 2016-08-11 DIAGNOSIS — R918 Other nonspecific abnormal finding of lung field: Secondary | ICD-10-CM | POA: Diagnosis not present

## 2016-08-11 DIAGNOSIS — F17210 Nicotine dependence, cigarettes, uncomplicated: Secondary | ICD-10-CM

## 2016-08-11 DIAGNOSIS — C3432 Malignant neoplasm of lower lobe, left bronchus or lung: Secondary | ICD-10-CM

## 2016-08-11 DIAGNOSIS — I110 Hypertensive heart disease with heart failure: Secondary | ICD-10-CM | POA: Diagnosis not present

## 2016-08-11 DIAGNOSIS — D649 Anemia, unspecified: Secondary | ICD-10-CM

## 2016-08-11 DIAGNOSIS — R05 Cough: Secondary | ICD-10-CM | POA: Diagnosis not present

## 2016-08-11 DIAGNOSIS — Z853 Personal history of malignant neoplasm of breast: Secondary | ICD-10-CM

## 2016-08-11 DIAGNOSIS — G629 Polyneuropathy, unspecified: Secondary | ICD-10-CM | POA: Diagnosis not present

## 2016-08-11 DIAGNOSIS — Z9223 Personal history of estrogen therapy: Secondary | ICD-10-CM | POA: Diagnosis not present

## 2016-08-11 DIAGNOSIS — J90 Pleural effusion, not elsewhere classified: Secondary | ICD-10-CM

## 2016-08-11 DIAGNOSIS — Z9981 Dependence on supplemental oxygen: Secondary | ICD-10-CM

## 2016-08-11 DIAGNOSIS — R748 Abnormal levels of other serum enzymes: Secondary | ICD-10-CM | POA: Diagnosis not present

## 2016-08-11 DIAGNOSIS — Z170 Estrogen receptor positive status [ER+]: Secondary | ICD-10-CM | POA: Diagnosis not present

## 2016-08-11 DIAGNOSIS — M810 Age-related osteoporosis without current pathological fracture: Secondary | ICD-10-CM

## 2016-08-11 DIAGNOSIS — R059 Cough, unspecified: Secondary | ICD-10-CM

## 2016-08-11 DIAGNOSIS — J449 Chronic obstructive pulmonary disease, unspecified: Secondary | ICD-10-CM | POA: Diagnosis not present

## 2016-08-11 DIAGNOSIS — Z87440 Personal history of urinary (tract) infections: Secondary | ICD-10-CM

## 2016-08-11 DIAGNOSIS — Z5111 Encounter for antineoplastic chemotherapy: Secondary | ICD-10-CM

## 2016-08-11 DIAGNOSIS — Z79899 Other long term (current) drug therapy: Secondary | ICD-10-CM

## 2016-08-11 DIAGNOSIS — Z7952 Long term (current) use of systemic steroids: Secondary | ICD-10-CM

## 2016-08-11 DIAGNOSIS — Z881 Allergy status to other antibiotic agents status: Secondary | ICD-10-CM

## 2016-08-11 DIAGNOSIS — Z510 Encounter for antineoplastic radiation therapy: Secondary | ICD-10-CM | POA: Diagnosis not present

## 2016-08-11 DIAGNOSIS — M797 Fibromyalgia: Secondary | ICD-10-CM

## 2016-08-11 DIAGNOSIS — Z923 Personal history of irradiation: Secondary | ICD-10-CM | POA: Diagnosis not present

## 2016-08-11 DIAGNOSIS — Z880 Allergy status to penicillin: Secondary | ICD-10-CM

## 2016-08-11 DIAGNOSIS — I700 Atherosclerosis of aorta: Secondary | ICD-10-CM

## 2016-08-11 DIAGNOSIS — C50912 Malignant neoplasm of unspecified site of left female breast: Secondary | ICD-10-CM

## 2016-08-11 DIAGNOSIS — I509 Heart failure, unspecified: Secondary | ICD-10-CM

## 2016-08-11 DIAGNOSIS — Z9221 Personal history of antineoplastic chemotherapy: Secondary | ICD-10-CM | POA: Diagnosis not present

## 2016-08-11 DIAGNOSIS — M069 Rheumatoid arthritis, unspecified: Secondary | ICD-10-CM

## 2016-08-11 LAB — CBC WITH DIFFERENTIAL/PLATELET
Basophils Absolute: 0 10*3/uL (ref 0–0.1)
Basophils Relative: 1 %
Eosinophils Absolute: 0.1 10*3/uL (ref 0–0.7)
Eosinophils Relative: 3 %
HCT: 29.9 % — ABNORMAL LOW (ref 35.0–47.0)
Hemoglobin: 10.3 g/dL — ABNORMAL LOW (ref 12.0–16.0)
Lymphocytes Relative: 13 %
Lymphs Abs: 0.7 10*3/uL — ABNORMAL LOW (ref 1.0–3.6)
MCH: 30.1 pg (ref 26.0–34.0)
MCHC: 34.5 g/dL (ref 32.0–36.0)
MCV: 87.4 fL (ref 80.0–100.0)
Monocytes Absolute: 0.6 10*3/uL (ref 0.2–0.9)
Monocytes Relative: 11 %
Neutro Abs: 3.8 10*3/uL (ref 1.4–6.5)
Neutrophils Relative %: 72 %
Platelets: 294 10*3/uL (ref 150–440)
RBC: 3.42 MIL/uL — ABNORMAL LOW (ref 3.80–5.20)
RDW: 16.3 % — ABNORMAL HIGH (ref 11.5–14.5)
WBC: 5.3 10*3/uL (ref 3.6–11.0)

## 2016-08-11 LAB — COMPREHENSIVE METABOLIC PANEL
ALT: 9 U/L — ABNORMAL LOW (ref 14–54)
AST: 18 U/L (ref 15–41)
Albumin: 2.9 g/dL — ABNORMAL LOW (ref 3.5–5.0)
Alkaline Phosphatase: 133 U/L — ABNORMAL HIGH (ref 38–126)
Anion gap: 9 (ref 5–15)
BUN: 15 mg/dL (ref 6–20)
CO2: 27 mmol/L (ref 22–32)
Calcium: 9 mg/dL (ref 8.9–10.3)
Chloride: 97 mmol/L — ABNORMAL LOW (ref 101–111)
Creatinine, Ser: 1.23 mg/dL — ABNORMAL HIGH (ref 0.44–1.00)
GFR calc Af Amer: 49 mL/min — ABNORMAL LOW (ref >60)
GFR calc non Af Amer: 42 mL/min — ABNORMAL LOW (ref >60)
Glucose, Bld: 135 mg/dL — ABNORMAL HIGH (ref 65–99)
Potassium: 3.9 mmol/L (ref 3.5–5.1)
Sodium: 133 mmol/L — ABNORMAL LOW (ref 135–145)
Total Bilirubin: 0.3 mg/dL (ref 0.3–1.2)
Total Protein: 6.6 g/dL (ref 6.5–8.1)

## 2016-08-11 LAB — MAGNESIUM: Magnesium: 1.9 mg/dL (ref 1.7–2.4)

## 2016-08-11 MED ORDER — FAMOTIDINE IN NACL 20-0.9 MG/50ML-% IV SOLN
20.0000 mg | Freq: Once | INTRAVENOUS | Status: AC
  Administered 2016-08-11: 20 mg via INTRAVENOUS
  Filled 2016-08-11: qty 50

## 2016-08-11 MED ORDER — SODIUM CHLORIDE 0.9 % IV SOLN
Freq: Once | INTRAVENOUS | Status: AC
  Administered 2016-08-11: 11:00:00 via INTRAVENOUS
  Filled 2016-08-11: qty 1000

## 2016-08-11 MED ORDER — SODIUM CHLORIDE 0.9 % IV SOLN
110.0000 mg | Freq: Once | INTRAVENOUS | Status: AC
  Administered 2016-08-11: 110 mg via INTRAVENOUS
  Filled 2016-08-11: qty 11

## 2016-08-11 MED ORDER — SODIUM CHLORIDE 0.9 % IV SOLN
45.0000 mg/m2 | Freq: Once | INTRAVENOUS | Status: AC
  Administered 2016-08-11: 66 mg via INTRAVENOUS
  Filled 2016-08-11: qty 11

## 2016-08-11 MED ORDER — DIPHENHYDRAMINE HCL 50 MG/ML IJ SOLN
25.0000 mg | Freq: Once | INTRAMUSCULAR | Status: AC
  Administered 2016-08-11: 25 mg via INTRAVENOUS
  Filled 2016-08-11: qty 1

## 2016-08-11 MED ORDER — HEPARIN SOD (PORK) LOCK FLUSH 100 UNIT/ML IV SOLN: 500.0000 [IU] | Freq: Once | INTRAVENOUS | Status: DC | PRN

## 2016-08-11 MED ORDER — PALONOSETRON HCL INJECTION 0.25 MG/5ML
0.2500 mg | Freq: Once | INTRAVENOUS | Status: AC
  Administered 2016-08-11: 0.25 mg via INTRAVENOUS
  Filled 2016-08-11: qty 5

## 2016-08-11 MED ORDER — SODIUM CHLORIDE 0.9 % IV SOLN
20.0000 mg | Freq: Once | INTRAVENOUS | Status: AC
  Administered 2016-08-11: 20 mg via INTRAVENOUS
  Filled 2016-08-11: qty 2

## 2016-08-11 MED ORDER — GUAIFENESIN-CODEINE 100-10 MG/5ML PO SOLN: 5.0000 mL | Freq: Three times a day (TID) | ORAL | 0 refills | Status: DC | PRN

## 2016-08-11 NOTE — Progress Notes (Signed)
Patient states she is having muscle and joint pain.  She continues to have a cough.  Tessalon is not helping her.  She has not felt good for past several days.  Patient states she has an appointment with Dr. Jacqlyn Larsen in August regarding urinary incontinence.

## 2016-08-12 ENCOUNTER — Ambulatory Visit: Payer: Medicare HMO | Admitting: Internal Medicine

## 2016-08-12 ENCOUNTER — Ambulatory Visit
Admission: RE | Admit: 2016-08-12 | Discharge: 2016-08-12 | Disposition: A | Discharge status: Home / Self Care | Payer: Medicare HMO | Source: Ambulatory Visit | Attending: Radiation Oncology | Admitting: Radiation Oncology

## 2016-08-12 DIAGNOSIS — R918 Other nonspecific abnormal finding of lung field: Secondary | ICD-10-CM | POA: Diagnosis not present

## 2016-08-12 DIAGNOSIS — I110 Hypertensive heart disease with heart failure: Secondary | ICD-10-CM | POA: Diagnosis not present

## 2016-08-12 DIAGNOSIS — G629 Polyneuropathy, unspecified: Secondary | ICD-10-CM | POA: Diagnosis not present

## 2016-08-12 DIAGNOSIS — J45909 Unspecified asthma, uncomplicated: Secondary | ICD-10-CM | POA: Diagnosis not present

## 2016-08-12 DIAGNOSIS — M069 Rheumatoid arthritis, unspecified: Secondary | ICD-10-CM | POA: Diagnosis not present

## 2016-08-12 DIAGNOSIS — Z510 Encounter for antineoplastic radiation therapy: Secondary | ICD-10-CM | POA: Diagnosis not present

## 2016-08-12 DIAGNOSIS — I509 Heart failure, unspecified: Secondary | ICD-10-CM | POA: Diagnosis not present

## 2016-08-12 DIAGNOSIS — J449 Chronic obstructive pulmonary disease, unspecified: Secondary | ICD-10-CM | POA: Diagnosis not present

## 2016-08-12 DIAGNOSIS — F17210 Nicotine dependence, cigarettes, uncomplicated: Secondary | ICD-10-CM | POA: Diagnosis not present

## 2016-08-12 DIAGNOSIS — C3432 Malignant neoplasm of lower lobe, left bronchus or lung: Secondary | ICD-10-CM | POA: Diagnosis not present

## 2016-08-12 DIAGNOSIS — M797 Fibromyalgia: Secondary | ICD-10-CM | POA: Diagnosis not present

## 2016-08-12 DIAGNOSIS — D638 Anemia in other chronic diseases classified elsewhere: Secondary | ICD-10-CM | POA: Diagnosis not present

## 2016-08-13 ENCOUNTER — Ambulatory Visit
Admission: RE | Admit: 2016-08-13 | Discharge: 2016-08-13 | Disposition: A | Discharge status: Home / Self Care | Payer: Medicare HMO | Source: Ambulatory Visit | Attending: Radiation Oncology | Admitting: Radiation Oncology

## 2016-08-13 DIAGNOSIS — J449 Chronic obstructive pulmonary disease, unspecified: Secondary | ICD-10-CM | POA: Diagnosis not present

## 2016-08-13 DIAGNOSIS — I509 Heart failure, unspecified: Secondary | ICD-10-CM | POA: Diagnosis not present

## 2016-08-13 DIAGNOSIS — C3432 Malignant neoplasm of lower lobe, left bronchus or lung: Secondary | ICD-10-CM | POA: Diagnosis not present

## 2016-08-13 DIAGNOSIS — F17210 Nicotine dependence, cigarettes, uncomplicated: Secondary | ICD-10-CM | POA: Diagnosis not present

## 2016-08-13 DIAGNOSIS — M797 Fibromyalgia: Secondary | ICD-10-CM | POA: Diagnosis not present

## 2016-08-13 DIAGNOSIS — Z510 Encounter for antineoplastic radiation therapy: Secondary | ICD-10-CM | POA: Diagnosis not present

## 2016-08-13 DIAGNOSIS — R918 Other nonspecific abnormal finding of lung field: Secondary | ICD-10-CM | POA: Diagnosis not present

## 2016-08-13 DIAGNOSIS — I110 Hypertensive heart disease with heart failure: Secondary | ICD-10-CM | POA: Diagnosis not present

## 2016-08-13 DIAGNOSIS — G629 Polyneuropathy, unspecified: Secondary | ICD-10-CM | POA: Diagnosis not present

## 2016-08-14 ENCOUNTER — Ambulatory Visit: Payer: Medicare HMO

## 2016-08-14 ENCOUNTER — Ambulatory Visit
Admission: RE | Admit: 2016-08-14 | Discharge: 2016-08-14 | Disposition: A | Discharge status: Home / Self Care | Payer: Medicare HMO | Source: Ambulatory Visit | Attending: Radiation Oncology | Admitting: Radiation Oncology

## 2016-08-14 ENCOUNTER — Ambulatory Visit: Payer: Medicare HMO | Admitting: Oncology

## 2016-08-14 ENCOUNTER — Other Ambulatory Visit: Payer: Medicare HMO

## 2016-08-14 DIAGNOSIS — Z510 Encounter for antineoplastic radiation therapy: Secondary | ICD-10-CM | POA: Diagnosis not present

## 2016-08-14 DIAGNOSIS — F17210 Nicotine dependence, cigarettes, uncomplicated: Secondary | ICD-10-CM | POA: Diagnosis not present

## 2016-08-14 DIAGNOSIS — M797 Fibromyalgia: Secondary | ICD-10-CM | POA: Diagnosis not present

## 2016-08-14 DIAGNOSIS — R918 Other nonspecific abnormal finding of lung field: Secondary | ICD-10-CM | POA: Diagnosis not present

## 2016-08-14 DIAGNOSIS — C3432 Malignant neoplasm of lower lobe, left bronchus or lung: Secondary | ICD-10-CM | POA: Diagnosis not present

## 2016-08-14 DIAGNOSIS — G629 Polyneuropathy, unspecified: Secondary | ICD-10-CM | POA: Diagnosis not present

## 2016-08-14 DIAGNOSIS — J449 Chronic obstructive pulmonary disease, unspecified: Secondary | ICD-10-CM | POA: Diagnosis not present

## 2016-08-14 DIAGNOSIS — I110 Hypertensive heart disease with heart failure: Secondary | ICD-10-CM | POA: Diagnosis not present

## 2016-08-14 DIAGNOSIS — I509 Heart failure, unspecified: Secondary | ICD-10-CM | POA: Diagnosis not present

## 2016-08-15 ENCOUNTER — Ambulatory Visit
Admission: RE | Admit: 2016-08-15 | Discharge: 2016-08-15 | Disposition: A | Discharge status: Home / Self Care | Payer: Medicare HMO | Source: Ambulatory Visit | Attending: Radiation Oncology | Admitting: Radiation Oncology

## 2016-08-15 ENCOUNTER — Encounter: Payer: Self-pay | Admitting: Internal Medicine

## 2016-08-15 DIAGNOSIS — J449 Chronic obstructive pulmonary disease, unspecified: Secondary | ICD-10-CM | POA: Diagnosis not present

## 2016-08-15 DIAGNOSIS — B9620 Unspecified Escherichia coli [E. coli] as the cause of diseases classified elsewhere: Secondary | ICD-10-CM | POA: Diagnosis not present

## 2016-08-15 DIAGNOSIS — G629 Polyneuropathy, unspecified: Secondary | ICD-10-CM | POA: Diagnosis not present

## 2016-08-15 DIAGNOSIS — G603 Idiopathic progressive neuropathy: Secondary | ICD-10-CM | POA: Diagnosis not present

## 2016-08-15 DIAGNOSIS — M150 Primary generalized (osteo)arthritis: Secondary | ICD-10-CM | POA: Diagnosis not present

## 2016-08-15 DIAGNOSIS — I2510 Atherosclerotic heart disease of native coronary artery without angina pectoris: Secondary | ICD-10-CM | POA: Diagnosis not present

## 2016-08-15 DIAGNOSIS — I5022 Chronic systolic (congestive) heart failure: Secondary | ICD-10-CM | POA: Diagnosis not present

## 2016-08-15 DIAGNOSIS — L659 Nonscarring hair loss, unspecified: Secondary | ICD-10-CM | POA: Diagnosis not present

## 2016-08-15 DIAGNOSIS — R918 Other nonspecific abnormal finding of lung field: Secondary | ICD-10-CM | POA: Diagnosis not present

## 2016-08-15 DIAGNOSIS — I509 Heart failure, unspecified: Secondary | ICD-10-CM | POA: Diagnosis not present

## 2016-08-15 DIAGNOSIS — M797 Fibromyalgia: Secondary | ICD-10-CM | POA: Diagnosis not present

## 2016-08-15 DIAGNOSIS — F17210 Nicotine dependence, cigarettes, uncomplicated: Secondary | ICD-10-CM | POA: Diagnosis not present

## 2016-08-15 DIAGNOSIS — N390 Urinary tract infection, site not specified: Secondary | ICD-10-CM | POA: Diagnosis not present

## 2016-08-15 DIAGNOSIS — I110 Hypertensive heart disease with heart failure: Secondary | ICD-10-CM | POA: Diagnosis not present

## 2016-08-15 DIAGNOSIS — Z510 Encounter for antineoplastic radiation therapy: Secondary | ICD-10-CM | POA: Diagnosis not present

## 2016-08-15 DIAGNOSIS — E782 Mixed hyperlipidemia: Secondary | ICD-10-CM | POA: Diagnosis not present

## 2016-08-15 DIAGNOSIS — C3432 Malignant neoplasm of lower lobe, left bronchus or lung: Secondary | ICD-10-CM | POA: Diagnosis not present

## 2016-08-15 DIAGNOSIS — Z09 Encounter for follow-up examination after completed treatment for conditions other than malignant neoplasm: Secondary | ICD-10-CM | POA: Diagnosis not present

## 2016-08-16 DIAGNOSIS — I5022 Chronic systolic (congestive) heart failure: Secondary | ICD-10-CM | POA: Diagnosis not present

## 2016-08-16 DIAGNOSIS — R0602 Shortness of breath: Secondary | ICD-10-CM | POA: Diagnosis not present

## 2016-08-18 ENCOUNTER — Inpatient Hospital Stay: Payer: Medicare HMO

## 2016-08-18 ENCOUNTER — Other Ambulatory Visit: Payer: Self-pay | Admitting: Hematology and Oncology

## 2016-08-18 ENCOUNTER — Ambulatory Visit: Payer: Medicare HMO

## 2016-08-18 ENCOUNTER — Other Ambulatory Visit: Payer: Self-pay | Admitting: *Deleted

## 2016-08-18 ENCOUNTER — Inpatient Hospital Stay: Payer: Medicare HMO | Admitting: Hematology and Oncology

## 2016-08-18 NOTE — Progress Notes (Deleted)
[facility] day:  08/18/2016   Chief Complaint: [name] [name] is a 76 y.o. 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  HPI: The patient was last seen in the medical oncology clinic on 08/11/2016.  At that time, she had a cough unrelieved by Ladona Ridgel.  Chest pain had resolved.  She received week #3 chemotherapy.     Past Medical History:  Diagnosis Date  . Asthma   . Breast cancer ([place]) 2009   left  . Cancer ([place])   . CHF (congestive heart failure) ([place])   . CHF (congestive heart failure) ([place])   . Chronic UTI   . COPD (chronic obstructive pulmonary disease) ([place])   . Dizziness   . Fibromyalgia   . Hypertension   . Neuropathy   . Personal history of tobacco use, presenting hazards to health 05/17/2015  . Polyp, larynx   . RA (rheumatoid arthritis) ([place])   . Sinus infection    recent  . Stumbling gait    to the left  . Supplemental oxygen dependent    2.5l    Past Surgical History:  Procedure Laterality Date  . ABDOMINAL HYSTERECTOMY    . BREAST LUMPECTOMY Left 2009   chemo and radiation  . CYST EXCISION Left 02/27/2015   Procedure: CYST REMOVAL;  Surgeon: Hessie Knows, MD;  Location: [facility] ORS;  Service: Orthopedics;  Laterality: Left;  . EYE MUSCLE SURGERY Right    13 surgeries  . FLEXIBLE BRONCHOSCOPY N/A 07/01/2016   Procedure: FLEXIBLE BRONCHOSCOPY;  Surgeon: Wilhelmina Mcardle, MD;  Location: [facility] ORS;  Service: Pulmonary;  Laterality: N/A;  . THUMB ARTHROSCOPY Left     Family History  Problem Relation Age of Onset  . Diabetes Father   . Stroke Father   . Heart attack Father   . CAD Sister     Social History:  reports that she has been smoking Cigarettes.  She has a 40.00 pack-year smoking history. She has never used smokeless tobacco. She reports that she does not drink alcohol or use  drugs.  He lives with her son and grandson. The patient is alone today.   Allergies:  Allergies  Allergen Reactions  . Contrast Media [Iodinated Diagnostic Agents] Other (See Comments)    Pt was sent to the ED following contrast media injection at [place]. Unknown reason. She has been premedicated since without complications. Pt to be premedicated prior to contrast media injections  . Ioxaglate Other (See Comments)    Pt was sent to the ED following contrast media injection at [place]. Unknown reason. She has been premedicated since without complications. Pt to be premedicated prior to contrast media injections  . Sulfa Antibiotics     Other reaction(s): Other (See Comments)  . Tetracycline Hives and Other (See Comments)  . White Petrolatum Other (See Comments)  . Amoxicillin-Pot Clavulanate Rash    Other reaction(s): Unknown Other reaction(s): Unknown _0  Other reaction(s): Unknown Blisters in mouth Blisters in mouth  . Tape Rash    Current Medications: Current Outpatient Prescriptions  Medication Sig Dispense Refill  . benzonatate (TESSALON) 200 MG capsule Take 1 capsule (200 mg total) by mouth 3 (three) times daily as needed for cough. 20 capsule 0  . Calcium-Vitamin D 600-200  MG-UNIT tablet Take 1 tablet by mouth daily.     . carvedilol (COREG) 12.5 MG tablet Take 1 tablet (12.5 mg total) by mouth 2 (two) times daily with a meal. 60 tablet 0  . cholecalciferol (VITAMIN D) 1000 units tablet Take 1,000 Units by mouth daily.    . clonazePAM (KLONOPIN) 1 MG tablet Take 1 mg by mouth 2 (two) times daily as needed for anxiety.     . gabapentin (NEURONTIN) 600 MG tablet Take 600 mg by mouth 3 (three) times daily.     [name] guaiFENesin-codeine 100-10 MG/5ML syrup Take 5 mLs by mouth 3 (three) times daily as needed for cough. 120 mL 0  . HYDROcodone-acetaminophen (NORCO/VICODIN)  5-325 MG tablet Take 1-2 tablets by mouth every 4 (four) hours as needed for moderate pain.     [name] imipramine (TOFRANIL) 25 MG tablet Take 1 tablet by mouth at bedtime.     . iron polysaccharides (FERREX 150) 150 MG capsule Take 150 mg by mouth daily.    [name] LORazepam (ATIVAN) 0.5 MG tablet Take 1 tablet (0.5 mg total) by mouth every 6 (six) hours as needed (Nausea or vomiting). 30 tablet 0  . Multiple Vitamin (MULTIVITAMIN) capsule Take 1 capsule by mouth daily.    . nitrofurantoin, macrocrystal-monohydrate, (MACROBID) 100 MG capsule Take 100 mg by mouth 2 (two) times daily.    . ondansetron (ZOFRAN) 8 MG tablet Take 1 tablet (8 mg total) by mouth 2 (two) times daily as needed for refractory nausea / vomiting. Start on day 3 after chemo. 30 tablet 1  . predniSONE (DELTASONE) 20 MG tablet Take 2 tablets (40 mg total) by mouth daily with breakfast. 10 days 20 tablet 0  . sucralfate (CARAFATE) 1 g tablet Take 1 tablet (1 g total) by mouth 3 (three) times daily. 90 tablet 6  . Tiotropium Bromide Monohydrate (SPIRIVA RESPIMAT) 2.5 MCG/ACT AERS Inhale 2.5 mcg into the lungs 2 (two) times daily. (Patient taking differently: Inhale 2.5 mcg into the lungs daily. ) 1 Inhaler 0  . traMADol (ULTRAM) 50 MG tablet Take 50 mg by mouth daily.     . vitamin C (ASCORBIC ACID) 500 MG tablet Take 500 mg by mouth daily.    [name] ZINC SULFATE PO Take 1 tablet by mouth daily.     [name] zolpidem (AMBIEN) 5 MG tablet Take 5 mg by mouth at bedtime.     No current facility-administered medications for this visit.     Review of Systems:  GENERAL:  "Don't feel like smiling".  No fevers or sweats.  Weight down 1 pound. PERFORMANCE STATUS (ECOG):  2 HEENT:  No visual changes, runny nose, sore throat, mouth sores or tenderness. Lungs: Shortness of breath (stable).  Cough.  No hemoptysis.  On oxygen 2 liter/min visa [place]. Cardiac:  Mid sternal chest pain, resolved.  No palpitations, orthopnea, or PND. GI:  No nausea, vomiting, diarrhea,  constipation, melena or hematochezia. GU:  No urgency, frequency, dysuria, or hematuria. Recent UTI. Musculoskeletal:  Fibromyalgia.  Osteoporosis.  No back pain.  No joint pain.  No muscle tenderness. Extremities:  No pain or swelling. Skin:  Decubitus.  No rashes or skin changes. Neuro:  No headache, numbness or weakness, balance or coordination issues. Endocrine:  No diabetes, thyroid issues, hot flashes or night sweats. Psych:  No mood changes, depression or anxiety. Pain:  No focal pain. Review of systems:  All other systems reviewed and found to be negative.  Physical Exam: There were no  vitals taken for this visit. O2 sats 95%. GENERAL:  Thin woman sitting comfortably in a wheelchair in the exam room in no acute distress. MENTAL STATUS:  Alert and oriented to person, place and time. HEAD:  Short blonde hair.  Normocephalic, atraumatic, face symmetric, no Cushingoid features. EYES:  Blue eyes.  No conjunctivitis or scleral icterus. ENT:  Oropharynx clear without lesion.  Tongue normal. Mucous membranes moist.  RESPIRATORY:  Clear to auscultation.  No rales, wheezes or rhonchi. CARDIOVASCULAR:  Regular rate and rhythm without murmur, rub or gallop. SKIN:  No rashes, ulcers or lesions. EXTREMITIES: Arthritic changes in hands.  Tender to palpation.  No edema, no skin discoloration or tenderness.  No palpable cords. LYMPH NODES: No palpable cervical, supraclavicular, axillary or inguinal adenopathy  NEUROLOGICAL: Alert and interactive.  Unremarkable. PSYCH:  Appropriate.   No visits with results within 3 Day(s) from this visit.  Latest known visit with results is:  Orders Only on 08/11/2016  Component Date Value Ref Range Status  . WBC 08/11/2016 5.3  3.6 - 11.0 K/uL Final  . RBC 08/11/2016 3.42* 3.80 - 5.20 MIL/uL Final  . Hemoglobin 08/11/2016 10.3* 12.0 - 16.0 g/dL Final  . HCT 08/11/2016 29.9* 35.0 - 47.0 % Final  . MCV 08/11/2016 87.4  80.0 - 100.0 fL Final  . MCH  08/11/2016 30.1  26.0 - 34.0 pg Final  . MCHC 08/11/2016 34.5  32.0 - 36.0 g/dL Final  . RDW 08/11/2016 16.3* 11.5 - 14.5 % Final  . Platelets 08/11/2016 294  150 - 440 K/uL Final  . Neutrophils Relative % 08/11/2016 72  % Final  . Neutro Abs 08/11/2016 3.8  1.4 - 6.5 K/uL Final  . Lymphocytes Relative 08/11/2016 13  % Final  . Lymphs Abs 08/11/2016 0.7* 1.0 - 3.6 K/uL Final  . Monocytes Relative 08/11/2016 11  % Final  . Monocytes Absolute 08/11/2016 0.6  0.2 - 0.9 K/uL Final  . Eosinophils Relative 08/11/2016 3  % Final  . Eosinophils Absolute 08/11/2016 0.1  0 - 0.7 K/uL Final  . Basophils Relative 08/11/2016 1  % Final  . Basophils Absolute 08/11/2016 0.0  0 - 0.1 K/uL Final  . Sodium 08/11/2016 133* 135 - 145 mmol/L Final  . Potassium 08/11/2016 3.9  3.5 - 5.1 mmol/L Final  . Chloride 08/11/2016 97* 101 - 111 mmol/L Final  . CO2 08/11/2016 27  22 - 32 mmol/L Final  . Glucose, Bld 08/11/2016 135* 65 - 99 mg/dL Final  . BUN 08/11/2016 15  6 - 20 mg/dL Final  . Creatinine, Ser 08/11/2016 1.23* 0.44 - 1.00 mg/dL Final  . Calcium 08/11/2016 9.0  8.9 - 10.3 mg/dL Final  . Total Protein 08/11/2016 6.6  6.5 - 8.1 g/dL Final  . Albumin 08/11/2016 2.9* 3.5 - 5.0 g/dL Final  . AST 08/11/2016 18  15 - 41 U/L Final  . ALT 08/11/2016 9* 14 - 54 U/L Final  . Alkaline Phosphatase 08/11/2016 133* 38 - 126 U/L Final  . Total Bilirubin 08/11/2016 0.3  0.3 - 1.2 mg/dL Final  . GFR calc non Af Amer 08/11/2016 42* >60 mL/min Final  . GFR calc Af Amer 08/11/2016 49* >60 mL/min Final   Comment: (NOTE) The eGFR has been calculated using the CKD EPI equation. This calculation has not been validated in all clinical situations. eGFR's persistently <60 mL/min signify possible Chronic Kidney Disease.   . Anion gap 08/11/2016 9  5 - 15 Final  . Magnesium 08/11/2016 1.9  1.7 - 2.4 mg/dL Final    Assessment:  [name] [name] is a 76 y.o. female with a clinical T2bNxM0 sqamous cell lung cancer of the left  lung s/p bronchoscopy and biopsy on 07/01/2016.  She has a 40 pack year smoking history.  She presented with left lower chest wall pain.  Chest CT with contrast on 06/11/2016 revealed a 3.2 x 3.0 mass like area of focal opacity in the medial left lower lobe with obliteration of segmental airways to the anterior left lower lobe.  PET scan on 06/27/2016 revealed a 4.3 x 3.2 cm central left lower lobe lung lesion (SUV 14.3) consistent with primary bronchogenic carcinoma.  There was equivocal nodal tissue in the subcarinal station demonstrating mild hypermetabolism (SUV 3.6). There was more peripheral left lower lobe increased atelectasis with mucoid impaction which is likely secondary to endobronchial obstruction.  There was a small left pleural effusion.  Head MRI on 07/07/2016 revealed no evidence of metastatic disease.  She began radiation on 07/22/2016.  She is s/p week #3 concurrent carboplatin and Taxol (07/24/2016 - 08/11/2016). She requires a reduced dose of Benadryl (25 mg) for her premedication.  Week #3 was postponed secondary to chest pain and evaluation.  She has a normocytic anemia likely due to chemotherapy.  Work-up on 07/24/2016 revealed the following normal labs: ferritin (103), B12 (345), folate(19.7).  Iron saturation was 8% and TIBC was 258.  She has a history of stage IA left breast cancer in 02/2005.  She underwent lumpectomy.  Pathology revealed a T1cN0 lesion.  Tumor was ER + , PR +, and Her2/neu -.  Two sentinel lymph nodes were negative.  She received chemotherapy (4 cycles of AC and possibly an abbreviated course of Taxol- no records available).  She received radiation.  She completed Femara in 08/2010.  She has fibromyalgia.  She has advanced thoracoabdominal aortic atherosclerosis with fusiform dilatation of the descending thoracic aorta up to 4.3 cm.  She is followed by Dr Lucky Cowboy.  Symptomatically, she has a cough unrelieved by [employer].  Chest pain has resolved.  Exam  is stable.   Plan: 1.  Labs today:  CBC with diff, CMP, Mg. 2.  Week #4 carboplatin and Taxol today.  3.  Rx:  Robitussen AC 5 cc po TID prn cough. 4.  RTC in 1 week for MD assessment, labs (CBC with diff, CMP, Mg), and week #4 carboplatin and Taxol.   Lequita Asal, MD  08/18/2016, 3:56 AM

## 2016-08-19 ENCOUNTER — Ambulatory Visit: Payer: Medicare HMO

## 2016-08-20 ENCOUNTER — Ambulatory Visit: Admission: RE | Admit: 2016-08-20 | Payer: Medicare HMO | Source: Ambulatory Visit

## 2016-08-21 ENCOUNTER — Ambulatory Visit: Payer: Medicare HMO

## 2016-08-22 ENCOUNTER — Ambulatory Visit: Payer: Medicare HMO

## 2016-08-22 ENCOUNTER — Inpatient Hospital Stay: Payer: Medicare HMO

## 2016-08-22 ENCOUNTER — Inpatient Hospital Stay: Payer: Medicare HMO | Admitting: Hematology and Oncology

## 2016-08-22 ENCOUNTER — Other Ambulatory Visit: Payer: Self-pay | Admitting: Hematology and Oncology

## 2016-08-22 NOTE — Progress Notes (Deleted)
[facility] day:  08/22/2016   Chief Complaint: [name] [name] is a 76 y.o. 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  HPI: The patient was last seen in the medical oncology clinic on 08/11/2016.  At that time, she had a cough unrelieved by Ladona Ridgel.  Chest pain had resolved.  She received week #3 chemotherapy.     Past Medical History:  Diagnosis Date  . Asthma   . Breast cancer ([place]) 2009   left  . Cancer ([place])   . CHF (congestive heart failure) ([place])   . CHF (congestive heart failure) ([place])   . Chronic UTI   . COPD (chronic obstructive pulmonary disease) ([place])   . Dizziness   . Fibromyalgia   . Hypertension   . Neuropathy   . Personal history of tobacco use, presenting hazards to health 05/17/2015  . Polyp, larynx   . RA (rheumatoid arthritis) ([place])   . Sinus infection    recent  . Stumbling gait    to the left  . Supplemental oxygen dependent    2.5l    Past Surgical History:  Procedure Laterality Date  . ABDOMINAL HYSTERECTOMY    . BREAST LUMPECTOMY Left 2009   chemo and radiation  . CYST EXCISION Left 02/27/2015   Procedure: CYST REMOVAL;  Surgeon: Hessie Knows, MD;  Location: [facility] ORS;  Service: Orthopedics;  Laterality: Left;  . EYE MUSCLE SURGERY Right    13 surgeries  . FLEXIBLE BRONCHOSCOPY N/A 07/01/2016   Procedure: FLEXIBLE BRONCHOSCOPY;  Surgeon: Wilhelmina Mcardle, MD;  Location: [facility] ORS;  Service: Pulmonary;  Laterality: N/A;  . THUMB ARTHROSCOPY Left     Family History  Problem Relation Age of Onset  . Diabetes Father   . Stroke Father   . Heart attack Father   . CAD Sister     Social History:  reports that she has been smoking Cigarettes.  She has a 40.00 pack-year smoking history. She has never used smokeless tobacco. She reports that she does not drink alcohol or  use drugs.  He lives with her son and grandson. The patient is alone today.   Allergies:  Allergies  Allergen Reactions  . Contrast Media [Iodinated Diagnostic Agents] Other (See Comments)    Pt was sent to the ED following contrast media injection at [place]. Unknown reason. She has been premedicated since without complications. Pt to be premedicated prior to contrast media injections  . Ioxaglate Other (See Comments)    Pt was sent to the ED following contrast media injection at [place]. Unknown reason. She has been premedicated since without complications. Pt to be premedicated prior to contrast media injections  . Sulfa Antibiotics     Other reaction(s): Other (See Comments)  . Tetracycline Hives and Other (See Comments)  . White Petrolatum Other (See Comments)  . Amoxicillin-Pot Clavulanate Rash    Other reaction(s): Unknown Other reaction(s): Unknown _0  Other reaction(s): Unknown Blisters in mouth Blisters in mouth  . Tape Rash    Current Medications: Current Outpatient Prescriptions  Medication Sig Dispense Refill  . benzonatate (TESSALON) 200 MG capsule Take 1 capsule (200 mg total) by mouth 3 (three) times daily as needed for cough. 20 capsule 0  . Calcium-Vitamin D 600-200  MG-UNIT tablet Take 1 tablet by mouth daily.     . carvedilol (COREG) 12.5 MG tablet Take 1 tablet (12.5 mg total) by mouth 2 (two) times daily with a meal. 60 tablet 0  . cholecalciferol (VITAMIN D) 1000 units tablet Take 1,000 Units by mouth daily.    . clonazePAM (KLONOPIN) 1 MG tablet Take 1 mg by mouth 2 (two) times daily as needed for anxiety.     . gabapentin (NEURONTIN) 600 MG tablet Take 600 mg by mouth 3 (three) times daily.     [name] guaiFENesin-codeine 100-10 MG/5ML syrup Take 5 mLs by mouth 3 (three) times daily as needed for cough. 120 mL 0  . HYDROcodone-acetaminophen (NORCO/VICODIN)  5-325 MG tablet Take 1-2 tablets by mouth every 4 (four) hours as needed for moderate pain.     [name] imipramine (TOFRANIL) 25 MG tablet Take 1 tablet by mouth at bedtime.     . iron polysaccharides (FERREX 150) 150 MG capsule Take 150 mg by mouth daily.    [name] LORazepam (ATIVAN) 0.5 MG tablet Take 1 tablet (0.5 mg total) by mouth every 6 (six) hours as needed (Nausea or vomiting). 30 tablet 0  . Multiple Vitamin (MULTIVITAMIN) capsule Take 1 capsule by mouth daily.    . nitrofurantoin, macrocrystal-monohydrate, (MACROBID) 100 MG capsule Take 100 mg by mouth 2 (two) times daily.    . ondansetron (ZOFRAN) 8 MG tablet Take 1 tablet (8 mg total) by mouth 2 (two) times daily as needed for refractory nausea / vomiting. Start on day 3 after chemo. 30 tablet 1  . predniSONE (DELTASONE) 20 MG tablet Take 2 tablets (40 mg total) by mouth daily with breakfast. 10 days 20 tablet 0  . sucralfate (CARAFATE) 1 g tablet Take 1 tablet (1 g total) by mouth 3 (three) times daily. 90 tablet 6  . Tiotropium Bromide Monohydrate (SPIRIVA RESPIMAT) 2.5 MCG/ACT AERS Inhale 2.5 mcg into the lungs 2 (two) times daily. (Patient taking differently: Inhale 2.5 mcg into the lungs daily. ) 1 Inhaler 0  . traMADol (ULTRAM) 50 MG tablet Take 50 mg by mouth daily.     . vitamin C (ASCORBIC ACID) 500 MG tablet Take 500 mg by mouth daily.    [name] ZINC SULFATE PO Take 1 tablet by mouth daily.     [name] zolpidem (AMBIEN) 5 MG tablet Take 5 mg by mouth at bedtime.     No current facility-administered medications for this visit.     Review of Systems:  GENERAL:  "Don't feel like smiling".  No fevers or sweats.  Weight down 1 pound. PERFORMANCE STATUS (ECOG):  2 HEENT:  No visual changes, runny nose, sore throat, mouth sores or tenderness. Lungs: Shortness of breath (stable).  Cough.  No hemoptysis.  On oxygen 2 liter/min visa [place]. Cardiac:  Mid sternal chest pain, resolved.  No palpitations, orthopnea, or PND. GI:  No nausea, vomiting, diarrhea,  constipation, melena or hematochezia. GU:  No urgency, frequency, dysuria, or hematuria. Recent UTI. Musculoskeletal:  Fibromyalgia.  Osteoporosis.  No back pain.  No joint pain.  No muscle tenderness. Extremities:  No pain or swelling. Skin:  Decubitus.  No rashes or skin changes. Neuro:  No headache, numbness or weakness, balance or coordination issues. Endocrine:  No diabetes, thyroid issues, hot flashes or night sweats. Psych:  No mood changes, depression or anxiety. Pain:  No focal pain. Review of systems:  All other systems reviewed and found to be negative.  Physical Exam: There were no  vitals taken for this visit. O2 sats 95%. GENERAL:  Thin woman sitting comfortably in a wheelchair in the exam room in no acute distress. MENTAL STATUS:  Alert and oriented to person, place and time. HEAD:  Short blonde hair.  Normocephalic, atraumatic, face symmetric, no Cushingoid features. EYES:  Blue eyes.  No conjunctivitis or scleral icterus. ENT:  Oropharynx clear without lesion.  Tongue normal. Mucous membranes moist.  RESPIRATORY:  Clear to auscultation.  No rales, wheezes or rhonchi. CARDIOVASCULAR:  Regular rate and rhythm without murmur, rub or gallop. SKIN:  No rashes, ulcers or lesions. EXTREMITIES: Arthritic changes in hands.  Tender to palpation.  No edema, no skin discoloration or tenderness.  No palpable cords. LYMPH NODES: No palpable cervical, supraclavicular, axillary or inguinal adenopathy  NEUROLOGICAL: Alert and interactive.  Unremarkable. PSYCH:  Appropriate.   No visits with results within 3 Day(s) from this visit.  Latest known visit with results is:  Orders Only on 08/11/2016  Component Date Value Ref Range Status  . WBC 08/11/2016 5.3  3.6 - 11.0 K/uL Final  . RBC 08/11/2016 3.42* 3.80 - 5.20 MIL/uL Final  . Hemoglobin 08/11/2016 10.3* 12.0 - 16.0 g/dL Final  . HCT 08/11/2016 29.9* 35.0 - 47.0 % Final  . MCV 08/11/2016 87.4  80.0 - 100.0 fL Final  . MCH  08/11/2016 30.1  26.0 - 34.0 pg Final  . MCHC 08/11/2016 34.5  32.0 - 36.0 g/dL Final  . RDW 08/11/2016 16.3* 11.5 - 14.5 % Final  . Platelets 08/11/2016 294  150 - 440 K/uL Final  . Neutrophils Relative % 08/11/2016 72  % Final  . Neutro Abs 08/11/2016 3.8  1.4 - 6.5 K/uL Final  . Lymphocytes Relative 08/11/2016 13  % Final  . Lymphs Abs 08/11/2016 0.7* 1.0 - 3.6 K/uL Final  . Monocytes Relative 08/11/2016 11  % Final  . Monocytes Absolute 08/11/2016 0.6  0.2 - 0.9 K/uL Final  . Eosinophils Relative 08/11/2016 3  % Final  . Eosinophils Absolute 08/11/2016 0.1  0 - 0.7 K/uL Final  . Basophils Relative 08/11/2016 1  % Final  . Basophils Absolute 08/11/2016 0.0  0 - 0.1 K/uL Final  . Sodium 08/11/2016 133* 135 - 145 mmol/L Final  . Potassium 08/11/2016 3.9  3.5 - 5.1 mmol/L Final  . Chloride 08/11/2016 97* 101 - 111 mmol/L Final  . CO2 08/11/2016 27  22 - 32 mmol/L Final  . Glucose, Bld 08/11/2016 135* 65 - 99 mg/dL Final  . BUN 08/11/2016 15  6 - 20 mg/dL Final  . Creatinine, Ser 08/11/2016 1.23* 0.44 - 1.00 mg/dL Final  . Calcium 08/11/2016 9.0  8.9 - 10.3 mg/dL Final  . Total Protein 08/11/2016 6.6  6.5 - 8.1 g/dL Final  . Albumin 08/11/2016 2.9* 3.5 - 5.0 g/dL Final  . AST 08/11/2016 18  15 - 41 U/L Final  . ALT 08/11/2016 9* 14 - 54 U/L Final  . Alkaline Phosphatase 08/11/2016 133* 38 - 126 U/L Final  . Total Bilirubin 08/11/2016 0.3  0.3 - 1.2 mg/dL Final  . GFR calc non Af Amer 08/11/2016 42* >60 mL/min Final  . GFR calc Af Amer 08/11/2016 49* >60 mL/min Final   Comment: (NOTE) The eGFR has been calculated using the CKD EPI equation. This calculation has not been validated in all clinical situations. eGFR's persistently <60 mL/min signify possible Chronic Kidney Disease.   . Anion gap 08/11/2016 9  5 - 15 Final  . Magnesium 08/11/2016 1.9  1.7 - 2.4 mg/dL Final    Assessment:  [name] [name] is a 76 y.o. female with a clinical T2bNxM0 sqamous cell lung cancer of the left  lung s/p bronchoscopy and biopsy on 07/01/2016.  She has a 40 pack year smoking history.  She presented with left lower chest wall pain.  Chest CT with contrast on 06/11/2016 revealed a 3.2 x 3.0 mass like area of focal opacity in the medial left lower lobe with obliteration of segmental airways to the anterior left lower lobe.  PET scan on 06/27/2016 revealed a 4.3 x 3.2 cm central left lower lobe lung lesion (SUV 14.3) consistent with primary bronchogenic carcinoma.  There was equivocal nodal tissue in the subcarinal station demonstrating mild hypermetabolism (SUV 3.6). There was more peripheral left lower lobe increased atelectasis with mucoid impaction which is likely secondary to endobronchial obstruction.  There was a small left pleural effusion.  Head MRI on 07/07/2016 revealed no evidence of metastatic disease.  She began radiation on 07/22/2016.  She is s/p week #3 concurrent carboplatin and Taxol (07/24/2016 - 08/11/2016). She requires a reduced dose of Benadryl (25 mg) for her premedication.  Week #3 was postponed secondary to chest pain and evaluation.  She has a normocytic anemia likely due to chemotherapy.  Work-up on 07/24/2016 revealed the following normal labs: ferritin (103), B12 (345), folate(19.7).  Iron saturation was 8% and TIBC was 258.  She has a history of stage IA left breast cancer in 02/2005.  She underwent lumpectomy.  Pathology revealed a T1cN0 lesion.  Tumor was ER + , PR +, and Her2/neu -.  Two sentinel lymph nodes were negative.  She received chemotherapy (4 cycles of AC and possibly an abbreviated course of Taxol- no records available).  She received radiation.  She completed Femara in 08/2010.  She has fibromyalgia.  She has advanced thoracoabdominal aortic atherosclerosis with fusiform dilatation of the descending thoracic aorta up to 4.3 cm.  She is followed by Dr Lucky Cowboy.  Symptomatically, she has a cough unrelieved by [employer].  Chest pain has resolved.  Exam  is stable.   Plan: 1.  Labs today:  CBC with diff, CMP, Mg. 2.  Week #4 carboplatin and Taxol today.  3.  Rx:  Robitussen AC 5 cc po TID prn cough. 4.  RTC in 1 week for MD assessment, labs (CBC with diff, CMP, Mg), and week #4 carboplatin and Taxol.   Lequita Asal, MD  08/22/2016, 4:22 AM

## 2016-08-25 ENCOUNTER — Ambulatory Visit: Payer: Medicare HMO

## 2016-08-26 ENCOUNTER — Ambulatory Visit
Admission: RE | Admit: 2016-08-26 | Discharge: 2016-08-26 | Disposition: A | Discharge status: Home / Self Care | Payer: Medicare HMO | Source: Ambulatory Visit | Attending: Radiation Oncology | Admitting: Radiation Oncology

## 2016-08-26 DIAGNOSIS — C3432 Malignant neoplasm of lower lobe, left bronchus or lung: Secondary | ICD-10-CM | POA: Diagnosis not present

## 2016-08-26 DIAGNOSIS — I110 Hypertensive heart disease with heart failure: Secondary | ICD-10-CM | POA: Diagnosis not present

## 2016-08-26 DIAGNOSIS — J449 Chronic obstructive pulmonary disease, unspecified: Secondary | ICD-10-CM | POA: Diagnosis not present

## 2016-08-26 DIAGNOSIS — R918 Other nonspecific abnormal finding of lung field: Secondary | ICD-10-CM | POA: Diagnosis not present

## 2016-08-26 DIAGNOSIS — M797 Fibromyalgia: Secondary | ICD-10-CM | POA: Diagnosis not present

## 2016-08-26 DIAGNOSIS — F17210 Nicotine dependence, cigarettes, uncomplicated: Secondary | ICD-10-CM | POA: Diagnosis not present

## 2016-08-26 DIAGNOSIS — M069 Rheumatoid arthritis, unspecified: Secondary | ICD-10-CM | POA: Diagnosis not present

## 2016-08-26 DIAGNOSIS — J45909 Unspecified asthma, uncomplicated: Secondary | ICD-10-CM | POA: Diagnosis not present

## 2016-08-26 DIAGNOSIS — D638 Anemia in other chronic diseases classified elsewhere: Secondary | ICD-10-CM | POA: Diagnosis not present

## 2016-08-26 DIAGNOSIS — Z510 Encounter for antineoplastic radiation therapy: Secondary | ICD-10-CM | POA: Diagnosis not present

## 2016-08-26 DIAGNOSIS — G629 Polyneuropathy, unspecified: Secondary | ICD-10-CM | POA: Diagnosis not present

## 2016-08-26 DIAGNOSIS — I509 Heart failure, unspecified: Secondary | ICD-10-CM | POA: Diagnosis not present

## 2016-08-27 ENCOUNTER — Ambulatory Visit
Admission: RE | Admit: 2016-08-27 | Discharge: 2016-08-27 | Disposition: A | Discharge status: Home / Self Care | Payer: Medicare HMO | Source: Ambulatory Visit | Attending: Radiation Oncology | Admitting: Radiation Oncology

## 2016-08-27 DIAGNOSIS — I110 Hypertensive heart disease with heart failure: Secondary | ICD-10-CM | POA: Diagnosis not present

## 2016-08-27 DIAGNOSIS — M797 Fibromyalgia: Secondary | ICD-10-CM | POA: Diagnosis not present

## 2016-08-27 DIAGNOSIS — Z510 Encounter for antineoplastic radiation therapy: Secondary | ICD-10-CM | POA: Diagnosis not present

## 2016-08-27 DIAGNOSIS — I509 Heart failure, unspecified: Secondary | ICD-10-CM | POA: Diagnosis not present

## 2016-08-27 DIAGNOSIS — J449 Chronic obstructive pulmonary disease, unspecified: Secondary | ICD-10-CM | POA: Diagnosis not present

## 2016-08-27 DIAGNOSIS — G629 Polyneuropathy, unspecified: Secondary | ICD-10-CM | POA: Diagnosis not present

## 2016-08-27 DIAGNOSIS — R918 Other nonspecific abnormal finding of lung field: Secondary | ICD-10-CM | POA: Diagnosis not present

## 2016-08-27 DIAGNOSIS — F17210 Nicotine dependence, cigarettes, uncomplicated: Secondary | ICD-10-CM | POA: Diagnosis not present

## 2016-08-27 DIAGNOSIS — C3432 Malignant neoplasm of lower lobe, left bronchus or lung: Secondary | ICD-10-CM | POA: Diagnosis not present

## 2016-08-28 ENCOUNTER — Ambulatory Visit: Payer: Medicare HMO

## 2016-08-29 ENCOUNTER — Inpatient Hospital Stay: Payer: Medicare HMO

## 2016-08-29 ENCOUNTER — Ambulatory Visit
Admission: RE | Admit: 2016-08-29 | Discharge: 2016-08-29 | Disposition: A | Discharge status: Home / Self Care | Payer: Medicare HMO | Source: Ambulatory Visit | Attending: Radiation Oncology | Admitting: Radiation Oncology

## 2016-08-29 ENCOUNTER — Ambulatory Visit: Payer: Medicare HMO

## 2016-08-29 ENCOUNTER — Ambulatory Visit: Payer: Medicare HMO | Admitting: Hematology and Oncology

## 2016-08-29 DIAGNOSIS — F17210 Nicotine dependence, cigarettes, uncomplicated: Secondary | ICD-10-CM | POA: Diagnosis not present

## 2016-08-29 DIAGNOSIS — Z510 Encounter for antineoplastic radiation therapy: Secondary | ICD-10-CM | POA: Diagnosis not present

## 2016-08-29 DIAGNOSIS — J449 Chronic obstructive pulmonary disease, unspecified: Secondary | ICD-10-CM | POA: Diagnosis not present

## 2016-08-29 DIAGNOSIS — C3432 Malignant neoplasm of lower lobe, left bronchus or lung: Secondary | ICD-10-CM | POA: Diagnosis not present

## 2016-08-29 DIAGNOSIS — I110 Hypertensive heart disease with heart failure: Secondary | ICD-10-CM | POA: Diagnosis not present

## 2016-08-29 DIAGNOSIS — R918 Other nonspecific abnormal finding of lung field: Secondary | ICD-10-CM | POA: Diagnosis not present

## 2016-08-29 DIAGNOSIS — G629 Polyneuropathy, unspecified: Secondary | ICD-10-CM | POA: Diagnosis not present

## 2016-08-29 DIAGNOSIS — I509 Heart failure, unspecified: Secondary | ICD-10-CM | POA: Diagnosis not present

## 2016-08-29 DIAGNOSIS — M797 Fibromyalgia: Secondary | ICD-10-CM | POA: Diagnosis not present

## 2016-08-29 NOTE — Progress Notes (Deleted)
[facility] day:  08/29/2016   Chief Complaint: [name] [name] is a 76 y.o. 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  HPI: The patient was last seen in the medical oncology clinic on 08/11/2016.  At that time, she had a cough unrelieved by Ladona Ridgel.  Chest pain had resolved.  She received week #3 chemotherapy.  She received radiation until 08/15/2016. She restarted radiation on 08/26/2016.   Past Medical History:  Diagnosis Date  . Asthma   . Breast cancer ([place]) 2009   left  . Cancer ([place])   . CHF (congestive heart failure) ([place])   . CHF (congestive heart failure) ([place])   . Chronic UTI   . COPD (chronic obstructive pulmonary disease) ([place])   . Dizziness   . Fibromyalgia   . Hypertension   . Neuropathy   . Personal history of tobacco use, presenting hazards to health 05/17/2015  . Polyp, larynx   . RA (rheumatoid arthritis) ([place])   . Sinus infection    recent  . Stumbling gait    to the left  . Supplemental oxygen dependent    2.5l    Past Surgical History:  Procedure Laterality Date  . ABDOMINAL HYSTERECTOMY    . BREAST LUMPECTOMY Left 2009   chemo and radiation  . CYST EXCISION Left 02/27/2015   Procedure: CYST REMOVAL;  Surgeon: Hessie Knows, MD;  Location: [facility] ORS;  Service: Orthopedics;  Laterality: Left;  . EYE MUSCLE SURGERY Right    13 surgeries  . FLEXIBLE BRONCHOSCOPY N/A 07/01/2016   Procedure: FLEXIBLE BRONCHOSCOPY;  Surgeon: Wilhelmina Mcardle, MD;  Location: [facility] ORS;  Service: Pulmonary;  Laterality: N/A;  . THUMB ARTHROSCOPY Left     Family History  Problem Relation Age of Onset  . Diabetes Father   . Stroke Father   . Heart attack Father   . CAD Sister     Social History:  reports that she has been smoking Cigarettes.  She has a 40.00 pack-year smoking history. She has  never used smokeless tobacco. She reports that she does not drink alcohol or use drugs.  He lives with her son and grandson. The patient is alone today.   Allergies:  Allergies  Allergen Reactions  . Contrast Media [Iodinated Diagnostic Agents] Other (See Comments)    Pt was sent to the ED following contrast media injection at [place]. Unknown reason. She has been premedicated since without complications. Pt to be premedicated prior to contrast media injections  . Ioxaglate Other (See Comments)    Pt was sent to the ED following contrast media injection at [place]. Unknown reason. She has been premedicated since without complications. Pt to be premedicated prior to contrast media injections  . Sulfa Antibiotics     Other reaction(s): Other (See Comments)  . Tetracycline Hives and Other (See Comments)  . White Petrolatum Other (See Comments)  . Amoxicillin-Pot Clavulanate Rash    Other reaction(s): Unknown Other reaction(s): Unknown _0  Other reaction(s): Unknown Blisters in mouth Blisters in mouth  . Tape Rash    Current Medications: Current Outpatient Prescriptions  Medication Sig Dispense Refill  . benzonatate (TESSALON) 200 MG capsule Take 1 capsule (200 mg total) by mouth 3 (three) times daily as needed for  cough. 20 capsule 0  . Calcium-Vitamin D 600-200 MG-UNIT tablet Take 1 tablet by mouth daily.     . carvedilol (COREG) 12.5 MG tablet Take 1 tablet (12.5 mg total) by mouth 2 (two) times daily with a meal. 60 tablet 0  . cholecalciferol (VITAMIN D) 1000 units tablet Take 1,000 Units by mouth daily.    . clonazePAM (KLONOPIN) 1 MG tablet Take 1 mg by mouth 2 (two) times daily as needed for anxiety.     . gabapentin (NEURONTIN) 600 MG tablet Take 600 mg by mouth 3 (three) times daily.     [name] guaiFENesin-codeine 100-10 MG/5ML syrup Take 5 mLs by mouth 3 (three) times  daily as needed for cough. 120 mL 0  . HYDROcodone-acetaminophen (NORCO/VICODIN) 5-325 MG tablet Take 1-2 tablets by mouth every 4 (four) hours as needed for moderate pain.     [name] imipramine (TOFRANIL) 25 MG tablet Take 1 tablet by mouth at bedtime.     . iron polysaccharides (FERREX 150) 150 MG capsule Take 150 mg by mouth daily.    [name] LORazepam (ATIVAN) 0.5 MG tablet Take 1 tablet (0.5 mg total) by mouth every 6 (six) hours as needed (Nausea or vomiting). 30 tablet 0  . Multiple Vitamin (MULTIVITAMIN) capsule Take 1 capsule by mouth daily.    . nitrofurantoin, macrocrystal-monohydrate, (MACROBID) 100 MG capsule Take 100 mg by mouth 2 (two) times daily.    . ondansetron (ZOFRAN) 8 MG tablet Take 1 tablet (8 mg total) by mouth 2 (two) times daily as needed for refractory nausea / vomiting. Start on day 3 after chemo. 30 tablet 1  . predniSONE (DELTASONE) 20 MG tablet Take 2 tablets (40 mg total) by mouth daily with breakfast. 10 days 20 tablet 0  . sucralfate (CARAFATE) 1 g tablet Take 1 tablet (1 g total) by mouth 3 (three) times daily. 90 tablet 6  . Tiotropium Bromide Monohydrate (SPIRIVA RESPIMAT) 2.5 MCG/ACT AERS Inhale 2.5 mcg into the lungs 2 (two) times daily. (Patient taking differently: Inhale 2.5 mcg into the lungs daily. ) 1 Inhaler 0  . traMADol (ULTRAM) 50 MG tablet Take 50 mg by mouth daily.     . vitamin C (ASCORBIC ACID) 500 MG tablet Take 500 mg by mouth daily.    [name] ZINC SULFATE PO Take 1 tablet by mouth daily.     [name] zolpidem (AMBIEN) 5 MG tablet Take 5 mg by mouth at bedtime.     No current facility-administered medications for this visit.     Review of Systems:  GENERAL:  "Don't feel like smiling".  No fevers or sweats.  Weight down 1 pound. PERFORMANCE STATUS (ECOG):  2 HEENT:  No visual changes, runny nose, sore throat, mouth sores or tenderness. Lungs: Shortness of breath (stable).  Cough.  No hemoptysis.  On oxygen 2 liter/min visa [place]. Cardiac:  Mid sternal chest pain,  resolved.  No palpitations, orthopnea, or PND. GI:  No nausea, vomiting, diarrhea, constipation, melena or hematochezia. GU:  No urgency, frequency, dysuria, or hematuria. Recent UTI. Musculoskeletal:  Fibromyalgia.  Osteoporosis.  No back pain.  No joint pain.  No muscle tenderness. Extremities:  No pain or swelling. Skin:  Decubitus.  No rashes or skin changes. Neuro:  No headache, numbness or weakness, balance or coordination issues. Endocrine:  No diabetes, thyroid issues, hot flashes or night sweats. Psych:  No mood changes, depression or anxiety. Pain:  No focal pain. Review of systems:  All other systems reviewed and found  to be negative.  Physical Exam: There were no vitals taken for this visit. O2 sats 95%. GENERAL:  Thin woman sitting comfortably in a wheelchair in the exam room in no acute distress. MENTAL STATUS:  Alert and oriented to person, place and time. HEAD:  Short blonde hair.  Normocephalic, atraumatic, face symmetric, no Cushingoid features. EYES:  Blue eyes.  No conjunctivitis or scleral icterus. ENT:  Oropharynx clear without lesion.  Tongue normal. Mucous membranes moist.  RESPIRATORY:  Clear to auscultation.  No rales, wheezes or rhonchi. CARDIOVASCULAR:  Regular rate and rhythm without murmur, rub or gallop. SKIN:  No rashes, ulcers or lesions. EXTREMITIES: Arthritic changes in hands.  Tender to palpation.  No edema, no skin discoloration or tenderness.  No palpable cords. LYMPH NODES: No palpable cervical, supraclavicular, axillary or inguinal adenopathy  NEUROLOGICAL: Alert and interactive.  Unremarkable. PSYCH:  Appropriate.   No visits with results within 3 Day(s) from this visit.  Latest known visit with results is:  Orders Only on 08/11/2016  Component Date Value Ref Range Status  . WBC 08/11/2016 5.3  3.6 - 11.0 K/uL Final  . RBC 08/11/2016 3.42* 3.80 - 5.20 MIL/uL Final  . Hemoglobin 08/11/2016 10.3* 12.0 - 16.0 g/dL Final  . HCT 08/11/2016 29.9*  35.0 - 47.0 % Final  . MCV 08/11/2016 87.4  80.0 - 100.0 fL Final  . MCH 08/11/2016 30.1  26.0 - 34.0 pg Final  . MCHC 08/11/2016 34.5  32.0 - 36.0 g/dL Final  . RDW 08/11/2016 16.3* 11.5 - 14.5 % Final  . Platelets 08/11/2016 294  150 - 440 K/uL Final  . Neutrophils Relative % 08/11/2016 72  % Final  . Neutro Abs 08/11/2016 3.8  1.4 - 6.5 K/uL Final  . Lymphocytes Relative 08/11/2016 13  % Final  . Lymphs Abs 08/11/2016 0.7* 1.0 - 3.6 K/uL Final  . Monocytes Relative 08/11/2016 11  % Final  . Monocytes Absolute 08/11/2016 0.6  0.2 - 0.9 K/uL Final  . Eosinophils Relative 08/11/2016 3  % Final  . Eosinophils Absolute 08/11/2016 0.1  0 - 0.7 K/uL Final  . Basophils Relative 08/11/2016 1  % Final  . Basophils Absolute 08/11/2016 0.0  0 - 0.1 K/uL Final  . Sodium 08/11/2016 133* 135 - 145 mmol/L Final  . Potassium 08/11/2016 3.9  3.5 - 5.1 mmol/L Final  . Chloride 08/11/2016 97* 101 - 111 mmol/L Final  . CO2 08/11/2016 27  22 - 32 mmol/L Final  . Glucose, Bld 08/11/2016 135* 65 - 99 mg/dL Final  . BUN 08/11/2016 15  6 - 20 mg/dL Final  . Creatinine, Ser 08/11/2016 1.23* 0.44 - 1.00 mg/dL Final  . Calcium 08/11/2016 9.0  8.9 - 10.3 mg/dL Final  . Total Protein 08/11/2016 6.6  6.5 - 8.1 g/dL Final  . Albumin 08/11/2016 2.9* 3.5 - 5.0 g/dL Final  . AST 08/11/2016 18  15 - 41 U/L Final  . ALT 08/11/2016 9* 14 - 54 U/L Final  . Alkaline Phosphatase 08/11/2016 133* 38 - 126 U/L Final  . Total Bilirubin 08/11/2016 0.3  0.3 - 1.2 mg/dL Final  . GFR calc non Af Amer 08/11/2016 42* >60 mL/min Final  . GFR calc Af Amer 08/11/2016 49* >60 mL/min Final   Comment: (NOTE) The eGFR has been calculated using the CKD EPI equation. This calculation has not been validated in all clinical situations. eGFR's persistently <60 mL/min signify possible Chronic Kidney Disease.   . Anion gap 08/11/2016 9  5 -  15 Final  . Magnesium 08/11/2016 1.9  1.7 - 2.4 mg/dL Final    Assessment:  MYKAYLA BRINTON is a  76 y.o. female with a clinical [id number] sqamous cell lung cancer of the left lung s/p bronchoscopy and biopsy on 07/01/2016.  She has a 40 pack year smoking history.  She presented with left lower chest wall pain.  Chest CT with contrast on 06/11/2016 revealed a 3.2 x 3.0 mass like area of focal opacity in the medial left lower lobe with obliteration of segmental airways to the anterior left lower lobe.  PET scan on 06/27/2016 revealed a 4.3 x 3.2 cm central left lower lobe lung lesion (SUV 14.3) consistent with primary bronchogenic carcinoma.  There was equivocal nodal tissue in the subcarinal station demonstrating mild hypermetabolism (SUV 3.6). There was more peripheral left lower lobe increased atelectasis with mucoid impaction which is likely secondary to endobronchial obstruction.  There was a small left pleural effusion.  Head MRI on 07/07/2016 revealed no evidence of metastatic disease.  She began radiation on 07/22/2016.  She is s/p week #3 concurrent carboplatin and Taxol (07/24/2016 - 08/11/2016). She requires a reduced dose of Benadryl (25 mg) for her premedication.  Week #3 was postponed secondary to chest pain and evaluation.  She has a normocytic anemia likely due to chemotherapy.  Work-up on 07/24/2016 revealed the following normal labs: ferritin (103), B12 (345), folate(19.7).  Iron saturation was 8% and TIBC was 258.  She has a history of stage IA left breast cancer in 02/2005.  She underwent lumpectomy.  Pathology revealed a T1cN0 lesion.  Tumor was ER + , PR +, and Her2/neu -.  Two sentinel lymph nodes were negative.  She received chemotherapy (4 cycles of AC and possibly an abbreviated course of Taxol- no records available).  She received radiation.  She completed Femara in 08/2010.  She has fibromyalgia.  She has advanced thoracoabdominal aortic atherosclerosis with fusiform dilatation of the descending thoracic aorta up to 4.3 cm.  She is followed by Dr Lucky Cowboy.  Symptomatically,  she has a cough unrelieved by [employer].  Chest pain has resolved.  Exam is stable.   Plan: 1.  Labs today:  CBC with diff, CMP, Mg. 2.  Week #4 carboplatin and Taxol today.  3.  Rx:  Robitussen AC 5 cc po TID prn cough. 4.  RTC in 1 week for MD assessment, labs (CBC with diff, CMP, Mg), and week #4 carboplatin and Taxol.   Lequita Asal, MD  08/29/2016, 4:24 AM

## 2016-08-30 ENCOUNTER — Ambulatory Visit: Payer: Medicare HMO

## 2016-09-01 ENCOUNTER — Ambulatory Visit
Admission: RE | Admit: 2016-09-01 | Discharge: 2016-09-01 | Disposition: A | Discharge status: Home / Self Care | Payer: Medicare HMO | Source: Ambulatory Visit | Attending: Radiation Oncology | Admitting: Radiation Oncology

## 2016-09-01 DIAGNOSIS — M797 Fibromyalgia: Secondary | ICD-10-CM | POA: Diagnosis not present

## 2016-09-01 DIAGNOSIS — M069 Rheumatoid arthritis, unspecified: Secondary | ICD-10-CM | POA: Diagnosis not present

## 2016-09-01 DIAGNOSIS — J45909 Unspecified asthma, uncomplicated: Secondary | ICD-10-CM | POA: Diagnosis not present

## 2016-09-01 DIAGNOSIS — D638 Anemia in other chronic diseases classified elsewhere: Secondary | ICD-10-CM | POA: Diagnosis not present

## 2016-09-01 DIAGNOSIS — I110 Hypertensive heart disease with heart failure: Secondary | ICD-10-CM | POA: Diagnosis not present

## 2016-09-01 DIAGNOSIS — F17210 Nicotine dependence, cigarettes, uncomplicated: Secondary | ICD-10-CM | POA: Diagnosis not present

## 2016-09-01 DIAGNOSIS — I509 Heart failure, unspecified: Secondary | ICD-10-CM | POA: Diagnosis not present

## 2016-09-01 DIAGNOSIS — Z510 Encounter for antineoplastic radiation therapy: Secondary | ICD-10-CM | POA: Diagnosis not present

## 2016-09-01 DIAGNOSIS — J449 Chronic obstructive pulmonary disease, unspecified: Secondary | ICD-10-CM | POA: Diagnosis not present

## 2016-09-01 DIAGNOSIS — G629 Polyneuropathy, unspecified: Secondary | ICD-10-CM | POA: Diagnosis not present

## 2016-09-01 DIAGNOSIS — R918 Other nonspecific abnormal finding of lung field: Secondary | ICD-10-CM | POA: Diagnosis not present

## 2016-09-01 DIAGNOSIS — C3432 Malignant neoplasm of lower lobe, left bronchus or lung: Secondary | ICD-10-CM | POA: Diagnosis not present

## 2016-09-02 ENCOUNTER — Other Ambulatory Visit: Payer: Medicare HMO

## 2016-09-02 ENCOUNTER — Ambulatory Visit: Payer: Medicare HMO | Admitting: Hematology and Oncology

## 2016-09-02 ENCOUNTER — Ambulatory Visit: Payer: Medicare HMO

## 2016-09-02 ENCOUNTER — Ambulatory Visit
Admission: RE | Admit: 2016-09-02 | Discharge: 2016-09-02 | Disposition: A | Discharge status: Home / Self Care | Payer: Medicare HMO | Source: Ambulatory Visit | Attending: Radiation Oncology | Admitting: Radiation Oncology

## 2016-09-02 DIAGNOSIS — J449 Chronic obstructive pulmonary disease, unspecified: Secondary | ICD-10-CM | POA: Diagnosis not present

## 2016-09-02 DIAGNOSIS — G629 Polyneuropathy, unspecified: Secondary | ICD-10-CM | POA: Diagnosis not present

## 2016-09-02 DIAGNOSIS — N3941 Urge incontinence: Secondary | ICD-10-CM | POA: Diagnosis not present

## 2016-09-02 DIAGNOSIS — N3020 Other chronic cystitis without hematuria: Secondary | ICD-10-CM | POA: Diagnosis not present

## 2016-09-02 DIAGNOSIS — M797 Fibromyalgia: Secondary | ICD-10-CM | POA: Diagnosis not present

## 2016-09-02 DIAGNOSIS — I110 Hypertensive heart disease with heart failure: Secondary | ICD-10-CM | POA: Diagnosis not present

## 2016-09-02 DIAGNOSIS — I509 Heart failure, unspecified: Secondary | ICD-10-CM | POA: Diagnosis not present

## 2016-09-02 DIAGNOSIS — C3432 Malignant neoplasm of lower lobe, left bronchus or lung: Secondary | ICD-10-CM | POA: Diagnosis not present

## 2016-09-02 DIAGNOSIS — R350 Frequency of micturition: Secondary | ICD-10-CM | POA: Diagnosis not present

## 2016-09-02 DIAGNOSIS — F17210 Nicotine dependence, cigarettes, uncomplicated: Secondary | ICD-10-CM | POA: Diagnosis not present

## 2016-09-02 DIAGNOSIS — Z510 Encounter for antineoplastic radiation therapy: Secondary | ICD-10-CM | POA: Diagnosis not present

## 2016-09-02 DIAGNOSIS — R918 Other nonspecific abnormal finding of lung field: Secondary | ICD-10-CM | POA: Diagnosis not present

## 2016-09-02 DIAGNOSIS — R339 Retention of urine, unspecified: Secondary | ICD-10-CM | POA: Diagnosis not present

## 2016-09-03 ENCOUNTER — Ambulatory Visit: Payer: Medicare HMO

## 2016-09-04 ENCOUNTER — Ambulatory Visit
Admission: RE | Admit: 2016-09-04 | Discharge: 2016-09-04 | Disposition: A | Discharge status: Home / Self Care | Payer: Medicare HMO | Source: Ambulatory Visit | Attending: Radiation Oncology | Admitting: Radiation Oncology

## 2016-09-04 DIAGNOSIS — R918 Other nonspecific abnormal finding of lung field: Secondary | ICD-10-CM | POA: Diagnosis not present

## 2016-09-04 DIAGNOSIS — Z510 Encounter for antineoplastic radiation therapy: Secondary | ICD-10-CM | POA: Diagnosis not present

## 2016-09-04 DIAGNOSIS — C3432 Malignant neoplasm of lower lobe, left bronchus or lung: Secondary | ICD-10-CM | POA: Diagnosis not present

## 2016-09-04 DIAGNOSIS — I110 Hypertensive heart disease with heart failure: Secondary | ICD-10-CM | POA: Diagnosis not present

## 2016-09-04 DIAGNOSIS — F17210 Nicotine dependence, cigarettes, uncomplicated: Secondary | ICD-10-CM | POA: Diagnosis not present

## 2016-09-04 DIAGNOSIS — J449 Chronic obstructive pulmonary disease, unspecified: Secondary | ICD-10-CM | POA: Diagnosis not present

## 2016-09-04 DIAGNOSIS — M797 Fibromyalgia: Secondary | ICD-10-CM | POA: Diagnosis not present

## 2016-09-04 DIAGNOSIS — G629 Polyneuropathy, unspecified: Secondary | ICD-10-CM | POA: Diagnosis not present

## 2016-09-04 DIAGNOSIS — I509 Heart failure, unspecified: Secondary | ICD-10-CM | POA: Diagnosis not present

## 2016-09-05 ENCOUNTER — Inpatient Hospital Stay: Payer: Medicare HMO

## 2016-09-05 ENCOUNTER — Ambulatory Visit: Payer: Medicare HMO

## 2016-09-05 ENCOUNTER — Inpatient Hospital Stay (HOSPITAL_BASED_OUTPATIENT_CLINIC_OR_DEPARTMENT_OTHER): Payer: Medicare HMO | Admitting: Hematology and Oncology

## 2016-09-05 ENCOUNTER — Inpatient Hospital Stay: Payer: Medicare HMO | Attending: Hematology and Oncology

## 2016-09-05 ENCOUNTER — Ambulatory Visit
Admission: RE | Admit: 2016-09-05 | Discharge: 2016-09-05 | Disposition: A | Discharge status: Home / Self Care | Payer: Medicare HMO | Source: Ambulatory Visit | Attending: Radiation Oncology | Admitting: Radiation Oncology

## 2016-09-05 VITALS — BP 151/77 | HR 84 | Temp 97.3°F | Resp 18 | Wt 115.2 lb

## 2016-09-05 DIAGNOSIS — Z853 Personal history of malignant neoplasm of breast: Secondary | ICD-10-CM | POA: Insufficient documentation

## 2016-09-05 DIAGNOSIS — R600 Localized edema: Secondary | ICD-10-CM | POA: Insufficient documentation

## 2016-09-05 DIAGNOSIS — C3432 Malignant neoplasm of lower lobe, left bronchus or lung: Secondary | ICD-10-CM | POA: Insufficient documentation

## 2016-09-05 DIAGNOSIS — F17210 Nicotine dependence, cigarettes, uncomplicated: Secondary | ICD-10-CM | POA: Diagnosis not present

## 2016-09-05 DIAGNOSIS — D649 Anemia, unspecified: Secondary | ICD-10-CM

## 2016-09-05 DIAGNOSIS — J449 Chronic obstructive pulmonary disease, unspecified: Secondary | ICD-10-CM | POA: Diagnosis not present

## 2016-09-05 DIAGNOSIS — M797 Fibromyalgia: Secondary | ICD-10-CM | POA: Insufficient documentation

## 2016-09-05 DIAGNOSIS — Z881 Allergy status to other antibiotic agents status: Secondary | ICD-10-CM

## 2016-09-05 DIAGNOSIS — Z9223 Personal history of estrogen therapy: Secondary | ICD-10-CM

## 2016-09-05 DIAGNOSIS — Z880 Allergy status to penicillin: Secondary | ICD-10-CM

## 2016-09-05 DIAGNOSIS — Z79899 Other long term (current) drug therapy: Secondary | ICD-10-CM | POA: Diagnosis not present

## 2016-09-05 DIAGNOSIS — Z5111 Encounter for antineoplastic chemotherapy: Secondary | ICD-10-CM | POA: Insufficient documentation

## 2016-09-05 DIAGNOSIS — Z9981 Dependence on supplemental oxygen: Secondary | ICD-10-CM

## 2016-09-05 DIAGNOSIS — Z7952 Long term (current) use of systemic steroids: Secondary | ICD-10-CM | POA: Diagnosis not present

## 2016-09-05 DIAGNOSIS — Z87440 Personal history of urinary (tract) infections: Secondary | ICD-10-CM | POA: Insufficient documentation

## 2016-09-05 DIAGNOSIS — N390 Urinary tract infection, site not specified: Secondary | ICD-10-CM | POA: Insufficient documentation

## 2016-09-05 DIAGNOSIS — Z923 Personal history of irradiation: Secondary | ICD-10-CM | POA: Diagnosis not present

## 2016-09-05 DIAGNOSIS — I509 Heart failure, unspecified: Secondary | ICD-10-CM | POA: Diagnosis not present

## 2016-09-05 DIAGNOSIS — R5383 Other fatigue: Secondary | ICD-10-CM | POA: Diagnosis not present

## 2016-09-05 DIAGNOSIS — I700 Atherosclerosis of aorta: Secondary | ICD-10-CM

## 2016-09-05 DIAGNOSIS — Z170 Estrogen receptor positive status [ER+]: Secondary | ICD-10-CM

## 2016-09-05 DIAGNOSIS — M069 Rheumatoid arthritis, unspecified: Secondary | ICD-10-CM

## 2016-09-05 DIAGNOSIS — G629 Polyneuropathy, unspecified: Secondary | ICD-10-CM | POA: Diagnosis not present

## 2016-09-05 DIAGNOSIS — R059 Cough, unspecified: Secondary | ICD-10-CM

## 2016-09-05 DIAGNOSIS — B9620 Unspecified Escherichia coli [E. coli] as the cause of diseases classified elsewhere: Secondary | ICD-10-CM | POA: Diagnosis not present

## 2016-09-05 DIAGNOSIS — I110 Hypertensive heart disease with heart failure: Secondary | ICD-10-CM | POA: Diagnosis not present

## 2016-09-05 DIAGNOSIS — Z792 Long term (current) use of antibiotics: Secondary | ICD-10-CM | POA: Diagnosis not present

## 2016-09-05 DIAGNOSIS — Z9221 Personal history of antineoplastic chemotherapy: Secondary | ICD-10-CM | POA: Insufficient documentation

## 2016-09-05 DIAGNOSIS — Z510 Encounter for antineoplastic radiation therapy: Secondary | ICD-10-CM | POA: Diagnosis not present

## 2016-09-05 DIAGNOSIS — R05 Cough: Secondary | ICD-10-CM

## 2016-09-05 DIAGNOSIS — R918 Other nonspecific abnormal finding of lung field: Secondary | ICD-10-CM | POA: Diagnosis not present

## 2016-09-05 LAB — COMPREHENSIVE METABOLIC PANEL
ALT: 9 U/L — ABNORMAL LOW (ref 14–54)
AST: 19 U/L (ref 15–41)
Albumin: 3.1 g/dL — ABNORMAL LOW (ref 3.5–5.0)
Alkaline Phosphatase: 126 U/L (ref 38–126)
Anion gap: 10 (ref 5–15)
BUN: 9 mg/dL (ref 6–20)
CO2: 26 mmol/L (ref 22–32)
Calcium: 8.9 mg/dL (ref 8.9–10.3)
Chloride: 98 mmol/L — ABNORMAL LOW (ref 101–111)
Creatinine, Ser: 1.2 mg/dL — ABNORMAL HIGH (ref 0.44–1.00)
GFR calc Af Amer: 51 mL/min — ABNORMAL LOW (ref >60)
GFR calc non Af Amer: 44 mL/min — ABNORMAL LOW (ref >60)
Glucose, Bld: 102 mg/dL — ABNORMAL HIGH (ref 65–99)
Potassium: 4.5 mmol/L (ref 3.5–5.1)
Sodium: 134 mmol/L — ABNORMAL LOW (ref 135–145)
Total Bilirubin: 0.6 mg/dL (ref 0.3–1.2)
Total Protein: 6.8 g/dL (ref 6.5–8.1)

## 2016-09-05 LAB — CBC WITH DIFFERENTIAL/PLATELET
Basophils Absolute: 0 10*3/uL (ref 0–0.1)
Basophils Relative: 0 %
Eosinophils Absolute: 0.2 10*3/uL (ref 0–0.7)
Eosinophils Relative: 5 %
HCT: 31.9 % — ABNORMAL LOW (ref 35.0–47.0)
Hemoglobin: 11 g/dL — ABNORMAL LOW (ref 12.0–16.0)
Lymphocytes Relative: 19 %
Lymphs Abs: 0.7 10*3/uL — ABNORMAL LOW (ref 1.0–3.6)
MCH: 30.3 pg (ref 26.0–34.0)
MCHC: 34.3 g/dL (ref 32.0–36.0)
MCV: 88.3 fL (ref 80.0–100.0)
Monocytes Absolute: 0.4 10*3/uL (ref 0.2–0.9)
Monocytes Relative: 9 %
Neutro Abs: 2.6 10*3/uL (ref 1.4–6.5)
Neutrophils Relative %: 67 %
Platelets: 259 10*3/uL (ref 150–440)
RBC: 3.62 MIL/uL — ABNORMAL LOW (ref 3.80–5.20)
RDW: 18.4 % — ABNORMAL HIGH (ref 11.5–14.5)
WBC: 3.9 10*3/uL (ref 3.6–11.0)

## 2016-09-05 LAB — MAGNESIUM: Magnesium: 2.1 mg/dL (ref 1.7–2.4)

## 2016-09-05 MED ORDER — FAMOTIDINE IN NACL 20-0.9 MG/50ML-% IV SOLN
20.0000 mg | Freq: Once | INTRAVENOUS | Status: AC
  Administered 2016-09-05: 20 mg via INTRAVENOUS
  Filled 2016-09-05: qty 50

## 2016-09-05 MED ORDER — SODIUM CHLORIDE 0.9 % IV SOLN
110.0000 mg | Freq: Once | INTRAVENOUS | Status: AC
  Administered 2016-09-05: 110 mg via INTRAVENOUS
  Filled 2016-09-05: qty 11

## 2016-09-05 MED ORDER — SODIUM CHLORIDE 0.9 % IV SOLN
20.0000 mg | Freq: Once | INTRAVENOUS | Status: AC
  Administered 2016-09-05: 20 mg via INTRAVENOUS
  Filled 2016-09-05: qty 2

## 2016-09-05 MED ORDER — SODIUM CHLORIDE 0.9 % IV SOLN
Freq: Once | INTRAVENOUS | Status: AC
  Administered 2016-09-05: 11:00:00 via INTRAVENOUS
  Filled 2016-09-05: qty 1000

## 2016-09-05 MED ORDER — PACLITAXEL CHEMO INJECTION 300 MG/50ML
45.0000 mg/m2 | Freq: Once | INTRAVENOUS | Status: AC
  Administered 2016-09-05: 66 mg via INTRAVENOUS
  Filled 2016-09-05: qty 11

## 2016-09-05 MED ORDER — DIPHENHYDRAMINE HCL 50 MG/ML IJ SOLN
25.0000 mg | Freq: Once | INTRAMUSCULAR | Status: AC
  Administered 2016-09-05: 25 mg via INTRAVENOUS
  Filled 2016-09-05: qty 1

## 2016-09-05 MED ORDER — PALONOSETRON HCL INJECTION 0.25 MG/5ML
0.2500 mg | Freq: Once | INTRAVENOUS | Status: AC
  Administered 2016-09-05: 0.25 mg via INTRAVENOUS
  Filled 2016-09-05: qty 5

## 2016-09-05 NOTE — Progress Notes (Signed)
Patient states she is feeling very fatigued and sleepy.  States her appetite is decreased.  Further states she has had bilateral lower extremity edema for past 2 weeks so much so that she has had to cut her socks off.

## 2016-09-05 NOTE — Progress Notes (Signed)
[facility] day:  09/05/2016   Chief Complaint: [name] [name] is a 76 y.o. 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  HPI: The patient was last seen in the medical oncology clinic on 08/11/2016.  At that time, she had a cough unrelieved by Ladona Ridgel.  Chest pain had resolved.  She received week #3 chemotherapy.  She received radiation until 08/15/2016.  Radiation restarted on 08/26/2016.  She notes an interval UTI treated by Dr Jacqlyn Larsen.  She had some diarrhea, now resolved.  She has chronic shortness of breath.  She feels "tired".  She notes a little swelling in her legs.  She believes radiation will end on 09/16/2016.   Past Medical History:  Diagnosis Date  . Asthma   . Breast cancer ([place]) 2009   left  . Cancer ([place])   . CHF (congestive heart failure) ([place])   . CHF (congestive heart failure) ([place])   . Chronic UTI   . COPD (chronic obstructive pulmonary disease) ([place])   . Dizziness   . Fibromyalgia   . Hypertension   . Neuropathy   . Personal history of tobacco use, presenting hazards to health 05/17/2015  . Polyp, larynx   . RA (rheumatoid arthritis) ([place])   . Sinus infection    recent  . Stumbling gait    to the left  . Supplemental oxygen dependent    2.5l    Past Surgical History:  Procedure Laterality Date  . ABDOMINAL HYSTERECTOMY    . BREAST LUMPECTOMY Left 2009   chemo and radiation  . CYST EXCISION Left 02/27/2015   Procedure: CYST REMOVAL;  Surgeon: Hessie Knows, MD;  Location: [facility] ORS;  Service: Orthopedics;  Laterality: Left;  . EYE MUSCLE SURGERY Right    13 surgeries  . FLEXIBLE BRONCHOSCOPY N/A 07/01/2016   Procedure: FLEXIBLE BRONCHOSCOPY;  Surgeon: Wilhelmina Mcardle, MD;  Location: [facility] ORS;  Service: Pulmonary;  Laterality: N/A;  . THUMB ARTHROSCOPY Left     Family History   Problem Relation Age of Onset  . Diabetes Father   . Stroke Father   . Heart attack Father   . CAD Sister     Social History:  reports that she has been smoking Cigarettes.  She has a 40.00 pack-year smoking history. She has never used smokeless tobacco. She reports that she does not drink alcohol or use drugs.  He lives with her son and grandson. The patient is alone today.   Allergies:  Allergies  Allergen Reactions  . Contrast Media [Iodinated Diagnostic Agents] Other (See Comments)    Pt was sent to the ED following contrast media injection at [facility]. Unknown reason. She has been premedicated since without complications. Pt to be premedicated prior to contrast media injections  . Ioxaglate Other (See Comments)    Pt was sent to the ED following contrast media injection at [facility]. Unknown reason. She has been premedicated since without complications. Pt to be premedicated prior to contrast media injections  . Sulfa Antibiotics     Other reaction(s): Other (See Comments)  . Tetracycline Hives and Other (See Comments)  . White Petrolatum Other (See Comments)  . Amoxicillin-Pot Clavulanate Rash    Other reaction(s): Unknown Other reaction(s): Unknown _0  Other reaction(s): Unknown  Blisters in mouth Blisters in mouth  . Tape Rash    Current Medications: Current Outpatient Prescriptions  Medication Sig Dispense Refill  . benzonatate (TESSALON) 200 MG capsule Take 1 capsule (200 mg total) by mouth 3 (three) times daily as needed for cough. 20 capsule 0  . Calcium-Vitamin D 600-200 MG-UNIT tablet Take 1 tablet by mouth daily.     . carvedilol (COREG) 12.5 MG tablet Take 1 tablet (12.5 mg total) by mouth 2 (two) times daily with a meal. 60 tablet 0  . cholecalciferol (VITAMIN D) 1000 units tablet Take 1,000 Units by mouth daily.    . clonazePAM (KLONOPIN) 1 MG tablet  Take 1 mg by mouth 2 (two) times daily as needed for anxiety.     . gabapentin (NEURONTIN) 600 MG tablet Take 600 mg by mouth 3 (three) times daily.     [name] guaiFENesin-codeine 100-10 MG/5ML syrup Take 5 mLs by mouth 3 (three) times daily as needed for cough. 120 mL 0  . HYDROcodone-acetaminophen (NORCO/VICODIN) 5-325 MG tablet Take 1-2 tablets by mouth every 4 (four) hours as needed for moderate pain.     [name] imipramine (TOFRANIL) 25 MG tablet Take 1 tablet by mouth at bedtime.     . iron polysaccharides (FERREX 150) 150 MG capsule Take 150 mg by mouth daily.    [name] LORazepam (ATIVAN) 0.5 MG tablet Take 1 tablet (0.5 mg total) by mouth every 6 (six) hours as needed (Nausea or vomiting). 30 tablet 0  . MONUROL 3 g PACK Take 3 g by mouth one time only at 6 PM.    . Multiple Vitamin (MULTIVITAMIN) capsule Take 1 capsule by mouth daily.    . nitrofurantoin, macrocrystal-monohydrate, (MACROBID) 100 MG capsule Take 100 mg by mouth 2 (two) times daily.    . ondansetron (ZOFRAN) 8 MG tablet Take 1 tablet (8 mg total) by mouth 2 (two) times daily as needed for refractory nausea / vomiting. Start on day 3 after chemo. 30 tablet 1  . predniSONE (DELTASONE) 20 MG tablet Take 2 tablets (40 mg total) by mouth daily with breakfast. 10 days 20 tablet 0  . sucralfate (CARAFATE) 1 g tablet Take 1 tablet (1 g total) by mouth 3 (three) times daily. 90 tablet 6  . Tiotropium Bromide Monohydrate (SPIRIVA RESPIMAT) 2.5 MCG/ACT AERS Inhale 2.5 mcg into the lungs 2 (two) times daily. (Patient taking differently: Inhale 2.5 mcg into the lungs daily. ) 1 Inhaler 0  . traMADol (ULTRAM) 50 MG tablet Take 50 mg by mouth daily.     . vitamin C (ASCORBIC ACID) 500 MG tablet Take 500 mg by mouth daily.    [name] ZINC SULFATE PO Take 1 tablet by mouth daily.     [name] zolpidem (AMBIEN) 5 MG tablet Take 5 mg by mouth at bedtime.     No current facility-administered medications for this visit.     Review of Systems:  GENERAL:  "Just doing".   Tired.  No fevers or sweats.  Weight up 1 pound. PERFORMANCE STATUS (ECOG):  2 HEENT:  No visual changes, runny nose, sore throat, mouth sores or tenderness. Lungs: Shortness of breath (stable).  Cough.  No hemoptysis.  On oxygen 2 liter/min visa [place]. Cardiac:  No chest pain, palpitations, orthopnea, or PND. GI:  No nausea, vomiting, diarrhea, constipation, melena or hematochezia. GU:  No urgency, frequency, dysuria, or hematuria. Recent UTI. Musculoskeletal:  Fibromyalgia.  Osteoporosis.  No back pain.  No joint pain.  No muscle tenderness.  Extremities:  No pain or swelling. Skin:  No rashes or skin changes. Neuro:  No headache, numbness or weakness, balance or coordination issues. Endocrine:  No diabetes, thyroid issues, hot flashes or night sweats. Psych:  No mood changes, depression or anxiety. Pain:  No focal pain. Review of systems:  All other systems reviewed and found to be negative.  Physical Exam: Blood pressure (!) 151/77, pulse 84, temperature (!) 97.3 F (36.3 C), temperature source Tympanic, resp. rate 18, weight 115 lb 3 oz (52.2 kg). O2 sats 95%. GENERAL:  Thin woman sitting comfortably in a wheelchair in the exam room in no acute distress. MENTAL STATUS:  Alert and oriented to person, place and time. HEAD:  Short blonde hair.  Normocephalic, atraumatic, face symmetric, no Cushingoid features. EYES:  Blue eyes.  Pupils equal round and reactive to light and accomodation.  No conjunctivitis or scleral icterus. ENT:  Oropharynx clear without lesion.  Tongue normal. Mucous membranes moist.  RESPIRATORY:  Clear to auscultation.  No rales, wheezes or rhonchi. CARDIOVASCULAR:  Regular rate and rhythm without murmur, rub or gallop. ABDOMEN:  Soft, non tender with active bowel sounds and no hepatosplenomegaly.  No masses. SKIN:  No rashes, ulcers or lesions. EXTREMITIES: Arthritic changes in hands.  Ankle edema.  No skin discoloration or tenderness.  No palpable cords. LYMPH NODES:  No palpable cervical, supraclavicular, axillary or inguinal adenopathy  NEUROLOGICAL: Alert and interactive.  Unremarkable. PSYCH:  Appropriate.   Appointment on 09/05/2016  Component Date Value Ref Range Status  . WBC 09/05/2016 3.9  3.6 - 11.0 K/uL Final  . RBC 09/05/2016 3.62* 3.80 - 5.20 MIL/uL Final  . Hemoglobin 09/05/2016 11.0* 12.0 - 16.0 g/dL Final  . HCT 09/05/2016 31.9* 35.0 - 47.0 % Final  . MCV 09/05/2016 88.3  80.0 - 100.0 fL Final  . MCH 09/05/2016 30.3  26.0 - 34.0 pg Final  . MCHC 09/05/2016 34.3  32.0 - 36.0 g/dL Final  . RDW 09/05/2016 18.4* 11.5 - 14.5 % Final  . Platelets 09/05/2016 259  150 - 440 K/uL Final  . Neutrophils Relative % 09/05/2016 67  % Final  . Neutro Abs 09/05/2016 2.6  1.4 - 6.5 K/uL Final  . Lymphocytes Relative 09/05/2016 19  % Final  . Lymphs Abs 09/05/2016 0.7* 1.0 - 3.6 K/uL Final  . Monocytes Relative 09/05/2016 9  % Final  . Monocytes Absolute 09/05/2016 0.4  0.2 - 0.9 K/uL Final  . Eosinophils Relative 09/05/2016 5  % Final  . Eosinophils Absolute 09/05/2016 0.2  0 - 0.7 K/uL Final  . Basophils Relative 09/05/2016 0  % Final  . Basophils Absolute 09/05/2016 0.0  0 - 0.1 K/uL Final  . Sodium 09/05/2016 134* 135 - 145 mmol/L Final  . Potassium 09/05/2016 4.5  3.5 - 5.1 mmol/L Final  . Chloride 09/05/2016 98* 101 - 111 mmol/L Final  . CO2 09/05/2016 26  22 - 32 mmol/L Final  . Glucose, Bld 09/05/2016 102* 65 - 99 mg/dL Final  . BUN 09/05/2016 9  6 - 20 mg/dL Final  . Creatinine, Ser 09/05/2016 1.20* 0.44 - 1.00 mg/dL Final  . Calcium 09/05/2016 8.9  8.9 - 10.3 mg/dL Final  . Total Protein 09/05/2016 6.8  6.5 - 8.1 g/dL Final  . Albumin 09/05/2016 3.1* 3.5 - 5.0 g/dL Final  . AST 09/05/2016 19  15 - 41 U/L Final  . ALT 09/05/2016 9* 14 - 54 U/L Final  . Alkaline Phosphatase 09/05/2016 126  38 - 126  U/L Final  . Total Bilirubin 09/05/2016 0.6  0.3 - 1.2 mg/dL Final  . GFR calc non Af Amer 09/05/2016 44* >60 mL/min Final  . GFR calc Af  Amer 09/05/2016 51* >60 mL/min Final   Comment: (NOTE) The eGFR has been calculated using the CKD EPI equation. This calculation has not been validated in all clinical situations. eGFR's persistently <60 mL/min signify possible Chronic Kidney Disease.   . Anion gap 09/05/2016 10  5 - 15 Final  . Magnesium 09/05/2016 2.1  1.7 - 2.4 mg/dL Final    Assessment:  [name] [name] is a 76 y.o. female with a clinical T2bNxM0 sqamous cell lung cancer of the left lung s/p bronchoscopy and biopsy on 07/01/2016.  She has a 40 pack year smoking history.  She presented with left lower chest wall pain.  Chest CT with contrast on 06/11/2016 revealed a 3.2 x 3.0 mass like area of focal opacity in the medial left lower lobe with obliteration of segmental airways to the anterior left lower lobe.  PET scan on 06/27/2016 revealed a 4.3 x 3.2 cm central left lower lobe lung lesion (SUV 14.3) consistent with primary bronchogenic carcinoma.  There was equivocal nodal tissue in the subcarinal station demonstrating mild hypermetabolism (SUV 3.6). There was more peripheral left lower lobe increased atelectasis with mucoid impaction which is likely secondary to endobronchial obstruction.  There was a small left pleural effusion.  Head MRI on 07/07/2016 revealed no evidence of metastatic disease.  She began radiation on 07/22/2016.  She missed a few days.  She is s/p week #3 concurrent carboplatin and Taxol (07/24/2016 - 08/11/2016). She requires a reduced dose of Benadryl (25 mg) for her premedication.  Week #3 was postponed secondary to chest pain and evaluation.  She has a normocytic anemia likely due to chemotherapy.  Work-up on 07/24/2016 revealed the following normal labs: ferritin (103), B12 (345), folate(19.7).  Iron saturation was 8% and TIBC was 258.  She has a history of stage IA left breast cancer in 02/2005.  She underwent lumpectomy.  Pathology revealed a T1cN0 lesion.  Tumor was ER + , PR +, and Her2/neu -.   Two sentinel lymph nodes were negative.  She received chemotherapy (4 cycles of AC and possibly an abbreviated course of Taxol- no records available).  She received radiation.  She completed Femara in 08/2010.  She has fibromyalgia.  She has advanced thoracoabdominal aortic atherosclerosis with fusiform dilatation of the descending thoracic aorta up to 4.3 cm.  She is followed by Dr Lucky Cowboy.  Symptomatically, she feels tired.  She has chronic shortness of breath.  Exam is stable.   Plan: 1.  Labs today:  CBC with diff, CMP, Mg. 2.  Week #4 carboplatin and Taxol today. 3.  RTC in 1 week for MD assessment, labs (CBC with diff, CMP, Mg), and week #5 carboplatin and Taxol.   Lequita Asal, MD  09/05/2016, 10:00 AM

## 2016-09-06 ENCOUNTER — Encounter: Payer: Self-pay | Admitting: Hematology and Oncology

## 2016-09-08 ENCOUNTER — Ambulatory Visit: Payer: Medicare HMO

## 2016-09-08 ENCOUNTER — Ambulatory Visit
Admission: RE | Admit: 2016-09-08 | Discharge: 2016-09-08 | Disposition: A | Discharge status: Home / Self Care | Payer: Medicare HMO | Source: Ambulatory Visit | Attending: Radiation Oncology | Admitting: Radiation Oncology

## 2016-09-08 DIAGNOSIS — M150 Primary generalized (osteo)arthritis: Secondary | ICD-10-CM | POA: Diagnosis not present

## 2016-09-08 DIAGNOSIS — I110 Hypertensive heart disease with heart failure: Secondary | ICD-10-CM | POA: Diagnosis not present

## 2016-09-08 DIAGNOSIS — G629 Polyneuropathy, unspecified: Secondary | ICD-10-CM | POA: Diagnosis not present

## 2016-09-08 DIAGNOSIS — N390 Urinary tract infection, site not specified: Secondary | ICD-10-CM | POA: Insufficient documentation

## 2016-09-08 DIAGNOSIS — D638 Anemia in other chronic diseases classified elsewhere: Secondary | ICD-10-CM | POA: Diagnosis not present

## 2016-09-08 DIAGNOSIS — R918 Other nonspecific abnormal finding of lung field: Secondary | ICD-10-CM | POA: Diagnosis not present

## 2016-09-08 DIAGNOSIS — I2510 Atherosclerotic heart disease of native coronary artery without angina pectoris: Secondary | ICD-10-CM | POA: Diagnosis not present

## 2016-09-08 DIAGNOSIS — M069 Rheumatoid arthritis, unspecified: Secondary | ICD-10-CM | POA: Diagnosis not present

## 2016-09-08 DIAGNOSIS — F17210 Nicotine dependence, cigarettes, uncomplicated: Secondary | ICD-10-CM | POA: Diagnosis not present

## 2016-09-08 DIAGNOSIS — I509 Heart failure, unspecified: Secondary | ICD-10-CM | POA: Diagnosis not present

## 2016-09-08 DIAGNOSIS — G894 Chronic pain syndrome: Secondary | ICD-10-CM | POA: Diagnosis not present

## 2016-09-08 DIAGNOSIS — J45909 Unspecified asthma, uncomplicated: Secondary | ICD-10-CM | POA: Diagnosis not present

## 2016-09-08 DIAGNOSIS — I712 Thoracic aortic aneurysm, without rupture: Secondary | ICD-10-CM | POA: Diagnosis not present

## 2016-09-08 DIAGNOSIS — J449 Chronic obstructive pulmonary disease, unspecified: Secondary | ICD-10-CM | POA: Diagnosis not present

## 2016-09-08 DIAGNOSIS — C3432 Malignant neoplasm of lower lobe, left bronchus or lung: Secondary | ICD-10-CM | POA: Diagnosis not present

## 2016-09-08 DIAGNOSIS — M797 Fibromyalgia: Secondary | ICD-10-CM | POA: Diagnosis not present

## 2016-09-08 DIAGNOSIS — C801 Malignant (primary) neoplasm, unspecified: Secondary | ICD-10-CM | POA: Diagnosis not present

## 2016-09-08 DIAGNOSIS — Z510 Encounter for antineoplastic radiation therapy: Secondary | ICD-10-CM | POA: Diagnosis not present

## 2016-09-09 ENCOUNTER — Ambulatory Visit: Payer: Medicare HMO

## 2016-09-09 ENCOUNTER — Ambulatory Visit
Admission: RE | Admit: 2016-09-09 | Discharge: 2016-09-09 | Disposition: A | Discharge status: Home / Self Care | Payer: Medicare HMO | Source: Ambulatory Visit | Attending: Radiation Oncology | Admitting: Radiation Oncology

## 2016-09-09 DIAGNOSIS — M797 Fibromyalgia: Secondary | ICD-10-CM | POA: Diagnosis not present

## 2016-09-09 DIAGNOSIS — F17210 Nicotine dependence, cigarettes, uncomplicated: Secondary | ICD-10-CM | POA: Diagnosis not present

## 2016-09-09 DIAGNOSIS — J449 Chronic obstructive pulmonary disease, unspecified: Secondary | ICD-10-CM | POA: Diagnosis not present

## 2016-09-09 DIAGNOSIS — G629 Polyneuropathy, unspecified: Secondary | ICD-10-CM | POA: Diagnosis not present

## 2016-09-09 DIAGNOSIS — Z510 Encounter for antineoplastic radiation therapy: Secondary | ICD-10-CM | POA: Diagnosis not present

## 2016-09-09 DIAGNOSIS — C3432 Malignant neoplasm of lower lobe, left bronchus or lung: Secondary | ICD-10-CM | POA: Diagnosis not present

## 2016-09-09 DIAGNOSIS — R918 Other nonspecific abnormal finding of lung field: Secondary | ICD-10-CM | POA: Diagnosis not present

## 2016-09-09 DIAGNOSIS — I509 Heart failure, unspecified: Secondary | ICD-10-CM | POA: Diagnosis not present

## 2016-09-09 DIAGNOSIS — I110 Hypertensive heart disease with heart failure: Secondary | ICD-10-CM | POA: Diagnosis not present

## 2016-09-10 ENCOUNTER — Ambulatory Visit: Payer: Medicare HMO

## 2016-09-10 ENCOUNTER — Ambulatory Visit
Admission: RE | Admit: 2016-09-10 | Discharge: 2016-09-10 | Disposition: A | Discharge status: Home / Self Care | Payer: Medicare HMO | Source: Ambulatory Visit | Attending: Radiation Oncology | Admitting: Radiation Oncology

## 2016-09-10 DIAGNOSIS — J449 Chronic obstructive pulmonary disease, unspecified: Secondary | ICD-10-CM | POA: Diagnosis not present

## 2016-09-10 DIAGNOSIS — Z510 Encounter for antineoplastic radiation therapy: Secondary | ICD-10-CM | POA: Diagnosis not present

## 2016-09-10 DIAGNOSIS — R918 Other nonspecific abnormal finding of lung field: Secondary | ICD-10-CM | POA: Diagnosis not present

## 2016-09-10 DIAGNOSIS — G629 Polyneuropathy, unspecified: Secondary | ICD-10-CM | POA: Diagnosis not present

## 2016-09-10 DIAGNOSIS — I509 Heart failure, unspecified: Secondary | ICD-10-CM | POA: Diagnosis not present

## 2016-09-10 DIAGNOSIS — I110 Hypertensive heart disease with heart failure: Secondary | ICD-10-CM | POA: Diagnosis not present

## 2016-09-10 DIAGNOSIS — M797 Fibromyalgia: Secondary | ICD-10-CM | POA: Diagnosis not present

## 2016-09-10 DIAGNOSIS — F17210 Nicotine dependence, cigarettes, uncomplicated: Secondary | ICD-10-CM | POA: Diagnosis not present

## 2016-09-10 DIAGNOSIS — C3432 Malignant neoplasm of lower lobe, left bronchus or lung: Secondary | ICD-10-CM | POA: Diagnosis not present

## 2016-09-11 ENCOUNTER — Ambulatory Visit: Payer: Medicare HMO

## 2016-09-11 ENCOUNTER — Ambulatory Visit
Admission: RE | Admit: 2016-09-11 | Discharge: 2016-09-11 | Disposition: A | Discharge status: Home / Self Care | Payer: Medicare HMO | Source: Ambulatory Visit | Attending: Radiation Oncology | Admitting: Radiation Oncology

## 2016-09-11 DIAGNOSIS — R918 Other nonspecific abnormal finding of lung field: Secondary | ICD-10-CM | POA: Diagnosis not present

## 2016-09-11 DIAGNOSIS — Z510 Encounter for antineoplastic radiation therapy: Secondary | ICD-10-CM | POA: Diagnosis not present

## 2016-09-11 DIAGNOSIS — I110 Hypertensive heart disease with heart failure: Secondary | ICD-10-CM | POA: Diagnosis not present

## 2016-09-11 DIAGNOSIS — M797 Fibromyalgia: Secondary | ICD-10-CM | POA: Diagnosis not present

## 2016-09-11 DIAGNOSIS — F17210 Nicotine dependence, cigarettes, uncomplicated: Secondary | ICD-10-CM | POA: Diagnosis not present

## 2016-09-11 DIAGNOSIS — I509 Heart failure, unspecified: Secondary | ICD-10-CM | POA: Diagnosis not present

## 2016-09-11 DIAGNOSIS — G629 Polyneuropathy, unspecified: Secondary | ICD-10-CM | POA: Diagnosis not present

## 2016-09-11 DIAGNOSIS — C3432 Malignant neoplasm of lower lobe, left bronchus or lung: Secondary | ICD-10-CM | POA: Diagnosis not present

## 2016-09-11 DIAGNOSIS — J449 Chronic obstructive pulmonary disease, unspecified: Secondary | ICD-10-CM | POA: Diagnosis not present

## 2016-09-12 ENCOUNTER — Inpatient Hospital Stay (HOSPITAL_BASED_OUTPATIENT_CLINIC_OR_DEPARTMENT_OTHER): Payer: Medicare HMO | Admitting: Hematology and Oncology

## 2016-09-12 ENCOUNTER — Other Ambulatory Visit: Payer: Self-pay | Admitting: *Deleted

## 2016-09-12 ENCOUNTER — Inpatient Hospital Stay: Payer: Medicare HMO

## 2016-09-12 ENCOUNTER — Ambulatory Visit: Payer: Medicare HMO

## 2016-09-12 ENCOUNTER — Other Ambulatory Visit: Payer: Self-pay

## 2016-09-12 ENCOUNTER — Ambulatory Visit
Admission: RE | Admit: 2016-09-12 | Discharge: 2016-09-12 | Disposition: A | Discharge status: Home / Self Care | Payer: Medicare HMO | Source: Ambulatory Visit | Attending: Radiation Oncology | Admitting: Radiation Oncology

## 2016-09-12 ENCOUNTER — Encounter: Payer: Self-pay | Admitting: Hematology and Oncology

## 2016-09-12 ENCOUNTER — Other Ambulatory Visit: Payer: Self-pay | Admitting: Hematology and Oncology

## 2016-09-12 VITALS — BP 130/84 | HR 89 | Temp 96.8°F | Wt 113.2 lb

## 2016-09-12 DIAGNOSIS — C3432 Malignant neoplasm of lower lobe, left bronchus or lung: Secondary | ICD-10-CM | POA: Diagnosis not present

## 2016-09-12 DIAGNOSIS — R918 Other nonspecific abnormal finding of lung field: Secondary | ICD-10-CM | POA: Diagnosis not present

## 2016-09-12 DIAGNOSIS — Z853 Personal history of malignant neoplasm of breast: Secondary | ICD-10-CM

## 2016-09-12 DIAGNOSIS — F17210 Nicotine dependence, cigarettes, uncomplicated: Secondary | ICD-10-CM | POA: Diagnosis not present

## 2016-09-12 DIAGNOSIS — R5383 Other fatigue: Secondary | ICD-10-CM

## 2016-09-12 DIAGNOSIS — J449 Chronic obstructive pulmonary disease, unspecified: Secondary | ICD-10-CM

## 2016-09-12 DIAGNOSIS — Z9223 Personal history of estrogen therapy: Secondary | ICD-10-CM

## 2016-09-12 DIAGNOSIS — Z5111 Encounter for antineoplastic chemotherapy: Secondary | ICD-10-CM | POA: Diagnosis not present

## 2016-09-12 DIAGNOSIS — M797 Fibromyalgia: Secondary | ICD-10-CM | POA: Diagnosis not present

## 2016-09-12 DIAGNOSIS — N390 Urinary tract infection, site not specified: Secondary | ICD-10-CM

## 2016-09-12 DIAGNOSIS — Z9981 Dependence on supplemental oxygen: Secondary | ICD-10-CM

## 2016-09-12 DIAGNOSIS — D649 Anemia, unspecified: Secondary | ICD-10-CM | POA: Diagnosis not present

## 2016-09-12 DIAGNOSIS — G629 Polyneuropathy, unspecified: Secondary | ICD-10-CM | POA: Diagnosis not present

## 2016-09-12 DIAGNOSIS — B9620 Unspecified Escherichia coli [E. coli] as the cause of diseases classified elsewhere: Secondary | ICD-10-CM

## 2016-09-12 DIAGNOSIS — Z9221 Personal history of antineoplastic chemotherapy: Secondary | ICD-10-CM

## 2016-09-12 DIAGNOSIS — I110 Hypertensive heart disease with heart failure: Secondary | ICD-10-CM

## 2016-09-12 DIAGNOSIS — Z881 Allergy status to other antibiotic agents status: Secondary | ICD-10-CM

## 2016-09-12 DIAGNOSIS — Z923 Personal history of irradiation: Secondary | ICD-10-CM | POA: Diagnosis not present

## 2016-09-12 DIAGNOSIS — Z792 Long term (current) use of antibiotics: Secondary | ICD-10-CM | POA: Diagnosis not present

## 2016-09-12 DIAGNOSIS — R600 Localized edema: Secondary | ICD-10-CM | POA: Diagnosis not present

## 2016-09-12 DIAGNOSIS — R350 Frequency of micturition: Secondary | ICD-10-CM

## 2016-09-12 DIAGNOSIS — C50912 Malignant neoplasm of unspecified site of left female breast: Secondary | ICD-10-CM

## 2016-09-12 DIAGNOSIS — Z7952 Long term (current) use of systemic steroids: Secondary | ICD-10-CM

## 2016-09-12 DIAGNOSIS — Z170 Estrogen receptor positive status [ER+]: Secondary | ICD-10-CM

## 2016-09-12 DIAGNOSIS — I509 Heart failure, unspecified: Secondary | ICD-10-CM | POA: Diagnosis not present

## 2016-09-12 DIAGNOSIS — I700 Atherosclerosis of aorta: Secondary | ICD-10-CM

## 2016-09-12 DIAGNOSIS — Z880 Allergy status to penicillin: Secondary | ICD-10-CM

## 2016-09-12 DIAGNOSIS — Z79899 Other long term (current) drug therapy: Secondary | ICD-10-CM

## 2016-09-12 DIAGNOSIS — M069 Rheumatoid arthritis, unspecified: Secondary | ICD-10-CM

## 2016-09-12 DIAGNOSIS — Z87440 Personal history of urinary (tract) infections: Secondary | ICD-10-CM

## 2016-09-12 DIAGNOSIS — Z510 Encounter for antineoplastic radiation therapy: Secondary | ICD-10-CM | POA: Diagnosis not present

## 2016-09-12 LAB — CBC WITH DIFFERENTIAL/PLATELET
Basophils Absolute: 0 10*3/uL (ref 0–0.1)
Basophils Relative: 1 %
Eosinophils Absolute: 0.3 10*3/uL (ref 0–0.7)
Eosinophils Relative: 7 %
HCT: 31.1 % — ABNORMAL LOW (ref 35.0–47.0)
Hemoglobin: 10.6 g/dL — ABNORMAL LOW (ref 12.0–16.0)
Lymphocytes Relative: 21 %
Lymphs Abs: 0.7 10*3/uL — ABNORMAL LOW (ref 1.0–3.6)
MCH: 30.5 pg (ref 26.0–34.0)
MCHC: 34.1 g/dL (ref 32.0–36.0)
MCV: 89.5 fL (ref 80.0–100.0)
Monocytes Absolute: 0.3 10*3/uL (ref 0.2–0.9)
Monocytes Relative: 8 %
Neutro Abs: 2.2 10*3/uL (ref 1.4–6.5)
Neutrophils Relative %: 63 %
Platelets: 206 10*3/uL (ref 150–440)
RBC: 3.48 MIL/uL — ABNORMAL LOW (ref 3.80–5.20)
RDW: 18.6 % — ABNORMAL HIGH (ref 11.5–14.5)
WBC: 3.5 10*3/uL — ABNORMAL LOW (ref 3.6–11.0)

## 2016-09-12 LAB — URINALYSIS, COMPLETE (UACMP) WITH MICROSCOPIC
BACTERIA UA: NONE SEEN
BILIRUBIN URINE: NEGATIVE
GLUCOSE, UA: NEGATIVE mg/dL
Hgb urine dipstick: NEGATIVE
Ketones, ur: NEGATIVE mg/dL
Nitrite: NEGATIVE
PH: 6 (ref 5.0–8.0)
Protein, ur: NEGATIVE mg/dL
SPECIFIC GRAVITY, URINE: 1.009 (ref 1.005–1.030)

## 2016-09-12 LAB — COMPREHENSIVE METABOLIC PANEL
ALT: 16 U/L (ref 14–54)
AST: 27 U/L (ref 15–41)
Albumin: 3.2 g/dL — ABNORMAL LOW (ref 3.5–5.0)
Alkaline Phosphatase: 109 U/L (ref 38–126)
Anion gap: 7 (ref 5–15)
BUN: 12 mg/dL (ref 6–20)
CO2: 29 mmol/L (ref 22–32)
Calcium: 8.8 mg/dL — ABNORMAL LOW (ref 8.9–10.3)
Chloride: 97 mmol/L — ABNORMAL LOW (ref 101–111)
Creatinine, Ser: 1.15 mg/dL — ABNORMAL HIGH (ref 0.44–1.00)
GFR calc Af Amer: 53 mL/min — ABNORMAL LOW (ref >60)
GFR calc non Af Amer: 46 mL/min — ABNORMAL LOW (ref >60)
Glucose, Bld: 141 mg/dL — ABNORMAL HIGH (ref 65–99)
Potassium: 4 mmol/L (ref 3.5–5.1)
Sodium: 133 mmol/L — ABNORMAL LOW (ref 135–145)
Total Bilirubin: 0.3 mg/dL (ref 0.3–1.2)
Total Protein: 6.8 g/dL (ref 6.5–8.1)

## 2016-09-12 LAB — MAGNESIUM: Magnesium: 1.9 mg/dL (ref 1.7–2.4)

## 2016-09-12 NOTE — Progress Notes (Signed)
[facility] day:  09/12/2016   Chief Complaint: [name] [name] is a 76 y.o. 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  HPI: The patient was last seen in the medical oncology clinic on 09/05/2016.  At that time, she felt tired.  She had some swelling in her legs.  She had chronic shortness of breath.  She received week #4 chemotherapy.  During the interim, patient has had a persistent urinary tract infection. She reports symptoms x 2 weeks. She has been seen by Dr. Ginette Pitman and by Dr. Jacqlyn Larsen.  She took Macrobid (7 day course x 1) and Monurol (x 2 doses).  She notes that Dr. Jacqlyn Larsen is going to admit her if she doesn't clear up after the antibiotics.  She describes lower back pain and frequency.  She denies dysuria and fever (Tmax 99.6). She has a repeat UA to be obtained in 2 weeks per patient report.  Radiation therapy completes on 09/18/2016.   Past Medical History:  Diagnosis Date  . Asthma   . Breast cancer ([place]) 2009   left  . Cancer ([place])   . CHF (congestive heart failure) ([place])   . CHF (congestive heart failure) ([place])   . Chronic UTI   . COPD (chronic obstructive pulmonary disease) ([place])   . Dizziness   . Fibromyalgia   . Hypertension   . Neuropathy   . Personal history of tobacco use, presenting hazards to health 05/17/2015  . Polyp, larynx   . RA (rheumatoid arthritis) ([place])   . Sinus infection    recent  . Stumbling gait    to the left  . Supplemental oxygen dependent    2.5l    Past Surgical History:  Procedure Laterality Date  . ABDOMINAL HYSTERECTOMY    . BREAST LUMPECTOMY Left 2009   chemo and radiation  . CYST EXCISION Left 02/27/2015   Procedure: CYST REMOVAL;  Surgeon: Hessie Knows, MD;  Location: [facility] ORS;  Service: Orthopedics;  Laterality: Left;  . EYE MUSCLE SURGERY Right    13 surgeries  .  FLEXIBLE BRONCHOSCOPY N/A 07/01/2016   Procedure: FLEXIBLE BRONCHOSCOPY;  Surgeon: Wilhelmina Mcardle, MD;  Location: [facility] ORS;  Service: Pulmonary;  Laterality: N/A;  . THUMB ARTHROSCOPY Left     Family History  Problem Relation Age of Onset  . Diabetes Father   . Stroke Father   . Heart attack Father   . CAD Sister     Social History:  reports that she has been smoking Cigarettes.  She has a 40.00 pack-year smoking history. She has never used smokeless tobacco. She reports that she does not drink alcohol or use drugs.  He lives with her son and grandson. The patient is alone today.   Allergies:  Allergies  Allergen Reactions  . Contrast Media [Iodinated Diagnostic Agents] Other (See Comments)    Pt was sent to the ED following contrast media injection at [place]. Unknown reason. She has been premedicated since without complications. Pt to be premedicated prior to contrast media injections  . Ioxaglate Other (See Comments)    Pt was sent to the ED following contrast media injection at [place]. Unknown reason. She has been premedicated since without complications. Pt to be premedicated prior to contrast media injections  . Sulfa Antibiotics     Other reaction(s): Other (See Comments)  .  Tetracycline Hives and Other (See Comments)  . White Petrolatum Other (See Comments)  . Amoxicillin-Pot Clavulanate Rash    Other reaction(s): Unknown Other reaction(s): Unknown _0  Other reaction(s): Unknown Blisters in mouth Blisters in mouth  . Tape Rash    Current Medications: Current Outpatient Prescriptions  Medication Sig Dispense Refill  . benzonatate (TESSALON) 200 MG capsule Take 1 capsule (200 mg total) by mouth 3 (three) times daily as needed for cough. 20 capsule 0  . Calcium-Vitamin D 600-200 MG-UNIT tablet Take 1 tablet by mouth daily.     . carvedilol (COREG) 12.5 MG  tablet Take 1 tablet (12.5 mg total) by mouth 2 (two) times daily with a meal. 60 tablet 0  . cholecalciferol (VITAMIN D) 1000 units tablet Take 1,000 Units by mouth daily.    . clonazePAM (KLONOPIN) 1 MG tablet Take 1 mg by mouth 2 (two) times daily as needed for anxiety.     . gabapentin (NEURONTIN) 600 MG tablet Take 600 mg by mouth 3 (three) times daily.     [name] guaiFENesin-codeine 100-10 MG/5ML syrup Take 5 mLs by mouth 3 (three) times daily as needed for cough. 120 mL 0  . HYDROcodone-acetaminophen (NORCO/VICODIN) 5-325 MG tablet Take 1-2 tablets by mouth every 4 (four) hours as needed for moderate pain.     [name] imipramine (TOFRANIL) 25 MG tablet Take 1 tablet by mouth at bedtime.     . iron polysaccharides (FERREX 150) 150 MG capsule Take 150 mg by mouth daily.    [name] LORazepam (ATIVAN) 0.5 MG tablet Take 1 tablet (0.5 mg total) by mouth every 6 (six) hours as needed (Nausea or vomiting). 30 tablet 0  . MONUROL 3 g PACK Take 3 g by mouth one time only at 6 PM.    . Multiple Vitamin (MULTIVITAMIN) capsule Take 1 capsule by mouth daily.    . nitrofurantoin, macrocrystal-monohydrate, (MACROBID) 100 MG capsule Take 100 mg by mouth 2 (two) times daily.    . ondansetron (ZOFRAN) 8 MG tablet Take 1 tablet (8 mg total) by mouth 2 (two) times daily as needed for refractory nausea / vomiting. Start on day 3 after chemo. 30 tablet 1  . predniSONE (DELTASONE) 20 MG tablet Take 2 tablets (40 mg total) by mouth daily with breakfast. 10 days 20 tablet 0  . sucralfate (CARAFATE) 1 g tablet Take 1 tablet (1 g total) by mouth 3 (three) times daily. 90 tablet 6  . Tiotropium Bromide Monohydrate (SPIRIVA RESPIMAT) 2.5 MCG/ACT AERS Inhale 2.5 mcg into the lungs 2 (two) times daily. (Patient taking differently: Inhale 2.5 mcg into the lungs daily. ) 1 Inhaler 0  . traMADol (ULTRAM) 50 MG tablet Take 50 mg by mouth daily.     . vitamin C (ASCORBIC ACID) 500 MG tablet Take 500 mg by mouth daily.    [name] ZINC SULFATE PO Take  1 tablet by mouth daily.     [name] zolpidem (AMBIEN) 5 MG tablet Take 5 mg by mouth at bedtime.     No current facility-administered medications for this visit.     Review of Systems:  GENERAL:  "Just doing".  Tired.  No fevers or sweats.  Weight down 2 pounds. PERFORMANCE STATUS (ECOG):  2 HEENT:  No visual changes, runny nose, sore throat, mouth sores or tenderness. Lungs: Shortness of breath (stable).  Cough.  No hemoptysis.  On oxygen 2 liter/min [place]. Cardiac:  No chest pain, palpitations, orthopnea, or PND. GI:  No nausea, vomiting, diarrhea, constipation, melena or hematochezia. GU:  Current UTI; frequency and back pain. No urgency, dysuria, or hematuria.  Musculoskeletal:  Fibromyalgia.  Osteoporosis.  No back pain.  No joint pain.  No muscle tenderness. Extremities:  No pain or swelling. Skin:  No rashes or skin changes. Neuro:  No headache, numbness or weakness, balance or coordination issues. Endocrine:  No diabetes, thyroid issues, hot flashes or night sweats. Psych:  No mood changes, depression or anxiety. Pain:  No focal pain. Review of systems:  All other systems reviewed and found to be negative.  Physical Exam: Blood pressure 130/84, pulse 89, temperature (!) 96.8 F (36 C), temperature source Tympanic, weight 113 lb 3 oz (51.3 kg). O2 sats 95%. GENERAL:  Thin woman sitting comfortably in a wheelchair in the exam room in no acute distress. MENTAL STATUS:  Alert and oriented to person, place and time. HEAD:  Short blonde hair.  Normocephalic, atraumatic, face symmetric, no Cushingoid features. EYES:  Blue eyes.  Pupils equal round and reactive to light and accomodation.  No conjunctivitis or scleral icterus. ENT:  Oropharynx clear without lesion.  Tongue normal. Mucous membranes moist.  RESPIRATORY:  Clear to auscultation.  No rales, wheezes or rhonchi. CARDIOVASCULAR:  Regular rate and rhythm without murmur, rub or gallop. ABDOMEN:  Soft, non tender with active bowel  sounds and no hepatosplenomegaly.  No masses. SKIN:  No rashes, ulcers or lesions. EXTREMITIES: Arthritic changes in hands.  Ankle edema.  No skin discoloration or tenderness.  No palpable cords. LYMPH NODES: No palpable cervical, supraclavicular, axillary or inguinal adenopathy  NEUROLOGICAL: Alert and interactive.  Unremarkable. PSYCH:  Appropriate.   Appointment on 09/12/2016  Component Date Value Ref Range Status  . Magnesium 09/12/2016 1.9  1.7 - 2.4 mg/dL Final  . Sodium 09/12/2016 133* 135 - 145 mmol/L Final  . Potassium 09/12/2016 4.0  3.5 - 5.1 mmol/L Final  . Chloride 09/12/2016 97* 101 - 111 mmol/L Final  . CO2 09/12/2016 29  22 - 32 mmol/L Final  . Glucose, Bld 09/12/2016 141* 65 - 99 mg/dL Final  . BUN 09/12/2016 12  6 - 20 mg/dL Final  . Creatinine, Ser 09/12/2016 1.15* 0.44 - 1.00 mg/dL Final  . Calcium 09/12/2016 8.8* 8.9 - 10.3 mg/dL Final  . Total Protein 09/12/2016 6.8  6.5 - 8.1 g/dL Final  . Albumin 09/12/2016 3.2* 3.5 - 5.0 g/dL Final  . AST 09/12/2016 27  15 - 41 U/L Final  . ALT 09/12/2016 16  14 - 54 U/L Final  . Alkaline Phosphatase 09/12/2016 109  38 - 126 U/L Final  . Total Bilirubin 09/12/2016 0.3  0.3 - 1.2 mg/dL Final  . GFR calc non Af Amer 09/12/2016 46* >60 mL/min Final  . GFR calc Af Amer 09/12/2016 53* >60 mL/min Final   Comment: (NOTE) The eGFR has been calculated using the CKD EPI equation. This calculation has not been validated in all clinical situations. eGFR's persistently <60 mL/min signify possible Chronic Kidney Disease.   . Anion gap 09/12/2016 7  5 - 15 Final  . WBC 09/12/2016 3.5* 3.6 - 11.0 K/uL Final  . RBC 09/12/2016 3.48* 3.80 - 5.20 MIL/uL Final  . Hemoglobin 09/12/2016 10.6* 12.0 - 16.0 g/dL Final  . HCT 09/12/2016 31.1* 35.0 - 47.0 % Final  . MCV 09/12/2016 89.5  80.0 - 100.0 fL Final  . MCH 09/12/2016 30.5  26.0 - 34.0 pg Final  .  MCHC 09/12/2016 34.1  32.0 - 36.0 g/dL Final  . RDW 09/12/2016 18.6* 11.5 - 14.5 % Final   . Platelets 09/12/2016 206  150 - 440 K/uL Final  . Neutrophils Relative % 09/12/2016 63  % Final  . Neutro Abs 09/12/2016 2.2  1.4 - 6.5 K/uL Final  . Lymphocytes Relative 09/12/2016 21  % Final  . Lymphs Abs 09/12/2016 0.7* 1.0 - 3.6 K/uL Final  . Monocytes Relative 09/12/2016 8  % Final  . Monocytes Absolute 09/12/2016 0.3  0.2 - 0.9 K/uL Final  . Eosinophils Relative 09/12/2016 7  % Final  . Eosinophils Absolute 09/12/2016 0.3  0 - 0.7 K/uL Final  . Basophils Relative 09/12/2016 1  % Final  . Basophils Absolute 09/12/2016 0.0  0 - 0.1 K/uL Final    Assessment:  [name] [name] is a 76 y.o. female with a clinical T2bNxM0 sqamous cell lung cancer of the left lung s/p bronchoscopy and biopsy on 07/01/2016.  She has a 40 pack year smoking history.  She presented with left lower chest wall pain.  Chest CT with contrast on 06/11/2016 revealed a 3.2 x 3.0 mass like area of focal opacity in the medial left lower lobe with obliteration of segmental airways to the anterior left lower lobe.  PET scan on 06/27/2016 revealed a 4.3 x 3.2 cm central left lower lobe lung lesion (SUV 14.3) consistent with primary bronchogenic carcinoma.  There was equivocal nodal tissue in the subcarinal station demonstrating mild hypermetabolism (SUV 3.6). There was more peripheral left lower lobe increased atelectasis with mucoid impaction which is likely secondary to endobronchial obstruction.  There was a small left pleural effusion.  Head MRI on 07/07/2016 revealed no evidence of metastatic disease.  She began radiation on 07/22/2016.  She missed a few days.  She is s/p week #4 concurrent carboplatin and Taxol (07/24/2016 - 08/11/2016; 09/05/2016). She requires a reduced dose of Benadryl (25 mg) for her premedication.  Week #3 was postponed secondary to chest pain and evaluation.  She missed a couple of weeks.  She has a normocytic anemia likely due to chemotherapy.  Work-up on 07/24/2016 revealed the following  normal labs: ferritin (103), B12 (345), folate(19.7).  Iron saturation was 8% and TIBC was 258.  She has a history of stage IA left breast cancer in 02/2005.  She underwent lumpectomy.  Pathology revealed a T1cN0 lesion.  Tumor was ER + , PR +, and Her2/neu -.  Two sentinel lymph nodes were negative.  She received chemotherapy (4 cycles of AC and possibly an abbreviated course of Taxol- no records available).  She received radiation.  She completed Femara in 08/2010.  She has fibromyalgia.  She has advanced thoracoabdominal aortic atherosclerosis with fusiform dilatation of the descending thoracic aorta up to 4.3 cm.  She is followed by Dr Lucky Cowboy.  Symptomatically, she has a UTI symptoms and has completed 2 courses of antibiotics for a fairly resistant E coli infection.  Exam is stable.  Counts are borderline with a WBC of 3500 with an ANC of 2200.  Plan: 1.  Labs today:  CBC with diff, CMP, Mg. 2.  UA and urine culture 3.  Discuss current urinary infection with Dr. Ola Spurr (planned referral per patient) as she is on active chemotherapy treatment.  If urine culture positive, anticipate ertapenem Colbert Ewing). 4.  Delay current chemotherapy cycle until 09/16/2016 due to urinary symptoms and patient time constraints today.  5.  RTC on 09/23/2016 for MD assessment, labs (CBC with  diff, CMP, Mg), and week #6 carboplatin and Taxol.   Honor Loh, NP  09/12/2016, 9:45 AM   I saw and evaluated the patient, participating in the key portions of the service and reviewing pertinent diagnostic studies and records.  I reviewed the nurse practitioner's note and agree with the findings and the plan.  The assessment and plan were discussed with the patient.  Additional diagnostic studies of a UA and culture are needed to clarify her current UTI and would change the clinical management.  A few questions were asked by the patient and answered.   Lequita Asal, MD 09/12/2016

## 2016-09-12 NOTE — Progress Notes (Signed)
Patient states she currently has a UTI.  She is under the care of Dr. Jacqlyn Larsen.  States she had some nausea last night.  Appetite is decreased.  She only eats if she has a craving for something.

## 2016-09-13 LAB — URINE CULTURE

## 2016-09-16 ENCOUNTER — Telehealth: Payer: Self-pay | Admitting: *Deleted

## 2016-09-16 ENCOUNTER — Ambulatory Visit
Admission: RE | Admit: 2016-09-16 | Discharge: 2016-09-16 | Disposition: A | Discharge status: Home / Self Care | Payer: Medicare HMO | Source: Ambulatory Visit | Attending: Radiation Oncology | Admitting: Radiation Oncology

## 2016-09-16 ENCOUNTER — Inpatient Hospital Stay: Payer: Medicare HMO | Attending: Hematology and Oncology

## 2016-09-16 ENCOUNTER — Other Ambulatory Visit: Payer: Self-pay | Admitting: *Deleted

## 2016-09-16 ENCOUNTER — Inpatient Hospital Stay: Payer: Medicare HMO

## 2016-09-16 ENCOUNTER — Other Ambulatory Visit: Payer: Self-pay | Admitting: Urgent Care

## 2016-09-16 ENCOUNTER — Ambulatory Visit: Payer: Medicare HMO

## 2016-09-16 ENCOUNTER — Other Ambulatory Visit: Payer: Self-pay | Admitting: Hematology and Oncology

## 2016-09-16 VITALS — BP 122/69 | HR 80 | Temp 96.0°F | Resp 18

## 2016-09-16 DIAGNOSIS — R200 Anesthesia of skin: Secondary | ICD-10-CM | POA: Insufficient documentation

## 2016-09-16 DIAGNOSIS — I509 Heart failure, unspecified: Secondary | ICD-10-CM | POA: Diagnosis not present

## 2016-09-16 DIAGNOSIS — R0602 Shortness of breath: Secondary | ICD-10-CM | POA: Diagnosis not present

## 2016-09-16 DIAGNOSIS — Z87440 Personal history of urinary (tract) infections: Secondary | ICD-10-CM | POA: Diagnosis not present

## 2016-09-16 DIAGNOSIS — C3432 Malignant neoplasm of lower lobe, left bronchus or lung: Secondary | ICD-10-CM | POA: Insufficient documentation

## 2016-09-16 DIAGNOSIS — D649 Anemia, unspecified: Secondary | ICD-10-CM | POA: Insufficient documentation

## 2016-09-16 DIAGNOSIS — Z9981 Dependence on supplemental oxygen: Secondary | ICD-10-CM | POA: Diagnosis not present

## 2016-09-16 DIAGNOSIS — Z923 Personal history of irradiation: Secondary | ICD-10-CM | POA: Insufficient documentation

## 2016-09-16 DIAGNOSIS — C50912 Malignant neoplasm of unspecified site of left female breast: Secondary | ICD-10-CM

## 2016-09-16 DIAGNOSIS — J449 Chronic obstructive pulmonary disease, unspecified: Secondary | ICD-10-CM | POA: Diagnosis not present

## 2016-09-16 DIAGNOSIS — Z510 Encounter for antineoplastic radiation therapy: Secondary | ICD-10-CM | POA: Diagnosis not present

## 2016-09-16 DIAGNOSIS — Z170 Estrogen receptor positive status [ER+]: Principal | ICD-10-CM

## 2016-09-16 DIAGNOSIS — Z881 Allergy status to other antibiotic agents status: Secondary | ICD-10-CM | POA: Diagnosis not present

## 2016-09-16 DIAGNOSIS — R197 Diarrhea, unspecified: Secondary | ICD-10-CM | POA: Diagnosis not present

## 2016-09-16 DIAGNOSIS — Z880 Allergy status to penicillin: Secondary | ICD-10-CM | POA: Insufficient documentation

## 2016-09-16 DIAGNOSIS — M797 Fibromyalgia: Secondary | ICD-10-CM | POA: Diagnosis not present

## 2016-09-16 DIAGNOSIS — I110 Hypertensive heart disease with heart failure: Secondary | ICD-10-CM | POA: Insufficient documentation

## 2016-09-16 DIAGNOSIS — Z79899 Other long term (current) drug therapy: Secondary | ICD-10-CM | POA: Diagnosis not present

## 2016-09-16 DIAGNOSIS — G629 Polyneuropathy, unspecified: Secondary | ICD-10-CM | POA: Diagnosis not present

## 2016-09-16 DIAGNOSIS — F17210 Nicotine dependence, cigarettes, uncomplicated: Secondary | ICD-10-CM | POA: Diagnosis not present

## 2016-09-16 DIAGNOSIS — Z5111 Encounter for antineoplastic chemotherapy: Secondary | ICD-10-CM | POA: Insufficient documentation

## 2016-09-16 DIAGNOSIS — I700 Atherosclerosis of aorta: Secondary | ICD-10-CM | POA: Diagnosis not present

## 2016-09-16 DIAGNOSIS — I5022 Chronic systolic (congestive) heart failure: Secondary | ICD-10-CM | POA: Diagnosis not present

## 2016-09-16 DIAGNOSIS — R918 Other nonspecific abnormal finding of lung field: Secondary | ICD-10-CM | POA: Diagnosis not present

## 2016-09-16 LAB — COMPREHENSIVE METABOLIC PANEL
ALT: 11 U/L — ABNORMAL LOW (ref 14–54)
AST: 26 U/L (ref 15–41)
Albumin: 3.5 g/dL (ref 3.5–5.0)
Alkaline Phosphatase: 107 U/L (ref 38–126)
Anion gap: 8 (ref 5–15)
BUN: 15 mg/dL (ref 6–20)
CO2: 26 mmol/L (ref 22–32)
Calcium: 8.8 mg/dL — ABNORMAL LOW (ref 8.9–10.3)
Chloride: 100 mmol/L — ABNORMAL LOW (ref 101–111)
Creatinine, Ser: 1.22 mg/dL — ABNORMAL HIGH (ref 0.44–1.00)
GFR calc Af Amer: 50 mL/min — ABNORMAL LOW (ref >60)
GFR calc non Af Amer: 43 mL/min — ABNORMAL LOW (ref >60)
Glucose, Bld: 107 mg/dL — ABNORMAL HIGH (ref 65–99)
Potassium: 3.4 mmol/L — ABNORMAL LOW (ref 3.5–5.1)
Sodium: 134 mmol/L — ABNORMAL LOW (ref 135–145)
Total Bilirubin: 0.3 mg/dL (ref 0.3–1.2)
Total Protein: 7 g/dL (ref 6.5–8.1)

## 2016-09-16 LAB — CBC WITH DIFFERENTIAL/PLATELET
Basophils Absolute: 0.1 10*3/uL (ref 0–0.1)
Basophils Relative: 3 %
Eosinophils Absolute: 0.2 10*3/uL (ref 0–0.7)
Eosinophils Relative: 6 %
HCT: 32.6 % — ABNORMAL LOW (ref 35.0–47.0)
Hemoglobin: 11.2 g/dL — ABNORMAL LOW (ref 12.0–16.0)
Lymphocytes Relative: 22 %
Lymphs Abs: 0.7 10*3/uL — ABNORMAL LOW (ref 1.0–3.6)
MCH: 30.8 pg (ref 26.0–34.0)
MCHC: 34.3 g/dL (ref 32.0–36.0)
MCV: 89.6 fL (ref 80.0–100.0)
Monocytes Absolute: 0.4 10*3/uL (ref 0.2–0.9)
Monocytes Relative: 11 %
Neutro Abs: 1.9 10*3/uL (ref 1.4–6.5)
Neutrophils Relative %: 58 %
Platelets: 188 10*3/uL (ref 150–440)
RBC: 3.64 MIL/uL — ABNORMAL LOW (ref 3.80–5.20)
RDW: 18.7 % — ABNORMAL HIGH (ref 11.5–14.5)
WBC: 3.3 10*3/uL — ABNORMAL LOW (ref 3.6–11.0)

## 2016-09-16 LAB — MAGNESIUM: Magnesium: 1.9 mg/dL (ref 1.7–2.4)

## 2016-09-16 MED ORDER — POTASSIUM CHLORIDE ER 10 MEQ PO TBCR: 10.0000 meq | EXTENDED_RELEASE_TABLET | Freq: Every day | ORAL | 0 refills | Status: DC

## 2016-09-16 MED ORDER — DEXAMETHASONE SODIUM PHOSPHATE 100 MG/10ML IJ SOLN
20.0000 mg | Freq: Once | INTRAMUSCULAR | Status: AC
  Administered 2016-09-16: 20 mg via INTRAVENOUS
  Filled 2016-09-16: qty 2

## 2016-09-16 MED ORDER — FAMOTIDINE IN NACL 20-0.9 MG/50ML-% IV SOLN
20.0000 mg | Freq: Once | INTRAVENOUS | Status: AC
  Administered 2016-09-16: 20 mg via INTRAVENOUS
  Filled 2016-09-16: qty 50

## 2016-09-16 MED ORDER — DIPHENHYDRAMINE HCL 50 MG/ML IJ SOLN
25.0000 mg | Freq: Once | INTRAMUSCULAR | Status: AC
  Administered 2016-09-16: 25 mg via INTRAVENOUS
  Filled 2016-09-16: qty 1

## 2016-09-16 MED ORDER — PACLITAXEL CHEMO INJECTION 300 MG/50ML
45.0000 mg/m2 | Freq: Once | INTRAVENOUS | Status: AC
  Administered 2016-09-16: 66 mg via INTRAVENOUS
  Filled 2016-09-16: qty 11

## 2016-09-16 MED ORDER — SODIUM CHLORIDE 0.9 % IV SOLN
Freq: Once | INTRAVENOUS | Status: AC
Start: 2016-09-16 — End: 2016-09-16
  Administered 2016-09-16: 09:00:00 via INTRAVENOUS
  Filled 2016-09-16: qty 1000

## 2016-09-16 MED ORDER — SODIUM CHLORIDE 0.9 % IV SOLN
110.0000 mg | Freq: Once | INTRAVENOUS | Status: AC
  Administered 2016-09-16: 110 mg via INTRAVENOUS
  Filled 2016-09-16: qty 11

## 2016-09-16 MED ORDER — DEXTROMETHORPHAN-GUAIFENESIN 10-100 MG/5ML PO LIQD: 5.0000 mL | Freq: Four times a day (QID) | ORAL | 1 refills | Status: DC | PRN

## 2016-09-16 MED ORDER — PALONOSETRON HCL INJECTION 0.25 MG/5ML
0.2500 mg | Freq: Once | INTRAVENOUS | Status: AC
  Administered 2016-09-16: 0.25 mg via INTRAVENOUS
  Filled 2016-09-16: qty 5

## 2016-09-16 NOTE — Telephone Encounter (Signed)
Called patient.  She was not home.  Had previously called chemo and patient had left.  Then called radiation and spoke to [facility], who verified patient was getting radiation.   She will tell her about her K+.  Prescription has been called per Gaspar Bidding, NP>

## 2016-09-16 NOTE — Telephone Encounter (Signed)
-----   Message from Lequita Asal, MD sent at 09/16/2016  9:36 AM EDT ----- Regarding: Patient needs potassium  Potassium chloride 10 meq a day.  M  ----- Message ----- From: Interface, Lab In [place] Sent: 09/16/2016   8:27 AM To: Lequita Asal, MD

## 2016-09-17 ENCOUNTER — Ambulatory Visit: Payer: Medicare HMO

## 2016-09-17 ENCOUNTER — Ambulatory Visit
Admission: RE | Admit: 2016-09-17 | Discharge: 2016-09-17 | Disposition: A | Discharge status: Home / Self Care | Payer: Medicare HMO | Source: Ambulatory Visit | Attending: Radiation Oncology | Admitting: Radiation Oncology

## 2016-09-17 DIAGNOSIS — Z510 Encounter for antineoplastic radiation therapy: Secondary | ICD-10-CM | POA: Diagnosis not present

## 2016-09-17 DIAGNOSIS — M797 Fibromyalgia: Secondary | ICD-10-CM | POA: Diagnosis not present

## 2016-09-17 DIAGNOSIS — G629 Polyneuropathy, unspecified: Secondary | ICD-10-CM | POA: Diagnosis not present

## 2016-09-17 DIAGNOSIS — I110 Hypertensive heart disease with heart failure: Secondary | ICD-10-CM | POA: Diagnosis not present

## 2016-09-17 DIAGNOSIS — I509 Heart failure, unspecified: Secondary | ICD-10-CM | POA: Diagnosis not present

## 2016-09-17 DIAGNOSIS — J449 Chronic obstructive pulmonary disease, unspecified: Secondary | ICD-10-CM | POA: Diagnosis not present

## 2016-09-17 DIAGNOSIS — C3432 Malignant neoplasm of lower lobe, left bronchus or lung: Secondary | ICD-10-CM | POA: Diagnosis not present

## 2016-09-17 DIAGNOSIS — R918 Other nonspecific abnormal finding of lung field: Secondary | ICD-10-CM | POA: Diagnosis not present

## 2016-09-17 DIAGNOSIS — F17210 Nicotine dependence, cigarettes, uncomplicated: Secondary | ICD-10-CM | POA: Diagnosis not present

## 2016-09-18 ENCOUNTER — Ambulatory Visit: Payer: Medicare HMO

## 2016-09-18 ENCOUNTER — Ambulatory Visit
Admission: RE | Admit: 2016-09-18 | Discharge: 2016-09-18 | Disposition: A | Discharge status: Home / Self Care | Payer: Medicare HMO | Source: Ambulatory Visit | Attending: Radiation Oncology | Admitting: Radiation Oncology

## 2016-09-18 DIAGNOSIS — Z510 Encounter for antineoplastic radiation therapy: Secondary | ICD-10-CM | POA: Diagnosis not present

## 2016-09-18 DIAGNOSIS — I110 Hypertensive heart disease with heart failure: Secondary | ICD-10-CM | POA: Diagnosis not present

## 2016-09-18 DIAGNOSIS — I509 Heart failure, unspecified: Secondary | ICD-10-CM | POA: Diagnosis not present

## 2016-09-18 DIAGNOSIS — R918 Other nonspecific abnormal finding of lung field: Secondary | ICD-10-CM | POA: Diagnosis not present

## 2016-09-18 DIAGNOSIS — G629 Polyneuropathy, unspecified: Secondary | ICD-10-CM | POA: Diagnosis not present

## 2016-09-18 DIAGNOSIS — C3432 Malignant neoplasm of lower lobe, left bronchus or lung: Secondary | ICD-10-CM | POA: Diagnosis not present

## 2016-09-18 DIAGNOSIS — F17210 Nicotine dependence, cigarettes, uncomplicated: Secondary | ICD-10-CM | POA: Diagnosis not present

## 2016-09-18 DIAGNOSIS — M797 Fibromyalgia: Secondary | ICD-10-CM | POA: Diagnosis not present

## 2016-09-18 DIAGNOSIS — J449 Chronic obstructive pulmonary disease, unspecified: Secondary | ICD-10-CM | POA: Diagnosis not present

## 2016-09-19 ENCOUNTER — Ambulatory Visit
Admission: RE | Admit: 2016-09-19 | Discharge: 2016-09-19 | Disposition: A | Discharge status: Home / Self Care | Payer: Medicare HMO | Source: Ambulatory Visit | Attending: Radiation Oncology | Admitting: Radiation Oncology

## 2016-09-19 ENCOUNTER — Other Ambulatory Visit: Payer: Self-pay | Admitting: *Deleted

## 2016-09-19 DIAGNOSIS — C3432 Malignant neoplasm of lower lobe, left bronchus or lung: Secondary | ICD-10-CM | POA: Diagnosis not present

## 2016-09-19 DIAGNOSIS — G629 Polyneuropathy, unspecified: Secondary | ICD-10-CM | POA: Diagnosis not present

## 2016-09-19 DIAGNOSIS — Z510 Encounter for antineoplastic radiation therapy: Secondary | ICD-10-CM | POA: Diagnosis not present

## 2016-09-19 DIAGNOSIS — M797 Fibromyalgia: Secondary | ICD-10-CM | POA: Diagnosis not present

## 2016-09-19 DIAGNOSIS — J449 Chronic obstructive pulmonary disease, unspecified: Secondary | ICD-10-CM | POA: Diagnosis not present

## 2016-09-19 DIAGNOSIS — I110 Hypertensive heart disease with heart failure: Secondary | ICD-10-CM | POA: Diagnosis not present

## 2016-09-19 DIAGNOSIS — R918 Other nonspecific abnormal finding of lung field: Secondary | ICD-10-CM | POA: Diagnosis not present

## 2016-09-19 DIAGNOSIS — F17210 Nicotine dependence, cigarettes, uncomplicated: Secondary | ICD-10-CM | POA: Diagnosis not present

## 2016-09-19 DIAGNOSIS — I509 Heart failure, unspecified: Secondary | ICD-10-CM | POA: Diagnosis not present

## 2016-09-23 ENCOUNTER — Inpatient Hospital Stay: Payer: Medicare HMO

## 2016-09-23 ENCOUNTER — Inpatient Hospital Stay (HOSPITAL_BASED_OUTPATIENT_CLINIC_OR_DEPARTMENT_OTHER): Payer: Medicare HMO | Admitting: Hematology and Oncology

## 2016-09-23 ENCOUNTER — Encounter: Payer: Self-pay | Admitting: Hematology and Oncology

## 2016-09-23 ENCOUNTER — Other Ambulatory Visit: Payer: Self-pay | Admitting: *Deleted

## 2016-09-23 ENCOUNTER — Other Ambulatory Visit: Payer: Self-pay | Admitting: Hematology and Oncology

## 2016-09-23 VITALS — BP 113/73 | HR 92 | Temp 96.7°F | Resp 18 | Wt 110.2 lb

## 2016-09-23 DIAGNOSIS — Z880 Allergy status to penicillin: Secondary | ICD-10-CM

## 2016-09-23 DIAGNOSIS — F17210 Nicotine dependence, cigarettes, uncomplicated: Secondary | ICD-10-CM | POA: Diagnosis not present

## 2016-09-23 DIAGNOSIS — I110 Hypertensive heart disease with heart failure: Secondary | ICD-10-CM

## 2016-09-23 DIAGNOSIS — I700 Atherosclerosis of aorta: Secondary | ICD-10-CM

## 2016-09-23 DIAGNOSIS — Z5111 Encounter for antineoplastic chemotherapy: Secondary | ICD-10-CM | POA: Diagnosis not present

## 2016-09-23 DIAGNOSIS — M797 Fibromyalgia: Secondary | ICD-10-CM | POA: Diagnosis not present

## 2016-09-23 DIAGNOSIS — Z87440 Personal history of urinary (tract) infections: Secondary | ICD-10-CM

## 2016-09-23 DIAGNOSIS — Z85118 Personal history of other malignant neoplasm of bronchus and lung: Secondary | ICD-10-CM

## 2016-09-23 DIAGNOSIS — Z9981 Dependence on supplemental oxygen: Secondary | ICD-10-CM

## 2016-09-23 DIAGNOSIS — C3432 Malignant neoplasm of lower lobe, left bronchus or lung: Secondary | ICD-10-CM

## 2016-09-23 DIAGNOSIS — J449 Chronic obstructive pulmonary disease, unspecified: Secondary | ICD-10-CM | POA: Diagnosis not present

## 2016-09-23 DIAGNOSIS — Z881 Allergy status to other antibiotic agents status: Secondary | ICD-10-CM

## 2016-09-23 DIAGNOSIS — R200 Anesthesia of skin: Secondary | ICD-10-CM

## 2016-09-23 DIAGNOSIS — D649 Anemia, unspecified: Secondary | ICD-10-CM

## 2016-09-23 DIAGNOSIS — I509 Heart failure, unspecified: Secondary | ICD-10-CM | POA: Diagnosis not present

## 2016-09-23 DIAGNOSIS — Z79899 Other long term (current) drug therapy: Secondary | ICD-10-CM

## 2016-09-23 DIAGNOSIS — R197 Diarrhea, unspecified: Secondary | ICD-10-CM | POA: Diagnosis not present

## 2016-09-23 DIAGNOSIS — Z923 Personal history of irradiation: Secondary | ICD-10-CM | POA: Diagnosis not present

## 2016-09-23 DIAGNOSIS — R350 Frequency of micturition: Secondary | ICD-10-CM

## 2016-09-23 LAB — CBC WITH DIFFERENTIAL/PLATELET
Basophils Absolute: 0.1 10*3/uL (ref 0–0.1)
Basophils Relative: 2 %
Eosinophils Absolute: 0.2 10*3/uL (ref 0–0.7)
Eosinophils Relative: 7 %
HCT: 31.9 % — ABNORMAL LOW (ref 35.0–47.0)
Hemoglobin: 11.2 g/dL — ABNORMAL LOW (ref 12.0–16.0)
Lymphocytes Relative: 21 %
Lymphs Abs: 0.6 10*3/uL — ABNORMAL LOW (ref 1.0–3.6)
MCH: 31 pg (ref 26.0–34.0)
MCHC: 35 g/dL (ref 32.0–36.0)
MCV: 88.5 fL (ref 80.0–100.0)
Monocytes Absolute: 0.3 10*3/uL (ref 0.2–0.9)
Monocytes Relative: 10 %
Neutro Abs: 1.7 10*3/uL (ref 1.4–6.5)
Neutrophils Relative %: 60 %
Platelets: 158 10*3/uL (ref 150–440)
RBC: 3.61 MIL/uL — ABNORMAL LOW (ref 3.80–5.20)
RDW: 17.9 % — ABNORMAL HIGH (ref 11.5–14.5)
WBC: 2.8 10*3/uL — ABNORMAL LOW (ref 3.6–11.0)

## 2016-09-23 LAB — MAGNESIUM: Magnesium: 1.8 mg/dL (ref 1.7–2.4)

## 2016-09-23 LAB — COMPREHENSIVE METABOLIC PANEL
ALT: 16 U/L (ref 14–54)
AST: 26 U/L (ref 15–41)
Albumin: 3.5 g/dL (ref 3.5–5.0)
Alkaline Phosphatase: 100 U/L (ref 38–126)
Anion gap: 4 — ABNORMAL LOW (ref 5–15)
BUN: 16 mg/dL (ref 6–20)
CO2: 27 mmol/L (ref 22–32)
Calcium: 9.2 mg/dL (ref 8.9–10.3)
Chloride: 102 mmol/L (ref 101–111)
Creatinine, Ser: 1.11 mg/dL — ABNORMAL HIGH (ref 0.44–1.00)
GFR calc Af Amer: 56 mL/min — ABNORMAL LOW (ref >60)
GFR calc non Af Amer: 48 mL/min — ABNORMAL LOW (ref >60)
Glucose, Bld: 102 mg/dL — ABNORMAL HIGH (ref 65–99)
Potassium: 4.5 mmol/L (ref 3.5–5.1)
Sodium: 133 mmol/L — ABNORMAL LOW (ref 135–145)
Total Bilirubin: 0.5 mg/dL (ref 0.3–1.2)
Total Protein: 6.8 g/dL (ref 6.5–8.1)

## 2016-09-23 NOTE — Progress Notes (Signed)
[facility] day:  09/23/2016   Chief Complaint: [name] [name] is a 76 y.o. 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  HPI: The patient was last seen in the medical oncology clinic on 09/12/2016.  At that time, she noted UTI symptoms x 2 weeks.  She had completed 2 courses of antibiotics for a fairly resistant E coli infection.  Exam was stable.  Counts were borderline with a WBC of 3500 with an ANC of 2200.  Repeat UA and culture were negative.  Because of time restraints for the patient, she received cycle #5 on 09/16/2016.  She completed radiation on 09/19/2016.  During the interim, patient doing "ok". She has some numbness and tingling in her fingers. Patient has a "throbbing" to the dorsal aspect of her RIGHT hand; bruising noted. Patient with continued urinary symptoms. Dr. Jacqlyn Larsen plans to send patient to infectious disease provider Ola Spurr). Patient with diarrhea; "it only comes when I eat". Diarrhea has been persistent x 1 month; not worsening; denies hematochezia or melena. Patient is only eating 1 or 2 times a day. She has lost 3 pounds since her last visit. Patient experiencing hemoptysis; "Dr. Baruch Gouty gave me a pink horse pill".   Past Medical History:  Diagnosis Date  . Asthma   . Breast cancer ([place]) 2009   left  . Cancer ([place])   . CHF (congestive heart failure) ([place])   . CHF (congestive heart failure) ([place])   . Chronic UTI   . COPD (chronic obstructive pulmonary disease) ([place])   . Dizziness   . Fibromyalgia   . Hypertension   . Neuropathy   . Personal history of tobacco use, presenting hazards to health 05/17/2015  . Polyp, larynx   . RA (rheumatoid arthritis) ([place])   . Sinus infection    recent  . Stumbling gait    to the left  . Supplemental oxygen dependent    2.5l    Past Surgical History:   Procedure Laterality Date  . ABDOMINAL HYSTERECTOMY    . BREAST LUMPECTOMY Left 2009   chemo and radiation  . CYST EXCISION Left 02/27/2015   Procedure: CYST REMOVAL;  Surgeon: Hessie Knows, MD;  Location: [facility] ORS;  Service: Orthopedics;  Laterality: Left;  . EYE MUSCLE SURGERY Right    13 surgeries  . FLEXIBLE BRONCHOSCOPY N/A 07/01/2016   Procedure: FLEXIBLE BRONCHOSCOPY;  Surgeon: Wilhelmina Mcardle, MD;  Location: [facility] ORS;  Service: Pulmonary;  Laterality: N/A;  . THUMB ARTHROSCOPY Left     Family History  Problem Relation Age of Onset  . Diabetes Father   . Stroke Father   . Heart attack Father   . CAD Sister     Social History:  reports that she has been smoking Cigarettes.  She has a 40.00 pack-year smoking history. She has never used smokeless tobacco. She reports that she does not drink alcohol or use drugs.  He lives with her son and grandson. The patient is alone today.   Allergies:  Allergies  Allergen Reactions  . Contrast Media [Iodinated Diagnostic Agents] Other (See Comments)    Pt was sent to the ED following contrast media injection at [place]. Unknown reason. She has been premedicated since without complications. Pt to be premedicated prior to contrast media injections  . Ioxaglate Other (See Comments)    Pt  was sent to the ED following contrast media injection at [place]. Unknown reason. She has been premedicated since without complications. Pt to be premedicated prior to contrast media injections  . Sulfa Antibiotics     Other reaction(s): Other (See Comments)  . Tetracycline Hives and Other (See Comments)  . White Petrolatum Other (See Comments)  . Amoxicillin-Pot Clavulanate Rash    Other reaction(s): Unknown Other reaction(s): Unknown _0  Other reaction(s): Unknown Blisters in mouth Blisters in mouth  . Tape Rash    Current  Medications: Current Outpatient Prescriptions  Medication Sig Dispense Refill  . benzonatate (TESSALON) 200 MG capsule Take 1 capsule (200 mg total) by mouth 3 (three) times daily as needed for cough. 20 capsule 0  . Calcium-Vitamin D 600-200 MG-UNIT tablet Take 1 tablet by mouth daily.     . carvedilol (COREG) 12.5 MG tablet Take 1 tablet (12.5 mg total) by mouth 2 (two) times daily with a meal. 60 tablet 0  . cholecalciferol (VITAMIN D) 1000 units tablet Take 1,000 Units by mouth daily.    . clonazePAM (KLONOPIN) 1 MG tablet Take 1 mg by mouth 2 (two) times daily as needed for anxiety.     [name] dextromethorphan-guaiFENesin (TUSSIN DM) 10-100 MG/5ML liquid Take 5 mLs by mouth every 6 (six) hours as needed for cough. 237 mL 1  . gabapentin (NEURONTIN) 600 MG tablet Take 600 mg by mouth 3 (three) times daily.     [name] guaiFENesin-codeine 100-10 MG/5ML syrup Take 5 mLs by mouth 3 (three) times daily as needed for cough. 120 mL 0  . HYDROcodone-acetaminophen (NORCO/VICODIN) 5-325 MG tablet Take 1-2 tablets by mouth every 4 (four) hours as needed for moderate pain.     [name] imipramine (TOFRANIL) 25 MG tablet Take 1 tablet by mouth at bedtime.     . iron polysaccharides (FERREX 150) 150 MG capsule Take 150 mg by mouth daily.    [name] LORazepam (ATIVAN) 0.5 MG tablet Take 1 tablet (0.5 mg total) by mouth every 6 (six) hours as needed (Nausea or vomiting). 30 tablet 0  . MONUROL 3 g PACK Take 3 g by mouth one time only at 6 PM.    . Multiple Vitamin (MULTIVITAMIN) capsule Take 1 capsule by mouth daily.    . nitrofurantoin, macrocrystal-monohydrate, (MACROBID) 100 MG capsule Take 100 mg by mouth 2 (two) times daily.    . ondansetron (ZOFRAN) 8 MG tablet Take 1 tablet (8 mg total) by mouth 2 (two) times daily as needed for refractory nausea / vomiting. Start on day 3 after chemo. 30 tablet 1  . potassium chloride (K-DUR) 10 MEQ tablet Take 1 tablet (10 mEq total) by mouth daily. 10 tablet 0  . predniSONE (DELTASONE)  20 MG tablet Take 2 tablets (40 mg total) by mouth daily with breakfast. 10 days 20 tablet 0  . sucralfate (CARAFATE) 1 g tablet Take 1 tablet (1 g total) by mouth 3 (three) times daily. 90 tablet 6  . Tiotropium Bromide Monohydrate (SPIRIVA RESPIMAT) 2.5 MCG/ACT AERS Inhale 2.5 mcg into the lungs 2 (two) times daily. (Patient taking differently: Inhale 2.5 mcg into the lungs daily. ) 1 Inhaler 0  . traMADol (ULTRAM) 50 MG tablet Take 50 mg by mouth daily.     . vitamin C (ASCORBIC ACID) 500 MG tablet Take 500 mg by mouth daily.    [name] ZINC SULFATE PO Take 1 tablet by mouth  daily.     . zolpidem (AMBIEN) 5 MG tablet Take 5 mg by mouth at bedtime.     No current facility-administered medications for this visit.     Review of Systems:  GENERAL:  Feels "ok".  No fevers or sweats.  Weight down 3 pounds. PERFORMANCE STATUS (ECOG):  2 HEENT:  No visual changes, runny nose, sore throat, mouth sores or tenderness. Lungs: Shortness of breath (stable).  Cough.  No hemoptysis.  On oxygen 2 liter/min visa [place]. Cardiac:  No chest pain, palpitations, orthopnea, or PND. GI:  Diarrhea (see HPI).  No nausea, vomiting, constipation, melena or hematochezia. GU:  Continued UTI symptoms; frequency and back pain. No urgency, dysuria, or hematuria.  Musculoskeletal:  Fibromyalgia.  Osteoporosis.  No back pain.  No joint pain.  No muscle tenderness. Extremities:  Right hand throbbing x 3 days (see HPI).  No pain or swelling. Skin:  No rashes or skin changes. Neuro:  No headache, numbness or weakness, balance or coordination issues. Endocrine:  No diabetes, thyroid issues, hot flashes or night sweats. Psych:  No mood changes, depression or anxiety. Pain:  No focal pain. Review of systems:  All other systems reviewed and found to be negative.  Physical Exam: Blood pressure 113/73, pulse 92, temperature (!) 96.7 F (35.9 C), temperature source Tympanic, resp. rate 18, weight 110 lb 4 oz (50 kg). O2 sats  95%. GENERAL:  Thin woman sitting comfortably in a wheelchair in the exam room in no acute distress. MENTAL STATUS:  Alert and oriented to person, place and time. HEAD:  Short blonde hair.  Normocephalic, atraumatic, face symmetric, no Cushingoid features. EYES:  Blue eyes.  Pupils equal round and reactive to light and accomodation.  No conjunctivitis or scleral icterus. ENT:  Oropharynx clear without lesion.  Tongue normal. Mucous membranes moist.  RESPIRATORY:  Clear to auscultation.  No rales, wheezes or rhonchi. CARDIOVASCULAR:  Regular rate and rhythm without murmur, rub or gallop. ABDOMEN:  Soft, non tender with active bowel sounds and no hepatosplenomegaly.  No masses. SKIN:  No rashes, ulcers or lesions. EXTREMITIES: Arthritic changes in hands.  Right hand bruising.  Ankle edema.  No skin discoloration or tenderness.  No palpable cords. LYMPH NODES: No palpable cervical, supraclavicular, axillary or inguinal adenopathy  NEUROLOGICAL: Unremarkable. PSYCH:  Appropriate.   Appointment on 09/23/2016  Component Date Value Ref Range Status  . WBC 09/23/2016 2.8* 3.6 - 11.0 K/uL Final  . RBC 09/23/2016 3.61* 3.80 - 5.20 MIL/uL Final  . Hemoglobin 09/23/2016 11.2* 12.0 - 16.0 g/dL Final  . HCT 09/23/2016 31.9* 35.0 - 47.0 % Final  . MCV 09/23/2016 88.5  80.0 - 100.0 fL Final  . MCH 09/23/2016 31.0  26.0 - 34.0 pg Final  . MCHC 09/23/2016 35.0  32.0 - 36.0 g/dL Final  . RDW 09/23/2016 17.9* 11.5 - 14.5 % Final  . Platelets 09/23/2016 158  150 - 440 K/uL Final  . Neutrophils Relative % 09/23/2016 60  % Final  . Neutro Abs 09/23/2016 1.7  1.4 - 6.5 K/uL Final  . Lymphocytes Relative 09/23/2016 21  % Final  . Lymphs Abs 09/23/2016 0.6* 1.0 - 3.6 K/uL Final  . Monocytes Relative 09/23/2016 10  % Final  . Monocytes Absolute 09/23/2016 0.3  0.2 - 0.9 K/uL Final  . Eosinophils Relative 09/23/2016 7  % Final  . Eosinophils Absolute 09/23/2016 0.2  0 - 0.7 K/uL Final  . Basophils Relative  09/23/2016 2  % Final  . Basophils  Absolute 09/23/2016 0.1  0 - 0.1 K/uL Final  . Sodium 09/23/2016 133* 135 - 145 mmol/L Final  . Potassium 09/23/2016 4.5  3.5 - 5.1 mmol/L Final  . Chloride 09/23/2016 102  101 - 111 mmol/L Final  . CO2 09/23/2016 27  22 - 32 mmol/L Final  . Glucose, Bld 09/23/2016 102* 65 - 99 mg/dL Final  . BUN 09/23/2016 16  6 - 20 mg/dL Final  . Creatinine, Ser 09/23/2016 1.11* 0.44 - 1.00 mg/dL Final  . Calcium 09/23/2016 9.2  8.9 - 10.3 mg/dL Final  . Total Protein 09/23/2016 6.8  6.5 - 8.1 g/dL Final  . Albumin 09/23/2016 3.5  3.5 - 5.0 g/dL Final  . AST 09/23/2016 26  15 - 41 U/L Final  . ALT 09/23/2016 16  14 - 54 U/L Final  . Alkaline Phosphatase 09/23/2016 100  38 - 126 U/L Final  . Total Bilirubin 09/23/2016 0.5  0.3 - 1.2 mg/dL Final  . GFR calc non Af Amer 09/23/2016 48* >60 mL/min Final  . GFR calc Af Amer 09/23/2016 56* >60 mL/min Final   Comment: (NOTE) The eGFR has been calculated using the CKD EPI equation. This calculation has not been validated in all clinical situations. eGFR's persistently <60 mL/min signify possible Chronic Kidney Disease.   . Anion gap 09/23/2016 4* 5 - 15 Final  . Magnesium 09/23/2016 1.8  1.7 - 2.4 mg/dL Final    Assessment:  [name] [name] is a 76 y.o. female with a clinical T2bNxM0 sqamous cell lung cancer of the left lung s/p bronchoscopy and biopsy on 07/01/2016.  She has a 40 pack year smoking history.  She presented with left lower chest wall pain.  Chest CT with contrast on 06/11/2016 revealed a 3.2 x 3.0 mass like area of focal opacity in the medial left lower lobe with obliteration of segmental airways to the anterior left lower lobe.  PET scan on 06/27/2016 revealed a 4.3 x 3.2 cm central left lower lobe lung lesion (SUV 14.3) consistent with primary bronchogenic carcinoma.  There was equivocal nodal tissue in the subcarinal station demonstrating mild hypermetabolism (SUV 3.6). There was more peripheral left lower  lobe increased atelectasis with mucoid impaction which is likely secondary to endobronchial obstruction.  There was a small left pleural effusion.  Head MRI on 07/07/2016 revealed no evidence of metastatic disease.  She received radiation from 07/22/2016 - 09/19/2016.  She is s/p week #5 concurrent carboplatin and Taxol (07/24/2016 - 08/11/2016; 09/05/2016 - 09/16/2016). She requires a reduced dose of Benadryl (25 mg) for her premedication.  Week #3 was postponed secondary to chest pain and evaluation.  She missed a couple of weeks.  She has a normocytic anemia likely due to chemotherapy.  Work-up on 07/24/2016 revealed the following normal labs: ferritin (103), B12 (345), folate(19.7).  Iron saturation was 8% and TIBC was 258.  She has a history of stage IA left breast cancer in 02/2005.  She underwent lumpectomy.  Pathology revealed a T1cN0 lesion.  Tumor was ER + , PR +, and Her2/neu -.  Two sentinel lymph nodes were negative.  She received chemotherapy (4 cycles of AC and possibly an abbreviated course of Taxol- no records available).  She received radiation.  She completed Femara in 08/2010.  She has fibromyalgia.  She has advanced thoracoabdominal aortic atherosclerosis with fusiform dilatation of the descending thoracic aorta up to 4.3 cm.  She is followed by Dr Lucky Cowboy.  Symptomatically, patient with chronic cough, diarrhea, persistent UTI  symptoms, and some tingling in her fingers.  Exam is stable. WBC is 2800 with an [place] of 1700. Hemoglobin 11.2, hematocrit 31.9, and platelets 158,000. Potassium 4.5 despite diarrhea.   Plan: 1.  Labs today:  CBC with diff, CMP, Mg.  2.  Discuss concern for ongoing diarrhea.  Stool for C.diff and GI panel by PCR. 3.  Discuss treatment options going forward. Reviewed plans for f/u CT scan in 1 month followed by Imfinzi.  4.  No chemotherapy today due to borderline blood counts, continued urinary symptoms, and persistent diarrhea.  Radiation is complete. 5.  CT  chest in 1 month.  6.  RTC in 1 month (after CT) for MD assessment, labs (CBC with diff, CMP, Mg), review results, and initiation of Imfinzi.   Honor Loh, NP  09/23/2016, 9:13 AM   I saw and evaluated the patient, participating in the key portions of the service and reviewing pertinent diagnostic studies and records.  I reviewed the nurse practitioner's note and agree with the findings and the plan.  The assessment and plan were discussed with the patient.  Multiple questions were asked by the patient and answered.   Lequita Asal, MD 09/23/2016

## 2016-09-23 NOTE — Progress Notes (Signed)
Patient states she is having diarrhea when she eats.  States she does not feel the best today.

## 2016-09-25 ENCOUNTER — Ambulatory Visit: Payer: Medicare HMO | Admitting: Internal Medicine

## 2016-10-07 DIAGNOSIS — I10 Essential (primary) hypertension: Secondary | ICD-10-CM | POA: Diagnosis not present

## 2016-10-07 DIAGNOSIS — J449 Chronic obstructive pulmonary disease, unspecified: Secondary | ICD-10-CM | POA: Diagnosis not present

## 2016-10-07 DIAGNOSIS — C3432 Malignant neoplasm of lower lobe, left bronchus or lung: Secondary | ICD-10-CM | POA: Diagnosis not present

## 2016-10-07 DIAGNOSIS — I509 Heart failure, unspecified: Secondary | ICD-10-CM | POA: Diagnosis not present

## 2016-10-16 DIAGNOSIS — I5022 Chronic systolic (congestive) heart failure: Secondary | ICD-10-CM | POA: Diagnosis not present

## 2016-10-16 DIAGNOSIS — R0602 Shortness of breath: Secondary | ICD-10-CM | POA: Diagnosis not present

## 2016-10-23 ENCOUNTER — Ambulatory Visit: Payer: Medicare HMO

## 2016-10-27 ENCOUNTER — Other Ambulatory Visit: Payer: Self-pay | Admitting: Hematology and Oncology

## 2016-10-27 ENCOUNTER — Telehealth: Payer: Self-pay | Admitting: *Deleted

## 2016-10-27 ENCOUNTER — Ambulatory Visit
Admission: RE | Admit: 2016-10-27 | Discharge: 2016-10-27 | Disposition: A | Discharge status: Home / Self Care | Payer: Medicare HMO | Source: Ambulatory Visit | Attending: Radiation Oncology | Admitting: Radiation Oncology

## 2016-10-27 ENCOUNTER — Encounter: Payer: Self-pay | Admitting: Radiation Oncology

## 2016-10-27 VITALS — BP 96/61 | HR 89 | Temp 97.1°F | Resp 18 | Wt 114.1 lb

## 2016-10-27 DIAGNOSIS — R918 Other nonspecific abnormal finding of lung field: Secondary | ICD-10-CM

## 2016-10-27 DIAGNOSIS — C3432 Malignant neoplasm of lower lobe, left bronchus or lung: Secondary | ICD-10-CM | POA: Insufficient documentation

## 2016-10-27 DIAGNOSIS — Z923 Personal history of irradiation: Secondary | ICD-10-CM | POA: Insufficient documentation

## 2016-10-27 NOTE — Telephone Encounter (Signed)
Patient agrees to appointment tomorrow at 11 AM

## 2016-10-27 NOTE — Progress Notes (Signed)
Radiation Oncology Follow up Note  Name: [name] [name]   Date:   10/27/2016 MRN:  [id number] DOB: [birth year]    This 75 y.o. female presents to the clinic today for one-month follow-up status post external beam radiation therapy to her left lower lobe for non-small cell lung cancer.  REFERRING PROVIDER: Tracie Harrier, MD  HPI: Patient is a 76 year old female now out 1 month having completed radiation therapy to her left lower lobe for non-small cell lung cancer. She is seen today in routine follow-up and is doing fair she stage she continues have a mild productive cough for clear. She is also recovering from a UTI.. She did receive concurrent carboplatinum and Taxol with radiation. She does have a history of fibroma myalgia. She does have a history of normocytic anemia likely due to chemotherapy. She was scheduled for CT scan last week although that was postponed for the weather will be repeated this week which I will review when it becomes available.  COMPLICATIONS OF TREATMENT: none  FOLLOW UP COMPLIANCE: keeps appointments   PHYSICAL EXAM:  BP 96/61   Pulse 89   Temp (!) 97.1 F (36.2 C)   Resp 18   Wt 114 lb 1.4 oz (51.8 kg)   BMI 20.87 kg/m  Well-developed well-nourished patient in NAD. HEENT reveals PERLA, EOMI, discs not visualized.  Oral cavity is clear. No oral mucosal lesions are identified. Neck is clear without evidence of cervical or supraclavicular adenopathy. Lungs are clear to A&P. Cardiac examination is essentially unremarkable with regular rate and rhythm without murmur rub or thrill. Abdomen is benign with no organomegaly or masses noted. Motor sensory and DTR levels are equal and symmetric in the upper and lower extremities. Cranial nerves II through XII are grossly intact. Proprioception is intact. No peripheral adenopathy or edema is identified. No motor or sensory levels are noted. Crude visual fields are within normal range.  RADIOLOGY RESULTS: CT scan to be  reviewed when it is performed this week.  PLAN: Present time patient is stable. I will review her CT scan when it becomes available this week. Otherwise she continues close follow-up care and continue possible chemotherapy with medical oncology. I have asked to see her back in 3-4 months for follow-up. Patient knows to call with any concerns.  I would like to take this opportunity to thank you for allowing me to participate in the care of your patient.Armstead Peaks., MD

## 2016-10-27 NOTE — Progress Notes (Signed)
DISCONTINUE ON PATHWAY REGIMEN - Non-Small Cell Lung     Administer weekly:     Paclitaxel      Carboplatin   **Always confirm dose/schedule in your pharmacy ordering system**    REASON: Continuation Of Treatment PRIOR TREATMENT: [id number]: Carboplatin AUC=2 + Paclitaxel 45 mg/m2 Weekly During Radiation TREATMENT RESPONSE: Unable to Evaluate  START ON PATHWAY REGIMEN - Non-Small Cell Lung     A cycle is every 14 days:     Durvalumab   **Always confirm dose/schedule in your pharmacy ordering system**    Patient Characteristics: Stage III - Unresectable, PS = 0, 1 AJCC T Category: T2b Current Disease Status: No Distant Mets or Local Recurrence AJCC N Category: N2 AJCC M Category: M0 AJCC 8 Stage Grouping: IIIA Performance Status: PS = 0, 1 Intent of Therapy: Curative Intent, Discussed with Patient

## 2016-10-27 NOTE — Telephone Encounter (Signed)
  Patient needs to go to symptom management clinic.  M

## 2016-10-27 NOTE — Telephone Encounter (Signed)
Patient called requesting antibiotics for a cough and congestion she has had for several days with low grade fever up to 100.6. Afibrile in office this morning when seen in radiation therapy 97.1 She reports that her ribs are sore form all the coughing. She is producing yellow mucous now as well, it had been white.  Please advise

## 2016-10-28 ENCOUNTER — Ambulatory Visit
Admission: RE | Admit: 2016-10-28 | Discharge: 2016-10-28 | Disposition: A | Discharge status: Home / Self Care | Payer: Medicare HMO | Source: Ambulatory Visit | Attending: Oncology | Admitting: Oncology

## 2016-10-28 ENCOUNTER — Other Ambulatory Visit: Payer: Self-pay | Admitting: Hematology and Oncology

## 2016-10-28 ENCOUNTER — Inpatient Hospital Stay: Payer: Medicare HMO | Admitting: Hematology and Oncology

## 2016-10-28 ENCOUNTER — Inpatient Hospital Stay: Payer: Medicare HMO

## 2016-10-28 ENCOUNTER — Inpatient Hospital Stay: Payer: Medicare HMO | Attending: Oncology | Admitting: Oncology

## 2016-10-28 VITALS — BP 109/73 | HR 91 | Temp 98.1°F | Resp 20 | Wt 112.5 lb

## 2016-10-28 DIAGNOSIS — I700 Atherosclerosis of aorta: Secondary | ICD-10-CM | POA: Insufficient documentation

## 2016-10-28 DIAGNOSIS — Z9181 History of falling: Secondary | ICD-10-CM | POA: Diagnosis not present

## 2016-10-28 DIAGNOSIS — R5383 Other fatigue: Secondary | ICD-10-CM

## 2016-10-28 DIAGNOSIS — Z923 Personal history of irradiation: Secondary | ICD-10-CM | POA: Insufficient documentation

## 2016-10-28 DIAGNOSIS — C3432 Malignant neoplasm of lower lobe, left bronchus or lung: Secondary | ICD-10-CM | POA: Diagnosis not present

## 2016-10-28 DIAGNOSIS — J189 Pneumonia, unspecified organism: Secondary | ICD-10-CM | POA: Insufficient documentation

## 2016-10-28 DIAGNOSIS — I110 Hypertensive heart disease with heart failure: Secondary | ICD-10-CM

## 2016-10-28 DIAGNOSIS — R509 Fever, unspecified: Secondary | ICD-10-CM | POA: Diagnosis present

## 2016-10-28 DIAGNOSIS — J918 Pleural effusion in other conditions classified elsewhere: Secondary | ICD-10-CM | POA: Insufficient documentation

## 2016-10-28 DIAGNOSIS — J9811 Atelectasis: Secondary | ICD-10-CM | POA: Diagnosis not present

## 2016-10-28 DIAGNOSIS — Z881 Allergy status to other antibiotic agents status: Secondary | ICD-10-CM | POA: Insufficient documentation

## 2016-10-28 DIAGNOSIS — R52 Pain, unspecified: Secondary | ICD-10-CM | POA: Diagnosis not present

## 2016-10-28 DIAGNOSIS — Z87440 Personal history of urinary (tract) infections: Secondary | ICD-10-CM | POA: Insufficient documentation

## 2016-10-28 DIAGNOSIS — I509 Heart failure, unspecified: Secondary | ICD-10-CM | POA: Diagnosis not present

## 2016-10-28 DIAGNOSIS — J440 Chronic obstructive pulmonary disease with acute lower respiratory infection: Secondary | ICD-10-CM | POA: Insufficient documentation

## 2016-10-28 DIAGNOSIS — R531 Weakness: Secondary | ICD-10-CM | POA: Diagnosis not present

## 2016-10-28 DIAGNOSIS — Z853 Personal history of malignant neoplasm of breast: Secondary | ICD-10-CM | POA: Insufficient documentation

## 2016-10-28 DIAGNOSIS — R634 Abnormal weight loss: Secondary | ICD-10-CM | POA: Diagnosis not present

## 2016-10-28 DIAGNOSIS — Z79899 Other long term (current) drug therapy: Secondary | ICD-10-CM | POA: Diagnosis not present

## 2016-10-28 DIAGNOSIS — Z170 Estrogen receptor positive status [ER+]: Secondary | ICD-10-CM | POA: Insufficient documentation

## 2016-10-28 DIAGNOSIS — Z9981 Dependence on supplemental oxygen: Secondary | ICD-10-CM | POA: Diagnosis not present

## 2016-10-28 DIAGNOSIS — M069 Rheumatoid arthritis, unspecified: Secondary | ICD-10-CM | POA: Insufficient documentation

## 2016-10-28 DIAGNOSIS — D649 Anemia, unspecified: Secondary | ICD-10-CM | POA: Insufficient documentation

## 2016-10-28 DIAGNOSIS — R5381 Other malaise: Secondary | ICD-10-CM | POA: Diagnosis not present

## 2016-10-28 DIAGNOSIS — R0602 Shortness of breath: Secondary | ICD-10-CM | POA: Diagnosis not present

## 2016-10-28 DIAGNOSIS — R05 Cough: Secondary | ICD-10-CM

## 2016-10-28 DIAGNOSIS — J449 Chronic obstructive pulmonary disease, unspecified: Secondary | ICD-10-CM

## 2016-10-28 DIAGNOSIS — Z9221 Personal history of antineoplastic chemotherapy: Secondary | ICD-10-CM | POA: Insufficient documentation

## 2016-10-28 DIAGNOSIS — R059 Cough, unspecified: Secondary | ICD-10-CM

## 2016-10-28 DIAGNOSIS — J90 Pleural effusion, not elsewhere classified: Secondary | ICD-10-CM | POA: Diagnosis not present

## 2016-10-28 DIAGNOSIS — Z9223 Personal history of estrogen therapy: Secondary | ICD-10-CM | POA: Insufficient documentation

## 2016-10-28 DIAGNOSIS — M797 Fibromyalgia: Secondary | ICD-10-CM | POA: Diagnosis not present

## 2016-10-28 DIAGNOSIS — F17210 Nicotine dependence, cigarettes, uncomplicated: Secondary | ICD-10-CM | POA: Diagnosis not present

## 2016-10-28 DIAGNOSIS — Z792 Long term (current) use of antibiotics: Secondary | ICD-10-CM | POA: Insufficient documentation

## 2016-10-28 MED ORDER — LEVOFLOXACIN 250 MG PO TABS: ORAL_TABLET | ORAL | 0 refills | Status: DC

## 2016-10-28 MED ORDER — LEVOFLOXACIN 500 MG PO TABS: 500.0000 mg | ORAL_TABLET | Freq: Every day | ORAL | 0 refills | Status: DC

## 2016-10-28 MED ORDER — LEVOFLOXACIN 750 MG PO TABS: 750.0000 mg | ORAL_TABLET | Freq: Every day | ORAL | 0 refills | Status: DC

## 2016-10-28 MED ORDER — BENZONATATE 200 MG PO CAPS: 200.0000 mg | ORAL_CAPSULE | Freq: Three times a day (TID) | ORAL | 0 refills | Status: DC | PRN

## 2016-10-28 NOTE — Progress Notes (Signed)
Pt called and was having chest congestion, increased SOB, weakness and no appetite.  Reports coughing up thick yellow sputum for past 3 days.

## 2016-10-28 NOTE — Progress Notes (Signed)
Symptom Management Consult note [facility]  Telephone:([phone] [phone]  Patient Care Team: Tracie Harrier, MD as PCP - General (Internal Medicine) Noreene Filbert, MD as Referring Physician (Radiation Oncology) Telford Nab, RN as Registered Nurse   Name of the patient: [name] [name]  [id number]  08/01/1940   Date of visit: 10/28/16  Diagnosis- clinical stage IIIA squamous cell carcinoma of the left lower lobe   Chief complaint/ Reason for visit- Fever/Cough/Congestion  Heme/Onc history: BO ROGUE is a 76 y.o. female with a clinical T2bNxM0 sqamous cell lung cancer of the left lung s/p bronchoscopy and biopsy on 07/01/2016.  She has a 40 pack year smoking history.  She presented with left lower chest wall pain.  Chest CT with contrast on 06/11/2016 revealed a 3.2 x 3.0 mass like area of focal opacity in the medial left lower lobe with obliteration of segmental airways to the anterior left lower lobe.  PET scan on 06/27/2016 revealed a 4.3 x 3.2 cm central left lower lobe lung lesion (SUV 14.3) consistent with primary bronchogenic carcinoma.  There was equivocal nodal tissue in the subcarinal station demonstrating mild hypermetabolism (SUV 3.6). There was more peripheral left lower lobe increased atelectasis with mucoid impaction which is likely secondary to endobronchial obstruction.  There was a small left pleural effusion.  Head MRI on 07/07/2016 revealed no evidence of metastatic disease.  She received radiation from 07/22/2016 - 09/19/2016.  She is s/p week #5 concurrent carboplatin and Taxol (07/24/2016 - 08/11/2016; 09/05/2016 - 09/16/2016). She requires a reduced dose of Benadryl (25 mg) for her premedication.  Week #3 was postponed secondary to chest pain and evaluation.  She missed a couple of weeks.  She has a normocytic anemia likely due to chemotherapy.  Work-up on 07/24/2016 revealed the following normal labs:  ferritin (103), B12 (345), folate(19.7).  Iron saturation was 8% and TIBC was 258.  She has a history of stage IA left breast cancer in 02/2005.  She underwent lumpectomy.  Pathology revealed a T1cN0 lesion.  Tumor was ER + , PR +, and Her2/neu -.  Two sentinel lymph nodes were negative.  She received chemotherapy (4 cycles of AC and possibly an abbreviated course of Taxol- no records available).  She received radiation.  She completed Femara in 08/2010.  She has fibromyalgia.  She has advanced thoracoabdominal aortic atherosclerosis with fusiform dilatation of the descending thoracic aorta up to 4.3 cm.  She is followed by Dr Lucky Cowboy.  Interval history- Patient was last seen by Dr. Mike Gip on 09/23/2016 where she experienced some numbness and tingling in her fingers and throbbing to the dorsal aspect of her right hand. The patient continued to complain of urinary symptoms which she was treated with 2 rounds of antibiotics in the month prior. Her repeat UA and urine culture was negative. She also complained of continued diarrhea when she ate that was persistent for the past month. She admitted to only eating one to 2 times per day and had lost 3 pounds since her last visit. She had just completed radiation on 09/19/2016. Er chemotherapy was held due to borderline blood results, urinary symptoms and persistent diarrhea. She was to follow-up with repeat CT scan in 1 month. She was then going to initiate imfinzi.   Today, she presents for cough and congestion for the past few days with a fever as high as 101.6  on Sunday. Patient states that approximately 2 weeks ago she was treated for a  urinary tract infection with antibiotics and has not felt great ever since. A few days ago she developed a cough, congestion and a low-grade fever. The cough is constant. She has tried Robitussin OTC with minimal relief. She does admit to occasional sputum production with small amounts of bright red tinged sputum. Her highest  temp was on Sunday (101.6). She gave herself Tylenol and has not had a fever since. She denies any pain other than the pain caused from coughing. She additionally complains of feeling weak and  fatigued. She states she is drinking plenty of fluids and eating despite a 3 pound weight loss.  ECOG FS:1 - Symptomatic but completely ambulatory  Review of systems- Review of Systems  Constitutional: Positive for chills, fever, malaise/fatigue and weight loss.  HENT: Negative.   Eyes: Negative.   Respiratory: Positive for cough, sputum production and shortness of breath.   Cardiovascular: Negative for chest pain.  Gastrointestinal: Negative for constipation, diarrhea, nausea and vomiting.  Genitourinary: Negative.  Negative for dysuria, frequency and urgency.  Musculoskeletal: Negative.   Skin: Negative.   Neurological: Positive for weakness.  Endo/Heme/Allergies: Negative.   Psychiatric/Behavioral: Negative.      Current treatment- Imfinizi after CT scan scheduled for 10/30/16.   Allergies  Allergen Reactions  . Contrast Media [Iodinated Diagnostic Agents] Other (See Comments)    Pt was sent to the ED following contrast media injection at [place]. Unknown reason. She has been premedicated since without complications. Pt to be premedicated prior to contrast media injections  . Ioxaglate Other (See Comments)    Pt was sent to the ED following contrast media injection at [place]. Unknown reason. She has been premedicated since without complications. Pt to be premedicated prior to contrast media injections  . Sulfa Antibiotics     Other reaction(s): Other (See Comments)  . Tetracycline Hives and Other (See Comments)  . White Petrolatum Other (See Comments)  . Amoxicillin-Pot Clavulanate Rash    Other reaction(s): Unknown Other reaction(s): Unknown _0  Other reaction(s):  Unknown Blisters in mouth Blisters in mouth  . Tape Rash     Past Medical History:  Diagnosis Date  . Asthma   . Breast cancer ([place]) 2009   left  . Cancer ([place])   . CHF (congestive heart failure) ([place])   . CHF (congestive heart failure) ([place])   . Chronic UTI   . COPD (chronic obstructive pulmonary disease) ([place])   . Dizziness   . Fibromyalgia   . Hypertension   . Neuropathy   . Personal history of tobacco use, presenting hazards to health 05/17/2015  . Polyp, larynx   . RA (rheumatoid arthritis) ([place])   . Sinus infection    recent  . Stumbling gait    to the left  . Supplemental oxygen dependent    2.5l     Past Surgical History:  Procedure Laterality Date  . ABDOMINAL HYSTERECTOMY    . BREAST LUMPECTOMY Left 2009   chemo and radiation  . CYST EXCISION Left 02/27/2015   Procedure: CYST REMOVAL;  Surgeon: Hessie Knows, MD;  Location: [facility] ORS;  Service: Orthopedics;  Laterality: Left;  . EYE MUSCLE SURGERY Right    13 surgeries  . FLEXIBLE BRONCHOSCOPY N/A 07/01/2016   Procedure: FLEXIBLE BRONCHOSCOPY;  Surgeon: Wilhelmina Mcardle, MD;  Location: [facility] ORS;  Service: Pulmonary;  Laterality: N/A;  . THUMB ARTHROSCOPY Left     Social History  Social History  . Marital status: Divorced    Spouse name: N/A  . Number of children: N/A  . Years of education: N/A   Occupational History  . Not on file.   Social History Main Topics  . Smoking status: Current Every Day Smoker    Packs/day: 1.00    Years: 40.00    Types: Cigarettes  . Smokeless tobacco: Never Used  . Alcohol use No  . Drug use: No  . Sexual activity: Not on file   Other Topics Concern  . Not on file   Social History Narrative  . No narrative on file    Family History  Problem Relation Age of Onset  . Diabetes Father   . Stroke Father   . Heart attack Father   . CAD Sister      Current Outpatient Prescriptions:  .  Calcium-Vitamin D 600-200 MG-UNIT tablet, Take 1 tablet by mouth daily.  , Disp: , Rfl:  .  carvedilol (COREG) 12.5 MG tablet, Take 1 tablet (12.5 mg total) by mouth 2 (two) times daily with a meal., Disp: 60 tablet, Rfl: 0 .  cholecalciferol (VITAMIN D) 1000 units tablet, Take 1,000 Units by mouth daily., Disp: , Rfl:  .  clonazePAM (KLONOPIN) 1 MG tablet, Take 1 mg by mouth 2 (two) times daily as needed for anxiety. , Disp: , Rfl:  .  gabapentin (NEURONTIN) 600 MG tablet, Take 600 mg by mouth 3 (three) times daily. , Disp: , Rfl:  .  imipramine (TOFRANIL) 25 MG tablet, Take 1 tablet by mouth at bedtime. , Disp: , Rfl:  .  iron polysaccharides (FERREX 150) 150 MG capsule, Take 150 mg by mouth daily., Disp: , Rfl:  .  MONUROL 3 g PACK, Take 3 g by mouth one time only at 6 PM., Disp: , Rfl:  .  Multiple Vitamin (MULTIVITAMIN) capsule, Take 1 capsule by mouth daily., Disp: , Rfl:  .  ondansetron (ZOFRAN) 8 MG tablet, , Disp: , Rfl:  .  potassium chloride (K-DUR) 10 MEQ tablet, Take 1 tablet (10 mEq total) by mouth daily., Disp: 10 tablet, Rfl: 0 .  vitamin C (ASCORBIC ACID) 500 MG tablet, Take 500 mg by mouth daily., Disp: , Rfl:  .  ZINC SULFATE PO, Take 1 tablet by mouth daily. , Disp: , Rfl:  .  zolpidem (AMBIEN) 5 MG tablet, Take 5 mg by mouth at bedtime., Disp: , Rfl:  .  benzonatate (TESSALON) 200 MG capsule, Take 1 capsule (200 mg total) by mouth 3 (three) times daily as needed for cough., Disp: 20 capsule, Rfl: 0 .  dextromethorphan-guaiFENesin (TUSSIN DM) 10-100 MG/5ML liquid, Take 5 mLs by mouth every 6 (six) hours as needed for cough. (Patient not taking: Reported on 10/28/2016), Disp: 237 mL, Rfl: 1 .  HYDROcodone-acetaminophen (NORCO/VICODIN) 5-325 MG tablet, Take 1-2 tablets by mouth every 4 (four) hours as needed for moderate pain. , Disp: , Rfl:  .  levofloxacin (LEVAQUIN) 250 MG tablet, Take two tablets on day 1 (565m)  and one tablet (250 mg) for nine days for a total of 10 days., Disp: 11 tablet, Rfl: 0 .  nitrofurantoin, macrocrystal-monohydrate,  (MACROBID) 100 MG capsule, Take 100 mg by mouth 2 (two) times daily., Disp: , Rfl:  .  predniSONE (DELTASONE) 20 MG tablet, Take 2 tablets (40 mg total) by mouth daily with breakfast. 10 days (Patient not taking: Reported on 10/28/2016), Disp: 20 tablet, Rfl: 0 .  sucralfate (CARAFATE) 1 g tablet, Take  1 tablet (1 g total) by mouth 3 (three) times daily. (Patient not taking: Reported on 10/28/2016), Disp: 90 tablet, Rfl: 6 .  Tiotropium Bromide Monohydrate (SPIRIVA RESPIMAT) 2.5 MCG/ACT AERS, Inhale 2.5 mcg into the lungs 2 (two) times daily. (Patient taking differently: Inhale 2.5 mcg into the lungs daily. ), Disp: 1 Inhaler, Rfl: 0 .  traMADol (ULTRAM) 50 MG tablet, Take 50 mg by mouth daily. , Disp: , Rfl:   Physical exam:  Vitals:   10/28/16 1128  BP: 109/73  Pulse: 91  Resp: 20  Temp: 98.1 F (36.7 C)  TempSrc: Tympanic  Weight: 112 lb 8 oz (51 kg)   Physical Exam  Constitutional: She is oriented to person, place, and time and well-developed, well-nourished, and in no distress.  HENT:  Head: Normocephalic and atraumatic.  Eyes: Pupils are equal, round, and reactive to light.  Neck: Normal range of motion. Neck supple.  Cardiovascular: Normal rate, regular rhythm and normal heart sounds.   Pulmonary/Chest: Effort normal. She has wheezes.  Abdominal: Soft. Bowel sounds are normal.  Musculoskeletal: Normal range of motion.  Neurological: She is alert and oriented to person, place, and time.  Skin: Skin is warm and dry.  Psychiatric: Mood, memory, affect and judgment normal.     CMP Latest Ref Rng & Units 09/23/2016  Glucose 65 - 99 mg/dL 102(H)  BUN 6 - 20 mg/dL 16  Creatinine 0.44 - 1.00 mg/dL 1.11(H)  Sodium 135 - 145 mmol/L 133(L)  Potassium 3.5 - 5.1 mmol/L 4.5  Chloride 101 - 111 mmol/L 102  CO2 22 - 32 mmol/L 27  Calcium 8.9 - 10.3 mg/dL 9.2  Total Protein 6.5 - 8.1 g/dL 6.8  Total Bilirubin 0.3 - 1.2 mg/dL 0.5  Alkaline Phos 38 - 126 U/L 100  AST 15 - 41 U/L 26   ALT 14 - 54 U/L 16   CBC Latest Ref Rng & Units 09/23/2016  WBC 3.6 - 11.0 K/uL 2.8(L)  Hemoglobin 12.0 - 16.0 g/dL 11.2(L)  Hematocrit 35.0 - 47.0 % 31.9(L)  Platelets 150 - 440 K/uL 158    No images are attached to the encounter.  Dg Chest 2 View  Result Date: 10/28/2016 CLINICAL DATA:  Off and chest congestion for the past 2 weeks with fever. History of COPD -asthma, current smoker, CHF. EXAM: CHEST  2 VIEW COMPARISON:  Chest x-ray of August 07, 2016 FINDINGS: The right lung is mildly hyperinflated. There is a tiny right pleural effusion. On the left there is a small pleural effusion. There is left basilar atelectasis or infiltrate. The heart and pulmonary vascularity are normal. There is calcification in the wall of the aortic arch. There is mild multilevel degenerative disc disease of the thoracic spine. IMPRESSION: Left lower lobe atelectasis or pneumonia with small left pleural effusion. Tiny right pleural effusion. Underlying COPD. No overt CHF. Followup PA and lateral chest X-ray is recommended in 3-4 weeks following trial of antibiotic therapy to ensure resolution and exclude underlying malignancy. Thoracic aortic atherosclerosis. Electronically Signed   By: David  Martinique M.D.   On: 10/28/2016 12:18     Assessment and plan- Patient is a 76 y.o. female who presents with a cough, congestion and fever. Left lower lung wheezing. Right lung diminished. Patient coughs several times during exam. She is afebrile. Vital signs stable.    1. Fever/SOB/Weakness: STAT Chest X-ray. Unable to move CT Scan up. Results: Left lower lobe atelectasis or pneumonia with small left pleural effusion. Tiny right pleural effusion.  Underlying COPD. No overt CHF. Followup PA and lateral chest X-ray is recommended in 3-4 weeks following trial of antibiotic therapy to ensure resolution and exclude underlying malignancy. RX Levaquin 500 mg daily for day 1, then 250 mg daily for a total of 10 days. Given the patient's  allergies and the fact that she was recently treated for a UTI within the past month, I will treat her with a respiratory flouroquinolone. Patient was educated on side effects of fluoroquinolones including QT prolongation, tendon rupture, hypoglycemia, CNS changes and hepatotoxcity.Patient states she has taken Levaquin before without any issues. Patient CrCl is 38. Liver enzymes ok. She is dose reduced accordingly.  2. Cough: RX Benozonatate 200 mg TID PRN.   3. RTC as scheduled for CT scan on Thursday and on 11/04/16 to see Dr. Mike Gip.   Visit Diagnosis 1. Fever, unspecified fever cause   2. Cough in adult   3. Shortness of breath    Greater than 50% of the 25 minute visit was spent on counseling/coordinationof care for this patient.  Patient expressed understanding and was in agreement with this plan. She also understands that She can call clinic at any time with any questions, concerns, or complaints.    Marisue Humble [facility] at [facility] Pager- [phone] 10/29/2016 8:43 AM

## 2016-10-30 ENCOUNTER — Ambulatory Visit: Payer: Medicare HMO | Attending: Urgent Care

## 2016-11-04 ENCOUNTER — Other Ambulatory Visit: Payer: Self-pay | Admitting: *Deleted

## 2016-11-04 ENCOUNTER — Inpatient Hospital Stay (HOSPITAL_BASED_OUTPATIENT_CLINIC_OR_DEPARTMENT_OTHER): Payer: Medicare HMO | Admitting: Hematology and Oncology

## 2016-11-04 ENCOUNTER — Encounter: Payer: Self-pay | Admitting: Hematology and Oncology

## 2016-11-04 ENCOUNTER — Inpatient Hospital Stay: Payer: Medicare HMO

## 2016-11-04 VITALS — BP 104/59 | HR 85 | Temp 96.1°F | Resp 18 | Wt 113.9 lb

## 2016-11-04 DIAGNOSIS — J440 Chronic obstructive pulmonary disease with acute lower respiratory infection: Secondary | ICD-10-CM | POA: Diagnosis not present

## 2016-11-04 DIAGNOSIS — Z923 Personal history of irradiation: Secondary | ICD-10-CM

## 2016-11-04 DIAGNOSIS — D649 Anemia, unspecified: Secondary | ICD-10-CM

## 2016-11-04 DIAGNOSIS — R52 Pain, unspecified: Secondary | ICD-10-CM | POA: Diagnosis not present

## 2016-11-04 DIAGNOSIS — Z79899 Other long term (current) drug therapy: Secondary | ICD-10-CM

## 2016-11-04 DIAGNOSIS — Z87440 Personal history of urinary (tract) infections: Secondary | ICD-10-CM

## 2016-11-04 DIAGNOSIS — Z9221 Personal history of antineoplastic chemotherapy: Secondary | ICD-10-CM

## 2016-11-04 DIAGNOSIS — Z9223 Personal history of estrogen therapy: Secondary | ICD-10-CM

## 2016-11-04 DIAGNOSIS — J9811 Atelectasis: Secondary | ICD-10-CM | POA: Diagnosis not present

## 2016-11-04 DIAGNOSIS — R5381 Other malaise: Secondary | ICD-10-CM | POA: Diagnosis not present

## 2016-11-04 DIAGNOSIS — I700 Atherosclerosis of aorta: Secondary | ICD-10-CM

## 2016-11-04 DIAGNOSIS — R0602 Shortness of breath: Secondary | ICD-10-CM | POA: Diagnosis not present

## 2016-11-04 DIAGNOSIS — Z792 Long term (current) use of antibiotics: Secondary | ICD-10-CM

## 2016-11-04 DIAGNOSIS — R634 Abnormal weight loss: Secondary | ICD-10-CM | POA: Diagnosis not present

## 2016-11-04 DIAGNOSIS — C3432 Malignant neoplasm of lower lobe, left bronchus or lung: Secondary | ICD-10-CM

## 2016-11-04 DIAGNOSIS — Z853 Personal history of malignant neoplasm of breast: Secondary | ICD-10-CM

## 2016-11-04 DIAGNOSIS — Z9181 History of falling: Secondary | ICD-10-CM | POA: Diagnosis not present

## 2016-11-04 DIAGNOSIS — I110 Hypertensive heart disease with heart failure: Secondary | ICD-10-CM

## 2016-11-04 DIAGNOSIS — R5383 Other fatigue: Secondary | ICD-10-CM

## 2016-11-04 DIAGNOSIS — Z881 Allergy status to other antibiotic agents status: Secondary | ICD-10-CM

## 2016-11-04 DIAGNOSIS — Z85118 Personal history of other malignant neoplasm of bronchus and lung: Secondary | ICD-10-CM

## 2016-11-04 DIAGNOSIS — J181 Lobar pneumonia, unspecified organism: Secondary | ICD-10-CM

## 2016-11-04 DIAGNOSIS — M797 Fibromyalgia: Secondary | ICD-10-CM

## 2016-11-04 DIAGNOSIS — M069 Rheumatoid arthritis, unspecified: Secondary | ICD-10-CM

## 2016-11-04 DIAGNOSIS — J918 Pleural effusion in other conditions classified elsewhere: Secondary | ICD-10-CM

## 2016-11-04 DIAGNOSIS — Z170 Estrogen receptor positive status [ER+]: Secondary | ICD-10-CM

## 2016-11-04 DIAGNOSIS — R531 Weakness: Secondary | ICD-10-CM | POA: Diagnosis not present

## 2016-11-04 DIAGNOSIS — I509 Heart failure, unspecified: Secondary | ICD-10-CM

## 2016-11-04 DIAGNOSIS — J189 Pneumonia, unspecified organism: Secondary | ICD-10-CM | POA: Diagnosis not present

## 2016-11-04 DIAGNOSIS — F17210 Nicotine dependence, cigarettes, uncomplicated: Secondary | ICD-10-CM

## 2016-11-04 DIAGNOSIS — Z9981 Dependence on supplemental oxygen: Secondary | ICD-10-CM

## 2016-11-04 LAB — CBC WITH DIFFERENTIAL/PLATELET
Basophils Absolute: 0 10*3/uL (ref 0–0.1)
Basophils Relative: 1 %
Eosinophils Absolute: 0.3 10*3/uL (ref 0–0.7)
Eosinophils Relative: 5 %
HCT: 34.3 % — ABNORMAL LOW (ref 35.0–47.0)
Hemoglobin: 11.4 g/dL — ABNORMAL LOW (ref 12.0–16.0)
Lymphocytes Relative: 18 %
Lymphs Abs: 0.9 10*3/uL — ABNORMAL LOW (ref 1.0–3.6)
MCH: 31.1 pg (ref 26.0–34.0)
MCHC: 33.2 g/dL (ref 32.0–36.0)
MCV: 93.8 fL (ref 80.0–100.0)
Monocytes Absolute: 0.5 10*3/uL (ref 0.2–0.9)
Monocytes Relative: 10 %
Neutro Abs: 3.3 10*3/uL (ref 1.4–6.5)
Neutrophils Relative %: 66 %
Platelets: 214 10*3/uL (ref 150–440)
RBC: 3.66 MIL/uL — ABNORMAL LOW (ref 3.80–5.20)
RDW: 15.8 % — ABNORMAL HIGH (ref 11.5–14.5)
WBC: 5 10*3/uL (ref 3.6–11.0)

## 2016-11-04 LAB — COMPREHENSIVE METABOLIC PANEL
ALT: 10 U/L — ABNORMAL LOW (ref 14–54)
AST: 19 U/L (ref 15–41)
Albumin: 3.5 g/dL (ref 3.5–5.0)
Alkaline Phosphatase: 128 U/L — ABNORMAL HIGH (ref 38–126)
Anion gap: 12 (ref 5–15)
BUN: 13 mg/dL (ref 6–20)
CO2: 26 mmol/L (ref 22–32)
Calcium: 9 mg/dL (ref 8.9–10.3)
Chloride: 95 mmol/L — ABNORMAL LOW (ref 101–111)
Creatinine, Ser: 1.2 mg/dL — ABNORMAL HIGH (ref 0.44–1.00)
GFR calc Af Amer: 51 mL/min — ABNORMAL LOW (ref >60)
GFR calc non Af Amer: 44 mL/min — ABNORMAL LOW (ref >60)
Glucose, Bld: 133 mg/dL — ABNORMAL HIGH (ref 65–99)
Potassium: 3.9 mmol/L (ref 3.5–5.1)
Sodium: 133 mmol/L — ABNORMAL LOW (ref 135–145)
Total Bilirubin: 0.4 mg/dL (ref 0.3–1.2)
Total Protein: 7.2 g/dL (ref 6.5–8.1)

## 2016-11-04 LAB — MAGNESIUM: Magnesium: 1.9 mg/dL (ref 1.7–2.4)

## 2016-11-04 MED ORDER — AZITHROMYCIN 250 MG PO TABS: ORAL_TABLET | ORAL | 0 refills | Status: DC
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
## 2016-11-04 NOTE — Progress Notes (Signed)
Pt here for follow up. Stated dx w pneumonia 2 wks ago on last 2 days of Levaquin. Stated still has dark green mucous when she coughs. Also stated she fell off step stool yesterday hitting  R elbow,head,shoulder and hip. -all right side "sore all over " per pt  Today stated pain level at 10 ( see pain assessment )  Alert and interactive without grimacing today.

## 2016-11-06 DIAGNOSIS — D649 Anemia, unspecified: Secondary | ICD-10-CM | POA: Diagnosis not present

## 2016-11-06 DIAGNOSIS — R5383 Other fatigue: Secondary | ICD-10-CM | POA: Diagnosis not present

## 2016-11-06 DIAGNOSIS — J189 Pneumonia, unspecified organism: Secondary | ICD-10-CM | POA: Diagnosis not present

## 2016-11-06 DIAGNOSIS — J440 Chronic obstructive pulmonary disease with acute lower respiratory infection: Secondary | ICD-10-CM | POA: Diagnosis not present

## 2016-11-06 DIAGNOSIS — R531 Weakness: Secondary | ICD-10-CM | POA: Diagnosis not present

## 2016-11-06 DIAGNOSIS — Z792 Long term (current) use of antibiotics: Secondary | ICD-10-CM | POA: Diagnosis not present

## 2016-11-06 DIAGNOSIS — C3432 Malignant neoplasm of lower lobe, left bronchus or lung: Secondary | ICD-10-CM | POA: Diagnosis not present

## 2016-11-06 DIAGNOSIS — R0602 Shortness of breath: Secondary | ICD-10-CM | POA: Diagnosis not present

## 2016-11-06 DIAGNOSIS — J9811 Atelectasis: Secondary | ICD-10-CM | POA: Diagnosis not present

## 2016-11-10 LAB — EXPECTORATED SPUTUM ASSESSMENT W REFEX TO RESP CULTURE

## 2016-11-10 LAB — EXPECTORATED SPUTUM ASSESSMENT W GRAM STAIN, RFLX TO RESP C

## 2016-11-16 DIAGNOSIS — R0602 Shortness of breath: Secondary | ICD-10-CM | POA: Diagnosis not present

## 2016-11-16 DIAGNOSIS — I5022 Chronic systolic (congestive) heart failure: Secondary | ICD-10-CM | POA: Diagnosis not present

## 2016-11-17 ENCOUNTER — Ambulatory Visit: Payer: Medicare HMO

## 2016-11-21 ENCOUNTER — Ambulatory Visit
Admission: RE | Admit: 2016-11-21 | Discharge: 2016-11-21 | Disposition: A | Discharge status: Home / Self Care | Payer: Medicare HMO | Source: Ambulatory Visit | Attending: Urgent Care | Admitting: Urgent Care

## 2016-11-21 DIAGNOSIS — J9819 Other pulmonary collapse: Secondary | ICD-10-CM | POA: Insufficient documentation

## 2016-11-21 DIAGNOSIS — I712 Thoracic aortic aneurysm, without rupture: Secondary | ICD-10-CM | POA: Diagnosis not present

## 2016-11-21 DIAGNOSIS — C3432 Malignant neoplasm of lower lobe, left bronchus or lung: Secondary | ICD-10-CM | POA: Diagnosis not present

## 2016-11-21 DIAGNOSIS — J90 Pleural effusion, not elsewhere classified: Secondary | ICD-10-CM | POA: Insufficient documentation

## 2016-11-21 DIAGNOSIS — J432 Centrilobular emphysema: Secondary | ICD-10-CM | POA: Insufficient documentation

## 2016-11-21 DIAGNOSIS — J189 Pneumonia, unspecified organism: Secondary | ICD-10-CM | POA: Diagnosis not present

## 2016-12-01 ENCOUNTER — Encounter: Payer: Self-pay | Admitting: Internal Medicine

## 2016-12-01 ENCOUNTER — Ambulatory Visit (INDEPENDENT_AMBULATORY_CARE_PROVIDER_SITE_OTHER): Payer: Medicare HMO | Admitting: Internal Medicine

## 2016-12-01 ENCOUNTER — Encounter: Payer: Self-pay | Admitting: Hematology and Oncology

## 2016-12-01 ENCOUNTER — Inpatient Hospital Stay: Payer: Medicare HMO

## 2016-12-01 ENCOUNTER — Inpatient Hospital Stay: Payer: Medicare HMO | Attending: Hematology and Oncology | Admitting: Hematology and Oncology

## 2016-12-01 VITALS — BP 110/80 | HR 68 | Temp 97.6°F | Resp 18 | Wt 113.0 lb

## 2016-12-01 VITALS — BP 116/74 | HR 107 | Resp 16 | Ht 62.0 in | Wt 112.0 lb

## 2016-12-01 DIAGNOSIS — G8929 Other chronic pain: Secondary | ICD-10-CM

## 2016-12-01 DIAGNOSIS — M069 Rheumatoid arthritis, unspecified: Secondary | ICD-10-CM

## 2016-12-01 DIAGNOSIS — Z881 Allergy status to other antibiotic agents status: Secondary | ICD-10-CM | POA: Diagnosis not present

## 2016-12-01 DIAGNOSIS — R5381 Other malaise: Secondary | ICD-10-CM | POA: Insufficient documentation

## 2016-12-01 DIAGNOSIS — Z9221 Personal history of antineoplastic chemotherapy: Secondary | ICD-10-CM | POA: Insufficient documentation

## 2016-12-01 DIAGNOSIS — I509 Heart failure, unspecified: Secondary | ICD-10-CM | POA: Insufficient documentation

## 2016-12-01 DIAGNOSIS — J432 Centrilobular emphysema: Secondary | ICD-10-CM

## 2016-12-01 DIAGNOSIS — Z9223 Personal history of estrogen therapy: Secondary | ICD-10-CM | POA: Insufficient documentation

## 2016-12-01 DIAGNOSIS — D649 Anemia, unspecified: Secondary | ICD-10-CM | POA: Insufficient documentation

## 2016-12-01 DIAGNOSIS — C3432 Malignant neoplasm of lower lobe, left bronchus or lung: Secondary | ICD-10-CM | POA: Insufficient documentation

## 2016-12-01 DIAGNOSIS — J449 Chronic obstructive pulmonary disease, unspecified: Secondary | ICD-10-CM

## 2016-12-01 DIAGNOSIS — I700 Atherosclerosis of aorta: Secondary | ICD-10-CM | POA: Diagnosis not present

## 2016-12-01 DIAGNOSIS — J9811 Atelectasis: Secondary | ICD-10-CM

## 2016-12-01 DIAGNOSIS — J9611 Chronic respiratory failure with hypoxia: Secondary | ICD-10-CM

## 2016-12-01 DIAGNOSIS — I110 Hypertensive heart disease with heart failure: Secondary | ICD-10-CM

## 2016-12-01 DIAGNOSIS — M797 Fibromyalgia: Secondary | ICD-10-CM | POA: Diagnosis not present

## 2016-12-01 DIAGNOSIS — Z853 Personal history of malignant neoplasm of breast: Secondary | ICD-10-CM | POA: Insufficient documentation

## 2016-12-01 DIAGNOSIS — F17210 Nicotine dependence, cigarettes, uncomplicated: Secondary | ICD-10-CM

## 2016-12-01 DIAGNOSIS — Z170 Estrogen receptor positive status [ER+]: Secondary | ICD-10-CM | POA: Diagnosis not present

## 2016-12-01 DIAGNOSIS — Z9981 Dependence on supplemental oxygen: Secondary | ICD-10-CM | POA: Insufficient documentation

## 2016-12-01 DIAGNOSIS — R630 Anorexia: Secondary | ICD-10-CM | POA: Insufficient documentation

## 2016-12-01 DIAGNOSIS — Z79899 Other long term (current) drug therapy: Secondary | ICD-10-CM | POA: Insufficient documentation

## 2016-12-01 DIAGNOSIS — N390 Urinary tract infection, site not specified: Secondary | ICD-10-CM | POA: Diagnosis not present

## 2016-12-01 DIAGNOSIS — Z85118 Personal history of other malignant neoplasm of bronchus and lung: Secondary | ICD-10-CM

## 2016-12-01 DIAGNOSIS — Z923 Personal history of irradiation: Secondary | ICD-10-CM

## 2016-12-01 DIAGNOSIS — Z90710 Acquired absence of both cervix and uterus: Secondary | ICD-10-CM | POA: Insufficient documentation

## 2016-12-01 DIAGNOSIS — Z880 Allergy status to penicillin: Secondary | ICD-10-CM | POA: Diagnosis not present

## 2016-12-01 LAB — CBC WITH DIFFERENTIAL/PLATELET
Basophils Absolute: 0.1 10*3/uL (ref 0–0.1)
Basophils Relative: 1 %
Eosinophils Absolute: 0.2 10*3/uL (ref 0–0.7)
Eosinophils Relative: 3 %
HCT: 39 % (ref 35.0–47.0)
Hemoglobin: 13.3 g/dL (ref 12.0–16.0)
Lymphocytes Relative: 15 %
Lymphs Abs: 0.9 10*3/uL — ABNORMAL LOW (ref 1.0–3.6)
MCH: 31.5 pg (ref 26.0–34.0)
MCHC: 34 g/dL (ref 32.0–36.0)
MCV: 92.7 fL (ref 80.0–100.0)
Monocytes Absolute: 0.5 10*3/uL (ref 0.2–0.9)
Monocytes Relative: 9 %
Neutro Abs: 4.5 10*3/uL (ref 1.4–6.5)
Neutrophils Relative %: 72 %
Platelets: 225 10*3/uL (ref 150–440)
RBC: 4.21 MIL/uL (ref 3.80–5.20)
RDW: 14.4 % (ref 11.5–14.5)
WBC: 6.2 10*3/uL (ref 3.6–11.0)

## 2016-12-01 LAB — MAGNESIUM: Magnesium: 2.2 mg/dL (ref 1.7–2.4)

## 2016-12-01 LAB — COMPREHENSIVE METABOLIC PANEL
ALT: 11 U/L — ABNORMAL LOW (ref 14–54)
AST: 19 U/L (ref 15–41)
Albumin: 3.6 g/dL (ref 3.5–5.0)
Alkaline Phosphatase: 150 U/L — ABNORMAL HIGH (ref 38–126)
Anion gap: 7 (ref 5–15)
BUN: 16 mg/dL (ref 6–20)
CO2: 27 mmol/L (ref 22–32)
Calcium: 9 mg/dL (ref 8.9–10.3)
Chloride: 97 mmol/L — ABNORMAL LOW (ref 101–111)
Creatinine, Ser: 1.36 mg/dL — ABNORMAL HIGH (ref 0.44–1.00)
GFR calc Af Amer: 43 mL/min — ABNORMAL LOW (ref >60)
GFR calc non Af Amer: 37 mL/min — ABNORMAL LOW (ref >60)
Glucose, Bld: 119 mg/dL — ABNORMAL HIGH (ref 65–99)
Potassium: 4.5 mmol/L (ref 3.5–5.1)
Sodium: 131 mmol/L — ABNORMAL LOW (ref 135–145)
Total Bilirubin: 0.4 mg/dL (ref 0.3–1.2)
Total Protein: 7.8 g/dL (ref 6.5–8.1)

## 2016-12-01 NOTE — Progress Notes (Signed)
[facility] day:  12/01/2016   Chief Complaint: [name] [name] is a 76 y.o. 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  HPI: The patient was last seen in the medical oncology clinic on 11/04/2016.  At that time, she had continued cough and congestion.  She denied fever.  Exam was stable. WBC was 5000 with an [place] of 3300. Hemoglobin 11.4, hematocrit 34.3, and platelets 214,000.  She completed a course of Levaquin.  She then received azithromycin.  Chest CT was rescheduled.  Chest CT on 11/21/2016 revealed progressive collapse of the LEFT lower lobe presumably related to central obstructing mass.  Mass obstructed the LEFT lower lobe bronchus, although mass was not well demonstrated.  There were small effusions (no change).  There was centrilobular emphysema unchanged.  There was no evidence pneumonia.  Symptomatically, patient is doing "lousy". Patient notes that she is having joint pains. Patient currently having urinary tract symptoms. She was treated with Fosfomycin x 1 dose by Dr. Ginette Pitman.  Patient denies increased shortness of breath. She has not had any fevers. Patient has a poor appetite, with no demonstrated weight loss. Patient has chronic pain that she rates 5/10 in the clinic today.    Past Medical History:  Diagnosis Date  . Asthma   . Breast cancer ([place]) 2009   left  . Cancer ([place])   . CHF (congestive heart failure) ([place])   . CHF (congestive heart failure) ([place])   . Chronic UTI   . COPD (chronic obstructive pulmonary disease) ([place])   . Dizziness   . Fibromyalgia   . Hypertension   . Neuropathy   . Personal history of tobacco use, presenting hazards to health 05/17/2015  . Polyp, larynx   . RA (rheumatoid arthritis) ([place])   . Sinus infection    recent  . Stumbling gait    to the left  . Supplemental oxygen  dependent    2.5l    Past Surgical History:  Procedure Laterality Date  . ABDOMINAL HYSTERECTOMY    . BREAST LUMPECTOMY Left 2009   chemo and radiation  . CYST REMOVAL Left 02/27/2015   Performed by Hessie Knows, MD at [facility] ORS  . EYE MUSCLE SURGERY Right    13 surgeries  . FLEXIBLE BRONCHOSCOPY N/A 07/01/2016   Performed by Wilhelmina Mcardle, MD at [facility] ORS  . THUMB ARTHROSCOPY Left     Family History  Problem Relation Age of Onset  . Diabetes Father   . Stroke Father   . Heart attack Father   . CAD Sister     Social History:  reports that she has been smoking cigarettes.  She has a 40.00 pack-year smoking history. she has never used smokeless tobacco. She reports that she does not drink alcohol or use drugs.  He lives with her son and grandson. The patient is alone today.   Allergies:  Allergies  Allergen Reactions  . Contrast Media [Iodinated Diagnostic Agents] Other (See Comments)    Pt was sent to the ED following contrast media injection at [place]. Unknown reason. She has been premedicated since without complications. Pt to be premedicated prior to contrast media injections  . Ioxaglate Other (See Comments)    Pt was sent to the ED following contrast media injection at [place]. Unknown reason. She has been premedicated since without  complications. Pt to be premedicated prior to contrast media injections  . Sulfa Antibiotics     Other reaction(s): Other (See Comments)  . Tetracycline Hives and Other (See Comments)  . White Petrolatum Other (See Comments)  . Amoxicillin-Pot Clavulanate Rash    Other reaction(s): Unknown Other reaction(s): Unknown _0  Other reaction(s): Unknown Blisters in mouth Blisters in mouth  . Tape Rash    Current Medications: Current Outpatient Medications  Medication Sig Dispense Refill  . Calcium-Vitamin D 600-200 MG-UNIT  tablet Take 1 tablet by mouth daily.     . carvedilol (COREG) 12.5 MG tablet Take 1 tablet (12.5 mg total) by mouth 2 (two) times daily with a meal. 60 tablet 0  . cholecalciferol (VITAMIN D) 1000 units tablet Take 1,000 Units by mouth daily.    . clonazePAM (KLONOPIN) 1 MG tablet Take 1 mg by mouth 2 (two) times daily as needed for anxiety.     [name] dextromethorphan-guaiFENesin (TUSSIN DM) 10-100 MG/5ML liquid Take 5 mLs by mouth every 6 (six) hours as needed for cough. 237 mL 1  . gabapentin (NEURONTIN) 600 MG tablet Take 600 mg by mouth 3 (three) times daily.     [name] HYDROcodone-acetaminophen (NORCO/VICODIN) 5-325 MG tablet Take 1-2 tablets by mouth every 4 (four) hours as needed for moderate pain.     [name] imipramine (TOFRANIL) 25 MG tablet Take 1 tablet by mouth at bedtime.     [name] MONUROL 3 g PACK Take 3 g by mouth one time only at 6 PM.    . Multiple Vitamin (MULTIVITAMIN) capsule Take 1 capsule by mouth daily.    . nitrofurantoin, macrocrystal-monohydrate, (MACROBID) 100 MG capsule Take 100 mg by mouth 2 (two) times daily.    . nitroGLYCERIN (NITROSTAT) 0.4 MG SL tablet Place under the tongue.    . ondansetron (ZOFRAN) 8 MG tablet     . Tiotropium Bromide Monohydrate (SPIRIVA RESPIMAT) 2.5 MCG/ACT AERS Inhale 2.5 mcg into the lungs 2 (two) times daily. (Patient taking differently: Inhale 2.5 mcg into the lungs daily. ) 1 Inhaler 0  . traMADol (ULTRAM) 50 MG tablet Take 50 mg by mouth daily.     . vitamin C (ASCORBIC ACID) 500 MG tablet Take 500 mg by mouth daily.    [name] ZINC SULFATE PO Take 1 tablet by mouth daily.     [name] zolpidem (AMBIEN) 5 MG tablet Take 5 mg by mouth at bedtime.    . benzonatate (TESSALON) 200 MG capsule Take 1 capsule (200 mg total) by mouth 3 (three) times daily as needed for cough. (Patient not taking: Reported on 11/04/2016) 20 capsule 0  . potassium chloride (K-DUR) 10 MEQ tablet Take 1 tablet (10 mEq total) by mouth daily. 10 tablet 0  . sucralfate (CARAFATE) 1 g tablet Take  1 tablet (1 g total) by mouth 3 (three) times daily. (Patient not taking: Reported on 10/28/2016) 90 tablet 6   No current facility-administered medications for this visit.     Review of Systems:  GENERAL:  Feels "lousy".  No fevers or sweats.  Weight stable. PERFORMANCE STATUS (ECOG):  2 HEENT:  No visual changes, runny nose, sore throat, mouth sores or tenderness. Lungs: Shortness of breath, improved s/p antibiotics.  Cough.  No hemoptysis.  On oxygen 2 liter/min via [place]. Cardiac:  No chest pain, palpitations, orthopnea, or PND. GI:   No nausea, vomiting, diarrhea, constipation, melena or hematochezia. GU:  No urgency, dysuria, or hematuria.  Musculoskeletal:  Fibromyalgia.  Osteoporosis.  Joints ache.   No muscle tenderness. Extremities:  No pain or swelling. Skin:  No rashes or skin changes. Neuro:  No headache, numbness or weakness, balance or coordination issues. Endocrine:  No diabetes, thyroid issues, hot flashes or night sweats. Psych:  No mood changes, depression or anxiety. Pain:  5/10; "whole body hurts"  Review of systems:  All other systems reviewed and found to be negative.  Physical Exam: Blood pressure 110/80, pulse 68, temperature 97.6 F (36.4 C), temperature source Tympanic, resp. rate 18, weight 113 lb (51.3 kg). O2 sats 95%. GENERAL:  Thin woman sitting comfortably in the exam room in no acute distress. MENTAL STATUS:  Alert and oriented to person, place and time. HEAD:  Short thin blonde hair.  Normocephalic, atraumatic, face symmetric, no Cushingoid features. EYES:  Blue eyes.  Pupils equal round and reactive to light and accomodation.  No conjunctivitis or scleral icterus. ENT:  Oropharynx clear without lesion.  Tongue normal. Mucous membranes moist.  RESPIRATORY:  Decreased respiratory excursion.  Decreased breath sounds LLL.  No rales, wheezes or rhonchi. CARDIOVASCULAR:  Regular rate and rhythm without murmur, rub or gallop. ABDOMEN:  Soft, non tender with  active bowel sounds and no hepatosplenomegaly.  No masses. SKIN:  No rashes, ulcers or lesions. EXTREMITIES: Arthritic changes in hands. Ankle edema.  No skin discoloration or tenderness.  No palpable cords. LYMPH NODES: No palpable cervical, supraclavicular, axillary or inguinal adenopathy  NEUROLOGICAL: Unremarkable. PSYCH:  Appropriate.   Appointment on 12/01/2016  Component Date Value Ref Range Status  . Magnesium 12/01/2016 2.2  1.7 - 2.4 mg/dL Final  . Sodium 12/01/2016 131* 135 - 145 mmol/L Final  . Potassium 12/01/2016 4.5  3.5 - 5.1 mmol/L Final  . Chloride 12/01/2016 97* 101 - 111 mmol/L Final  . CO2 12/01/2016 27  22 - 32 mmol/L Final  . Glucose, Bld 12/01/2016 119* 65 - 99 mg/dL Final  . BUN 12/01/2016 16  6 - 20 mg/dL Final  . Creatinine, Ser 12/01/2016 1.36* 0.44 - 1.00 mg/dL Final  . Calcium 12/01/2016 9.0  8.9 - 10.3 mg/dL Final  . Total Protein 12/01/2016 7.8  6.5 - 8.1 g/dL Final  . Albumin 12/01/2016 3.6  3.5 - 5.0 g/dL Final  . AST 12/01/2016 19  15 - 41 U/L Final  . ALT 12/01/2016 11* 14 - 54 U/L Final  . Alkaline Phosphatase 12/01/2016 150* 38 - 126 U/L Final  . Total Bilirubin 12/01/2016 0.4  0.3 - 1.2 mg/dL Final  . GFR calc non Af Amer 12/01/2016 37* >60 mL/min Final  . GFR calc Af Amer 12/01/2016 43* >60 mL/min Final   Comment: (NOTE) The eGFR has been calculated using the CKD EPI equation. This calculation has not been validated in all clinical situations. eGFR's persistently <60 mL/min signify possible Chronic Kidney Disease.   . Anion gap 12/01/2016 7  5 - 15 Final  . WBC 12/01/2016 6.2  3.6 - 11.0 K/uL Final  . RBC 12/01/2016 4.21  3.80 - 5.20 MIL/uL Final  . Hemoglobin 12/01/2016 13.3  12.0 - 16.0 g/dL Final  . HCT 12/01/2016 39.0  35.0 - 47.0 % Final  . MCV 12/01/2016 92.7  80.0 - 100.0 fL Final  . MCH 12/01/2016 31.5  26.0 - 34.0 pg Final  . MCHC 12/01/2016 34.0  32.0 - 36.0 g/dL Final  . RDW 12/01/2016 14.4  11.5 - 14.5 % Final  . Platelets  12/01/2016 225  150 - 440 K/uL Final  . Neutrophils Relative % 12/01/2016 72  % Final  . Neutro Abs 12/01/2016 4.5  1.4 - 6.5 K/uL Final  . Lymphocytes Relative 12/01/2016 15  % Final  . Lymphs Abs 12/01/2016 0.9* 1.0 - 3.6 K/uL Final  . Monocytes Relative 12/01/2016 9  % Final  . Monocytes Absolute 12/01/2016 0.5  0.2 - 0.9 K/uL Final  . Eosinophils Relative 12/01/2016 3  % Final  . Eosinophils Absolute 12/01/2016 0.2  0 - 0.7 K/uL Final  . Basophils Relative 12/01/2016 1  % Final  . Basophils Absolute 12/01/2016 0.1  0 - 0.1 K/uL Final    Assessment:  CELENIA HRUSKA is a 76 y.o. female with a clinical T2bNxM0 sqamous cell lung cancer of the left lung s/p bronchoscopy and biopsy on 07/01/2016.  She has a 40 pack year smoking history.  She presented with left lower chest wall pain.  Chest CT with contrast on 06/11/2016 revealed a 3.2 x 3.0 mass like area of focal opacity in the medial left lower lobe with obliteration of segmental airways to the anterior left lower lobe.  PET scan on 06/27/2016 revealed a 4.3 x 3.2 cm central left lower lobe lung lesion (SUV 14.3) consistent with primary bronchogenic carcinoma.  There was equivocal nodal tissue in the subcarinal station demonstrating mild hypermetabolism (SUV 3.6). There was more peripheral left lower lobe increased atelectasis with mucoid impaction which is likely secondary to endobronchial obstruction.  There was a small left pleural effusion.  Head MRI on 07/07/2016 revealed no evidence of metastatic disease.  She received radiation from 07/22/2016 - 09/19/2016.  She received 5 weeks of concurrent carboplatin and Taxol (07/24/2016 - 08/11/2016; 09/05/2016 - 09/16/2016). She requires a reduced dose of Benadryl (25 mg) for her premedication.  Week #3 was postponed secondary to chest pain and evaluation.  She missed a couple of weeks.  Chest CT on 11/21/2016 revealed progressive collapse of the LEFT lower lobe presumably related to central  obstructing mass.  Mass obstructed the LEFT lower lobe bronchus, although mass was not well demonstrated.  There were small effusions (no change).  There was centrilobular emphysema unchanged.  There was no evidence pneumonia.  She has a normocytic anemia likely due to chemotherapy.  Work-up on 07/24/2016 revealed the following normal labs: ferritin (103), B12 (345), folate(19.7).  Iron saturation was 8% and TIBC was 258.  She has a history of stage IA left breast cancer in 02/2005.  She underwent lumpectomy.  Pathology revealed a T1cN0 lesion.  Tumor was ER + , PR +, and Her2/neu -.  Two sentinel lymph nodes were negative.  She received chemotherapy (4 cycles of AC and possibly an abbreviated course of Taxol- no records available).  She received radiation.  She completed Femara in 08/2010.  She has fibromyalgia.  She has advanced thoracoabdominal aortic atherosclerosis with fusiform dilatation of the descending thoracic aorta up to 4.3 cm.  She is followed by Dr Lucky Cowboy.  CXR on 10/28/2016 revealed left lower lobe atelectasis or pneumonia with small left pleural effusion.  There was a tiny right pleural effusion and underlying COPD.  She is completed a course of Levaquin (last day on 11/05/2016) and then received azithromycin.  Symptomatically, she is aching in all of her joints.  She has continued cough and congestion. She currently has UTI symptoms.  She denies fever.  Exam reveals decreased breath sounds in the left lower lobe.  WBC is 6200 with an [place]  of 4500. Hemoglobin 13.3, hematocrit 39.0, and platelets 225,000.   Plan: 1.  Review interval chest CT.  Discuss collapse of left lower lobe.   2.  Discuss with Dr Leonidas Romberg. Patient to be seen by Dr. Montel Culver this afternoon.  Discuss plan for bronchoscopy. 3.  RTC after appointment with pulmonology for MD assessment - patient to call for an appointment     Honor Loh, NP  12/01/2016, 11:45 AM   I saw and evaluated the patient, participating  in the key portions of the service and reviewing pertinent diagnostic studies and records.  I reviewed the nurse practitioner's note and agree with the findings and the plan.  The assessment and plan were discussed with the patient.  Multiple questions were asked by the patient and answered.   Lequita Asal, MD  12/01/2016, 11:45 AM

## 2016-12-01 NOTE — Patient Instructions (Signed)
--  Use your combivent 2 puffs 2 to 3 times daily.  --Call us back if your breathing worsens before that of if you reconsider having the stent placement.

## 2016-12-01 NOTE — Progress Notes (Signed)
Pt in for follow up and labs today.  Reports having pain "all over".  Takes tramadol, helps relieve pain.  No other concerns voiced today.

## 2016-12-01 NOTE — Progress Notes (Signed)
* [name] [name]   Synopsis: Patient was diagnosed with squamous cell lung cancer on 07/01/16 by bronchoscopy.  She [id number] sqamous cell lung cancer  status post chemotherapy radiation.   Assessment and Plan:  Left lower lobe atelectasis secondary to lung cancer. - I had a discussion with the patient concerning her left lower lobe atelectasis which is very likely secondary to expansion of her underlying lung cancer.  As she has already received chemotherapy and radiation and had continued growth of the underlying cancer at this time I would recommend referral to interventional pulmonologist for potential bronchial stenting.  We discussed that this would involve referral to [facility] or a tertiary care center.  She tells me that she does not want to do something that would involve her going outside of [place], nor does she want to do something that would not hinder the growth of the cancer.  I explained that with continued atelectasis she is at high risk of recurrent pneumonias, and her breathing could worsen.  Placement of the stent could potentially improve her breathing, as well as reduce the risk of recurrent pneumonias.  She expressed understanding of this, but reiterates that she does not want to travel nor does she want to do something that will not treat the cancer. She would prefer to try another form of chemotherapy.  -Therefore she will follow-up with [place] in approximately 3 months, she can call us back sooner if she changes her mind about bronchial stenting or if her breathing worsens.  COPD, with chronic respiratory failure, dyspnea on exertion. - Patient reports dyspnea on exertion, with mild activity. - Patient is asked to use her Combivent more often, 2 puffs can be used 3 times a day, she is only using it once per day currently. -We will check an overnight oximetry on patient's current level of 2.5 L, if her oximetry shows continued hypoxemia at this level will  increase nocturnal oxygen supplementation.  Nicotine Abuse.  --this is also likely contributing to her dyspnea.  --Discussed the importance of smoking cessation, spent 3 min in discussion.   Date: 12/01/2016  MRN# [id number] [name] [name] [birth year]   [name] [name] is a 76 y.o. old female seen in follow up for chief complaint of  Chief Complaint  Patient presents with  . Follow-up    pt has sob/cough. She is on 02 24 2.5 L hrs daily  . Lung Mass    Subjective:  The patient was last seen for her bronchoscopy in June 2018.  She recently followed up with her oncologist and had a follow-up CT chest which showed left lower lobe atelectasis, hence was referred here. noted by her oncologist that she has been having symptoms of continued cough and congestion, and not feeling well. Today she notes that she just got over a pneumonia which is "just about gone" and is now feeling better. She tells me that she was having cough and congestion about a month ago, and got 2 courses of abx.  She does not feel sick today and says that she was not feeling sick when she had the CT chest 10 days ago. She does notice  dyspnea, particularly at night and with exertion.  She sleeps on 3 pillows, she is supposed to be on oxygen but is not wearing it currently, she is wearing 2.5L at night but feels that her breathing is short at night. She also had bouts of bouts of shortness of breath during the day. She gets  worn out and has to sit down. Activities like folding clothes, showering cleaning make her very winded.  She does not use her rescue/Combivent inhaler at that time.  She uses her Combivent inhaler only once per day in the late afternoon.  Desat walk 12/01/16; Baseline sat on RA at rest; sat 93% and HR 99;  After walking 360 she felt moderate dyspnea, sat was 91% and HR 106.   CT chest 11/21/16, and comparison with previous on 06/11/16, there is complete left lower lobe atelectasis secondary to obstruction of the  left lower lobe bronchus.  On previous CT chest and on previous bronchoscopy this was partially obstructed, this now appears to be completely obstructed.    Medication:    Current Outpatient Medications:  .  benzonatate (TESSALON) 200 MG capsule, Take 1 capsule (200 mg total) by mouth 3 (three) times daily as needed for cough. (Patient not taking: Reported on 11/04/2016), Disp: 20 capsule, Rfl: 0 .  Calcium-Vitamin D 600-200 MG-UNIT tablet, Take 1 tablet by mouth daily. , Disp: , Rfl:  .  carvedilol (COREG) 12.5 MG tablet, Take 1 tablet (12.5 mg total) by mouth 2 (two) times daily with a meal., Disp: 60 tablet, Rfl: 0 .  cholecalciferol (VITAMIN D) 1000 units tablet, Take 1,000 Units by mouth daily., Disp: , Rfl:  .  clonazePAM (KLONOPIN) 1 MG tablet, Take 1 mg by mouth 2 (two) times daily as needed for anxiety. , Disp: , Rfl:  .  dextromethorphan-guaiFENesin (TUSSIN DM) 10-100 MG/5ML liquid, Take 5 mLs by mouth every 6 (six) hours as needed for cough., Disp: 237 mL, Rfl: 1 .  gabapentin (NEURONTIN) 600 MG tablet, Take 600 mg by mouth 3 (three) times daily. , Disp: , Rfl:  .  HYDROcodone-acetaminophen (NORCO/VICODIN) 5-325 MG tablet, Take 1-2 tablets by mouth every 4 (four) hours as needed for moderate pain. , Disp: , Rfl:  .  imipramine (TOFRANIL) 25 MG tablet, Take 1 tablet by mouth at bedtime. , Disp: , Rfl:  .  MONUROL 3 g PACK, Take 3 g by mouth one time only at 6 PM., Disp: , Rfl:  .  Multiple Vitamin (MULTIVITAMIN) capsule, Take 1 capsule by mouth daily., Disp: , Rfl:  .  nitrofurantoin, macrocrystal-monohydrate, (MACROBID) 100 MG capsule, Take 100 mg by mouth 2 (two) times daily., Disp: , Rfl:  .  nitroGLYCERIN (NITROSTAT) 0.4 MG SL tablet, Place under the tongue., Disp: , Rfl:  .  ondansetron (ZOFRAN) 8 MG tablet, , Disp: , Rfl:  .  potassium chloride (K-DUR) 10 MEQ tablet, Take 1 tablet (10 mEq total) by mouth daily., Disp: 10 tablet, Rfl: 0 .  sucralfate (CARAFATE) 1 g tablet, Take 1  tablet (1 g total) by mouth 3 (three) times daily. (Patient not taking: Reported on 10/28/2016), Disp: 90 tablet, Rfl: 6 .  Tiotropium Bromide Monohydrate (SPIRIVA RESPIMAT) 2.5 MCG/ACT AERS, Inhale 2.5 mcg into the lungs 2 (two) times daily. (Patient taking differently: Inhale 2.5 mcg into the lungs daily. ), Disp: 1 Inhaler, Rfl: 0 .  traMADol (ULTRAM) 50 MG tablet, Take 50 mg by mouth daily. , Disp: , Rfl:  .  vitamin C (ASCORBIC ACID) 500 MG tablet, Take 500 mg by mouth daily., Disp: , Rfl:  .  ZINC SULFATE PO, Take 1 tablet by mouth daily. , Disp: , Rfl:  .  zolpidem (AMBIEN) 5 MG tablet, Take 5 mg by mouth at bedtime., Disp: , Rfl:   Social History   Socioeconomic History  . Marital  status: Divorced    Spouse name: Not on file  . Number of children: Not on file  . Years of education: Not on file  . Highest education level: Not on file  Social Needs  . Financial resource strain: Not on file  . Food insecurity - worry: Not on file  . Food insecurity - inability: Not on file  . Transportation needs - medical: Not on file  . Transportation needs - non-medical: Not on file  Occupational History  . Not on file  Tobacco Use  . Smoking status: Current Every Day Smoker    Packs/day: 1.00    Years: 40.00    Pack years: 40.00    Types: Cigarettes  . Smokeless tobacco: Never Used  Substance and Sexual Activity  . Alcohol use: No  . Drug use: No  . Sexual activity: Not on file  Other Topics Concern  . Not on file  Social History Narrative  . Not on file    Allergies:  Contrast media [iodinated diagnostic agents]; Ioxaglate; Sulfa antibiotics; Tetracycline; White petrolatum; Amoxicillin-pot clavulanate; and Tape  Review of Systems: Gen:  Denies  fever, sweats. HEENT: Denies blurred vision. Cvc:  No dizziness, chest pain or heaviness Resp:   Denies cough or sputum porduction. Gi: Denies swallowing difficulty, stomach pain. constipation, bowel incontinence Gu:  Denies bladder  incontinence, burning urine Ext:   No Joint pain, stiffness. Skin: No skin rash, easy bruising. Endoc:  No polyuria, polydipsia. Psych: No depression, insomnia. Other:  All other systems were reviewed and found to be negative other than what is mentioned in the HPI.   Physical Examination:   VS: BP 116/74 (BP Location: Left Arm, Cuff Size: Normal)   Pulse (!) 107   Resp 16   Ht 5\' 2"  (1.575 m)   Wt 112 lb (50.8 kg)   SpO2 95%   BMI 20.49 kg/m   General Appearance: No distress  Neuro:without focal findings,  speech normal,  HEENT: PERRLA, EOM intact. Pulmonary: Decreased air entry left base. CardiovascularNormal S1,S2.  No m/r/g.   Abdomen: Benign, Soft, non-tender. Renal:  No costovertebral tenderness  GU:  Not performed at this time. Endoc: No evident thyromegaly, no signs of acromegaly. Skin:   warm, no rash. Extremities: normal, no cyanosis, clubbing.   LABORATORY PANEL:   CBC Recent Labs  Lab 12/01/16 1033  WBC 6.2  HGB 13.3  HCT 39.0  PLT 225   ------------------------------------------------------------------------------------------------------------------  Chemistries  Recent Labs  Lab 12/01/16 1033  NA 131*  K 4.5  CL 97*  CO2 27  GLUCOSE 119*  BUN 16  CREATININE 1.36*  CALCIUM 9.0  MG 2.2  AST 19  ALT 11*  ALKPHOS 150*  BILITOT 0.4   ------------------------------------------------------------------------------------------------------------------  Cardiac Enzymes No results for input(s): TROPONINI in the last 168 hours. ------------------------------------------------------------  RADIOLOGY:   No results found for this or any previous visit. Results for orders placed during the hospital encounter of 10/28/16  DG Chest 2 View   Narrative CLINICAL DATA:  Off and chest congestion for the past 2 weeks with fever. History of COPD -asthma, current smoker, CHF.  EXAM: CHEST  2 VIEW  COMPARISON:  Chest x-ray of August 07, 2016  FINDINGS: The right lung is mildly hyperinflated. There is a tiny right pleural effusion. On the left there is a small pleural effusion. There is left basilar atelectasis or infiltrate. The heart and pulmonary vascularity are normal. There is calcification in the wall of the aortic arch.  There is mild multilevel degenerative disc disease of the thoracic spine.  IMPRESSION: Left lower lobe atelectasis or pneumonia with small left pleural effusion. Tiny right pleural effusion. Underlying COPD. No overt CHF. Followup PA and lateral chest X-ray is recommended in 3-4 weeks following trial of antibiotic therapy to ensure resolution and exclude underlying malignancy.  Thoracic aortic atherosclerosis.   Electronically Signed   By: David  Martinique M.D.   On: 10/28/2016 12:18    ------------------------------------------------------------------------------------------------------------------  Thank  you for allowing [facility] to assist in the care of your patient. Our recommendations are noted above.  Please contact us if we can be of further service.   [name], MD.  [facility] Office Number: [phone]  Patricia Pesa, M.D.  Merton Border, M.D  12/01/2016

## 2016-12-11 ENCOUNTER — Encounter: Payer: Self-pay | Admitting: Intensive Care

## 2016-12-11 ENCOUNTER — Other Ambulatory Visit: Payer: Self-pay

## 2016-12-11 ENCOUNTER — Emergency Department
Admission: EM | Admit: 2016-12-11 | Discharge: 2016-12-11 | Disposition: A | Discharge status: Home / Self Care | Payer: Medicare HMO | Attending: Emergency Medicine | Admitting: Emergency Medicine

## 2016-12-11 ENCOUNTER — Emergency Department: Payer: Medicare HMO

## 2016-12-11 DIAGNOSIS — S46922A Laceration of unspecified muscle, fascia and tendon at shoulder and upper arm level, left arm, initial encounter: Secondary | ICD-10-CM | POA: Insufficient documentation

## 2016-12-11 DIAGNOSIS — I712 Thoracic aortic aneurysm, without rupture: Secondary | ICD-10-CM | POA: Insufficient documentation

## 2016-12-11 DIAGNOSIS — I2510 Atherosclerotic heart disease of native coronary artery without angina pectoris: Secondary | ICD-10-CM | POA: Diagnosis not present

## 2016-12-11 DIAGNOSIS — Y92009 Unspecified place in unspecified non-institutional (private) residence as the place of occurrence of the external cause: Secondary | ICD-10-CM | POA: Diagnosis not present

## 2016-12-11 DIAGNOSIS — S41112A Laceration without foreign body of left upper arm, initial encounter: Secondary | ICD-10-CM

## 2016-12-11 DIAGNOSIS — Y999 Unspecified external cause status: Secondary | ICD-10-CM | POA: Insufficient documentation

## 2016-12-11 DIAGNOSIS — W19XXXA Unspecified fall, initial encounter: Secondary | ICD-10-CM | POA: Diagnosis not present

## 2016-12-11 DIAGNOSIS — I110 Hypertensive heart disease with heart failure: Secondary | ICD-10-CM | POA: Diagnosis not present

## 2016-12-11 DIAGNOSIS — Z79899 Other long term (current) drug therapy: Secondary | ICD-10-CM | POA: Diagnosis not present

## 2016-12-11 DIAGNOSIS — S0990XA Unspecified injury of head, initial encounter: Secondary | ICD-10-CM | POA: Diagnosis not present

## 2016-12-11 DIAGNOSIS — Z23 Encounter for immunization: Secondary | ICD-10-CM | POA: Insufficient documentation

## 2016-12-11 DIAGNOSIS — R55 Syncope and collapse: Secondary | ICD-10-CM | POA: Diagnosis not present

## 2016-12-11 DIAGNOSIS — F17210 Nicotine dependence, cigarettes, uncomplicated: Secondary | ICD-10-CM | POA: Diagnosis not present

## 2016-12-11 DIAGNOSIS — S299XXA Unspecified injury of thorax, initial encounter: Secondary | ICD-10-CM | POA: Diagnosis not present

## 2016-12-11 DIAGNOSIS — Z9981 Dependence on supplemental oxygen: Secondary | ICD-10-CM | POA: Diagnosis not present

## 2016-12-11 DIAGNOSIS — Y9301 Activity, walking, marching and hiking: Secondary | ICD-10-CM | POA: Diagnosis not present

## 2016-12-11 DIAGNOSIS — S41012A Laceration without foreign body of left shoulder, initial encounter: Secondary | ICD-10-CM | POA: Diagnosis not present

## 2016-12-11 DIAGNOSIS — I5022 Chronic systolic (congestive) heart failure: Secondary | ICD-10-CM | POA: Diagnosis not present

## 2016-12-11 DIAGNOSIS — J45909 Unspecified asthma, uncomplicated: Secondary | ICD-10-CM | POA: Insufficient documentation

## 2016-12-11 DIAGNOSIS — S199XXA Unspecified injury of neck, initial encounter: Secondary | ICD-10-CM | POA: Diagnosis not present

## 2016-12-11 LAB — BASIC METABOLIC PANEL
Anion gap: 12 (ref 5–15)
BUN: 14 mg/dL (ref 6–20)
CALCIUM: 8.8 mg/dL — AB (ref 8.9–10.3)
CO2: 28 mmol/L (ref 22–32)
Chloride: 94 mmol/L — ABNORMAL LOW (ref 101–111)
Creatinine, Ser: 1.13 mg/dL — ABNORMAL HIGH (ref 0.44–1.00)
GFR calc Af Amer: 54 mL/min — ABNORMAL LOW (ref >60)
GFR, EST NON AFRICAN AMERICAN: 47 mL/min — AB (ref >60)
GLUCOSE: 108 mg/dL — AB (ref 65–99)
Potassium: 4 mmol/L (ref 3.5–5.1)
SODIUM: 134 mmol/L — AB (ref 135–145)

## 2016-12-11 LAB — URINALYSIS, COMPLETE (UACMP) WITH MICROSCOPIC
BACTERIA UA: NONE SEEN
BILIRUBIN URINE: NEGATIVE
Glucose, UA: NEGATIVE mg/dL
Ketones, ur: NEGATIVE mg/dL
NITRITE: NEGATIVE
PH: 6 (ref 5.0–8.0)
Protein, ur: NEGATIVE mg/dL
SPECIFIC GRAVITY, URINE: 1.003 — AB (ref 1.005–1.030)

## 2016-12-11 LAB — CBC
HCT: 36 % (ref 35.0–47.0)
Hemoglobin: 12 g/dL (ref 12.0–16.0)
MCH: 30.6 pg (ref 26.0–34.0)
MCHC: 33.4 g/dL (ref 32.0–36.0)
MCV: 91.8 fL (ref 80.0–100.0)
PLATELETS: 212 10*3/uL (ref 150–440)
RBC: 3.92 MIL/uL (ref 3.80–5.20)
RDW: 14.3 % (ref 11.5–14.5)
WBC: 4 10*3/uL (ref 3.6–11.0)

## 2016-12-11 LAB — HEPATIC FUNCTION PANEL
ALBUMIN: 3.2 g/dL — AB (ref 3.5–5.0)
ALK PHOS: 141 U/L — AB (ref 38–126)
ALT: 10 U/L — AB (ref 14–54)
AST: 19 U/L (ref 15–41)
BILIRUBIN TOTAL: 0.1 mg/dL — AB (ref 0.3–1.2)
Bilirubin, Direct: <0.1 mg/dL — ABNORMAL LOW (ref 0.1–0.5)
TOTAL PROTEIN: 6.8 g/dL (ref 6.5–8.1)

## 2016-12-11 LAB — TROPONIN I

## 2016-12-11 MED ORDER — LIDOCAINE HCL (PF) 1 % IJ SOLN
INTRAMUSCULAR | Status: AC
  Filled 2016-12-11: qty 5

## 2016-12-11 MED ORDER — LIDOCAINE HCL (PF) 1 % IJ SOLN
INTRAMUSCULAR | Status: AC
  Administered 2016-12-11: 5 mL
  Filled 2016-12-11: qty 5

## 2016-12-11 MED ORDER — LIDOCAINE HCL (PF) 1 % IJ SOLN
5.0000 mL | Freq: Once | INTRAMUSCULAR | Status: AC
Start: 2016-12-11 — End: 2016-12-11
  Administered 2016-12-11: 5 mL

## 2016-12-11 MED ORDER — TETANUS-DIPHTH-ACELL PERTUSSIS 5-2.5-18.5 LF-MCG/0.5 IM SUSP
0.5000 mL | Freq: Once | INTRAMUSCULAR | Status: AC
  Administered 2016-12-11: 0.5 mL via INTRAMUSCULAR
  Filled 2016-12-11: qty 0.5

## 2016-12-11 NOTE — ED Triage Notes (Signed)
Patient reports she was walking from kitchen to dining room and fell. She does not remember fall or what caused her to fall. HX COPD. Patient wears 2.5L O2 continuously. Bruise noted on L forehead and LArge laceration in L armpit with tissue showing. Cite is not draining blood at this time. Also c/o bilateral knee pain. A&O x4 in triage.

## 2016-12-11 NOTE — Discharge Instructions (Addendum)
I wish you would stay in the hospital but since you won't please follow-up with your regular doctor in next few days. Have the stitches checked in about 2 days and out in about 10. please return for any further problems feeling like you to pass out chest pain etc.the urine specimen was clear.

## 2016-12-11 NOTE — ED Notes (Signed)
Patient transported to CT 

## 2016-12-11 NOTE — ED Notes (Signed)
Admitting MD at bedside.

## 2016-12-11 NOTE — ED Provider Notes (Addendum)
[facility] Emergency Department Provider Note   ____________________________________________   First MD Initiated Contact with Patient 12/11/16 1608     (approximate)  I have reviewed the triage vital signs and the nursing notes.   HISTORY  Chief Complaint Fall and Loss of Consciousness   HPI [name] [name] is a 76 y.o. female who reports she was walking in her kitchen and passed out [place] WOKE UP ON THE FLOOR. SHE HAS A LARGE LACERATION ON THE LEFT SHOULDER AND COMPLAINS OF PAIN IN THE SHOULDERS WELL.pain in the shoulders worse with movement and goes with shields his shoulder study.   Past Medical History:  Diagnosis Date  . Asthma   . Breast cancer ([place]) 2009   left  . Cancer ([place])   . CHF (congestive heart failure) ([place])   . CHF (congestive heart failure) ([place])   . Chronic UTI   . COPD (chronic obstructive pulmonary disease) ([place])   . Dizziness   . Fibromyalgia   . Hypertension   . Neuropathy   . Personal history of tobacco use, presenting hazards to health 05/17/2015  . Polyp, larynx   . RA (rheumatoid arthritis) ([place])   . Sinus infection    recent  . Stumbling gait    to the left  . Supplemental oxygen dependent    2.5l    Patient Active Problem List   Diagnosis Date Noted  . Pneumonia of left lower lobe due to infectious organism ([place]) 11/04/2016  . Encounter for antineoplastic chemotherapy 07/24/2016  . Primary cancer of left lower lobe of lung ([place]) 06/25/2016  . Mixed hyperlipidemia 06/19/2016  . Peripheral neuropathy 06/19/2016  . Aneurysm of thoracic aorta ([place]) 04/29/2016  . Pain in limb 04/29/2016  . Acute delirium 03/06/2016  . Acute renal insufficiency 03/06/2016  . Pressure injury of skin 03/05/2016  . UTI (urinary tract infection) 03/04/2016  . Acute renal failure (ARF) ([place]) 12/04/2015  .  Malnutrition of moderate degree 11/24/2015  . Hyponatremia   . COPD (chronic obstructive pulmonary disease) ([place]) 11/15/2015  . Chronic systolic CHF (congestive heart failure) ([place]) 11/15/2015  . Descending thoracic aortic aneurysm ([place]) 09/11/2015  . Personal history of tobacco use, presenting hazards to health 05/17/2015  . Abnormal cardiovascular stress test 03/10/2014  . CAD (coronary artery disease) 02/25/2014  . Aneurysm of abdominal vessel ([place]) 02/24/2014  . Shortness of breath 02/24/2014  . Vocal cord polyp 02/13/2014  . Narcotic drug use 01/17/2013  . Fibromyalgia 05/11/2012  . Low back pain 05/11/2012  . Arthritis 11/03/2011  . Asthma 11/03/2011  . Breast cancer ([place]) 11/03/2011  . Osteoarthritis 06/17/2011    Past Surgical History:  Procedure Laterality Date  . ABDOMINAL HYSTERECTOMY    . BREAST LUMPECTOMY Left 2009   chemo and radiation  . CYST EXCISION Left 02/27/2015   Procedure: CYST REMOVAL;  Surgeon: Hessie Knows, MD;  Location: [facility] ORS;  Service: Orthopedics;  Laterality: Left;  . EYE MUSCLE SURGERY Right    13 surgeries  . FLEXIBLE BRONCHOSCOPY N/A 07/01/2016   Procedure: FLEXIBLE BRONCHOSCOPY;  Surgeon: Wilhelmina Mcardle, MD;  Location: [facility] ORS;  Service: Pulmonary;  Laterality: N/A;  . THUMB ARTHROSCOPY Left     Prior to Admission medications   Medication Sig Start Date End Date Taking? Authorizing Provider  Calcium-Vitamin D 600-200 MG-UNIT tablet Take 1 tablet by mouth daily.  Yes [provider]  carvedilol (COREG) 12.5 MG tablet Take 1 tablet (12.5 mg total) by mouth 2 (two) times daily with a meal. 11/29/15  Yes Hugelmeyer, Alexis, DO  cholecalciferol (VITAMIN D) 1000 units tablet Take 1,000 Units by mouth daily.   Yes [provider]  clonazePAM (KLONOPIN) 1 MG tablet Take 1 mg by mouth 2 (two) times daily as needed for anxiety.    Yes [provider]  gabapentin (NEURONTIN) 600 MG tablet Take 600 mg by mouth 3 (three)  times daily.  05/25/12  Yes [provider]  HYDROcodone-acetaminophen (NORCO/VICODIN) 5-325 MG tablet Take 1-2 tablets by mouth every 4 (four) hours as needed for moderate pain.  06/22/16  Yes [provider]  imipramine (TOFRANIL) 25 MG tablet Take 1 tablet by mouth at bedtime.  09/21/15  Yes [provider]  Multiple Vitamin (MULTIVITAMIN) capsule Take 1 capsule by mouth daily.   Yes [provider]  potassium chloride (K-DUR) 10 MEQ tablet Take 1 tablet (10 mEq total) by mouth daily. 09/16/16  Yes Karen Kitchens, NP  Tiotropium Bromide Monohydrate (SPIRIVA RESPIMAT) 2.5 MCG/ACT AERS Inhale 2.5 mcg into the lungs 2 (two) times daily. Patient taking differently: Inhale 2.5 mcg into the lungs daily.  06/26/16  Yes Kasa, Maretta Bees, MD  traMADol (ULTRAM) 50 MG tablet Take 50 mg by mouth daily.    Yes [provider]  vitamin C (ASCORBIC ACID) 500 MG tablet Take 500 mg by mouth daily.   Yes [provider]  ZINC SULFATE PO Take 1 tablet by mouth daily.    Yes [provider]  zolpidem (AMBIEN) 5 MG tablet Take 5 mg by mouth at bedtime.   Yes [provider]  benzonatate (TESSALON) 200 MG capsule Take 1 capsule (200 mg total) by mouth 3 (three) times daily as needed for cough. Patient not taking: Reported on 11/04/2016 10/28/16   Jacquelin Hawking, NP  dextromethorphan-guaiFENesin (TUSSIN DM) 10-100 MG/5ML liquid Take 5 mLs by mouth every 6 (six) hours as needed for cough. 09/16/16   Noreene Filbert, MD  nitroGLYCERIN (NITROSTAT) 0.4 MG SL tablet Place under the tongue.    [provider]  sucralfate (CARAFATE) 1 g tablet Take 1 tablet (1 g total) by mouth 3 (three) times daily. Patient not taking: Reported on 10/28/2016 07/29/16   Noreene Filbert, MD    Allergies Contrast media [iodinated diagnostic agents]; Ioxaglate; Sulfa antibiotics; Tetracycline; White petrolatum; Amoxicillin-pot clavulanate; and Tape  Family History    Problem Relation Age of Onset  . Diabetes Father   . Stroke Father   . Heart attack Father   . CAD Sister     Social History Social History   Tobacco Use  . Smoking status: Current Every Day Smoker    Packs/day: 1.00    Years: 40.00    Pack years: 40.00    Types: Cigarettes  . Smokeless tobacco: Never Used  Substance Use Topics  . Alcohol use: No  . Drug use: No    Review of Systems  Constitutional: No fever/chills Eyes: No visual changes. ENT: No sore throat. Cardiovascular: Denies chest pain. Respiratory: Denies shortness of breath. Gastrointestinal: No abdominal pain.  No nausea, no vomiting.  No diarrhea.  No constipation. Genitourinary: Negative for dysuria. Musculoskeletal: Negative for back pain. Skin: Negative for rash. Neurological: Negative for headaches, focal weakness   ____________________________________________   PHYSICAL EXAM:  VITAL SIGNS: ED Triage Vitals  Enc Vitals Group     BP 12/11/16  1553 (!) 91/53     Pulse Rate 12/11/16 1553 84     Resp 12/11/16 1553 20     Temp 12/11/16 1553 97.8 F (36.6 C)     Temp Source 12/11/16 1553 Oral     SpO2 12/11/16 1553 95 %     Weight 12/11/16 1553 112 lb (50.8 kg)     Height 12/11/16 1553 _0  (1.575 m)     Head Circumference --      Peak Flow --      Pain Score 12/11/16 1552 10     Pain Loc --      Pain Edu? --      Excl. in [place]? --     Constitutional: Alert and oriented. Well appearing and in no acute distress. Eyes: Conjunctivae are normal.  Head: Atraumatic Except for left frontal bruise and tender area. Nose: No congestion/rhinnorhea. Mouth/Throat: Mucous membranes are moist.  Oropharynx non-erythematous. Neck: No stridor.  Cardiovascular: Normal rate, regular rhythm. Grossly normal heart sounds.  Good peripheral circulation. Respiratory: Normal respiratory effort.  No retractions. Lungs CTAB. Gastrointestinal: Soft and nontender. No distention. No abdominal bruits. No CVA  tenderness. Musculoskeletal: No lower extremity tenderness nor edema.  No joint effusions. Neurologic:  Normal speech and language. No gross focal neurologic deficits are appreciated. No gait instability. Skin:  Skin is warm, dry and intact except for bruise on the forehead and 3-1/2 inch long laceration vertically from the anterior part of the axilla upwards. There is a definite in the skin just above and lateral to the laceration.. No rash noted. Psychiatric: Mood and affect are normal. Speech and behavior are normal.  ____________________________________________   LABS (all labs ordered are listed, but only abnormal results are displayed)  Labs Reviewed  BASIC METABOLIC PANEL - Abnormal; Notable for the following components:      Result Value   Sodium 134 (*)    Chloride 94 (*)    Glucose, Bld 108 (*)    Creatinine, Ser 1.13 (*)    Calcium 8.8 (*)    GFR calc non Af Amer 47 (*)    GFR calc Af Amer 54 (*)    All other components within normal limits  HEPATIC FUNCTION PANEL - Abnormal; Notable for the following components:   Albumin 3.2 (*)    ALT 10 (*)    Alkaline Phosphatase 141 (*)    Total Bilirubin 0.1 (*)    Bilirubin, Direct <0.1 (*)    All other components within normal limits  CBC  TROPONIN I  URINALYSIS, COMPLETE (UACMP) WITH MICROSCOPIC  CBG MONITORING, ED   ____________________________________________  EKG  EKG read and interpreted by me shows normal sinus rhythm rate of 77 normal axis there is flattening of the ST-T wave with slight downsloping and T-wave inversion in multiple leads compared to previous EKG from 08/07/2016 but EKG from 12/04/2015 shows much worse ST-T changes. ____________________________________________  RADIOLOGY  Ct Head Wo Contrast  Result Date: 12/11/2016 CLINICAL DATA:  76 y/o F; status post fall with bruise to the left forehead. EXAM: CT HEAD WITHOUT CONTRAST CT CERVICAL SPINE WITHOUT CONTRAST TECHNIQUE: Multidetector CT imaging of  the head and cervical spine was performed following the standard protocol without intravenous contrast. Multiplanar CT image reconstructions of the cervical spine were also generated. COMPARISON:  07/07/2016 MRI of the head. FINDINGS: CT HEAD FINDINGS Brain: No evidence of acute infarction, hemorrhage, hydrocephalus, extra-axial collection or mass lesion/mass effect. Stable mild chronic microvascular ischemic changes and moderate brain  parenchymal volume loss. Vascular: Calcific atherosclerosis of carotid siphons. Skull: Normal. Negative for fracture or focal lesion. Sinuses/Orbits: Right ethmoidectomy and maxillary antrostomy. Right anterior nasal cavity 12 mm mucous retention cyst or polyp. Opacification of right mastoid air in normal aeration of left mastoid air cells. Other: None. CT CERVICAL SPINE FINDINGS Alignment: Normal. Skull base and vertebrae: No acute fracture. No primary bone lesion or focal pathologic process. Soft tissues and spinal canal: No prevertebral fluid or swelling. No visible canal hematoma. Disc levels: Cervical spondylosis with prominent left-sided facet arthropathy. L2-3 left facet effusion, likely degenerative. Prominent osteophytosis of anterior C1-2 articulation. Upper chest: Moderate left pleural effusion. Other: Calcific atherosclerosis of carotid siphons. IMPRESSION: 1. No acute intracranial abnormality, calvarial fracture, or cervical fracture identified. 2. Stable chronic microvascular ischemic changes and parenchymal volume loss of the brain. 3. Stable cervical spondylosis with prominent left-sided facet arthropathy. Electronically Signed   By: Kristine Garbe M.D.   On: 12/11/2016 16:48   Ct Cervical Spine Wo Contrast  Result Date: 12/11/2016 CLINICAL DATA:  76 y/o F; status post fall with bruise to the left forehead. EXAM: CT HEAD WITHOUT CONTRAST CT CERVICAL SPINE WITHOUT CONTRAST TECHNIQUE: Multidetector CT imaging of the head and cervical spine was performed  following the standard protocol without intravenous contrast. Multiplanar CT image reconstructions of the cervical spine were also generated. COMPARISON:  07/07/2016 MRI of the head. FINDINGS: CT HEAD FINDINGS Brain: No evidence of acute infarction, hemorrhage, hydrocephalus, extra-axial collection or mass lesion/mass effect. Stable mild chronic microvascular ischemic changes and moderate brain parenchymal volume loss. Vascular: Calcific atherosclerosis of carotid siphons. Skull: Normal. Negative for fracture or focal lesion. Sinuses/Orbits: Right ethmoidectomy and maxillary antrostomy. Right anterior nasal cavity 12 mm mucous retention cyst or polyp. Opacification of right mastoid air in normal aeration of left mastoid air cells. Other: None. CT CERVICAL SPINE FINDINGS Alignment: Normal. Skull base and vertebrae: No acute fracture. No primary bone lesion or focal pathologic process. Soft tissues and spinal canal: No prevertebral fluid or swelling. No visible canal hematoma. Disc levels: Cervical spondylosis with prominent left-sided facet arthropathy. L2-3 left facet effusion, likely degenerative. Prominent osteophytosis of anterior C1-2 articulation. Upper chest: Moderate left pleural effusion. Other: Calcific atherosclerosis of carotid siphons. IMPRESSION: 1. No acute intracranial abnormality, calvarial fracture, or cervical fracture identified. 2. Stable chronic microvascular ischemic changes and parenchymal volume loss of the brain. 3. Stable cervical spondylosis with prominent left-sided facet arthropathy. Electronically Signed   By: Kristine Garbe M.D.   On: 12/11/2016 16:48   Dg Chest Portable 1 View  Result Date: 12/11/2016 CLINICAL DATA:  Syncope.  Fall.  COPD. EXAM: PORTABLE CHEST 1 VIEW COMPARISON:  Chest radiographs 10/28/2016 and CT 11/21/2016 FINDINGS: The cardiomediastinal silhouette is unchanged. Aortic atherosclerosis is noted. There is persistent dense retrocardiac opacity in the  left lower lobe which has mildly increased from the prior radiographs. Patchy and interstitial opacity more laterally in the left lung base is new or progressive, and there is a small left pleural effusion which may have also enlarged. No consolidation is seen in the right lung. There is underlying emphysema. No pneumothorax is identified. No acute osseous abnormality is seen. Surgical clips are present in the left chest wall. IMPRESSION: Worsening left lung base consolidation/atelectasis and small left pleural effusion. Electronically Signed   By: Logan Bores M.D.   On: 12/11/2016 16:42   CT of the head and neck show no acute disease the chest x-ray shows worsening left lung consolidation ___________________  shoulder  x-ray shows some haziness over the before meals joint but no apparent or patient's a obvious fracture. The pain is further back from the before meals joint._________________________   PROCEDURES  Procedure(s) performed:   oral consent was obtained skin was cleaned with Betadine skin was anesthetized with 1% lidocaine wound was explored and irrigated no foreign bodies were seen in the wound there was a laceration in the muscle which appears to be responsible for the divet. The fascia of the muscle was closed with 4-0 Prolene to make sure it stayed together. 3 stitches were used interrupted the skin was closed with 8 interrupted stitches. patient tolerated well  ____________________________________________   INITIAL IMPRESSION / ASSESSMENT AND PLAN / ED COURSE  patient had syncope and collapse with no warning this could be due to an arrhythmia additionally she had an episode of hypotension on arrival in the emergency room. Her chest x-ray looks like the pneumonia she had in the left lower lobe associated with lung cancer she had there may be worsening.initially patient had sustained a big laceration from her fall. I am worried if she falls again she might sustain further injury. I  believe the safest thing to do would be admit this patient further evaluate the changes in her left lower lung and evaluate the her for causes of syncope most worrisome of which would be cardiac.   please note I discussed with patient standing in the hospital she didn't want to stay but I thought he had a convinced that she should stay to avoid any possible injury from further syncope and the fact that the syncope could be due to a cardiac arrhythmia. However when the hospitalist want to talk to her she again said she wanted to go home she understands that she could pass out again but she feels fine and wants to be home with her dog and take care of her dog and she understood the risks involve especially the cardiac risk but again refused to stay.  ____________________________________________   FINAL CLINICAL IMPRESSION(S) / ED DIAGNOSES  Final diagnoses:  Syncope and collapse  Laceration of left upper extremity, initial encounter     ED Discharge Orders    None       Note:  This document was prepared using Dragon voice recognition software and may include unintentional dictation errors.    Nena Polio, MD 12/11/16 1912    Nena Polio, MD 12/11/16 1914    Nena Polio, MD 12/11/16 2020

## 2016-12-16 DIAGNOSIS — R0602 Shortness of breath: Secondary | ICD-10-CM | POA: Diagnosis not present

## 2016-12-16 DIAGNOSIS — I5022 Chronic systolic (congestive) heart failure: Secondary | ICD-10-CM | POA: Diagnosis not present

## 2016-12-17 DIAGNOSIS — N390 Urinary tract infection, site not specified: Secondary | ICD-10-CM | POA: Diagnosis not present

## 2016-12-17 DIAGNOSIS — E871 Hypo-osmolality and hyponatremia: Secondary | ICD-10-CM | POA: Diagnosis not present

## 2016-12-17 DIAGNOSIS — E782 Mixed hyperlipidemia: Secondary | ICD-10-CM | POA: Diagnosis not present

## 2016-12-17 DIAGNOSIS — R7309 Other abnormal glucose: Secondary | ICD-10-CM | POA: Diagnosis not present

## 2016-12-19 DIAGNOSIS — J449 Chronic obstructive pulmonary disease, unspecified: Secondary | ICD-10-CM | POA: Diagnosis not present

## 2016-12-19 DIAGNOSIS — Z4802 Encounter for removal of sutures: Secondary | ICD-10-CM | POA: Diagnosis not present

## 2016-12-19 DIAGNOSIS — J40 Bronchitis, not specified as acute or chronic: Secondary | ICD-10-CM | POA: Diagnosis not present

## 2016-12-23 ENCOUNTER — Encounter: Payer: Self-pay | Admitting: Internal Medicine

## 2016-12-23 DIAGNOSIS — J9611 Chronic respiratory failure with hypoxia: Secondary | ICD-10-CM

## 2016-12-25 DIAGNOSIS — J9611 Chronic respiratory failure with hypoxia: Secondary | ICD-10-CM | POA: Diagnosis not present

## 2016-12-26 ENCOUNTER — Other Ambulatory Visit: Payer: Self-pay | Admitting: Internal Medicine

## 2016-12-26 DIAGNOSIS — J449 Chronic obstructive pulmonary disease, unspecified: Secondary | ICD-10-CM

## 2016-12-26 NOTE — Progress Notes (Signed)
Patient aware results of ONO. She is aware she needs 3 Liters 02 qhs.

## 2017-01-02 DIAGNOSIS — N390 Urinary tract infection, site not specified: Secondary | ICD-10-CM | POA: Diagnosis not present

## 2017-01-02 DIAGNOSIS — I2510 Atherosclerotic heart disease of native coronary artery without angina pectoris: Secondary | ICD-10-CM | POA: Diagnosis not present

## 2017-01-02 DIAGNOSIS — C801 Malignant (primary) neoplasm, unspecified: Secondary | ICD-10-CM | POA: Diagnosis not present

## 2017-01-02 DIAGNOSIS — R918 Other nonspecific abnormal finding of lung field: Secondary | ICD-10-CM | POA: Diagnosis not present

## 2017-01-02 DIAGNOSIS — Z4802 Encounter for removal of sutures: Secondary | ICD-10-CM | POA: Diagnosis not present

## 2017-01-02 DIAGNOSIS — J449 Chronic obstructive pulmonary disease, unspecified: Secondary | ICD-10-CM | POA: Diagnosis not present

## 2017-01-14 ENCOUNTER — Other Ambulatory Visit: Payer: Self-pay

## 2017-01-14 ENCOUNTER — Other Ambulatory Visit: Payer: Self-pay | Admitting: *Deleted

## 2017-01-14 ENCOUNTER — Ambulatory Visit
Admission: RE | Admit: 2017-01-14 | Discharge: 2017-01-14 | Disposition: A | Discharge status: Home / Self Care | Payer: Medicare HMO | Source: Ambulatory Visit | Attending: Radiation Oncology | Admitting: Radiation Oncology

## 2017-01-14 ENCOUNTER — Encounter: Payer: Self-pay | Admitting: Radiation Oncology

## 2017-01-14 VITALS — BP 111/63 | HR 79 | Temp 97.2°F | Resp 21 | Wt 107.1 lb

## 2017-01-14 DIAGNOSIS — R918 Other nonspecific abnormal finding of lung field: Secondary | ICD-10-CM

## 2017-01-14 DIAGNOSIS — Z9221 Personal history of antineoplastic chemotherapy: Secondary | ICD-10-CM | POA: Diagnosis not present

## 2017-01-14 DIAGNOSIS — C3432 Malignant neoplasm of lower lobe, left bronchus or lung: Secondary | ICD-10-CM | POA: Insufficient documentation

## 2017-01-14 DIAGNOSIS — F17210 Nicotine dependence, cigarettes, uncomplicated: Secondary | ICD-10-CM | POA: Diagnosis not present

## 2017-01-14 DIAGNOSIS — Z923 Personal history of irradiation: Secondary | ICD-10-CM | POA: Diagnosis not present

## 2017-01-14 NOTE — Progress Notes (Signed)
Radiation Oncology Follow up Note  Name: [name] [name]   Date:   01/14/2017 MRN:  [id number] DOB: [birth year]    This 77 y.o. female presents to the clinic today for 4 month follow-up status post concurrent chemoradiation therapy for squamous cell lung cancer of the left lower lobe. Stage IIIa  REFERRING PROVIDER: Tracie Harrier, MD  HPI: Patient is a 77 year old female now 4 months out having completed concurrent radiation therapy and chemotherapy for stage IIIa squamous cell carcinoma the left lower lobe. She is seen today in routine follow-up and she states she is doing poor although she looks quite stable. She had imaging CT scan of the chest back in early November showing progressive collapse of the left lower lobe most likely related to a central obstructing mass. She specifically denies hemoptysis marked shortness of breath or productive cough.   COMPLICATIONS OF TREATMENT: none  FOLLOW UP COMPLIANCE: keeps appointments   PHYSICAL EXAM:  BP 111/63   Pulse 79   Temp (!) 97.2 F (36.2 C)   Resp (!) 21   Wt 107 lb 2.3 oz (48.6 kg)   BMI 19.60 kg/m  Well-developed well-nourished patient in NAD. HEENT reveals PERLA, EOMI, discs not visualized.  Oral cavity is clear. No oral mucosal lesions are identified. Neck is clear without evidence of cervical or supraclavicular adenopathy. Lungs are clear to A&P. Cardiac examination is essentially unremarkable with regular rate and rhythm without murmur rub or thrill. Abdomen is benign with no organomegaly or masses noted. Motor sensory and DTR levels are equal and symmetric in the upper and lower extremities. Cranial nerves II through XII are grossly intact. Proprioception is intact. No peripheral adenopathy or edema is identified. No motor or sensory levels are noted. Crude visual fields are within normal range.  RADIOLOGY RESULTS: Previous CT scan is reviewed and compatible with the above-stated findings I have ordered follow-up PET CT  scan  PLAN: At this time of ordered a PET CT scan to evaluate active disease versus scarring in her chest. I've also set up a follow-up appointment with medical oncology. Should to be progressive hypermetabolic activity in the chest a course of immunotherapy may be indicated. I've otherwise asked to see her back in 4 months for follow-up patient is agreeable to both scan follow-up appointment with medical oncology.  I would like to take this opportunity to thank you for allowing me to participate in the care of your patient.Noreene Filbert, MD

## 2017-01-16 DIAGNOSIS — I5022 Chronic systolic (congestive) heart failure: Secondary | ICD-10-CM | POA: Diagnosis not present

## 2017-01-16 DIAGNOSIS — R0602 Shortness of breath: Secondary | ICD-10-CM | POA: Diagnosis not present

## 2017-01-26 ENCOUNTER — Ambulatory Visit
Admission: RE | Admit: 2017-01-26 | Discharge: 2017-01-26 | Disposition: A | Discharge status: Home / Self Care | Payer: Medicare HMO | Source: Ambulatory Visit | Attending: Radiation Oncology | Admitting: Radiation Oncology

## 2017-01-26 DIAGNOSIS — R918 Other nonspecific abnormal finding of lung field: Secondary | ICD-10-CM

## 2017-01-27 ENCOUNTER — Ambulatory Visit
Admission: RE | Admit: 2017-01-27 | Discharge: 2017-01-27 | Disposition: A | Discharge status: Home / Self Care | Payer: Medicare HMO | Source: Ambulatory Visit | Attending: Radiation Oncology | Admitting: Radiation Oncology

## 2017-01-27 DIAGNOSIS — J9811 Atelectasis: Secondary | ICD-10-CM | POA: Diagnosis not present

## 2017-01-27 DIAGNOSIS — C3492 Malignant neoplasm of unspecified part of left bronchus or lung: Secondary | ICD-10-CM | POA: Diagnosis not present

## 2017-01-27 DIAGNOSIS — J90 Pleural effusion, not elsewhere classified: Secondary | ICD-10-CM | POA: Insufficient documentation

## 2017-01-27 DIAGNOSIS — J439 Emphysema, unspecified: Secondary | ICD-10-CM | POA: Insufficient documentation

## 2017-01-27 DIAGNOSIS — Z79899 Other long term (current) drug therapy: Secondary | ICD-10-CM | POA: Insufficient documentation

## 2017-01-27 DIAGNOSIS — R918 Other nonspecific abnormal finding of lung field: Secondary | ICD-10-CM | POA: Insufficient documentation

## 2017-01-27 LAB — GLUCOSE, CAPILLARY: GLUCOSE-CAPILLARY: 95 mg/dL (ref 65–99)

## 2017-01-27 MED ORDER — FLUDEOXYGLUCOSE F - 18 (FDG) INJECTION
12.0000 | Freq: Once | INTRAVENOUS | Status: AC | PRN
  Administered 2017-01-27: 12.64 via INTRAVENOUS

## 2017-02-02 ENCOUNTER — Encounter: Payer: Self-pay | Admitting: Internal Medicine

## 2017-02-02 ENCOUNTER — Inpatient Hospital Stay: Payer: Medicare HMO | Attending: Hematology and Oncology | Admitting: Hematology and Oncology

## 2017-02-02 ENCOUNTER — Ambulatory Visit (INDEPENDENT_AMBULATORY_CARE_PROVIDER_SITE_OTHER): Payer: Medicare HMO | Admitting: Internal Medicine

## 2017-02-02 VITALS — BP 104/70 | HR 95 | Resp 16 | Ht 62.0 in | Wt 110.0 lb

## 2017-02-02 VITALS — BP 124/85 | HR 91 | Temp 98.2°F | Resp 18 | Wt 111.0 lb

## 2017-02-02 DIAGNOSIS — Z853 Personal history of malignant neoplasm of breast: Secondary | ICD-10-CM | POA: Insufficient documentation

## 2017-02-02 DIAGNOSIS — J9611 Chronic respiratory failure with hypoxia: Secondary | ICD-10-CM

## 2017-02-02 DIAGNOSIS — Z882 Allergy status to sulfonamides status: Secondary | ICD-10-CM | POA: Insufficient documentation

## 2017-02-02 DIAGNOSIS — Z881 Allergy status to other antibiotic agents status: Secondary | ICD-10-CM | POA: Diagnosis not present

## 2017-02-02 DIAGNOSIS — M069 Rheumatoid arthritis, unspecified: Secondary | ICD-10-CM | POA: Insufficient documentation

## 2017-02-02 DIAGNOSIS — M797 Fibromyalgia: Secondary | ICD-10-CM | POA: Diagnosis not present

## 2017-02-02 DIAGNOSIS — F17210 Nicotine dependence, cigarettes, uncomplicated: Secondary | ICD-10-CM | POA: Diagnosis not present

## 2017-02-02 DIAGNOSIS — R1012 Left upper quadrant pain: Secondary | ICD-10-CM | POA: Diagnosis not present

## 2017-02-02 DIAGNOSIS — J9811 Atelectasis: Secondary | ICD-10-CM | POA: Diagnosis not present

## 2017-02-02 DIAGNOSIS — I110 Hypertensive heart disease with heart failure: Secondary | ICD-10-CM | POA: Diagnosis not present

## 2017-02-02 DIAGNOSIS — Z7189 Other specified counseling: Secondary | ICD-10-CM | POA: Insufficient documentation

## 2017-02-02 DIAGNOSIS — Z9981 Dependence on supplemental oxygen: Secondary | ICD-10-CM | POA: Insufficient documentation

## 2017-02-02 DIAGNOSIS — J449 Chronic obstructive pulmonary disease, unspecified: Secondary | ICD-10-CM | POA: Diagnosis not present

## 2017-02-02 DIAGNOSIS — R918 Other nonspecific abnormal finding of lung field: Secondary | ICD-10-CM | POA: Diagnosis not present

## 2017-02-02 DIAGNOSIS — I509 Heart failure, unspecified: Secondary | ICD-10-CM | POA: Diagnosis not present

## 2017-02-02 DIAGNOSIS — Z79899 Other long term (current) drug therapy: Secondary | ICD-10-CM | POA: Diagnosis not present

## 2017-02-02 DIAGNOSIS — Z880 Allergy status to penicillin: Secondary | ICD-10-CM | POA: Insufficient documentation

## 2017-02-02 DIAGNOSIS — C3432 Malignant neoplasm of lower lobe, left bronchus or lung: Secondary | ICD-10-CM

## 2017-02-02 DIAGNOSIS — J90 Pleural effusion, not elsewhere classified: Secondary | ICD-10-CM | POA: Insufficient documentation

## 2017-02-02 DIAGNOSIS — Z720 Tobacco use: Secondary | ICD-10-CM

## 2017-02-02 DIAGNOSIS — I700 Atherosclerosis of aorta: Secondary | ICD-10-CM | POA: Diagnosis not present

## 2017-02-02 NOTE — Progress Notes (Addendum)
* [facility]   Synopsis: Patient was diagnosed with squamous cell lung cancer on 07/01/16 by bronchoscopy.  She [id number] sqamous cell lung cancer  status post chemotherapy radiation.   Assessment and Plan:  Left lower lobe atelectasis secondary to lung cancer. - Currently the patient appears asymptomatic, her LLL atelectasis appears unchanged from previous CT in November 2018.  --Given her lack of recurrent pneumonia or baseline dyspnea, doubtful that stenting at this time would be helpful. Given her negative PET scan a diagnostic bronch is likely not necessary at this time. Will continue to monitor.   COPD, with chronic respiratory failure, dyspnea on exertion. - Continue Combivent.  Excessive daytime sleepiness.  --May be due to sleep apnea vs. Klonopin/ambien combo.  --Cut down on one of the above meds.   Nicotine Abuse.  --this is also likely contributing to her dyspnea.  --Discussed the importance of smoking cessation, spent 3 min in discussion.   Addendum: Discussed at lung cancer conference. It was noted that the patient has residual symptoms and there is concern for persistent cancer. Discussed with patient, will arrange from bronchoscopy to evaluate airways and sampling.  -DR.  Date: 02/02/2017  MRN# [id number] MARCELL PFEIFER 05-01-1940   [name] [name] is a 77 y.o. old female seen in follow up for chief complaint of  Chief Complaint  Patient presents with  . Follow-up    Dr. Mike Gip sent for eval of bronchoscopy.  . Cough    thick clear mucus    Subjective:  The patient is back today as follow up, her most recent PET scan shows persistent LLL atelectasis, however there is no residual uptake. She continues to smoke half ppd. She is on spiriva once daily, she feels that her breathing is doing well, and she does not feel limited by her breathing. She wears oxygen at 3L at night, she does not wear any during the day.   She is sleepy during the  day, she has been tested for OSA which was negative several years ago.   Images personally reviewed, PET scan 01/27/17 on comparison with CT chest 11/21/16 shows minimally changed LLL atelectasis with small pleural effusion in the left.   Desat walk 12/01/16; Baseline sat on RA at rest; sat 94% and HR 92;  After walking 360 feet she felt mild dyspnea, sat was 92% and HR 97.   CT chest 11/21/16, and comparison with previous on 06/11/16, there is complete left lower lobe atelectasis secondary to obstruction of the left lower lobe bronchus.  On previous CT chest and on previous bronchoscopy this was partially obstructed, this now appears to be completely obstructed.    Medication:    Current Outpatient Medications:  .  Calcium-Vitamin D 600-200 MG-UNIT tablet, Take 1 tablet by mouth daily. , Disp: , Rfl:  .  carvedilol (COREG) 12.5 MG tablet, Take 1 tablet (12.5 mg total) by mouth 2 (two) times daily with a meal., Disp: 60 tablet, Rfl: 0 .  cholecalciferol (VITAMIN D) 1000 units tablet, Take 1,000 Units by mouth daily., Disp: , Rfl:  .  clonazePAM (KLONOPIN) 1 MG tablet, Take 1 mg by mouth 2 (two) times daily as needed for anxiety. , Disp: , Rfl:  .  gabapentin (NEURONTIN) 600 MG tablet, Take 600 mg by mouth 3 (three) times daily. , Disp: , Rfl:  .  HYDROcodone-acetaminophen (NORCO/VICODIN) 5-325 MG tablet, Take 1-2 tablets by mouth every 4 (four) hours as needed for moderate pain. , Disp: , Rfl:  .  imipramine (TOFRANIL) 25 MG tablet, Take 1 tablet by mouth at bedtime. , Disp: , Rfl:  .  Multiple Vitamin (MULTIVITAMIN) capsule, Take 1 capsule by mouth daily., Disp: , Rfl:  .  nitroGLYCERIN (NITROSTAT) 0.4 MG SL tablet, Place under the tongue., Disp: , Rfl:  .  potassium chloride (K-DUR) 10 MEQ tablet, Take 1 tablet (10 mEq total) by mouth daily., Disp: 10 tablet, Rfl: 0 .  sucralfate (CARAFATE) 1 g tablet, Take 1 tablet (1 g total) by mouth 3 (three) times daily., Disp: 90 tablet, Rfl: 6 .   Tiotropium Bromide Monohydrate (SPIRIVA RESPIMAT) 2.5 MCG/ACT AERS, Inhale 2.5 mcg into the lungs 2 (two) times daily. (Patient taking differently: Inhale 2.5 mcg into the lungs daily. ), Disp: 1 Inhaler, Rfl: 0 .  traMADol (ULTRAM) 50 MG tablet, Take 50 mg by mouth daily. , Disp: , Rfl:  .  vitamin C (ASCORBIC ACID) 500 MG tablet, Take 500 mg by mouth daily., Disp: , Rfl:  .  ZINC SULFATE PO, Take 1 tablet by mouth daily. , Disp: , Rfl:  .  zolpidem (AMBIEN) 5 MG tablet, Take 5 mg by mouth at bedtime., Disp: , Rfl:   Allergies:  Contrast media [iodinated diagnostic agents]; Ioxaglate; Sulfa antibiotics; Tetracycline; White petrolatum; Amoxicillin-pot clavulanate; and Tape  Review of Systems: Gen:  Denies  fever, sweats. HEENT: Denies blurred vision. Cvc:  No dizziness, chest pain or heaviness Resp:   Denies cough or sputum porduction. Gi: Denies swallowing difficulty, stomach pain. constipation, bowel incontinence Gu:  Denies bladder incontinence, burning urine Ext:   No Joint pain, stiffness. Skin: No skin rash, easy bruising. Endoc:  No polyuria, polydipsia. Psych: No depression, insomnia. Other:  All other systems were reviewed and found to be negative other than what is mentioned in the HPI.   Physical Examination:   VS: BP 104/70 (BP Location: Left Arm, Cuff Size: Normal)   Pulse 95   Resp 16   Ht 5\' 2"  (1.575 m)   Wt 110 lb (49.9 kg)   SpO2 96%   BMI 20.12 kg/m   General Appearance: No distress  Neuro:without focal findings,  speech normal,  HEENT: PERRLA, EOM intact. Pulmonary: Decreased air entry left base. CardiovascularNormal S1,S2.  No m/r/g.   Abdomen: Benign, Soft, non-tender. Renal:  No costovertebral tenderness  GU:  Not performed at this time. Endoc: No evident thyromegaly, no signs of acromegaly. Skin:   warm, no rash. Extremities: normal, no cyanosis, clubbing.   LABORATORY PANEL:   CBC No results for input(s): WBC, HGB, HCT, PLT in the last 168  hours. ------------------------------------------------------------------------------------------------------------------  Chemistries  No results for input(s): NA, K, CL, CO2, GLUCOSE, BUN, CREATININE, CALCIUM, MG, AST, ALT, ALKPHOS, BILITOT in the last 168 hours.  Invalid input(s): GFRCGP ------------------------------------------------------------------------------------------------------------------  Cardiac Enzymes No results for input(s): TROPONINI in the last 168 hours. ------------------------------------------------------------  RADIOLOGY:   No results found for this or any previous visit. Results for orders placed during the hospital encounter of 10/28/16  DG Chest 2 View   Narrative CLINICAL DATA:  Off and chest congestion for the past 2 weeks with fever. History of COPD -asthma, current smoker, CHF.  EXAM: CHEST  2 VIEW  COMPARISON:  Chest x-ray of August 07, 2016  FINDINGS: The right lung is mildly hyperinflated. There is a tiny right pleural effusion. On the left there is a small pleural effusion. There is left basilar atelectasis or infiltrate. The heart and pulmonary vascularity are normal. There is calcification in the wall  of the aortic arch. There is mild multilevel degenerative disc disease of the thoracic spine.  IMPRESSION: Left lower lobe atelectasis or pneumonia with small left pleural effusion. Tiny right pleural effusion. Underlying COPD. No overt CHF. Followup PA and lateral chest X-ray is recommended in 3-4 weeks following trial of antibiotic therapy to ensure resolution and exclude underlying malignancy.  Thoracic aortic atherosclerosis.   Electronically Signed   By: David  Martinique M.D.   On: 10/28/2016 12:18    ------------------------------------------------------------------------------------------------------------------  Thank  you for allowing [facility] to assist in the care of your patient. Our  recommendations are noted above.  Please contact us if we can be of further service.   [name], MD.  [facility] Office Number: [phone]  Patricia Pesa, M.D.  Merton Border, M.D  02/02/2017

## 2017-02-02 NOTE — Progress Notes (Signed)
Patient states she sleeps all the time.  Appetite is about 50% of what it was.  States she eats what she wants or craves.  Right now eating a lot of salad. She had a fall about 2 months ago and cut her arm to the bone.

## 2017-02-02 NOTE — H&P (View-Only) (Signed)
* [facility]   Synopsis: Patient was diagnosed with squamous cell lung cancer on 07/01/16 by bronchoscopy.  She isT2bNxM0 sqamous cell lung cancer  status post chemotherapy radiation.   Assessment and Plan:  Left lower lobe atelectasis secondary to lung cancer. - Currently the patient appears asymptomatic, her LLL atelectasis appears unchanged from previous CT in November 2018.  --Given her lack of recurrent pneumonia or baseline dyspnea, doubtful that stenting at this time would be helpful. Given her negative PET scan a diagnostic bronch is likely not necessary at this time. Will continue to monitor.   COPD, with chronic respiratory failure, dyspnea on exertion. - Continue Combivent.  Excessive daytime sleepiness.  --May be due to sleep apnea vs. Klonopin/ambien combo.  --Cut down on one of the above meds.   Nicotine Abuse.  --this is also likely contributing to her dyspnea.  --Discussed the importance of smoking cessation, spent 3 min in discussion.   Addendum: Discussed at lung cancer conference. It was noted that the patient has residual symptoms and there is concern for persistent cancer. Discussed with patient, will arrange from bronchoscopy to evaluate airways and sampling.  -DR.  Date: 02/02/2017  MRN# [id number] [name] [name] 1940/03/18   [name] [name] is a 77 y.o. old female seen in follow up for chief complaint of  Chief Complaint  Patient presents with  . Follow-up    Dr. Mike Gip sent for eval of bronchoscopy.  . Cough    thick clear mucus    Subjective:  The patient is back today as follow up, her most recent PET scan shows persistent LLL atelectasis, however there is no residual uptake. She continues to smoke half ppd. She is on spiriva once daily, she feels that her breathing is doing well, and she does not feel limited by her breathing. She wears oxygen at 3L at night, she does not wear any during the day.   She is sleepy during the  day, she has been tested for OSA which was negative several years ago.   Images personally reviewed, PET scan 01/27/17 on comparison with CT chest 11/21/16 shows minimally changed LLL atelectasis with small pleural effusion in the left.   Desat walk 12/01/16; Baseline sat on RA at rest; sat 94% and HR 92;  After walking 360 feet she felt mild dyspnea, sat was 92% and HR 97.   CT chest 11/21/16, and comparison with previous on 06/11/16, there is complete left lower lobe atelectasis secondary to obstruction of the left lower lobe bronchus.  On previous CT chest and on previous bronchoscopy this was partially obstructed, this now appears to be completely obstructed.    Medication:    Current Outpatient Medications:  .  Calcium-Vitamin D 600-200 MG-UNIT tablet, Take 1 tablet by mouth daily. , Disp: , Rfl:  .  carvedilol (COREG) 12.5 MG tablet, Take 1 tablet (12.5 mg total) by mouth 2 (two) times daily with a meal., Disp: 60 tablet, Rfl: 0 .  cholecalciferol (VITAMIN D) 1000 units tablet, Take 1,000 Units by mouth daily., Disp: , Rfl:  .  clonazePAM (KLONOPIN) 1 MG tablet, Take 1 mg by mouth 2 (two) times daily as needed for anxiety. , Disp: , Rfl:  .  gabapentin (NEURONTIN) 600 MG tablet, Take 600 mg by mouth 3 (three) times daily. , Disp: , Rfl:  .  HYDROcodone-acetaminophen (NORCO/VICODIN) 5-325 MG tablet, Take 1-2 tablets by mouth every 4 (four) hours as needed for moderate pain. , Disp: , Rfl:  .  imipramine (TOFRANIL) 25 MG tablet, Take 1 tablet by mouth at bedtime. , Disp: , Rfl:  .  Multiple Vitamin (MULTIVITAMIN) capsule, Take 1 capsule by mouth daily., Disp: , Rfl:  .  nitroGLYCERIN (NITROSTAT) 0.4 MG SL tablet, Place under the tongue., Disp: , Rfl:  .  potassium chloride (K-DUR) 10 MEQ tablet, Take 1 tablet (10 mEq total) by mouth daily., Disp: 10 tablet, Rfl: 0 .  sucralfate (CARAFATE) 1 g tablet, Take 1 tablet (1 g total) by mouth 3 (three) times daily., Disp: 90 tablet, Rfl: 6 .   Tiotropium Bromide Monohydrate (SPIRIVA RESPIMAT) 2.5 MCG/ACT AERS, Inhale 2.5 mcg into the lungs 2 (two) times daily. (Patient taking differently: Inhale 2.5 mcg into the lungs daily. ), Disp: 1 Inhaler, Rfl: 0 .  traMADol (ULTRAM) 50 MG tablet, Take 50 mg by mouth daily. , Disp: , Rfl:  .  vitamin C (ASCORBIC ACID) 500 MG tablet, Take 500 mg by mouth daily., Disp: , Rfl:  .  ZINC SULFATE PO, Take 1 tablet by mouth daily. , Disp: , Rfl:  .  zolpidem (AMBIEN) 5 MG tablet, Take 5 mg by mouth at bedtime., Disp: , Rfl:   Allergies:  Contrast media [iodinated diagnostic agents]; Ioxaglate; Sulfa antibiotics; Tetracycline; White petrolatum; Amoxicillin-pot clavulanate; and Tape  Review of Systems: Gen:  Denies  fever, sweats. HEENT: Denies blurred vision. Cvc:  No dizziness, chest pain or heaviness Resp:   Denies cough or sputum porduction. Gi: Denies swallowing difficulty, stomach pain. constipation, bowel incontinence Gu:  Denies bladder incontinence, burning urine Ext:   No Joint pain, stiffness. Skin: No skin rash, easy bruising. Endoc:  No polyuria, polydipsia. Psych: No depression, insomnia. Other:  All other systems were reviewed and found to be negative other than what is mentioned in the HPI.   Physical Examination:   VS: BP 104/70 (BP Location: Left Arm, Cuff Size: Normal)   Pulse 95   Resp 16   Ht 5\' 2"  (1.575 m)   Wt 110 lb (49.9 kg)   SpO2 96%   BMI 20.12 kg/m   General Appearance: No distress  Neuro:without focal findings,  speech normal,  HEENT: PERRLA, EOM intact. Pulmonary: Decreased air entry left base. CardiovascularNormal S1,S2.  No m/r/g.   Abdomen: Benign, Soft, non-tender. Renal:  No costovertebral tenderness  GU:  Not performed at this time. Endoc: No evident thyromegaly, no signs of acromegaly. Skin:   warm, no rash. Extremities: normal, no cyanosis, clubbing.   LABORATORY PANEL:   CBC No results for input(s): WBC, HGB, HCT, PLT in the last 168  hours. ------------------------------------------------------------------------------------------------------------------  Chemistries  No results for input(s): NA, K, CL, CO2, GLUCOSE, BUN, CREATININE, CALCIUM, MG, AST, ALT, ALKPHOS, BILITOT in the last 168 hours.  Invalid input(s): GFRCGP ------------------------------------------------------------------------------------------------------------------  Cardiac Enzymes No results for input(s): TROPONINI in the last 168 hours. ------------------------------------------------------------  RADIOLOGY:   No results found for this or any previous visit. Results for orders placed during the hospital encounter of 10/28/16  DG Chest 2 View   Narrative CLINICAL DATA:  Off and chest congestion for the past 2 weeks with fever. History of COPD -asthma, current smoker, CHF.  EXAM: CHEST  2 VIEW  COMPARISON:  Chest x-ray of August 07, 2016  FINDINGS: The right lung is mildly hyperinflated. There is a tiny right pleural effusion. On the left there is a small pleural effusion. There is left basilar atelectasis or infiltrate. The heart and pulmonary vascularity are normal. There is calcification in the wall  of the aortic arch. There is mild multilevel degenerative disc disease of the thoracic spine.  IMPRESSION: Left lower lobe atelectasis or pneumonia with small left pleural effusion. Tiny right pleural effusion. Underlying COPD. No overt CHF. Followup PA and lateral chest X-ray is recommended in 3-4 weeks following trial of antibiotic therapy to ensure resolution and exclude underlying malignancy.  Thoracic aortic atherosclerosis.   Electronically Signed   By: David  Martinique M.D.   On: 10/28/2016 12:18    ------------------------------------------------------------------------------------------------------------------  Thank  you for allowing [facility] to assist in the care of your patient. Our  recommendations are noted above.  Please contact us if we can be of further service.   [name], MD.  [facility] Office Number: [phone]  Patricia Pesa, M.D.  Merton Border, M.D  02/02/2017

## 2017-02-02 NOTE — Patient Instructions (Signed)
Stop either the ambien or klonopin, or cut the klonopin in half.

## 2017-02-02 NOTE — Progress Notes (Signed)
[facility] day:  02/02/2017   Chief Complaint: [name] [name] is a 77 y.o. 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  HPI: The patient was last seen in the medical oncology clinic on 12/01/2016.  At that time, she was aching in all of her joints.  She had continued cough and congestion. She had UTI symptoms.  She denied fever.  Exam revealed decreased breath sounds in the left lower lobe.  Chest CT on 11/21/2016 revealed collapse of the LEFT lower lobe.  She was referred to pulmonary medicine for bronchoscopy.  She was seen by Dr. Alphonsus Sias on 12/01/2016.  A stent was discussed with referral to [facility].  She declined.  She was seen by Dr. Baruch Gouty on 01/14/2017.  He recommended a course of immunotherapy if there was progressive hypermetabolic activity in the chest.  PET scan on 01/27/2017 revealed interval response to therapy.  The previously noted hypermetabolism associated with left lower lobe perihilar lung mass has resolved in the interval.  There was a moderate left pleural effusion is increased in volume from previous PET-CT and there was now complete atelectasis/consolidation of the left lower lobe, which may obscure residual mass.  There was no new sites of hypermetabolism or evidence of distant metastatic disease.  Symptomatically, she has no energy.  She sleeps all of the time.  Quality of sleep is good.  She sleeps with oxygen.  She describes a week history of LUQ pain.  She has had no LUQ pain this week.  She has generalized achiness.  She denies any increased shortness of breath.  She describes coughing up a lot of thick mucus.   Past Medical History:  Diagnosis Date  . Asthma   . Breast cancer ([place]) 2009   left  . Cancer ([place])   . CHF (congestive heart failure) ([place])   . CHF (congestive heart failure) ([place])   .  Chronic UTI   . COPD (chronic obstructive pulmonary disease) ([place])   . Dizziness   . Fibromyalgia   . Hypertension   . Neuropathy   . Personal history of tobacco use, presenting hazards to health 05/17/2015  . Polyp, larynx   . RA (rheumatoid arthritis) ([place])   . Sinus infection    recent  . Stumbling gait    to the left  . Supplemental oxygen dependent    2.5l    Past Surgical History:  Procedure Laterality Date  . ABDOMINAL HYSTERECTOMY    . BREAST LUMPECTOMY Left 2009   chemo and radiation  . CYST EXCISION Left 02/27/2015   Procedure: CYST REMOVAL;  Surgeon: Hessie Knows, MD;  Location: [facility] ORS;  Service: Orthopedics;  Laterality: Left;  . EYE MUSCLE SURGERY Right    13 surgeries  . FLEXIBLE BRONCHOSCOPY N/A 07/01/2016   Procedure: FLEXIBLE BRONCHOSCOPY;  Surgeon: Wilhelmina Mcardle, MD;  Location: [facility] ORS;  Service: Pulmonary;  Laterality: N/A;  . THUMB ARTHROSCOPY Left     Family History  Problem Relation Age of Onset  . Diabetes Father   . Stroke Father   . Heart attack Father   . CAD Sister     Social History:  reports that she has been smoking cigarettes.  She has a 40.00 pack-year smoking history. she has never used smokeless tobacco. She reports that she does not drink alcohol or use drugs.  He lives  with her son and grandson. The patient is alone today.   Allergies:  Allergies  Allergen Reactions  . Contrast Media [Iodinated Diagnostic Agents] Other (See Comments)    Pt was sent to the ED following contrast media injection at [place]. Unknown reason. She has been premedicated since without complications. Pt to be premedicated prior to contrast media injections  . Ioxaglate Other (See Comments)    Pt was sent to the ED following contrast media injection at [place]. Unknown reason. She has been premedicated since without complications. Pt to be premedicated prior to contrast media injections  . Sulfa Antibiotics     Other reaction(s): Other  (See Comments)  . Tetracycline Hives and Other (See Comments)  . White Petrolatum Other (See Comments)  . Amoxicillin-Pot Clavulanate Rash    Other reaction(s): Unknown Other reaction(s): Unknown _0  Other reaction(s): Unknown Blisters in mouth Blisters in mouth  . Tape Rash    Current Medications: Current Outpatient Medications  Medication Sig Dispense Refill  . Calcium-Vitamin D 600-200 MG-UNIT tablet Take 1 tablet by mouth daily.     . carvedilol (COREG) 12.5 MG tablet Take 1 tablet (12.5 mg total) by mouth 2 (two) times daily with a meal. 60 tablet 0  . cholecalciferol (VITAMIN D) 1000 units tablet Take 1,000 Units by mouth daily.    . clonazePAM (KLONOPIN) 1 MG tablet Take 1 mg by mouth 2 (two) times daily as needed for anxiety.     . gabapentin (NEURONTIN) 600 MG tablet Take 600 mg by mouth 3 (three) times daily.     [name] HYDROcodone-acetaminophen (NORCO/VICODIN) 5-325 MG tablet Take 1-2 tablets by mouth every 4 (four) hours as needed for moderate pain.     [name] imipramine (TOFRANIL) 25 MG tablet Take 1 tablet by mouth at bedtime.     . Multiple Vitamin (MULTIVITAMIN) capsule Take 1 capsule by mouth daily.    . nitroGLYCERIN (NITROSTAT) 0.4 MG SL tablet Place under the tongue.    . potassium chloride (K-DUR) 10 MEQ tablet Take 1 tablet (10 mEq total) by mouth daily. 10 tablet 0  . Tiotropium Bromide Monohydrate (SPIRIVA RESPIMAT) 2.5 MCG/ACT AERS Inhale 2.5 mcg into the lungs 2 (two) times daily. (Patient taking differently: Inhale 2.5 mcg into the lungs daily. ) 1 Inhaler 0  . traMADol (ULTRAM) 50 MG tablet Take 50 mg by mouth daily.     . vitamin C (ASCORBIC ACID) 500 MG tablet Take 500 mg by mouth daily.    [name] ZINC SULFATE PO Take 1 tablet by mouth daily.     [name] zolpidem (AMBIEN) 5 MG tablet Take 5 mg by mouth at bedtime.    . sucralfate (CARAFATE) 1 g tablet Take 1 tablet (1 g total) by mouth 3  (three) times daily. 90 tablet 6   No current facility-administered medications for this visit.     Review of Systems:  GENERAL:  No energy.  Sleeps all of the time.  No fevers or sweats.  Weight down 1 pound. PERFORMANCE STATUS (ECOG):  2 HEENT:  No visual changes, runny nose, sore throat, mouth sores or tenderness. Lungs: Shortness of breath, stable.  Cough productive of copious thick secretions.  No hemoptysis.  On oxygen 2 liter/min via [place]. Cardiac:  No chest pain, palpitations, orthopnea, or PND. GI:   No nausea, vomiting, diarrhea, constipation, melena or hematochezia. GU:  No urgency, dysuria, or hematuria.  Musculoskeletal:  Fibromyalgia.  Osteoporosis.  Joints ache.   No muscle tenderness. Extremities:  No pain or swelling. Skin:  No rashes or skin changes. Neuro:  No headache, numbness or weakness, balance or coordination issues. Endocrine:  No diabetes, thyroid issues, hot flashes or night sweats. Psych:  No mood changes, depression or anxiety. Pain:  10/10; "whole body aches"  Review of systems:  All other systems reviewed and found to be negative.  Physical Exam: Blood pressure 124/85, pulse 91, temperature 98.2 F (36.8 C), temperature source Tympanic, resp. rate 18, weight 111 lb (50.3 kg), SpO2 93 %. O2 sats 95%. GENERAL:  Thin woman sitting comfortably in the exam room in no acute distress. MENTAL STATUS:  Alert and oriented to person, place and time. HEAD:  Short thin blonde hair.  Normocephalic, atraumatic, face symmetric, no Cushingoid features. EYES:  Blue eyes.  Pupils equal round and reactive to light and accomodation.  No conjunctivitis or scleral icterus. ENT:  Oropharynx clear without lesion.  Tongue normal. Mucous membranes moist.  RESPIRATORY:  Decreased respiratory excursion.  Decreased breath sounds LLL.  No rales, wheezes or rhonchi. CARDIOVASCULAR:  Regular rate and rhythm without murmur, rub or gallop. ABDOMEN:  Soft, non tender with active bowel  sounds and no hepatosplenomegaly.  No masses. SKIN:  No rashes, ulcers or lesions. EXTREMITIES: Arthritic changes in hands. Ankle edema.  No skin discoloration or tenderness.  No palpable cords. LYMPH NODES: No palpable cervical, supraclavicular, axillary or inguinal adenopathy  NEUROLOGICAL: Unremarkable. PSYCH:  Appropriate.   No visits with results within 3 Day(s) from this visit.  Latest known visit with results is:  Hospital Outpatient Visit on 01/27/2017  Component Date Value Ref Range Status  . Glucose-Capillary 01/27/2017 95  65 - 99 mg/dL Final    Assessment:  [name] [name] is a 77 y.o. female with a clinical T2bNxM0 sqamous cell lung cancer of the left lung s/p bronchoscopy and biopsy on 07/01/2016.  She has a 40 pack year smoking history.  She presented with left lower chest wall pain.  Chest CT with contrast on 06/11/2016 revealed a 3.2 x 3.0 mass like area of focal opacity in the medial left lower lobe with obliteration of segmental airways to the anterior left lower lobe.  PET scan on 06/27/2016 revealed a 4.3 x 3.2 cm central left lower lobe lung lesion (SUV 14.3) consistent with primary bronchogenic carcinoma.  There was equivocal nodal tissue in the subcarinal station demonstrating mild hypermetabolism (SUV 3.6). There was more peripheral left lower lobe increased atelectasis with mucoid impaction which is likely secondary to endobronchial obstruction.  There was a small left pleural effusion.  Head MRI on 07/07/2016 revealed no evidence of metastatic disease.  She received radiation from 07/22/2016 - 09/19/2016.  She received 5 weeks of concurrent carboplatin and Taxol (07/24/2016 - 08/11/2016; 09/05/2016 - 09/16/2016). She requires a reduced dose of Benadryl (25 mg) for her premedication.  Week #3 was postponed secondary to chest pain and evaluation.  She missed a couple of weeks.  Chest CT on 11/21/2016 revealed progressive collapse of the LEFT lower lobe presumably  related to central obstructing mass.  Mass obstructed the LEFT lower lobe bronchus, although mass was not well demonstrated.  There were small effusions (no change).  There was centrilobular emphysema unchanged.  There was no evidence pneumonia.  PET scan on 01/27/2017 revealed interval response to therapy.  The previously noted hypermetabolism associated with left lower lobe perihilar lung mass has resolved in the interval.  There  was a moderate left pleural effusion is increased in volume from previous PET-CT and there was now complete atelectasis/consolidation of the left lower lobe, which may obscure residual mass.  There was no new sites of hypermetabolism or evidence of distant metastatic disease.  She has a normocytic anemia likely due to chemotherapy.  Work-up on 07/24/2016 revealed the following normal labs: ferritin (103), B12 (345), folate(19.7).  Iron saturation was 8% and TIBC was 258.  She has a history of stage IA left breast cancer in 02/2005.  She underwent lumpectomy.  Pathology revealed a T1cN0 lesion.  Tumor was ER + , PR +, and Her2/neu -.  Two sentinel lymph nodes were negative.  She received chemotherapy (4 cycles of AC and possibly an abbreviated course of Taxol- no records available).  She received radiation.  She completed Femara in 08/2010.  She has fibromyalgia.  She has advanced thoracoabdominal aortic atherosclerosis with fusiform dilatation of the descending thoracic aorta up to 4.3 cm.  She is followed by Dr Lucky Cowboy.  CXR on 10/28/2016 revealed left lower lobe atelectasis or pneumonia with small left pleural effusion.  There was a tiny right pleural effusion and underlying COPD.  She is completed a course of Levaquin (last day on 11/05/2016) and then received azithromycin.  Symptomatically, she is aching in all of her joints.  She has continued cough and copious secretions.  She denies fever.  Exam reveals decreased breath sounds in the left lower lobe.    Plan: 1.  Discuss  interval PET scan- no evidence of increased hypermetabolism s/p concurrent chemotherapy and radiation. 2.  Review of scans with radiology.- can perform thoracentesis if symptomatic.  No utility of post thoracentesis imaging to assess hidden mass.  Consider bronchoscopy (endobronchial lesion or mucus plug).  Contact pulmonary medicine. 3.  Discuss prior consult with pulmonary medicine. Patient not interested in stent placement in [place].  She was unaware of the conversation.  She only notes transportation issues. 4.  Discuss bronchoscopy in [place] or [facility].  Stent can only be placed in [place]. 5.  Present at tumor board on 02/05/2017. 6.  Preauth Infinzi if no evidence of progressive disease. 7.  RTC in 2 weeks for MD assessment, labs (CBC with diff, CMP, Mg) and +/- Infinzi.    Lequita Asal, MD  02/02/2017, 3:35 PM

## 2017-02-05 ENCOUNTER — Other Ambulatory Visit: Payer: Self-pay | Admitting: Internal Medicine

## 2017-02-05 ENCOUNTER — Encounter: Payer: Self-pay | Admitting: Hematology and Oncology

## 2017-02-05 DIAGNOSIS — C3402 Malignant neoplasm of left main bronchus: Secondary | ICD-10-CM

## 2017-02-06 ENCOUNTER — Telehealth: Payer: Self-pay | Admitting: *Deleted

## 2017-02-06 NOTE — Telephone Encounter (Signed)
Bronchoscopy CPT [phone] for lung mass has been scheduled for 02/09/17

## 2017-02-06 NOTE — Telephone Encounter (Signed)
Patient aware arrival 12 noon. NPO after midnight. Must have driver.

## 2017-02-09 ENCOUNTER — Encounter
Admission: RE | Disposition: A | Discharge status: Home / Self Care | Payer: Self-pay | Source: Ambulatory Visit | Attending: Internal Medicine

## 2017-02-09 ENCOUNTER — Ambulatory Visit
Admission: RE | Admit: 2017-02-09 | Discharge: 2017-02-09 | Disposition: A | Discharge status: Home / Self Care | Payer: Medicare HMO | Source: Ambulatory Visit | Attending: Internal Medicine | Admitting: Internal Medicine

## 2017-02-09 DIAGNOSIS — Z79899 Other long term (current) drug therapy: Secondary | ICD-10-CM | POA: Diagnosis not present

## 2017-02-09 DIAGNOSIS — F17210 Nicotine dependence, cigarettes, uncomplicated: Secondary | ICD-10-CM | POA: Insufficient documentation

## 2017-02-09 DIAGNOSIS — Z882 Allergy status to sulfonamides status: Secondary | ICD-10-CM | POA: Diagnosis not present

## 2017-02-09 DIAGNOSIS — C3432 Malignant neoplasm of lower lobe, left bronchus or lung: Secondary | ICD-10-CM

## 2017-02-09 DIAGNOSIS — Z888 Allergy status to other drugs, medicaments and biological substances status: Secondary | ICD-10-CM | POA: Diagnosis not present

## 2017-02-09 DIAGNOSIS — Z881 Allergy status to other antibiotic agents status: Secondary | ICD-10-CM | POA: Diagnosis not present

## 2017-02-09 DIAGNOSIS — I509 Heart failure, unspecified: Secondary | ICD-10-CM | POA: Insufficient documentation

## 2017-02-09 DIAGNOSIS — I700 Atherosclerosis of aorta: Secondary | ICD-10-CM | POA: Diagnosis not present

## 2017-02-09 DIAGNOSIS — J449 Chronic obstructive pulmonary disease, unspecified: Secondary | ICD-10-CM | POA: Insufficient documentation

## 2017-02-09 DIAGNOSIS — J9610 Chronic respiratory failure, unspecified whether with hypoxia or hypercapnia: Secondary | ICD-10-CM | POA: Diagnosis not present

## 2017-02-09 DIAGNOSIS — J9811 Atelectasis: Secondary | ICD-10-CM | POA: Diagnosis not present

## 2017-02-09 DIAGNOSIS — Z880 Allergy status to penicillin: Secondary | ICD-10-CM | POA: Diagnosis not present

## 2017-02-09 DIAGNOSIS — C3402 Malignant neoplasm of left main bronchus: Secondary | ICD-10-CM

## 2017-02-09 HISTORY — PX: FLEXIBLE BRONCHOSCOPY: SHX5094

## 2017-02-09 SURGERY — BRONCHOSCOPY, FLEXIBLE
Anesthesia: Moderate Sedation

## 2017-02-09 MED ORDER — FENTANYL CITRATE (PF) 100 MCG/2ML IJ SOLN
INTRAMUSCULAR | Status: AC
  Filled 2017-02-09: qty 4

## 2017-02-09 MED ORDER — MIDAZOLAM HCL 5 MG/5ML IJ SOLN
INTRAMUSCULAR | Status: AC
  Filled 2017-02-09: qty 10

## 2017-02-09 MED ORDER — PHENYLEPHRINE HCL 0.25 % NA SOLN
1.0000 | Freq: Four times a day (QID) | NASAL | Status: DC | PRN
  Filled 2017-02-09: qty 15

## 2017-02-09 MED ORDER — FENTANYL CITRATE (PF) 100 MCG/2ML IJ SOLN
INTRAMUSCULAR | Status: AC | PRN
  Administered 2017-02-09: 25 ug via INTRAVENOUS
  Administered 2017-02-09: 50 ug via INTRAVENOUS
  Administered 2017-02-09 (×2): 25 ug via INTRAVENOUS

## 2017-02-09 MED ORDER — BUTAMBEN-TETRACAINE-BENZOCAINE 2-2-14 % EX AERO
1.0000 | INHALATION_SPRAY | Freq: Once | CUTANEOUS | Status: DC
  Filled 2017-02-09: qty 20

## 2017-02-09 MED ORDER — LIDOCAINE HCL 2 % EX GEL
1.0000 "application " | Freq: Once | CUTANEOUS | Status: DC
  Filled 2017-02-09: qty 5

## 2017-02-09 MED ORDER — MIDAZOLAM HCL 2 MG/2ML IJ SOLN
INTRAMUSCULAR | Status: AC | PRN
  Administered 2017-02-09: 1 mg via INTRAVENOUS
  Administered 2017-02-09 (×2): 2 mg via INTRAVENOUS

## 2017-02-09 NOTE — Op Note (Signed)
  [facility]            Bronchoscopy Note   FINDINGS/SUMMARY:  -No evidence of endobronchial tumor. -Stenosis seen at the entry to left lower lobe posterior segments, which could not be entered brushings were taken at these areas. - Mildly erythematous mucosa with copious secretions seen in the left lower lobe particularly. -Bronchoalveolar lavage was performed of the left lower lobe for cytology and microbiology.   Indication: Lung cancer The patient (or their representative) was informed of the risks (including but not limited to bleeding, infection, respiratory failure, lung injury, tooth/oral injury) and benefits of the procedure and gave consent, see chart.   Pre-op diagnosis: Lung cancer Post-op diagnosis: Chronic bronchitis. Estimated blood loss: Minimal  Medications for procedure: Fentanyl and Versed.  I personally spent a total of 30 minutes for the duration of the procedure supervising conscious sedation.  Procedure description: After obtaining informed consent a timeout was called to confirm the patient and the procedure.  Right and left nares were checked for patency, the right nares was more patent this was anesthetized with topical lidocaine.  Patient was given presedation, the bronchoscope was then advanced via the right nares to the posterior pharynx.  Normal movements of the vocal cords was appreciated.  Topical lidocaine was applied.  The bronchoscope was then taken to the carina, an anatomical tunnel was undertaken. There was mild stenosis of 2 posterior segments of the left lower lobe which could not be entered.  Brushings were performed in both of these areas.  Before brushings were performed topical epinephrine 2 cc was applied with good control of bleeding. Bronchoalveolar lavage was then performed. There was copious mucosa seen in the stenotic areas, and another areas of the left lower lobe.  There was also moderate to severely erythematous mucosa  in this area.    Condition post procedure: Stable   Complications: None noted    Deep Ashby Dawes, MD.  Board Certified in Internal Medicine, Pulmonary Medicine, [place], and Sleep Medicine.  [facility] Office Number: [phone]  Patricia Pesa, M.D.  Cheral Marker, M.D  02/09/2017

## 2017-02-09 NOTE — Interval H&P Note (Signed)
History and Physical Interval Note:  02/09/2017 12:11 PM  [name] [name]  has presented today for surgery, with the diagnosis of Regular Bronchoscopy    Lung mass  The various methods of treatment have been discussed with the patient and family. After consideration of risks, benefits and other options for treatment, the patient has consented to  Procedure(s): FLEXIBLE BRONCHOSCOPY (N/A) as a surgical intervention .  The patient's history has been reviewed, patient examined, no change in status, stable for surgery.  I have reviewed the patient's chart and labs.  Questions were answered to the patient's satisfaction.     [name]

## 2017-02-10 ENCOUNTER — Encounter: Payer: Self-pay | Admitting: Internal Medicine

## 2017-02-10 ENCOUNTER — Telehealth: Payer: Self-pay | Admitting: Internal Medicine

## 2017-02-10 LAB — CYTOLOGY - NON PAP

## 2017-02-10 NOTE — Telephone Encounter (Signed)
Call was transferred to me. I spoke with patient and advised per Dr. Juanell Fairly she may experience cough with blood tinged mucus and sore throat. Patient advised that she may gargle with warm salt water. She verbalized understanding and says she will call back tomorrow if no better.

## 2017-02-10 NOTE — Telephone Encounter (Signed)
Patient had a bronch yesterday and has continued bleeding when coughing   Please advise patient what she should do    Patient on hold for nurse

## 2017-02-11 LAB — CULTURE, BAL-QUANTITATIVE
CULTURE: NORMAL — AB
GRAM STAIN: NONE SEEN

## 2017-02-11 LAB — CULTURE, BAL-QUANTITATIVE W GRAM STAIN

## 2017-02-13 NOTE — Progress Notes (Deleted)
* [facility]   Synopsis: Patient was diagnosed with squamous cell lung cancer on 07/01/16 by bronchoscopy.  She isT2bNxM0 sqamous cell lung cancer  status post chemotherapy radiation.   Assessment and Plan:  Left lower lobe atelectasis secondary to lung cancer. - Currently the patient appears asymptomatic, her LLL atelectasis appears unchanged from previous CT in November 2018.  --Given her lack of recurrent pneumonia or baseline dyspnea, doubtful that stenting at this time would be helpful. Given her negative PET scan a diagnostic bronch is likely not necessary at this time. Will continue to monitor.   COPD, with chronic respiratory failure, dyspnea on exertion. - Continue Combivent.  Excessive daytime sleepiness.  --May be due to sleep apnea vs. Klonopin/ambien combo.  --Cut down on one of the above meds.   Nicotine Abuse.  --this is also likely contributing to her dyspnea.  --Discussed the importance of smoking cessation, spent 3 min in discussion.   Addendum: Discussed at lung cancer conference. It was noted that the patient has residual symptoms and there is concern for persistent cancer. Discussed with patient, will arrange from bronchoscopy to evaluate airways and sampling.  -DR.  Date: 02/13/2017  MRN# [id number] [name] [name] Aug 20, 1940   [name] [name] is a 77 y.o. old female seen in follow up for chief complaint of  No chief complaint on file.   Subjective:  The patient is back today as follow up, her most recent PET scan shows persistent LLL atelectasis, however there is no residual uptake. She underwent re-bronch on 02/09/17 which showed residual cancer in the LLL.   She wears oxygen at 3L at night, she does not wear any during the day.   She is sleepy during the day, she has been tested for OSA which was negative several years ago.   **Bronch 07/01/16 positive for SqCa.  **Bronch 02/09/17, positive for SqCa. **PET scan 01/27/17 on comparison  with CT chest 11/21/16 shows minimally changed LLL atelectasis with small pleural effusion in the left.   [place]at walk 12/01/16; Baseline sat on RA at rest; sat 94% and HR 92;  After walking 360 feet she felt mild dyspnea, sat was 92% and HR 97.   CT chest 11/21/16, and comparison with previous on 06/11/16, there is complete left lower lobe atelectasis secondary to obstruction of the left lower lobe bronchus.  On previous CT chest and on previous bronchoscopy this was partially obstructed, this now appears to be completely obstructed.    Medication:    Current Outpatient Medications:  .  Calcium-Vitamin D 600-200 MG-UNIT tablet, Take 1 tablet by mouth daily. , Disp: , Rfl:  .  carvedilol (COREG) 12.5 MG tablet, Take 1 tablet (12.5 mg total) by mouth 2 (two) times daily with a meal., Disp: 60 tablet, Rfl: 0 .  cholecalciferol (VITAMIN D) 1000 units tablet, Take 1,000 Units by mouth daily., Disp: , Rfl:  .  clonazePAM (KLONOPIN) 1 MG tablet, Take 1 mg by mouth 2 (two) times daily as needed for anxiety. , Disp: , Rfl:  .  gabapentin (NEURONTIN) 600 MG tablet, Take 600 mg by mouth 3 (three) times daily. , Disp: , Rfl:  .  HYDROcodone-acetaminophen (NORCO/VICODIN) 5-325 MG tablet, Take 1 tablet by mouth 2 (two) times daily. , Disp: , Rfl:  .  imipramine (TOFRANIL) 25 MG tablet, Take 25 mg by mouth at bedtime. , Disp: , Rfl:  .  Multiple Vitamin (MULTIVITAMIN) capsule, Take 1 capsule by mouth daily., Disp: , Rfl:  .  nitroGLYCERIN (NITROSTAT) 0.4 MG SL tablet, Place 0.4 mg under the tongue every 5 (five) minutes as needed for chest pain. , Disp: , Rfl:  .  potassium chloride (K-DUR) 10 MEQ tablet, Take 1 tablet (10 mEq total) by mouth daily., Disp: 10 tablet, Rfl: 0 .  sucralfate (CARAFATE) 1 g tablet, Take 1 tablet (1 g total) by mouth 3 (three) times daily. (Patient not taking: Reported on 02/06/2017), Disp: 90 tablet, Rfl: 6 .  Tiotropium Bromide Monohydrate (SPIRIVA RESPIMAT) 2.5 MCG/ACT AERS, Inhale 2.5  mcg into the lungs 2 (two) times daily. (Patient taking differently: Inhale 1 puff into the lungs 2 (two) times daily. ), Disp: 1 Inhaler, Rfl: 0 .  traMADol (ULTRAM) 50 MG tablet, Take 100 mg by mouth daily. , Disp: , Rfl:  .  vitamin C (ASCORBIC ACID) 500 MG tablet, Take 500 mg by mouth daily., Disp: , Rfl:  .  ZINC SULFATE PO, Take 1 tablet by mouth daily. , Disp: , Rfl:  .  zolpidem (AMBIEN) 5 MG tablet, Take 5 mg by mouth at bedtime., Disp: , Rfl:   Allergies:  Contrast media [iodinated diagnostic agents]; Ioxaglate; Sulfa antibiotics; Tetracycline; White petrolatum; Amoxicillin-pot clavulanate; and Tape  Review of Systems: Gen:  Denies  fever, sweats. HEENT: Denies blurred vision. Cvc:  No dizziness, chest pain or heaviness Resp:   Denies cough or sputum porduction. Gi: Denies swallowing difficulty, stomach pain. constipation, bowel incontinence Gu:  Denies bladder incontinence, burning urine Ext:   No Joint pain, stiffness. Skin: No skin rash, easy bruising. Endoc:  No polyuria, polydipsia. Psych: No depression, insomnia. Other:  All other systems were reviewed and found to be negative other than what is mentioned in the HPI.   Physical Examination:   VS: There were no vitals taken for this visit.  General Appearance: No distress  Neuro:without focal findings,  speech normal,  HEENT: PERRLA, EOM intact. Pulmonary: Decreased air entry left base. CardiovascularNormal S1,S2.  No m/r/g.   Abdomen: Benign, Soft, non-tender. Renal:  No costovertebral tenderness  GU:  Not performed at this time. Endoc: No evident thyromegaly, no signs of acromegaly. Skin:   warm, no rash. Extremities: normal, no cyanosis, clubbing.   LABORATORY PANEL:   CBC No results for input(s): WBC, HGB, HCT, PLT in the last 168 hours. ------------------------------------------------------------------------------------------------------------------  Chemistries  No results for input(s): NA, K, CL,  CO2, GLUCOSE, BUN, CREATININE, CALCIUM, MG, AST, ALT, ALKPHOS, BILITOT in the last 168 hours.  Invalid input(s): GFRCGP ------------------------------------------------------------------------------------------------------------------  Cardiac Enzymes No results for input(s): TROPONINI in the last 168 hours. ------------------------------------------------------------  RADIOLOGY:   No results found for this or any previous visit. Results for orders placed during the hospital encounter of 10/28/16  DG Chest 2 View   Narrative CLINICAL DATA:  Off and chest congestion for the past 2 weeks with fever. History of COPD -asthma, current smoker, CHF.  EXAM: CHEST  2 VIEW  COMPARISON:  Chest x-ray of August 07, 2016  FINDINGS: The right lung is mildly hyperinflated. There is a tiny right pleural effusion. On the left there is a small pleural effusion. There is left basilar atelectasis or infiltrate. The heart and pulmonary vascularity are normal. There is calcification in the wall of the aortic arch. There is mild multilevel degenerative disc disease of the thoracic spine.  IMPRESSION: Left lower lobe atelectasis or pneumonia with small left pleural effusion. Tiny right pleural effusion. Underlying COPD. No overt CHF. Followup PA and lateral chest X-ray is recommended in 3-4  weeks following trial of antibiotic therapy to ensure resolution and exclude underlying malignancy.  Thoracic aortic atherosclerosis.   Electronically Signed   By: David  Martinique M.D.   On: 10/28/2016 12:18    ------------------------------------------------------------------------------------------------------------------  Thank  you for allowing [facility] to assist in the care of your patient. Our recommendations are noted above.  Please contact us if we can be of further service.   [name], MD.  [facility] Office Number: [phone]  Patricia Pesa, M.D.  Merton Border, M.D  02/13/2017

## 2017-02-16 ENCOUNTER — Ambulatory Visit: Payer: Medicare HMO | Admitting: Internal Medicine

## 2017-02-16 ENCOUNTER — Ambulatory Visit (INDEPENDENT_AMBULATORY_CARE_PROVIDER_SITE_OTHER): Payer: Medicare HMO | Admitting: Internal Medicine

## 2017-02-16 ENCOUNTER — Other Ambulatory Visit: Payer: Self-pay | Admitting: Hematology and Oncology

## 2017-02-16 ENCOUNTER — Inpatient Hospital Stay: Payer: Medicare HMO

## 2017-02-16 ENCOUNTER — Encounter: Payer: Self-pay | Admitting: Hematology and Oncology

## 2017-02-16 ENCOUNTER — Encounter: Payer: Self-pay | Admitting: Internal Medicine

## 2017-02-16 ENCOUNTER — Inpatient Hospital Stay: Payer: Medicare HMO | Attending: Hematology and Oncology

## 2017-02-16 ENCOUNTER — Inpatient Hospital Stay (HOSPITAL_BASED_OUTPATIENT_CLINIC_OR_DEPARTMENT_OTHER): Payer: Medicare HMO | Admitting: Hematology and Oncology

## 2017-02-16 ENCOUNTER — Telehealth: Payer: Self-pay | Admitting: *Deleted

## 2017-02-16 VITALS — BP 132/76 | HR 86 | Temp 98.2°F | Resp 16 | Wt 112.7 lb

## 2017-02-16 VITALS — BP 118/76 | HR 88 | Resp 16 | Ht 62.0 in | Wt 113.0 lb

## 2017-02-16 DIAGNOSIS — I509 Heart failure, unspecified: Secondary | ICD-10-CM | POA: Insufficient documentation

## 2017-02-16 DIAGNOSIS — R5383 Other fatigue: Secondary | ICD-10-CM | POA: Diagnosis not present

## 2017-02-16 DIAGNOSIS — J90 Pleural effusion, not elsewhere classified: Secondary | ICD-10-CM | POA: Diagnosis not present

## 2017-02-16 DIAGNOSIS — D649 Anemia, unspecified: Secondary | ICD-10-CM | POA: Insufficient documentation

## 2017-02-16 DIAGNOSIS — Z853 Personal history of malignant neoplasm of breast: Secondary | ICD-10-CM | POA: Insufficient documentation

## 2017-02-16 DIAGNOSIS — M898X8 Other specified disorders of bone, other site: Secondary | ICD-10-CM

## 2017-02-16 DIAGNOSIS — Z720 Tobacco use: Secondary | ICD-10-CM

## 2017-02-16 DIAGNOSIS — I5022 Chronic systolic (congestive) heart failure: Secondary | ICD-10-CM | POA: Diagnosis not present

## 2017-02-16 DIAGNOSIS — Z170 Estrogen receptor positive status [ER+]: Secondary | ICD-10-CM | POA: Diagnosis not present

## 2017-02-16 DIAGNOSIS — I110 Hypertensive heart disease with heart failure: Secondary | ICD-10-CM | POA: Insufficient documentation

## 2017-02-16 DIAGNOSIS — Z923 Personal history of irradiation: Secondary | ICD-10-CM | POA: Insufficient documentation

## 2017-02-16 DIAGNOSIS — M797 Fibromyalgia: Secondary | ICD-10-CM | POA: Insufficient documentation

## 2017-02-16 DIAGNOSIS — R7989 Other specified abnormal findings of blood chemistry: Secondary | ICD-10-CM | POA: Diagnosis not present

## 2017-02-16 DIAGNOSIS — C3432 Malignant neoplasm of lower lobe, left bronchus or lung: Secondary | ICD-10-CM | POA: Diagnosis not present

## 2017-02-16 DIAGNOSIS — Z9221 Personal history of antineoplastic chemotherapy: Secondary | ICD-10-CM | POA: Diagnosis not present

## 2017-02-16 DIAGNOSIS — J9811 Atelectasis: Secondary | ICD-10-CM

## 2017-02-16 DIAGNOSIS — R61 Generalized hyperhidrosis: Secondary | ICD-10-CM | POA: Insufficient documentation

## 2017-02-16 DIAGNOSIS — R042 Hemoptysis: Secondary | ICD-10-CM | POA: Diagnosis not present

## 2017-02-16 DIAGNOSIS — J449 Chronic obstructive pulmonary disease, unspecified: Secondary | ICD-10-CM | POA: Diagnosis not present

## 2017-02-16 DIAGNOSIS — Z7189 Other specified counseling: Secondary | ICD-10-CM

## 2017-02-16 DIAGNOSIS — Z9223 Personal history of estrogen therapy: Secondary | ICD-10-CM | POA: Insufficient documentation

## 2017-02-16 DIAGNOSIS — F17210 Nicotine dependence, cigarettes, uncomplicated: Secondary | ICD-10-CM | POA: Diagnosis not present

## 2017-02-16 DIAGNOSIS — M069 Rheumatoid arthritis, unspecified: Secondary | ICD-10-CM | POA: Diagnosis not present

## 2017-02-16 DIAGNOSIS — R0602 Shortness of breath: Secondary | ICD-10-CM | POA: Diagnosis not present

## 2017-02-16 DIAGNOSIS — Z9981 Dependence on supplemental oxygen: Secondary | ICD-10-CM | POA: Diagnosis not present

## 2017-02-16 DIAGNOSIS — R6883 Chills (without fever): Secondary | ICD-10-CM

## 2017-02-16 DIAGNOSIS — J9611 Chronic respiratory failure with hypoxia: Secondary | ICD-10-CM | POA: Diagnosis not present

## 2017-02-16 DIAGNOSIS — Z79899 Other long term (current) drug therapy: Secondary | ICD-10-CM | POA: Insufficient documentation

## 2017-02-16 DIAGNOSIS — Z880 Allergy status to penicillin: Secondary | ICD-10-CM | POA: Insufficient documentation

## 2017-02-16 DIAGNOSIS — Z882 Allergy status to sulfonamides status: Secondary | ICD-10-CM | POA: Diagnosis not present

## 2017-02-16 DIAGNOSIS — C801 Malignant (primary) neoplasm, unspecified: Secondary | ICD-10-CM | POA: Insufficient documentation

## 2017-02-16 LAB — COMPREHENSIVE METABOLIC PANEL
ALT: 10 U/L — ABNORMAL LOW (ref 14–54)
AST: 21 U/L (ref 15–41)
Albumin: 3.9 g/dL (ref 3.5–5.0)
Alkaline Phosphatase: 128 U/L — ABNORMAL HIGH (ref 38–126)
Anion gap: 12 (ref 5–15)
BUN: 15 mg/dL (ref 6–20)
CO2: 26 mmol/L (ref 22–32)
Calcium: 9.3 mg/dL (ref 8.9–10.3)
Chloride: 93 mmol/L — ABNORMAL LOW (ref 101–111)
Creatinine, Ser: 1.18 mg/dL — ABNORMAL HIGH (ref 0.44–1.00)
GFR calc Af Amer: 51 mL/min — ABNORMAL LOW (ref >60)
GFR calc non Af Amer: 44 mL/min — ABNORMAL LOW (ref >60)
Glucose, Bld: 101 mg/dL — ABNORMAL HIGH (ref 65–99)
Potassium: 4.3 mmol/L (ref 3.5–5.1)
Sodium: 131 mmol/L — ABNORMAL LOW (ref 135–145)
Total Bilirubin: 0.5 mg/dL (ref 0.3–1.2)
Total Protein: 7.8 g/dL (ref 6.5–8.1)

## 2017-02-16 LAB — CBC WITH DIFFERENTIAL/PLATELET
Basophils Absolute: 0.1 10*3/uL (ref 0–0.1)
Basophils Relative: 1 %
Eosinophils Absolute: 0.3 10*3/uL (ref 0–0.7)
Eosinophils Relative: 3 %
HCT: 36.9 % (ref 35.0–47.0)
Hemoglobin: 12.4 g/dL (ref 12.0–16.0)
Lymphocytes Relative: 11 %
Lymphs Abs: 1 10*3/uL (ref 1.0–3.6)
MCH: 30.4 pg (ref 26.0–34.0)
MCHC: 33.6 g/dL (ref 32.0–36.0)
MCV: 90.5 fL (ref 80.0–100.0)
Monocytes Absolute: 0.7 10*3/uL (ref 0.2–0.9)
Monocytes Relative: 7 %
Neutro Abs: 7.7 10*3/uL — ABNORMAL HIGH (ref 1.4–6.5)
Neutrophils Relative %: 78 %
Platelets: 204 10*3/uL (ref 150–440)
RBC: 4.07 MIL/uL (ref 3.80–5.20)
RDW: 15.9 % — ABNORMAL HIGH (ref 11.5–14.5)
WBC: 9.8 10*3/uL (ref 3.6–11.0)

## 2017-02-16 LAB — MAGNESIUM: Magnesium: 2 mg/dL (ref 1.7–2.4)

## 2017-02-16 MED ORDER — AZITHROMYCIN 250 MG PO TABS: 250.0000 mg | ORAL_TABLET | Freq: Every day | ORAL | 0 refills | Status: DC

## 2017-02-16 MED ORDER — ALBUTEROL SULFATE HFA 108 (90 BASE) MCG/ACT IN AERS: 1.0000 | INHALATION_SPRAY | Freq: Four times a day (QID) | RESPIRATORY_TRACT | 2 refills | Status: AC | PRN

## 2017-02-16 NOTE — Telephone Encounter (Signed)
Called patient aware of Location, date and time of her  appt scheduled on 02/17/17. Patient is aware.

## 2017-02-16 NOTE — Progress Notes (Signed)
* [facility]   Synopsis: Patient was diagnosed with squamous cell lung cancer on 07/01/16 by bronchoscopy.  She [id number] sqamous cell lung cancer  status post chemotherapy radiation with persistence seen on bronch on 02/09/17   Assessment and Plan:  Left lower lobe atelectasis secondary to lung cancer. - Currently the patient appears asymptomatic, her LLL atelectasis appears unchanged from previous CT in November 2018.  --Continue chemo per Oncology  COPD, with chronic respiratory failure, dyspnea on exertion. - Continue Spiriva, added rescue inhaler.  --Asked to use mucinex-D, and daily azithromycin 250 mg daily.   Excessive daytime sleepiness.  --May be due to sleep apnea vs. Klonopin/ambien combo.  --Cut down on one of the above meds.   Nicotine Abuse.  --this is also likely contributing to her dyspnea.  --Discussed the importance of smoking cessation, spent 3 min in discussion.   Meds ordered this encounter  Medications  . azithromycin (ZITHROMAX) 250 MG tablet    Sig: Take 1 tablet (250 mg total) by mouth daily. Take once daily.    Dispense:  30 tablet    Refill:  0  . albuterol (PROVENTIL HFA;VENTOLIN HFA) 108 (90 Base) MCG/ACT inhaler    Sig: Inhale 1-2 puffs into the lungs every 6 (six) hours as needed for wheezing or shortness of breath.    Dispense:  1 Inhaler    Refill:  2   Return in about 3 months (around 05/16/2017).  Date: 02/16/2017  MRN# [id number] [name] [name] 1940-02-23   [name] [name] is a 77 y.o. old female seen in follow up for chief complaint of  Chief Complaint  Patient presents with  . Follow-up    1 week f/u bronch.  . Cough    pt still has a sore throat.  . Back Pain    lower back pain.    Subjective:  The patient is back today as follow up, her most recent PET scan shows persistent LLL atelectasis, however there is no residual uptake. She underwent re-bronch on 02/09/17 which showed residual cancer in the LLL.   She  wears oxygen at 3L at night, she does not wear any during the day.  She was seen by Dr. Mike Gip in oncology earlier today, and discussed further treatment options for her persistent cancer, as well as further testing. After her bronchoscopy she had some hemotysis, which has since resolved. She continues to cough up some thick sputum.  She is doing spiriva respimat once puff daily, sometimes she does one puff bid. She does not have a rescue inhaler.   She is sleepy during the day, she has been tested for OSA which was negative several years ago.   **Bronch 07/01/16 positive for SqCa.  **Re-Bronch 02/09/17, positive for SqCa. **PET scan 01/27/17 on comparison with CT chest 11/21/16 shows minimally changed LLL atelectasis with small pleural effusion in the left.   Desat walk 12/01/16; Baseline sat on RA at rest; sat 94% and HR 92;  After walking 360 feet she felt mild dyspnea, sat was 92% and HR 97.   CT chest 11/21/16, and comparison with previous on 06/11/16, there is complete left lower lobe atelectasis secondary to obstruction of the left lower lobe bronchus.  On previous CT chest and on previous bronchoscopy this was partially obstructed, this now appears to be completely obstructed.    Medication:    Current Outpatient Medications:  .  Calcium-Vitamin D 600-200 MG-UNIT tablet, Take 1 tablet by mouth daily. , Disp: ,  Rfl:  .  carvedilol (COREG) 12.5 MG tablet, Take 1 tablet (12.5 mg total) by mouth 2 (two) times daily with a meal. (Patient not taking: Reported on 02/16/2017), Disp: 60 tablet, Rfl: 0 .  cholecalciferol (VITAMIN D) 1000 units tablet, Take 1,000 Units by mouth daily., Disp: , Rfl:  .  clonazePAM (KLONOPIN) 1 MG tablet, Take 1 mg by mouth 2 (two) times daily as needed for anxiety. , Disp: , Rfl:  .  gabapentin (NEURONTIN) 600 MG tablet, Take 600 mg by mouth 3 (three) times daily. , Disp: , Rfl:  .  HYDROcodone-acetaminophen (NORCO/VICODIN) 5-325 MG tablet, Take 1 tablet by mouth 2  (two) times daily. , Disp: , Rfl:  .  imipramine (TOFRANIL) 25 MG tablet, Take 25 mg by mouth at bedtime. , Disp: , Rfl:  .  Multiple Vitamin (MULTIVITAMIN) capsule, Take 1 capsule by mouth daily., Disp: , Rfl:  .  nitroGLYCERIN (NITROSTAT) 0.4 MG SL tablet, Place 0.4 mg under the tongue every 5 (five) minutes as needed for chest pain. , Disp: , Rfl:  .  potassium chloride (K-DUR) 10 MEQ tablet, Take 1 tablet (10 mEq total) by mouth daily. (Patient not taking: Reported on 02/16/2017), Disp: 10 tablet, Rfl: 0 .  sucralfate (CARAFATE) 1 g tablet, Take 1 tablet (1 g total) by mouth 3 (three) times daily. (Patient not taking: Reported on 02/16/2017), Disp: 90 tablet, Rfl: 6 .  Tiotropium Bromide Monohydrate (SPIRIVA RESPIMAT) 2.5 MCG/ACT AERS, Inhale 2.5 mcg into the lungs 2 (two) times daily. (Patient not taking: Reported on 02/16/2017), Disp: 1 Inhaler, Rfl: 0 .  traMADol (ULTRAM) 50 MG tablet, Take 100 mg by mouth daily. , Disp: , Rfl:  .  vitamin C (ASCORBIC ACID) 500 MG tablet, Take 500 mg by mouth daily., Disp: , Rfl:  .  ZINC SULFATE PO, Take 1 tablet by mouth daily. , Disp: , Rfl:  .  zolpidem (AMBIEN) 5 MG tablet, Take 5 mg by mouth at bedtime., Disp: , Rfl:   Allergies:  Contrast media [iodinated diagnostic agents]; Ioxaglate; Sulfa antibiotics; Tetracycline; White petrolatum; Amoxicillin-pot clavulanate; and Tape  Review of Systems: Gen:  Denies  fever, sweats. HEENT: Denies blurred vision. Cvc:  No dizziness, chest pain or heaviness Resp:   Denies cough or sputum porduction. Gi: Denies swallowing difficulty, stomach pain. constipation, bowel incontinence Gu:  Denies bladder incontinence, burning urine Ext:   No Joint pain, stiffness. Skin: No skin rash, easy bruising. Endoc:  No polyuria, polydipsia. Psych: No depression, insomnia. Other:  All other systems were reviewed and found to be negative other than what is mentioned in the HPI.   Physical Examination:   VS: BP 118/76 (BP  Location: Left Arm, Cuff Size: Normal)   Pulse 88   Resp 16   Ht 5\' 2"  (1.575 m)   Wt 113 lb (51.3 kg)   SpO2 93%   BMI 20.67 kg/m   General Appearance: No distress  Neuro:without focal findings,  speech normal,  HEENT: PERRLA, EOM intact. Pulmonary: Continued Decreased air entry left base. CardiovascularNormal S1,S2.  No m/r/g.   Abdomen: Benign, Soft, non-tender. Renal:  No costovertebral tenderness  GU:  Not performed at this time. Endoc: No evident thyromegaly, no signs of acromegaly. Skin:   warm, no rash. Extremities: normal, no cyanosis, clubbing.   LABORATORY PANEL:   CBC Recent Labs  Lab 02/16/17 1005  WBC 9.8  HGB 12.4  HCT 36.9  PLT 204   ------------------------------------------------------------------------------------------------------------------  Chemistries  Recent Labs  Lab 02/16/17 1005  NA 131*  K 4.3  CL 93*  CO2 26  GLUCOSE 101*  BUN 15  CREATININE 1.18*  CALCIUM 9.3  MG 2.0  AST 21  ALT 10*  ALKPHOS 128*  BILITOT 0.5   ------------------------------------------------------------------------------------------------------------------  Cardiac Enzymes No results for input(s): TROPONINI in the last 168 hours. ------------------------------------------------------------  RADIOLOGY:   No results found for this or any previous visit. Results for orders placed during the hospital encounter of 10/28/16  DG Chest 2 View   Narrative CLINICAL DATA:  Off and chest congestion for the past 2 weeks with fever. History of COPD -asthma, current smoker, CHF.  EXAM: CHEST  2 VIEW  COMPARISON:  Chest x-ray of August 07, 2016  FINDINGS: The right lung is mildly hyperinflated. There is a tiny right pleural effusion. On the left there is a small pleural effusion. There is left basilar atelectasis or infiltrate. The heart and pulmonary vascularity are normal. There is calcification in the wall of the aortic arch. There is mild multilevel  degenerative disc disease of the thoracic spine.  IMPRESSION: Left lower lobe atelectasis or pneumonia with small left pleural effusion. Tiny right pleural effusion. Underlying COPD. No overt CHF. Followup PA and lateral chest X-ray is recommended in 3-4 weeks following trial of antibiotic therapy to ensure resolution and exclude underlying malignancy.  Thoracic aortic atherosclerosis.   Electronically Signed   By: David  Martinique M.D.   On: 10/28/2016 12:18    ------------------------------------------------------------------------------------------------------------------  Thank  you for allowing [facility] to assist in the care of your patient. Our recommendations are noted above.  Please contact us if we can be of further service.   [name], MD.  [facility] Office Number: [phone]  Patricia Pesa, M.D.  Merton Border, M.D  02/16/2017

## 2017-02-16 NOTE — Progress Notes (Signed)
[facility] day:  02/16/2017    Chief Complaint: [name] [name] is a 77 y.o. female with clinical stage IIIA squamous cell carcinoma of the left lower lobe who is seen after interval bronchoscopy.  HPI: The patient was last seen in the medical oncology clinic on 02/02/2017.  At that time, she was aching in all of her joints.  She had a continued cough with copious secretions.  She denied fever.  Exam revealed decreased breath sounds in the left lower lobe.  We discussed bronchoscopy and possible stent placement.   She underwent bronchoscopy by Dr. Ashby Dawes on 02/09/2017.  There was no evidence of endobronchial tumor.  There was stenosis at the entry to the left lower posterior segments which could not be entered.  There was mildly erythematous mucosa with copious secretions in the left lower lobe.  Brushings were positive for non-small cell carcinoma, favor squamous cell carcinoma.  Symptomatically, patient is "dragging along". Patient is doing some light laundry. She is able to complete all of her ADLs independently. Patient notes that she doesn't do a lot of cooking. She states, "I am eating a lot of [employer] soup". Patient's weigh is up 2 pounds since her last visit.   Patient has a productive (yellow sputum) cough. She experiences intermittent hemoptysis following her bronchoscopy. Patient complains of sweating episodes that alternate with episodes of chills. Patient has been on been on 2 different antibiotics (Levaquin 250 mg x 7 days and Fosfomycin 3 gram x 1 dose) for UTI and post-obstructive pneumonia.  Antibiotics completed > 1 week ago.  Patient is scheduled to follow up with Dr. Ashby Dawes this afternoon.   Patient complains of chronic generalized pain today rated 5/10.   Past Medical History:  Diagnosis Date  . Asthma   . Breast cancer ([place]) 2009   left  . Cancer ([place])   . CHF (congestive heart failure) ([place])   . CHF (congestive heart  failure) ([place])   . Chronic UTI   . COPD (chronic obstructive pulmonary disease) ([place])   . Dizziness   . Fibromyalgia   . Hypertension   . Neuropathy   . Personal history of tobacco use, presenting hazards to health 05/17/2015  . Polyp, larynx   . RA (rheumatoid arthritis) ([place])   . Sinus infection    recent  . Stumbling gait    to the left  . Supplemental oxygen dependent    2.5l    Past Surgical History:  Procedure Laterality Date  . ABDOMINAL HYSTERECTOMY    . BREAST LUMPECTOMY Left 2009   chemo and radiation  . CYST EXCISION Left 02/27/2015   Procedure: CYST REMOVAL;  Surgeon: Hessie Knows, MD;  Location: [facility] ORS;  Service: Orthopedics;  Laterality: Left;  . EYE MUSCLE SURGERY Right    13 surgeries  . FLEXIBLE BRONCHOSCOPY N/A 07/01/2016   Procedure: FLEXIBLE BRONCHOSCOPY;  Surgeon: Wilhelmina Mcardle, MD;  Location: [facility] ORS;  Service: Pulmonary;  Laterality: N/A;  . FLEXIBLE BRONCHOSCOPY N/A 02/09/2017   Procedure: FLEXIBLE BRONCHOSCOPY;  Surgeon: [name], MD;  Location: [facility] ORS;  Service: Pulmonary;  Laterality: N/A;  . THUMB ARTHROSCOPY Left     Family History  Problem Relation Age of Onset  . Diabetes Father   . Stroke Father   . Heart attack Father   . CAD Sister     Social History:  reports that she has been smoking cigarettes.  She has a 40.00 pack-year smoking history. she  has never used smokeless tobacco. She reports that she does not drink alcohol or use drugs.  He lives with her son and grandson. The patient is alone today.   Allergies:  Allergies  Allergen Reactions  . Contrast Media [Iodinated Diagnostic Agents] Other (See Comments)    Pt was sent to the ED following contrast media injection at [facility]. Unknown reason. She has been premedicated since without complications. Pt to be premedicated prior to contrast media injections  . Ioxaglate Other (See Comments)    Pt was sent to the ED following contrast media injection at  [facility]. Unknown reason. She has been premedicated since without complications. Pt to be premedicated prior to contrast media injections  . Sulfa Antibiotics Other (See Comments)    Unknown  . Tetracycline Hives  . White Petrolatum Other (See Comments)    Blisters  . Amoxicillin-Pot Clavulanate Rash and Other (See Comments)    Blisters in mouth Has patient had a PCN reaction causing immediate rash, facial/tongue/throat swelling, SOB or lightheadedness with hypotension: No Has patient had a PCN reaction causing severe rash involving mucus membranes or skin necrosis: No Has patient had a PCN reaction that required hospitalization: No Has patient had a PCN reaction occurring within the last 10 years: No If all of the above answers are "NO", then may proceed with Cephalosporin use.   . Tape Rash    Current Medications: Current Outpatient Medications  Medication Sig Dispense Refill  . Calcium-Vitamin D 600-200 MG-UNIT tablet Take 1 tablet by mouth daily.     . carvedilol (COREG) 12.5 MG tablet Take 1 tablet (12.5 mg total) by mouth 2 (two) times daily with a meal. 60 tablet 0  . cholecalciferol (VITAMIN D) 1000 units tablet Take 1,000 Units by mouth daily.    . clonazePAM (KLONOPIN) 1 MG tablet Take 1 mg by mouth 2 (two) times daily as needed for anxiety.     . gabapentin (NEURONTIN) 600 MG tablet Take 600 mg by mouth 3 (three) times daily.     [name] HYDROcodone-acetaminophen (NORCO/VICODIN) 5-325 MG tablet Take 1 tablet by mouth 2 (two) times daily.     [name] imipramine (TOFRANIL) 25 MG tablet Take 25 mg by mouth at bedtime.     . Multiple Vitamin (MULTIVITAMIN) capsule Take 1 capsule by mouth daily.    . nitroGLYCERIN (NITROSTAT) 0.4 MG SL tablet Place 0.4 mg under the tongue every 5 (five) minutes as needed for chest pain.     . potassium chloride (K-DUR) 10 MEQ tablet Take 1 tablet (10 mEq total) by mouth daily. 10 tablet 0  . sucralfate (CARAFATE) 1 g tablet Take 1 tablet (1 g total)  by mouth 3 (three) times daily. 90 tablet 6  . Tiotropium Bromide Monohydrate (SPIRIVA RESPIMAT) 2.5 MCG/ACT AERS Inhale 2.5 mcg into the lungs 2 (two) times daily. (Patient taking differently: Inhale 1 puff into the lungs 2 (two) times daily. ) 1 Inhaler 0  . traMADol (ULTRAM) 50 MG tablet Take 100 mg by mouth daily.     . vitamin C (ASCORBIC ACID) 500 MG tablet Take 500 mg by mouth daily.    [name] ZINC SULFATE PO Take 1 tablet by mouth daily.     [name] zolpidem (AMBIEN) 5 MG tablet Take 5 mg by mouth at bedtime.     No current facility-administered medications for this visit.     Review of Systems:  GENERAL:  "Dragging along".  Chills.  Sweats.  No fevers. Weight up 2  pounds. PERFORMANCE STATUS (ECOG):  2 HEENT:  No visual changes, runny nose, sore throat, mouth sores or tenderness. Lungs: Shortness of breath, stable.  Cough productive of copious thick secretions.  Scant hemoptysis after bronchoscopy.  On oxygen 2 liter/min via [place]. Cardiac:  No chest pain, palpitations, orthopnea, or PND. GI:   No nausea, vomiting, diarrhea, constipation, melena or hematochezia. GU:  No urgency, dysuria, or hematuria.  Musculoskeletal:  Fibromyalgia.  Osteoporosis.  Joints ache.   No muscle tenderness. Extremities:  No pain or swelling. Skin:  No rashes or skin changes. Neuro:  No headache, numbness or weakness, balance or coordination issues. Endocrine:  No diabetes, thyroid issues, hot flashes or night sweats. Psych:  No mood changes, depression or anxiety. Pain:  Generalized pain- 5/10; "whole body aches"  Review of systems:  All other systems reviewed and found to be negative.  Physical Exam: Blood pressure 132/76, pulse 86, temperature 98.2 F (36.8 C), temperature source Tympanic, resp. rate 16, weight 112 lb 11.2 oz (51.1 kg). O2 sats 95%. GENERAL:  Thin woman sitting comfortably in the exam room in no acute distress. MENTAL STATUS:  Alert and oriented to person, place and time. HEAD:  Short thin  blonde hair.  Normocephalic, atraumatic, face symmetric, no Cushingoid features. EYES:  Blue eyes.  Pupils equal round and reactive to light and accomodation.  No conjunctivitis or scleral icterus. ENT:  Oropharynx clear without lesion.  Tongue normal. Mucous membranes moist.  RESPIRATORY:  Decreased respiratory excursion.  Decreased breath sounds LLL.  No rales, wheezes or rhonchi. CARDIOVASCULAR:  Regular rate and rhythm without murmur, rub or gallop. ABDOMEN:  Soft, non tender with active bowel sounds and no hepatosplenomegaly.  No masses. BACK:  Posterior iliac crest pain on palpation.  No CVA tenderness. SKIN:  No rashes, ulcers or lesions. EXTREMITIES: Arthritic changes in hands. Ankle edema.  No skin discoloration or tenderness.  No palpable cords. LYMPH NODES: No palpable cervical, supraclavicular, axillary or inguinal adenopathy  NEUROLOGICAL: Unremarkable. PSYCH:  Appropriate.   Appointment on 02/16/2017  Component Date Value Ref Range Status  . Sodium 02/16/2017 131* 135 - 145 mmol/L Final  . Potassium 02/16/2017 4.3  3.5 - 5.1 mmol/L Final  . Chloride 02/16/2017 93* 101 - 111 mmol/L Final  . CO2 02/16/2017 26  22 - 32 mmol/L Final  . Glucose, Bld 02/16/2017 101* 65 - 99 mg/dL Final  . BUN 02/16/2017 15  6 - 20 mg/dL Final  . Creatinine, Ser 02/16/2017 1.18* 0.44 - 1.00 mg/dL Final  . Calcium 02/16/2017 9.3  8.9 - 10.3 mg/dL Final  . Total Protein 02/16/2017 7.8  6.5 - 8.1 g/dL Final  . Albumin 02/16/2017 3.9  3.5 - 5.0 g/dL Final  . AST 02/16/2017 21  15 - 41 U/L Final  . ALT 02/16/2017 10* 14 - 54 U/L Final  . Alkaline Phosphatase 02/16/2017 128* 38 - 126 U/L Final  . Total Bilirubin 02/16/2017 0.5  0.3 - 1.2 mg/dL Final  . GFR calc non Af Amer 02/16/2017 44* >60 mL/min Final  . GFR calc Af Amer 02/16/2017 51* >60 mL/min Final   Comment: (NOTE) The eGFR has been calculated using the CKD EPI equation. This calculation has not been validated in all clinical  situations. eGFR's persistently <60 mL/min signify possible Chronic Kidney Disease.   Georgiann Hahn gap 02/16/2017 12  5 - 15 Final   Performed at [facility], [address] [place], [place] [address]  . WBC 02/16/2017 9.8  3.6 -  11.0 K/uL Final  . RBC 02/16/2017 4.07  3.80 - 5.20 MIL/uL Final  . Hemoglobin 02/16/2017 12.4  12.0 - 16.0 g/dL Final  . HCT 02/16/2017 36.9  35.0 - 47.0 % Final  . MCV 02/16/2017 90.5  80.0 - 100.0 fL Final  . MCH 02/16/2017 30.4  26.0 - 34.0 pg Final  . MCHC 02/16/2017 33.6  32.0 - 36.0 g/dL Final  . RDW 02/16/2017 15.9* 11.5 - 14.5 % Final  . Platelets 02/16/2017 204  150 - 440 K/uL Final  . Neutrophils Relative % 02/16/2017 78  % Final  . Neutro Abs 02/16/2017 7.7* 1.4 - 6.5 K/uL Final  . Lymphocytes Relative 02/16/2017 11  % Final  . Lymphs Abs 02/16/2017 1.0  1.0 - 3.6 K/uL Final  . Monocytes Relative 02/16/2017 7  % Final  . Monocytes Absolute 02/16/2017 0.7  0.2 - 0.9 K/uL Final  . Eosinophils Relative 02/16/2017 3  % Final  . Eosinophils Absolute 02/16/2017 0.3  0 - 0.7 K/uL Final  . Basophils Relative 02/16/2017 1  % Final  . Basophils Absolute 02/16/2017 0.1  0 - 0.1 K/uL Final   Performed at [facility], [address], [place], [place] [address]  . Magnesium 02/16/2017 2.0  1.7 - 2.4 mg/dL Final   Performed at [facility], [address] [place], [place] [address]    Assessment:  [name] [name] is a 77 y.o. female with a clinical T2bNxM0 squamous cell lung cancer of the left lung s/p bronchoscopy and biopsy on 07/01/2016.  She has a 40 pack year smoking history.  She presented with left lower chest wall pain.  Chest CT with contrast on 06/11/2016 revealed a 3.2 x 3.0 mass like area of focal opacity in the medial left lower lobe with obliteration of segmental airways to the anterior left lower lobe.  PET scan on 06/27/2016 revealed a 4.3 x 3.2 cm central left lower lobe lung lesion (SUV 14.3) consistent with primary  bronchogenic carcinoma.  There was equivocal nodal tissue in the subcarinal station demonstrating mild hypermetabolism (SUV 3.6). There was more peripheral left lower lobe increased atelectasis with mucoid impaction which is likely secondary to endobronchial obstruction.  There was a small left pleural effusion.  Head MRI on 07/07/2016 revealed no evidence of metastatic disease.  She received radiation from 07/22/2016 - 09/19/2016.  She received 5 weeks of concurrent carboplatin and Taxol (07/24/2016 - 08/11/2016; 09/05/2016 - 09/16/2016). She requires a reduced dose of Benadryl (25 mg) for her premedication.  Week #3 was postponed secondary to chest pain and evaluation.  She missed a couple of weeks.  Chest CT on 11/21/2016 revealed progressive collapse of the LEFT lower lobe presumably related to central obstructing mass.  Mass obstructed the LEFT lower lobe bronchus, although mass was not well demonstrated.  There were small effusions (no change).  There was centrilobular emphysema unchanged.  There was no evidence pneumonia.  PET scan on 01/27/2017 revealed interval response to therapy.  The previously noted hypermetabolism associated with left lower lobe perihilar lung mass has resolved in the interval.  There was a moderate left pleural effusion that had increased in volume from previous PET-CT and there was now complete atelectasis/consolidation of the left lower lobe, which may obscure residual mass.  There was no new sites of hypermetabolism or evidence of distant metastatic disease.  Bronchoscopy on 02/09/2017 revealed no evidence of endobronchial tumor.  There was stenosis at the entry to the left lower posterior segments which  could not be entered.  There was mildly erythematous mucosa with copious secretions in the left lower lobe.  Brushings were positive for non-small cell carcinoma, favor squamous cell carcinoma.  She has a normocytic anemia likely due to chemotherapy.  Work-up on  07/24/2016 revealed the following normal labs: ferritin (103), B12 (345), folate(19.7).  Iron saturation was 8% and TIBC was 258.  She has a history of stage IA left breast cancer in 02/2005.  She underwent lumpectomy.  Pathology revealed a T1cN0 lesion.  Tumor was ER + , PR +, and Her2/neu -.  Two sentinel lymph nodes were negative.  She received chemotherapy (4 cycles of AC and possibly an abbreviated course of Taxol- no records available).  She received radiation.  She completed Femara in 08/2010.  She has fibromyalgia.  She has advanced thoracoabdominal aortic atherosclerosis with fusiform dilatation of the descending thoracic aorta up to 4.3 cm.  She is followed by Dr Lucky Cowboy.  Symptomatically, she has posterior iliac crest pain.  She has continued cough and copious secretions.  She has chills and sweats, but denies fever. Alkaline phosphatase is slightly elevated.    Plan: 1.  Discuss interval bronchoscopy and persistent squamous cell carcinoma resulting in obstruction. 2.  Send tissue for Foundation One (if sufficient) or PDL-1. 3.  CXR (PA and lateral)- assess left sided pleural effusion. 4.  Discuss need for bone scan. Alkaline phosphatase elevated and patient is tender to touch worrisome for bone metastasis.  5.  Discuss treatment options using multi-agent therapy for progressive, possible metastatic disease. Discuss carboplatin + Abraxane + pembrolizumab. 6.  Schedule thoracentesis.  Fluid for cell count and diff, glucose, protein, culture, and cytology. 7.  Anticipate additional course of antibiotics.  Patient sees Dr. Ashby Dawes today. 8.  RTC after bone scan and thoracentesis for MD assessment, review of testing, and discussion regarding direction of therapy.   Honor Loh, NP  02/16/2017, 10:43 AM   I saw and evaluated the patient, participating in the key portions of the service and reviewing pertinent diagnostic studies and records.  I reviewed the nurse practitioner's note and  agree with the findings and the plan.  The assessment and plan were discussed with the patient.  Additional diagnostic studies of a thoracentesis and bone scan are needed to clarify stage of lung cancer and would change the clinical management.  Multiple questions were asked by the patient and answered.   Nolon Stalls, MD 02/16/2017,10:43 AM

## 2017-02-16 NOTE — Patient Instructions (Addendum)
Start Mucinex-D once daily.  Start azithromycin 250 mg daily.  --Will prescribe rescue inhaler to used, 2 puff as needed for trouble breathing.   --Quitting smoking is the most important thing that you can do for your health.  --Quitting smoking will have greater affect on your health than any medicine that we can give you.

## 2017-02-17 ENCOUNTER — Ambulatory Visit
Admission: RE | Admit: 2017-02-17 | Discharge: 2017-02-17 | Disposition: A | Discharge status: Home / Self Care | Payer: Medicare HMO | Source: Ambulatory Visit | Attending: Urgent Care | Admitting: Urgent Care

## 2017-02-17 ENCOUNTER — Ambulatory Visit
Admission: RE | Admit: 2017-02-17 | Discharge: 2017-02-17 | Disposition: A | Discharge status: Home / Self Care | Payer: Medicare HMO | Source: Ambulatory Visit | Attending: Student | Admitting: Student

## 2017-02-17 DIAGNOSIS — J9811 Atelectasis: Secondary | ICD-10-CM | POA: Insufficient documentation

## 2017-02-17 DIAGNOSIS — J90 Pleural effusion, not elsewhere classified: Secondary | ICD-10-CM | POA: Diagnosis not present

## 2017-02-17 DIAGNOSIS — I700 Atherosclerosis of aorta: Secondary | ICD-10-CM | POA: Diagnosis not present

## 2017-02-17 DIAGNOSIS — C3432 Malignant neoplasm of lower lobe, left bronchus or lung: Secondary | ICD-10-CM | POA: Diagnosis not present

## 2017-02-17 DIAGNOSIS — Z9889 Other specified postprocedural states: Secondary | ICD-10-CM

## 2017-02-17 LAB — PROTEIN, PLEURAL OR PERITONEAL FLUID: Total protein, fluid: 3.9 g/dL

## 2017-02-17 LAB — BODY FLUID CELL COUNT WITH DIFFERENTIAL
EOS FL: 5 %
Lymphs, Fluid: 67 %
Monocyte-Macrophage-Serous Fluid: 21 %
Neutrophil Count, Fluid: 7 %
Other Cells, Fluid: 0 %
Total Nucleated Cell Count, Fluid: 2438 cu mm

## 2017-02-17 LAB — GLUCOSE, PLEURAL OR PERITONEAL FLUID: Glucose, Fluid: 100 mg/dL

## 2017-02-17 LAB — ACID FAST SMEAR (AFB, MYCOBACTERIA): Acid Fast Smear: NEGATIVE

## 2017-02-17 LAB — ACID FAST SMEAR (AFB)

## 2017-02-17 NOTE — Procedures (Addendum)
PROCEDURE SUMMARY:  Successful US guided diagnostic and therapeutic left thoracentesis. Yielded 250 mL of cloudy, yellow fluid. Pt tolerated procedure well. No immediate complications.  Specimen was sent for labs. CXR ordered.  Docia Barrier PA-C 02/17/2017 10:01 AM

## 2017-02-18 LAB — CYTOLOGY - NON PAP

## 2017-02-20 ENCOUNTER — Telehealth: Payer: Self-pay | Admitting: Internal Medicine

## 2017-02-20 LAB — BODY FLUID CULTURE: Culture: NO GROWTH

## 2017-02-20 NOTE — Telephone Encounter (Signed)
Pt calling stating Monday she had fluid drained from her lungs and that we was advised not to shower until she heard from our office She's been only able to do a bird bath she states but is wanting a call back to make sure when she is able to fully shower   Please call back

## 2017-02-23 NOTE — Telephone Encounter (Signed)
Yes, ok to shower, replace any dressings on the wound with a new bandaid daily until the skin is closed.

## 2017-02-23 NOTE — Telephone Encounter (Signed)
Detailed message left as stated below. Nothing further needed.

## 2017-02-25 ENCOUNTER — Ambulatory Visit: Payer: Medicare HMO | Admitting: Radiation Oncology

## 2017-02-27 ENCOUNTER — Encounter
Admission: RE | Admit: 2017-02-27 | Discharge: 2017-02-27 | Disposition: A | Discharge status: Home / Self Care | Payer: Medicare HMO | Source: Ambulatory Visit | Attending: Urgent Care | Admitting: Urgent Care

## 2017-02-27 ENCOUNTER — Other Ambulatory Visit: Payer: Medicare HMO

## 2017-02-27 DIAGNOSIS — C3432 Malignant neoplasm of lower lobe, left bronchus or lung: Secondary | ICD-10-CM | POA: Insufficient documentation

## 2017-02-27 DIAGNOSIS — M898X8 Other specified disorders of bone, other site: Secondary | ICD-10-CM | POA: Insufficient documentation

## 2017-02-27 MED ORDER — TECHNETIUM TC 99M MEDRONATE IV KIT
25.0000 | PACK | Freq: Once | INTRAVENOUS | Status: AC | PRN
  Administered 2017-02-27: 23.46 via INTRAVENOUS

## 2017-03-02 DIAGNOSIS — C3432 Malignant neoplasm of lower lobe, left bronchus or lung: Secondary | ICD-10-CM | POA: Diagnosis not present

## 2017-03-03 ENCOUNTER — Other Ambulatory Visit (INDEPENDENT_AMBULATORY_CARE_PROVIDER_SITE_OTHER): Payer: Self-pay | Admitting: Vascular Surgery

## 2017-03-03 ENCOUNTER — Encounter: Payer: Self-pay | Admitting: Hematology and Oncology

## 2017-03-03 ENCOUNTER — Inpatient Hospital Stay (HOSPITAL_BASED_OUTPATIENT_CLINIC_OR_DEPARTMENT_OTHER): Payer: Medicare HMO | Admitting: Hematology and Oncology

## 2017-03-03 VITALS — BP 140/81 | HR 89 | Temp 97.1°F | Resp 20 | Wt 114.0 lb

## 2017-03-03 DIAGNOSIS — Z853 Personal history of malignant neoplasm of breast: Secondary | ICD-10-CM

## 2017-03-03 DIAGNOSIS — Z923 Personal history of irradiation: Secondary | ICD-10-CM

## 2017-03-03 DIAGNOSIS — I509 Heart failure, unspecified: Secondary | ICD-10-CM

## 2017-03-03 DIAGNOSIS — M899 Disorder of bone, unspecified: Secondary | ICD-10-CM | POA: Insufficient documentation

## 2017-03-03 DIAGNOSIS — Z880 Allergy status to penicillin: Secondary | ICD-10-CM

## 2017-03-03 DIAGNOSIS — R61 Generalized hyperhidrosis: Secondary | ICD-10-CM | POA: Diagnosis not present

## 2017-03-03 DIAGNOSIS — R6883 Chills (without fever): Secondary | ICD-10-CM | POA: Diagnosis not present

## 2017-03-03 DIAGNOSIS — F17210 Nicotine dependence, cigarettes, uncomplicated: Secondary | ICD-10-CM

## 2017-03-03 DIAGNOSIS — J90 Pleural effusion, not elsewhere classified: Secondary | ICD-10-CM

## 2017-03-03 DIAGNOSIS — M797 Fibromyalgia: Secondary | ICD-10-CM | POA: Diagnosis not present

## 2017-03-03 DIAGNOSIS — J449 Chronic obstructive pulmonary disease, unspecified: Secondary | ICD-10-CM

## 2017-03-03 DIAGNOSIS — M069 Rheumatoid arthritis, unspecified: Secondary | ICD-10-CM

## 2017-03-03 DIAGNOSIS — I110 Hypertensive heart disease with heart failure: Secondary | ICD-10-CM | POA: Diagnosis not present

## 2017-03-03 DIAGNOSIS — R5383 Other fatigue: Secondary | ICD-10-CM

## 2017-03-03 DIAGNOSIS — R042 Hemoptysis: Secondary | ICD-10-CM | POA: Diagnosis not present

## 2017-03-03 DIAGNOSIS — Z9981 Dependence on supplemental oxygen: Secondary | ICD-10-CM

## 2017-03-03 DIAGNOSIS — Z170 Estrogen receptor positive status [ER+]: Secondary | ICD-10-CM

## 2017-03-03 DIAGNOSIS — R7989 Other specified abnormal findings of blood chemistry: Secondary | ICD-10-CM

## 2017-03-03 DIAGNOSIS — C3432 Malignant neoplasm of lower lobe, left bronchus or lung: Secondary | ICD-10-CM

## 2017-03-03 DIAGNOSIS — Z882 Allergy status to sulfonamides status: Secondary | ICD-10-CM

## 2017-03-03 DIAGNOSIS — J9811 Atelectasis: Secondary | ICD-10-CM

## 2017-03-03 DIAGNOSIS — D649 Anemia, unspecified: Secondary | ICD-10-CM

## 2017-03-03 DIAGNOSIS — Z79899 Other long term (current) drug therapy: Secondary | ICD-10-CM

## 2017-03-03 DIAGNOSIS — Z9223 Personal history of estrogen therapy: Secondary | ICD-10-CM

## 2017-03-03 DIAGNOSIS — Z9221 Personal history of antineoplastic chemotherapy: Secondary | ICD-10-CM

## 2017-03-03 LAB — CULTURE, FUNGUS WITHOUT SMEAR

## 2017-03-03 MED ORDER — CLINDAMYCIN PHOSPHATE 300 MG/50ML IV SOLN
300.0000 mg | Freq: Once | INTRAVENOUS | Status: AC
  Administered 2017-03-04: 300 mg via INTRAVENOUS

## 2017-03-03 NOTE — Progress Notes (Signed)
[facility] day:  03/03/2017    Chief Complaint: [name] [name] is a 77 y.o. female with clinical stage IIIA squamous cell carcinoma of the left lower lobe who is seen after interval thoracentesis and bone scan.  HPI: The patient was last seen in the medical oncology clinic on 02/16/2017.  At that time, she had posterior iliac crest pain.  She had continued cough and copious secretions.  She had chills and sweats, but denied fever.  Alkaline phosphatase was slightly elevated.  Interval bronchoscopy had revealed persistent squamous cell carcinoma.  Ultrasound guided thoracentesis on 02/17/2017 revealed 250 ml of yellow fluid.  Fluid was negative for malignancy.  There were lymphocytes, macrophages, and reactive mesothelial cells.  Culture was negative.  Bone scan on 02/27/2017 revealed a subtle focus of uptake in the left 7th rib, indeterminate.  Review of CT data from the PET scan of 1 month ago revealed no left-sided rib lesion. There were stable degenerative changes in the lumbar spine.  Symptomatically, she notes feeling bad after the thoracentesis.  She has been sleeping in a chair (long standing).  She notes pain in the right side.  She denies any rib trauma.   Past Medical History:  Diagnosis Date  . Asthma   . Breast cancer ([place]) 2009   left  . Cancer ([place])   . CHF (congestive heart failure) ([place])   . CHF (congestive heart failure) ([place])   . Chronic UTI   . COPD (chronic obstructive pulmonary disease) ([place])   . Dizziness   . Fibromyalgia   . Hypertension   . Neuropathy   . Personal history of tobacco use, presenting hazards to health 05/17/2015  . Polyp, larynx   . RA (rheumatoid arthritis) ([place])   . Sinus infection    recent  . Stumbling gait    to the left  . Supplemental oxygen dependent    2.5l    Past Surgical History:  Procedure Laterality Date  . ABDOMINAL HYSTERECTOMY    . BREAST LUMPECTOMY Left 2009   chemo and  radiation  . CYST EXCISION Left 02/27/2015   Procedure: CYST REMOVAL;  Surgeon: Hessie Knows, MD;  Location: [facility] ORS;  Service: Orthopedics;  Laterality: Left;  . EYE MUSCLE SURGERY Right    13 surgeries  . FLEXIBLE BRONCHOSCOPY N/A 07/01/2016   Procedure: FLEXIBLE BRONCHOSCOPY;  Surgeon: Wilhelmina Mcardle, MD;  Location: [facility] ORS;  Service: Pulmonary;  Laterality: N/A;  . FLEXIBLE BRONCHOSCOPY N/A 02/09/2017   Procedure: FLEXIBLE BRONCHOSCOPY;  Surgeon: [name], MD;  Location: [facility] ORS;  Service: Pulmonary;  Laterality: N/A;  . THUMB ARTHROSCOPY Left     Family History  Problem Relation Age of Onset  . Diabetes Father   . Stroke Father   . Heart attack Father   . CAD Sister     Social History:  reports that she has been smoking cigarettes.  She has a 40.00 pack-year smoking history. she has never used smokeless tobacco. She reports that she does not drink alcohol or use drugs.  He lives with her son and grandson. The patient is alone today.   Allergies:  Allergies  Allergen Reactions  . Contrast Media [Iodinated Diagnostic Agents] Other (See Comments)    Pt was sent to the ED following contrast media injection at [facility]. Unknown reason. She has been premedicated since without complications. Pt to be premedicated prior to contrast media injections  . Ioxaglate Other (See Comments)  Pt was sent to the ED following contrast media injection at [facility]. Unknown reason. She has been premedicated since without complications. Pt to be premedicated prior to contrast media injections  . Sulfa Antibiotics Other (See Comments)    Unknown  . Tetracycline Hives  . White Petrolatum Other (See Comments)    Blisters  . Amoxicillin-Pot Clavulanate Rash and Other (See Comments)    Blisters in mouth Has patient had a PCN reaction causing immediate rash, facial/tongue/throat swelling, SOB or lightheadedness with hypotension: No Has patient had a PCN reaction  causing severe rash involving mucus membranes or skin necrosis: No Has patient had a PCN reaction that required hospitalization: No Has patient had a PCN reaction occurring within the last 10 years: No If all of the above answers are "NO", then may proceed with Cephalosporin use.   . Tape Rash    Current Medications: Current Outpatient Medications  Medication Sig Dispense Refill  . albuterol (PROVENTIL HFA;VENTOLIN HFA) 108 (90 Base) MCG/ACT inhaler Inhale 1-2 puffs into the lungs every 6 (six) hours as needed for wheezing or shortness of breath. 1 Inhaler 2  . azithromycin (ZITHROMAX) 250 MG tablet Take 1 tablet (250 mg total) by mouth daily. Take once daily. 30 tablet 0  . Calcium-Vitamin D 600-200 MG-UNIT tablet Take 1 tablet by mouth daily.     . carvedilol (COREG) 12.5 MG tablet Take 1 tablet (12.5 mg total) by mouth 2 (two) times daily with a meal. (Patient not taking: Reported on 02/16/2017) 60 tablet 0  . cholecalciferol (VITAMIN D) 1000 units tablet Take 1,000 Units by mouth daily.    . clonazePAM (KLONOPIN) 1 MG tablet Take 1 mg by mouth 2 (two) times daily as needed for anxiety.     . gabapentin (NEURONTIN) 600 MG tablet Take 600 mg by mouth 3 (three) times daily.     [name] HYDROcodone-acetaminophen (NORCO/VICODIN) 5-325 MG tablet Take 1 tablet by mouth 2 (two) times daily.     [name] imipramine (TOFRANIL) 25 MG tablet Take 25 mg by mouth at bedtime.     . Multiple Vitamin (MULTIVITAMIN) capsule Take 1 capsule by mouth daily.    . nitroGLYCERIN (NITROSTAT) 0.4 MG SL tablet Place 0.4 mg under the tongue every 5 (five) minutes as needed for chest pain.     . potassium chloride (K-DUR) 10 MEQ tablet Take 1 tablet (10 mEq total) by mouth daily. (Patient not taking: Reported on 02/16/2017) 10 tablet 0  . sucralfate (CARAFATE) 1 g tablet Take 1 tablet (1 g total) by mouth 3 (three) times daily. (Patient not taking: Reported on 02/16/2017) 90 tablet 6  . Tiotropium Bromide Monohydrate (SPIRIVA RESPIMAT)  2.5 MCG/ACT AERS Inhale 2.5 mcg into the lungs 2 (two) times daily. (Patient not taking: Reported on 02/16/2017) 1 Inhaler 0  . traMADol (ULTRAM) 50 MG tablet Take 100 mg by mouth daily.     . vitamin C (ASCORBIC ACID) 500 MG tablet Take 500 mg by mouth daily.    [name] ZINC SULFATE PO Take 1 tablet by mouth daily.     [name] zolpidem (AMBIEN) 5 MG tablet Take 5 mg by mouth at bedtime.     No current facility-administered medications for this visit.     Review of Systems:  GENERAL:  Fatigue.  No fever or chills.  Some sweats.  No fevers. Weight up 1 pound. PERFORMANCE STATUS (ECOG):  2 HEENT:  No visual changes, runny nose, sore throat, mouth sores or tenderness. Lungs: Shortness of breath.  Cough.  Scant hemoptysis after bronchoscopy, resolved.  On oxygen 2 liter/min via [place]. Cardiac:  No chest pain, palpitations, orthopnea, or PND. GI:   No nausea, vomiting, diarrhea, constipation, melena or hematochezia. GU:  No urgency, dysuria, or hematuria.  Musculoskeletal:  Fibromyalgia.  Right sided pain.  Osteoporosis.  Joints ache.   No muscle tenderness. Extremities:  No pain or swelling. Skin:  No rashes or skin changes. Neuro:  No headache, numbness or weakness, balance or coordination issues. Endocrine:  No diabetes, thyroid issues, hot flashes or night sweats. Psych:  No mood changes, depression or anxiety. Pain:  No pain today. Review of systems:  All other systems reviewed and found to be negative.  Physical Exam: There were no vitals taken for this visit. O2 sats 95%. GENERAL:  Thin woman sitting comfortably in the exam room in no acute distress. MENTAL STATUS:  Alert and oriented to person, place and time. HEAD:  Short gray blonde hair.  Normocephalic, atraumatic, face symmetric, no Cushingoid features. EYES:  Blue eyes.  Pupils equal round and reactive to light and accomodation.  No conjunctivitis or scleral icterus. ENT:  Oropharynx clear without lesion.  Tongue normal.  Mucous membranes  moist.  RESPIRATORY:  Decreased respiratory excursion.  Decreased breath left base.  No rales, wheezes or rhonchi. CARDIOVASCULAR:  Regular rate and rhythm without murmur, rub or gallop. ABDOMEN:  Soft, non tender with active bowel sounds and no hepatosplenomegaly.  No masses. BACK:  Right sided posterior iliac crest/sacral pain.  SKIN:  No rashes, ulcers or lesions. EXTREMITIES: Arthritic changes in hands. Ankle edema.  No skin discoloration or tenderness.  No palpable cords. LYMPH NODES: No palpable cervical, supraclavicular, axillary or inguinal adenopathy  NEUROLOGICAL: Unremarkable. PSYCH:  Appropriate.   No visits with results within 3 Day(s) from this visit.  Latest known visit with results is:  Hospital Outpatient Visit on 02/17/2017  Component Date Value Ref Range Status  . CYTOLOGY - NON GYN 02/17/2017    Final                   Value:Cytology - Non PAP CASE: [id number] PATIENT: [name] [name] Non-Gyn Cytology Report     SPECIMEN SUBMITTED: A. Pleural fluid, left  CLINICAL HISTORY: Squamous cell carcinoma LLL  PRE-OPERATIVE DIAGNOSIS: Left pleural effusion  POST-OPERATIVE DIAGNOSIS: None provided.     DIAGNOSIS: A. PLEURAL FLUID, LEFT; ULTRASOUND-GUIDED THORACENTESIS: - NEGATIVE FOR MALIGNANCY. - LYMPHOCYTES, MACROPHAGES, AND REACTIVE MESOTHELIAL CELLS.  Comment: Slides reviewed: 2 cytospin slide, 1 ThinPrep slide, and 1 cell block    GROSS DESCRIPTION: A. Specimen Labeled: left pleural fluid Volume: 250 mL Description: translucent yellow fluid in a glass 1000 mL evacuated container Cell block(s): 1 and 1 ThinPrep  Final Diagnosis performed by Bryan Lemma, MD.  Electronically signed 02/18/2017 4:57:04PM    The electronic signature indicates that the named Attending Pathologist has evaluated the specimen  Technical component performed at [facility] [address] [place]                         on, [place] [address] Lab: [id number] Dir: Rush Farmer, MD, MMM  Professional component performed at [facility], [facility], [place], [place], [place] [address] Lab: [phone] Dir: Dellia Nims. Rubinas, MD    . Fluid Type-FCT 02/17/2017 PLEURAL   Corrected   CORRECTED ON 02/05 AT 1019: PREVIOUSLY REPORTED AS CYTOPLEU  . Color, Fluid 02/17/2017 STRAW* YELLOW Final  . Appearance, Fluid 02/17/2017  HAZY* CLEAR Final  . WBC, Fluid 02/17/2017 2,438  cu mm Final  . Neutrophil Count, Fluid 02/17/2017 7  % Final  . Lymphs, Fluid 02/17/2017 67  % Final  . Monocyte-Macrophage-Serous Fluid 02/17/2017 21  % Final  . Eos, Fluid 02/17/2017 5  % Final  . Other Cells, Fluid 02/17/2017 0  % Final   Performed at [facility], [address] [place], [place] [address]  . Specimen Description 02/17/2017    Final                   Value:PLEURAL Performed at [facility], [address] [place], [place] [address]   . Special Requests 02/17/2017    Final                   Value:PLEURAL Performed at [facility], [address] [place], [place] [address]   . Gram Stain 02/17/2017    Final                   Value:FEW WBC PRESENT,BOTH PMN AND MONONUCLEAR NO ORGANISMS SEEN   . Culture 02/17/2017    Final                   Value:NO GROWTH 3 DAYS Performed at [facility], [place] [address], [place], [place] [address]   . Report Status 02/17/2017 02/20/2017 FINAL   Final  . Total protein, fluid 02/17/2017 3.9  g/dL Final   Comment: (NOTE) No normal range established for this test Results should be evaluated in conjunction with serum values   . Fluid Type-FTP 02/17/2017 PLEURAL   Corrected   Comment: Performed at [facility], [address] [place], [place] [address] CORRECTED ON 02/05 AT 1019: PREVIOUSLY REPORTED AS CYTOPLEU   . Glucose, Fluid 02/17/2017 100  mg/dL Final   Comment: (NOTE) No normal range established for this test Results should be evaluated in conjunction  with serum values   . Fluid Type-FGLU 02/17/2017 PLEURAL   Corrected   Comment: Performed at [facility], [address] [place], [place] [address] CORRECTED ON 02/05 AT 1019: PREVIOUSLY REPORTED AS CYTOPLEU     Assessment:  [name] [name] is a 77 y.o. female with a clinical T2bNxM0 squamous cell lung cancer of the left lung s/p bronchoscopy and biopsy on 07/01/2016.  She has a 40 pack year smoking history.  She presented with left lower chest wall pain.  Chest CT with contrast on 06/11/2016 revealed a 3.2 x 3.0 mass like area of focal opacity in the medial left lower lobe with obliteration of segmental airways to the anterior left lower lobe.  PET scan on 06/27/2016 revealed a 4.3 x 3.2 cm central left lower lobe lung lesion (SUV 14.3) consistent with primary bronchogenic carcinoma.  There was equivocal nodal tissue in the subcarinal station demonstrating mild hypermetabolism (SUV 3.6). There was more peripheral left lower lobe increased atelectasis with mucoid impaction which is likely secondary to endobronchial obstruction.  There was a small left pleural effusion.  Head MRI on 07/07/2016 revealed no evidence of metastatic disease.  She received radiation from 07/22/2016 - 09/19/2016.  She received 5 weeks of concurrent carboplatin and Taxol (07/24/2016 - 08/11/2016; 09/05/2016 - 09/16/2016). She requires a reduced dose of Benadryl (25 mg) for her premedication.  Week #3 was postponed secondary to chest pain and evaluation.  She missed a couple of weeks.  Chest CT on 11/21/2016 revealed progressive collapse  of the LEFT lower lobe presumably related to central obstructing mass.  Mass obstructed the LEFT lower lobe bronchus, although mass was not well demonstrated.  There were small effusions (no change).  There was centrilobular emphysema unchanged.  There was no evidence pneumonia.  PET scan on 01/27/2017 revealed interval response to therapy.  The previously noted hypermetabolism  associated with left lower lobe perihilar lung mass has resolved in the interval.  There was a moderate left pleural effusion that had increased in volume from previous PET-CT and there was now complete atelectasis/consolidation of the left lower lobe, which may obscure residual mass.  There was no new sites of hypermetabolism or evidence of distant metastatic disease.  Bronchoscopy on 02/09/2017 revealed no evidence of endobronchial tumor.  There was stenosis at the entry to the left lower posterior segments which could not be entered.  There was mildly erythematous mucosa with copious secretions in the left lower lobe.  Brushings were positive for non-small cell carcinoma, favor squamous cell carcinoma.  Ultrasound guided thoracentesis on 02/17/2017 revealed was negative for malignancy.  Bone scan on 02/27/2017 revealed a subtle focus of uptake in the left 7th rib, indeterminate.  She has a normocytic anemia likely due to chemotherapy.  Work-up on 07/24/2016 revealed the following normal labs: ferritin (103), B12 (345), folate(19.7).  Iron saturation was 8% and TIBC was 258.  She has a history of stage IA left breast cancer in 02/2005.  She underwent lumpectomy.  Pathology revealed a T1cN0 lesion.  Tumor was ER + , PR +, and Her2/neu -.  Two sentinel lymph nodes were negative.  She received chemotherapy (4 cycles of AC and possibly an abbreviated course of Taxol- no records available).  She received radiation.  She completed Femara in 08/2010.  She has fibromyalgia.  She has advanced thoracoabdominal aortic atherosclerosis with fusiform dilatation of the descending thoracic aorta up to 4.3 cm.  She is followed by Dr Lucky Cowboy.  Symptomatically, she has posterior iliac crest/sacral pain.  She has a chronic cough.  Alkaline phosphatase is slightly elevated.    Plan: 1.  Discuss results of thoracentesis- no malignancy.  Possible sampling error.  Culture negative.  Discuss repeat thoracentesis if recurs and  patient symptomatic. 2.  Discuss results of bone scan- tiny focus of uptake left 7th rib. Unclear significance.  Site may be a focus of metastatic disease. 3.  Discuss results of bronchoscopy- positive for malignancy (squamous cell lung cancer).  Active disease present with resultant collapse of left lower lobe.  Discuss plan for initiation of treatment.  Discuss carboplatin + Abraxane + pembrolizumab.  Follow-up pending PDL-1/Foundation One testing. 4.  Discuss port placement with Dr. Lucky Cowboy. 5.  RTC 03/12/2017 for MD assessment, labs (CBC with diff, CMP, Mg), and cycle #1 carboplatin, Abraxane, and pembrolizumab.  Addendum:  Foundation One results will be available on 03/17/2017.  Will postpone initiation of chemotherapy x 1 week.   Lequita Asal, MD  03/03/2017, 5:35 PM

## 2017-03-03 NOTE — Progress Notes (Signed)
Patient offers no complaints today. 

## 2017-03-03 NOTE — Patient Instructions (Signed)
Pembrolizumab injection What is this medicine? PEMBROLIZUMAB (pem broe liz ue mab) is a monoclonal antibody. It is used to treat melanoma, head and neck cancer, Hodgkin lymphoma, non-small cell lung cancer, urothelial cancer, stomach cancer, and cancers that have a certain genetic condition. This medicine may be used for other purposes; ask your health care provider or pharmacist if you have questions. COMMON BRAND NAME(S): Keytruda What should I tell my health care provider before I take this medicine? They need to know if you have any of these conditions: -diabetes -immune system problems -inflammatory bowel disease -liver disease -lung or breathing disease -lupus -organ transplant -an unusual or allergic reaction to pembrolizumab, other medicines, foods, dyes, or preservatives -pregnant or trying to get pregnant -breast-feeding How should I use this medicine? This medicine is for infusion into a vein. It is given by a health care professional in a hospital or clinic setting. A special MedGuide will be given to you before each treatment. Be sure to read this information carefully each time. Talk to your pediatrician regarding the use of this medicine in children. While this drug may be prescribed for selected conditions, precautions do apply. Overdosage: If you think you have taken too much of this medicine contact a poison control center or emergency room at once. NOTE: This medicine is only for you. Do not share this medicine with others. What if I miss a dose? It is important not to miss your dose. Call your doctor or health care professional if you are unable to keep an appointment. What may interact with this medicine? Interactions have not been studied. Give your health care provider a list of all the medicines, herbs, non-prescription drugs, or dietary supplements you use. Also tell them if you smoke, drink alcohol, or use illegal drugs. Some items may interact with your  medicine. This list may not describe all possible interactions. Give your health care provider a list of all the medicines, herbs, non-prescription drugs, or dietary supplements you use. Also tell them if you smoke, drink alcohol, or use illegal drugs. Some items may interact with your medicine. What should I watch for while using this medicine? Your condition will be monitored carefully while you are receiving this medicine. You may need blood work done while you are taking this medicine. Do not become pregnant while taking this medicine or for 4 months after stopping it. Women should inform their doctor if they wish to become pregnant or think they might be pregnant. There is a potential for serious side effects to an unborn child. Talk to your health care professional or pharmacist for more information. Do not breast-feed an infant while taking this medicine or for 4 months after the last dose. What side effects may I notice from receiving this medicine? Side effects that you should report to your doctor or health care professional as soon as possible: -allergic reactions like skin rash, itching or hives, swelling of the face, lips, or tongue -bloody or black, tarry -breathing problems -changes in vision -chest pain -chills -constipation -cough -dizziness or feeling faint or lightheaded -fast or irregular heartbeat -fever -flushing -hair loss -low blood counts - this medicine may decrease the number of white blood cells, red blood cells and platelets. You may be at increased risk for infections and bleeding. -muscle pain -muscle weakness -persistent headache -signs and symptoms of high blood sugar such as dizziness; dry mouth; dry skin; fruity breath; nausea; stomach pain; increased hunger or thirst; increased urination -signs and symptoms of kidney  injury like trouble passing urine or change in the amount of urine -signs and symptoms of liver injury like dark urine, light-colored  stools, loss of appetite, nausea, right upper belly pain, yellowing of the eyes or skin -stomach pain -sweating -weight loss Side effects that usually do not require medical attention (report to your doctor or health care professional if they continue or are bothersome): -decreased appetite -diarrhea -tiredness This list may not describe all possible side effects. Call your doctor for medical advice about side effects. You may report side effects to FDA at 1-800-FDA-1088. Where should I keep my medicine? This drug is given in a hospital or clinic and will not be stored at home. NOTE: This sheet is a summary. It may not cover all possible information. If you have questions about this medicine, talk to your doctor, pharmacist, or health care provider.  2018 Elsevier/Gold Standard (2015-10-09 12:29:36) Nanoparticle Albumin-Bound Paclitaxel injection What is this medicine? NANOPARTICLE ALBUMIN-BOUND PACLITAXEL (Na no PAHR ti kuhl al BYOO muhn-bound PAK li TAX el) is a chemotherapy drug. It targets fast dividing cells, like cancer cells, and causes these cells to die. This medicine is used to treat advanced breast cancer and advanced lung cancer. This medicine may be used for other purposes; ask your health care provider or pharmacist if you have questions. COMMON BRAND NAME(S): Abraxane What should I tell my health care provider before I take this medicine? They need to know if you have any of these conditions: -kidney disease -liver disease -low blood counts, like low platelets, red blood cells, or white blood cells -recent or ongoing radiation therapy -an unusual or allergic reaction to paclitaxel, albumin, other chemotherapy, other medicines, foods, dyes, or preservatives -pregnant or trying to get pregnant -breast-feeding How should I use this medicine? This drug is given as an infusion into a vein. It is administered in a hospital or clinic by a specially trained health care  professional. Talk to your pediatrician regarding the use of this medicine in children. Special care may be needed. Overdosage: If you think you have taken too much of this medicine contact a poison control center or emergency room at once. NOTE: This medicine is only for you. Do not share this medicine with others. What if I miss a dose? It is important not to miss your dose. Call your doctor or health care professional if you are unable to keep an appointment. What may interact with this medicine? -cyclosporine -diazepam -ketoconazole -medicines to increase blood counts like filgrastim, pegfilgrastim, sargramostim -other chemotherapy drugs like cisplatin, doxorubicin, epirubicin, etoposide, teniposide, vincristine -quinidine -testosterone -vaccines -verapamil Talk to your doctor or health care professional before taking any of these medicines: -acetaminophen -aspirin -ibuprofen -ketoprofen -naproxen This list may not describe all possible interactions. Give your health care provider a list of all the medicines, herbs, non-prescription drugs, or dietary supplements you use. Also tell them if you smoke, drink alcohol, or use illegal drugs. Some items may interact with your medicine. What should I watch for while using this medicine? Your condition will be monitored carefully while you are receiving this medicine. You will need important blood work done while you are taking this medicine. This medicine can cause serious allergic reactions. If you experience allergic reactions like skin rash, itching or hives, swelling of the face, lips, or tongue, tell your doctor or health care professional right away. In some cases, you may be given additional medicines to help with side effects. Follow all directions for their use. This  drug may make you feel generally unwell. This is not uncommon, as chemotherapy can affect healthy cells as well as cancer cells. Report any side effects. Continue your  course of treatment even though you feel ill unless your doctor tells you to stop. Call your doctor or health care professional for advice if you get a fever, chills or sore throat, or other symptoms of a cold or flu. Do not treat yourself. This drug decreases your body's ability to fight infections. Try to avoid being around people who are sick. This medicine may increase your risk to bruise or bleed. Call your doctor or health care professional if you notice any unusual bleeding. Be careful brushing and flossing your teeth or using a toothpick because you may get an infection or bleed more easily. If you have any dental work done, tell your dentist you are receiving this medicine. Avoid taking products that contain aspirin, acetaminophen, ibuprofen, naproxen, or ketoprofen unless instructed by your doctor. These medicines may hide a fever. Do not become pregnant while taking this medicine. Women should inform their doctor if they wish to become pregnant or think they might be pregnant. There is a potential for serious side effects to an unborn child. Talk to your health care professional or pharmacist for more information. Do not breast-feed an infant while taking this medicine. Men are advised not to father a child while receiving this medicine. What side effects may I notice from receiving this medicine? Side effects that you should report to your doctor or health care professional as soon as possible: -allergic reactions like skin rash, itching or hives, swelling of the face, lips, or tongue -low blood counts - This drug may decrease the number of white blood cells, red blood cells and platelets. You may be at increased risk for infections and bleeding. -signs of infection - fever or chills, cough, sore throat, pain or difficulty passing urine -signs of decreased platelets or bleeding - bruising, pinpoint red spots on the skin, black, tarry stools, nosebleeds -signs of decreased red blood cells -  unusually weak or tired, fainting spells, lightheadedness -breathing problems -changes in vision -chest pain -high or low blood pressure -mouth sores -nausea and vomiting -pain, swelling, redness or irritation at the injection site -pain, tingling, numbness in the hands or feet -slow or irregular heartbeat -swelling of the ankle, feet, hands Side effects that usually do not require medical attention (report to your doctor or health care professional if they continue or are bothersome): -aches, pains -changes in the color of fingernails -diarrhea -hair loss -loss of appetite This list may not describe all possible side effects. Call your doctor for medical advice about side effects. You may report side effects to FDA at 1-800-FDA-1088. Where should I keep my medicine? This drug is given in a hospital or clinic and will not be stored at home. NOTE: This sheet is a summary. It may not cover all possible information. If you have questions about this medicine, talk to your doctor, pharmacist, or health care provider.  2018 Elsevier/Gold Standard (2014-11-01 10:05:20) Carboplatin injection What is this medicine? CARBOPLATIN (KAR boe pla tin) is a chemotherapy drug. It targets fast dividing cells, like cancer cells, and causes these cells to die. This medicine is used to treat ovarian cancer and many other cancers. This medicine may be used for other purposes; ask your health care provider or pharmacist if you have questions. COMMON BRAND NAME(S): Paraplatin What should I tell my health care provider before I  take this medicine? They need to know if you have any of these conditions: -blood disorders -hearing problems -kidney disease -recent or ongoing radiation therapy -an unusual or allergic reaction to carboplatin, cisplatin, other chemotherapy, other medicines, foods, dyes, or preservatives -pregnant or trying to get pregnant -breast-feeding How should I use this medicine? This drug  is usually given as an infusion into a vein. It is administered in a hospital or clinic by a specially trained health care professional. Talk to your pediatrician regarding the use of this medicine in children. Special care may be needed. Overdosage: If you think you have taken too much of this medicine contact a poison control center or emergency room at once. NOTE: This medicine is only for you. Do not share this medicine with others. What if I miss a dose? It is important not to miss a dose. Call your doctor or health care professional if you are unable to keep an appointment. What may interact with this medicine? -medicines for seizures -medicines to increase blood counts like filgrastim, pegfilgrastim, sargramostim -some antibiotics like amikacin, gentamicin, neomycin, streptomycin, tobramycin -vaccines Talk to your doctor or health care professional before taking any of these medicines: -acetaminophen -aspirin -ibuprofen -ketoprofen -naproxen This list may not describe all possible interactions. Give your health care provider a list of all the medicines, herbs, non-prescription drugs, or dietary supplements you use. Also tell them if you smoke, drink alcohol, or use illegal drugs. Some items may interact with your medicine. What should I watch for while using this medicine? Your condition will be monitored carefully while you are receiving this medicine. You will need important blood work done while you are taking this medicine. This drug may make you feel generally unwell. This is not uncommon, as chemotherapy can affect healthy cells as well as cancer cells. Report any side effects. Continue your course of treatment even though you feel ill unless your doctor tells you to stop. In some cases, you may be given additional medicines to help with side effects. Follow all directions for their use. Call your doctor or health care professional for advice if you get a fever, chills or sore  throat, or other symptoms of a cold or flu. Do not treat yourself. This drug decreases your body's ability to fight infections. Try to avoid being around people who are sick. This medicine may increase your risk to bruise or bleed. Call your doctor or health care professional if you notice any unusual bleeding. Be careful brushing and flossing your teeth or using a toothpick because you may get an infection or bleed more easily. If you have any dental work done, tell your dentist you are receiving this medicine. Avoid taking products that contain aspirin, acetaminophen, ibuprofen, naproxen, or ketoprofen unless instructed by your doctor. These medicines may hide a fever. Do not become pregnant while taking this medicine. Women should inform their doctor if they wish to become pregnant or think they might be pregnant. There is a potential for serious side effects to an unborn child. Talk to your health care professional or pharmacist for more information. Do not breast-feed an infant while taking this medicine. What side effects may I notice from receiving this medicine? Side effects that you should report to your doctor or health care professional as soon as possible: -allergic reactions like skin rash, itching or hives, swelling of the face, lips, or tongue -signs of infection - fever or chills, cough, sore throat, pain or difficulty passing urine -signs of decreased  platelets or bleeding - bruising, pinpoint red spots on the skin, black, tarry stools, nosebleeds -signs of decreased red blood cells - unusually weak or tired, fainting spells, lightheadedness -breathing problems -changes in hearing -changes in vision -chest pain -high blood pressure -low blood counts - This drug may decrease the number of white blood cells, red blood cells and platelets. You may be at increased risk for infections and bleeding. -nausea and vomiting -pain, swelling, redness or irritation at the injection site -pain,  tingling, numbness in the hands or feet -problems with balance, talking, walking -trouble passing urine or change in the amount of urine Side effects that usually do not require medical attention (report to your doctor or health care professional if they continue or are bothersome): -hair loss -loss of appetite -metallic taste in the mouth or changes in taste This list may not describe all possible side effects. Call your doctor for medical advice about side effects. You may report side effects to FDA at 1-800-FDA-1088. Where should I keep my medicine? This drug is given in a hospital or clinic and will not be stored at home. NOTE: This sheet is a summary. It may not cover all possible information. If you have questions about this medicine, talk to your doctor, pharmacist, or health care provider.  2018 Elsevier/Gold Standard (2007-04-06 14:38:05)

## 2017-03-04 ENCOUNTER — Ambulatory Visit
Admission: RE | Admit: 2017-03-04 | Discharge: 2017-03-04 | Disposition: A | Discharge status: Home / Self Care | Payer: Medicare HMO | Source: Ambulatory Visit | Attending: Vascular Surgery | Admitting: Vascular Surgery

## 2017-03-04 ENCOUNTER — Encounter
Admission: RE | Disposition: A | Discharge status: Home / Self Care | Payer: Self-pay | Source: Ambulatory Visit | Attending: Vascular Surgery

## 2017-03-04 DIAGNOSIS — Z833 Family history of diabetes mellitus: Secondary | ICD-10-CM | POA: Diagnosis not present

## 2017-03-04 DIAGNOSIS — Z880 Allergy status to penicillin: Secondary | ICD-10-CM | POA: Insufficient documentation

## 2017-03-04 DIAGNOSIS — Z79899 Other long term (current) drug therapy: Secondary | ICD-10-CM | POA: Insufficient documentation

## 2017-03-04 DIAGNOSIS — I110 Hypertensive heart disease with heart failure: Secondary | ICD-10-CM | POA: Insufficient documentation

## 2017-03-04 DIAGNOSIS — Z888 Allergy status to other drugs, medicaments and biological substances status: Secondary | ICD-10-CM | POA: Diagnosis not present

## 2017-03-04 DIAGNOSIS — Z9889 Other specified postprocedural states: Secondary | ICD-10-CM | POA: Diagnosis not present

## 2017-03-04 DIAGNOSIS — Z90710 Acquired absence of both cervix and uterus: Secondary | ICD-10-CM | POA: Insufficient documentation

## 2017-03-04 DIAGNOSIS — Z853 Personal history of malignant neoplasm of breast: Secondary | ICD-10-CM | POA: Insufficient documentation

## 2017-03-04 DIAGNOSIS — Z87440 Personal history of urinary (tract) infections: Secondary | ICD-10-CM | POA: Diagnosis not present

## 2017-03-04 DIAGNOSIS — F17200 Nicotine dependence, unspecified, uncomplicated: Secondary | ICD-10-CM | POA: Insufficient documentation

## 2017-03-04 DIAGNOSIS — C3432 Malignant neoplasm of lower lobe, left bronchus or lung: Secondary | ICD-10-CM | POA: Insufficient documentation

## 2017-03-04 DIAGNOSIS — Z91041 Radiographic dye allergy status: Secondary | ICD-10-CM | POA: Insufficient documentation

## 2017-03-04 DIAGNOSIS — Z8249 Family history of ischemic heart disease and other diseases of the circulatory system: Secondary | ICD-10-CM | POA: Diagnosis not present

## 2017-03-04 DIAGNOSIS — M797 Fibromyalgia: Secondary | ICD-10-CM | POA: Insufficient documentation

## 2017-03-04 DIAGNOSIS — G629 Polyneuropathy, unspecified: Secondary | ICD-10-CM | POA: Insufficient documentation

## 2017-03-04 DIAGNOSIS — Z91048 Other nonmedicinal substance allergy status: Secondary | ICD-10-CM | POA: Insufficient documentation

## 2017-03-04 DIAGNOSIS — Z9981 Dependence on supplemental oxygen: Secondary | ICD-10-CM | POA: Diagnosis not present

## 2017-03-04 DIAGNOSIS — M069 Rheumatoid arthritis, unspecified: Secondary | ICD-10-CM | POA: Insufficient documentation

## 2017-03-04 DIAGNOSIS — Z823 Family history of stroke: Secondary | ICD-10-CM | POA: Insufficient documentation

## 2017-03-04 DIAGNOSIS — Z882 Allergy status to sulfonamides status: Secondary | ICD-10-CM | POA: Insufficient documentation

## 2017-03-04 DIAGNOSIS — I509 Heart failure, unspecified: Secondary | ICD-10-CM | POA: Diagnosis not present

## 2017-03-04 DIAGNOSIS — J449 Chronic obstructive pulmonary disease, unspecified: Secondary | ICD-10-CM | POA: Diagnosis not present

## 2017-03-04 DIAGNOSIS — C3490 Malignant neoplasm of unspecified part of unspecified bronchus or lung: Secondary | ICD-10-CM | POA: Diagnosis not present

## 2017-03-04 HISTORY — PX: PORTA CATH INSERTION: CATH118285

## 2017-03-04 SURGERY — PORTA CATH INSERTION
Anesthesia: Moderate Sedation

## 2017-03-04 MED ORDER — SODIUM CHLORIDE 0.9 % IR SOLN
Freq: Once | Status: AC
  Administered 2017-03-04: 09:00:00
  Filled 2017-03-04: qty 2

## 2017-03-04 MED ORDER — ONDANSETRON HCL 4 MG/2ML IJ SOLN: 4.0000 mg | Freq: Four times a day (QID) | INTRAMUSCULAR | Status: DC | PRN

## 2017-03-04 MED ORDER — LIDOCAINE-EPINEPHRINE (PF) 1 %-1:200000 IJ SOLN
INTRAMUSCULAR | Status: AC
  Filled 2017-03-04: qty 30

## 2017-03-04 MED ORDER — SODIUM CHLORIDE 0.9 % IV SOLN
INTRAVENOUS | Status: DC
  Administered 2017-03-04: 09:00:00 via INTRAVENOUS

## 2017-03-04 MED ORDER — FENTANYL CITRATE (PF) 100 MCG/2ML IJ SOLN
INTRAMUSCULAR | Status: DC | PRN
  Administered 2017-03-04 (×2): 50 ug via INTRAVENOUS

## 2017-03-04 MED ORDER — CLINDAMYCIN PHOSPHATE 300 MG/50ML IV SOLN
INTRAVENOUS | Status: AC
  Administered 2017-03-04: 300 mg via INTRAVENOUS
  Filled 2017-03-04: qty 50

## 2017-03-04 MED ORDER — MIDAZOLAM HCL 5 MG/5ML IJ SOLN
INTRAMUSCULAR | Status: AC
  Filled 2017-03-04: qty 5

## 2017-03-04 MED ORDER — FENTANYL CITRATE (PF) 100 MCG/2ML IJ SOLN
INTRAMUSCULAR | Status: AC
  Filled 2017-03-04: qty 2

## 2017-03-04 MED ORDER — HYDROMORPHONE HCL 1 MG/ML IJ SOLN: 1.0000 mg | Freq: Once | INTRAMUSCULAR | Status: DC | PRN

## 2017-03-04 MED ORDER — CLINDAMYCIN PHOSPHATE 300 MG/50ML IV SOLN
INTRAVENOUS | Status: AC
  Filled 2017-03-04: qty 50

## 2017-03-04 MED ORDER — MIDAZOLAM HCL 2 MG/2ML IJ SOLN
INTRAMUSCULAR | Status: DC | PRN
  Administered 2017-03-04: 2 mg via INTRAVENOUS
  Administered 2017-03-04: 1 mg via INTRAVENOUS

## 2017-03-04 SURGICAL SUPPLY — 9 items
KIT PORT POWER 8FR ISP CVUE (Miscellaneous) ×3 IMPLANT
PACK ANGIOGRAPHY (CUSTOM PROCEDURE TRAY) ×3 IMPLANT
PAD GROUND ADULT SPLIT (MISCELLANEOUS) ×3 IMPLANT
PENCIL ELECTRO HAND CTR (MISCELLANEOUS) ×3 IMPLANT
SPONGE XRAY 4X4 16PLY STRL (MISCELLANEOUS) ×3 IMPLANT
SUT MNCRL AB 4-0 PS2 18 (SUTURE) ×3 IMPLANT
SUT PROLENE 0 CT 1 30 (SUTURE) ×3 IMPLANT
SUTURE VIC 3-0 (SUTURE) ×3 IMPLANT
TOWEL OR 17X26 4PK STRL BLUE (TOWEL DISPOSABLE) ×3 IMPLANT

## 2017-03-04 NOTE — H&P (Signed)
[facility] History & Physical Update  The patient was interviewed and re-examined.  The patient's previous History and Physical has been reviewed and is unchanged.  There is no change in the plan of care. We plan to proceed with the scheduled procedure.  Leotis Pain, MD  03/04/2017, 8:41 AM

## 2017-03-04 NOTE — Op Note (Signed)
      [facility]       Operative Note  Date: 03/04/2017  Preoperative diagnosis:  1.  Lung cancer  Postoperative diagnosis:  Same as above  Procedures: #1. Ultrasound guidance for vascular access to the right internal jugular vein. #2. Fluoroscopic guidance for placement of catheter. #3. Placement of CT compatible Port-A-Cath, right internal jugular vein.  Surgeon: Leotis Pain, MD.   Anesthesia: Local with moderate conscious sedation for approximately 20 minutes using 3 mg of Versed and 100 Mcg of Fentanyl  Fluoroscopy time: less than 1 minute  Contrast used: 0  Estimated blood loss: 3 cc  Indication for the procedure:  The patient is a 77 y.o.female with lung cancer.  The patient needs a Port-A-Cath for durable venous access, chemotherapy, lab draws, and CT scans. We are asked to place this. Risks and benefits were discussed and informed consent was obtained.  Description of procedure: The patient was brought to the vascular and interventional radiology suite.  Moderate conscious sedation was administered throughout the procedure during a face to face encounter with the patient with my supervision of the RN administering medicines and monitoring the patient's vital signs, pulse oximetry, telemetry and mental status throughout from the start of the procedure until the patient was taken to the recovery room. The right neck chest and shoulder were sterilely prepped and draped, and a sterile surgical field was created. Ultrasound was used to help visualize a patent right internal jugular vein. This was then accessed under direct ultrasound guidance without difficulty with the Seldinger needle and a permanent image was recorded. A J-wire was placed. After skin nick and dilatation, the peel-away sheath was then placed over the wire. I then anesthetized an area under the clavicle approximately 1-2 fingerbreadths. A transverse incision was created and an inferior pocket was  created with electrocautery and blunt dissection. The port was then brought onto the field, placed into the pocket and secured to the chest wall with 2 Prolene sutures. The catheter was connected to the port and tunneled from the subclavicular incision to the access site. Fluoroscopic guidance was then used to cut the catheter to an appropriate length. The catheter was then placed through the peel-away sheath and the peel-away sheath was removed. The catheter tip was parked in excellent location under fluorocoscopic guidance in the cavoatrial junction. The pocket was then irrigated with antibiotic impregnated saline and the wound was closed with a running 3-0 Vicryl and a 4-0 Monocryl. The access incision was closed with a single 4-0 Monocryl. The Huber needle was used to withdraw blood and flush the port with heparinized saline. Dermabond was then placed as a dressing. The patient tolerated the procedure well and was taken to the recovery room in stable condition.   Leotis Pain 03/04/2017 9:24 AM   This note was created with Dragon Medical transcription system. Any errors in dictation are purely unintentional.

## 2017-03-09 ENCOUNTER — Telehealth: Payer: Self-pay | Admitting: *Deleted

## 2017-03-09 NOTE — Telephone Encounter (Signed)
Patient called and states she forgot that she had fallen a month ago and ended up getting several stitches on her left rib cage area, not sure if she hit the right side or not.

## 2017-03-10 ENCOUNTER — Telehealth: Payer: Self-pay | Admitting: *Deleted

## 2017-03-10 NOTE — Telephone Encounter (Signed)
Called patient to inform her that her [facility] report is not back.  Asked if it was okay to move her appointment out a week .  Anticipated report date 03/17/17.  Will have scheduling call to reschedule her.  Patient verbalized understanding.

## 2017-03-10 NOTE — Telephone Encounter (Signed)
Per Rodena Piety 03/10/17 staff message to move appt 1 week out. lab/MD assessment /*NEW* Cycle #1 Carcoplatin/Abraxane/Pembrolizumab per 03/03/17 los. Patient is aware of tim.

## 2017-03-12 ENCOUNTER — Other Ambulatory Visit: Payer: Medicare HMO

## 2017-03-12 ENCOUNTER — Ambulatory Visit: Payer: Medicare HMO | Admitting: Hematology and Oncology

## 2017-03-12 ENCOUNTER — Ambulatory Visit: Payer: Medicare HMO

## 2017-03-12 LAB — SURGICAL PATHOLOGY

## 2017-03-13 ENCOUNTER — Encounter: Payer: Self-pay | Admitting: Hematology and Oncology

## 2017-03-15 ENCOUNTER — Other Ambulatory Visit: Payer: Self-pay | Admitting: Hematology and Oncology

## 2017-03-15 DIAGNOSIS — M899 Disorder of bone, unspecified: Secondary | ICD-10-CM

## 2017-03-15 DIAGNOSIS — C3432 Malignant neoplasm of lower lobe, left bronchus or lung: Secondary | ICD-10-CM

## 2017-03-15 DIAGNOSIS — J90 Pleural effusion, not elsewhere classified: Secondary | ICD-10-CM

## 2017-03-15 NOTE — Progress Notes (Signed)
DISCONTINUE ON PATHWAY REGIMEN - Non-Small Cell Lung     A cycle is every 14 days:     Durvalumab   **Always confirm dose/schedule in your pharmacy ordering system**    REASON: Other Reason PRIOR TREATMENT: [id number]: Durvalumab 10 mg/kg q14 Days x up to 12 Months  START ON PATHWAY REGIMEN - Non-Small Cell Lung     A cycle is every 21 days:     Pembrolizumab      Paclitaxel      Carboplatin   **Always confirm dose/schedule in your pharmacy ordering system**    Patient Characteristics: Stage IV Metastatic, Squamous, PS = 0, 1, First Line, PD-L1 Expression Positive 1-49% (TPS) / Negative / Not Tested / Awaiting Test Results AJCC T Category: T2b Current Disease Status: Distant Metastases AJCC N Category: N2 AJCC M Category: M0 AJCC 8 Stage Grouping: IIIA Histology: Squamous Cell Line of therapy: First Line PD-L1 Expression Status: Awaiting Test Results Performance Status: PS = 0, 1 Would you be surprised if this patient died  in the next year<= I would be surprised if this patient died in the next year Intent of Therapy: Non-Curative / Palliative Intent, Discussed with Patient

## 2017-03-17 ENCOUNTER — Other Ambulatory Visit: Payer: Self-pay | Admitting: Hematology and Oncology

## 2017-03-17 ENCOUNTER — Ambulatory Visit: Payer: Medicare HMO | Admitting: Oncology

## 2017-03-18 ENCOUNTER — Other Ambulatory Visit: Payer: Self-pay | Admitting: Hematology and Oncology

## 2017-03-18 DIAGNOSIS — C3432 Malignant neoplasm of lower lobe, left bronchus or lung: Secondary | ICD-10-CM

## 2017-03-18 NOTE — Progress Notes (Signed)
[facility] day:  03/19/2017    Chief Complaint: [name] [name] is a 77 y.o. female with presumed metastatic squamous cell carcinoma of the left lower lobe who is seen for assessment prior to cycle #1 carboplatin, Abraxane, and pembrolizumab.  HPI: The patient was last seen in the medical oncology clinic on 03/03/2017.  At that time, she had posterior iliac crest/sacral pain and a chronic cough.  Alkaline phosphatase was slightly elevated.  Ultrasound guided thoracentesis revealed fluid was negative for malignancy.  Bone scan revealed a subtle focus of uptake in the left 7th rib, indeterminate.  In conjunction with her recent bronchoscopsy documenting recurrent squamous cell carcinoma and subsequent collapse of the left lower lobe, we discussed concern for metastatic disease.  Foundation One testing on 03/13/2017 revealed no reportable alterations in EGFR, RET, ALK, MET, ERBB2, BRAF, and ROS1.    Tumor was MS-stable.  TMB was 3 Muts/Mb.  There was CDKN2A loss, CDKN2B loss, KRAS G12V, MTAP loss, and TP53 R282W.  PD-L1 was 5%.  Port-a-cath was placed on 03/04/2017 by Dr. Dian Situ.  Symptomatically, patient complaining of irritation from her port. She notes that she is having trouble getting used to it. She did not have EMLA cream prior to port access Patient with multiple ares of bruising to her bilateral upper extremities. She states, "I claw myself at night because of my fibromyalgia".   Patient denies acute symptoms. She is not experiencing any increased shortness of breath. She has a chronic cough. Patient denies sweats, fevers, and significant weight loss. Her weight is down 1 pound. She is having neuropathy symptoms in the 3rd and 4th digit of her RIGHT hand. She has continued problems with her RIGHT lower extremity. She is seeing Dr. Lucky Cowboy. She notes that vascular has advised her that she has "bigger fish to fry right now".   Patient also complains of urinary  frequency and intermittent burning. She states, "I keep a UTI. I found this Azo stuff that seems to be working for me".    Patient denies pain today in the clinic.   Past Medical History:  Diagnosis Date  . Asthma   . Breast cancer ([place]) 2009   left  . Cancer ([place])   . CHF (congestive heart failure) ([place])   . CHF (congestive heart failure) ([place])   . Chronic UTI   . COPD (chronic obstructive pulmonary disease) ([place])   . Dizziness   . Fibromyalgia   . Hypertension   . Neuropathy   . Personal history of tobacco use, presenting hazards to health 05/17/2015  . Polyp, larynx   . RA (rheumatoid arthritis) ([place])   . Sinus infection    recent  . Stumbling gait    to the left  . Supplemental oxygen dependent    2.5l    Past Surgical History:  Procedure Laterality Date  . ABDOMINAL HYSTERECTOMY    . BREAST LUMPECTOMY Left 2009   chemo and radiation  . CYST EXCISION Left 02/27/2015   Procedure: CYST REMOVAL;  Surgeon: Hessie Knows, MD;  Location: [facility] ORS;  Service: Orthopedics;  Laterality: Left;  . EYE MUSCLE SURGERY Right    13 surgeries  . FLEXIBLE BRONCHOSCOPY N/A 07/01/2016   Procedure: FLEXIBLE BRONCHOSCOPY;  Surgeon: Wilhelmina Mcardle, MD;  Location: [facility] ORS;  Service: Pulmonary;  Laterality: N/A;  . FLEXIBLE BRONCHOSCOPY N/A 02/09/2017   Procedure: FLEXIBLE BRONCHOSCOPY;  Surgeon: [name], MD;  Location: [facility] ORS;  Service: Pulmonary;  Laterality: N/A;  . PORTA CATH INSERTION N/A 03/04/2017   Procedure: PORTA CATH INSERTION;  Surgeon: [name], MD;  Location: [facility];  Service: Cardiovascular;  Laterality: N/A;  . THUMB ARTHROSCOPY Left     Family History  Problem Relation Age of Onset  . Diabetes Father   . Stroke Father   . Heart attack Father   . CAD Sister     Social History:  reports that she has been smoking cigarettes.  She has a 40.00 pack-year smoking history. she has never used smokeless tobacco. She reports that she does not  drink alcohol or use drugs.  He lives with her son and grandson. The patient is alone today.   Allergies:  Allergies  Allergen Reactions  . Contrast Media [Iodinated Diagnostic Agents] Other (See Comments)    Pt was sent to the ED following contrast media injection at [place]. Unknown reason. She has been premedicated since without complications. Pt to be premedicated prior to contrast media injections  . Ioxaglate Other (See Comments)    Pt was sent to the ED following contrast media injection at [place]. Unknown reason. She has been premedicated since without complications. Pt to be premedicated prior to contrast media injections  . Sulfa Antibiotics Other (See Comments)    Unknown  . Tetracycline Hives  . White Petrolatum Other (See Comments)    Blisters  . Amoxicillin-Pot Clavulanate Rash and Other (See Comments)    Blisters in mouth Has patient had a PCN reaction causing immediate rash, facial/tongue/throat swelling, SOB or lightheadedness with hypotension: No Has patient had a PCN reaction causing severe rash involving mucus membranes or skin necrosis: No Has patient had a PCN reaction that required hospitalization: No Has patient had a PCN reaction occurring within the last 10 years: No If all of the above answers are "NO", then may proceed with Cephalosporin use.   . Tape Rash    Current Medications: Current Outpatient Medications  Medication Sig Dispense Refill  . albuterol (PROVENTIL HFA;VENTOLIN HFA) 108 (90 Base) MCG/ACT inhaler Inhale 1-2 puffs into the lungs every 6 (six) hours as needed for wheezing or shortness of breath. 1 Inhaler 2  . Calcium-Vitamin D 600-200 MG-UNIT tablet Take 1 tablet by mouth daily.     . carvedilol (COREG) 12.5 MG tablet Take 1 tablet (12.5 mg total) by mouth 2 (two) times daily with a meal. 60 tablet 0  . cholecalciferol (VITAMIN D) 1000 units tablet Take 1,000 Units by mouth daily.    . clonazePAM (KLONOPIN) 1 MG tablet  Take 1 mg by mouth 2 (two) times daily as needed for anxiety.     . gabapentin (NEURONTIN) 600 MG tablet Take 600 mg by mouth 3 (three) times daily.     [name] HYDROcodone-acetaminophen (NORCO/VICODIN) 5-325 MG tablet Take 1 tablet by mouth 2 (two) times daily.     [name] imipramine (TOFRANIL) 25 MG tablet Take 25 mg by mouth at bedtime.     . Multiple Vitamin (MULTIVITAMIN) capsule Take 1 capsule by mouth daily.    . nitroGLYCERIN (NITROSTAT) 0.4 MG SL tablet Place 0.4 mg under the tongue every 5 (five) minutes as needed for chest pain.     . Tiotropium Bromide Monohydrate (SPIRIVA RESPIMAT) 2.5 MCG/ACT AERS Inhale 2.5 mcg into the lungs 2 (two) times daily. 1 Inhaler 0  . traMADol (ULTRAM) 50 MG tablet Take 100 mg by mouth daily.     . vitamin C (ASCORBIC ACID) 500 MG tablet  Take 500 mg by mouth daily.    [name] ZINC SULFATE PO Take 1 tablet by mouth daily.     [name] zolpidem (AMBIEN) 5 MG tablet Take 5 mg by mouth at bedtime.    . lidocaine-prilocaine (EMLA) cream Apply 1 application topically as needed. 30 g 3  . potassium chloride (K-DUR) 10 MEQ tablet Take 1 tablet (10 mEq total) by mouth daily. (Patient not taking: Reported on 02/16/2017) 10 tablet 0  . sucralfate (CARAFATE) 1 g tablet Take 1 tablet (1 g total) by mouth 3 (three) times daily. (Patient not taking: Reported on 03/03/2017) 90 tablet 6   No current facility-administered medications for this visit.    Facility-Administered Medications Ordered in Other Visits  Medication Dose Route Frequency Provider Last Rate Last Dose  . heparin lock flush 100 unit/mL  500 Units Intravenous Once [name], Melissa C, MD      . sodium chloride flush (NS) 0.9 % injection 10 mL  10 mL Intravenous PRN Lequita Asal, MD   10 mL at 03/19/17 4287    Review of Systems:  GENERAL:  Fatigue.  No fever, chills, or sweats.  No fevers. Weight down 1 pound. PERFORMANCE STATUS (ECOG):  2 HEENT:  No visual changes, runny nose, sore throat, mouth sores or  tenderness. Lungs: Shortness of breath, chronic.  Cough.  No hemoptysis.  On oxygen 2 liter/min via [place]. Cardiac:  No chest pain, palpitations, orthopnea, or PND. GI:   No nausea, vomiting, diarrhea, constipation, melena or hematochezia. GU:  No urgency, dysuria, or hematuria.  Musculoskeletal:  Fibromyalgia.  Right sided pain.  Osteoporosis.  Joints ache.   No muscle tenderness. Extremities:  No pain or swelling. Skin:  No rashes or skin changes.  Port irritation. Neuro:  Neuropathy in 3rd and 4th digits of RIGHT hand. No headache, numbness or weakness, balance or coordination issues. Endocrine:  No diabetes, thyroid issues, hot flashes or night sweats. Psych:  No mood changes, depression or anxiety.  Takes Ambien for sleep. Pain:  No focal pain today. Review of systems:  All other systems reviewed and found to be negative.  Physical Exam: Blood pressure 137/83, pulse 96, temperature 97.6 F (36.4 C), temperature source Tympanic, resp. rate 20, weight 113 lb 4 oz (51.4 kg), SpO2 94 %.  GENERAL:  Thin woman sitting comfortably in the exam room in no acute distress. MENTAL STATUS:  Alert and oriented to person, place and time. HEAD:  Short gray blonde hair.  Normocephalic, atraumatic, face symmetric, no Cushingoid features. EYES:  Blue eyes.  Pupils equal round and reactive to light and accomodation.  No conjunctivitis or scleral icterus. ENT:  Oropharynx clear without lesion.  Tongue normal.  Mucous membranes moist.  RESPIRATORY:  Decreased breath left base.  No rales, wheezes or rhonchi. CARDIOVASCULAR:  Regular rate and rhythm without murmur, rub or gallop. CHEST WALL:  Port-a-cath site unremarkable. No left sided rib pain on palpation. ABDOMEN:  Soft, non tender with active bowel sounds and no hepatosplenomegaly.  No masses. SKIN:  No rashes, ulcers or lesions. EXTREMITIES: Arthritic changes in hands. Ankle edema.  No skin discoloration or tenderness.  No palpable cords. LYMPH NODES: No  palpable cervical, supraclavicular, axillary or inguinal adenopathy  NEUROLOGICAL: Unremarkable. PSYCH:  Appropriate.   Infusion on 03/19/2017  Component Date Value Ref Range Status  . Magnesium 03/19/2017 2.0  1.7 - 2.4 mg/dL Final   Performed at [facility], [address], [place], [place] [address]  . Sodium 03/19/2017  131* 135 - 145 mmol/L Final  . Potassium 03/19/2017 3.7  3.5 - 5.1 mmol/L Final  . Chloride 03/19/2017 97* 101 - 111 mmol/L Final  . CO2 03/19/2017 24  22 - 32 mmol/L Final  . Glucose, Bld 03/19/2017 137* 65 - 99 mg/dL Final  . BUN 03/19/2017 18  6 - 20 mg/dL Final  . Creatinine, Ser 03/19/2017 1.20* 0.44 - 1.00 mg/dL Final  . Calcium 03/19/2017 9.2  8.9 - 10.3 mg/dL Final  . Total Protein 03/19/2017 7.7  6.5 - 8.1 g/dL Final  . Albumin 03/19/2017 3.8  3.5 - 5.0 g/dL Final  . AST 03/19/2017 26  15 - 41 U/L Final  . ALT 03/19/2017 11* 14 - 54 U/L Final  . Alkaline Phosphatase 03/19/2017 115  38 - 126 U/L Final  . Total Bilirubin 03/19/2017 0.4  0.3 - 1.2 mg/dL Final  . GFR calc non Af Amer 03/19/2017 43* >60 mL/min Final  . GFR calc Af Amer 03/19/2017 50* >60 mL/min Final   Comment: (NOTE) The eGFR has been calculated using the CKD EPI equation. This calculation has not been validated in all clinical situations. eGFR's persistently <60 mL/min signify possible Chronic Kidney Disease.   Georgiann Hahn gap 03/19/2017 10  5 - 15 Final   Performed at [facility], [address] [place], [place] [address]  . WBC 03/19/2017 5.9  3.6 - 11.0 K/uL Final  . RBC 03/19/2017 4.12  3.80 - 5.20 MIL/uL Final  . Hemoglobin 03/19/2017 12.8  12.0 - 16.0 g/dL Final  . HCT 03/19/2017 37.3  35.0 - 47.0 % Final  . MCV 03/19/2017 90.7  80.0 - 100.0 fL Final  . MCH 03/19/2017 31.1  26.0 - 34.0 pg Final  . MCHC 03/19/2017 34.3  32.0 - 36.0 g/dL Final  . RDW 03/19/2017 16.1* 11.5 - 14.5 % Final  . Platelets 03/19/2017 178  150 - 440 K/uL Final  . Neutrophils Relative %  03/19/2017 66  % Final  . Neutro Abs 03/19/2017 3.8  1.4 - 6.5 K/uL Final  . Lymphocytes Relative 03/19/2017 17  % Final  . Lymphs Abs 03/19/2017 1.0  1.0 - 3.6 K/uL Final  . Monocytes Relative 03/19/2017 9  % Final  . Monocytes Absolute 03/19/2017 0.6  0.2 - 0.9 K/uL Final  . Eosinophils Relative 03/19/2017 6  % Final  . Eosinophils Absolute 03/19/2017 0.4  0 - 0.7 K/uL Final  . Basophils Relative 03/19/2017 2  % Final  . Basophils Absolute 03/19/2017 0.1  0 - 0.1 K/uL Final   Performed at [facility], [address], [place], [place] [address]    Assessment:  KIMORI TARTAGLIA is a 77 y.o. female with presumed metastatic squamous cell lung cancer.  She presented with clinical [id number] squamous cell lung cancer of the left lung s/p bronchoscopy and biopsy on 07/01/2016.  She has a 40 pack year smoking history.  She presented with left lower chest wall pain.  Foundation One testing on 03/13/2017 revealed no reportable alterations in EGFR, RET, ALK, MET, ERBB2, BRAF, and ROS1.    Tumor was MS-stable.  TMB was 3 Muts/Mb.  There was CDKN2A loss, CDKN2B loss, KRAS G12V, MTAP loss, and TP53 R282W.  PD-L1 was 5%.  Chest CT with contrast on 06/11/2016 revealed a 3.2 x 3.0 mass like area of focal opacity in the medial left lower lobe with obliteration of segmental airways to the anterior left lower lobe.  PET scan on 06/27/2016 revealed a 4.3  x 3.2 cm central left lower lobe lung lesion (SUV 14.3) consistent with primary bronchogenic carcinoma.  There was equivocal nodal tissue in the subcarinal station demonstrating mild hypermetabolism (SUV 3.6). There was more peripheral left lower lobe increased atelectasis with mucoid impaction which is likely secondary to endobronchial obstruction.  There was a small left pleural effusion.  Head MRI on 07/07/2016 revealed no evidence of metastatic disease.  She received radiation from 07/22/2016 - 09/19/2016.  She received 5 weeks of concurrent carboplatin  and Taxol (07/24/2016 - 08/11/2016; 09/05/2016 - 09/16/2016). She requires a reduced dose of Benadryl (25 mg) for her premedication.  Week #3 was postponed secondary to chest pain and evaluation.  She missed a couple of weeks.  Chest CT on 11/21/2016 revealed progressive collapse of the LEFT lower lobe presumably related to central obstructing mass.  Mass obstructed the LEFT lower lobe bronchus, although mass was not well demonstrated.  There were small effusions (no change).  There was centrilobular emphysema unchanged.  There was no evidence pneumonia.  PET scan on 01/27/2017 revealed interval response to therapy.  The previously noted hypermetabolism associated with left lower lobe perihilar lung mass has resolved in the interval.  There was a moderate left pleural effusion that had increased in volume from previous PET-CT and there was now complete atelectasis/consolidation of the left lower lobe, which may obscure residual mass.  There was no new sites of hypermetabolism or evidence of distant metastatic disease.  Bronchoscopy on 02/09/2017 revealed no evidence of endobronchial tumor.  There was stenosis at the entry to the left lower posterior segments which could not be entered.  There was mildly erythematous mucosa with copious secretions in the left lower lobe.  Brushings were positive for non-small cell carcinoma, favor squamous cell carcinoma.  Ultrasound guided thoracentesis on 02/17/2017 revealed was negative for malignancy.  Bone scan on 02/27/2017 revealed a subtle focus of uptake in the left 7th rib, indeterminate.  She has a normocytic anemia likely due to chemotherapy.  Work-up on 07/24/2016 revealed the following normal labs: ferritin (103), B12 (345), folate(19.7).  Iron saturation was 8% and TIBC was 258.  She has a history of stage IA left breast cancer in 02/2005.  She underwent lumpectomy.  Pathology revealed a T1cN0 lesion.  Tumor was ER + , PR +, and Her2/neu -.  Two sentinel  lymph nodes were negative.  She received chemotherapy (4 cycles of AC and possibly an abbreviated course of Taxol- no records available).  She received radiation.  She completed Femara in 08/2010.  She has fibromyalgia.  She has advanced thoracoabdominal aortic atherosclerosis with fusiform dilatation of the descending thoracic aorta up to 4.3 cm.  She is followed by Dr Lucky Cowboy.  Symptomatically, she has a chronic cough.  She has symptoms of a UTI.  Breath sounds are decreased in LLL.  Sodium is 131.   Plan: 1.  Labs today:  CBC with diff, CMP, Mg, TSH, UA with C&S 2.  Discuss Foundation One testing.  No actionable mutation in EGFR, ALK, RET, MET, ERBB2, BRAF, and ROS1.  PDL-1 was 5% (positive). 3.  Discuss plan for carboplatin + Abraxane + pembrolizumab (Keytruda) every 3 weeks x 4 cycles followed by pembrolizumab every 3 weeks.  Cycles #1-4, treatment on day 1, 8, and 15.  Day 1 consists of carboplatin + Abraxane + pembrolizumab.  Day 8 and 15 consists of Abraxane alone.  Side effects reviewed.  Patient consented to treatment. 4.  Discuss urinary symptoms. Will send UA  with C&S today. 5.  Discuss symptom management. Patient has pain medications and antiemetics at home for PRN use.  6.  Begin day 1 of cycle #1 carboplatin and Abraxane + pembrolizumab. 7.  RTC in 1 week for NP assessment, labs (CBC with diff, CMP), and day 8 Abraxane. 8.  RTC in 2 weeks for MD assessment, labs (CBC with diff, CMP, Mg), and day 15 Abraxane. 9.  RTC in 3 weeks for MD assessment, labs (CBC with diff, CMP), and day 1 of cycle #2 carboplatin + Abraxane + pembrolizumab.   Honor Loh, NP  03/19/2017, 10:08 AM   I saw and evaluated the patient, participating in the key portions of the service and reviewing pertinent diagnostic studies and records.  I reviewed the nurse practitioner's note and agree with the findings and the plan.  The assessment and plan were discussed with the patient.  Several questions were asked by the  patient and answered.   Nolon Stalls, MD 03/19/2017,10:08 AM

## 2017-03-19 ENCOUNTER — Inpatient Hospital Stay: Payer: Medicare HMO

## 2017-03-19 ENCOUNTER — Inpatient Hospital Stay: Payer: Medicare HMO | Attending: Hematology and Oncology

## 2017-03-19 ENCOUNTER — Encounter: Payer: Self-pay | Admitting: Hematology and Oncology

## 2017-03-19 ENCOUNTER — Inpatient Hospital Stay (HOSPITAL_BASED_OUTPATIENT_CLINIC_OR_DEPARTMENT_OTHER): Payer: Medicare HMO | Admitting: Hematology and Oncology

## 2017-03-19 VITALS — BP 137/83 | HR 96 | Temp 97.6°F | Resp 20 | Wt 113.2 lb

## 2017-03-19 DIAGNOSIS — M797 Fibromyalgia: Secondary | ICD-10-CM | POA: Insufficient documentation

## 2017-03-19 DIAGNOSIS — R350 Frequency of micturition: Secondary | ICD-10-CM

## 2017-03-19 DIAGNOSIS — E871 Hypo-osmolality and hyponatremia: Secondary | ICD-10-CM | POA: Insufficient documentation

## 2017-03-19 DIAGNOSIS — I700 Atherosclerosis of aorta: Secondary | ICD-10-CM | POA: Insufficient documentation

## 2017-03-19 DIAGNOSIS — J90 Pleural effusion, not elsewhere classified: Secondary | ICD-10-CM | POA: Insufficient documentation

## 2017-03-19 DIAGNOSIS — R197 Diarrhea, unspecified: Secondary | ICD-10-CM | POA: Insufficient documentation

## 2017-03-19 DIAGNOSIS — Z9981 Dependence on supplemental oxygen: Secondary | ICD-10-CM | POA: Insufficient documentation

## 2017-03-19 DIAGNOSIS — Z170 Estrogen receptor positive status [ER+]: Secondary | ICD-10-CM | POA: Insufficient documentation

## 2017-03-19 DIAGNOSIS — C3432 Malignant neoplasm of lower lobe, left bronchus or lung: Secondary | ICD-10-CM | POA: Diagnosis not present

## 2017-03-19 DIAGNOSIS — I110 Hypertensive heart disease with heart failure: Secondary | ICD-10-CM | POA: Insufficient documentation

## 2017-03-19 DIAGNOSIS — D701 Agranulocytosis secondary to cancer chemotherapy: Secondary | ICD-10-CM | POA: Diagnosis not present

## 2017-03-19 DIAGNOSIS — J449 Chronic obstructive pulmonary disease, unspecified: Secondary | ICD-10-CM | POA: Diagnosis not present

## 2017-03-19 DIAGNOSIS — Z5111 Encounter for antineoplastic chemotherapy: Secondary | ICD-10-CM

## 2017-03-19 DIAGNOSIS — Z79899 Other long term (current) drug therapy: Secondary | ICD-10-CM | POA: Diagnosis not present

## 2017-03-19 DIAGNOSIS — Z7189 Other specified counseling: Secondary | ICD-10-CM

## 2017-03-19 DIAGNOSIS — Z792 Long term (current) use of antibiotics: Secondary | ICD-10-CM | POA: Diagnosis not present

## 2017-03-19 DIAGNOSIS — Z87440 Personal history of urinary (tract) infections: Secondary | ICD-10-CM | POA: Insufficient documentation

## 2017-03-19 DIAGNOSIS — Z5112 Encounter for antineoplastic immunotherapy: Secondary | ICD-10-CM | POA: Insufficient documentation

## 2017-03-19 DIAGNOSIS — M069 Rheumatoid arthritis, unspecified: Secondary | ICD-10-CM | POA: Diagnosis not present

## 2017-03-19 DIAGNOSIS — Z853 Personal history of malignant neoplasm of breast: Secondary | ICD-10-CM | POA: Insufficient documentation

## 2017-03-19 DIAGNOSIS — Z9221 Personal history of antineoplastic chemotherapy: Secondary | ICD-10-CM

## 2017-03-19 DIAGNOSIS — N390 Urinary tract infection, site not specified: Secondary | ICD-10-CM | POA: Diagnosis not present

## 2017-03-19 DIAGNOSIS — Z9223 Personal history of estrogen therapy: Secondary | ICD-10-CM

## 2017-03-19 DIAGNOSIS — D649 Anemia, unspecified: Secondary | ICD-10-CM | POA: Diagnosis not present

## 2017-03-19 DIAGNOSIS — Z923 Personal history of irradiation: Secondary | ICD-10-CM

## 2017-03-19 DIAGNOSIS — J9811 Atelectasis: Secondary | ICD-10-CM

## 2017-03-19 DIAGNOSIS — I509 Heart failure, unspecified: Secondary | ICD-10-CM | POA: Insufficient documentation

## 2017-03-19 DIAGNOSIS — R5383 Other fatigue: Secondary | ICD-10-CM | POA: Diagnosis not present

## 2017-03-19 DIAGNOSIS — R110 Nausea: Secondary | ICD-10-CM | POA: Diagnosis not present

## 2017-03-19 DIAGNOSIS — G620 Drug-induced polyneuropathy: Secondary | ICD-10-CM | POA: Insufficient documentation

## 2017-03-19 DIAGNOSIS — M899 Disorder of bone, unspecified: Secondary | ICD-10-CM

## 2017-03-19 DIAGNOSIS — F17210 Nicotine dependence, cigarettes, uncomplicated: Secondary | ICD-10-CM | POA: Diagnosis not present

## 2017-03-19 DIAGNOSIS — T451X5S Adverse effect of antineoplastic and immunosuppressive drugs, sequela: Secondary | ICD-10-CM | POA: Diagnosis not present

## 2017-03-19 LAB — COMPREHENSIVE METABOLIC PANEL
ALT: 11 U/L — ABNORMAL LOW (ref 14–54)
AST: 26 U/L (ref 15–41)
Albumin: 3.8 g/dL (ref 3.5–5.0)
Alkaline Phosphatase: 115 U/L (ref 38–126)
Anion gap: 10 (ref 5–15)
BUN: 18 mg/dL (ref 6–20)
CO2: 24 mmol/L (ref 22–32)
Calcium: 9.2 mg/dL (ref 8.9–10.3)
Chloride: 97 mmol/L — ABNORMAL LOW (ref 101–111)
Creatinine, Ser: 1.2 mg/dL — ABNORMAL HIGH (ref 0.44–1.00)
GFR calc Af Amer: 50 mL/min — ABNORMAL LOW (ref >60)
GFR calc non Af Amer: 43 mL/min — ABNORMAL LOW (ref >60)
Glucose, Bld: 137 mg/dL — ABNORMAL HIGH (ref 65–99)
Potassium: 3.7 mmol/L (ref 3.5–5.1)
Sodium: 131 mmol/L — ABNORMAL LOW (ref 135–145)
Total Bilirubin: 0.4 mg/dL (ref 0.3–1.2)
Total Protein: 7.7 g/dL (ref 6.5–8.1)

## 2017-03-19 LAB — CBC WITH DIFFERENTIAL/PLATELET
Basophils Absolute: 0.1 10*3/uL (ref 0–0.1)
Basophils Relative: 2 %
Eosinophils Absolute: 0.4 10*3/uL (ref 0–0.7)
Eosinophils Relative: 6 %
HCT: 37.3 % (ref 35.0–47.0)
Hemoglobin: 12.8 g/dL (ref 12.0–16.0)
Lymphocytes Relative: 17 %
Lymphs Abs: 1 10*3/uL (ref 1.0–3.6)
MCH: 31.1 pg (ref 26.0–34.0)
MCHC: 34.3 g/dL (ref 32.0–36.0)
MCV: 90.7 fL (ref 80.0–100.0)
Monocytes Absolute: 0.6 10*3/uL (ref 0.2–0.9)
Monocytes Relative: 9 %
Neutro Abs: 3.8 10*3/uL (ref 1.4–6.5)
Neutrophils Relative %: 66 %
Platelets: 178 10*3/uL (ref 150–440)
RBC: 4.12 MIL/uL (ref 3.80–5.20)
RDW: 16.1 % — ABNORMAL HIGH (ref 11.5–14.5)
WBC: 5.9 10*3/uL (ref 3.6–11.0)

## 2017-03-19 LAB — URINALYSIS, COMPLETE (UACMP) WITH MICROSCOPIC
Bilirubin Urine: NEGATIVE
Glucose, UA: NEGATIVE mg/dL
HGB URINE DIPSTICK: NEGATIVE
Ketones, ur: NEGATIVE mg/dL
NITRITE: POSITIVE — AB
PROTEIN: NEGATIVE mg/dL
Specific Gravity, Urine: 1.004 — ABNORMAL LOW (ref 1.005–1.030)
pH: 6 (ref 5.0–8.0)

## 2017-03-19 LAB — TSH: TSH: 2.482 u[IU]/mL (ref 0.350–4.500)

## 2017-03-19 LAB — MAGNESIUM: Magnesium: 2 mg/dL (ref 1.7–2.4)

## 2017-03-19 MED ORDER — SODIUM CHLORIDE 0.9 % IV SOLN
Freq: Once | INTRAVENOUS | Status: AC
  Administered 2017-03-19: 11:00:00 via INTRAVENOUS
  Filled 2017-03-19: qty 1000

## 2017-03-19 MED ORDER — DEXAMETHASONE 4 MG PO TABS: ORAL_TABLET | ORAL | 1 refills | Status: DC

## 2017-03-19 MED ORDER — PALONOSETRON HCL INJECTION 0.25 MG/5ML
0.2500 mg | Freq: Once | INTRAVENOUS | Status: AC
  Administered 2017-03-19: 0.25 mg via INTRAVENOUS
  Filled 2017-03-19: qty 5

## 2017-03-19 MED ORDER — SODIUM CHLORIDE 0.9% FLUSH
10.0000 mL | INTRAVENOUS | Status: DC | PRN
  Administered 2017-03-19: 10 mL via INTRAVENOUS
  Filled 2017-03-19: qty 10

## 2017-03-19 MED ORDER — HEPARIN SOD (PORK) LOCK FLUSH 100 UNIT/ML IV SOLN
500.0000 [IU] | Freq: Once | INTRAVENOUS | Status: AC
  Administered 2017-03-19: 500 [IU] via INTRAVENOUS
  Filled 2017-03-19: qty 5

## 2017-03-19 MED ORDER — FOSAPREPITANT DIMEGLUMINE INJECTION 150 MG
Freq: Once | INTRAVENOUS | Status: AC
  Administered 2017-03-19: 11:00:00 via INTRAVENOUS
  Filled 2017-03-19: qty 5

## 2017-03-19 MED ORDER — SODIUM CHLORIDE 0.9 % IV SOLN
200.0000 mg | Freq: Once | INTRAVENOUS | Status: AC
  Administered 2017-03-19: 200 mg via INTRAVENOUS
  Filled 2017-03-19: qty 8

## 2017-03-19 MED ORDER — SODIUM CHLORIDE 0.9 % IV SOLN
230.0000 mg | Freq: Once | INTRAVENOUS | Status: AC
  Administered 2017-03-19: 230 mg via INTRAVENOUS
  Filled 2017-03-19: qty 23

## 2017-03-19 MED ORDER — LIDOCAINE-PRILOCAINE 2.5-2.5 % EX CREA: 1.0000 "application " | TOPICAL_CREAM | CUTANEOUS | 3 refills | Status: DC | PRN

## 2017-03-19 MED ORDER — ONDANSETRON HCL 8 MG PO TABS: 8.0000 mg | ORAL_TABLET | Freq: Two times a day (BID) | ORAL | 1 refills | Status: AC | PRN

## 2017-03-19 MED ORDER — LORAZEPAM 0.5 MG PO TABS: 0.5000 mg | ORAL_TABLET | Freq: Four times a day (QID) | ORAL | 0 refills | Status: DC | PRN

## 2017-03-19 MED ORDER — PACLITAXEL PROTEIN-BOUND CHEMO INJECTION 100 MG: 100.0000 mg/m2 | Freq: Once | Status: DC

## 2017-03-19 MED ORDER — PACLITAXEL PROTEIN-BOUND CHEMO INJECTION 100 MG
100.0000 mg/m2 | Freq: Once | INTRAVENOUS | Status: AC
  Administered 2017-03-19: 150 mg via INTRAVENOUS
  Filled 2017-03-19: qty 30

## 2017-03-19 NOTE — Progress Notes (Signed)
Pt in today for follow up.  Denies any difficulties or concerns.  Request prescription for Emla cream for port a cath site.

## 2017-03-20 ENCOUNTER — Telehealth: Payer: Self-pay | Admitting: *Deleted

## 2017-03-20 ENCOUNTER — Other Ambulatory Visit: Payer: Self-pay | Admitting: Urgent Care

## 2017-03-20 MED ORDER — CIPROFLOXACIN HCL 500 MG PO TABS: 500.0000 mg | ORAL_TABLET | Freq: Two times a day (BID) | ORAL | 0 refills | Status: AC

## 2017-03-20 NOTE — Telephone Encounter (Signed)
Called patient to inform her that she has a UTI. Patient can take Cipro.  Have called prescription to pharmacy.

## 2017-03-21 LAB — URINE CULTURE

## 2017-03-23 ENCOUNTER — Other Ambulatory Visit: Payer: Self-pay | Admitting: *Deleted

## 2017-03-23 ENCOUNTER — Other Ambulatory Visit: Payer: Self-pay | Admitting: Urgent Care

## 2017-03-23 ENCOUNTER — Telehealth: Payer: Self-pay | Admitting: *Deleted

## 2017-03-23 DIAGNOSIS — C3432 Malignant neoplasm of lower lobe, left bronchus or lung: Secondary | ICD-10-CM

## 2017-03-23 MED ORDER — FOSFOMYCIN TROMETHAMINE 3 G PO PACK: 3.0000 g | PACK | Freq: Once | ORAL | 0 refills | Status: AC

## 2017-03-23 NOTE — Telephone Encounter (Signed)
Called patient to stop Cipro that was called Friday and pick up new prescription for fosfomycin which is a one time medication.  Patient verbalized understanding.

## 2017-03-25 ENCOUNTER — Encounter: Payer: Self-pay | Admitting: Hematology and Oncology

## 2017-03-25 ENCOUNTER — Other Ambulatory Visit: Payer: Self-pay | Admitting: Hematology and Oncology

## 2017-03-26 ENCOUNTER — Inpatient Hospital Stay (HOSPITAL_BASED_OUTPATIENT_CLINIC_OR_DEPARTMENT_OTHER): Payer: Medicare HMO | Admitting: Urgent Care

## 2017-03-26 ENCOUNTER — Inpatient Hospital Stay: Payer: Medicare HMO

## 2017-03-26 VITALS — BP 102/61 | HR 98 | Temp 97.1°F | Resp 18 | Wt 114.0 lb

## 2017-03-26 DIAGNOSIS — M797 Fibromyalgia: Secondary | ICD-10-CM

## 2017-03-26 DIAGNOSIS — N3000 Acute cystitis without hematuria: Secondary | ICD-10-CM

## 2017-03-26 DIAGNOSIS — Z170 Estrogen receptor positive status [ER+]: Secondary | ICD-10-CM

## 2017-03-26 DIAGNOSIS — I700 Atherosclerosis of aorta: Secondary | ICD-10-CM

## 2017-03-26 DIAGNOSIS — C3432 Malignant neoplasm of lower lobe, left bronchus or lung: Secondary | ICD-10-CM

## 2017-03-26 DIAGNOSIS — N390 Urinary tract infection, site not specified: Secondary | ICD-10-CM | POA: Diagnosis not present

## 2017-03-26 DIAGNOSIS — J449 Chronic obstructive pulmonary disease, unspecified: Secondary | ICD-10-CM

## 2017-03-26 DIAGNOSIS — M069 Rheumatoid arthritis, unspecified: Secondary | ICD-10-CM

## 2017-03-26 DIAGNOSIS — R5383 Other fatigue: Secondary | ICD-10-CM

## 2017-03-26 DIAGNOSIS — J90 Pleural effusion, not elsewhere classified: Secondary | ICD-10-CM

## 2017-03-26 DIAGNOSIS — R197 Diarrhea, unspecified: Secondary | ICD-10-CM

## 2017-03-26 DIAGNOSIS — Z9223 Personal history of estrogen therapy: Secondary | ICD-10-CM

## 2017-03-26 DIAGNOSIS — D649 Anemia, unspecified: Secondary | ICD-10-CM

## 2017-03-26 DIAGNOSIS — I110 Hypertensive heart disease with heart failure: Secondary | ICD-10-CM

## 2017-03-26 DIAGNOSIS — Z79899 Other long term (current) drug therapy: Secondary | ICD-10-CM

## 2017-03-26 DIAGNOSIS — E871 Hypo-osmolality and hyponatremia: Secondary | ICD-10-CM

## 2017-03-26 DIAGNOSIS — Z9221 Personal history of antineoplastic chemotherapy: Secondary | ICD-10-CM

## 2017-03-26 DIAGNOSIS — Z923 Personal history of irradiation: Secondary | ICD-10-CM

## 2017-03-26 DIAGNOSIS — R0602 Shortness of breath: Secondary | ICD-10-CM

## 2017-03-26 DIAGNOSIS — Z9981 Dependence on supplemental oxygen: Secondary | ICD-10-CM

## 2017-03-26 DIAGNOSIS — Z853 Personal history of malignant neoplasm of breast: Secondary | ICD-10-CM

## 2017-03-26 DIAGNOSIS — I509 Heart failure, unspecified: Secondary | ICD-10-CM

## 2017-03-26 DIAGNOSIS — Z792 Long term (current) use of antibiotics: Secondary | ICD-10-CM

## 2017-03-26 DIAGNOSIS — M899 Disorder of bone, unspecified: Secondary | ICD-10-CM

## 2017-03-26 DIAGNOSIS — F17210 Nicotine dependence, cigarettes, uncomplicated: Secondary | ICD-10-CM

## 2017-03-26 LAB — URINALYSIS, COMPLETE (UACMP) WITH MICROSCOPIC
Bilirubin Urine: NEGATIVE
Glucose, UA: NEGATIVE mg/dL
Hgb urine dipstick: NEGATIVE
Ketones, ur: NEGATIVE mg/dL
Nitrite: NEGATIVE
Protein, ur: NEGATIVE mg/dL
RBC / HPF: NONE SEEN RBC/hpf (ref 0–5)
Specific Gravity, Urine: 1.006 (ref 1.005–1.030)
pH: 6 (ref 5.0–8.0)

## 2017-03-26 LAB — CBC WITH DIFFERENTIAL/PLATELET
Basophils Absolute: 0.1 10*3/uL (ref 0–0.1)
Basophils Relative: 1 %
Eosinophils Absolute: 0.5 10*3/uL (ref 0–0.7)
Eosinophils Relative: 10 %
HCT: 35.9 % (ref 35.0–47.0)
Hemoglobin: 12.3 g/dL (ref 12.0–16.0)
Lymphocytes Relative: 18 %
Lymphs Abs: 0.9 10*3/uL — ABNORMAL LOW (ref 1.0–3.6)
MCH: 31.4 pg (ref 26.0–34.0)
MCHC: 34.3 g/dL (ref 32.0–36.0)
MCV: 91.4 fL (ref 80.0–100.0)
Monocytes Absolute: 0.1 10*3/uL — ABNORMAL LOW (ref 0.2–0.9)
Monocytes Relative: 2 %
Neutro Abs: 3.4 10*3/uL (ref 1.4–6.5)
Neutrophils Relative %: 69 %
Platelets: 123 10*3/uL — ABNORMAL LOW (ref 150–440)
RBC: 3.92 MIL/uL (ref 3.80–5.20)
RDW: 16 % — ABNORMAL HIGH (ref 11.5–14.5)
WBC: 5 10*3/uL (ref 3.6–11.0)

## 2017-03-26 LAB — COMPREHENSIVE METABOLIC PANEL
ALT: 19 U/L (ref 14–54)
AST: 26 U/L (ref 15–41)
Albumin: 3.5 g/dL (ref 3.5–5.0)
Alkaline Phosphatase: 82 U/L (ref 38–126)
Anion gap: 8 (ref 5–15)
BUN: 15 mg/dL (ref 6–20)
CO2: 26 mmol/L (ref 22–32)
Calcium: 8.9 mg/dL (ref 8.9–10.3)
Chloride: 98 mmol/L — ABNORMAL LOW (ref 101–111)
Creatinine, Ser: 0.93 mg/dL (ref 0.44–1.00)
GFR calc Af Amer: >60 mL/min (ref >60)
GFR calc non Af Amer: 59 mL/min — ABNORMAL LOW (ref >60)
Glucose, Bld: 107 mg/dL — ABNORMAL HIGH (ref 65–99)
Potassium: 4 mmol/L (ref 3.5–5.1)
Sodium: 132 mmol/L — ABNORMAL LOW (ref 135–145)
Total Bilirubin: 0.5 mg/dL (ref 0.3–1.2)
Total Protein: 6.9 g/dL (ref 6.5–8.1)

## 2017-03-26 MED ORDER — HEPARIN SOD (PORK) LOCK FLUSH 100 UNIT/ML IV SOLN
500.0000 [IU] | Freq: Once | INTRAVENOUS | Status: AC | PRN
  Administered 2017-03-26: 500 [IU]
  Filled 2017-03-26: qty 5

## 2017-03-26 MED ORDER — PACLITAXEL PROTEIN-BOUND CHEMO INJECTION 100 MG
100.0000 mg/m2 | Freq: Once | INTRAVENOUS | Status: AC
  Administered 2017-03-26: 150 mg via INTRAVENOUS
  Filled 2017-03-26: qty 30

## 2017-03-26 MED ORDER — SODIUM CHLORIDE 0.9 % IV SOLN
Freq: Once | INTRAVENOUS | Status: AC
  Administered 2017-03-26: 10:00:00 via INTRAVENOUS
  Filled 2017-03-26: qty 1000

## 2017-03-26 MED ORDER — ONDANSETRON HCL 4 MG PO TABS
8.0000 mg | ORAL_TABLET | Freq: Once | ORAL | Status: AC
  Administered 2017-03-26: 8 mg via ORAL
  Filled 2017-03-26: qty 2

## 2017-03-26 NOTE — Progress Notes (Signed)
Patient is very unsteady on her feet today.  States she does not feel good - feels weak.  States her appetite is decreased - 25%.  Has had diarrhea for past 3 days.  Took Lomotil.  UTI resolved.

## 2017-03-26 NOTE — Progress Notes (Signed)
[facility] day:  03/26/2017    Chief Complaint: [name] [name] is a 77 y.o. female with presumed metastatic squamous cell carcinoma of the left lower lobe who is seen for assessment prior to day 8 of cycle #1 Abraxane.  HPI: The patient was last seen in the medical oncology clinic on 03/19/2017.  At that time,  patient complained of irritation related to a newly placed port.  She did not use EMLA cream prior to arrival.  Patient also complained of urinary frequency and intermittent dysuria.  She stated, "I keep a UTI".  UA and culture were sent for testing.  Exam revealed multiple areas of bruising to her bilateral upper extremities.  Patient stated, "I call myself at night because of my fibromyalgia".  Decreased breath sounds noted in the LLL.  WBC 5.9 with an [place] of 3800.  Sodium low at 131.  BUN 18 with a creatinine of 1.20.  Patient received day #1 of cycle #1 carboplatin, Abraxane, and pembrolizumab.  UA positive for infection.  Culture grew out >100,000 CFU/mL of an ESBL producing E. coli.  Patient initially started on Cipro 500 twice daily times 10 days, however after culture and sensitivity report reviewed, she was switched to Fosfomycin 3 g x1 dose.  Symptomatically, patient doing well overall.  She noted some mild diarrhea following her antibiotics.  She notes full resolution of her previously reported urinary symptoms.  Patient denies fevers, sweats, and significant weight loss.  She is eating "okay".  Her weight is up 1 pound since her last visit.  Patient denies any increased shortness of breath.  She is becoming more accustomed to having her port and place, and notes that is not bothering her as much.  Patient continues to have neuropathy symptoms in the 3rd and 4th digit of her RIGHT hand. She notes that this is stable.   Patient denies pain today in the clinic.  Past Medical History:  Diagnosis Date  . Asthma   . Breast cancer ([place]) 2009    left  . Cancer ([place])   . CHF (congestive heart failure) ([place])   . CHF (congestive heart failure) ([place])   . Chronic UTI   . COPD (chronic obstructive pulmonary disease) ([place])   . Dizziness   . Fibromyalgia   . Hypertension   . Neuropathy   . Personal history of tobacco use, presenting hazards to health 05/17/2015  . Polyp, larynx   . RA (rheumatoid arthritis) ([place])   . Sinus infection    recent  . Stumbling gait    to the left  . Supplemental oxygen dependent    2.5l    Past Surgical History:  Procedure Laterality Date  . ABDOMINAL HYSTERECTOMY    . BREAST LUMPECTOMY Left 2009   chemo and radiation  . CYST EXCISION Left 02/27/2015   Procedure: CYST REMOVAL;  Surgeon: Hessie Knows, MD;  Location: [facility] OR[facility];  Service: Orthopedics;  Laterality: Left;  . EYE MUSCLE SURGERY Right    13 surgeries  . FLEXIBLE BRONCHOSCOPY N/A 07/01/2016   Procedure: FLEXIBLE BRONCHOSCOPY;  Surgeon: Wilhelmina Mcardle, MD;  Location: [facility] OR[facility];  Service: Pulmonary;  Laterality: N/A;  . FLEXIBLE BRONCHOSCOPY N/A 02/09/2017   Procedure: FLEXIBLE BRONCHOSCOPY;  Surgeon: [name], MD;  Location: [facility];  Service: Pulmonary;  Laterality: N/A;  . PORTA CATH INSERTION N/A 03/04/2017   Procedure: PORTA CATH INSERTION;  Surgeon: [name], MD;  Location: [facility];  Service: Cardiovascular;  Laterality: N/A;  . THUMB ARTHROSCOPY Left     Family History  Problem Relation Age of Onset  . Diabetes Father   . Stroke Father   . Heart attack Father   . CAD Sister     Social History:  reports that she has been smoking cigarettes.  She has a 40.00 pack-year smoking history. she has never used smokeless tobacco. She reports that she does not drink alcohol or use drugs.  He lives with her son and grandson. The patient is alone today.   Allergies:  Allergies  Allergen Reactions  . Contrast Media [Iodinated Diagnostic Agents] Other (See Comments)    Pt was sent to the ED following  contrast media injection at [place]. Unknown reason. She has been premedicated since without complications. Pt to be premedicated prior to contrast media injections  . Ioxaglate Other (See Comments)    Pt was sent to the ED following contrast media injection at [place]. Unknown reason. She has been premedicated since without complications. Pt to be premedicated prior to contrast media injections  . Sulfa Antibiotics Other (See Comments)    Unknown  . Tetracycline Hives  . White Petrolatum Other (See Comments)    Blisters  . Amoxicillin-Pot Clavulanate Rash and Other (See Comments)    Blisters in mouth Has patient had a PCN reaction causing immediate rash, facial/tongue/throat swelling, SOB or lightheadedness with hypotension: No Has patient had a PCN reaction causing severe rash involving mucus membranes or skin necrosis: No Has patient had a PCN reaction that required hospitalization: No Has patient had a PCN reaction occurring within the last 10 years: No If all of the above answers are "NO", then may proceed with Cephalosporin use.   . Tape Rash    Current Medications: Current Outpatient Medications  Medication Sig Dispense Refill  . albuterol (PROVENTIL HFA;VENTOLIN HFA) 108 (90 Base) MCG/ACT inhaler Inhale 1-2 puffs into the lungs every 6 (six) hours as needed for wheezing or shortness of breath. 1 Inhaler 2  . Calcium-Vitamin D 600-200 MG-UNIT tablet Take 1 tablet by mouth daily.     . carvedilol (COREG) 12.5 MG tablet Take 1 tablet (12.5 mg total) by mouth 2 (two) times daily with a meal. 60 tablet 0  . cholecalciferol (VITAMIN D) 1000 units tablet Take 1,000 Units by mouth daily.    . clonazePAM (KLONOPIN) 1 MG tablet Take 1 mg by mouth 2 (two) times daily as needed for anxiety.     [name] dexamethasone (DECADRON) 4 MG tablet Take 2 tablets by mouth once a day on the day after chemotherapy and then take 2 tablets two times a day for 2 days. Take with food. 30  tablet 1  . gabapentin (NEURONTIN) 600 MG tablet Take 600 mg by mouth 3 (three) times daily.     [name] HYDROcodone-acetaminophen (NORCO/VICODIN) 5-325 MG tablet Take 1 tablet by mouth 2 (two) times daily.     [name] imipramine (TOFRANIL) 25 MG tablet Take 25 mg by mouth at bedtime.     . lidocaine-prilocaine (EMLA) cream Apply 1 application topically as needed. 30 g 3  . LORazepam (ATIVAN) 0.5 MG tablet Take 1 tablet (0.5 mg total) by mouth every 6 (six) hours as needed (Nausea or vomiting). 30 tablet 0  . Multiple Vitamin (MULTIVITAMIN) capsule Take 1 capsule by mouth daily.    . nitroGLYCERIN (NITROSTAT) 0.4 MG SL tablet Place 0.4 mg under the tongue every 5 (five) minutes as needed for chest pain.     [name]  ondansetron (ZOFRAN) 8 MG tablet Take 1 tablet (8 mg total) by mouth 2 (two) times daily as needed. Start on the third day after chemotherapy. 30 tablet 1  . Tiotropium Bromide Monohydrate (SPIRIVA RESPIMAT) 2.5 MCG/ACT AERS Inhale 2.5 mcg into the lungs 2 (two) times daily. 1 Inhaler 0  . traMADol (ULTRAM) 50 MG tablet Take 100 mg by mouth daily.     . vitamin C (ASCORBIC ACID) 500 MG tablet Take 500 mg by mouth daily.    [name] ZINC SULFATE PO Take 1 tablet by mouth daily.     [name] zolpidem (AMBIEN) 5 MG tablet Take 5 mg by mouth at bedtime.    . ciprofloxacin (CIPRO) 500 MG tablet Take 1 tablet (500 mg total) by mouth 2 (two) times daily for 10 days. (Patient not taking: Reported on 03/26/2017) 20 tablet 0  . potassium chloride (K-DUR) 10 MEQ tablet Take 1 tablet (10 mEq total) by mouth daily. (Patient not taking: Reported on 02/16/2017) 10 tablet 0  . sucralfate (CARAFATE) 1 g tablet Take 1 tablet (1 g total) by mouth 3 (three) times daily. (Patient not taking: Reported on 03/03/2017) 90 tablet 6   No current facility-administered medications for this visit.     Review of Systems:  GENERAL:  Fatigue.  No fever, chills, or sweats.  No fevers. Weight up 1 pound. PERFORMANCE STATUS (ECOG):  2 HEENT:  No visual  changes, runny nose, sore throat, mouth sores or tenderness. Lungs: Shortness of breath, chronic.  Cough.  No hemoptysis.  On oxygen 2 liter/min via [place]. Cardiac:  No chest pain, palpitations, orthopnea, or PND. GI:   Some diarrhea. No nausea, vomiting, constipation, melena or hematochezia. GU:  No urgency, dysuria, or hematuria.  Musculoskeletal:  Fibromyalgia.  Right sided pain.  Osteoporosis.  Joints ache.   No muscle tenderness. Extremities:  No pain or swelling. Skin:  No rashes or skin changes.  Neuro:  Neuropathy in 3rd and 4th digits of RIGHT hand. No headache, numbness or weakness, balance or coordination issues. Endocrine:  No diabetes, thyroid issues, hot flashes or night sweats. Psych:  No mood changes, depression or anxiety.  Takes Ambien for sleep. Pain:  No focal pain today. Review of systems:  All other systems reviewed and found to be negative.  Physical Exam: Blood pressure 102/61, pulse 98, temperature (!) 97.1 F (36.2 C), temperature source Tympanic, resp. rate 18, weight 114 lb (51.7 kg), SpO2 97 %.  GENERAL:  Thin woman sitting comfortably in the exam room in no acute distress. MENTAL STATUS:  Alert and oriented to person, place and time. HEAD:  Short Deniz Eskridge blonde hair.  Normocephalic, atraumatic, face symmetric, no Cushingoid features. EYES:  Blue eyes.  Pupils equal round and reactive to light and accomodation.  No conjunctivitis or scleral icterus. ENT:  Oropharynx clear without lesion.  Tongue normal.  Mucous membranes moist.  RESPIRATORY:  Decreased breath left base.  No rales, wheezes or rhonchi. CARDIOVASCULAR:  Regular rate and rhythm without murmur, rub or gallop. CHEST WALL:  Port-a-cath site unremarkable. No left sided rib pain on palpation. ABDOMEN:  Soft, non tender with active bowel sounds and no hepatosplenomegaly.  No masses. SKIN:  No rashes, ulcers or lesions. EXTREMITIES: Arthritic changes in hands. Ankle edema.  No skin discoloration or tenderness.   No palpable cords. LYMPH NODES: No palpable cervical, supraclavicular, axillary or inguinal adenopathy  NEUROLOGICAL: Unremarkable. PSYCH:  Appropriate.   Infusion on 03/26/2017  Component Date Value Ref Range Status  .  Sodium 03/26/2017 132* 135 - 145 mmol/L Final  . Potassium 03/26/2017 4.0  3.5 - 5.1 mmol/L Final  . Chloride 03/26/2017 98* 101 - 111 mmol/L Final  . CO2 03/26/2017 26  22 - 32 mmol/L Final  . Glucose, Bld 03/26/2017 107* 65 - 99 mg/dL Final  . BUN 03/26/2017 15  6 - 20 mg/dL Final  . Creatinine, Ser 03/26/2017 0.93  0.44 - 1.00 mg/dL Final  . Calcium 03/26/2017 8.9  8.9 - 10.3 mg/dL Final  . Total Protein 03/26/2017 6.9  6.5 - 8.1 g/dL Final  . Albumin 03/26/2017 3.5  3.5 - 5.0 g/dL Final  . AST 03/26/2017 26  15 - 41 U/L Final  . ALT 03/26/2017 19  14 - 54 U/L Final  . Alkaline Phosphatase 03/26/2017 82  38 - 126 U/L Final  . Total Bilirubin 03/26/2017 0.5  0.3 - 1.2 mg/dL Final  . GFR calc non Af Amer 03/26/2017 59* >60 mL/min Final  . GFR calc Af Amer 03/26/2017 >60  >60 mL/min Final   Comment: (NOTE) The eGFR has been calculated using the CKD EPI equation. This calculation has not been validated in all clinical situations. eGFR's persistently <60 mL/min signify possible Chronic Kidney Disease.   Georgiann Hahn gap 03/26/2017 8  5 - 15 Final   Performed at [facility], [address] [place], [place] [address]  . WBC 03/26/2017 5.0  3.6 - 11.0 K/uL Final  . RBC 03/26/2017 3.92  3.80 - 5.20 MIL/uL Final  . Hemoglobin 03/26/2017 12.3  12.0 - 16.0 g/dL Final  . HCT 03/26/2017 35.9  35.0 - 47.0 % Final  . MCV 03/26/2017 91.4  80.0 - 100.0 fL Final  . MCH 03/26/2017 31.4  26.0 - 34.0 pg Final  . MCHC 03/26/2017 34.3  32.0 - 36.0 g/dL Final  . RDW 03/26/2017 16.0* 11.5 - 14.5 % Final  . Platelets 03/26/2017 123* 150 - 440 K/uL Final  . Neutrophils Relative % 03/26/2017 69  % Final  . Neutro Abs 03/26/2017 3.4  1.4 - 6.5 K/uL Final  . Lymphocytes Relative  03/26/2017 18  % Final  . Lymphs Abs 03/26/2017 0.9* 1.0 - 3.6 K/uL Final  . Monocytes Relative 03/26/2017 2  % Final  . Monocytes Absolute 03/26/2017 0.1* 0.2 - 0.9 K/uL Final  . Eosinophils Relative 03/26/2017 10  % Final  . Eosinophils Absolute 03/26/2017 0.5  0 - 0.7 K/uL Final  . Basophils Relative 03/26/2017 1  % Final  . Basophils Absolute 03/26/2017 0.1  0 - 0.1 K/uL Final   Performed at [facility], [address] [place], [place] [address]  . Specimen Description 03/26/2017    Final                   Value:URINE, CLEAN CATCH Performed at [facility], [address], [place], [place] [address]   . Special Requests 03/26/2017    Final                   Value:NONE Performed at [facility], [address], [place], [place] [address]   . Culture 03/26/2017    Final                   Value:NO GROWTH Performed at [facility], [place] [address], [place], [place] [address]   . Report Status 03/26/2017 03/27/2017 FINAL   Final  . Color, Urine 03/26/2017 YELLOW* YELLOW Final  . APPearance  03/26/2017 CLEAR* CLEAR Final  . Specific Gravity, Urine 03/26/2017 1.006  1.005 - 1.030 Final  . pH 03/26/2017 6.0  5.0 - 8.0 Final  . Glucose, UA 03/26/2017 NEGATIVE  NEGATIVE mg/dL Final  . Hgb urine dipstick 03/26/2017 NEGATIVE  NEGATIVE Final  . Bilirubin Urine 03/26/2017 NEGATIVE  NEGATIVE Final  . Ketones, ur 03/26/2017 NEGATIVE  NEGATIVE mg/dL Final  . Protein, ur 03/26/2017 NEGATIVE  NEGATIVE mg/dL Final  . Nitrite 03/26/2017 NEGATIVE  NEGATIVE Final  . Leukocytes, UA 03/26/2017 SMALL* NEGATIVE Final  . RBC / HPF 03/26/2017 NONE SEEN  0 - 5 RBC/hpf Final  . WBC, UA 03/26/2017 0-5  0 - 5 WBC/hpf Final  . Bacteria, UA 03/26/2017 RARE* NONE SEEN Final  . Squamous Epithelial / LPF 03/26/2017 0-5* NONE SEEN Final  . Mucus 03/26/2017 PRESENT   Final   Performed at [facility], [address], [place], [place] [address]    Assessment:  [name] [name]  is a 77 y.o. female with presumed metastatic squamous cell lung cancer.  She presented with clinical [id number] squamous cell lung cancer of the left lung s/p bronchoscopy and biopsy on 07/01/2016.  She has a 40 pack year smoking history.  She presented with left lower chest wall pain.  Foundation One testing on 03/13/2017 revealed no reportable alterations in EGFR, RET, ALK, MET, ERBB2, BRAF, and ROS1.    Tumor was MS-stable.  TMB was 3 Muts/Mb.  There was CDKN2A loss, CDKN2B loss, KRAS G12V, MTAP loss, and TP53 R282W.  PD-L1 was 5%.  Chest CT with contrast on 06/11/2016 revealed a 3.2 x 3.0 mass like area of focal opacity in the medial left lower lobe with obliteration of segmental airways to the anterior left lower lobe.  PET scan on 06/27/2016 revealed a 4.3 x 3.2 cm central left lower lobe lung lesion (SUV 14.3) consistent with primary bronchogenic carcinoma.  There was equivocal nodal tissue in the subcarinal station demonstrating mild hypermetabolism (SUV 3.6). There was more peripheral left lower lobe increased atelectasis with mucoid impaction which is likely secondary to endobronchial obstruction.  There was a small left pleural effusion.  Head MRI on 07/07/2016 revealed no evidence of metastatic disease.  She received radiation from 07/22/2016 - 09/19/2016.  She received 5 weeks of concurrent carboplatin and Taxol (07/24/2016 - 08/11/2016; 09/05/2016 - 09/16/2016). She requires a reduced dose of Benadryl (25 mg) for her premedication.  Week #3 was postponed secondary to chest pain and evaluation.  She missed a couple of weeks. She began carboplatin + Abraxane + pembrolizumab on 03/19/2017.  Chest CT on 11/21/2016 revealed progressive collapse of the LEFT lower lobe presumably related to central obstructing mass.  Mass obstructed the LEFT lower lobe bronchus, although mass was not well demonstrated.  There were small effusions (no change).  There was centrilobular emphysema unchanged.  There was  no evidence pneumonia.  PET scan on 01/27/2017 revealed interval response to therapy.  The previously noted hypermetabolism associated with left lower lobe perihilar lung mass has resolved in the interval.  There was a moderate left pleural effusion that had increased in volume from previous PET-CT and there was now complete atelectasis/consolidation of the left lower lobe, which may obscure residual mass.  There was no new sites of hypermetabolism or evidence of distant metastatic disease.  Bronchoscopy on 02/09/2017 revealed no evidence of endobronchial tumor.  There was stenosis at the entry to the left lower posterior segments which could not be entered.  There was mildly  erythematous mucosa with copious secretions in the left lower lobe.  Brushings were positive for non-small cell carcinoma, favor squamous cell carcinoma.  Ultrasound guided thoracentesis on 02/17/2017 revealed was negative for malignancy.  Bone scan on 02/27/2017 revealed a subtle focus of uptake in the left 7th rib, indeterminate.  She has a normocytic anemia likely due to chemotherapy.  Work-up on 07/24/2016 revealed the following normal labs: ferritin (103), B12 (345), folate(19.7).  Iron saturation was 8% and TIBC was 258.  She has a history of stage IA left breast cancer in 02/2005.  She underwent lumpectomy.  Pathology revealed a T1cN0 lesion.  Tumor was ER + , PR +, and Her2/neu -.  Two sentinel lymph nodes were negative.  She received chemotherapy (4 cycles of AC and possibly an abbreviated course of Taxol- no records available).  She received radiation.  She completed Femara in 08/2010.  She has fibromyalgia.  She has advanced thoracoabdominal aortic atherosclerosis with fusiform dilatation of the descending thoracic aorta up to 4.3 cm.  She is followed by Dr Lucky Cowboy.  Symptomatically, patient is doing well overall. She has a chronic cough and shortness of breath  Her UTI symptoms have resolved following treatment with  antibiotics.  Breath sounds are decreased in LLL.  WBC 5.0 with an [place] of 3400.  Hemoglobin 12.3, hematocrit 35.9, and platelets 123,000.  Sodium continues to be low at 132.  BUN 53 with a creatinine of 0.93 (CrCl 42 mL/min).   Plan: 1.  Labs today:  CBC with diff, CMP 2.  Review plan for carboplatin + Abraxane + pembrolizumab (Keytruda) every 3 weeks x 4 cycles followed by pembrolizumab every 3 weeks.  Cycles #1-4, treatment on day 1, 8, and 15.  Day 1 consists of carboplatin + Abraxane + pembrolizumab.  Day 8 and 15 consists of Abraxane alone.  Side effects reviewed.   3.  Labs reviewed. Blood counts stable and adequate enough for treatment. Will proceed with day 8 of cycle #1 Abraxane today as planned.  4.  Discuss UTI. Symptoms fully resolved. Treatment successful.  5.  Discuss loose stools. May use loperamide PRN. Encouraged to increase fluid intake. 6.  Discuss HYPOnatremia. Sodium 132. Encouraged ORT using Pedialyte or Gatorade. 7.  Discuss symptom management. Patient has pain medications and antiemetics at home for PRN use.  8.  Discuss weight. Encouraged protein and calorie food choices. Patient encouraged to supplement her diet with shakes as tolerated.  9.  RTC as already scheduled. Please make sure that patient is not coming in at 0800. She is requesting a 0930 - 1000 slot.   ADDENDUM: Repeat UA demonstrates resolution urinary tract infection previously treated with Fosfomycin 3 grams x1 dose. Sample positive for a small number of leukocytes however negative for red cells and nitrites.  CNS reviewed and there was no evidence of pathogen growth.  Honor Loh, NP  03/26/2017, 7:23 PM

## 2017-03-27 LAB — URINE CULTURE: Culture: NO GROWTH

## 2017-03-28 DIAGNOSIS — R197 Diarrhea, unspecified: Secondary | ICD-10-CM | POA: Insufficient documentation

## 2017-04-01 ENCOUNTER — Encounter: Payer: Self-pay | Admitting: Hematology and Oncology

## 2017-04-02 ENCOUNTER — Inpatient Hospital Stay: Payer: Medicare HMO

## 2017-04-02 ENCOUNTER — Inpatient Hospital Stay (HOSPITAL_BASED_OUTPATIENT_CLINIC_OR_DEPARTMENT_OTHER): Payer: Medicare HMO | Admitting: Hematology and Oncology

## 2017-04-02 VITALS — BP 148/75 | HR 89 | Temp 97.9°F | Resp 16 | Wt 117.0 lb

## 2017-04-02 DIAGNOSIS — Z79899 Other long term (current) drug therapy: Secondary | ICD-10-CM

## 2017-04-02 DIAGNOSIS — C3432 Malignant neoplasm of lower lobe, left bronchus or lung: Secondary | ICD-10-CM

## 2017-04-02 DIAGNOSIS — F17210 Nicotine dependence, cigarettes, uncomplicated: Secondary | ICD-10-CM

## 2017-04-02 DIAGNOSIS — Z170 Estrogen receptor positive status [ER+]: Secondary | ICD-10-CM

## 2017-04-02 DIAGNOSIS — R5383 Other fatigue: Secondary | ICD-10-CM | POA: Diagnosis not present

## 2017-04-02 DIAGNOSIS — R197 Diarrhea, unspecified: Secondary | ICD-10-CM

## 2017-04-02 DIAGNOSIS — D649 Anemia, unspecified: Secondary | ICD-10-CM | POA: Diagnosis not present

## 2017-04-02 DIAGNOSIS — Z923 Personal history of irradiation: Secondary | ICD-10-CM

## 2017-04-02 DIAGNOSIS — D701 Agranulocytosis secondary to cancer chemotherapy: Secondary | ICD-10-CM

## 2017-04-02 DIAGNOSIS — T451X5S Adverse effect of antineoplastic and immunosuppressive drugs, sequela: Secondary | ICD-10-CM | POA: Diagnosis not present

## 2017-04-02 DIAGNOSIS — Z9981 Dependence on supplemental oxygen: Secondary | ICD-10-CM

## 2017-04-02 DIAGNOSIS — I509 Heart failure, unspecified: Secondary | ICD-10-CM

## 2017-04-02 DIAGNOSIS — R110 Nausea: Secondary | ICD-10-CM | POA: Diagnosis not present

## 2017-04-02 DIAGNOSIS — J449 Chronic obstructive pulmonary disease, unspecified: Secondary | ICD-10-CM

## 2017-04-02 DIAGNOSIS — N390 Urinary tract infection, site not specified: Secondary | ICD-10-CM | POA: Diagnosis not present

## 2017-04-02 DIAGNOSIS — G620 Drug-induced polyneuropathy: Secondary | ICD-10-CM

## 2017-04-02 DIAGNOSIS — Z9223 Personal history of estrogen therapy: Secondary | ICD-10-CM

## 2017-04-02 DIAGNOSIS — Z87440 Personal history of urinary (tract) infections: Secondary | ICD-10-CM

## 2017-04-02 DIAGNOSIS — J90 Pleural effusion, not elsewhere classified: Secondary | ICD-10-CM | POA: Diagnosis not present

## 2017-04-02 DIAGNOSIS — Z792 Long term (current) use of antibiotics: Secondary | ICD-10-CM

## 2017-04-02 DIAGNOSIS — I110 Hypertensive heart disease with heart failure: Secondary | ICD-10-CM

## 2017-04-02 DIAGNOSIS — E871 Hypo-osmolality and hyponatremia: Secondary | ICD-10-CM | POA: Diagnosis not present

## 2017-04-02 DIAGNOSIS — T451X5A Adverse effect of antineoplastic and immunosuppressive drugs, initial encounter: Secondary | ICD-10-CM

## 2017-04-02 DIAGNOSIS — Z9221 Personal history of antineoplastic chemotherapy: Secondary | ICD-10-CM

## 2017-04-02 DIAGNOSIS — Z853 Personal history of malignant neoplasm of breast: Secondary | ICD-10-CM

## 2017-04-02 DIAGNOSIS — I700 Atherosclerosis of aorta: Secondary | ICD-10-CM

## 2017-04-02 DIAGNOSIS — Z95828 Presence of other vascular implants and grafts: Secondary | ICD-10-CM

## 2017-04-02 DIAGNOSIS — M797 Fibromyalgia: Secondary | ICD-10-CM

## 2017-04-02 DIAGNOSIS — Z7189 Other specified counseling: Secondary | ICD-10-CM

## 2017-04-02 DIAGNOSIS — M069 Rheumatoid arthritis, unspecified: Secondary | ICD-10-CM

## 2017-04-02 LAB — CBC WITH DIFFERENTIAL/PLATELET
Basophils Absolute: 0.1 10*3/uL (ref 0–0.1)
Basophils Relative: 2 %
Eosinophils Absolute: 0.1 10*3/uL (ref 0–0.7)
Eosinophils Relative: 5 %
HCT: 32.3 % — ABNORMAL LOW (ref 35.0–47.0)
Hemoglobin: 11 g/dL — ABNORMAL LOW (ref 12.0–16.0)
Lymphocytes Relative: 33 %
Lymphs Abs: 0.8 10*3/uL — ABNORMAL LOW (ref 1.0–3.6)
MCH: 31.1 pg (ref 26.0–34.0)
MCHC: 34.1 g/dL (ref 32.0–36.0)
MCV: 91.2 fL (ref 80.0–100.0)
Monocytes Absolute: 0.3 10*3/uL (ref 0.2–0.9)
Monocytes Relative: 14 %
Neutro Abs: 1 10*3/uL — ABNORMAL LOW (ref 1.4–6.5)
Neutrophils Relative %: 46 %
Platelets: 155 10*3/uL (ref 150–440)
RBC: 3.54 MIL/uL — ABNORMAL LOW (ref 3.80–5.20)
RDW: 15 % — ABNORMAL HIGH (ref 11.5–14.5)
WBC: 2.3 10*3/uL — ABNORMAL LOW (ref 3.6–11.0)

## 2017-04-02 LAB — COMPREHENSIVE METABOLIC PANEL
ALT: 23 U/L (ref 14–54)
AST: 28 U/L (ref 15–41)
Albumin: 3.5 g/dL (ref 3.5–5.0)
Alkaline Phosphatase: 79 U/L (ref 38–126)
Anion gap: 8 (ref 5–15)
BUN: 22 mg/dL — ABNORMAL HIGH (ref 6–20)
CO2: 24 mmol/L (ref 22–32)
Calcium: 8.3 mg/dL — ABNORMAL LOW (ref 8.9–10.3)
Chloride: 102 mmol/L (ref 101–111)
Creatinine, Ser: 1.12 mg/dL — ABNORMAL HIGH (ref 0.44–1.00)
GFR calc Af Amer: 55 mL/min — ABNORMAL LOW (ref >60)
GFR calc non Af Amer: 47 mL/min — ABNORMAL LOW (ref >60)
Glucose, Bld: 106 mg/dL — ABNORMAL HIGH (ref 65–99)
Potassium: 4.2 mmol/L (ref 3.5–5.1)
Sodium: 134 mmol/L — ABNORMAL LOW (ref 135–145)
Total Bilirubin: 0.3 mg/dL (ref 0.3–1.2)
Total Protein: 6.8 g/dL (ref 6.5–8.1)

## 2017-04-02 LAB — MAGNESIUM: Magnesium: 1.9 mg/dL (ref 1.7–2.4)

## 2017-04-02 MED ORDER — HEPARIN SOD (PORK) LOCK FLUSH 100 UNIT/ML IV SOLN
INTRAVENOUS | Status: AC
  Filled 2017-04-02: qty 5

## 2017-04-02 MED ORDER — HEPARIN SOD (PORK) LOCK FLUSH 100 UNIT/ML IV SOLN
500.0000 [IU] | Freq: Once | INTRAVENOUS | Status: AC
  Administered 2017-04-02: 500 [IU] via INTRAVENOUS

## 2017-04-02 NOTE — Progress Notes (Signed)
[facility] day:  04/02/2017    Chief Complaint: [name] [name] is a 77 y.o. female with presumed metastatic squamous cell carcinoma of the left lower lobe who is seen for assessment prior to day 15 of cycle #1 carboplatin, Abraxane, and pembrolizumab.  HPI: The patient was last seen in the medical oncology clinic on 03/26/2017 by Honor Loh, NP.  At that time, she was doing well overall.  She had a chronic cough and shortness of breath  UTI symptoms had resolved following treatment with fosfomycin.  She had some loose stools.  Breath sounds were decreased in LLL.  WBC was 5000 with an [place] of 3400.  Hemoglobin was 12.3, hematocrit 35.9, and platelets 123,000.  Sodium was 132.  BUN was 15 with a creatinine of 0.93 (CrCl 42 mL/min). She received day 8 Abraxane.  Patient presents to the clinic today with complains of chronic short of breath. She is on 3L/[place]. Patient advising that her "oxygen machine broke last night". Patient has been without oxygen since yesterday afternoon. Patient placed on supplemental oxygen in the clinic today.   Patient complains of scalp tenderness. Her hair is starting to come out. Neuropathy in hands is stable. Patient has intermittent nausea. She states, "I have to take a nausea pill sometimes when my family is cooking".    Patient denies B symptoms. She has no recurrent urinary symptoms. Patient has developed no further infections since she was treated with antibiotics. Patient is eating well. She states, "I am eating everything that is not nailed down". Weight has increased 3 pounds.   Patient denies pain in the clinic today.    Past Medical History:  Diagnosis Date  . Asthma   . Breast cancer ([place]) 2009   left  . Cancer ([place])   . CHF (congestive heart failure) ([place])   . CHF (congestive heart failure) ([place])   . Chronic UTI   . COPD (chronic obstructive pulmonary disease) ([place])   . Dizziness   . Fibromyalgia   .  Hypertension   . Neuropathy   . Personal history of tobacco use, presenting hazards to health 05/17/2015  . Polyp, larynx   . RA (rheumatoid arthritis) ([place])   . Sinus infection    recent  . Stumbling gait    to the left  . Supplemental oxygen dependent    2.5l    Past Surgical History:  Procedure Laterality Date  . ABDOMINAL HYSTERECTOMY    . BREAST LUMPECTOMY Left 2009   chemo and radiation  . CYST EXCISION Left 02/27/2015   Procedure: CYST REMOVAL;  Surgeon: Hessie Knows, MD;  Location: [facility] ORS;  Service: Orthopedics;  Laterality: Left;  . EYE MUSCLE SURGERY Right    13 surgeries  . FLEXIBLE BRONCHOSCOPY N/A 07/01/2016   Procedure: FLEXIBLE BRONCHOSCOPY;  Surgeon: Wilhelmina Mcardle, MD;  Location: [facility] ORS;  Service: Pulmonary;  Laterality: N/A;  . FLEXIBLE BRONCHOSCOPY N/A 02/09/2017   Procedure: FLEXIBLE BRONCHOSCOPY;  Surgeon: [name], MD;  Location: [facility] ORS;  Service: Pulmonary;  Laterality: N/A;  . PORTA CATH INSERTION N/A 03/04/2017   Procedure: PORTA CATH INSERTION;  Surgeon: [name], MD;  Location: [facility];  Service: Cardiovascular;  Laterality: N/A;  . THUMB ARTHROSCOPY Left     Family History  Problem Relation Age of Onset  . Diabetes Father   . Stroke Father   . Heart attack Father   . CAD Sister  Social History:  reports that she has been smoking cigarettes.  She has a 40.00 pack-year smoking history. She has never used smokeless tobacco. She reports that she does not drink alcohol or use drugs.  He lives with her son and grandson. The patient is alone today.   Allergies:  Allergies  Allergen Reactions  . Contrast Media [Iodinated Diagnostic Agents] Other (See Comments)    Pt was sent to the ED following contrast media injection at [place]. Unknown reason. She has been premedicated since without complications. Pt to be premedicated prior to contrast media injections  . Ioxaglate Other (See Comments)    Pt was sent  to the ED following contrast media injection at [place]. Unknown reason. She has been premedicated since without complications. Pt to be premedicated prior to contrast media injections  . Sulfa Antibiotics Other (See Comments)    Unknown  . Tetracycline Hives  . White Petrolatum Other (See Comments)    Blisters  . Amoxicillin-Pot Clavulanate Rash and Other (See Comments)    Blisters in mouth Has patient had a PCN reaction causing immediate rash, facial/tongue/throat swelling, SOB or lightheadedness with hypotension: No Has patient had a PCN reaction causing severe rash involving mucus membranes or skin necrosis: No Has patient had a PCN reaction that required hospitalization: No Has patient had a PCN reaction occurring within the last 10 years: No If all of the above answers are "NO", then may proceed with Cephalosporin use.   . Tape Rash    Current Medications: Current Outpatient Medications  Medication Sig Dispense Refill  . albuterol (PROVENTIL HFA;VENTOLIN HFA) 108 (90 Base) MCG/ACT inhaler Inhale 1-2 puffs into the lungs every 6 (six) hours as needed for wheezing or shortness of breath. 1 Inhaler 2  . Calcium-Vitamin D 600-200 MG-UNIT tablet Take 1 tablet by mouth daily.     . carvedilol (COREG) 12.5 MG tablet Take 1 tablet (12.5 mg total) by mouth 2 (two) times daily with a meal. 60 tablet 0  . cholecalciferol (VITAMIN D) 1000 units tablet Take 1,000 Units by mouth daily.    . clonazePAM (KLONOPIN) 1 MG tablet Take 1 mg by mouth 2 (two) times daily as needed for anxiety.     [name] dexamethasone (DECADRON) 4 MG tablet Take 2 tablets by mouth once a day on the day after chemotherapy and then take 2 tablets two times a day for 2 days. Take with food. 30 tablet 1  . gabapentin (NEURONTIN) 600 MG tablet Take 600 mg by mouth 3 (three) times daily.     [name] HYDROcodone-acetaminophen (NORCO/VICODIN) 5-325 MG tablet Take 1 tablet by mouth 2 (two) times daily.     [name] imipramine (TOFRANIL)  25 MG tablet Take 25 mg by mouth at bedtime.     . lidocaine-prilocaine (EMLA) cream Apply 1 application topically as needed. 30 g 3  . LORazepam (ATIVAN) 0.5 MG tablet Take 1 tablet (0.5 mg total) by mouth every 6 (six) hours as needed (Nausea or vomiting). 30 tablet 0  . Multiple Vitamin (MULTIVITAMIN) capsule Take 1 capsule by mouth daily.    . nitroGLYCERIN (NITROSTAT) 0.4 MG SL tablet Place 0.4 mg under the tongue every 5 (five) minutes as needed for chest pain.     [name] ondansetron (ZOFRAN) 8 MG tablet Take 1 tablet (8 mg total) by mouth 2 (two) times daily as needed. Start on the third day after chemotherapy. 30 tablet 1  . Tiotropium Bromide Monohydrate (SPIRIVA RESPIMAT) 2.5 MCG/ACT AERS Inhale 2.5  mcg into the lungs 2 (two) times daily. 1 Inhaler 0  . traMADol (ULTRAM) 50 MG tablet Take 100 mg by mouth daily.     . vitamin C (ASCORBIC ACID) 500 MG tablet Take 500 mg by mouth daily.    [name] ZINC SULFATE PO Take 1 tablet by mouth daily.     [name] zolpidem (AMBIEN) 5 MG tablet Take 5 mg by mouth at bedtime.    . potassium chloride (K-DUR) 10 MEQ tablet Take 1 tablet (10 mEq total) by mouth daily. (Patient not taking: Reported on 04/02/2017) 10 tablet 0  . sucralfate (CARAFATE) 1 g tablet Take 1 tablet (1 g total) by mouth 3 (three) times daily. (Patient not taking: Reported on 03/03/2017) 90 tablet 6   No current facility-administered medications for this visit.     Review of Systems:  GENERAL:  Feels "ok". No fever, chills, or sweats.  No fevers. Weight up 3 pounds. PERFORMANCE STATUS (ECOG):  2 HEENT:  No visual changes, runny nose, sore throat, mouth sores or tenderness. Lungs: Chronic shortness of breath (see HPI).  Cough.  No hemoptysis.  On oxygen 3 L/[place]. Cardiac:  No chest pain, palpitations, orthopnea, or PND. GI:  Hungry.  Cooking smells cause nausea.  No vomiting, diarrhea, constipation, melena or hematochezia. GU:  No urgency, dysuria, or hematuria.  Musculoskeletal:  Fibromyalgia.   Osteoporosis.  Joints ache.   No muscle tenderness. Extremities:  No pain or swelling. Skin:  Scalp tenderness.  Hair coming out.  No rashes or skin changes. Neuro:  Neuropathy in 3rd and 4th digits of RIGHT hand.  No headache, numbness or weakness, balance or coordination issues. Endocrine:  No diabetes, thyroid issues, hot flashes or night sweats. Psych:  No mood changes, depression or anxiety.  Takes Ambien for sleep. Pain:  No focal pain today. Review of systems:  All other systems reviewed and found to be negative.  Physical Exam: Blood pressure (!) 148/75, pulse 89, temperature 97.9 F (36.6 C), temperature source Tympanic, resp. rate 16, weight 117 lb (53.1 kg).  GENERAL:  Thin woman sitting comfortably in the exam room in no acute distress. MENTAL STATUS:  Alert and oriented to person, place and time. HEAD:  Short gray blonde hair.  Normocephalic, atraumatic, face symmetric, no Cushingoid features. EYES:  Blue eyes.  Pupils equal round and reactive to light and accomodation.  No conjunctivitis or scleral icterus. ENT:   in place.  Oropharynx clear without lesion.  Tongue normal.  Mucous membranes moist.  RESPIRATORY:  Decreased breath sounds at the bases.  No rales, wheezes or rhonchi. CARDIOVASCULAR:  Regular rate and rhythm without murmur, rub or gallop. CHEST WALL:  Port-a-cath site unremarkable. ABDOMEN:  Soft, non tender with active bowel sounds and no hepatosplenomegaly.  No masses. SKIN:  No rashes, ulcers or lesions. EXTREMITIES: Arthritic changes in hands. Ankle edema.  No skin discoloration or tenderness.  No palpable cords. LYMPH NODES: No palpable cervical, supraclavicular, axillary or inguinal adenopathy  NEUROLOGICAL: Unremarkable. PSYCH:  Appropriate.   Appointment on 04/02/2017  Component Date Value Ref Range Status  . Sodium 04/02/2017 134* 135 - 145 mmol/L Final  . Potassium 04/02/2017 4.2  3.5 - 5.1 mmol/L Final  . Chloride 04/02/2017 102  101 - 111 mmol/L  Final  . CO2 04/02/2017 24  22 - 32 mmol/L Final  . Glucose, Bld 04/02/2017 106* 65 - 99 mg/dL Final  . BUN 04/02/2017 22* 6 - 20 mg/dL Final  . Creatinine, Ser 04/02/2017 1.12* 0.44 -  1.00 mg/dL Final  . Calcium 04/02/2017 8.3* 8.9 - 10.3 mg/dL Final  . Total Protein 04/02/2017 6.8  6.5 - 8.1 g/dL Final  . Albumin 04/02/2017 3.5  3.5 - 5.0 g/dL Final  . AST 04/02/2017 28  15 - 41 U/L Final  . ALT 04/02/2017 23  14 - 54 U/L Final  . Alkaline Phosphatase 04/02/2017 79  38 - 126 U/L Final  . Total Bilirubin 04/02/2017 0.3  0.3 - 1.2 mg/dL Final  . GFR calc non Af Amer 04/02/2017 47* >60 mL/min Final  . GFR calc Af Amer 04/02/2017 55* >60 mL/min Final   Comment: (NOTE) The eGFR has been calculated using the CKD EPI equation. This calculation has not been validated in all clinical situations. eGFR's persistently <60 mL/min signify possible Chronic Kidney Disease.   Georgiann Hahn gap 04/02/2017 8  5 - 15 Final   Performed at [facility], [address] [place], [place] [address]  . WBC 04/02/2017 2.3* 3.6 - 11.0 K/uL Final  . RBC 04/02/2017 3.54* 3.80 - 5.20 MIL/uL Final  . Hemoglobin 04/02/2017 11.0* 12.0 - 16.0 g/dL Final  . HCT 04/02/2017 32.3* 35.0 - 47.0 % Final  . MCV 04/02/2017 91.2  80.0 - 100.0 fL Final  . MCH 04/02/2017 31.1  26.0 - 34.0 pg Final  . MCHC 04/02/2017 34.1  32.0 - 36.0 g/dL Final  . RDW 04/02/2017 15.0* 11.5 - 14.5 % Final  . Platelets 04/02/2017 155  150 - 440 K/uL Final  . Neutrophils Relative % 04/02/2017 46  % Final  . Neutro Abs 04/02/2017 1.0* 1.4 - 6.5 K/uL Final  . Lymphocytes Relative 04/02/2017 33  % Final  . Lymphs Abs 04/02/2017 0.8* 1.0 - 3.6 K/uL Final  . Monocytes Relative 04/02/2017 14  % Final  . Monocytes Absolute 04/02/2017 0.3  0.2 - 0.9 K/uL Final  . Eosinophils Relative 04/02/2017 5  % Final  . Eosinophils Absolute 04/02/2017 0.1  0 - 0.7 K/uL Final  . Basophils Relative 04/02/2017 2  % Final  . Basophils Absolute 04/02/2017 0.1  0 -  0.1 K/uL Final   Performed at [facility], [address] [place], [place] [address]  . Magnesium 04/02/2017 1.9  1.7 - 2.4 mg/dL Final   Performed at [facility], [address] [place],  [address]    Assessment:  [name] [name] is a 77 y.o. female with presumed metastatic squamous cell lung cancer.  She presented with clinical [id number] squamous cell lung cancer of the left lung s/p bronchoscopy and biopsy on 07/01/2016.  She has a 40 pack year smoking history.  She presented with left lower chest wall pain.  Foundation One testing on 03/13/2017 revealed no reportable alterations in EGFR, RET, ALK, MET, ERBB2, BRAF, and ROS1.    Tumor was MS-stable.  TMB was 3 Muts/Mb.  There was CDKN2A loss, CDKN2B loss, KRAS G12V, MTAP loss, and TP53 R282W.  PD-L1 was 5%.  Chest CT with contrast on 06/11/2016 revealed a 3.2 x 3.0 mass like area of focal opacity in the medial left lower lobe with obliteration of segmental airways to the anterior left lower lobe.  PET scan on 06/27/2016 revealed a 4.3 x 3.2 cm central left lower lobe lung lesion (SUV 14.3) consistent with primary bronchogenic carcinoma.  There was equivocal nodal tissue in the subcarinal station demonstrating mild hypermetabolism (SUV 3.6). There was more peripheral left lower lobe increased atelectasis with mucoid impaction which is likely secondary to endobronchial  obstruction.  There was a small left pleural effusion.  Head MRI on 07/07/2016 revealed no evidence of metastatic disease.  She received radiation from 07/22/2016 - 09/19/2016.  She received 5 weeks of concurrent carboplatin and Taxol (07/24/2016 - 08/11/2016; 09/05/2016 - 09/16/2016). She requires a reduced dose of Benadryl (25 mg) for her premedication.  Week #3 was postponed secondary to chest pain and evaluation.  She missed a couple of weeks.  Chest CT on 11/21/2016 revealed progressive collapse of the LEFT lower lobe presumably related to central  obstructing mass.  Mass obstructed the LEFT lower lobe bronchus, although mass was not well demonstrated.  There were small effusions (no change).  There was centrilobular emphysema unchanged.  There was no evidence pneumonia.  PET scan on 01/27/2017 revealed interval response to therapy.  The previously noted hypermetabolism associated with left lower lobe perihilar lung mass has resolved in the interval.  There was a moderate left pleural effusion that had increased in volume from previous PET-CT and there was now complete atelectasis/consolidation of the left lower lobe, which may obscure residual mass.  There was no new sites of hypermetabolism or evidence of distant metastatic disease.  Bronchoscopy on 02/09/2017 revealed no evidence of endobronchial tumor.  There was stenosis at the entry to the left lower posterior segments which could not be entered.  There was mildly erythematous mucosa with copious secretions in the left lower lobe.  Brushings were positive for non-small cell carcinoma, favor squamous cell carcinoma.  Ultrasound guided thoracentesis on 02/17/2017 revealed was negative for malignancy.  Bone scan on 02/27/2017 revealed a subtle focus of uptake in the left 7th rib, indeterminate.  She is day 15 of cycle #1 carboplatin, Abraxane and pembrolizumab (03/19/2017).  She has a normocytic anemia likely due to chemotherapy.  Work-up on 07/24/2016 revealed the following normal labs: ferritin (103), B12 (345), folate(19.7).  Iron saturation was 8% and TIBC was 258.  She has a history of stage IA left breast cancer in 02/2005.  She underwent lumpectomy.  Pathology revealed a T1cN0 lesion.  Tumor was ER + , PR +, and Her2/neu -.  Two sentinel lymph nodes were negative.  She received chemotherapy (4 cycles of AC and possibly an abbreviated course of Taxol- no records available).  She received radiation.  She completed Femara in 08/2010.  She has fibromyalgia.  She has advanced  thoracoabdominal aortic atherosclerosis with fusiform dilatation of the descending thoracic aorta up to 4.3 cm.  She is followed by Dr Lucky Cowboy.  She has a history of recurrent UTIs.  UA on 03/19/2017 revealed  >100,000 CFU/mL of an ESBL producing E. coli.  She was treated with fosfomycin 3 g x 1 dose.  Symptomatically, she has a chronic cough. She is more short of breath; oxygen machine has "broke" last night.  She denies urinary symptoms.   Exam is stable.  Sodium is 134. BUN 22, creatinine 1.12 (CrCl 34.9 mL/min). WBC is 2300 with and ANC of 1000. Hemoglobin is 11.0, hematocrit 32.3, and platelets 155,000.  Plan: 1.  Labs today:  CBC with diff, CMP, Mg. 2.  Labs reviewed. Blood counts are not adequate enough for treatment. ANC low.   Discuss neutropenic precautions.  Will hold treatment today.   3.  Discuss original plan for carboplatin + Abraxane + pembrolizumab (Keytruda) every 3 weeks x 4 cycles followed by pembrolizumab every 3 weeks.  Cycles #1-4, treatment typically on day 1, 8, and 15.  Day 1 consists of carboplatin + Abraxane +  pembrolizumab.  Day 8 and 15 consists of Abraxane alone.  Patient unable to receive day 15 secondary to counts.   4.  RTC in 1 week for MD assessment, labs (CBC with diff, CMP, Mg, CEA), and day 1 of cycle #2 carboplatin + Abraxane + pembrolizumab.   Honor Loh, NP  04/02/2017, 9:33 AM  I saw and evaluated the patient, participating in the key portions of the service and reviewing pertinent diagnostic studies and records.  I reviewed the nurse practitioner's note and agree with the findings and the plan.  The assessment and plan were discussed with the patient.  Several questions were asked by the patient and answered.   Nolon Stalls, MD 04/02/2017,9:33 AM

## 2017-04-02 NOTE — Progress Notes (Signed)
Patient is here today for a follow up. Patient has no new concerns today.  

## 2017-04-04 ENCOUNTER — Encounter: Payer: Self-pay | Admitting: Hematology and Oncology

## 2017-04-09 ENCOUNTER — Encounter: Payer: Self-pay | Admitting: Hematology and Oncology

## 2017-04-09 ENCOUNTER — Inpatient Hospital Stay (HOSPITAL_BASED_OUTPATIENT_CLINIC_OR_DEPARTMENT_OTHER): Payer: Medicare HMO | Admitting: Hematology and Oncology

## 2017-04-09 ENCOUNTER — Inpatient Hospital Stay: Payer: Medicare HMO

## 2017-04-09 VITALS — BP 142/82 | HR 89 | Temp 96.9°F | Resp 18 | Wt 117.4 lb

## 2017-04-09 DIAGNOSIS — Z7189 Other specified counseling: Secondary | ICD-10-CM

## 2017-04-09 DIAGNOSIS — D701 Agranulocytosis secondary to cancer chemotherapy: Secondary | ICD-10-CM

## 2017-04-09 DIAGNOSIS — R5383 Other fatigue: Secondary | ICD-10-CM | POA: Diagnosis not present

## 2017-04-09 DIAGNOSIS — Z792 Long term (current) use of antibiotics: Secondary | ICD-10-CM

## 2017-04-09 DIAGNOSIS — Z9981 Dependence on supplemental oxygen: Secondary | ICD-10-CM

## 2017-04-09 DIAGNOSIS — D649 Anemia, unspecified: Secondary | ICD-10-CM

## 2017-04-09 DIAGNOSIS — M797 Fibromyalgia: Secondary | ICD-10-CM

## 2017-04-09 DIAGNOSIS — I110 Hypertensive heart disease with heart failure: Secondary | ICD-10-CM

## 2017-04-09 DIAGNOSIS — G620 Drug-induced polyneuropathy: Secondary | ICD-10-CM

## 2017-04-09 DIAGNOSIS — C3432 Malignant neoplasm of lower lobe, left bronchus or lung: Secondary | ICD-10-CM

## 2017-04-09 DIAGNOSIS — J90 Pleural effusion, not elsewhere classified: Secondary | ICD-10-CM

## 2017-04-09 DIAGNOSIS — J449 Chronic obstructive pulmonary disease, unspecified: Secondary | ICD-10-CM

## 2017-04-09 DIAGNOSIS — R197 Diarrhea, unspecified: Secondary | ICD-10-CM

## 2017-04-09 DIAGNOSIS — M899 Disorder of bone, unspecified: Secondary | ICD-10-CM

## 2017-04-09 DIAGNOSIS — T451X5S Adverse effect of antineoplastic and immunosuppressive drugs, sequela: Secondary | ICD-10-CM | POA: Diagnosis not present

## 2017-04-09 DIAGNOSIS — R110 Nausea: Secondary | ICD-10-CM | POA: Diagnosis not present

## 2017-04-09 DIAGNOSIS — Z87440 Personal history of urinary (tract) infections: Secondary | ICD-10-CM

## 2017-04-09 DIAGNOSIS — M069 Rheumatoid arthritis, unspecified: Secondary | ICD-10-CM

## 2017-04-09 DIAGNOSIS — I700 Atherosclerosis of aorta: Secondary | ICD-10-CM

## 2017-04-09 DIAGNOSIS — E871 Hypo-osmolality and hyponatremia: Secondary | ICD-10-CM | POA: Diagnosis not present

## 2017-04-09 DIAGNOSIS — N390 Urinary tract infection, site not specified: Secondary | ICD-10-CM | POA: Diagnosis not present

## 2017-04-09 DIAGNOSIS — Z170 Estrogen receptor positive status [ER+]: Secondary | ICD-10-CM

## 2017-04-09 DIAGNOSIS — Z923 Personal history of irradiation: Secondary | ICD-10-CM

## 2017-04-09 DIAGNOSIS — F17210 Nicotine dependence, cigarettes, uncomplicated: Secondary | ICD-10-CM

## 2017-04-09 DIAGNOSIS — Z853 Personal history of malignant neoplasm of breast: Secondary | ICD-10-CM

## 2017-04-09 DIAGNOSIS — I509 Heart failure, unspecified: Secondary | ICD-10-CM

## 2017-04-09 DIAGNOSIS — Z5111 Encounter for antineoplastic chemotherapy: Secondary | ICD-10-CM

## 2017-04-09 DIAGNOSIS — Z9221 Personal history of antineoplastic chemotherapy: Secondary | ICD-10-CM

## 2017-04-09 DIAGNOSIS — Z9223 Personal history of estrogen therapy: Secondary | ICD-10-CM

## 2017-04-09 LAB — CBC WITH DIFFERENTIAL/PLATELET
Basophils Absolute: 0.1 10*3/uL (ref 0–0.1)
Basophils Relative: 1 %
Eosinophils Absolute: 0.1 10*3/uL (ref 0–0.7)
Eosinophils Relative: 3 %
HCT: 30.4 % — ABNORMAL LOW (ref 35.0–47.0)
Hemoglobin: 10.4 g/dL — ABNORMAL LOW (ref 12.0–16.0)
Lymphocytes Relative: 21 %
Lymphs Abs: 1 10*3/uL (ref 1.0–3.6)
MCH: 31.2 pg (ref 26.0–34.0)
MCHC: 34.1 g/dL (ref 32.0–36.0)
MCV: 91.4 fL (ref 80.0–100.0)
Monocytes Absolute: 0.8 10*3/uL (ref 0.2–0.9)
Monocytes Relative: 17 %
Neutro Abs: 3 10*3/uL (ref 1.4–6.5)
Neutrophils Relative %: 58 %
Platelets: 195 10*3/uL (ref 150–440)
RBC: 3.33 MIL/uL — ABNORMAL LOW (ref 3.80–5.20)
RDW: 15.7 % — ABNORMAL HIGH (ref 11.5–14.5)
WBC: 5.1 10*3/uL (ref 3.6–11.0)

## 2017-04-09 LAB — COMPREHENSIVE METABOLIC PANEL
ALT: 13 U/L — ABNORMAL LOW (ref 14–54)
AST: 17 U/L (ref 15–41)
Albumin: 3.4 g/dL — ABNORMAL LOW (ref 3.5–5.0)
Alkaline Phosphatase: 118 U/L (ref 38–126)
Anion gap: 6 (ref 5–15)
BUN: 13 mg/dL (ref 6–20)
CO2: 27 mmol/L (ref 22–32)
Calcium: 8.9 mg/dL (ref 8.9–10.3)
Chloride: 101 mmol/L (ref 101–111)
Creatinine, Ser: 1.15 mg/dL — ABNORMAL HIGH (ref 0.44–1.00)
GFR calc Af Amer: 53 mL/min — ABNORMAL LOW (ref >60)
GFR calc non Af Amer: 46 mL/min — ABNORMAL LOW (ref >60)
Glucose, Bld: 106 mg/dL — ABNORMAL HIGH (ref 65–99)
Potassium: 4.2 mmol/L (ref 3.5–5.1)
Sodium: 134 mmol/L — ABNORMAL LOW (ref 135–145)
Total Bilirubin: 0.5 mg/dL (ref 0.3–1.2)
Total Protein: 7 g/dL (ref 6.5–8.1)

## 2017-04-09 LAB — URINALYSIS, COMPLETE (UACMP) WITH MICROSCOPIC
Bilirubin Urine: NEGATIVE
Glucose, UA: NEGATIVE mg/dL
KETONES UR: NEGATIVE mg/dL
Nitrite: POSITIVE — AB
PROTEIN: NEGATIVE mg/dL
Specific Gravity, Urine: 1.003 — ABNORMAL LOW (ref 1.005–1.030)
pH: 6 (ref 5.0–8.0)

## 2017-04-09 LAB — MAGNESIUM: Magnesium: 1.9 mg/dL (ref 1.7–2.4)

## 2017-04-09 MED ORDER — PACLITAXEL PROTEIN-BOUND CHEMO INJECTION 100 MG
100.0000 mg/m2 | Freq: Once | Status: AC
  Administered 2017-04-09: 150 mg via INTRAVENOUS
  Filled 2017-04-09: qty 30

## 2017-04-09 MED ORDER — HEPARIN SOD (PORK) LOCK FLUSH 100 UNIT/ML IV SOLN
500.0000 [IU] | Freq: Once | INTRAVENOUS | Status: DC | PRN
  Filled 2017-04-09: qty 5

## 2017-04-09 MED ORDER — SODIUM CHLORIDE 0.9 % IV SOLN
230.0000 mg | Freq: Once | INTRAVENOUS | Status: AC
  Administered 2017-04-09: 230 mg via INTRAVENOUS
  Filled 2017-04-09: qty 23

## 2017-04-09 MED ORDER — SODIUM CHLORIDE 0.9% FLUSH
10.0000 mL | INTRAVENOUS | Status: DC | PRN
  Administered 2017-04-09: 10 mL via INTRAVENOUS
  Filled 2017-04-09: qty 10

## 2017-04-09 MED ORDER — SODIUM CHLORIDE 0.9 % IV SOLN
Freq: Once | INTRAVENOUS | Status: AC
Start: 2017-04-09 — End: 2017-04-09
  Administered 2017-04-09: 10:00:00 via INTRAVENOUS
  Filled 2017-04-09: qty 1000

## 2017-04-09 MED ORDER — SODIUM CHLORIDE 0.9 % IV SOLN
Freq: Once | INTRAVENOUS | Status: AC
  Administered 2017-04-09: 10:00:00 via INTRAVENOUS
  Filled 2017-04-09: qty 5

## 2017-04-09 MED ORDER — HEPARIN SOD (PORK) LOCK FLUSH 100 UNIT/ML IV SOLN
500.0000 [IU] | Freq: Once | INTRAVENOUS | Status: AC
  Administered 2017-04-09: 500 [IU] via INTRAVENOUS

## 2017-04-09 MED ORDER — PEMBROLIZUMAB CHEMO INJECTION 100 MG/4ML
200.0000 mg | Freq: Once | INTRAVENOUS | Status: AC
  Administered 2017-04-09: 200 mg via INTRAVENOUS
  Filled 2017-04-09: qty 8

## 2017-04-09 MED ORDER — SODIUM CHLORIDE 0.9% FLUSH
10.0000 mL | INTRAVENOUS | Status: DC | PRN
  Filled 2017-04-09: qty 10

## 2017-04-09 MED ORDER — PALONOSETRON HCL INJECTION 0.25 MG/5ML
0.2500 mg | Freq: Once | INTRAVENOUS | Status: AC
  Administered 2017-04-09: 0.25 mg via INTRAVENOUS
  Filled 2017-04-09: qty 5

## 2017-04-09 NOTE — Progress Notes (Signed)
Pt in for follow up, reports doing good.  Has good appetite. Reports may have a UTI, having increased frequency.

## 2017-04-09 NOTE — Progress Notes (Signed)
[name] day:  04/09/2017    Chief Complaint: [name] [name] is a 77 y.o. female with presumed metastatic squamous cell carcinoma of the left lower lobe who is seen for assessment prior to day 1 of cycle #2 carboplatin, Abraxane, and pembrolizumab.  HPI: The patient was last seen in the medical oncology clinic on 04/02/2017.  At that time, she had a chronic cough. She was more short of breath; oxygen machine has "broke" last night.  She denied urinary symptoms.   Exam was stable.  Sodium was 134. BUN 22, creatinine 1.12 (CrCl 34.9 mL/min). WBC was 2300 with and ANC of 1000.  Chemotherapy was held.  During the interim, patient has been doing well. She continues to have a chronic cough. Patient states, "it is time for me to get a sinus infection. I get one every year because of allergies". Patient denies increased shortness of breath. She does not have her supplemental oxygen in place.  Patient has nausea when her son is cooking "certain things".  Patient his having neuropathy in the 3rd-5th digits on her RIGHT hand only. Patient complains of scalp tenderness. She has lost the majority of her hair.   Patient states, "it is hard for me to urinate. I am taking Azo. It will straighten up". Patient denies B symptoms. She is eating well. Her weight is stable.  Patient denies pain in the clinic today.    Past Medical History:  Diagnosis Date  . Asthma   . Breast cancer ([place]) 2009   left  . Cancer ([place])   . CHF (congestive heart failure) ([place])   . CHF (congestive heart failure) ([place])   . Chronic UTI   . COPD (chronic obstructive pulmonary disease) ([place])   . Dizziness   . Fibromyalgia   . Hypertension   . Neuropathy   . Personal history of tobacco use, presenting hazards to health 05/17/2015  . Polyp, larynx   . RA (rheumatoid arthritis) ([place])   . Sinus infection    recent  . Stumbling gait    to the left  . Supplemental oxygen dependent    2.5l     Past Surgical History:  Procedure Laterality Date  . ABDOMINAL HYSTERECTOMY    . BREAST LUMPECTOMY Left 2009   chemo and radiation  . CYST EXCISION Left 02/27/2015   Procedure: CYST REMOVAL;  Surgeon: [name] Knows, MD;  Location: [facility] OR[facility];  Service: Orthopedics;  Laterality: Left;  . EYE MUSCLE SURGERY Right    13 surgeries  . FLEXIBLE BRONCHOSCOPY N/A 07/01/2016   Procedure: FLEXIBLE BRONCHOSCOPY;  Surgeon: [name] Mcardle, MD;  Location: [facility];  Service: Pulmonary;  Laterality: N/A;  . FLEXIBLE BRONCHOSCOPY N/A 02/09/2017   Procedure: FLEXIBLE BRONCHOSCOPY;  Surgeon: [name] Hobby, MD;  Location: [facility];  Service: Pulmonary;  Laterality: N/A;  . PORTA CATH INSERTION N/A 03/04/2017   Procedure: PORTA CATH INSERTION;  Surgeon: [name] Huxley, MD;  Location: [place] [facility];  Service: Cardiovascular;  Laterality: N/A;  . THUMB ARTHROSCOPY Left     Family History  Problem Relation Age of Onset  . Diabetes Father   . Stroke Father   . Heart attack Father   . CAD Sister     Social History:  reports that she has been smoking cigarettes.  She has a 40.00 pack-year smoking history. She has never used smokeless tobacco. She reports that she does not drink alcohol or use drugs.  He lives  with her son and grandson. The patient is alone today.   Allergies:  Allergies  Allergen Reactions  . Contrast Media [Iodinated Diagnostic Agents] Other (See Comments)    Pt was sent to the ED following contrast media injection at [place]. Unknown reason. She has been premedicated since without complications. Pt to be premedicated prior to contrast media injections  . Ioxaglate Other (See Comments)    Pt was sent to the ED following contrast media injection at [place]. Unknown reason. She has been premedicated since without complications. Pt to be premedicated prior to contrast media injections  . Sulfa Antibiotics Other (See Comments)    Unknown  . Tetracycline  Hives  . White Petrolatum Other (See Comments)    Blisters  . Amoxicillin-Pot Clavulanate Rash and Other (See Comments)    Blisters in mouth Has patient had a PCN reaction causing immediate rash, facial/tongue/throat swelling, SOB or lightheadedness with hypotension: No Has patient had a PCN reaction causing severe rash involving mucus membranes or skin necrosis: No Has patient had a PCN reaction that required hospitalization: No Has patient had a PCN reaction occurring within the last 10 years: No If all of the above answers are "NO", then may proceed with Cephalosporin use.   . Tape Rash    Current Medications: Current Outpatient Medications  Medication Sig Dispense Refill  . albuterol (PROVENTIL HFA;VENTOLIN HFA) 108 (90 Base) MCG/ACT inhaler Inhale 1-2 puffs into the lungs every 6 (six) hours as needed for wheezing or shortness of breath. 1 Inhaler 2  . Calcium-Vitamin D 600-200 MG-UNIT tablet Take 1 tablet by mouth daily.     . carvedilol (COREG) 12.5 MG tablet Take 1 tablet (12.5 mg total) by mouth 2 (two) times daily with a meal. 60 tablet 0  . cholecalciferol (VITAMIN D) 1000 units tablet Take 1,000 Units by mouth daily.    . clonazePAM (KLONOPIN) 1 MG tablet Take 1 mg by mouth 2 (two) times daily as needed for anxiety.     [name] dexamethasone (DECADRON) 4 MG tablet Take 2 tablets by mouth once a day on the day after chemotherapy and then take 2 tablets two times a day for 2 days. Take with food. 30 tablet 1  . gabapentin (NEURONTIN) 600 MG tablet Take 600 mg by mouth 3 (three) times daily.     [name] HYDROcodone-acetaminophen (NORCO/VICODIN) 5-325 MG tablet Take 1 tablet by mouth 2 (two) times daily.     [name] imipramine (TOFRANIL) 25 MG tablet Take 25 mg by mouth at bedtime.     . lidocaine-prilocaine (EMLA) cream Apply 1 application topically as needed. 30 g 3  . LORazepam (ATIVAN) 0.5 MG tablet Take 1 tablet (0.5 mg total) by mouth every 6 (six) hours as needed (Nausea or vomiting). 30  tablet 0  . Multiple Vitamin (MULTIVITAMIN) capsule Take 1 capsule by mouth daily.    . nitroGLYCERIN (NITROSTAT) 0.4 MG SL tablet Place 0.4 mg under the tongue every 5 (five) minutes as needed for chest pain.     [name] ondansetron (ZOFRAN) 8 MG tablet Take 1 tablet (8 mg total) by mouth 2 (two) times daily as needed. Start on the third day after chemotherapy. 30 tablet 1  . Tiotropium Bromide Monohydrate (SPIRIVA RESPIMAT) 2.5 MCG/ACT AERS Inhale 2.5 mcg into the lungs 2 (two) times daily. 1 Inhaler 0  . traMADol (ULTRAM) 50 MG tablet Take 100 mg by mouth daily.     . vitamin C (ASCORBIC ACID) 500 MG tablet Take 500  mg by mouth daily.    [name] ZINC SULFATE PO Take 1 tablet by mouth daily.     [name] zolpidem (AMBIEN) 5 MG tablet Take 5 mg by mouth at bedtime.    . potassium chloride (K-DUR) 10 MEQ tablet Take 1 tablet (10 mEq total) by mouth daily. (Patient not taking: Reported on 04/02/2017) 10 tablet 0  . sucralfate (CARAFATE) 1 g tablet Take 1 tablet (1 g total) by mouth 3 (three) times daily. (Patient not taking: Reported on 03/03/2017) 90 tablet 6   No current facility-administered medications for this visit.    Facility-Administered Medications Ordered in Other Visits  Medication Dose Route Frequency Provider Last Rate Last Dose  . heparin lock flush 100 unit/mL  500 Units Intravenous Once [name], Melissa C, MD      . sodium chloride flush (NS) 0.9 % injection 10 mL  10 mL Intravenous PRN Lequita Asal, MD   10 mL at 04/09/17 0836    Review of Systems:  GENERAL:  Feels "good". No fever, chills, or sweats.  No fevers. Weight stable. PERFORMANCE STATUS (ECOG):  2 HEENT:  No visual changes, runny nose, sore throat, mouth sores or tenderness. Lungs: Chronic shortness of breath.  Cough.  No hemoptysis.  On oxygen 3 L/[place]. Cardiac:  No chest pain, palpitations, orthopnea, or PND. GI:  Hungry.  Cooking smells cause nausea.  No vomiting, diarrhea, constipation, melena or hematochezia. GU:  "Hard to  urinate". No urgency, dysuria, or hematuria.  Musculoskeletal:  Fibromyalgia.  Osteoporosis.  Joints ache.   No muscle tenderness. Extremities:  No pain or swelling. Skin:  Scalp tenderness.  Hair coming out.  No rashes or skin changes. Neuro:  Neuropathy in 3rd- 5th digits of RIGHT hand.  No headache, numbness or weakness, balance or coordination issues. Endocrine:  No diabetes, thyroid issues, hot flashes or night sweats. Psych:  No mood changes, depression or anxiety.  Takes Ambien for sleep. Pain:  No focal pain today. Review of systems:  All other systems reviewed and found to be negative.  Physical Exam: Blood pressure (!) 142/82, pulse 89, temperature (!) 96.9 F (36.1 C), temperature source Tympanic, resp. rate 18, weight 117 lb 6 oz (53.2 kg), SpO2 93 %.  GENERAL:  Thin woman sitting comfortably in the exam room in no acute distress. MENTAL STATUS:  Alert and oriented to person, place and time. HEAD:  Wearing a pink hat.  Thin gray blonde hair.  Normocephalic, atraumatic, face symmetric, no Cushingoid features. EYES:  Blue eyes.  Pupils equal round and reactive to light and accomodation.  No conjunctivitis or scleral icterus. ENT:  Oropharynx clear without lesion.  Tongue normal.  Mucous membranes moist.  RESPIRATORY:  Decreased breath sounds at the left base.  No rales, wheezes or rhonchi. CARDIOVASCULAR:  Regular rate and rhythm without murmur, rub or gallop. ABDOMEN:  Soft, non tender with active bowel sounds and no hepatosplenomegaly.  No masses. SKIN:  No rashes, ulcers or lesions. EXTREMITIES: Arthritic changes in hands. Ankle edema.  No skin discoloration or tenderness.  No palpable cords. LYMPH NODES: No palpable cervical, supraclavicular, axillary or inguinal adenopathy  NEUROLOGICAL: Unremarkable. PSYCH:  Appropriate.   Infusion on 04/09/2017  Component Date Value Ref Range Status  . Magnesium 04/09/2017 1.9  1.7 - 2.4 mg/dL Final   Performed at [facility],  [address], [place], [place] [address]  . Sodium 04/09/2017 134* 135 - 145 mmol/L Final  . Potassium 04/09/2017 4.2  3.5 - 5.1  mmol/L Final  . Chloride 04/09/2017 101  101 - 111 mmol/L Final  . CO2 04/09/2017 27  22 - 32 mmol/L Final  . Glucose, Bld 04/09/2017 106* 65 - 99 mg/dL Final  . BUN 04/09/2017 13  6 - 20 mg/dL Final  . Creatinine, Ser 04/09/2017 1.15* 0.44 - 1.00 mg/dL Final  . Calcium 04/09/2017 8.9  8.9 - 10.3 mg/dL Final  . Total Protein 04/09/2017 7.0  6.5 - 8.1 g/dL Final  . Albumin 04/09/2017 3.4* 3.5 - 5.0 g/dL Final  . AST 04/09/2017 17  15 - 41 U/L Final  . ALT 04/09/2017 13* 14 - 54 U/L Final  . Alkaline Phosphatase 04/09/2017 118  38 - 126 U/L Final  . Total Bilirubin 04/09/2017 0.5  0.3 - 1.2 mg/dL Final  . GFR calc non Af Amer 04/09/2017 46* >60 mL/min Final  . GFR calc Af Amer 04/09/2017 53* >60 mL/min Final   Comment: (NOTE) The eGFR has been calculated using the CKD EPI equation. This calculation has not been validated in all clinical situations. eGFR's persistently <60 mL/min signify possible Chronic Kidney Disease.   Georgiann Hahn gap 04/09/2017 6  5 - 15 Final   Performed at [facility], [address] [place], [place] [address]  . WBC 04/09/2017 5.1  3.6 - 11.0 K/uL Final  . RBC 04/09/2017 3.33* 3.80 - 5.20 MIL/uL Final  . Hemoglobin 04/09/2017 10.4* 12.0 - 16.0 g/dL Final  . HCT 04/09/2017 30.4* 35.0 - 47.0 % Final  . MCV 04/09/2017 91.4  80.0 - 100.0 fL Final  . MCH 04/09/2017 31.2  26.0 - 34.0 pg Final  . MCHC 04/09/2017 34.1  32.0 - 36.0 g/dL Final  . RDW 04/09/2017 15.7* 11.5 - 14.5 % Final  . Platelets 04/09/2017 195  150 - 440 K/uL Final  . Neutrophils Relative % 04/09/2017 58  % Final  . Neutro Abs 04/09/2017 3.0  1.4 - 6.5 K/uL Final  . Lymphocytes Relative 04/09/2017 21  % Final  . Lymphs Abs 04/09/2017 1.0  1.0 - 3.6 K/uL Final  . Monocytes Relative 04/09/2017 17  % Final  . Monocytes Absolute 04/09/2017 0.8  0.2 - 0.9 K/uL Final   . Eosinophils Relative 04/09/2017 3  % Final  . Eosinophils Absolute 04/09/2017 0.1  0 - 0.7 K/uL Final  . Basophils Relative 04/09/2017 1  % Final  . Basophils Absolute 04/09/2017 0.1  0 - 0.1 K/uL Final   Performed at [facility], [address], [place], [place] [address]    Assessment:  [name] [name] is a 77 y.o. female with presumed metastatic squamous cell lung cancer.  She presented with clinical [id number] squamous cell lung cancer of the left lung s/p bronchoscopy and biopsy on 07/01/2016.  She has a 40 pack year smoking history.  She presented with left lower chest wall pain.  Foundation One testing on 03/13/2017 revealed no reportable alterations in EGFR, RET, ALK, MET, ERBB2, BRAF, and ROS1.    Tumor was MS-stable.  TMB was 3 Muts/Mb.  There was CDKN2A loss, CDKN2B loss, KRAS G12V, MTAP loss, and TP53 R282W.  PD-L1 was 5%.  Chest CT with contrast on 06/11/2016 revealed a 3.2 x 3.0 mass like area of focal opacity in the medial left lower lobe with obliteration of segmental airways to the anterior left lower lobe.  PET scan on 06/27/2016 revealed a 4.3 x 3.2 cm central left lower lobe lung lesion (SUV 14.3) consistent with primary bronchogenic carcinoma.  There  was equivocal nodal tissue in the subcarinal station demonstrating mild hypermetabolism (SUV 3.6). There was more peripheral left lower lobe increased atelectasis with mucoid impaction which is likely secondary to endobronchial obstruction.  There was a small left pleural effusion.  Head MRI on 07/07/2016 revealed no evidence of metastatic disease.  She received radiation from 07/22/2016 - 09/19/2016.  She received 5 weeks of concurrent carboplatin and Taxol (07/24/2016 - 08/11/2016; 09/05/2016 - 09/16/2016). She requires a reduced dose of Benadryl (25 mg) for her premedication.  Week #3 was postponed secondary to chest pain and evaluation.  She missed a couple of weeks.  Chest CT on 11/21/2016 revealed progressive  collapse of the LEFT lower lobe presumably related to central obstructing mass.  Mass obstructed the LEFT lower lobe bronchus, although mass was not well demonstrated.  There were small effusions (no change).  There was centrilobular emphysema unchanged.  There was no evidence pneumonia.  PET scan on 01/27/2017 revealed interval response to therapy.  The previously noted hypermetabolism associated with left lower lobe perihilar lung mass has resolved in the interval.  There was a moderate left pleural effusion that had increased in volume from previous PET-CT and there was now complete atelectasis/consolidation of the left lower lobe, which may obscure residual mass.  There was no new sites of hypermetabolism or evidence of distant metastatic disease.  Bronchoscopy on 02/09/2017 revealed no evidence of endobronchial tumor.  There was stenosis at the entry to the left lower posterior segments which could not be entered.  There was mildly erythematous mucosa with copious secretions in the left lower lobe.  Brushings were positive for non-small cell carcinoma, favor squamous cell carcinoma.  Ultrasound guided thoracentesis on 02/17/2017 revealed was negative for malignancy.  Bone scan on 02/27/2017 revealed a subtle focus of uptake in the left 7th rib, indeterminate.  She is day 1 of cycle #2 carboplatin, Abraxane and pembrolizumab (03/19/2017 - 04/09/2017).  She has a normocytic anemia likely due to chemotherapy.  Work-up on 07/24/2016 revealed the following normal labs: ferritin (103), B12 (345), folate(19.7).  Iron saturation was 8% and TIBC was 258.  She has a history of stage IA left breast cancer in 02/2005.  She underwent lumpectomy.  Pathology revealed a T1cN0 lesion.  Tumor was ER + , PR +, and Her2/neu -.  Two sentinel lymph nodes were negative.  She received chemotherapy (4 cycles of AC and possibly an abbreviated course of Taxol- no records available).  She received radiation.  She completed  Femara in 08/2010.  She has fibromyalgia.  She has advanced thoracoabdominal aortic atherosclerosis with fusiform dilatation of the descending thoracic aorta up to 4.3 cm.  She is followed by Dr Lucky Cowboy.  She has a history of recurrent UTIs.  UA on 03/19/2017 revealed  >100,000 CFU/mL of an ESBL producing E. coli.  She was treated with fosfomycin 3 g x 1 dose.  Symptomatically, she has a chronic cough. Shortness of breath is better. She is not wearing supplemental oxygen. She complains of difficulties urinating; denies dysuria.  Exam is stable.  Sodium is 134. BUN 13, creatinine 1.15 (CrCl 33.9 mL/min). WBC is 5100 with and ANC of 3000. Hemoglobin is 10.4, hematocrit 30.4, and platelets 195,000.  Plan: 1.  Labs today:  CBC with diff, CMP, Mg, CEA, UA with C&S 2.  Labs reviewed. Blood counts adequate for treatment.  Begin day 1 of cycle #2 carboplatin + Abraxane + pembrolizumab. 3.  RTC in 1 week for labs (CBC with diff, CMP,  B12, folate, ferritin, iron studies, TSH), and day 8 Abraxane. 4.  RTC in 2 weeks for labs (CBC with diff, BMP, Mg). 5.  RTC in 3 weeks for MD assessment, labs (CBC with diff, CMP, Mg, CEA), and day 1 of cycle #3 carboplatin + Abraxane + pembrolizumab.   Honor Loh, NP  04/09/2017, 9:09 AM  I saw and evaluated the patient, participating in the key portions of the service and reviewing pertinent diagnostic studies and records.  I reviewed the nurse practitioner's note and agree with the findings and the plan.  The assessment and plan were discussed with the patient.  Several questions were asked by the patient and answered.   Nolon Stalls, MD 04/09/2017,9:09 AM

## 2017-04-09 NOTE — Progress Notes (Signed)
Pt seen by MD, Per Honor Loh NP proceed with treatment, Urine sample collected per order.

## 2017-04-10 LAB — CEA: CEA: 7.9 ng/mL — ABNORMAL HIGH (ref 0.0–4.7)

## 2017-04-11 ENCOUNTER — Other Ambulatory Visit: Payer: Self-pay | Admitting: Urgent Care

## 2017-04-11 DIAGNOSIS — Z87440 Personal history of urinary (tract) infections: Secondary | ICD-10-CM | POA: Insufficient documentation

## 2017-04-11 DIAGNOSIS — N390 Urinary tract infection, site not specified: Secondary | ICD-10-CM

## 2017-04-11 DIAGNOSIS — Z1612 Extended spectrum beta lactamase (ESBL) resistance: Secondary | ICD-10-CM

## 2017-04-11 DIAGNOSIS — B9629 Other Escherichia coli [E. coli] as the cause of diseases classified elsewhere: Secondary | ICD-10-CM

## 2017-04-11 DIAGNOSIS — D649 Anemia, unspecified: Secondary | ICD-10-CM | POA: Insufficient documentation

## 2017-04-11 LAB — URINE CULTURE

## 2017-04-11 MED ORDER — FOSFOMYCIN TROMETHAMINE 3 G PO PACK: 3.0000 g | PACK | Freq: Once | ORAL | 0 refills | Status: AC

## 2017-04-14 ENCOUNTER — Telehealth: Payer: Self-pay | Admitting: *Deleted

## 2017-04-14 NOTE — Telephone Encounter (Signed)
Patient called and stated that a prescription was to be called in for her and that it was not sent. On further questioning, it was from Dr Jonathon Resides, I advised she call [facility] to see if Dr Ginette Pitman had Mimi to call something in

## 2017-04-15 LAB — ACID FAST CULTURE WITH REFLEXED SENSITIVITIES: ACID FAST CULTURE - AFSCU3: NEGATIVE

## 2017-04-15 LAB — ACID FAST CULTURE WITH REFLEXED SENSITIVITIES (MYCOBACTERIA)

## 2017-04-16 ENCOUNTER — Inpatient Hospital Stay: Payer: Medicare HMO

## 2017-04-16 ENCOUNTER — Ambulatory Visit
Admission: RE | Admit: 2017-04-16 | Discharge: 2017-04-16 | Disposition: A | Discharge status: Home / Self Care | Payer: Medicare HMO | Source: Ambulatory Visit | Attending: Oncology | Admitting: Oncology

## 2017-04-16 ENCOUNTER — Inpatient Hospital Stay: Payer: Medicare HMO | Attending: Hematology and Oncology

## 2017-04-16 ENCOUNTER — Inpatient Hospital Stay (HOSPITAL_BASED_OUTPATIENT_CLINIC_OR_DEPARTMENT_OTHER): Payer: Medicare HMO | Admitting: Oncology

## 2017-04-16 ENCOUNTER — Other Ambulatory Visit: Payer: Self-pay | Admitting: Hematology and Oncology

## 2017-04-16 VITALS — BP 134/84 | HR 83 | Temp 97.6°F | Resp 18

## 2017-04-16 VITALS — BP 137/81 | HR 102 | Temp 98.4°F | Resp 20 | Wt 117.0 lb

## 2017-04-16 DIAGNOSIS — R5381 Other malaise: Secondary | ICD-10-CM | POA: Diagnosis not present

## 2017-04-16 DIAGNOSIS — I509 Heart failure, unspecified: Secondary | ICD-10-CM | POA: Diagnosis not present

## 2017-04-16 DIAGNOSIS — M069 Rheumatoid arthritis, unspecified: Secondary | ICD-10-CM | POA: Insufficient documentation

## 2017-04-16 DIAGNOSIS — Z9981 Dependence on supplemental oxygen: Secondary | ICD-10-CM | POA: Diagnosis not present

## 2017-04-16 DIAGNOSIS — R0602 Shortness of breath: Secondary | ICD-10-CM

## 2017-04-16 DIAGNOSIS — R52 Pain, unspecified: Secondary | ICD-10-CM | POA: Insufficient documentation

## 2017-04-16 DIAGNOSIS — E860 Dehydration: Secondary | ICD-10-CM

## 2017-04-16 DIAGNOSIS — C3432 Malignant neoplasm of lower lobe, left bronchus or lung: Secondary | ICD-10-CM

## 2017-04-16 DIAGNOSIS — R05 Cough: Secondary | ICD-10-CM | POA: Diagnosis not present

## 2017-04-16 DIAGNOSIS — I700 Atherosclerosis of aorta: Secondary | ICD-10-CM | POA: Diagnosis not present

## 2017-04-16 DIAGNOSIS — R5383 Other fatigue: Secondary | ICD-10-CM | POA: Insufficient documentation

## 2017-04-16 DIAGNOSIS — Z87440 Personal history of urinary (tract) infections: Secondary | ICD-10-CM | POA: Insufficient documentation

## 2017-04-16 DIAGNOSIS — R300 Dysuria: Secondary | ICD-10-CM | POA: Insufficient documentation

## 2017-04-16 DIAGNOSIS — J449 Chronic obstructive pulmonary disease, unspecified: Secondary | ICD-10-CM | POA: Diagnosis not present

## 2017-04-16 DIAGNOSIS — E538 Deficiency of other specified B group vitamins: Secondary | ICD-10-CM | POA: Insufficient documentation

## 2017-04-16 DIAGNOSIS — Z853 Personal history of malignant neoplasm of breast: Secondary | ICD-10-CM | POA: Insufficient documentation

## 2017-04-16 DIAGNOSIS — X58XXXS Exposure to other specified factors, sequela: Secondary | ICD-10-CM | POA: Insufficient documentation

## 2017-04-16 DIAGNOSIS — Z9223 Personal history of estrogen therapy: Secondary | ICD-10-CM | POA: Insufficient documentation

## 2017-04-16 DIAGNOSIS — R531 Weakness: Secondary | ICD-10-CM | POA: Diagnosis not present

## 2017-04-16 DIAGNOSIS — Z95828 Presence of other vascular implants and grafts: Secondary | ICD-10-CM

## 2017-04-16 DIAGNOSIS — M549 Dorsalgia, unspecified: Secondary | ICD-10-CM | POA: Insufficient documentation

## 2017-04-16 DIAGNOSIS — R0902 Hypoxemia: Secondary | ICD-10-CM | POA: Insufficient documentation

## 2017-04-16 DIAGNOSIS — R062 Wheezing: Secondary | ICD-10-CM

## 2017-04-16 DIAGNOSIS — Z7952 Long term (current) use of systemic steroids: Secondary | ICD-10-CM | POA: Insufficient documentation

## 2017-04-16 DIAGNOSIS — Z170 Estrogen receptor positive status [ER+]: Secondary | ICD-10-CM | POA: Insufficient documentation

## 2017-04-16 DIAGNOSIS — I5022 Chronic systolic (congestive) heart failure: Secondary | ICD-10-CM | POA: Diagnosis not present

## 2017-04-16 DIAGNOSIS — Z9221 Personal history of antineoplastic chemotherapy: Secondary | ICD-10-CM | POA: Insufficient documentation

## 2017-04-16 DIAGNOSIS — M797 Fibromyalgia: Secondary | ICD-10-CM | POA: Insufficient documentation

## 2017-04-16 DIAGNOSIS — D649 Anemia, unspecified: Secondary | ICD-10-CM | POA: Insufficient documentation

## 2017-04-16 DIAGNOSIS — I110 Hypertensive heart disease with heart failure: Secondary | ICD-10-CM | POA: Insufficient documentation

## 2017-04-16 DIAGNOSIS — Z923 Personal history of irradiation: Secondary | ICD-10-CM

## 2017-04-16 DIAGNOSIS — N390 Urinary tract infection, site not specified: Secondary | ICD-10-CM

## 2017-04-16 DIAGNOSIS — F17210 Nicotine dependence, cigarettes, uncomplicated: Secondary | ICD-10-CM | POA: Insufficient documentation

## 2017-04-16 DIAGNOSIS — J159 Unspecified bacterial pneumonia: Secondary | ICD-10-CM | POA: Diagnosis not present

## 2017-04-16 DIAGNOSIS — J90 Pleural effusion, not elsewhere classified: Secondary | ICD-10-CM | POA: Diagnosis not present

## 2017-04-16 DIAGNOSIS — G893 Neoplasm related pain (acute) (chronic): Secondary | ICD-10-CM

## 2017-04-16 DIAGNOSIS — I10 Essential (primary) hypertension: Secondary | ICD-10-CM

## 2017-04-16 DIAGNOSIS — J440 Chronic obstructive pulmonary disease with acute lower respiratory infection: Secondary | ICD-10-CM | POA: Insufficient documentation

## 2017-04-16 DIAGNOSIS — Z5111 Encounter for antineoplastic chemotherapy: Secondary | ICD-10-CM | POA: Insufficient documentation

## 2017-04-16 DIAGNOSIS — R0781 Pleurodynia: Secondary | ICD-10-CM | POA: Insufficient documentation

## 2017-04-16 DIAGNOSIS — R918 Other nonspecific abnormal finding of lung field: Secondary | ICD-10-CM | POA: Diagnosis not present

## 2017-04-16 DIAGNOSIS — S22060A Wedge compression fracture of T7-T8 vertebra, initial encounter for closed fracture: Secondary | ICD-10-CM | POA: Diagnosis not present

## 2017-04-16 DIAGNOSIS — Z79899 Other long term (current) drug therapy: Secondary | ICD-10-CM | POA: Insufficient documentation

## 2017-04-16 DIAGNOSIS — B9629 Other Escherichia coli [E. coli] as the cause of diseases classified elsewhere: Secondary | ICD-10-CM

## 2017-04-16 DIAGNOSIS — Z1612 Extended spectrum beta lactamase (ESBL) resistance: Secondary | ICD-10-CM

## 2017-04-16 LAB — COMPREHENSIVE METABOLIC PANEL
ALT: 13 U/L — ABNORMAL LOW (ref 14–54)
AST: 18 U/L (ref 15–41)
Albumin: 3.4 g/dL — ABNORMAL LOW (ref 3.5–5.0)
Alkaline Phosphatase: 88 U/L (ref 38–126)
Anion gap: 9 (ref 5–15)
BUN: 21 mg/dL — ABNORMAL HIGH (ref 6–20)
CO2: 27 mmol/L (ref 22–32)
Calcium: 8.9 mg/dL (ref 8.9–10.3)
Chloride: 95 mmol/L — ABNORMAL LOW (ref 101–111)
Creatinine, Ser: 1.07 mg/dL — ABNORMAL HIGH (ref 0.44–1.00)
GFR calc Af Amer: 58 mL/min — ABNORMAL LOW (ref >60)
GFR calc non Af Amer: 50 mL/min — ABNORMAL LOW (ref >60)
Glucose, Bld: 137 mg/dL — ABNORMAL HIGH (ref 65–99)
Potassium: 4.1 mmol/L (ref 3.5–5.1)
Sodium: 131 mmol/L — ABNORMAL LOW (ref 135–145)
Total Bilirubin: 0.5 mg/dL (ref 0.3–1.2)
Total Protein: 6.7 g/dL (ref 6.5–8.1)

## 2017-04-16 LAB — URINALYSIS, COMPLETE (UACMP) WITH MICROSCOPIC
Bacteria, UA: NONE SEEN
Bilirubin Urine: NEGATIVE
GLUCOSE, UA: NEGATIVE mg/dL
HGB URINE DIPSTICK: NEGATIVE
KETONES UR: NEGATIVE mg/dL
Nitrite: NEGATIVE
PH: 6 (ref 5.0–8.0)
PROTEIN: NEGATIVE mg/dL
Specific Gravity, Urine: 1.014 (ref 1.005–1.030)

## 2017-04-16 LAB — CBC WITH DIFFERENTIAL/PLATELET
Basophils Absolute: 0.1 10*3/uL (ref 0–0.1)
Basophils Relative: 1 %
Eosinophils Absolute: 0.2 10*3/uL (ref 0–0.7)
Eosinophils Relative: 3 %
HCT: 31.5 % — ABNORMAL LOW (ref 35.0–47.0)
Hemoglobin: 10.8 g/dL — ABNORMAL LOW (ref 12.0–16.0)
Lymphocytes Relative: 14 %
Lymphs Abs: 0.9 10*3/uL — ABNORMAL LOW (ref 1.0–3.6)
MCH: 31.2 pg (ref 26.0–34.0)
MCHC: 34.3 g/dL (ref 32.0–36.0)
MCV: 90.9 fL (ref 80.0–100.0)
Monocytes Absolute: 0.2 10*3/uL (ref 0.2–0.9)
Monocytes Relative: 4 %
Neutro Abs: 5 10*3/uL (ref 1.4–6.5)
Neutrophils Relative %: 78 %
Platelets: 194 10*3/uL (ref 150–440)
RBC: 3.47 MIL/uL — ABNORMAL LOW (ref 3.80–5.20)
RDW: 15 % — ABNORMAL HIGH (ref 11.5–14.5)
WBC: 6.3 10*3/uL (ref 3.6–11.0)

## 2017-04-16 LAB — IRON AND TIBC
Iron: 23 ug/dL — ABNORMAL LOW (ref 28–170)
Saturation Ratios: 9 % — ABNORMAL LOW (ref 10.4–31.8)
TIBC: 260 ug/dL (ref 250–450)
UIBC: 237 ug/dL

## 2017-04-16 LAB — FERRITIN: Ferritin: 163 ng/mL (ref 11–307)

## 2017-04-16 LAB — TSH: TSH: 2.307 u[IU]/mL (ref 0.350–4.500)

## 2017-04-16 LAB — FOLATE: Folate: 25 ng/mL (ref >5.9)

## 2017-04-16 LAB — VITAMIN B12: Vitamin B-12: 254 pg/mL (ref 180–914)

## 2017-04-16 MED ORDER — SODIUM CHLORIDE 0.9% FLUSH
10.0000 mL | INTRAVENOUS | Status: DC | PRN
  Administered 2017-04-16: 10 mL via INTRAVENOUS
  Filled 2017-04-16: qty 10

## 2017-04-16 MED ORDER — DEXAMETHASONE SODIUM PHOSPHATE 100 MG/10ML IJ SOLN: Freq: Once | INTRAMUSCULAR | Status: DC

## 2017-04-16 MED ORDER — IPRATROPIUM-ALBUTEROL 0.5-2.5 (3) MG/3ML IN SOLN
3.0000 mL | Freq: Four times a day (QID) | RESPIRATORY_TRACT | Status: DC
Start: 2017-04-16 — End: 2017-04-16
  Administered 2017-04-16: 3 mL via RESPIRATORY_TRACT
  Filled 2017-04-16: qty 3

## 2017-04-16 MED ORDER — HYDROCODONE-ACETAMINOPHEN 5-325 MG PO TABS
2.0000 | ORAL_TABLET | Freq: Once | ORAL | Status: AC
  Administered 2017-04-16: 2 via ORAL
  Filled 2017-04-16: qty 2

## 2017-04-16 MED ORDER — SODIUM CHLORIDE 0.9 % IV SOLN
INTRAVENOUS | Status: DC
  Administered 2017-04-16: 11:00:00 via INTRAVENOUS
  Filled 2017-04-16 (×2): qty 1000

## 2017-04-16 MED ORDER — HEPARIN SOD (PORK) LOCK FLUSH 100 UNIT/ML IV SOLN
500.0000 [IU] | Freq: Once | INTRAVENOUS | Status: AC
  Administered 2017-04-16: 500 [IU] via INTRAVENOUS

## 2017-04-16 MED ORDER — DEXAMETHASONE SODIUM PHOSPHATE 10 MG/ML IJ SOLN
10.0000 mg | Freq: Once | INTRAMUSCULAR | Status: AC
  Administered 2017-04-16: 10 mg via INTRAVENOUS
  Filled 2017-04-16: qty 1

## 2017-04-16 MED ORDER — ALBUTEROL SULFATE HFA 108 (90 BASE) MCG/ACT IN AERS: 2.0000 | INHALATION_SPRAY | Freq: Four times a day (QID) | RESPIRATORY_TRACT | 2 refills | Status: DC | PRN

## 2017-04-16 MED ORDER — PREDNISONE 10 MG (21) PO TBPK: ORAL_TABLET | ORAL | 0 refills | Status: DC

## 2017-04-16 MED ORDER — LEVOFLOXACIN 500 MG PO TABS: 500.0000 mg | ORAL_TABLET | Freq: Every day | ORAL | 0 refills | Status: DC

## 2017-04-16 NOTE — Progress Notes (Signed)
Symptom Management Consult note [facility]  [phone] [phone]  Patient Care Team: Tracie Harrier, MD as PCP - General (Internal Medicine) Noreene Filbert, MD as Referring Physician (Radiation Oncology) Telford Nab, RN as Registered Nurse [name], MD as Consulting Physician (Pulmonary Disease)   Name of the patient: [name] [name]  [id number]  Nov 07, 1940   Date of visit: 04/16/17  Diagnosis- Metastatic Squamous Cell Carcinoma of the left lower lobe  Chief complaint/ Reason for visit- "not feeling well"  Heme/Onc history: Patient was last seen by primary medical oncologist Dr. Mike Gip on 04/09/2017 prior to a  cycle 2 carbo/Abraxane and Keytruda. At that time, she admitted to feeling better since chemotherapy was held. Her cough was slightly better but she continued to complain of nausea with certain foods and neuropathy of third through fifth digits on right hand only. Complained of scalp tenderness due to loss of hair. She denied B symptoms and her appetite remained well. She complained of difficulty urinating but denied dysuria. Exam was stable. Treatment was given. She was to return in one week for repeat labs and day 8 Abraxane only.  Initial diagnosis was identified in May 2018 from a chest CT with contrast revealing a mass in the left lower lobe. PET scan from June 2018 revealed left lower lobe lung lesion consistent with primary bronchogenic carcinoma. Bronchoscopy and biopsy performed on 07/01/2016. MRI of head on 07/07/2016 revealed no evidence of metastatic disease. Radiation performed on 07/22/2016 through 09/19/2016. She received 5 weeks of concurrent carbo/Taxol. Repeat chest CT from November 2018 revealed progressive collapse of left lower lobe. Repeat PET from January 2019 revealed interval response to therapy. Bronchoscopy from January 2019 revealed no evidence of Endo bronchial tumor. Bone scan from February  2019 revealed a subtle focus of uptake in the left fifth rib. Began carbo/Abraxane Keytruda on 03/19/2017 until current.  Interval history-  Patient complains of productive cough with sputum described as white.   Symptoms began 1 week ago.  The cough is associated with wheezing, shortness of breath and is aggravated by nothing  Associated symptoms include:shortness of breath, sputum production and wheezing.  Patient do not have new pets.  Patient does not have a history of asthma.  Patient does not have a history of environmental allergens.  Patient has not recent travel. Patient does have a history of smoking.  Patient has had a previous chest x-ray. Pleural effusion with left thoracentesis on 02/17/17 revealed approx 250 ml of yellow fluid removed.  ECOG FS:2 - Symptomatic, <50% confined to bed  Review of systems- Review of Systems  Constitutional: Positive for malaise/fatigue. Negative for chills, fever and weight loss.  HENT: Negative for congestion and ear pain.   Eyes: Negative.  Negative for blurred vision and double vision.  Respiratory: Positive for cough, sputum production, shortness of breath and wheezing.   Cardiovascular: Negative.  Negative for chest pain, palpitations, orthopnea, claudication and leg swelling.  Gastrointestinal: Negative.  Negative for abdominal pain, constipation, diarrhea, nausea and vomiting.  Genitourinary: Negative for dysuria, frequency, hematuria and urgency.  Musculoskeletal: Positive for back pain. Negative for falls.       Right sided rib pain  Skin: Negative.  Negative for rash.  Neurological: Positive for weakness. Negative for headaches.  Endo/Heme/Allergies: Negative.  Does not bruise/bleed easily.  Psychiatric/Behavioral: Negative.  Negative for depression. The patient is not nervous/anxious and does not have insomnia.      Current treatment- Day 1, Cycle 2 Carbo,  Abraxane and Keytruda  Allergies  Allergen Reactions  . Contrast Media  [Iodinated Diagnostic Agents] Other (See Comments)    Pt was sent to the ED following contrast media injection at [place]. Unknown reason. She has been premedicated since without complications. Pt to be premedicated prior to contrast media injections  . Ioxaglate Other (See Comments)    Pt was sent to the ED following contrast media injection at [place]. Unknown reason. She has been premedicated since without complications. Pt to be premedicated prior to contrast media injections  . Sulfa Antibiotics Other (See Comments)    Unknown  . Tetracycline Hives  . White Petrolatum Other (See Comments)    Blisters  . Amoxicillin-Pot Clavulanate Rash and Other (See Comments)    Blisters in mouth Has patient had a PCN reaction causing immediate rash, facial/tongue/throat swelling, SOB or lightheadedness with hypotension: No Has patient had a PCN reaction causing severe rash involving mucus membranes or skin necrosis: No Has patient had a PCN reaction that required hospitalization: No Has patient had a PCN reaction occurring within the last 10 years: No If all of the above answers are "NO", then may proceed with Cephalosporin use.   . Tape Rash     Past Medical History:  Diagnosis Date  . Asthma   . Breast cancer ([place]) 2009   left  . Cancer ([place])   . CHF (congestive heart failure) ([place])   . CHF (congestive heart failure) ([place])   . Chronic UTI   . COPD (chronic obstructive pulmonary disease) ([place])   . Dizziness   . Fibromyalgia   . Hypertension   . Neuropathy   . Personal history of tobacco use, presenting hazards to health 05/17/2015  . Polyp, larynx   . RA (rheumatoid arthritis) ([place])   . Sinus infection    recent  . Stumbling gait    to the left  . Supplemental oxygen dependent    2.5l     Past Surgical History:  Procedure Laterality Date  . ABDOMINAL HYSTERECTOMY    . BREAST LUMPECTOMY Left 2009   chemo and radiation  . CYST EXCISION Left 02/27/2015    Procedure: CYST REMOVAL;  Surgeon: Hessie Knows, MD;  Location: [facility] ORS;  Service: Orthopedics;  Laterality: Left;  . EYE MUSCLE SURGERY Right    13 surgeries  . FLEXIBLE BRONCHOSCOPY N/A 07/01/2016   Procedure: FLEXIBLE BRONCHOSCOPY;  Surgeon: Wilhelmina Mcardle, MD;  Location: [facility] ORS;  Service: Pulmonary;  Laterality: N/A;  . FLEXIBLE BRONCHOSCOPY N/A 02/09/2017   Procedure: FLEXIBLE BRONCHOSCOPY;  Surgeon: [name], MD;  Location: [facility] ORS;  Service: Pulmonary;  Laterality: N/A;  . PORTA CATH INSERTION N/A 03/04/2017   Procedure: PORTA CATH INSERTION;  Surgeon: [name], MD;  Location: [facility];  Service: Cardiovascular;  Laterality: N/A;  . THUMB ARTHROSCOPY Left     Social History   Socioeconomic History  . Marital status: Divorced    Spouse name: Not on file  . Number of children: Not on file  . Years of education: Not on file  . Highest education level: Not on file  Occupational History  . Not on file  Social Needs  . Financial resource strain: Not on file  . Food insecurity:    Worry: Not on file    Inability: Not on file  . Transportation needs:    Medical: Not on file    Non-medical: Not on file  Tobacco Use  . Smoking status: Current Every  Day Smoker    Packs/day: 1.00    Years: 40.00    Pack years: 40.00    Types: Cigarettes  . Smokeless tobacco: Never Used  Substance and Sexual Activity  . Alcohol use: No  . Drug use: No  . Sexual activity: Not on file  Lifestyle  . Physical activity:    Days per week: Not on file    Minutes per session: Not on file  . Stress: Not on file  Relationships  . Social connections:    Talks on phone: Not on file    Gets together: Not on file    Attends religious service: Not on file    Active member of club or organization: Not on file    Attends meetings of clubs or organizations: Not on file    Relationship status: Not on file  . Intimate partner violence:    Fear of current or ex partner: Not  on file    Emotionally abused: Not on file    Physically abused: Not on file    Forced sexual activity: Not on file  Other Topics Concern  . Not on file  Social History Narrative  . Not on file    Family History  Problem Relation Age of Onset  . Diabetes Father   . Stroke Father   . Heart attack Father   . CAD Sister      Current Outpatient Medications:  .  albuterol (PROVENTIL HFA;VENTOLIN HFA) 108 (90 Base) MCG/ACT inhaler, Inhale 1-2 puffs into the lungs every 6 (six) hours as needed for wheezing or shortness of breath., Disp: 1 Inhaler, Rfl: 2 .  Calcium-Vitamin D 600-200 MG-UNIT tablet, Take 1 tablet by mouth daily. , Disp: , Rfl:  .  carvedilol (COREG) 12.5 MG tablet, Take 1 tablet (12.5 mg total) by mouth 2 (two) times daily with a meal., Disp: 60 tablet, Rfl: 0 .  cholecalciferol (VITAMIN D) 1000 units tablet, Take 1,000 Units by mouth daily., Disp: , Rfl:  .  clonazePAM (KLONOPIN) 1 MG tablet, Take 1 mg by mouth 2 (two) times daily as needed for anxiety. , Disp: , Rfl:  .  dexamethasone (DECADRON) 4 MG tablet, Take 2 tablets by mouth once a day on the day after chemotherapy and then take 2 tablets two times a day for 2 days. Take with food., Disp: 30 tablet, Rfl: 1 .  gabapentin (NEURONTIN) 600 MG tablet, Take 600 mg by mouth 3 (three) times daily. , Disp: , Rfl:  .  HYDROcodone-acetaminophen (NORCO/VICODIN) 5-325 MG tablet, Take 1 tablet by mouth 2 (two) times daily. , Disp: , Rfl:  .  imipramine (TOFRANIL) 25 MG tablet, Take 25 mg by mouth at bedtime. , Disp: , Rfl:  .  lidocaine-prilocaine (EMLA) cream, Apply 1 application topically as needed., Disp: 30 g, Rfl: 3 .  LORazepam (ATIVAN) 0.5 MG tablet, Take 1 tablet (0.5 mg total) by mouth every 6 (six) hours as needed (Nausea or vomiting)., Disp: 30 tablet, Rfl: 0 .  Multiple Vitamin (MULTIVITAMIN) capsule, Take 1 capsule by mouth daily., Disp: , Rfl:  .  nitroGLYCERIN (NITROSTAT) 0.4 MG SL tablet, Place 0.4 mg under the  tongue every 5 (five) minutes as needed for chest pain. , Disp: , Rfl:  .  ondansetron (ZOFRAN) 8 MG tablet, Take 1 tablet (8 mg total) by mouth 2 (two) times daily as needed. Start on the third day after chemotherapy., Disp: 30 tablet, Rfl: 1 .  Tiotropium Bromide Monohydrate (SPIRIVA RESPIMAT) 2.5  MCG/ACT AERS, Inhale 2.5 mcg into the lungs 2 (two) times daily., Disp: 1 Inhaler, Rfl: 0 .  traMADol (ULTRAM) 50 MG tablet, Take 100 mg by mouth daily. , Disp: , Rfl:  .  vitamin C (ASCORBIC ACID) 500 MG tablet, Take 500 mg by mouth daily., Disp: , Rfl:  .  ZINC SULFATE PO, Take 1 tablet by mouth daily. , Disp: , Rfl:  .  zolpidem (AMBIEN) 5 MG tablet, Take 5 mg by mouth at bedtime., Disp: , Rfl:  .  albuterol (PROVENTIL HFA;VENTOLIN HFA) 108 (90 Base) MCG/ACT inhaler, Inhale 2 puffs into the lungs every 6 (six) hours as needed for wheezing or shortness of breath., Disp: 1 Inhaler, Rfl: 2 .  levofloxacin (LEVAQUIN) 500 MG tablet, Take 1 tablet (500 mg total) by mouth daily., Disp: 10 tablet, Rfl: 0 .  predniSONE (STERAPRED UNI-PAK 21 TAB) 10 MG (21) TBPK tablet, Take as directed., Disp: 21 tablet, Rfl: 0 No current facility-administered medications for this visit.   Facility-Administered Medications Ordered in Other Visits:  .  0.9 %  sodium chloride infusion, , Intravenous, Continuous, Kayelee Herbig E, NP, Last Rate: 999 mL/hr at 04/16/17 1120 .  ipratropium-albuterol (DUONEB) 0.5-2.5 (3) MG/3ML nebulizer solution 3 mL, 3 mL, Nebulization, Q6H, Evamae Rowen E, NP, 3 mL at 04/16/17 1357  Physical exam:  Vitals:   04/16/17 1055 04/16/17 1114  BP: 137/81   Pulse: (!) 102   Resp: 20   Temp: 98.4 F (36.9 C)   TempSrc: Tympanic   SpO2: (!) 89% (!) 89%  Weight: 117 lb (53.1 kg)    Physical Exam  Constitutional: She is oriented to person, place, and time and well-developed, well-nourished, and in no distress. Vital signs are normal.  HENT:  Head: Normocephalic and atraumatic.  Eyes: Pupils  are equal, round, and reactive to light.  Neck: Normal range of motion.  Cardiovascular: Normal rate, regular rhythm and normal heart sounds.  No murmur heard. Pulmonary/Chest: Effort normal. She has decreased breath sounds. She has wheezes in the right upper field, the right lower field, the left upper field and the left lower field. She has rhonchi in the right upper field, the right lower field, the left upper field and the left lower field. She has rales in the right upper field, the right lower field, the left upper field and the left lower field.  Abdominal: Soft. Normal appearance and bowel sounds are normal. She exhibits no distension. There is no tenderness.  Musculoskeletal: Normal range of motion. She exhibits no edema.       Arms: Neurological: She is alert and oriented to person, place, and time. Gait normal.  Skin: Skin is warm and dry. No rash noted.  Psychiatric: Mood, memory, affect and judgment normal.     CMP Latest Ref Rng & Units 04/16/2017  Glucose 65 - 99 mg/dL 137(H)  BUN 6 - 20 mg/dL 21(H)  Creatinine 0.44 - 1.00 mg/dL 1.07(H)  Sodium 135 - 145 mmol/L 131(L)  Potassium 3.5 - 5.1 mmol/L 4.1  Chloride 101 - 111 mmol/L 95(L)  CO2 22 - 32 mmol/L 27  Calcium 8.9 - 10.3 mg/dL 8.9  Total Protein 6.5 - 8.1 g/dL 6.7  Total Bilirubin 0.3 - 1.2 mg/dL 0.5  Alkaline Phos 38 - 126 U/L 88  AST 15 - 41 U/L 18  ALT 14 - 54 U/L 13(L)   CBC Latest Ref Rng & Units 04/16/2017  WBC 3.6 - 11.0 K/uL 6.3  Hemoglobin 12.0 - 16.0 g/dL  10.8(L)  Hematocrit 35.0 - 47.0 % 31.5(L)  Platelets 150 - 440 K/uL 194    No images are attached to the encounter.  Dg Chest 2 View  Result Date: 04/16/2017 CLINICAL DATA:  Cough.  Back and rib pain. EXAM: CHEST - 2 VIEW COMPARISON:  02/17/2017. FINDINGS: PowerPort catheter with tip over cavoatrial junction. Heart size stable. Normal pulmonary vascularity. Left base infiltrate and/or and left-sided pleural effusion. Small right pleural effusion. No  pneumothorax. IMPRESSION: 1.  PowerPort catheter noted with tip over the right atrium. 2. Left base infiltrate and left-sided pleural effusion. Small right pleural effusion. Electronically Signed   By: Marcello Moores  Register   On: 04/16/2017 13:08     Assessment and plan- Patient is a 77 y.o. female who presents for "not feeling well". She was here for cycle 2, Day 8 Abraxane only. Treatment held due to progressive shortness of breath, productive cough and right sided rib pain.  1. Metastatic Squamous Cell Carcinoma of the left lower lobe: No treatment today. Progressive shortness of breath. She is scheduled to return for labs on 04/23/17. She has follow-up with Dr. Mike Gip with labs and possible treatment on 04/30/2017.  2. Shortness of breath/Rhnochi/Wheezing/Cough:  STAT Chest X-ray. 1 liter IV fluids for dehydration (Hyponatremia and tachycardia). Chest x-ray revealed left base infiltrate and left-sided pleural effusion. Small right pleural effusion. Consulted Dr. Mike Gip and she recommended inpatient admission.   3. Right sided rib pain that radiates to back: Gave two 5-325 Norco during infusion for 10/10 rib pain.    4. Hypoxia: Initial oxygen level 89% without oxygen. Placed on 2 L and increased to 99%. Vitals signs stable. On intermittent oxygen at home. Does not have supplemental oxygen with her today. Will also give Duo Neb while in clinic.   Patient was reevaluated after returning from radiology for chest x-ray. Patient vital signs remained stable and heart rate returned to baseline and was 80 bpm. Oxygen saturations were stable on 2 L of oxygen and 98%. She remained afebrile. After nebulizer treatment patient's lungs continued to have scattering wheezing with rales of left lung but improved. Patient is refusing inpatient admission and would like to go home. Dr. Mike Gip notified.  Prescription sent in for Levaquin 500 mg daily for 10 days. Albuterol inhaler and steroid Dosepak were also  prescribed. She will return to clinic on Monday for follow-up with NP. Patient knows to let clinic now she develops a fever or worsening shortness of breath. We discussed in detail side effects of each medication. Patient states she has taken Levaquin in the past and has not had any problems.     Visit Diagnosis 1. Shortness of breath   2. Dehydration   3. Community acquired bacterial pneumonia     Patient expressed understanding and was in agreement with this plan. She also understands that She can call clinic at any time with any questions, concerns, or complaints.   Greater than 50% was spent in counseling and coordination of care with this patient including but not limited to discussion of the relevant topics above (See A&P) including, but not limited to diagnosis and management of acute and chronic medical conditions.    Faythe Casa, AGNP-C [facility] [place]- [phone] Pager- [phone] 04/16/2017 3:05 PM

## 2017-04-16 NOTE — Progress Notes (Signed)
Ms. Roesler here for Abraxane treatment. She expressed that she has had a cough with severe back and rib pain. She also has intermittent shortness of breath, and generalized weakness. Vital signs stable. Per Gaspar Bidding, NP patient should go to symptom management. No abraxane treatment today.

## 2017-04-16 NOTE — Addendum Note (Signed)
Addended by: Livia Snellen on: 04/16/2017 03:36 PM   Modules accepted: Orders, SmartSet

## 2017-04-17 ENCOUNTER — Other Ambulatory Visit: Payer: Self-pay | Admitting: Hematology and Oncology

## 2017-04-17 DIAGNOSIS — E538 Deficiency of other specified B group vitamins: Secondary | ICD-10-CM

## 2017-04-17 LAB — URINE CULTURE

## 2017-04-20 ENCOUNTER — Inpatient Hospital Stay: Payer: Medicare HMO | Admitting: Nurse Practitioner

## 2017-04-20 ENCOUNTER — Telehealth: Payer: Self-pay | Admitting: Nurse Practitioner

## 2017-04-20 NOTE — Telephone Encounter (Signed)
Patient stating she feels her symptoms have improved significantly on steroids and antibiotics. She requests to have appointment cancelled this morning as she is feeling better and does not have transportation. Patient scheduled to have labs for Dr. Mike Gip drawn on 04/23/17. Can see her then if symptoms progress or do not continue to improve. Patient thanked for call.

## 2017-04-23 ENCOUNTER — Inpatient Hospital Stay: Payer: Medicare HMO

## 2017-04-23 DIAGNOSIS — C3432 Malignant neoplasm of lower lobe, left bronchus or lung: Secondary | ICD-10-CM

## 2017-04-23 DIAGNOSIS — E538 Deficiency of other specified B group vitamins: Secondary | ICD-10-CM

## 2017-04-23 LAB — BASIC METABOLIC PANEL
Anion gap: 10 (ref 5–15)
BUN: 27 mg/dL — ABNORMAL HIGH (ref 6–20)
CO2: 27 mmol/L (ref 22–32)
Calcium: 9 mg/dL (ref 8.9–10.3)
Chloride: 93 mmol/L — ABNORMAL LOW (ref 101–111)
Creatinine, Ser: 1.07 mg/dL — ABNORMAL HIGH (ref 0.44–1.00)
GFR calc Af Amer: 58 mL/min — ABNORMAL LOW (ref >60)
GFR calc non Af Amer: 50 mL/min — ABNORMAL LOW (ref >60)
Glucose, Bld: 101 mg/dL — ABNORMAL HIGH (ref 65–99)
Potassium: 4.3 mmol/L (ref 3.5–5.1)
Sodium: 130 mmol/L — ABNORMAL LOW (ref 135–145)

## 2017-04-23 LAB — CBC WITH DIFFERENTIAL/PLATELET
Basophils Absolute: 0 10*3/uL (ref 0–0.1)
Basophils Relative: 1 %
Eosinophils Absolute: 0 10*3/uL (ref 0–0.7)
Eosinophils Relative: 0 %
HCT: 33.8 % — ABNORMAL LOW (ref 35.0–47.0)
Hemoglobin: 11.6 g/dL — ABNORMAL LOW (ref 12.0–16.0)
Lymphocytes Relative: 11 %
Lymphs Abs: 0.6 10*3/uL — ABNORMAL LOW (ref 1.0–3.6)
MCH: 31.2 pg (ref 26.0–34.0)
MCHC: 34.4 g/dL (ref 32.0–36.0)
MCV: 90.6 fL (ref 80.0–100.0)
Monocytes Absolute: 1.1 10*3/uL — ABNORMAL HIGH (ref 0.2–0.9)
Monocytes Relative: 21 %
Neutro Abs: 3.5 10*3/uL (ref 1.4–6.5)
Neutrophils Relative %: 67 %
Platelets: 219 10*3/uL (ref 150–440)
RBC: 3.73 MIL/uL — ABNORMAL LOW (ref 3.80–5.20)
RDW: 15.9 % — ABNORMAL HIGH (ref 11.5–14.5)
WBC: 5.2 10*3/uL (ref 3.6–11.0)

## 2017-04-23 LAB — MAGNESIUM: Magnesium: 2 mg/dL (ref 1.7–2.4)

## 2017-04-24 ENCOUNTER — Other Ambulatory Visit: Payer: Self-pay | Admitting: *Deleted

## 2017-04-24 ENCOUNTER — Telehealth: Payer: Self-pay | Admitting: *Deleted

## 2017-04-24 NOTE — Progress Notes (Deleted)
  04/27/2017 8:53 AM   [name] [name] 01/13/1941 [id number]  Referring provider: [name], [name], MD [address] [facility] [place], [place] [address]  No chief complaint on file.   HPI: Patient is a 77-year-old Caucasian female who is referred to us by Dr. [name] for recurrent urinary tract infections with ESBL E.Coli.  She is currently undergoing treatment with Abraxan and Keytruda for metastatic squamous cell carcinoma of the left lower lobe of her lung.    Patient states that she has had *** urinary tract infections over the last year.  Reviewing her records,  she has had three UTI's since March alone.  Two of these infections have been for ESBL E. Coli and one for Diphtheroids.    Her symptoms with a urinary tract infection consist of ***.  She denies/endorses dysuria, gross hematuria, suprapubic pain, back pain, abdominal pain or flank pain associated with UTI's.    She has not had any recent fevers, chills, nausea or vomiting associated with UTI's.   Her baseline urinary symptoms are ***  She does/does not have a history of nephrolithiasis, GU surgery or GU trauma. ***  She is/is not sexually active.  She has/has not noted a correlation with her urinary tract infections and sexual intercourse.  ***   She does/does not engage in anal sex. ***  She is/ is not having anal to vaginal sex.*** She is/is not voiding before and after sex. ***     She is postmenopausal.   She has a history of breast cancer.    She admits to/denies constipation and/or diarrhea. ***  She does/does not engage in good perineal hygiene. She does/does not take tub baths. ***  She has/does not have incontinence.  She is using incontinence pads. ***  She is having/ not having pain with bladder filling.  ***  RUS on 03/05/2016 noted bilateral increased renal echogenicity and renal cortical thinning with irregularity. Findings consistent chronic medical renal disease with  atrophy and scarring. Renal vascular calcification noted consistent with renal atherosclerotic vascular disease.  No acute abnormality.   She is drinking *** of water daily.    Risk factors for recurrent UTI's: vaginal atrophy,   Reviewed referral notes.    PMH: Past Medical History:  Diagnosis Date  . Asthma   . Breast cancer (HCC) 2009   left  . Cancer (HCC)   . CHF (congestive heart failure) (HCC)   . CHF (congestive heart failure) (HCC)   . Chronic UTI   . COPD (chronic obstructive pulmonary disease) (HCC)   . Dizziness   . Fibromyalgia   . Hypertension   . Neuropathy   . Personal history of tobacco use, presenting hazards to health 05/17/2015  . Polyp, larynx   . RA (rheumatoid arthritis) (HCC)   . Sinus infection    recent  . Stumbling gait    to the left  . Supplemental oxygen dependent    2.5l    Surgical History: Past Surgical History:  Procedure Laterality Date  . ABDOMINAL HYSTERECTOMY    . BREAST LUMPECTOMY Left 2009   chemo and radiation  . CYST EXCISION Left 02/27/2015   Procedure: CYST REMOVAL;  Surgeon: [name], MD;  Location: [facility];  Service: Orthopedics;  Laterality: Left;  . EYE MUSCLE SURGERY Right    13 surgeries  . FLEXIBLE BRONCHOSCOPY N/A 07/01/2016   Procedure: FLEXIBLE BRONCHOSCOPY;  Surgeon: [name], [name], MD;  Location: [facility];  Service: Pulmonary;  Laterality: N/A;  .   FLEXIBLE BRONCHOSCOPY N/A 02/09/2017   Procedure: FLEXIBLE BRONCHOSCOPY;  Surgeon: [name], MD;  Location: [facility];  Service: Pulmonary;  Laterality: N/A;  . PORTA CATH INSERTION N/A 03/04/2017   Procedure: PORTA CATH INSERTION;  Surgeon: [name], MD;  Location: [facility];  Service: Cardiovascular;  Laterality: N/A;  . THUMB ARTHROSCOPY Left     Home Medications:  Allergies as of 04/27/2017      Reactions   Contrast Media [iodinated Diagnostic Agents] Other (See Comments)   Pt was sent to the ED following contrast media injection  at [place]. Unknown reason. She has been premedicated since without complications. Pt to be premedicated prior to contrast media injections   Ioxaglate Other (See Comments)   Pt was sent to the ED following contrast media injection at [place]. Unknown reason. She has been premedicated since without complications. Pt to be premedicated prior to contrast media injections   Sulfa Antibiotics Other (See Comments)   Unknown   Tetracycline Hives   White Petrolatum Other (See Comments)   Blisters   Amoxicillin-pot Clavulanate Rash, Other (See Comments)   Blisters in mouth Has patient had a PCN reaction causing immediate rash, facial/tongue/throat swelling, SOB or lightheadedness with hypotension: No Has patient had a PCN reaction causing severe rash involving mucus membranes or skin necrosis: No Has patient had a PCN reaction that required hospitalization: No Has patient had a PCN reaction occurring within the last 10 years: No If all of the above answers are "NO", then may proceed with Cephalosporin use.   Tape Rash      Medication List        Accurate as of 04/24/17  8:53 AM. Always use your most recent med list.          albuterol 108 (90 Base) MCG/ACT inhaler Commonly known as:  PROVENTIL HFA;VENTOLIN HFA Inhale 1-2 puffs into the lungs every 6 (six) hours as needed for wheezing or shortness of breath.   albuterol 108 (90 Base) MCG/ACT inhaler Commonly known as:  PROVENTIL HFA;VENTOLIN HFA Inhale 2 puffs into the lungs every 6 (six) hours as needed for wheezing or shortness of breath.   Calcium-Vitamin D 600-200 MG-UNIT tablet Take 1 tablet by mouth daily.   carvedilol 12.5 MG tablet Commonly known as:  COREG Take 1 tablet (12.5 mg total) by mouth 2 (two) times daily with a meal.   cholecalciferol 1000 units tablet Commonly known as:  VITAMIN D Take 1,000 Units by mouth daily.   clonazePAM 1 MG tablet Commonly known as:  KLONOPIN Take 1 mg by mouth 2  (two) times daily as needed for anxiety.   dexamethasone 4 MG tablet Commonly known as:  DECADRON Take 2 tablets by mouth once a day on the day after chemotherapy and then take 2 tablets two times a day for 2 days. Take with food.   gabapentin 600 MG tablet Commonly known as:  NEURONTIN Take 600 mg by mouth 3 (three) times daily.   HYDROcodone-acetaminophen 5-325 MG tablet Commonly known as:  NORCO/VICODIN Take 1 tablet by mouth 2 (two) times daily.   imipramine 25 MG tablet Commonly known as:  TOFRANIL Take 25 mg by mouth at bedtime.   levofloxacin 500 MG tablet Commonly known as:  LEVAQUIN Take 1 tablet (500 mg total) by mouth daily.   lidocaine-prilocaine cream Commonly known as:  EMLA Apply 1 application topically as needed.   LORazepam 0.5 MG tablet Commonly known as:  ATIVAN Take 1 tablet (  0.5 mg total) by mouth every 6 (six) hours as needed (Nausea or vomiting).   multivitamin capsule Take 1 capsule by mouth daily.   NITROSTAT 0.4 MG SL tablet Generic drug:  nitroGLYCERIN Place 0.4 mg under the tongue every 5 (five) minutes as needed for chest pain.   ondansetron 8 MG tablet Commonly known as:  ZOFRAN Take 1 tablet (8 mg total) by mouth 2 (two) times daily as needed. Start on the third day after chemotherapy.   predniSONE 10 MG (21) Tbpk tablet Commonly known as:  STERAPRED UNI-PAK 21 TAB Take as directed.   Tiotropium Bromide Monohydrate 2.5 MCG/ACT Aers Commonly known as:  SPIRIVA RESPIMAT Inhale 2.5 mcg into the lungs 2 (two) times daily.   traMADol 50 MG tablet Commonly known as:  ULTRAM Take 100 mg by mouth daily.   vitamin C 500 MG tablet Commonly known as:  ASCORBIC ACID Take 500 mg by mouth daily.   ZINC SULFATE PO Take 1 tablet by mouth daily.   zolpidem 5 MG tablet Commonly known as:  AMBIEN Take 5 mg by mouth at bedtime.       Allergies:  Allergies  Allergen Reactions  . Contrast Media [Iodinated Diagnostic Agents] Other (See  Comments)    Pt was sent to the ED following contrast media injection at [place]. Unknown reason. She has been premedicated since without complications. Pt to be premedicated prior to contrast media injections  . Ioxaglate Other (See Comments)    Pt was sent to the ED following contrast media injection at [place]. Unknown reason. She has been premedicated since without complications. Pt to be premedicated prior to contrast media injections  . Sulfa Antibiotics Other (See Comments)    Unknown  . Tetracycline Hives  . White Petrolatum Other (See Comments)    Blisters  . Amoxicillin-Pot Clavulanate Rash and Other (See Comments)    Blisters in mouth Has patient had a PCN reaction causing immediate rash, facial/tongue/throat swelling, SOB or lightheadedness with hypotension: No Has patient had a PCN reaction causing severe rash involving mucus membranes or skin necrosis: No Has patient had a PCN reaction that required hospitalization: No Has patient had a PCN reaction occurring within the last 10 years: No If all of the above answers are "NO", then may proceed with Cephalosporin use.   . Tape Rash    Family History: Family History  Problem Relation Age of Onset  . Diabetes Father   . Stroke Father   . Heart attack Father   . CAD Sister     Social History:  reports that she has been smoking cigarettes.  She has a 40.00 pack-year smoking history. She has never used smokeless tobacco. She reports that she does not drink alcohol or use drugs.  ROS:                                        Physical Exam: There were no vitals taken for this visit.  Constitutional:  Well nourished. Alert and oriented, No acute distress. HEENT: [place] AT, moist mucus membranes.  Trachea midline, no masses. Cardiovascular: No clubbing, cyanosis, or edema. Respiratory: Normal respiratory effort, no increased work of breathing. GI: Abdomen is soft, non tender, non  distended, no abdominal masses. Liver and spleen not palpable.  No hernias appreciated.  Stool sample for occult testing is not indicated.   GU: No CVA tenderness.  No bladder fullness or masses.  Normal external genitalia, normal pubic hair distribution, no lesions.  Normal urethral meatus, no lesions, no prolapse, no discharge.   No urethral masses, tenderness and/or tenderness. No bladder fullness, tenderness or masses. Normal vagina mucosa, good estrogen effect, no discharge, no lesions, good pelvic support, no cystocele or rectocele noted.  No cervical motion tenderness.  Uterus is freely mobile and non-fixed.  No adnexal/parametria masses or tenderness noted.  Anus and perineum are without rashes or lesions.   *** Skin: No rashes, bruises or suspicious lesions. Lymph: No cervical or inguinal adenopathy. Neurologic: Grossly intact, no focal deficits, moving all 4 extremities. Psychiatric: Normal mood and affect.  Laboratory Data: Lab Results  Component Value Date   WBC 5.2 04/23/2017   HGB 11.6 (L) 04/23/2017   HCT 33.8 (L) 04/23/2017   MCV 90.6 04/23/2017   PLT 219 04/23/2017    Lab Results  Component Value Date   CREATININE 1.07 (H) 04/23/2017    No results found for: PSA  No results found for: TESTOSTERONE  Lab Results  Component Value Date   HGBA1C 5.8 (H) 11/29/2015    Lab Results  Component Value Date   TSH 2.307 04/16/2017       Component Value Date/Time   CHOL 181 11/27/2015 0610   HDL 49 11/27/2015 0610   CHOLHDL 3.7 11/27/2015 0610   VLDL 21 11/27/2015 0610   LDLCALC 111 (H) 11/27/2015 0610    Lab Results  Component Value Date   AST 18 04/16/2017   Lab Results  Component Value Date   ALT 13 (L) 04/16/2017   No components found for: ALKALINEPHOPHATASE No components found for: BILIRUBINTOTAL  No results found for: ESTRADIOL  Urinalysis    Component Value Date/Time   COLORURINE YELLOW (A) 04/16/2017 0935   APPEARANCEUR HAZY (A) 04/16/2017 0935    APPEARANCEUR Cloudy 10/11/2012 0040   LABSPEC 1.014 04/16/2017 0935   LABSPEC 1.014 10/11/2012 0040   PHURINE 6.0 04/16/2017 0935   GLUCOSEU NEGATIVE 04/16/2017 0935   GLUCOSEU Negative 10/11/2012 0040   HGBUR NEGATIVE 04/16/2017 0935   BILIRUBINUR NEGATIVE 04/16/2017 0935   BILIRUBINUR Negative 10/11/2012 0040   KETONESUR NEGATIVE 04/16/2017 0935   PROTEINUR NEGATIVE 04/16/2017 0935   NITRITE NEGATIVE 04/16/2017 0935   LEUKOCYTESUR LARGE (A) 04/16/2017 0935   LEUKOCYTESUR 3+ 10/11/2012 0040    I have reviewed the labs.   Pertinent Imaging: CLINICAL DATA:  Renal insufficiency.  EXAM: RENAL / URINARY TRACT ULTRASOUND COMPLETE  COMPARISON:  CT 10/03/2015 .  FINDINGS: Right Kidney:  Length: 9.9 cm. Increased echogenicity. Renal cortical thinning and irregularity. No hydronephrosis. Vascular calcification.  Left Kidney:  Length: 9.5 cm. Increased echogenicity. Renal cortical thinning and irregularity. No hydronephrosis. Vascular calcification.  Bladder:  Appears normal for degree of bladder distention.  IMPRESSION: 1. Bilateral increased renal echogenicity and renal cortical thinning with irregularity. Findings consistent chronic medical renal disease with atrophy and scarring. Renal vascular calcification noted consistent with renal atherosclerotic vascular disease.  2. No acute abnormality.   Electronically Signed   By: Thomas  Register   On: 03/05/2016 10:19  I have independently reviewed the films.    Assessment & Plan:  ***  1. Recurrent UTI's  - criteria for recurrent UTI has been met with 2 or more infections in 6 months or 3 or greater infections in one year   - patient is instructed to increase their water intake until the urine is pale yellow or clear (10 to 12 cups   daily) ***  - patient is instructed to take probiotics (yogurt, oral pills or vaginal suppositories), take cranberry pills or drink the juice and Vitamin C 1,000 mg  daily to acidify the urine ***  - if using tampons, she should remove them prior to urinating and change them often ***  - avoid soaking in tubs and wipe front to back after urinating ***  - benefit from core strengthening exercises has been seen.  We can refer to PT if they desire ***  - advised them to have CATH UA's for urinalysis and culture to prevent skin, vaginal and/or rectal contamination of the specimen  - reviewed symptoms of UTI (fevers, chills, gross hematuria, mental status changes, dysuria, suprapubic pain, back pain and/or sudden worsening of urinary symptoms) and advised not to have urine checked or be treated for UTI if not experiencing symptoms  - discussed antibiotic stewardship with the patient - explained the risk of increasing risk of antibiotic resistance with continuous exposure to antibiotics, renal failure, hypoglycemia, C. Diff infection, allergic reactions, etc.    2. Vaginal atrophy Not a candidate for vaginal estrogen due to her history of breast cancer                                                 No follow-ups on file.  These notes generated with voice recognition software. I apologize for typographical errors.  Zara Council, PA-C  [facility] [address]  [place], [place] [address] [phone]

## 2017-04-24 NOTE — Telephone Encounter (Signed)
Called patient to inform her that her sodium is low.  Advised patient to eat something with salt in it to hopefully increase her sodium levels.  She states she ate some potato chips yesterday.  She understands she needs to increase salt intake.  She further stated to me that she is having pain so severe that she has to bend over to walk.  She feels like she needs to be seen.  States her pain medictiton is no longer covering her pain.  I offered the patient an appointment to see our NP today, however she declined stating she has no one to drive her here and she is afraid to drive herself. I offered her an appointment to see NP on Monday, April 15 in the early afternoon.  Patient has an appointment with Dr Tonye Pearson oat 10:45 that day.  She did agree to come on Monday afternoon.  Will have scheduling set up appointment for Monday.

## 2017-04-27 ENCOUNTER — Inpatient Hospital Stay (HOSPITAL_BASED_OUTPATIENT_CLINIC_OR_DEPARTMENT_OTHER): Payer: Medicare HMO | Admitting: Oncology

## 2017-04-27 ENCOUNTER — Ambulatory Visit: Payer: Self-pay | Admitting: Urology

## 2017-04-27 ENCOUNTER — Encounter: Payer: Self-pay | Admitting: Nurse Practitioner

## 2017-04-27 VITALS — BP 107/69 | HR 108 | Temp 99.5°F | Resp 22 | Wt 114.9 lb

## 2017-04-27 DIAGNOSIS — M069 Rheumatoid arthritis, unspecified: Secondary | ICD-10-CM

## 2017-04-27 DIAGNOSIS — F17210 Nicotine dependence, cigarettes, uncomplicated: Secondary | ICD-10-CM | POA: Diagnosis not present

## 2017-04-27 DIAGNOSIS — Z9221 Personal history of antineoplastic chemotherapy: Secondary | ICD-10-CM

## 2017-04-27 DIAGNOSIS — Z7952 Long term (current) use of systemic steroids: Secondary | ICD-10-CM | POA: Diagnosis not present

## 2017-04-27 DIAGNOSIS — Z923 Personal history of irradiation: Secondary | ICD-10-CM

## 2017-04-27 DIAGNOSIS — Z9981 Dependence on supplemental oxygen: Secondary | ICD-10-CM | POA: Diagnosis not present

## 2017-04-27 DIAGNOSIS — C3432 Malignant neoplasm of lower lobe, left bronchus or lung: Secondary | ICD-10-CM

## 2017-04-27 DIAGNOSIS — R0781 Pleurodynia: Secondary | ICD-10-CM | POA: Diagnosis not present

## 2017-04-27 DIAGNOSIS — I10 Essential (primary) hypertension: Secondary | ICD-10-CM | POA: Diagnosis not present

## 2017-04-27 DIAGNOSIS — J159 Unspecified bacterial pneumonia: Secondary | ICD-10-CM | POA: Diagnosis not present

## 2017-04-27 DIAGNOSIS — Z79899 Other long term (current) drug therapy: Secondary | ICD-10-CM

## 2017-04-27 MED ORDER — LIDOCAINE 5 % EX PTCH: 1.0000 | MEDICATED_PATCH | CUTANEOUS | 0 refills | Status: DC

## 2017-04-27 NOTE — Progress Notes (Signed)
Symptom Management Consult note [facility]  [phone] [phone]  Patient Care Team: Tracie Harrier, MD as PCP - General (Internal Medicine) Noreene Filbert, MD as Referring Physician (Radiation Oncology) Telford Nab, RN as Registered Nurse [name], MD as Consulting Physician (Pulmonary Disease)   Name of the patient: [name] [name]  [id number]  Sep 24, 1940   Date of visit: 04/29/17  Diagnosis- Metastatic squamous cell carcinoma of the left lower lobe  Chief complaint/ Reason for visit- Right sided rib pain  Heme/Onc history: Patient was last seen by primary medical oncologist Dr. Mike Gip on 04/09/2017 prior to cycle 2 carbo/Abraxane and Keytruda. During that visit she admitted to feeling better since her last chemotherapy had been held. Chemotherapy was administered.  Patient was scheduled to have cycle 2 day 8 Abraxane only on 04/16/2017 but instead was seen in symptom management by me for "not feeling well". Patient complained of a productive cough with sputum production for approximately 1 week.  Associated symptoms included wheezing, shortness of breath with increased white sputum. Patient has history of pleural effusions with left thoracentesis in February 2019 with 250 ML's of yellow fluid removed. She additionally complained of right-sided rib pain that was worse with cough. Treatment was held and a stat chest x-ray was ordered. Chest x-ray revealed left base infiltrate and left-sided pleural effusion. Discussed with Dr. Mike Gip and it was recommended patient be admitted for inpatient observation. Patient refused admission. She was given 1 L normal saline for dehydration, duoneb for congestion and severe wheezing and pain medication for right sided rib pain in clinic. She was sent home with oral antibiotics, albuterol inhaler and a Medrol Dosepak. She was told to follow up next day in clinic for possible  fluids and  further breathing treatments.   Interval history-  Patient complains of Right Rib Pain for which has been present for 2 weeks.  Pain is located in Right front and back rib cage, is described as aching, sharp, shooting, stabbing and throbbing, and is excruciating .  Associated symptoms include: worsening and violent coughing attacks.   The patient has has had no relief from rest and a presribed narcotics.   Related to injury:   Yes (coughing)  ECOG FS:1 - Symptomatic but completely ambulatory  Review of systems- Review of Systems  Constitutional: Negative.  Negative for chills, fever, malaise/fatigue and weight loss.  HENT: Negative for congestion and ear pain.   Eyes: Negative.  Negative for blurred vision and double vision.  Respiratory: Positive for cough and sputum production. Negative for shortness of breath and wheezing.   Cardiovascular: Negative.  Negative for chest pain, palpitations and leg swelling.  Gastrointestinal: Negative.  Negative for abdominal pain, constipation, diarrhea, nausea and vomiting.  Genitourinary: Negative for dysuria, frequency and urgency.  Musculoskeletal: Positive for joint pain (Right sided rib pain). Negative for back pain and falls.  Skin: Negative.  Negative for rash.  Neurological: Negative.  Negative for weakness and headaches.  Endo/Heme/Allergies: Negative.  Does not bruise/bleed easily.  Psychiatric/Behavioral: Negative.  Negative for depression. The patient is not nervous/anxious and does not have insomnia.      Current treatment- Cycle 2, Day 1 carbo/Abraxane and Keytruda on 04/09/17  Allergies  Allergen Reactions  . Contrast Media [Iodinated Diagnostic Agents] Other (See Comments)    Pt was sent to the ED following contrast media injection at [place]. Unknown reason. She has been premedicated since without complications. Pt to be premedicated prior to contrast media  injections  . Ioxaglate Other (See Comments)    Pt was sent to  the ED following contrast media injection at [place]. Unknown reason. She has been premedicated since without complications. Pt to be premedicated prior to contrast media injections  . Sulfa Antibiotics Other (See Comments)    Unknown  . Tetracycline Hives  . White Petrolatum Other (See Comments)    Blisters  . Amoxicillin-Pot Clavulanate Rash and Other (See Comments)    Blisters in mouth Has patient had a PCN reaction causing immediate rash, facial/tongue/throat swelling, SOB or lightheadedness with hypotension: No Has patient had a PCN reaction causing severe rash involving mucus membranes or skin necrosis: No Has patient had a PCN reaction that required hospitalization: No Has patient had a PCN reaction occurring within the last 10 years: No If all of the above answers are "NO", then may proceed with Cephalosporin use.   . Tape Rash     Past Medical History:  Diagnosis Date  . Asthma   . Breast cancer ([place]) 2009   left  . Cancer ([place])   . CHF (congestive heart failure) ([place])   . CHF (congestive heart failure) ([place])   . Chronic UTI   . COPD (chronic obstructive pulmonary disease) ([place])   . Dizziness   . Fibromyalgia   . Hypertension   . Neuropathy   . Personal history of tobacco use, presenting hazards to health 05/17/2015  . Polyp, larynx   . RA (rheumatoid arthritis) ([place])   . Sinus infection    recent  . Stumbling gait    to the left  . Supplemental oxygen dependent    2.5l     Past Surgical History:  Procedure Laterality Date  . ABDOMINAL HYSTERECTOMY    . BREAST LUMPECTOMY Left 2009   chemo and radiation  . CYST EXCISION Left 02/27/2015   Procedure: CYST REMOVAL;  Surgeon: Hessie Knows, MD;  Location: [facility] ORS;  Service: Orthopedics;  Laterality: Left;  . EYE MUSCLE SURGERY Right    13 surgeries  . FLEXIBLE BRONCHOSCOPY N/A 07/01/2016   Procedure: FLEXIBLE BRONCHOSCOPY;  Surgeon: Wilhelmina Mcardle, MD;  Location: [facility] OR[facility];  Service: Pulmonary;   Laterality: N/A;  . FLEXIBLE BRONCHOSCOPY N/A 02/09/2017   Procedure: FLEXIBLE BRONCHOSCOPY;  Surgeon: [name], MD;  Location: [facility] OR[facility];  Service: Pulmonary;  Laterality: N/A;  . PORTA CATH INSERTION N/A 03/04/2017   Procedure: PORTA CATH INSERTION;  Surgeon: [name], MD;  Location: [place] [facility];  Service: Cardiovascular;  Laterality: N/A;  . THUMB ARTHROSCOPY Left     Social History   Socioeconomic History  . Marital status: Divorced    Spouse name: Not on file  . Number of children: Not on file  . Years of education: Not on file  . Highest education level: Not on file  Occupational History  . Not on file  Social Needs  . Financial resource strain: Not on file  . Food insecurity:    Worry: Not on file    Inability: Not on file  . Transportation needs:    Medical: Not on file    Non-medical: Not on file  Tobacco Use  . Smoking status: Current Every Day Smoker    Packs/day: 1.00    Years: 40.00    Pack years: 40.00    Types: Cigarettes  . Smokeless tobacco: Never Used  Substance and Sexual Activity  . Alcohol use: No  . Drug use: No  . Sexual activity: Not on file  Lifestyle  . Physical activity:    Days per week: Not on file    Minutes per session: Not on file  . Stress: Not on file  Relationships  . Social connections:    Talks on phone: Not on file    Gets together: Not on file    Attends religious service: Not on file    Active member of club or organization: Not on file    Attends meetings of clubs or organizations: Not on file    Relationship status: Not on file  . Intimate partner violence:    Fear of current or ex partner: Not on file    Emotionally abused: Not on file    Physically abused: Not on file    Forced sexual activity: Not on file  Other Topics Concern  . Not on file  Social History Narrative  . Not on file    Family History  Problem Relation Age of Onset  . Diabetes Father   . Stroke Father   . Heart attack  Father   . CAD Sister      Current Outpatient Medications:  .  albuterol (PROVENTIL HFA;VENTOLIN HFA) 108 (90 Base) MCG/ACT inhaler, Inhale 1-2 puffs into the lungs every 6 (six) hours as needed for wheezing or shortness of breath., Disp: 1 Inhaler, Rfl: 2 .  albuterol (PROVENTIL HFA;VENTOLIN HFA) 108 (90 Base) MCG/ACT inhaler, Inhale 2 puffs into the lungs every 6 (six) hours as needed for wheezing or shortness of breath., Disp: 1 Inhaler, Rfl: 2 .  Calcium-Vitamin D 600-200 MG-UNIT tablet, Take 1 tablet by mouth daily. , Disp: , Rfl:  .  carvedilol (COREG) 12.5 MG tablet, Take 1 tablet (12.5 mg total) by mouth 2 (two) times daily with a meal., Disp: 60 tablet, Rfl: 0 .  cholecalciferol (VITAMIN D) 1000 units tablet, Take 1,000 Units by mouth daily., Disp: , Rfl:  .  clonazePAM (KLONOPIN) 1 MG tablet, Take 1 mg by mouth 2 (two) times daily as needed for anxiety. , Disp: , Rfl:  .  dexamethasone (DECADRON) 4 MG tablet, Take 2 tablets by mouth once a day on the day after chemotherapy and then take 2 tablets two times a day for 2 days. Take with food., Disp: 30 tablet, Rfl: 1 .  gabapentin (NEURONTIN) 600 MG tablet, Take 600 mg by mouth 3 (three) times daily. , Disp: , Rfl:  .  HYDROcodone-acetaminophen (NORCO/VICODIN) 5-325 MG tablet, Take 1 tablet by mouth 2 (two) times daily. , Disp: , Rfl:  .  imipramine (TOFRANIL) 25 MG tablet, Take 25 mg by mouth at bedtime. , Disp: , Rfl:  .  levofloxacin (LEVAQUIN) 500 MG tablet, Take 1 tablet (500 mg total) by mouth daily., Disp: 10 tablet, Rfl: 0 .  lidocaine-prilocaine (EMLA) cream, Apply 1 application topically as needed., Disp: 30 g, Rfl: 3 .  LORazepam (ATIVAN) 0.5 MG tablet, Take 1 tablet (0.5 mg total) by mouth every 6 (six) hours as needed (Nausea or vomiting)., Disp: 30 tablet, Rfl: 0 .  Multiple Vitamin (MULTIVITAMIN) capsule, Take 1 capsule by mouth daily., Disp: , Rfl:  .  nitroGLYCERIN (NITROSTAT) 0.4 MG SL tablet, Place 0.4 mg under the  tongue every 5 (five) minutes as needed for chest pain. , Disp: , Rfl:  .  ondansetron (ZOFRAN) 8 MG tablet, Take 1 tablet (8 mg total) by mouth 2 (two) times daily as needed. Start on the third day after chemotherapy., Disp: 30 tablet, Rfl: 1 .  Tiotropium Bromide Monohydrate (  SPIRIVA RESPIMAT) 2.5 MCG/ACT AERS, Inhale 2.5 mcg into the lungs 2 (two) times daily., Disp: 1 Inhaler, Rfl: 0 .  traMADol (ULTRAM) 50 MG tablet, Take 100 mg by mouth daily. , Disp: , Rfl:  .  vitamin C (ASCORBIC ACID) 500 MG tablet, Take 500 mg by mouth daily., Disp: , Rfl:  .  ZINC SULFATE PO, Take 1 tablet by mouth daily. , Disp: , Rfl:  .  zolpidem (AMBIEN) 5 MG tablet, Take 5 mg by mouth at bedtime., Disp: , Rfl:  .  lidocaine (LIDODERM) 5 %, Place 1 patch onto the skin daily. Remove & Discard patch within 12 hours or as directed by MD, Disp: 30 patch, Rfl: 0  Physical exam:  Vitals:   04/27/17 1343  BP: 107/69  Pulse: (!) 108  Resp: (!) 22  Temp: 99.5 F (37.5 C)  TempSrc: Tympanic  SpO2: 99%  Weight: 114 lb 14.4 oz (52.1 kg)   Physical Exam  Constitutional: She is oriented to person, place, and time. Vital signs are normal.  HENT:  Head: Normocephalic and atraumatic.  Eyes: Pupils are equal, round, and reactive to light.  Neck: Normal range of motion.  Cardiovascular: Normal rate, regular rhythm and normal heart sounds.  No murmur heard. Pulmonary/Chest: Effort normal. She has decreased breath sounds. She has no wheezes.  Abdominal: Soft. Normal appearance and bowel sounds are normal. She exhibits no distension. There is no tenderness.  Musculoskeletal: Normal range of motion. She exhibits no edema.  Neurological: She is alert and oriented to person, place, and time.  Skin: Skin is warm and dry. No rash noted.  Psychiatric: Judgment normal.     CMP Latest Ref Rng & Units 04/23/2017  Glucose 65 - 99 mg/dL 101(H)  BUN 6 - 20 mg/dL 27(H)  Creatinine 0.44 - 1.00 mg/dL 1.07(H)  Sodium 135 - 145  mmol/L 130(L)  Potassium 3.5 - 5.1 mmol/L 4.3  Chloride 101 - 111 mmol/L 93(L)  CO2 22 - 32 mmol/L 27  Calcium 8.9 - 10.3 mg/dL 9.0  Total Protein 6.5 - 8.1 g/dL -  Total Bilirubin 0.3 - 1.2 mg/dL -  Alkaline Phos 38 - 126 U/L -  AST 15 - 41 U/L -  ALT 14 - 54 U/L -   CBC Latest Ref Rng & Units 04/23/2017  WBC 3.6 - 11.0 K/uL 5.2  Hemoglobin 12.0 - 16.0 g/dL 11.6(L)  Hematocrit 35.0 - 47.0 % 33.8(L)  Platelets 150 - 440 K/uL 219    No images are attached to the encounter.  Dg Chest 2 View  Result Date: 04/16/2017 CLINICAL DATA:  Cough.  Back and rib pain. EXAM: CHEST - 2 VIEW COMPARISON:  02/17/2017. FINDINGS: PowerPort catheter with tip over cavoatrial junction. Heart size stable. Normal pulmonary vascularity. Left base infiltrate and/or and left-sided pleural effusion. Small right pleural effusion. No pneumothorax. IMPRESSION: 1.  PowerPort catheter noted with tip over the right atrium. 2. Left base infiltrate and left-sided pleural effusion. Small right pleural effusion. Electronically Signed   By: Marcello Moores  Register   On: 04/16/2017 13:08     Assessment and plan- Patient is a 77 y.o. female who presents for right sided rib pain X 2 weeks. Thought to be from coughing attacks she has approximately 10 days ago..   1. Metastatic squamous cell carcinoma of the left lower lobe: Currently status post cycle 2 carbo/Abraxane and Keytruda. Abraxane held on 04/16/2017 d/t progressive shortness of breath and pneumonia diagnosis. Treated with fluids and DuoNeb in clinic  and Levaquin 500 mg for 10 days, steroid Dosepak and albuterol inhaler. She is scheduled for follow-up with Dr. Mike Gip on 04/30/2017 for cycle 3 day 1 of carbo/Abraxane Keytruda.  2. Right-sided rib pain: Had workup completed on 04/16/2017 with a chest x-ray showing no acute abnormalities to the rib. On initial evaluation patient was negative for obvious deformity to right rib. Patient states current narcotic regimen is not  covering her pain. Currently on 5-325 mg Norco by mouth twice a day and tramadol 50 mg tablets daily. Will add lidocaine 5% patch to right rib daily for pain. Patient stated this has worked in the past. Will reevaluate in the next couple days to see if this is helping. Cough appears to be improving with OTC cough medication.    Visit Diagnosis 1. Rib pain on right side   2. Primary cancer of left lower lobe of lung [facility])     Patient expressed understanding and was in agreement with this plan. She also understands that She can call clinic at any time with any questions, concerns, or complaints.   Greater than 50% was spent in counseling and coordination of care with this patient including but not limited to discussion of the relevant topics above (See A&P) including, but not limited to diagnosis and management of acute and chronic medical conditions.    Faythe Casa, AGNP-C [facility]- [phone] Pager- [phone] 04/29/2017 1:47 PM

## 2017-04-29 ENCOUNTER — Other Ambulatory Visit: Payer: Self-pay | Admitting: Urgent Care

## 2017-04-29 ENCOUNTER — Other Ambulatory Visit: Payer: Self-pay | Admitting: Hematology and Oncology

## 2017-04-29 DIAGNOSIS — E538 Deficiency of other specified B group vitamins: Secondary | ICD-10-CM | POA: Insufficient documentation

## 2017-04-29 LAB — METHYLMALONIC ACID, SERUM: Methylmalonic Acid, Quantitative: 789 nmol/L — ABNORMAL HIGH (ref 0–378)

## 2017-04-29 NOTE — Progress Notes (Signed)
[facility] day:  04/30/2017    Chief Complaint: [name] [name] is a 77 y.o. female with presumed metastatic squamous cell carcinoma of the left lower lobe who is seen for assessment prior to day 1 of cycle #3 carboplatin, Abraxane, and pembrolizumab.  HPI: The patient was last seen in the medical oncology clinic by me on 04/09/2017.  At that time,  she had a chronic cough. Shortness of breath was better. She was not wearing supplemental oxygen. She complained of difficulties urinating.  WBC was 5100 with and ANC of 3000. Hemoglobin was 10.4, hematocrit 30.4, and platelets 195,000.  She began cycle #2.  She saw seen by Faythe Casa, NP on 04/16/2017.  She complained of a productive cough and shortness of breath.  CXR revealed left base infiltrate and left sided pleural effusion.  She declined admission.  She was treated with Levaquin, albuterol inhaler, and a steroid Dosepak.  Chemotherapy was held.  Anemia work-up on 04/16/2017 revealed a ferritin of 163, 9% saturation, TIBC 260, folate 25.0, and TSH 2.307.  B12 was 254 (borderline low).  Methylmalonic acid was 789 on 04/23/2017 and c/w B12 deficiency.  She was seen by Faythe Casa, NP on 04/27/2017.  She complained of right rib pain.  A lidocaine 5% patch was added to her pain medication regimen (Norco 5-325 mg BID and Tramadol 50 mg q day).  During the interim, patient has severe pain in her RIGHT ribs. Pain is exacerbated by coughing and deep inspiration. Her cough is productive; sputum clear. Patient denies fevers. Patient has chronic shortness of breath related to her COPD. She notes that she is "sore all over". Pain is rated 10/10 in the clinic today.   Patient has a decreased appetite. Her weight is down another 2 pounds.    Past Medical History:  Diagnosis Date  . Asthma   . Breast cancer ([place]) 2009   left  . Cancer ([place])   . CHF (congestive heart failure) ([place])   . CHF (congestive  heart failure) ([place])   . Chronic UTI   . COPD (chronic obstructive pulmonary disease) ([place])   . Dizziness   . Fibromyalgia   . Hypertension   . Neuropathy   . Personal history of tobacco use, presenting hazards to health 05/17/2015  . Polyp, larynx   . RA (rheumatoid arthritis) ([place])   . Sinus infection    recent  . Stumbling gait    to the left  . Supplemental oxygen dependent    2.5l    Past Surgical History:  Procedure Laterality Date  . ABDOMINAL HYSTERECTOMY    . BREAST LUMPECTOMY Left 2009   chemo and radiation  . CYST EXCISION Left 02/27/2015   Procedure: CYST REMOVAL;  Surgeon: Hessie Knows, MD;  Location: [facility] ORS;  Service: Orthopedics;  Laterality: Left;  . EYE MUSCLE SURGERY Right    13 surgeries  . FLEXIBLE BRONCHOSCOPY N/A 07/01/2016   Procedure: FLEXIBLE BRONCHOSCOPY;  Surgeon: Wilhelmina Mcardle, MD;  Location: [facility] ORS;  Service: Pulmonary;  Laterality: N/A;  . FLEXIBLE BRONCHOSCOPY N/A 02/09/2017   Procedure: FLEXIBLE BRONCHOSCOPY;  Surgeon: [name], MD;  Location: [facility] ORS;  Service: Pulmonary;  Laterality: N/A;  . PORTA CATH INSERTION N/A 03/04/2017   Procedure: PORTA CATH INSERTION;  Surgeon: [name], MD;  Location: [facility];  Service: Cardiovascular;  Laterality: N/A;  . THUMB ARTHROSCOPY Left     Family History  Problem Relation Age  of Onset  . Diabetes Father   . Stroke Father   . Heart attack Father   . CAD Sister     Social History:  reports that she has been smoking cigarettes.  She has a 40.00 pack-year smoking history. She has never used smokeless tobacco. She reports that she does not drink alcohol or use drugs.  He lives with her son and grandson. The patient is alone today.   Allergies:  Allergies  Allergen Reactions  . Contrast Media [Iodinated Diagnostic Agents] Other (See Comments)    Pt was sent to the ED following contrast media injection at [place]. Unknown reason. She has been premedicated since  without complications. Pt to be premedicated prior to contrast media injections  . Ioxaglate Other (See Comments)    Pt was sent to the ED following contrast media injection at [place]. Unknown reason. She has been premedicated since without complications. Pt to be premedicated prior to contrast media injections  . Sulfa Antibiotics Other (See Comments)    Unknown  . Tetracycline Hives  . White Petrolatum Other (See Comments)    Blisters  . Amoxicillin-Pot Clavulanate Rash and Other (See Comments)    Blisters in mouth Has patient had a PCN reaction causing immediate rash, facial/tongue/throat swelling, SOB or lightheadedness with hypotension: No Has patient had a PCN reaction causing severe rash involving mucus membranes or skin necrosis: No Has patient had a PCN reaction that required hospitalization: No Has patient had a PCN reaction occurring within the last 10 years: No If all of the above answers are "NO", then may proceed with Cephalosporin use.   . Tape Rash    Current Medications: Current Outpatient Medications  Medication Sig Dispense Refill  . albuterol (PROVENTIL HFA;VENTOLIN HFA) 108 (90 Base) MCG/ACT inhaler Inhale 1-2 puffs into the lungs every 6 (six) hours as needed for wheezing or shortness of breath. 1 Inhaler 2  . Calcium-Vitamin D 600-200 MG-UNIT tablet Take 1 tablet by mouth daily.     . carvedilol (COREG) 12.5 MG tablet Take 1 tablet (12.5 mg total) by mouth 2 (two) times daily with a meal. 60 tablet 0  . cholecalciferol (VITAMIN D) 1000 units tablet Take 1,000 Units by mouth daily.    . clonazePAM (KLONOPIN) 1 MG tablet Take 1 mg by mouth 2 (two) times daily as needed for anxiety.     . gabapentin (NEURONTIN) 600 MG tablet Take 600 mg by mouth 3 (three) times daily.     [name] HYDROcodone-acetaminophen (NORCO/VICODIN) 5-325 MG tablet Take 1 tablet by mouth 2 (two) times daily.     [name] imipramine (TOFRANIL) 25 MG tablet Take 25 mg by mouth at bedtime.     .  lidocaine-prilocaine (EMLA) cream Apply 1 application topically as needed. 30 g 3  . LORazepam (ATIVAN) 0.5 MG tablet Take 1 tablet (0.5 mg total) by mouth every 6 (six) hours as needed (Nausea or vomiting). 30 tablet 0  . Multiple Vitamin (MULTIVITAMIN) capsule Take 1 capsule by mouth daily.    . ondansetron (ZOFRAN) 8 MG tablet Take 1 tablet (8 mg total) by mouth 2 (two) times daily as needed. Start on the third day after chemotherapy. 30 tablet 1  . Tiotropium Bromide Monohydrate (SPIRIVA RESPIMAT) 2.5 MCG/ACT AERS Inhale 2.5 mcg into the lungs 2 (two) times daily. 1 Inhaler 0  . traMADol (ULTRAM) 50 MG tablet Take 100 mg by mouth daily.     . vitamin C (ASCORBIC ACID) 500 MG tablet Take 500  mg by mouth daily.    [name] ZINC SULFATE PO Take 1 tablet by mouth daily.     [name] zolpidem (AMBIEN) 5 MG tablet Take 5 mg by mouth at bedtime.    [name] dexamethasone (DECADRON) 4 MG tablet Take 2 tablets by mouth once a day on the day after chemotherapy and then take 2 tablets two times a day for 2 days. Take with food. (Patient not taking: Reported on 04/30/2017) 30 tablet 1  . lidocaine (LIDODERM) 5 % Place 1 patch onto the skin daily. Remove & Discard patch within 12 hours or as directed by MD (Patient not taking: Reported on 04/30/2017) 30 patch 0  . nitroGLYCERIN (NITROSTAT) 0.4 MG SL tablet Place 0.4 mg under the tongue every 5 (five) minutes as needed for chest pain.      No current facility-administered medications for this visit.     Review of Systems:  GENERAL:  General fatigue.  Feels "sore all over".  No fevers, sweats.  Weight down 2 pounds. PERFORMANCE STATUS (ECOG):  2 HEENT:  No visual changes, runny nose, sore throat, mouth sores or tenderness. Lungs: Chronic shortness of breath due to COPD (stable).  Cough productive of clear sputum.  No hemoptysis.  On oxygen 3 liters via [place]. Cardiac:  No chest pain, palpitations, orthopnea, or PND. GI:  Nausea, improved.  No vomiting, diarrhea, constipation,  melena or hematochezia. GU:  No urgency, frequency, dysuria, or hematuria. Musculoskeletal:  Severe right sided chest pain.  No muscle tenderness. Extremities:  No pain or swelling. Skin:  Hair coming out.  No rashes or skin changes. Neuro:  Neuropathy in 3rd-5th digits of right hand.  No headache, numbness or weakness, balance or coordination issues. Endocrine:  No diabetes, thyroid issues, hot flashes or night sweats. Psych:  No mood changes, depression or anxiety. Ambien for sleep. Pain:  All over and right ribs (10 out of 10). Review of systems:  All other systems reviewed and found to be negative.   Physical Exam: Blood pressure (!) 141/78, pulse 98, temperature (!) 96.5 F (35.8 C), temperature source Tympanic, resp. rate 20, weight 112 lb 12.8 oz (51.2 kg).  GENERAL:  Thin woman sitting comfortably in the exam room in no acute distress.  She has a walker by her side. MENTAL STATUS:  Alert and oriented to person, place and time. HEAD:  Wearing a pink cap.  Normocephalic, atraumatic, face symmetric, no Cushingoid features. EYES:  Blue eyes.  Pupils equal round and reactive to light and accomodation.  No conjunctivitis or scleral icterus. ENT:  Oropharynx clear without lesion.  Tongue normal. Mucous membranes moist.  RESPIRATORY:  Decreased breath sounds left base (no change).  Clear to auscultation without rales, wheezes or rhonchi. CARDIOVASCULAR:  Regular rate and rhythm without murmur, rub or gallop. CHEST WALL:  Tender right sided posterior chest wall/ribs.  Pain also "inside". ABDOMEN:  Soft, non-tender, with active bowel sounds, and no hepatosplenomegaly.  No masses. SKIN:  No rashes, ulcers or lesions. EXTREMITIES: Arthritic changes in hands.  No edema, no skin discoloration or tenderness.  No palpable cords. LYMPH NODES: No palpable cervical, supraclavicular, axillary or inguinal adenopathy  NEUROLOGICAL: Unremarkable. PSYCH:  Appropriate.    Infusion on 04/30/2017   Component Date Value Ref Range Status  . Sodium 04/30/2017 130* 135 - 145 mmol/L Final  . Potassium 04/30/2017 4.0  3.5 - 5.1 mmol/L Final  . Chloride 04/30/2017 97* 101 - 111 mmol/L Final  . CO2 04/30/2017 25  22 -  32 mmol/L Final  . Glucose, Bld 04/30/2017 102* 65 - 99 mg/dL Final  . BUN 04/30/2017 18  6 - 20 mg/dL Final  . Creatinine, Ser 04/30/2017 1.03* 0.44 - 1.00 mg/dL Final  . Calcium 04/30/2017 8.9  8.9 - 10.3 mg/dL Final  . Total Protein 04/30/2017 7.0  6.5 - 8.1 g/dL Final  . Albumin 04/30/2017 3.3* 3.5 - 5.0 g/dL Final  . AST 04/30/2017 15  15 - 41 U/L Final  . ALT 04/30/2017 10* 14 - 54 U/L Final  . Alkaline Phosphatase 04/30/2017 117  38 - 126 U/L Final  . Total Bilirubin 04/30/2017 0.5  0.3 - 1.2 mg/dL Final  . GFR calc non Af Amer 04/30/2017 52* >60 mL/min Final  . GFR calc Af Amer 04/30/2017 >60  >60 mL/min Final   Comment: (NOTE) The eGFR has been calculated using the CKD EPI equation. This calculation has not been validated in all clinical situations. eGFR's persistently <60 mL/min signify possible Chronic Kidney Disease.   Georgiann Hahn gap 04/30/2017 8  5 - 15 Final   Performed at [facility], [address] [place], [place] [address]  . WBC 04/30/2017 7.8  3.6 - 11.0 K/uL Final  . RBC 04/30/2017 3.47* 3.80 - 5.20 MIL/uL Final  . Hemoglobin 04/30/2017 11.0* 12.0 - 16.0 g/dL Final  . HCT 04/30/2017 31.7* 35.0 - 47.0 % Final  . MCV 04/30/2017 91.5  80.0 - 100.0 fL Final  . MCH 04/30/2017 31.7  26.0 - 34.0 pg Final  . MCHC 04/30/2017 34.7  32.0 - 36.0 g/dL Final  . RDW 04/30/2017 16.5* 11.5 - 14.5 % Final  . Platelets 04/30/2017 169  150 - 440 K/uL Final  . Neutrophils Relative % 04/30/2017 74  % Final  . Neutro Abs 04/30/2017 5.7  1.4 - 6.5 K/uL Final  . Lymphocytes Relative 04/30/2017 12  % Final  . Lymphs Abs 04/30/2017 0.9* 1.0 - 3.6 K/uL Final  . Monocytes Relative 04/30/2017 12  % Final  . Monocytes Absolute 04/30/2017 0.9  0.2 - 0.9 K/uL Final  .  Eosinophils Relative 04/30/2017 1  % Final  . Eosinophils Absolute 04/30/2017 0.1  0 - 0.7 K/uL Final  . Basophils Relative 04/30/2017 1  % Final  . Basophils Absolute 04/30/2017 0.1  0 - 0.1 K/uL Final   Performed at [facility], [address], [place], [place] [address]  . Magnesium 04/30/2017 1.9  1.7 - 2.4 mg/dL Final   Performed at [facility], [address] [place], [place] [address]    Assessment:  GRAINNE KNIGHTS is a 77 y.o. female with presumed metastatic squamous cell lung cancer.  She presented with clinical [id number] squamous cell lung cancer of the left lung s/p bronchoscopy and biopsy on 07/01/2016.  She has a 40 pack year smoking history.  She presented with left lower chest wall pain.  Foundation One testing on 03/13/2017 revealed no reportable alterations in EGFR, RET, ALK, MET, ERBB2, BRAF, and ROS1.    Tumor was MS-stable.  TMB was 3 Muts/Mb.  There was CDKN2A loss, CDKN2B loss, KRAS G12V, MTAP loss, and TP53 R282W.  PD-L1 was 5%.  Chest CT with contrast on 06/11/2016 revealed a 3.2 x 3.0 mass like area of focal opacity in the medial left lower lobe with obliteration of segmental airways to the anterior left lower lobe.  PET scan on 06/27/2016 revealed a 4.3 x 3.2 cm central left lower lobe lung lesion (SUV 14.3) consistent with primary  bronchogenic carcinoma.  There was equivocal nodal tissue in the subcarinal station demonstrating mild hypermetabolism (SUV 3.6). There was more peripheral left lower lobe increased atelectasis with mucoid impaction which is likely secondary to endobronchial obstruction.  There was a small left pleural effusion.  Head MRI on 07/07/2016 revealed no evidence of metastatic disease.  She received radiation from 07/22/2016 - 09/19/2016.  She received 5 weeks of concurrent carboplatin and Taxol (07/24/2016 - 08/11/2016; 09/05/2016 - 09/16/2016). She requires a reduced dose of Benadryl (25 mg) for her premedication.  Week #3 was  postponed secondary to chest pain and evaluation.  She missed a couple of weeks.  Chest CT on 11/21/2016 revealed progressive collapse of the LEFT lower lobe presumably related to central obstructing mass.  Mass obstructed the LEFT lower lobe bronchus, although mass was not well demonstrated.  There were small effusions (no change).  There was centrilobular emphysema unchanged.  There was no evidence pneumonia.  PET scan on 01/27/2017 revealed interval response to therapy.  The previously noted hypermetabolism associated with left lower lobe perihilar lung mass has resolved in the interval.  There was a moderate left pleural effusion that had increased in volume from previous PET-CT and there was now complete atelectasis/consolidation of the left lower lobe, which may obscure residual mass.  There was no new sites of hypermetabolism or evidence of distant metastatic disease.  Bronchoscopy on 02/09/2017 revealed no evidence of endobronchial tumor.  There was stenosis at the entry to the left lower posterior segments which could not be entered.  There was mildly erythematous mucosa with copious secretions in the left lower lobe.  Brushings were positive for non-small cell carcinoma, favor squamous cell carcinoma.  Ultrasound guided thoracentesis on 02/17/2017 revealed was negative for malignancy.  CEA was 7.9 on 04/09/2017 and 7.5 on 04/30/2017.  Bone scan on 02/27/2017 revealed a subtle focus of uptake in the left 7th rib, indeterminate.  She is s/p 2 cycles of carboplatin, Abraxane and pembrolizumab (03/19/2017 - 04/09/2017).  Cycle #2 was truncated secondary to pneumonia.  She has a normocytic anemia likely due to chemotherapy.  Work-up on 07/24/2016 revealed the following normal labs: ferritin (103), B12 (345), folate(19.7).  Iron saturation was 8% and TIBC was 258.  She has a history of stage IA left breast cancer in 02/2005.  She underwent lumpectomy.  Pathology revealed a T1cN0 lesion.  Tumor was  ER + , PR +, and Her2/neu -.  Two sentinel lymph nodes were negative.  She received chemotherapy (4 cycles of AC and possibly an abbreviated course of Taxol- no records available).  She received radiation.  She completed Femara in 08/2010.  She has B12 deficiency. B12 was 254 (borderline low) on 04/16/2017.  Methylmalonic acid was 789 on 04/23/2017 and c/w B12 deficiency.  She has fibromyalgia.  She has advanced thoracoabdominal aortic atherosclerosis with fusiform dilatation of the descending thoracic aorta up to 4.3 cm.  She is followed by Dr. Lucky Cowboy.  She has a history of recurrent UTIs.  UA on 03/19/2017 revealed  >100,000 CFU/mL of an ESBL producing E. coli.  She was treated with fosfomycin 3 g x 1 dose.  Symptomatically, she has significant pain in her RIGHT ribs.  Pain also appears pleuritic.  Shortness of breath is stable.  Exam is stable.  Sodium is 130. BUN 18, creatinine 1.03 (CrCl 37.9 mL/min). WBC is 7800 with and ANC of 5700. Hemoglobin is 11.0, hematocrit 31.7, and platelets 169,000.  Plan: 1.  Labs today:  CBC with  diff, CMP, Mg, CEA. 2.  Will hold day 1 of cycle #3 carboplatin + Abraxane + pembrolizumab. 3.  Discuss B12 deficiency. MMA elevated. Will start on weekly B12 injections x 6, then monthly. First injection today.  4.  STAT CT angio to rule out PE. Questionable contrast allergy. Called and discussed with Dr. Rosario Jacks (radiologist), who approved [place] to proceed.  Will pre-medicate with 4 hour protocol (SoluMedrol 43m 4 hours prior to scan, followed by Benadryl 50 mg 1 hour piror to scan). Patient in agreement and wishes to proceed.  5.  RTC in 1 week for labs (CBC with diff, CMP), and day 1 cycle #3 carboplatin + Abraxane + pembrolizumab.  ADDENDUM:  Images personally reviewed.  Chest CT angio of completed. No evidence of acute pulmonary embolism. Mention made of new wedge compression fracture at T7 (not present on PET done on 01/27/2017). Referred patient to orthopedics for  further evaluation. Patient is scheduled to see Dr. MRudene Christianson 05/07/2017.   BHonor Loh NP  04/30/2017, 9:03 AM  I saw and evaluated the patient, participating in the key portions of the service and reviewing pertinent diagnostic studies and records.  I reviewed the nurse practitioner's note and agree with the findings and the plan.  The assessment and plan were discussed with the patient.  Over 25 minutes were spent with the patient as well as coordinating care of which > 50% was face-to-face.  Several questions were asked by the patient and answered.   MNolon Stalls MD 04/30/2017,9:03 AM

## 2017-04-30 ENCOUNTER — Inpatient Hospital Stay: Payer: Medicare HMO

## 2017-04-30 ENCOUNTER — Other Ambulatory Visit: Payer: Self-pay

## 2017-04-30 ENCOUNTER — Encounter: Payer: Self-pay | Admitting: Hematology and Oncology

## 2017-04-30 ENCOUNTER — Inpatient Hospital Stay (HOSPITAL_BASED_OUTPATIENT_CLINIC_OR_DEPARTMENT_OTHER): Payer: Medicare HMO | Admitting: Hematology and Oncology

## 2017-04-30 ENCOUNTER — Ambulatory Visit
Admission: RE | Admit: 2017-04-30 | Discharge: 2017-04-30 | Disposition: A | Discharge status: Home / Self Care | Payer: Medicare HMO | Source: Ambulatory Visit | Attending: Urgent Care | Admitting: Urgent Care

## 2017-04-30 ENCOUNTER — Telehealth: Payer: Self-pay | Admitting: *Deleted

## 2017-04-30 VITALS — BP 141/78 | HR 98 | Temp 96.5°F | Resp 20 | Wt 112.8 lb

## 2017-04-30 DIAGNOSIS — Z853 Personal history of malignant neoplasm of breast: Secondary | ICD-10-CM

## 2017-04-30 DIAGNOSIS — C3432 Malignant neoplasm of lower lobe, left bronchus or lung: Secondary | ICD-10-CM | POA: Diagnosis not present

## 2017-04-30 DIAGNOSIS — J432 Centrilobular emphysema: Secondary | ICD-10-CM | POA: Diagnosis not present

## 2017-04-30 DIAGNOSIS — E649 Sequelae of unspecified nutritional deficiency: Secondary | ICD-10-CM

## 2017-04-30 DIAGNOSIS — E538 Deficiency of other specified B group vitamins: Secondary | ICD-10-CM

## 2017-04-30 DIAGNOSIS — R5383 Other fatigue: Secondary | ICD-10-CM | POA: Diagnosis not present

## 2017-04-30 DIAGNOSIS — J9811 Atelectasis: Secondary | ICD-10-CM | POA: Insufficient documentation

## 2017-04-30 DIAGNOSIS — J440 Chronic obstructive pulmonary disease with acute lower respiratory infection: Secondary | ICD-10-CM

## 2017-04-30 DIAGNOSIS — I700 Atherosclerosis of aorta: Secondary | ICD-10-CM

## 2017-04-30 DIAGNOSIS — J90 Pleural effusion, not elsewhere classified: Secondary | ICD-10-CM | POA: Insufficient documentation

## 2017-04-30 DIAGNOSIS — R0781 Pleurodynia: Secondary | ICD-10-CM | POA: Diagnosis not present

## 2017-04-30 DIAGNOSIS — Z9981 Dependence on supplemental oxygen: Secondary | ICD-10-CM | POA: Diagnosis not present

## 2017-04-30 DIAGNOSIS — I110 Hypertensive heart disease with heart failure: Secondary | ICD-10-CM | POA: Diagnosis not present

## 2017-04-30 DIAGNOSIS — R5381 Other malaise: Secondary | ICD-10-CM | POA: Diagnosis not present

## 2017-04-30 DIAGNOSIS — M069 Rheumatoid arthritis, unspecified: Secondary | ICD-10-CM

## 2017-04-30 DIAGNOSIS — R52 Pain, unspecified: Secondary | ICD-10-CM | POA: Diagnosis not present

## 2017-04-30 DIAGNOSIS — Z7952 Long term (current) use of systemic steroids: Secondary | ICD-10-CM

## 2017-04-30 DIAGNOSIS — I509 Heart failure, unspecified: Secondary | ICD-10-CM

## 2017-04-30 DIAGNOSIS — Z9223 Personal history of estrogen therapy: Secondary | ICD-10-CM

## 2017-04-30 DIAGNOSIS — R531 Weakness: Secondary | ICD-10-CM | POA: Diagnosis not present

## 2017-04-30 DIAGNOSIS — M797 Fibromyalgia: Secondary | ICD-10-CM

## 2017-04-30 DIAGNOSIS — I712 Thoracic aortic aneurysm, without rupture: Secondary | ICD-10-CM | POA: Insufficient documentation

## 2017-04-30 DIAGNOSIS — Z7189 Other specified counseling: Secondary | ICD-10-CM | POA: Diagnosis not present

## 2017-04-30 DIAGNOSIS — C3490 Malignant neoplasm of unspecified part of unspecified bronchus or lung: Secondary | ICD-10-CM | POA: Diagnosis not present

## 2017-04-30 DIAGNOSIS — M4854XA Collapsed vertebra, not elsewhere classified, thoracic region, initial encounter for fracture: Secondary | ICD-10-CM | POA: Diagnosis not present

## 2017-04-30 DIAGNOSIS — F17210 Nicotine dependence, cigarettes, uncomplicated: Secondary | ICD-10-CM

## 2017-04-30 DIAGNOSIS — Z170 Estrogen receptor positive status [ER+]: Secondary | ICD-10-CM

## 2017-04-30 DIAGNOSIS — S22060A Wedge compression fracture of T7-T8 vertebra, initial encounter for closed fracture: Secondary | ICD-10-CM

## 2017-04-30 DIAGNOSIS — Z923 Personal history of irradiation: Secondary | ICD-10-CM

## 2017-04-30 DIAGNOSIS — M899 Disorder of bone, unspecified: Secondary | ICD-10-CM

## 2017-04-30 DIAGNOSIS — Z79899 Other long term (current) drug therapy: Secondary | ICD-10-CM

## 2017-04-30 LAB — CBC WITH DIFFERENTIAL/PLATELET
Basophils Absolute: 0.1 10*3/uL (ref 0–0.1)
Basophils Relative: 1 %
Eosinophils Absolute: 0.1 10*3/uL (ref 0–0.7)
Eosinophils Relative: 1 %
HCT: 31.7 % — ABNORMAL LOW (ref 35.0–47.0)
Hemoglobin: 11 g/dL — ABNORMAL LOW (ref 12.0–16.0)
Lymphocytes Relative: 12 %
Lymphs Abs: 0.9 10*3/uL — ABNORMAL LOW (ref 1.0–3.6)
MCH: 31.7 pg (ref 26.0–34.0)
MCHC: 34.7 g/dL (ref 32.0–36.0)
MCV: 91.5 fL (ref 80.0–100.0)
Monocytes Absolute: 0.9 10*3/uL (ref 0.2–0.9)
Monocytes Relative: 12 %
Neutro Abs: 5.7 10*3/uL (ref 1.4–6.5)
Neutrophils Relative %: 74 %
Platelets: 169 10*3/uL (ref 150–440)
RBC: 3.47 MIL/uL — ABNORMAL LOW (ref 3.80–5.20)
RDW: 16.5 % — ABNORMAL HIGH (ref 11.5–14.5)
WBC: 7.8 10*3/uL (ref 3.6–11.0)

## 2017-04-30 LAB — COMPREHENSIVE METABOLIC PANEL
ALT: 10 U/L — ABNORMAL LOW (ref 14–54)
AST: 15 U/L (ref 15–41)
Albumin: 3.3 g/dL — ABNORMAL LOW (ref 3.5–5.0)
Alkaline Phosphatase: 117 U/L (ref 38–126)
Anion gap: 8 (ref 5–15)
BUN: 18 mg/dL (ref 6–20)
CO2: 25 mmol/L (ref 22–32)
Calcium: 8.9 mg/dL (ref 8.9–10.3)
Chloride: 97 mmol/L — ABNORMAL LOW (ref 101–111)
Creatinine, Ser: 1.03 mg/dL — ABNORMAL HIGH (ref 0.44–1.00)
GFR calc Af Amer: >60 mL/min (ref >60)
GFR calc non Af Amer: 52 mL/min — ABNORMAL LOW (ref >60)
Glucose, Bld: 102 mg/dL — ABNORMAL HIGH (ref 65–99)
Potassium: 4 mmol/L (ref 3.5–5.1)
Sodium: 130 mmol/L — ABNORMAL LOW (ref 135–145)
Total Bilirubin: 0.5 mg/dL (ref 0.3–1.2)
Total Protein: 7 g/dL (ref 6.5–8.1)

## 2017-04-30 LAB — MAGNESIUM: Magnesium: 1.9 mg/dL (ref 1.7–2.4)

## 2017-04-30 MED ORDER — IOHEXOL 350 MG/ML SOLN
75.0000 mL | Freq: Once | INTRAVENOUS | Status: AC | PRN
  Administered 2017-04-30: 75 mL via INTRAVENOUS

## 2017-04-30 MED ORDER — METHYLPREDNISOLONE SODIUM SUCC 125 MG IJ SOLR
40.0000 mg | Freq: Once | INTRAMUSCULAR | Status: AC
  Administered 2017-04-30: 40 mg via INTRAVENOUS
  Filled 2017-04-30: qty 2
  Filled 2017-04-30: qty 1

## 2017-04-30 MED ORDER — METHYLPREDNISOLONE SODIUM SUCC 125 MG IJ SOLR: 40.0000 mg | Freq: Once | INTRAMUSCULAR | Status: DC

## 2017-04-30 MED ORDER — SODIUM CHLORIDE 0.9% FLUSH
10.0000 mL | INTRAVENOUS | Status: DC | PRN
  Administered 2017-04-30: 10 mL via INTRAVENOUS
  Filled 2017-04-30: qty 10

## 2017-04-30 MED ORDER — DIPHENHYDRAMINE HCL 50 MG/ML IJ SOLN: 50.0000 mg | Freq: Once | INTRAMUSCULAR | Status: DC

## 2017-04-30 MED ORDER — SODIUM CHLORIDE 0.9 % IV SOLN
Freq: Once | INTRAVENOUS | Status: AC
  Administered 2017-04-30: 10:00:00 via INTRAVENOUS
  Filled 2017-04-30: qty 1000

## 2017-04-30 MED ORDER — CYANOCOBALAMIN 1000 MCG/ML IJ SOLN
1000.0000 ug | Freq: Once | INTRAMUSCULAR | Status: AC
  Administered 2017-04-30: 1000 ug via INTRAMUSCULAR
  Filled 2017-04-30: qty 1

## 2017-04-30 MED ORDER — HEPARIN SOD (PORK) LOCK FLUSH 100 UNIT/ML IV SOLN
500.0000 [IU] | Freq: Once | INTRAVENOUS | Status: AC
  Administered 2017-04-30: 500 [IU] via INTRAVENOUS
  Filled 2017-04-30: qty 5

## 2017-04-30 MED ORDER — DIPHENHYDRAMINE HCL 50 MG/ML IJ SOLN
50.0000 mg | Freq: Once | INTRAMUSCULAR | Status: AC
  Administered 2017-04-30: 50 mg via INTRAVENOUS
  Filled 2017-04-30: qty 1

## 2017-04-30 NOTE — Progress Notes (Signed)
Here for follow up. Stated in pain this am  Pain meds are not holding her pain

## 2017-04-30 NOTE — Telephone Encounter (Signed)
Called report   IMPRESSION: 1. No evidence acute pulmonary embolism. 2. New wedge compression fracture at T7 compared to PET-CT 01/27/2017. No metastatic lesion seen at that level comparison CT. 3. Chronic atelectasis of the LEFT lower lobe effusion. 4. Stable aneurysmal dilatation of the descending thoracic aorta. 5. Severe centrilobular emphysema.  These results will be called to the ordering clinician or representative by the Radiologist Assistant, and communication documented in the PACS or zVision Dashboard.   Electronically Signed   By: Suzy Bouchard M.D.   On: 04/30/2017 14:57

## 2017-05-01 LAB — CEA: CEA: 7.5 ng/mL — ABNORMAL HIGH (ref 0.0–4.7)

## 2017-05-04 ENCOUNTER — Encounter: Payer: Self-pay | Admitting: Urology

## 2017-05-04 ENCOUNTER — Ambulatory Visit: Payer: Self-pay | Admitting: Urology

## 2017-05-06 ENCOUNTER — Telehealth: Payer: Self-pay | Admitting: *Deleted

## 2017-05-06 NOTE — Telephone Encounter (Signed)
Dr. Mike Gip and I just discussed this. We have no idea what she is talking about. We never discussed OxyContin with her. We are both a little confused. She is on pain medications, but still having pain. The source of her pain has been unclear. We suggested that she speak with her PCP. Fast forward, a CT angio was done to rule out a PE. Incidental finding was made of a NEW wedge fracture in her thoracic spine. She was referred, and has been scheduled to see, orthopedics.

## 2017-05-06 NOTE — Telephone Encounter (Signed)
Patient called to ask if someone from Dr. Kem Parkinson team had called Dr. Linton Ham office yet about Oxycontin RX. She stated that Dr. Mike Gip told her that Dr. Ginette Pitman would have to write the Oxycontin RX, but that she knew he wouldn't take her word for it, so Dr. Mike Gip would have to contact him to let know. She stated that she has been waiting one week already.

## 2017-05-06 NOTE — Telephone Encounter (Signed)
Returned call to patient and explained that Dr. Mike Gip did not discuss giving her Oxycontin. Verified with patient that she has an appointment with ortho on Friday and instructed her to call and see if she can get that appointment possibly moved up to tomorrow, since she is in so much pain. Patient verbalized understanding.      dhs

## 2017-05-07 ENCOUNTER — Other Ambulatory Visit: Payer: Self-pay | Admitting: Orthopedic Surgery

## 2017-05-07 ENCOUNTER — Ambulatory Visit: Payer: Medicare HMO

## 2017-05-07 DIAGNOSIS — S22060A Wedge compression fracture of T7-T8 vertebra, initial encounter for closed fracture: Secondary | ICD-10-CM | POA: Diagnosis not present

## 2017-05-08 ENCOUNTER — Other Ambulatory Visit: Payer: Self-pay

## 2017-05-08 ENCOUNTER — Inpatient Hospital Stay (HOSPITAL_BASED_OUTPATIENT_CLINIC_OR_DEPARTMENT_OTHER): Payer: Medicare HMO | Admitting: Hematology and Oncology

## 2017-05-08 ENCOUNTER — Telehealth: Payer: Self-pay | Admitting: *Deleted

## 2017-05-08 ENCOUNTER — Inpatient Hospital Stay: Payer: Medicare HMO

## 2017-05-08 ENCOUNTER — Encounter: Payer: Self-pay | Admitting: Hematology and Oncology

## 2017-05-08 VITALS — BP 123/73 | HR 85 | Temp 97.1°F

## 2017-05-08 DIAGNOSIS — R0789 Other chest pain: Secondary | ICD-10-CM | POA: Insufficient documentation

## 2017-05-08 DIAGNOSIS — Z170 Estrogen receptor positive status [ER+]: Secondary | ICD-10-CM

## 2017-05-08 DIAGNOSIS — R0781 Pleurodynia: Secondary | ICD-10-CM | POA: Diagnosis not present

## 2017-05-08 DIAGNOSIS — Z9981 Dependence on supplemental oxygen: Secondary | ICD-10-CM | POA: Diagnosis not present

## 2017-05-08 DIAGNOSIS — S22060A Wedge compression fracture of T7-T8 vertebra, initial encounter for closed fracture: Secondary | ICD-10-CM | POA: Diagnosis not present

## 2017-05-08 DIAGNOSIS — R300 Dysuria: Secondary | ICD-10-CM | POA: Diagnosis not present

## 2017-05-08 DIAGNOSIS — D649 Anemia, unspecified: Secondary | ICD-10-CM | POA: Diagnosis not present

## 2017-05-08 DIAGNOSIS — Z923 Personal history of irradiation: Secondary | ICD-10-CM

## 2017-05-08 DIAGNOSIS — Z87440 Personal history of urinary (tract) infections: Secondary | ICD-10-CM

## 2017-05-08 DIAGNOSIS — Z5111 Encounter for antineoplastic chemotherapy: Secondary | ICD-10-CM

## 2017-05-08 DIAGNOSIS — F17210 Nicotine dependence, cigarettes, uncomplicated: Secondary | ICD-10-CM

## 2017-05-08 DIAGNOSIS — C3432 Malignant neoplasm of lower lobe, left bronchus or lung: Secondary | ICD-10-CM

## 2017-05-08 DIAGNOSIS — Z9223 Personal history of estrogen therapy: Secondary | ICD-10-CM

## 2017-05-08 DIAGNOSIS — R5383 Other fatigue: Secondary | ICD-10-CM

## 2017-05-08 DIAGNOSIS — Z79899 Other long term (current) drug therapy: Secondary | ICD-10-CM

## 2017-05-08 DIAGNOSIS — M546 Pain in thoracic spine: Secondary | ICD-10-CM

## 2017-05-08 DIAGNOSIS — R531 Weakness: Secondary | ICD-10-CM | POA: Diagnosis not present

## 2017-05-08 DIAGNOSIS — R5381 Other malaise: Secondary | ICD-10-CM

## 2017-05-08 DIAGNOSIS — J90 Pleural effusion, not elsewhere classified: Secondary | ICD-10-CM

## 2017-05-08 DIAGNOSIS — M899 Disorder of bone, unspecified: Secondary | ICD-10-CM

## 2017-05-08 DIAGNOSIS — E538 Deficiency of other specified B group vitamins: Secondary | ICD-10-CM

## 2017-05-08 DIAGNOSIS — M069 Rheumatoid arthritis, unspecified: Secondary | ICD-10-CM

## 2017-05-08 DIAGNOSIS — I700 Atherosclerosis of aorta: Secondary | ICD-10-CM

## 2017-05-08 DIAGNOSIS — M797 Fibromyalgia: Secondary | ICD-10-CM

## 2017-05-08 DIAGNOSIS — Z9221 Personal history of antineoplastic chemotherapy: Secondary | ICD-10-CM

## 2017-05-08 DIAGNOSIS — I509 Heart failure, unspecified: Secondary | ICD-10-CM

## 2017-05-08 DIAGNOSIS — I110 Hypertensive heart disease with heart failure: Secondary | ICD-10-CM

## 2017-05-08 DIAGNOSIS — Z853 Personal history of malignant neoplasm of breast: Secondary | ICD-10-CM

## 2017-05-08 DIAGNOSIS — Z5112 Encounter for antineoplastic immunotherapy: Secondary | ICD-10-CM

## 2017-05-08 DIAGNOSIS — J449 Chronic obstructive pulmonary disease, unspecified: Secondary | ICD-10-CM

## 2017-05-08 LAB — COMPREHENSIVE METABOLIC PANEL
ALT: 10 U/L — ABNORMAL LOW (ref 14–54)
AST: 20 U/L (ref 15–41)
Albumin: 3.4 g/dL — ABNORMAL LOW (ref 3.5–5.0)
Alkaline Phosphatase: 124 U/L (ref 38–126)
Anion gap: 12 (ref 5–15)
BUN: 12 mg/dL (ref 6–20)
CO2: 24 mmol/L (ref 22–32)
Calcium: 8.9 mg/dL (ref 8.9–10.3)
Chloride: 96 mmol/L — ABNORMAL LOW (ref 101–111)
Creatinine, Ser: 1.03 mg/dL — ABNORMAL HIGH (ref 0.44–1.00)
GFR calc Af Amer: >60 mL/min (ref >60)
GFR calc non Af Amer: 52 mL/min — ABNORMAL LOW (ref >60)
Glucose, Bld: 104 mg/dL — ABNORMAL HIGH (ref 65–99)
Potassium: 4.4 mmol/L (ref 3.5–5.1)
Sodium: 132 mmol/L — ABNORMAL LOW (ref 135–145)
Total Bilirubin: 0.3 mg/dL (ref 0.3–1.2)
Total Protein: 7 g/dL (ref 6.5–8.1)

## 2017-05-08 LAB — URINALYSIS, COMPLETE (UACMP) WITH MICROSCOPIC
BILIRUBIN URINE: NEGATIVE
GLUCOSE, UA: NEGATIVE mg/dL
Ketones, ur: NEGATIVE mg/dL
NITRITE: NEGATIVE
PH: 6.5 (ref 5.0–8.0)
Protein, ur: NEGATIVE mg/dL
SPECIFIC GRAVITY, URINE: 1.01 (ref 1.005–1.030)
Squamous Epithelial / LPF: NONE SEEN (ref 0–5)

## 2017-05-08 LAB — CBC WITH DIFFERENTIAL/PLATELET
Basophils Absolute: 0.1 10*3/uL (ref 0–0.1)
Basophils Relative: 1 %
Eosinophils Absolute: 0.5 10*3/uL (ref 0–0.7)
Eosinophils Relative: 7 %
HCT: 32.8 % — ABNORMAL LOW (ref 35.0–47.0)
Hemoglobin: 11.3 g/dL — ABNORMAL LOW (ref 12.0–16.0)
Lymphocytes Relative: 13 %
Lymphs Abs: 1 10*3/uL (ref 1.0–3.6)
MCH: 31.6 pg (ref 26.0–34.0)
MCHC: 34.4 g/dL (ref 32.0–36.0)
MCV: 91.6 fL (ref 80.0–100.0)
Monocytes Absolute: 0.6 10*3/uL (ref 0.2–0.9)
Monocytes Relative: 9 %
Neutro Abs: 5.2 10*3/uL (ref 1.4–6.5)
Neutrophils Relative %: 70 %
Platelets: 195 10*3/uL (ref 150–440)
RBC: 3.58 MIL/uL — ABNORMAL LOW (ref 3.80–5.20)
RDW: 16.3 % — ABNORMAL HIGH (ref 11.5–14.5)
WBC: 7.4 10*3/uL (ref 3.6–11.0)

## 2017-05-08 MED ORDER — SODIUM CHLORIDE 0.9 % IV SOLN
250.8000 mg | Freq: Once | INTRAVENOUS | Status: AC
  Administered 2017-05-08: 250 mg via INTRAVENOUS
  Filled 2017-05-08: qty 25

## 2017-05-08 MED ORDER — SODIUM CHLORIDE 0.9 % IV SOLN
Freq: Once | INTRAVENOUS | Status: AC
  Administered 2017-05-08: 10:00:00 via INTRAVENOUS
  Filled 2017-05-08: qty 1000

## 2017-05-08 MED ORDER — OXYCODONE HCL 5 MG PO TABS: 5.0000 mg | ORAL_TABLET | Freq: Four times a day (QID) | ORAL | 0 refills | Status: DC | PRN

## 2017-05-08 MED ORDER — SODIUM CHLORIDE 0.9 % IV SOLN
200.0000 mg | Freq: Once | INTRAVENOUS | Status: AC
  Administered 2017-05-08: 200 mg via INTRAVENOUS
  Filled 2017-05-08: qty 8

## 2017-05-08 MED ORDER — OXYCODONE HCL 5 MG PO TABS
5.0000 mg | ORAL_TABLET | Freq: Once | ORAL | Status: AC
  Administered 2017-05-08: 5 mg via ORAL
  Filled 2017-05-08: qty 1

## 2017-05-08 MED ORDER — PACLITAXEL PROTEIN-BOUND CHEMO INJECTION 100 MG
100.0000 mg/m2 | Freq: Once | INTRAVENOUS | Status: AC
  Administered 2017-05-08: 150 mg via INTRAVENOUS
  Filled 2017-05-08: qty 30

## 2017-05-08 MED ORDER — SODIUM CHLORIDE 0.9% FLUSH
10.0000 mL | INTRAVENOUS | Status: DC | PRN
  Administered 2017-05-08: 10 mL via INTRAVENOUS
  Filled 2017-05-08: qty 10

## 2017-05-08 MED ORDER — PALONOSETRON HCL INJECTION 0.25 MG/5ML
0.2500 mg | Freq: Once | INTRAVENOUS | Status: AC
  Administered 2017-05-08: 0.25 mg via INTRAVENOUS
  Filled 2017-05-08: qty 5

## 2017-05-08 MED ORDER — HEPARIN SOD (PORK) LOCK FLUSH 100 UNIT/ML IV SOLN
500.0000 [IU] | Freq: Once | INTRAVENOUS | Status: AC
  Administered 2017-05-08: 500 [IU] via INTRAVENOUS
  Filled 2017-05-08: qty 5

## 2017-05-08 MED ORDER — CYANOCOBALAMIN 1000 MCG/ML IJ SOLN
1000.0000 ug | Freq: Once | INTRAMUSCULAR | Status: AC
  Administered 2017-05-08: 1000 ug via INTRAMUSCULAR
  Filled 2017-05-08: qty 1

## 2017-05-08 MED ORDER — SODIUM CHLORIDE 0.9 % IV SOLN
Freq: Once | INTRAVENOUS | Status: AC
  Administered 2017-05-08: 10:00:00 via INTRAVENOUS
  Filled 2017-05-08: qty 5

## 2017-05-08 NOTE — Telephone Encounter (Signed)
Patient advised she needs to see Urology. Agrees to call for appt

## 2017-05-08 NOTE — Progress Notes (Signed)
Patient here today for follow up.  Patient c/o pain in back 10/10.

## 2017-05-08 NOTE — Telephone Encounter (Signed)
Patient called to report that she has lost control of her bladder twice since leaving here this morning after chemotherapy. She states there is no warning, it just comes and floods everything. Please advise

## 2017-05-08 NOTE — Telephone Encounter (Signed)
She needs to see urology as discussed. She can see Dr. Jacqlyn Larsen, or she can call [facility]'s office back and get in to be seen. Likely has another UTI. UA with C&S sent today.

## 2017-05-08 NOTE — Progress Notes (Signed)
[facility] day:  05/08/2017    Chief Complaint: [name] [name] is a 77 y.o. female with presumed metastatic squamous cell carcinoma of the left lower lobe who is seen for assessment prior to day 1 of cycle #3 carboplatin, Abraxane, and pembrolizumab.  HPI: The patient was last seen in the medical oncology clinic on 04/30/2017.  At that time, she had significant right rib pain with possible pleuritic component.  Decision was made to postpone chemotherapy and perform a STAT chest CT angiogram.  Given her documented allergy to contrast dye, she required premedications.  Chest CT angiogram revealed no pulmonary embolism.  There was a new wedge compression fracture of T7.  She has chronic atelectasis of the left lower lobe and left sided effusion.  She was referred to orthopedics.  She saw Dorise Hiss, PA on 05/07/2017.  MRI of the thoracic spine was ordered, and has been scheduled for 05/15/2017.  During the interim, patient thinks she is getting another UTI. Patient has urinary frequency and dysuria. Patient has been contacted by the urologist, however she has not followed up. Patient continues to have back pain in the setting of a known T7 wedge fracture. Patient using "Tramadol and that same old hydrocodone. It doesn't do anything. I am also taking BC powders".   Patient has a chronic cough (productive). Patient notes that she has had to use her oxygen more this week than she has in awhile. Patient has ulnar neuropathy. She denies any B symptoms. Patient is eating well, with no significant weight loss.   Back pain rated 10/10 in clinic today.    Past Medical History:  Diagnosis Date  . Asthma   . Breast cancer ([place]) 2009   left  . Cancer ([place])   . CHF (congestive heart failure) ([place])   . CHF (congestive heart failure) ([place])   . Chronic UTI   . COPD (chronic obstructive pulmonary disease) ([place])   . Dizziness   . Fibromyalgia   . Hypertension    . Neuropathy   . Personal history of tobacco use, presenting hazards to health 05/17/2015  . Polyp, larynx   . RA (rheumatoid arthritis) ([place])   . Sinus infection    recent  . Stumbling gait    to the left  . Supplemental oxygen dependent    2.5l    Past Surgical History:  Procedure Laterality Date  . ABDOMINAL HYSTERECTOMY    . BREAST LUMPECTOMY Left 2009   chemo and radiation  . CYST EXCISION Left 02/27/2015   Procedure: CYST REMOVAL;  Surgeon: Hessie Knows, MD;  Location: [facility] ORS;  Service: Orthopedics;  Laterality: Left;  . EYE MUSCLE SURGERY Right    13 surgeries  . FLEXIBLE BRONCHOSCOPY N/A 07/01/2016   Procedure: FLEXIBLE BRONCHOSCOPY;  Surgeon: Wilhelmina Mcardle, MD;  Location: [facility] ORS;  Service: Pulmonary;  Laterality: N/A;  . FLEXIBLE BRONCHOSCOPY N/A 02/09/2017   Procedure: FLEXIBLE BRONCHOSCOPY;  Surgeon: [name], MD;  Location: [facility] ORS;  Service: Pulmonary;  Laterality: N/A;  . PORTA CATH INSERTION N/A 03/04/2017   Procedure: PORTA CATH INSERTION;  Surgeon: [name], MD;  Location: [facility];  Service: Cardiovascular;  Laterality: N/A;  . THUMB ARTHROSCOPY Left     Family History  Problem Relation Age of Onset  . Diabetes Father   . Stroke Father   . Heart attack Father   . CAD Sister     Social History:  reports that  she has been smoking cigarettes.  She has a 40.00 pack-year smoking history. She has never used smokeless tobacco. She reports that she does not drink alcohol or use drugs.  He lives with her son and grandson. The patient is alone today.   Allergies:  Allergies  Allergen Reactions  . Contrast Media [Iodinated Diagnostic Agents] Other (See Comments)    Pt was sent to the ED following contrast media injection at [place]. Unknown reason. She has been premedicated since without complications. Pt to be premedicated prior to contrast media injections  . Ioxaglate Other (See Comments)    Pt was sent to the ED  following contrast media injection at [place]. Unknown reason. She has been premedicated since without complications. Pt to be premedicated prior to contrast media injections  . Sulfa Antibiotics Other (See Comments)    Unknown  . Tetracycline Hives  . White Petrolatum Other (See Comments)    Blisters  . Amoxicillin-Pot Clavulanate Rash and Other (See Comments)    Blisters in mouth Has patient had a PCN reaction causing immediate rash, facial/tongue/throat swelling, SOB or lightheadedness with hypotension: No Has patient had a PCN reaction causing severe rash involving mucus membranes or skin necrosis: No Has patient had a PCN reaction that required hospitalization: No Has patient had a PCN reaction occurring within the last 10 years: No If all of the above answers are "NO", then may proceed with Cephalosporin use.   . Tape Rash    Current Medications: Current Outpatient Medications  Medication Sig Dispense Refill  . albuterol (PROVENTIL HFA;VENTOLIN HFA) 108 (90 Base) MCG/ACT inhaler Inhale 1-2 puffs into the lungs every 6 (six) hours as needed for wheezing or shortness of breath. 1 Inhaler 2  . Calcium-Vitamin D 600-200 MG-UNIT tablet Take 1 tablet by mouth daily.     . carvedilol (COREG) 12.5 MG tablet Take 1 tablet (12.5 mg total) by mouth 2 (two) times daily with a meal. 60 tablet 0  . cholecalciferol (VITAMIN D) 1000 units tablet Take 1,000 Units by mouth daily.    . clonazePAM (KLONOPIN) 1 MG tablet Take 1 mg by mouth 2 (two) times daily as needed for anxiety.     [name] dexamethasone (DECADRON) 4 MG tablet Take 2 tablets by mouth once a day on the day after chemotherapy and then take 2 tablets two times a day for 2 days. Take with food. 30 tablet 1  . gabapentin (NEURONTIN) 600 MG tablet Take 600 mg by mouth 3 (three) times daily.     [name] HYDROcodone-acetaminophen (NORCO/VICODIN) 5-325 MG tablet Take 1 tablet by mouth 2 (two) times daily.     [name] imipramine (TOFRANIL) 25 MG  tablet Take 25 mg by mouth at bedtime.     . lidocaine (LIDODERM) 5 % Place 1 patch onto the skin daily. Remove & Discard patch within 12 hours or as directed by MD 30 patch 0  . lidocaine-prilocaine (EMLA) cream Apply 1 application topically as needed. 30 g 3  . LORazepam (ATIVAN) 0.5 MG tablet Take 1 tablet (0.5 mg total) by mouth every 6 (six) hours as needed (Nausea or vomiting). 30 tablet 0  . Multiple Vitamin (MULTIVITAMIN) capsule Take 1 capsule by mouth daily.    . nitroGLYCERIN (NITROSTAT) 0.4 MG SL tablet Place 0.4 mg under the tongue every 5 (five) minutes as needed for chest pain.     [name] ondansetron (ZOFRAN) 8 MG tablet Take 1 tablet (8 mg total) by mouth 2 (two) times daily as  needed. Start on the third day after chemotherapy. 30 tablet 1  . Tiotropium Bromide Monohydrate (SPIRIVA RESPIMAT) 2.5 MCG/ACT AERS Inhale 2.5 mcg into the lungs 2 (two) times daily. 1 Inhaler 0  . traMADol (ULTRAM) 50 MG tablet Take 100 mg by mouth daily.     . vitamin C (ASCORBIC ACID) 500 MG tablet Take 500 mg by mouth daily.    [name] ZINC SULFATE PO Take 1 tablet by mouth daily.     [name] zolpidem (AMBIEN) 5 MG tablet Take 5 mg by mouth at bedtime.     No current facility-administered medications for this visit.    Facility-Administered Medications Ordered in Other Visits  Medication Dose Route Frequency Provider Last Rate Last Dose  . heparin lock flush 100 unit/mL  500 Units Intravenous Once [name], Melissa C, MD      . sodium chloride flush (NS) 0.9 % injection 10 mL  10 mL Intravenous PRN Lequita Asal, MD   10 mL at 05/08/17 0857    Review of Systems:  GENERAL:  Feels "ok" except for back pain.  General fatigue.  No fevers, chills or sweats.  No new weight. PERFORMANCE STATUS (ECOG):  2 HEENT:  No visual changes, runny nose, sore throat, mouth sores or tenderness. Lungs: Chronic shortness of breath (COPD).  Sputum clear with slight yellow tinge.  No hemoptysis.  On oxygen 3 liters/min  [place]. Cardiac:  No chest pain, palpitations, orthopnea, or PND. GI:  Less nausea.  No vomiting, diarrhea, constipation, melena or hematochezia. GU:  No urgency, frequency, dysuria, or hematuria. Musculoskeletal:  Back pain due to compression fracture.  Osteoporosis.  Fibromyalgia.  Joint pain.  No muscle tenderness. Extremities:  No pain or swelling. Skin:  No rashes or skin changes. Neuro:  Neuropathy in 3rd-5th digits RIGHT hand.  No headache, numbness or weakness, balance or coordination issues. Endocrine:  No diabetes, thyroid issues, hot flashes or night sweats. Psych:  No mood changes, depression or anxiety.  Ambien for sleep. Pain: 10/10 back pain. Review of systems:  All other systems reviewed and found to be negative.   Physical Exam: Blood pressure 123/73, pulse 85, temperature (!) 97.1 F (36.2 C), temperature source Tympanic, SpO2 92 %.  GENERAL:  Thin woman sitting comfortably in a wheelchair in the exam room in no acute distress. MENTAL STATUS:  Alert and oriented to person, place and time. HEAD:  Wearing a yellow cap.  Alopecia.  Normocephalic, atraumatic, face symmetric, no Cushingoid features. EYES:  Blue eyes.  Pupils equal round and reactive to light and accomodation.  No conjunctivitis or scleral icterus. ENT:  Oropharynx clear without lesion.  Tongue normal. Mucous membranes moist.  RESPIRATORY:  Decreased breath sounds left base.  Clear to auscultation without rales, wheezes or rhonchi. CARDIOVASCULAR:  Regular rate and rhythm without murmur, rub or gallop. ABDOMEN:  Soft, non-tender, with active bowel sounds, and no hepatosplenomegaly.  No masses. BACK:  Tender to minimal palpation in mid thoracic area.   SKIN:  No rashes, ulcers or lesions. EXTREMITIES: Arthritic changes in hands.  No edema, no skin discoloration or tenderness.  No palpable cords. LYMPH NODES: No palpable cervical, supraclavicular, axillary or inguinal adenopathy  NEUROLOGICAL: Unremarkable. PSYCH:   Appropriate.    Infusion on 05/08/2017  Component Date Value Ref Range Status  . WBC 05/08/2017 7.4  3.6 - 11.0 K/uL Final  . RBC 05/08/2017 3.58* 3.80 - 5.20 MIL/uL Final  . Hemoglobin 05/08/2017 11.3* 12.0 - 16.0 g/dL Final  . HCT  05/08/2017 32.8* 35.0 - 47.0 % Final  . MCV 05/08/2017 91.6  80.0 - 100.0 fL Final  . MCH 05/08/2017 31.6  26.0 - 34.0 pg Final  . MCHC 05/08/2017 34.4  32.0 - 36.0 g/dL Final  . RDW 05/08/2017 16.3* 11.5 - 14.5 % Final  . Platelets 05/08/2017 195  150 - 440 K/uL Final  . Neutrophils Relative % 05/08/2017 70  % Final  . Neutro Abs 05/08/2017 5.2  1.4 - 6.5 K/uL Final  . Lymphocytes Relative 05/08/2017 13  % Final  . Lymphs Abs 05/08/2017 1.0  1.0 - 3.6 K/uL Final  . Monocytes Relative 05/08/2017 9  % Final  . Monocytes Absolute 05/08/2017 0.6  0.2 - 0.9 K/uL Final  . Eosinophils Relative 05/08/2017 7  % Final  . Eosinophils Absolute 05/08/2017 0.5  0 - 0.7 K/uL Final  . Basophils Relative 05/08/2017 1  % Final  . Basophils Absolute 05/08/2017 0.1  0 - 0.1 K/uL Final   Performed at [facility], [address], [place], [place] [address]  . Sodium 05/08/2017 132* 135 - 145 mmol/L Final  . Potassium 05/08/2017 4.4  3.5 - 5.1 mmol/L Final  . Chloride 05/08/2017 96* 101 - 111 mmol/L Final  . CO2 05/08/2017 24  22 - 32 mmol/L Final  . Glucose, Bld 05/08/2017 104* 65 - 99 mg/dL Final  . BUN 05/08/2017 12  6 - 20 mg/dL Final  . Creatinine, Ser 05/08/2017 1.03* 0.44 - 1.00 mg/dL Final  . Calcium 05/08/2017 8.9  8.9 - 10.3 mg/dL Final  . Total Protein 05/08/2017 7.0  6.5 - 8.1 g/dL Final  . Albumin 05/08/2017 3.4* 3.5 - 5.0 g/dL Final  . AST 05/08/2017 20  15 - 41 U/L Final  . ALT 05/08/2017 10* 14 - 54 U/L Final  . Alkaline Phosphatase 05/08/2017 124  38 - 126 U/L Final  . Total Bilirubin 05/08/2017 0.3  0.3 - 1.2 mg/dL Final  . GFR calc non Af Amer 05/08/2017 52* >60 mL/min Final  . GFR calc Af Amer 05/08/2017 >60  >60 mL/min Final   Comment:  (NOTE) The eGFR has been calculated using the CKD EPI equation. This calculation has not been validated in all clinical situations. eGFR's persistently <60 mL/min signify possible Chronic Kidney Disease.   Georgiann Hahn gap 05/08/2017 12  5 - 15 Final   Performed at [facility], [address] [place], [place] [address]    Assessment:  ASHLYNNE SHETTERLY is a 77 y.o. female with presumed metastatic squamous cell lung cancer.  She presented with clinical [id number] squamous cell lung cancer of the left lung s/p bronchoscopy and biopsy on 07/01/2016.  She has a 40 pack year smoking history.  She presented with left lower chest wall pain.  Foundation One testing on 03/13/2017 revealed no reportable alterations in EGFR, RET, ALK, MET, ERBB2, BRAF, and ROS1.    Tumor was MS-stable.  TMB was 3 Muts/Mb.  There was CDKN2A loss, CDKN2B loss, KRAS G12V, MTAP loss, and TP53 R282W.  PD-L1 was 5%.  Chest CT with contrast on 06/11/2016 revealed a 3.2 x 3.0 mass like area of focal opacity in the medial left lower lobe with obliteration of segmental airways to the anterior left lower lobe.  PET scan on 06/27/2016 revealed a 4.3 x 3.2 cm central left lower lobe lung lesion (SUV 14.3) consistent with primary bronchogenic carcinoma.  There was equivocal nodal tissue in the subcarinal station demonstrating mild hypermetabolism (SUV 3.6). There was  more peripheral left lower lobe increased atelectasis with mucoid impaction which is likely secondary to endobronchial obstruction.  There was a small left pleural effusion.  Head MRI on 07/07/2016 revealed no evidence of metastatic disease.  She received radiation from 07/22/2016 - 09/19/2016.  She received 5 weeks of concurrent carboplatin and Taxol (07/24/2016 - 08/11/2016; 09/05/2016 - 09/16/2016). She requires a reduced dose of Benadryl (25 mg) for her premedication.  Week #3 was postponed secondary to chest pain and evaluation.  She missed a couple of weeks.  Chest CT  on 11/21/2016 revealed progressive collapse of the LEFT lower lobe presumably related to central obstructing mass.  Mass obstructed the LEFT lower lobe bronchus, although mass was not well demonstrated.  There were small effusions (no change).  There was centrilobular emphysema unchanged.  There was no evidence pneumonia.  PET scan on 01/27/2017 revealed interval response to therapy.  The previously noted hypermetabolism associated with left lower lobe perihilar lung mass has resolved in the interval.  There was a moderate left pleural effusion that had increased in volume from previous PET-CT and there was now complete atelectasis/consolidation of the left lower lobe, which may obscure residual mass.  There was no new sites of hypermetabolism or evidence of distant metastatic disease.  Bronchoscopy on 02/09/2017 revealed no evidence of endobronchial tumor.  There was stenosis at the entry to the left lower posterior segments which could not be entered.  There was mildly erythematous mucosa with copious secretions in the left lower lobe.  Brushings were positive for non-small cell carcinoma, favor squamous cell carcinoma.  Ultrasound guided thoracentesis on 02/17/2017 revealed was negative for malignancy.  CEA was 7.9 on 04/09/2017.  Bone scan on 02/27/2017 revealed a subtle focus of uptake in the left 7th rib, indeterminate.  She is day 1 of cycle #3 carboplatin, Abraxane and pembrolizumab (03/19/2017 - 04/09/2017).  Cycle #2 was truncated secondary to pneumonia.  She has a normocytic anemia likely due to chemotherapy.  Work-up on 07/24/2016 revealed the following normal labs: ferritin (103), B12 (345), folate(19.7).  Iron saturation was 8% and TIBC was 258.  She has a history of stage IA left breast cancer in 02/2005.  She underwent lumpectomy.  Pathology revealed a T1cN0 lesion.  Tumor was ER + , PR +, and Her2/neu -.  Two sentinel lymph nodes were negative.  She received chemotherapy (4 cycles of AC  and possibly an abbreviated course of Taxol- no records available).  She received radiation.  She completed Femara in 08/2010.  She has fibromyalgia.  She has advanced thoracoabdominal aortic atherosclerosis with fusiform dilatation of the descending thoracic aorta up to 4.3 cm.  She is followed by Dr. Lucky Cowboy.  She has a history of recurrent UTIs.  UA on 03/19/2017 revealed  >100,000 CFU/mL of an ESBL producing E. coli.  She was treated with fosfomycin 3 g x 1 dose.  Chest CT angiogram on 04/30/2017 revealed no pulmonary embolism.  There was a new wedge compression fracture of T7.  She has chronic atelectasis of the left lower lobe and left sided effusion.  Symptomatically, she is doing "ok". She has back pain due to a T7 wedge fracture. She has planned MRI next week, with plans for possible kyphoplasty in the future. Shortness of breath is stable.  She feels like she has another UTI starting.  Exam is stable.    Plan: 1.  Labs today:  CBC with diff, CMP, UA, C&S 2.  Discuss chest CT angiogram- no pulmonary embolism.  New T7 compression fracture. 3.  Discuss orthopedics consult.  Plan for thoracic spine MRI and possible kyphoplasty in the future.  4.  Discuss urinary symptoms. Will send another UA and C&S. Patient to follow up with urology.  5.  Labs reviewed. Blood counts stable and adequate enough for treatment. Will proceed with day 1 of cycle # 3 carboplatin + Abraxane + pembrolizumab. 6.  Discuss pain management. Patient is complaining of pain rated 10/10. D/C Norco and Tramadol. Start Roxicodone IR 5 mg q6 PRN (Disp# 30). Will give dose in clinic today. Patient to maintain a pain diary at home. She has been asked to bring diary with her to her next visit for provider review, with plans to consider long acting opioid therapy in the future.  7.  Continue B12 injections as scheduled.  8.  RTC in 1 week for MD assessment, labs (CBC with diff, CMP), and day 8 Abraxane.   Honor Loh, NP   05/08/2017, 9:34 AM  I saw and evaluated the patient, participating in the key portions of the service and reviewing pertinent diagnostic studies and records.  I reviewed the nurse practitioner's note and agree with the findings and the plan.  The assessment and plan were discussed with the patient.  Multiple questions were asked by the patient and answered.   Nolon Stalls, MD 05/08/2017,9:34 AM

## 2017-05-09 ENCOUNTER — Other Ambulatory Visit: Payer: Self-pay | Admitting: Urgent Care

## 2017-05-09 DIAGNOSIS — M546 Pain in thoracic spine: Secondary | ICD-10-CM | POA: Insufficient documentation

## 2017-05-09 DIAGNOSIS — Z87440 Personal history of urinary (tract) infections: Secondary | ICD-10-CM

## 2017-05-09 DIAGNOSIS — N390 Urinary tract infection, site not specified: Secondary | ICD-10-CM

## 2017-05-09 DIAGNOSIS — Z8619 Personal history of other infectious and parasitic diseases: Secondary | ICD-10-CM

## 2017-05-09 LAB — URINE CULTURE

## 2017-05-14 ENCOUNTER — Ambulatory Visit: Payer: Medicare HMO

## 2017-05-15 ENCOUNTER — Encounter: Payer: Self-pay | Admitting: Hematology and Oncology

## 2017-05-15 ENCOUNTER — Ambulatory Visit
Admission: RE | Admit: 2017-05-15 | Discharge: 2017-05-15 | Disposition: A | Discharge status: Home / Self Care | Payer: Medicare HMO | Source: Ambulatory Visit | Attending: Hematology and Oncology | Admitting: Hematology and Oncology

## 2017-05-15 ENCOUNTER — Inpatient Hospital Stay (HOSPITAL_BASED_OUTPATIENT_CLINIC_OR_DEPARTMENT_OTHER): Payer: Medicare HMO | Admitting: Hematology and Oncology

## 2017-05-15 ENCOUNTER — Inpatient Hospital Stay: Payer: Medicare HMO | Attending: Hematology and Oncology

## 2017-05-15 ENCOUNTER — Ambulatory Visit
Admission: RE | Admit: 2017-05-15 | Discharge: 2017-05-15 | Disposition: A | Discharge status: Home / Self Care | Payer: Medicare HMO | Source: Ambulatory Visit | Attending: Orthopedic Surgery | Admitting: Orthopedic Surgery

## 2017-05-15 ENCOUNTER — Inpatient Hospital Stay: Payer: Medicare HMO

## 2017-05-15 VITALS — BP 112/76 | HR 112 | Temp 97.4°F | Resp 18 | Wt 112.0 lb

## 2017-05-15 DIAGNOSIS — Z853 Personal history of malignant neoplasm of breast: Secondary | ICD-10-CM | POA: Insufficient documentation

## 2017-05-15 DIAGNOSIS — Z5111 Encounter for antineoplastic chemotherapy: Secondary | ICD-10-CM | POA: Diagnosis not present

## 2017-05-15 DIAGNOSIS — R0781 Pleurodynia: Secondary | ICD-10-CM

## 2017-05-15 DIAGNOSIS — M069 Rheumatoid arthritis, unspecified: Secondary | ICD-10-CM

## 2017-05-15 DIAGNOSIS — R350 Frequency of micturition: Secondary | ICD-10-CM | POA: Insufficient documentation

## 2017-05-15 DIAGNOSIS — E538 Deficiency of other specified B group vitamins: Secondary | ICD-10-CM | POA: Diagnosis not present

## 2017-05-15 DIAGNOSIS — Z8619 Personal history of other infectious and parasitic diseases: Secondary | ICD-10-CM

## 2017-05-15 DIAGNOSIS — I700 Atherosclerosis of aorta: Secondary | ICD-10-CM

## 2017-05-15 DIAGNOSIS — J440 Chronic obstructive pulmonary disease with acute lower respiratory infection: Secondary | ICD-10-CM

## 2017-05-15 DIAGNOSIS — R5381 Other malaise: Secondary | ICD-10-CM | POA: Diagnosis not present

## 2017-05-15 DIAGNOSIS — E871 Hypo-osmolality and hyponatremia: Secondary | ICD-10-CM

## 2017-05-15 DIAGNOSIS — J189 Pneumonia, unspecified organism: Secondary | ICD-10-CM

## 2017-05-15 DIAGNOSIS — Z87440 Personal history of urinary (tract) infections: Secondary | ICD-10-CM

## 2017-05-15 DIAGNOSIS — C3432 Malignant neoplasm of lower lobe, left bronchus or lung: Secondary | ICD-10-CM | POA: Insufficient documentation

## 2017-05-15 DIAGNOSIS — S22060A Wedge compression fracture of T7-T8 vertebra, initial encounter for closed fracture: Secondary | ICD-10-CM | POA: Diagnosis not present

## 2017-05-15 DIAGNOSIS — Z9981 Dependence on supplemental oxygen: Secondary | ICD-10-CM | POA: Diagnosis not present

## 2017-05-15 DIAGNOSIS — X58XXXS Exposure to other specified factors, sequela: Secondary | ICD-10-CM | POA: Diagnosis not present

## 2017-05-15 DIAGNOSIS — I10 Essential (primary) hypertension: Secondary | ICD-10-CM | POA: Diagnosis not present

## 2017-05-15 DIAGNOSIS — M797 Fibromyalgia: Secondary | ICD-10-CM | POA: Diagnosis not present

## 2017-05-15 DIAGNOSIS — Z881 Allergy status to other antibiotic agents status: Secondary | ICD-10-CM | POA: Insufficient documentation

## 2017-05-15 DIAGNOSIS — M549 Dorsalgia, unspecified: Secondary | ICD-10-CM | POA: Diagnosis not present

## 2017-05-15 DIAGNOSIS — S22060S Wedge compression fracture of T7-T8 vertebra, sequela: Secondary | ICD-10-CM | POA: Diagnosis not present

## 2017-05-15 DIAGNOSIS — Z1612 Extended spectrum beta lactamase (ESBL) resistance: Secondary | ICD-10-CM | POA: Insufficient documentation

## 2017-05-15 DIAGNOSIS — D649 Anemia, unspecified: Secondary | ICD-10-CM

## 2017-05-15 DIAGNOSIS — F17210 Nicotine dependence, cigarettes, uncomplicated: Secondary | ICD-10-CM | POA: Diagnosis not present

## 2017-05-15 DIAGNOSIS — I110 Hypertensive heart disease with heart failure: Secondary | ICD-10-CM | POA: Insufficient documentation

## 2017-05-15 DIAGNOSIS — N390 Urinary tract infection, site not specified: Secondary | ICD-10-CM

## 2017-05-15 DIAGNOSIS — Z880 Allergy status to penicillin: Secondary | ICD-10-CM | POA: Insufficient documentation

## 2017-05-15 DIAGNOSIS — R059 Cough, unspecified: Secondary | ICD-10-CM

## 2017-05-15 DIAGNOSIS — I509 Heart failure, unspecified: Secondary | ICD-10-CM | POA: Diagnosis not present

## 2017-05-15 DIAGNOSIS — R05 Cough: Secondary | ICD-10-CM

## 2017-05-15 DIAGNOSIS — Z79899 Other long term (current) drug therapy: Secondary | ICD-10-CM | POA: Diagnosis not present

## 2017-05-15 DIAGNOSIS — X58XXXA Exposure to other specified factors, initial encounter: Secondary | ICD-10-CM | POA: Diagnosis not present

## 2017-05-15 DIAGNOSIS — Z882 Allergy status to sulfonamides status: Secondary | ICD-10-CM | POA: Insufficient documentation

## 2017-05-15 DIAGNOSIS — M899 Disorder of bone, unspecified: Secondary | ICD-10-CM

## 2017-05-15 DIAGNOSIS — M542 Cervicalgia: Secondary | ICD-10-CM | POA: Diagnosis not present

## 2017-05-15 DIAGNOSIS — R5382 Chronic fatigue, unspecified: Secondary | ICD-10-CM | POA: Insufficient documentation

## 2017-05-15 DIAGNOSIS — J90 Pleural effusion, not elsewhere classified: Secondary | ICD-10-CM

## 2017-05-15 DIAGNOSIS — Z7189 Other specified counseling: Secondary | ICD-10-CM

## 2017-05-15 DIAGNOSIS — Z923 Personal history of irradiation: Secondary | ICD-10-CM | POA: Insufficient documentation

## 2017-05-15 DIAGNOSIS — Z9221 Personal history of antineoplastic chemotherapy: Secondary | ICD-10-CM | POA: Insufficient documentation

## 2017-05-15 LAB — COMPREHENSIVE METABOLIC PANEL
ALT: 14 U/L (ref 14–54)
AST: 19 U/L (ref 15–41)
Albumin: 3.4 g/dL — ABNORMAL LOW (ref 3.5–5.0)
Alkaline Phosphatase: 109 U/L (ref 38–126)
Anion gap: 10 (ref 5–15)
BUN: 16 mg/dL (ref 6–20)
CO2: 27 mmol/L (ref 22–32)
Calcium: 8.8 mg/dL — ABNORMAL LOW (ref 8.9–10.3)
Chloride: 91 mmol/L — ABNORMAL LOW (ref 101–111)
Creatinine, Ser: 0.96 mg/dL (ref 0.44–1.00)
GFR calc Af Amer: >60 mL/min (ref >60)
GFR calc non Af Amer: 57 mL/min — ABNORMAL LOW (ref >60)
Glucose, Bld: 115 mg/dL — ABNORMAL HIGH (ref 65–99)
Potassium: 4.3 mmol/L (ref 3.5–5.1)
Sodium: 128 mmol/L — ABNORMAL LOW (ref 135–145)
Total Bilirubin: 0.7 mg/dL (ref 0.3–1.2)
Total Protein: 6.9 g/dL (ref 6.5–8.1)

## 2017-05-15 LAB — CBC WITH DIFFERENTIAL/PLATELET
Basophils Absolute: 0 10*3/uL (ref 0–0.1)
Basophils Relative: 1 %
Eosinophils Absolute: 0.2 10*3/uL (ref 0–0.7)
Eosinophils Relative: 4 %
HCT: 31.6 % — ABNORMAL LOW (ref 35.0–47.0)
Hemoglobin: 11 g/dL — ABNORMAL LOW (ref 12.0–16.0)
Lymphocytes Relative: 19 %
Lymphs Abs: 0.7 10*3/uL — ABNORMAL LOW (ref 1.0–3.6)
MCH: 31.7 pg (ref 26.0–34.0)
MCHC: 34.9 g/dL (ref 32.0–36.0)
MCV: 90.9 fL (ref 80.0–100.0)
Monocytes Absolute: 0.1 10*3/uL — ABNORMAL LOW (ref 0.2–0.9)
Monocytes Relative: 3 %
Neutro Abs: 2.8 10*3/uL (ref 1.4–6.5)
Neutrophils Relative %: 73 %
Platelets: 154 10*3/uL (ref 150–440)
RBC: 3.48 MIL/uL — ABNORMAL LOW (ref 3.80–5.20)
RDW: 15.2 % — ABNORMAL HIGH (ref 11.5–14.5)
WBC: 3.9 10*3/uL (ref 3.6–11.0)

## 2017-05-15 LAB — EXPECTORATED SPUTUM ASSESSMENT W GRAM STAIN, RFLX TO RESP C

## 2017-05-15 MED ORDER — SODIUM CHLORIDE 0.9 % IV SOLN
Freq: Once | INTRAVENOUS | Status: AC
  Administered 2017-05-15: 10:00:00 via INTRAVENOUS
  Filled 2017-05-15: qty 1000

## 2017-05-15 MED ORDER — PACLITAXEL PROTEIN-BOUND CHEMO INJECTION 100 MG
100.0000 mg/m2 | Freq: Once | Status: AC
  Administered 2017-05-15: 150 mg via INTRAVENOUS
  Filled 2017-05-15: qty 30

## 2017-05-15 MED ORDER — ONDANSETRON HCL 4 MG PO TABS
8.0000 mg | ORAL_TABLET | Freq: Once | ORAL | Status: AC
  Administered 2017-05-15: 8 mg via ORAL
  Filled 2017-05-15: qty 2

## 2017-05-15 MED ORDER — HEPARIN SOD (PORK) LOCK FLUSH 100 UNIT/ML IV SOLN: 500.0000 [IU] | Freq: Once | INTRAVENOUS | Status: DC

## 2017-05-15 MED ORDER — HEPARIN SOD (PORK) LOCK FLUSH 100 UNIT/ML IV SOLN
500.0000 [IU] | Freq: Once | INTRAVENOUS | Status: AC | PRN
  Administered 2017-05-15: 500 [IU]
  Filled 2017-05-15: qty 5

## 2017-05-15 MED ORDER — FENTANYL 12 MCG/HR TD PT72: 12.5000 ug | MEDICATED_PATCH | TRANSDERMAL | 0 refills | Status: DC

## 2017-05-15 MED ORDER — SODIUM CHLORIDE 0.9% FLUSH
10.0000 mL | Freq: Once | INTRAVENOUS | Status: AC
  Administered 2017-05-15: 10 mL via INTRAVENOUS
  Filled 2017-05-15: qty 10

## 2017-05-15 MED ORDER — LEVOFLOXACIN 500 MG PO TABS: 500.0000 mg | ORAL_TABLET | Freq: Every day | ORAL | 0 refills | Status: DC

## 2017-05-15 NOTE — Progress Notes (Signed)
Patient continues to have cough.  States what she coughs up is greenish-yellow.  Also states she continues to have pain in right rib cage.  States pain never goes away it just calms down.  Continues to have urine frequency.

## 2017-05-15 NOTE — Progress Notes (Signed)
[name] day:  05/15/2017    Chief Complaint: [name] [name] is a 77 y.o. female with presumed metastatic squamous cell carcinoma of the left lower lobe who is seen for assessment prior to day 8 of cycle #3 carboplatin, Abraxane, and pembrolizumab.  HPI: The patient was last seen in the medical oncology clinic on 05/08/2017.  At that time, she was doing "ok". She had back pain due to a T7 wedge fracture. MRI was scheduled with plans for possible kyphoplasty in the future. Shortness of breath was stable.  She felt like she had another UTI starting.  Exam was stable. She began cycle #3 chemotherapy.  Regarding her pain, Tramadol and Norco were discontinued.  She began oxycodone 5 mg q 4-6 hours prn.  She was to have kept a pain dairy. Urine culture grew multiple species suggesting contamination.  She was scheduled to see urology.  She received week #2 B12.  She is scheduled for thoracic MRI today.  During the interim, she has continued to have back and right sided rib pain.  She is using a pill on her right side.  She has used the oxycodone 5 mg tablets 3 times a day.  Pain has not improved.  She describes using a Fentanyl patch in the past (25-50 mcg/hr).  She has a chronic cough productive of sputum.  Sputum has changed color and is now brown/yellow/green.  She denies increased shortness of breath.  She continues to have UTI symptoms with frequency.  She has not seen urology.   Past Medical History:  Diagnosis Date  . Asthma   . Breast cancer ([place]) 2009   left  . Cancer ([place])   . CHF (congestive heart failure) ([place])   . CHF (congestive heart failure) ([place])   . Chronic UTI   . COPD (chronic obstructive pulmonary disease) ([place])   . Dizziness   . Fibromyalgia   . Hypertension   . Neuropathy   . Personal history of tobacco use, presenting hazards to health 05/17/2015  . Polyp, larynx   . RA (rheumatoid arthritis) ([place])   . Sinus infection     recent  . Stumbling gait    to the left  . Supplemental oxygen dependent    2.5l    Past Surgical History:  Procedure Laterality Date  . ABDOMINAL HYSTERECTOMY    . BREAST LUMPECTOMY Left 2009   chemo and radiation  . CYST EXCISION Left 02/27/2015   Procedure: CYST REMOVAL;  Surgeon: Hessie Knows, MD;  Location: [facility] OR[facility];  Service: Orthopedics;  Laterality: Left;  . EYE MUSCLE SURGERY Right    13 surgeries  . FLEXIBLE BRONCHOSCOPY N/A 07/01/2016   Procedure: FLEXIBLE BRONCHOSCOPY;  Surgeon: Wilhelmina Mcardle, MD;  Location: [facility] OR[facility];  Service: Pulmonary;  Laterality: N/A;  . FLEXIBLE BRONCHOSCOPY N/A 02/09/2017   Procedure: FLEXIBLE BRONCHOSCOPY;  Surgeon: [name], MD;  Location: [facility] OR[facility];  Service: Pulmonary;  Laterality: N/A;  . PORTA CATH INSERTION N/A 03/04/2017   Procedure: PORTA CATH INSERTION;  Surgeon: [name], MD;  Location: [facility];  Service: Cardiovascular;  Laterality: N/A;  . THUMB ARTHROSCOPY Left     Family History  Problem Relation Age of Onset  . Diabetes Father   . Stroke Father   . Heart attack Father   . CAD Sister     Social History:  reports that she has been smoking cigarettes.  She has a 40.00 pack-year smoking history.  She has never used smokeless tobacco. She reports that she does not drink alcohol or use drugs.  He lives with her son and grandson. The patient is alone today.   Allergies:  Allergies  Allergen Reactions  . Contrast Media [Iodinated Diagnostic Agents] Other (See Comments)    Pt was sent to the ED following contrast media injection at [facility]. Unknown reason. She has been premedicated since without complications. Pt to be premedicated prior to contrast media injections  . Ioxaglate Other (See Comments)    Pt was sent to the ED following contrast media injection at [facility]. Unknown reason. She has been premedicated since without complications. Pt to be premedicated prior to contrast media  injections  . Sulfa Antibiotics Other (See Comments)    Unknown  . Tetracycline Hives  . White Petrolatum Other (See Comments)    Blisters  . Amoxicillin-Pot Clavulanate Rash and Other (See Comments)    Blisters in mouth Has patient had a PCN reaction causing immediate rash, facial/tongue/throat swelling, SOB or lightheadedness with hypotension: No Has patient had a PCN reaction causing severe rash involving mucus membranes or skin necrosis: No Has patient had a PCN reaction that required hospitalization: No Has patient had a PCN reaction occurring within the last 10 years: No If all of the above answers are "NO", then may proceed with Cephalosporin use.   . Tape Rash    Current Medications: Current Outpatient Medications  Medication Sig Dispense Refill  . albuterol (PROVENTIL HFA;VENTOLIN HFA) 108 (90 Base) MCG/ACT inhaler Inhale 1-2 puffs into the lungs every 6 (six) hours as needed for wheezing or shortness of breath. 1 Inhaler 2  . Calcium-Vitamin D 600-200 MG-UNIT tablet Take 1 tablet by mouth daily.     . carvedilol (COREG) 12.5 MG tablet Take 1 tablet (12.5 mg total) by mouth 2 (two) times daily with a meal. 60 tablet 0  . cholecalciferol (VITAMIN D) 1000 units tablet Take 1,000 Units by mouth daily.    . clonazePAM (KLONOPIN) 1 MG tablet Take 1 mg by mouth 2 (two) times daily as needed for anxiety.     [name] dexamethasone (DECADRON) 4 MG tablet Take 2 tablets by mouth once a day on the day after chemotherapy and then take 2 tablets two times a day for 2 days. Take with food. 30 tablet 1  . gabapentin (NEURONTIN) 600 MG tablet Take 600 mg by mouth 3 (three) times daily.     [name] HYDROcodone-acetaminophen (NORCO/VICODIN) 5-325 MG tablet Take 1 tablet by mouth 2 (two) times daily.     [name] imipramine (TOFRANIL) 25 MG tablet Take 25 mg by mouth at bedtime.     . lidocaine (LIDODERM) 5 % Place 1 patch onto the skin daily. Remove & Discard patch within 12 hours or as directed by MD 30 patch 0   . lidocaine-prilocaine (EMLA) cream Apply 1 application topically as needed. 30 g 3  . LORazepam (ATIVAN) 0.5 MG tablet Take 1 tablet (0.5 mg total) by mouth every 6 (six) hours as needed (Nausea or vomiting). 30 tablet 0  . Multiple Vitamin (MULTIVITAMIN) capsule Take 1 capsule by mouth daily.    . nitroGLYCERIN (NITROSTAT) 0.4 MG SL tablet Place 0.4 mg under the tongue every 5 (five) minutes as needed for chest pain.     [name] ondansetron (ZOFRAN) 8 MG tablet Take 1 tablet (8 mg total) by mouth 2 (two) times daily as needed. Start on the third day after chemotherapy. 30 tablet 1  .  oxyCODONE (OXY IR/ROXICODONE) 5 MG immediate release tablet Take 1 tablet (5 mg total) by mouth every 6 (six) hours as needed for severe pain. 30 tablet 0  . Tiotropium Bromide Monohydrate (SPIRIVA RESPIMAT) 2.5 MCG/ACT AERS Inhale 2.5 mcg into the lungs 2 (two) times daily. 1 Inhaler 0  . traMADol (ULTRAM) 50 MG tablet Take 100 mg by mouth daily.     . vitamin C (ASCORBIC ACID) 500 MG tablet Take 500 mg by mouth daily.    [name] ZINC SULFATE PO Take 1 tablet by mouth daily.     [name] zolpidem (AMBIEN) 5 MG tablet Take 5 mg by mouth at bedtime.    . fentaNYL (DURAGESIC - DOSED MCG/HR) 12 MCG/HR Place 1 patch (12.5 mcg total) onto the skin every 3 (three) days for 5 doses. 10 patch 0  . levofloxacin (LEVAQUIN) 500 MG tablet Take 1 tablet (500 mg total) by mouth daily. 10 tablet 0   No current facility-administered medications for this visit.    Facility-Administered Medications Ordered in Other Visits  Medication Dose Route Frequency Provider Last Rate Last Dose  . 0.9 %  sodium chloride infusion   Intravenous Once [name], Melissa C, MD      . heparin lock flush 100 unit/mL  500 Units Intravenous Once [name], Melissa C, MD      . heparin lock flush 100 unit/mL  500 Units Intracatheter Once PRN Mike Gip, Melissa C, MD      . ondansetron (ZOFRAN) tablet 8 mg  8 mg Oral Once Lequita Asal, MD      . PACLitaxel-protein  bound (ABRAXANE) chemo infusion 150 mg  100 mg/m2 (Treatment Plan Recorded) Intravenous Once Lequita Asal, MD        Review of Systems:  GENERAL:  Chronic fatigue.  No fevers, sweats or weight loss.  Weight stable. PERFORMANCE STATUS (ECOG):  2 HEENT:  No visual changes, runny nose, sore throat, mouth sores or tenderness. Lungs: Chronic shortness of breath.  Change in cough (sputum now a little brown, yellow, and green).  No hemoptysis. On 3 liters oxygen via [place]. Cardiac:  No chest pain, palpitations, orthopnea, or PND. GI:  No nausea, vomiting, diarrhea, constipation, melena or hematochezia. GU:  Frequency.  Some urgency.  No dysuria, or hematuria.  Urine sample last week with multiple species. Musculoskeletal:  Back pain due to compression fracture.  Right sided rib pain.  Fibromylagia.  No muscle tenderness. Extremities:  No pain or swelling. Skin:  No rashes or skin changes. Neuro:  No change in neuropathy.  No lower extremity numbness or weakness.  No headache, balance or coordination issues. Endocrine:  No diabetes, thyroid issues, hot flashes or night sweats. Psych:  No mood changes, depression or anxiety.  Ambien for sleep. Pain:  Pain in back and right ribs (10 of 10). Review of systems:  All other systems reviewed and found to be negative.   Physical Exam: Blood pressure 112/76, pulse (!) 112, temperature (!) 97.4 F (36.3 C), temperature source Tympanic, resp. rate 18, weight 112 lb (50.8 kg), SpO2 96 %.  GENERAL:  Thin woman sitting in a wheelchair in the exam room leaning against a small pillow on her right side. MENTAL STATUS:  Alert and oriented to person, place and time. HEAD:  Wearing a cap.  Alopecia.  Normocephalic, atraumatic, face symmetric, no Cushingoid features. EYES:  Blue eyes.  Pupils equal round and reactive to light and accomodation.  No conjunctivitis or scleral icterus. ENT:  Oropharynx clear  without lesion.  Tongue normal. Mucous membranes moist.   RESPIRATORY:  Decreased breath sounds left base.  Upper airway sounds.  Wet cough.  No wheezes or rhonchi. CARDIOVASCULAR:  Regular rate and rhythm without murmur, rub or gallop. ABDOMEN:  Soft, non-tender, with active bowel sounds, and no hepatosplenomegaly.  No masses. BACK/RIBS:  Tender to palpation mid thoracic region and right lateral ribs. SKIN:  No rashes, ulcers or lesions. EXTREMITIES: No edema, no skin discoloration or tenderness.  No palpable cords. LYMPH NODES: No palpable cervical, supraclavicular, axillary or inguinal adenopathy  NEUROLOGICAL: Unremarkable. PSYCH:  Appropriate.    Infusion on 05/15/2017  Component Date Value Ref Range Status  . Sodium 05/15/2017 128* 135 - 145 mmol/L Final  . Potassium 05/15/2017 4.3  3.5 - 5.1 mmol/L Final  . Chloride 05/15/2017 91* 101 - 111 mmol/L Final  . CO2 05/15/2017 27  22 - 32 mmol/L Final  . Glucose, Bld 05/15/2017 115* 65 - 99 mg/dL Final  . BUN 05/15/2017 16  6 - 20 mg/dL Final  . Creatinine, Ser 05/15/2017 0.96  0.44 - 1.00 mg/dL Final  . Calcium 05/15/2017 8.8* 8.9 - 10.3 mg/dL Final  . Total Protein 05/15/2017 6.9  6.5 - 8.1 g/dL Final  . Albumin 05/15/2017 3.4* 3.5 - 5.0 g/dL Final  . AST 05/15/2017 19  15 - 41 U/L Final  . ALT 05/15/2017 14  14 - 54 U/L Final  . Alkaline Phosphatase 05/15/2017 109  38 - 126 U/L Final  . Total Bilirubin 05/15/2017 0.7  0.3 - 1.2 mg/dL Final  . GFR calc non Af Amer 05/15/2017 57* >60 mL/min Final  . GFR calc Af Amer 05/15/2017 >60  >60 mL/min Final   Comment: (NOTE) The eGFR has been calculated using the CKD EPI equation. This calculation has not been validated in all clinical situations. eGFR's persistently <60 mL/min signify possible Chronic Kidney Disease.   Georgiann Hahn gap 05/15/2017 10  5 - 15 Final   Performed at [facility], [address] [place], [place] [address]  . WBC 05/15/2017 3.9  3.6 - 11.0 K/uL Final  . RBC 05/15/2017 3.48* 3.80 - 5.20 MIL/uL Final  .  Hemoglobin 05/15/2017 11.0* 12.0 - 16.0 g/dL Final  . HCT 05/15/2017 31.6* 35.0 - 47.0 % Final  . MCV 05/15/2017 90.9  80.0 - 100.0 fL Final  . MCH 05/15/2017 31.7  26.0 - 34.0 pg Final  . MCHC 05/15/2017 34.9  32.0 - 36.0 g/dL Final  . RDW 05/15/2017 15.2* 11.5 - 14.5 % Final  . Platelets 05/15/2017 154  150 - 440 K/uL Final  . Neutrophils Relative % 05/15/2017 73  % Final  . Neutro Abs 05/15/2017 2.8  1.4 - 6.5 K/uL Final  . Lymphocytes Relative 05/15/2017 19  % Final  . Lymphs Abs 05/15/2017 0.7* 1.0 - 3.6 K/uL Final  . Monocytes Relative 05/15/2017 3  % Final  . Monocytes Absolute 05/15/2017 0.1* 0.2 - 0.9 K/uL Final  . Eosinophils Relative 05/15/2017 4  % Final  . Eosinophils Absolute 05/15/2017 0.2  0 - 0.7 K/uL Final  . Basophils Relative 05/15/2017 1  % Final  . Basophils Absolute 05/15/2017 0.0  0 - 0.1 K/uL Final   Performed at [facility], [address] [place], [place] [address]    Assessment:  [name] [name] is a 77 y.o. female with presumed metastatic squamous cell lung cancer.  She presented with clinical [id number] squamous cell lung cancer of the left lung s/p  bronchoscopy and biopsy on 07/01/2016.  She has a 40 pack year smoking history.  She presented with left lower chest wall pain.  Foundation One testing on 03/13/2017 revealed no reportable alterations in EGFR, RET, ALK, MET, ERBB2, BRAF, and ROS1.    Tumor was MS-stable.  TMB was 3 Muts/Mb.  There was CDKN2A loss, CDKN2B loss, KRAS G12V, MTAP loss, and TP53 R282W.  PD-L1 was 5%.  Chest CT with contrast on 06/11/2016 revealed a 3.2 x 3.0 mass like area of focal opacity in the medial left lower lobe with obliteration of segmental airways to the anterior left lower lobe.  PET scan on 06/27/2016 revealed a 4.3 x 3.2 cm central left lower lobe lung lesion (SUV 14.3) consistent with primary bronchogenic carcinoma.  There was equivocal nodal tissue in the subcarinal station demonstrating mild hypermetabolism (SUV  3.6). There was more peripheral left lower lobe increased atelectasis with mucoid impaction which is likely secondary to endobronchial obstruction.  There was a small left pleural effusion.  Head MRI on 07/07/2016 revealed no evidence of metastatic disease.  She received radiation from 07/22/2016 - 09/19/2016.  She received 5 weeks of concurrent carboplatin and Taxol (07/24/2016 - 08/11/2016; 09/05/2016 - 09/16/2016). She requires a reduced dose of Benadryl (25 mg) for her premedication.  Week #3 was postponed secondary to chest pain and evaluation.  She missed a couple of weeks.  Chest CT on 11/21/2016 revealed progressive collapse of the LEFT lower lobe presumably related to central obstructing mass.  Mass obstructed the LEFT lower lobe bronchus, although mass was not well demonstrated.  There were small effusions (no change).  There was centrilobular emphysema unchanged.  There was no evidence pneumonia.  PET scan on 01/27/2017 revealed interval response to therapy.  The previously noted hypermetabolism associated with left lower lobe perihilar lung mass has resolved in the interval.  There was a moderate left pleural effusion that had increased in volume from previous PET-CT and there was now complete atelectasis/consolidation of the left lower lobe, which may obscure residual mass.  There was no new sites of hypermetabolism or evidence of distant metastatic disease.  Bronchoscopy on 02/09/2017 revealed no evidence of endobronchial tumor.  There was stenosis at the entry to the left lower posterior segments which could not be entered.  There was mildly erythematous mucosa with copious secretions in the left lower lobe.  Brushings were positive for non-small cell carcinoma, favor squamous cell carcinoma.  Ultrasound guided thoracentesis on 02/17/2017 revealed was negative for malignancy.  CEA was 7.9 on 04/09/2017.  Bone scan on 02/27/2017 revealed a subtle focus of uptake in the left 7th rib,  indeterminate.  She is day 8 of cycle #3 carboplatin, Abraxane and pembrolizumab (03/19/2017 - 05/08/2017).  Cycle #2 was truncated secondary to pneumonia.  She has a normocytic anemia likely due to chemotherapy.  Work-up on 07/24/2016 revealed the following normal labs: ferritin (103), B12 (345), folate(19.7).  Iron saturation was 8% and TIBC was 258.  She has B12 deficiency.  B12 was 254 on 04/16/2017 and MMA 789 (high) on 04/23/2017.  She began weekly B12 on 04/30/2016 (last 05/08/2017).  She has a history of stage IA left breast cancer in 02/2005.  She underwent lumpectomy.  Pathology revealed a T1cN0 lesion.  Tumor was ER + , PR +, and Her2/neu -.  Two sentinel lymph nodes were negative.  She received chemotherapy (4 cycles of AC and possibly an abbreviated course of Taxol- no records available).  She received radiation.  She  completed Femara in 08/2010.  She has fibromyalgia.  She has advanced thoracoabdominal aortic atherosclerosis with fusiform dilatation of the descending thoracic aorta up to 4.3 cm.  She is followed by Dr. Lucky Cowboy.  She has a history of recurrent UTIs.  UA on 03/19/2017 revealed  >100,000 CFU/mL of an ESBL producing E. coli.  She was treated with fosfomycin 3 g x 1 dose.  Chest CT angiogram on 04/30/2017 revealed no pulmonary embolism.  There was a new wedge compression fracture of T7.  She has chronic atelectasis of the left lower lobe and left sided effusion.  Symptomatically, she is fatigued. She has back pain due to a T7 wedge fracture and right sided rib pain (unclear etiology).  Pain is poorly controlled with oxycodone 3x/day.  Shortness of breath is stable.  Cough has changed (brown, yellow, green).  She is at risk for a post-obstructive pneumonia.  She has no fevers.  She feels like she has another UTI.  Urine culture last week grew multiple species.  She has not seen urology.  She has hyponatremia (Na 128).  Plan: 1.  Labs today:  CBC with diff, CMP. 2.  Labs  reviewed. Blood counts stable and adequate for treatment. Will proceed with day 8 of cycle # 3 Abraxane today. 3.  Week #3 B12 today. 4.  Urinalysis culture and sensitivity. 5.  Assist with urology consultation. 6.  Sputum for culture and sensitivity. 7.  Begin empiric Levaquin 500 mg a day.  Patient has tolerated in the past (h/o multiple allergies). 8.  Plain films of right ribs today. 9.  Increase oxycodone to 5 mg po q 3-4 hours prn pain. 10.  Rx: Fentanyl 12 mcg/hr patch.  Apply topically every 3 days.  Titrate dose based on pain medication use and pain diary. 11.  Follow-up thoracic spine MRI today. 12.  RTC in 1 week for MD assessment, labs (CBC with diff, BMP), adjustment in pain medications.   Lequita Asal, MD  05/15/2017, 9:45 AM

## 2017-05-16 DIAGNOSIS — I5022 Chronic systolic (congestive) heart failure: Secondary | ICD-10-CM | POA: Diagnosis not present

## 2017-05-17 ENCOUNTER — Telehealth: Payer: Self-pay | Admitting: Hematology and Oncology

## 2017-05-17 LAB — CULTURE, RESPIRATORY W GRAM STAIN

## 2017-05-17 NOTE — Telephone Encounter (Signed)
  Re:  Urine culture and sputum culture  Attempted to call patient and her son regarding cultures collected in the clinic.  Message left on phone to call back.  Sputum culture is growing Cornybacterium striatum.  Urine culture is growing E coli ESBL, multi-resistant.   Lequita Asal, MD

## 2017-05-18 ENCOUNTER — Telehealth: Payer: Self-pay | Admitting: *Deleted

## 2017-05-18 ENCOUNTER — Telehealth: Payer: Self-pay | Admitting: Hematology and Oncology

## 2017-05-18 NOTE — Telephone Encounter (Signed)
Patient returned call from Dr Mike Gip

## 2017-05-18 NOTE — Progress Notes (Deleted)
* [name]   Synopsis: Patient was diagnosed with squamous cell lung cancer on 07/01/16 by bronchoscopy.  She [id number] sqamous cell lung cancer  status post chemotherapy radiation with persistence seen on bronch on 02/09/17   Assessment and Plan:  Left lower lobe atelectasis secondary to lung cancer. - Currently the patient appears asymptomatic, her LLL atelectasis appears unchanged from previous CT in November 2018.  --Continue chemo per Oncology  COPD, with chronic respiratory failure, dyspnea on exertion. - Continue Spiriva, added rescue inhaler.  --Asked to use mucinex-D, and daily azithromycin 250 mg daily.   Excessive daytime sleepiness.  --May be due to sleep apnea vs. Klonopin/ambien combo.  --Cut down on one of the above meds.   Nicotine Abuse.  --this is also likely contributing to her dyspnea.  --Discussed the importance of smoking cessation, spent 3 min in discussion.   No orders of the defined types were placed in this encounter.  No follow-ups on file.  Date: 05/18/2017  MRN# [id number] [name] [name] January 20, 1940   [name] [name] is a 77 y.o. old female seen in follow up for chief complaint of  No chief complaint on file.   Subjective:  The patient is back today as follow up, her most recent PET scan shows persistent LLL atelectasis, however there is no residual uptake. She underwent re-bronch on 02/09/17 which showed residual cancer in the LLL.   She wears oxygen at 3L at night, she does not wear any during the day.  Her course has been complicated by T7 wedge fracture. She is sleepy during the day, she has been tested for OSA which was negative several years ago.   **Bronch 07/01/16 positive for SqCa.  **Re-Bronch 02/09/17, positive for SqCa. **PET scan 01/27/17 on comparison with CT chest 11/21/16 shows minimally changed LLL atelectasis with small pleural effusion in the left.   Desat walk 12/01/16; Baseline sat on RA at rest; sat 94% and  HR 92;  After walking 360 feet she felt mild dyspnea, sat was 92% and HR 97.   CT chest 11/21/16, and comparison with previous on 06/11/16, there is complete left lower lobe atelectasis secondary to obstruction of the left lower lobe bronchus.  On previous CT chest and on previous bronchoscopy this was partially obstructed, this now appears to be completely obstructed.    Medication:    Current Outpatient Medications:  .  albuterol (PROVENTIL HFA;VENTOLIN HFA) 108 (90 Base) MCG/ACT inhaler, Inhale 1-2 puffs into the lungs every 6 (six) hours as needed for wheezing or shortness of breath., Disp: 1 Inhaler, Rfl: 2 .  Calcium-Vitamin D 600-200 MG-UNIT tablet, Take 1 tablet by mouth daily. , Disp: , Rfl:  .  carvedilol (COREG) 12.5 MG tablet, Take 1 tablet (12.5 mg total) by mouth 2 (two) times daily with a meal., Disp: 60 tablet, Rfl: 0 .  cholecalciferol (VITAMIN D) 1000 units tablet, Take 1,000 Units by mouth daily., Disp: , Rfl:  .  clonazePAM (KLONOPIN) 1 MG tablet, Take 1 mg by mouth 2 (two) times daily as needed for anxiety. , Disp: , Rfl:  .  dexamethasone (DECADRON) 4 MG tablet, Take 2 tablets by mouth once a day on the day after chemotherapy and then take 2 tablets two times a day for 2 days. Take with food., Disp: 30 tablet, Rfl: 1 .  fentaNYL (DURAGESIC - DOSED MCG/HR) 12 MCG/HR, Place 1 patch (12.5 mcg total) onto the skin every 3 (three) days for 5 doses., Disp: 10  patch, Rfl: 0 .  gabapentin (NEURONTIN) 600 MG tablet, Take 600 mg by mouth 3 (three) times daily. , Disp: , Rfl:  .  HYDROcodone-acetaminophen (NORCO/VICODIN) 5-325 MG tablet, Take 1 tablet by mouth 2 (two) times daily. , Disp: , Rfl:  .  imipramine (TOFRANIL) 25 MG tablet, Take 25 mg by mouth at bedtime. , Disp: , Rfl:  .  levofloxacin (LEVAQUIN) 500 MG tablet, Take 1 tablet (500 mg total) by mouth daily., Disp: 10 tablet, Rfl: 0 .  lidocaine (LIDODERM) 5 %, Place 1 patch onto the skin daily. Remove & Discard patch within 12  hours or as directed by MD, Disp: 30 patch, Rfl: 0 .  lidocaine-prilocaine (EMLA) cream, Apply 1 application topically as needed., Disp: 30 g, Rfl: 3 .  LORazepam (ATIVAN) 0.5 MG tablet, Take 1 tablet (0.5 mg total) by mouth every 6 (six) hours as needed (Nausea or vomiting)., Disp: 30 tablet, Rfl: 0 .  Multiple Vitamin (MULTIVITAMIN) capsule, Take 1 capsule by mouth daily., Disp: , Rfl:  .  nitroGLYCERIN (NITROSTAT) 0.4 MG SL tablet, Place 0.4 mg under the tongue every 5 (five) minutes as needed for chest pain. , Disp: , Rfl:  .  ondansetron (ZOFRAN) 8 MG tablet, Take 1 tablet (8 mg total) by mouth 2 (two) times daily as needed. Start on the third day after chemotherapy., Disp: 30 tablet, Rfl: 1 .  oxyCODONE (OXY IR/ROXICODONE) 5 MG immediate release tablet, Take 1 tablet (5 mg total) by mouth every 6 (six) hours as needed for severe pain., Disp: 30 tablet, Rfl: 0 .  Tiotropium Bromide Monohydrate (SPIRIVA RESPIMAT) 2.5 MCG/ACT AERS, Inhale 2.5 mcg into the lungs 2 (two) times daily., Disp: 1 Inhaler, Rfl: 0 .  traMADol (ULTRAM) 50 MG tablet, Take 100 mg by mouth daily. , Disp: , Rfl:  .  vitamin C (ASCORBIC ACID) 500 MG tablet, Take 500 mg by mouth daily., Disp: , Rfl:  .  ZINC SULFATE PO, Take 1 tablet by mouth daily. , Disp: , Rfl:  .  zolpidem (AMBIEN) 5 MG tablet, Take 5 mg by mouth at bedtime., Disp: , Rfl:   Allergies:  Contrast media [iodinated diagnostic agents]; Ioxaglate; Sulfa antibiotics; Tetracycline; White petrolatum; Amoxicillin-pot clavulanate; and Tape  Review of Systems: Gen:  Denies  fever, sweats. HEENT: Denies blurred vision. Cvc:  No dizziness, chest pain or heaviness Resp:   Denies cough or sputum porduction. Gi: Denies swallowing difficulty, stomach pain. constipation, bowel incontinence Gu:  Denies bladder incontinence, burning urine Ext:   No Joint pain, stiffness. Skin: No skin rash, easy bruising. Endoc:  No polyuria, polydipsia. Psych: No depression,  insomnia. Other:  All other systems were reviewed and found to be negative other than what is mentioned in the HPI.   Physical Examination:   VS: There were no vitals taken for this visit.  General Appearance: No distress  Neuro:without focal findings,  speech normal,  HEENT: PERRLA, EOM intact. Pulmonary: Continued Decreased air entry left base. CardiovascularNormal S1,S2.  No m/r/g.   Abdomen: Benign, Soft, non-tender. Renal:  No costovertebral tenderness  GU:  Not performed at this time. Endoc: No evident thyromegaly, no signs of acromegaly. Skin:   warm, no rash. Extremities: normal, no cyanosis, clubbing.   LABORATORY PANEL:   CBC Recent Labs  Lab 05/15/17 0803  WBC 3.9  HGB 11.0*  HCT 31.6*  PLT 154   ------------------------------------------------------------------------------------------------------------------  Chemistries  Recent Labs  Lab 05/15/17 0803  NA 128*  K 4.3  CL  91*  CO2 27  GLUCOSE 115*  BUN 16  CREATININE 0.96  CALCIUM 8.8*  AST 19  ALT 14  ALKPHOS 109  BILITOT 0.7   ------------------------------------------------------------------------------------------------------------------  Cardiac Enzymes No results for input(s): TROPONINI in the last 168 hours. ------------------------------------------------------------  RADIOLOGY:   No results found for this or any previous visit. Results for orders placed during the hospital encounter of 10/28/16  DG Chest 2 View   Narrative CLINICAL DATA:  Off and chest congestion for the past 2 weeks with fever. History of COPD -asthma, current smoker, CHF.  EXAM: CHEST  2 VIEW  COMPARISON:  Chest x-ray of August 07, 2016  FINDINGS: The right lung is mildly hyperinflated. There is a tiny right pleural effusion. On the left there is a small pleural effusion. There is left basilar atelectasis or infiltrate. The heart and pulmonary vascularity are normal. There is calcification in the wall of  the aortic arch. There is mild multilevel degenerative disc disease of the thoracic spine.  IMPRESSION: Left lower lobe atelectasis or pneumonia with small left pleural effusion. Tiny right pleural effusion. Underlying COPD. No overt CHF. Followup PA and lateral chest X-ray is recommended in 3-4 weeks following trial of antibiotic therapy to ensure resolution and exclude underlying malignancy.  Thoracic aortic atherosclerosis.   Electronically Signed   By: David  Martinique M.D.   On: 10/28/2016 12:18    ------------------------------------------------------------------------------------------------------------------  Thank  you for allowing [facility] to assist in the care of your patient. Our recommendations are noted above.  Please contact us if we can be of further service.   [name], MD.  [facility] Office Number: [phone]  Patricia Pesa, M.D.  Merton Border, M.D  05/18/2017

## 2017-05-18 NOTE — Telephone Encounter (Signed)
  Re:  Infections and pain  I spoke to Ms Christine regarding consultation with Dr. Ola Spurr, ID regarding her sputum and urine culture.  She noted rib pain.  She is using her Fentanyl patch, but is not using her prn oral pain medication.  I encouraged her to keep a pain diary.  Bone scan, CT angiogram, thoracic spine MRI were reviewed.   Lequita Asal, MD

## 2017-05-18 NOTE — Telephone Encounter (Signed)
Called patient to inform her that Dr. Blane Ohara office will be contacting her regarding her sputum and urine cultures.

## 2017-05-19 ENCOUNTER — Ambulatory Visit: Payer: Medicare HMO | Admitting: Internal Medicine

## 2017-05-21 ENCOUNTER — Inpatient Hospital Stay (HOSPITAL_BASED_OUTPATIENT_CLINIC_OR_DEPARTMENT_OTHER): Payer: Medicare HMO | Admitting: Hematology and Oncology

## 2017-05-21 ENCOUNTER — Ambulatory Visit: Payer: Medicare HMO

## 2017-05-21 ENCOUNTER — Other Ambulatory Visit: Payer: Self-pay

## 2017-05-21 ENCOUNTER — Telehealth: Payer: Self-pay | Admitting: *Deleted

## 2017-05-21 ENCOUNTER — Inpatient Hospital Stay: Payer: Medicare HMO

## 2017-05-21 ENCOUNTER — Inpatient Hospital Stay
Admission: AD | Admit: 2017-05-21 | Discharge: 2017-05-24 | DRG: 477 | Disposition: A | Discharge status: Home / Self Care | Payer: Medicare HMO | Source: Ambulatory Visit | Attending: Internal Medicine | Admitting: Internal Medicine

## 2017-05-21 ENCOUNTER — Encounter: Payer: Self-pay | Admitting: Hematology and Oncology

## 2017-05-21 VITALS — BP 100/62 | HR 113 | Temp 98.7°F | Resp 18 | Wt 112.3 lb

## 2017-05-21 DIAGNOSIS — E538 Deficiency of other specified B group vitamins: Secondary | ICD-10-CM | POA: Diagnosis not present

## 2017-05-21 DIAGNOSIS — C7951 Secondary malignant neoplasm of bone: Principal | ICD-10-CM | POA: Diagnosis present

## 2017-05-21 DIAGNOSIS — Z91048 Other nonmedicinal substance allergy status: Secondary | ICD-10-CM

## 2017-05-21 DIAGNOSIS — M1990 Unspecified osteoarthritis, unspecified site: Secondary | ICD-10-CM | POA: Diagnosis not present

## 2017-05-21 DIAGNOSIS — M069 Rheumatoid arthritis, unspecified: Secondary | ICD-10-CM

## 2017-05-21 DIAGNOSIS — T451X5A Adverse effect of antineoplastic and immunosuppressive drugs, initial encounter: Secondary | ICD-10-CM | POA: Diagnosis present

## 2017-05-21 DIAGNOSIS — D701 Agranulocytosis secondary to cancer chemotherapy: Secondary | ICD-10-CM

## 2017-05-21 DIAGNOSIS — N189 Chronic kidney disease, unspecified: Secondary | ICD-10-CM | POA: Diagnosis not present

## 2017-05-21 DIAGNOSIS — F17210 Nicotine dependence, cigarettes, uncomplicated: Secondary | ICD-10-CM | POA: Diagnosis present

## 2017-05-21 DIAGNOSIS — Z79899 Other long term (current) drug therapy: Secondary | ICD-10-CM

## 2017-05-21 DIAGNOSIS — R8271 Bacteriuria: Secondary | ICD-10-CM | POA: Diagnosis not present

## 2017-05-21 DIAGNOSIS — J9610 Chronic respiratory failure, unspecified whether with hypoxia or hypercapnia: Secondary | ICD-10-CM | POA: Diagnosis not present

## 2017-05-21 DIAGNOSIS — D6959 Other secondary thrombocytopenia: Secondary | ICD-10-CM | POA: Diagnosis present

## 2017-05-21 DIAGNOSIS — R311 Benign essential microscopic hematuria: Secondary | ICD-10-CM | POA: Diagnosis not present

## 2017-05-21 DIAGNOSIS — F419 Anxiety disorder, unspecified: Secondary | ICD-10-CM | POA: Diagnosis present

## 2017-05-21 DIAGNOSIS — E871 Hypo-osmolality and hyponatremia: Secondary | ICD-10-CM | POA: Diagnosis not present

## 2017-05-21 DIAGNOSIS — S22060S Wedge compression fracture of T7-T8 vertebra, sequela: Secondary | ICD-10-CM

## 2017-05-21 DIAGNOSIS — D702 Other drug-induced agranulocytosis: Secondary | ICD-10-CM | POA: Diagnosis not present

## 2017-05-21 DIAGNOSIS — B9620 Unspecified Escherichia coli [E. coli] as the cause of diseases classified elsewhere: Secondary | ICD-10-CM | POA: Diagnosis present

## 2017-05-21 DIAGNOSIS — Z1612 Extended spectrum beta lactamase (ESBL) resistance: Secondary | ICD-10-CM | POA: Diagnosis not present

## 2017-05-21 DIAGNOSIS — R059 Cough, unspecified: Secondary | ICD-10-CM

## 2017-05-21 DIAGNOSIS — I110 Hypertensive heart disease with heart failure: Secondary | ICD-10-CM | POA: Diagnosis present

## 2017-05-21 DIAGNOSIS — S22060A Wedge compression fracture of T7-T8 vertebra, initial encounter for closed fracture: Secondary | ICD-10-CM | POA: Diagnosis not present

## 2017-05-21 DIAGNOSIS — D649 Anemia, unspecified: Secondary | ICD-10-CM

## 2017-05-21 DIAGNOSIS — Z419 Encounter for procedure for purposes other than remedying health state, unspecified: Secondary | ICD-10-CM

## 2017-05-21 DIAGNOSIS — J440 Chronic obstructive pulmonary disease with acute lower respiratory infection: Secondary | ICD-10-CM

## 2017-05-21 DIAGNOSIS — Z881 Allergy status to other antibiotic agents status: Secondary | ICD-10-CM

## 2017-05-21 DIAGNOSIS — I509 Heart failure, unspecified: Secondary | ICD-10-CM | POA: Diagnosis not present

## 2017-05-21 DIAGNOSIS — J189 Pneumonia, unspecified organism: Secondary | ICD-10-CM

## 2017-05-21 DIAGNOSIS — J449 Chronic obstructive pulmonary disease, unspecified: Secondary | ICD-10-CM | POA: Diagnosis present

## 2017-05-21 DIAGNOSIS — I6930 Unspecified sequelae of cerebral infarction: Secondary | ICD-10-CM | POA: Diagnosis not present

## 2017-05-21 DIAGNOSIS — R0781 Pleurodynia: Secondary | ICD-10-CM | POA: Diagnosis not present

## 2017-05-21 DIAGNOSIS — Z79891 Long term (current) use of opiate analgesic: Secondary | ICD-10-CM

## 2017-05-21 DIAGNOSIS — R7881 Bacteremia: Secondary | ICD-10-CM | POA: Diagnosis present

## 2017-05-21 DIAGNOSIS — Z9981 Dependence on supplemental oxygen: Secondary | ICD-10-CM | POA: Diagnosis not present

## 2017-05-21 DIAGNOSIS — S22009A Unspecified fracture of unspecified thoracic vertebra, initial encounter for closed fracture: Secondary | ICD-10-CM | POA: Diagnosis not present

## 2017-05-21 DIAGNOSIS — Z880 Allergy status to penicillin: Secondary | ICD-10-CM

## 2017-05-21 DIAGNOSIS — M4854XA Collapsed vertebra, not elsewhere classified, thoracic region, initial encounter for fracture: Secondary | ICD-10-CM | POA: Diagnosis not present

## 2017-05-21 DIAGNOSIS — I739 Peripheral vascular disease, unspecified: Secondary | ICD-10-CM | POA: Diagnosis not present

## 2017-05-21 DIAGNOSIS — R3129 Other microscopic hematuria: Secondary | ICD-10-CM | POA: Diagnosis present

## 2017-05-21 DIAGNOSIS — Z853 Personal history of malignant neoplasm of breast: Secondary | ICD-10-CM

## 2017-05-21 DIAGNOSIS — M546 Pain in thoracic spine: Secondary | ICD-10-CM | POA: Diagnosis not present

## 2017-05-21 DIAGNOSIS — B9629 Other Escherichia coli [E. coli] as the cause of diseases classified elsewhere: Secondary | ICD-10-CM | POA: Diagnosis present

## 2017-05-21 DIAGNOSIS — Z923 Personal history of irradiation: Secondary | ICD-10-CM

## 2017-05-21 DIAGNOSIS — Z87440 Personal history of urinary (tract) infections: Secondary | ICD-10-CM

## 2017-05-21 DIAGNOSIS — G709 Myoneural disorder, unspecified: Secondary | ICD-10-CM | POA: Diagnosis present

## 2017-05-21 DIAGNOSIS — Z9221 Personal history of antineoplastic chemotherapy: Secondary | ICD-10-CM

## 2017-05-21 DIAGNOSIS — N3000 Acute cystitis without hematuria: Secondary | ICD-10-CM

## 2017-05-21 DIAGNOSIS — R05 Cough: Secondary | ICD-10-CM

## 2017-05-21 DIAGNOSIS — G629 Polyneuropathy, unspecified: Secondary | ICD-10-CM | POA: Diagnosis present

## 2017-05-21 DIAGNOSIS — C3432 Malignant neoplasm of lower lobe, left bronchus or lung: Secondary | ICD-10-CM

## 2017-05-21 DIAGNOSIS — M797 Fibromyalgia: Secondary | ICD-10-CM

## 2017-05-21 DIAGNOSIS — Z7189 Other specified counseling: Secondary | ICD-10-CM

## 2017-05-21 DIAGNOSIS — A4151 Sepsis due to Escherichia coli [E. coli]: Secondary | ICD-10-CM | POA: Diagnosis not present

## 2017-05-21 DIAGNOSIS — X58XXXS Exposure to other specified factors, sequela: Secondary | ICD-10-CM

## 2017-05-21 DIAGNOSIS — M8458XA Pathological fracture in neoplastic disease, other specified site, initial encounter for fracture: Secondary | ICD-10-CM | POA: Diagnosis not present

## 2017-05-21 DIAGNOSIS — I700 Atherosclerosis of aorta: Secondary | ICD-10-CM

## 2017-05-21 DIAGNOSIS — R5381 Other malaise: Secondary | ICD-10-CM | POA: Diagnosis not present

## 2017-05-21 DIAGNOSIS — I2510 Atherosclerotic heart disease of native coronary artery without angina pectoris: Secondary | ICD-10-CM | POA: Diagnosis present

## 2017-05-21 DIAGNOSIS — M549 Dorsalgia, unspecified: Secondary | ICD-10-CM

## 2017-05-21 DIAGNOSIS — N390 Urinary tract infection, site not specified: Secondary | ICD-10-CM | POA: Diagnosis present

## 2017-05-21 DIAGNOSIS — Z91041 Radiographic dye allergy status: Secondary | ICD-10-CM

## 2017-05-21 DIAGNOSIS — Z5111 Encounter for antineoplastic chemotherapy: Secondary | ICD-10-CM | POA: Diagnosis not present

## 2017-05-21 DIAGNOSIS — Z95828 Presence of other vascular implants and grafts: Secondary | ICD-10-CM

## 2017-05-21 DIAGNOSIS — Z7952 Long term (current) use of systemic steroids: Secondary | ICD-10-CM

## 2017-05-21 DIAGNOSIS — R079 Chest pain, unspecified: Secondary | ICD-10-CM | POA: Diagnosis not present

## 2017-05-21 DIAGNOSIS — N393 Stress incontinence (female) (male): Secondary | ICD-10-CM | POA: Diagnosis present

## 2017-05-21 DIAGNOSIS — I130 Hypertensive heart and chronic kidney disease with heart failure and stage 1 through stage 4 chronic kidney disease, or unspecified chronic kidney disease: Secondary | ICD-10-CM | POA: Diagnosis not present

## 2017-05-21 DIAGNOSIS — C3490 Malignant neoplasm of unspecified part of unspecified bronchus or lung: Secondary | ICD-10-CM | POA: Diagnosis not present

## 2017-05-21 DIAGNOSIS — Z882 Allergy status to sulfonamides status: Secondary | ICD-10-CM

## 2017-05-21 LAB — CBC WITH DIFFERENTIAL/PLATELET
Basophils Absolute: 0 10*3/uL (ref 0–0.1)
Basophils Relative: 1 %
Eosinophils Absolute: 0 10*3/uL (ref 0–0.7)
Eosinophils Relative: 1 %
HCT: 30.5 % — ABNORMAL LOW (ref 35.0–47.0)
Hemoglobin: 10.5 g/dL — ABNORMAL LOW (ref 12.0–16.0)
Lymphocytes Relative: 30 %
Lymphs Abs: 0.6 10*3/uL — ABNORMAL LOW (ref 1.0–3.6)
MCH: 31.6 pg (ref 26.0–34.0)
MCHC: 34.4 g/dL (ref 32.0–36.0)
MCV: 91.9 fL (ref 80.0–100.0)
Monocytes Absolute: 0.3 10*3/uL (ref 0.2–0.9)
Monocytes Relative: 14 %
Neutro Abs: 1 10*3/uL — ABNORMAL LOW (ref 1.4–6.5)
Neutrophils Relative %: 54 %
Platelets: 145 10*3/uL — ABNORMAL LOW (ref 150–440)
RBC: 3.32 MIL/uL — ABNORMAL LOW (ref 3.80–5.20)
RDW: 15.9 % — ABNORMAL HIGH (ref 11.5–14.5)
WBC: 1.9 10*3/uL — ABNORMAL LOW (ref 3.6–11.0)

## 2017-05-21 LAB — URINALYSIS, COMPLETE (UACMP) WITH MICROSCOPIC
Bacteria, UA: NONE SEEN
Bilirubin Urine: NEGATIVE
Glucose, UA: NEGATIVE mg/dL
Hgb urine dipstick: NEGATIVE
KETONES UR: NEGATIVE mg/dL
Nitrite: NEGATIVE
PROTEIN: NEGATIVE mg/dL
Specific Gravity, Urine: 1.008 (ref 1.005–1.030)
pH: 5 (ref 5.0–8.0)

## 2017-05-21 LAB — BASIC METABOLIC PANEL
Anion gap: 10 (ref 5–15)
BUN: 13 mg/dL (ref 6–20)
CO2: 25 mmol/L (ref 22–32)
Calcium: 9.1 mg/dL (ref 8.9–10.3)
Chloride: 96 mmol/L — ABNORMAL LOW (ref 101–111)
Creatinine, Ser: 1.18 mg/dL — ABNORMAL HIGH (ref 0.44–1.00)
GFR calc Af Amer: 51 mL/min — ABNORMAL LOW (ref >60)
GFR calc non Af Amer: 44 mL/min — ABNORMAL LOW (ref >60)
Glucose, Bld: 112 mg/dL — ABNORMAL HIGH (ref 65–99)
Potassium: 4.4 mmol/L (ref 3.5–5.1)
Sodium: 131 mmol/L — ABNORMAL LOW (ref 135–145)

## 2017-05-21 MED ORDER — ONDANSETRON HCL 4 MG PO TABS: 4.0000 mg | ORAL_TABLET | Freq: Four times a day (QID) | ORAL | Status: DC | PRN

## 2017-05-21 MED ORDER — IMIPRAMINE HCL 25 MG PO TABS
25.0000 mg | ORAL_TABLET | Freq: Every day | ORAL | Status: DC
  Administered 2017-05-21 – 2017-05-23 (×3): 25 mg via ORAL
  Filled 2017-05-21 (×4): qty 1

## 2017-05-21 MED ORDER — CLONAZEPAM 0.5 MG PO TABS: 1.0000 mg | ORAL_TABLET | Freq: Two times a day (BID) | ORAL | Status: DC | PRN

## 2017-05-21 MED ORDER — ZOLPIDEM TARTRATE 5 MG PO TABS
5.0000 mg | ORAL_TABLET | Freq: Every day | ORAL | Status: DC
  Administered 2017-05-21 – 2017-05-23 (×3): 5 mg via ORAL
  Filled 2017-05-21 (×3): qty 1

## 2017-05-21 MED ORDER — OXYCODONE HCL 5 MG PO TABS
5.0000 mg | ORAL_TABLET | Freq: Four times a day (QID) | ORAL | Status: DC | PRN
  Administered 2017-05-23: 09:00:00 5 mg via ORAL
  Filled 2017-05-21: qty 1

## 2017-05-21 MED ORDER — GUAIFENESIN 100 MG/5ML PO SOLN
5.0000 mL | ORAL | Status: DC | PRN
  Administered 2017-05-24: 05:00:00 100 mg via ORAL
  Filled 2017-05-21: qty 5

## 2017-05-21 MED ORDER — TIOTROPIUM BROMIDE MONOHYDRATE 18 MCG IN CAPS
1.0000 | ORAL_CAPSULE | Freq: Two times a day (BID) | RESPIRATORY_TRACT | Status: DC
  Administered 2017-05-22 – 2017-05-23 (×3): 18 ug via RESPIRATORY_TRACT
  Filled 2017-05-21: qty 5

## 2017-05-21 MED ORDER — SODIUM CHLORIDE 0.9 % IV SOLN
INTRAVENOUS | Status: DC
  Administered 2017-05-21 – 2017-05-23 (×6): via INTRAVENOUS

## 2017-05-21 MED ORDER — KETOROLAC TROMETHAMINE 15 MG/ML IJ SOLN
15.0000 mg | Freq: Four times a day (QID) | INTRAMUSCULAR | Status: DC | PRN
  Administered 2017-05-22: 02:00:00 15 mg via INTRAVENOUS
  Filled 2017-05-21: qty 1

## 2017-05-21 MED ORDER — NITROGLYCERIN 0.4 MG SL SUBL: 0.4000 mg | SUBLINGUAL_TABLET | SUBLINGUAL | Status: DC | PRN

## 2017-05-21 MED ORDER — FENTANYL 12 MCG/HR TD PT72
12.5000 ug | MEDICATED_PATCH | TRANSDERMAL | Status: DC
  Filled 2017-05-21: qty 1

## 2017-05-21 MED ORDER — SENNOSIDES-DOCUSATE SODIUM 8.6-50 MG PO TABS: 1.0000 | ORAL_TABLET | Freq: Every evening | ORAL | Status: DC | PRN

## 2017-05-21 MED ORDER — LORAZEPAM 0.5 MG PO TABS: 0.5000 mg | ORAL_TABLET | Freq: Four times a day (QID) | ORAL | Status: DC | PRN

## 2017-05-21 MED ORDER — SODIUM CHLORIDE 0.9 % IV SOLN
1.0000 g | Freq: Two times a day (BID) | INTRAVENOUS | Status: DC
  Administered 2017-05-21 – 2017-05-23 (×5): 1 g via INTRAVENOUS
  Filled 2017-05-21 (×8): qty 1

## 2017-05-21 MED ORDER — ALBUTEROL SULFATE (2.5 MG/3ML) 0.083% IN NEBU: 2.5000 mg | INHALATION_SOLUTION | RESPIRATORY_TRACT | Status: DC | PRN

## 2017-05-21 MED ORDER — BISACODYL 5 MG PO TBEC: 5.0000 mg | DELAYED_RELEASE_TABLET | Freq: Every day | ORAL | Status: DC | PRN

## 2017-05-21 MED ORDER — ENOXAPARIN SODIUM 40 MG/0.4ML ~~LOC~~ SOLN: 40.0000 mg | SUBCUTANEOUS | Status: DC

## 2017-05-21 MED ORDER — CARVEDILOL 3.125 MG PO TABS
12.5000 mg | ORAL_TABLET | Freq: Two times a day (BID) | ORAL | Status: DC
  Administered 2017-05-22 – 2017-05-23 (×3): 12.5 mg via ORAL
  Filled 2017-05-21 (×3): qty 4

## 2017-05-21 MED ORDER — HYDROCODONE-ACETAMINOPHEN 5-325 MG PO TABS
1.0000 | ORAL_TABLET | ORAL | Status: DC | PRN
  Administered 2017-05-21 – 2017-05-24 (×5): 2 via ORAL
  Filled 2017-05-21 (×4): qty 2
  Filled 2017-05-21: qty 1

## 2017-05-21 MED ORDER — ACETAMINOPHEN 325 MG PO TABS: 650.0000 mg | ORAL_TABLET | Freq: Four times a day (QID) | ORAL | Status: DC | PRN

## 2017-05-21 MED ORDER — HYDROCODONE-ACETAMINOPHEN 5-325 MG PO TABS
1.0000 | ORAL_TABLET | Freq: Two times a day (BID) | ORAL | Status: DC
  Administered 2017-05-23 (×2): 1 via ORAL
  Filled 2017-05-21 (×3): qty 1

## 2017-05-21 MED ORDER — ONDANSETRON HCL 4 MG/2ML IJ SOLN
4.0000 mg | Freq: Four times a day (QID) | INTRAMUSCULAR | Status: DC | PRN
  Administered 2017-05-21 – 2017-05-23 (×2): 4 mg via INTRAVENOUS
  Filled 2017-05-21 (×2): qty 2

## 2017-05-21 MED ORDER — ACETAMINOPHEN 650 MG RE SUPP: 650.0000 mg | Freq: Four times a day (QID) | RECTAL | Status: DC | PRN

## 2017-05-21 MED ORDER — GABAPENTIN 600 MG PO TABS
600.0000 mg | ORAL_TABLET | Freq: Three times a day (TID) | ORAL | Status: DC
  Administered 2017-05-21 – 2017-05-23 (×5): 600 mg via ORAL
  Filled 2017-05-21 (×5): qty 1

## 2017-05-21 NOTE — Progress Notes (Signed)
[facility] day:  05/21/2017    Chief Complaint: [name] [name] is a 77 y.o. female with presumed metastatic squamous cell carcinoma of the left lower lobe who is seen for reassessment on day 14 of cycle #3 carboplatin, Abraxane, and pembrolizumab.  HPI: The patient was last seen in the medical oncology clinic on 05/15/2017.  At that time, she was fatigued. She had back pain due to a T7 wedge fracture and right sided rib pain (unclear etiology).  Pain was poorly controlled with oxycodone 3x/day.  Shortness of breath was stable.  Cough had changed (brown, yellow, green).  She was at risk for a post-obstructive pneumonia.  She had no fever.  She felt like she has another UTI.  Urine culture the week prior grew multiple species.  She had not seen urology.  She had hyponatremia (Na 128).  She received day 8 Abraxane.  She began Levaquin.  Sputum and urine cultures were obtained.  She began a Fentanyl patch 12 mcg/hr.  She was instructed to take oxycodone 5 mg q 3-4 hours prn and keep a pain diary for Fentanyl dose adjustment.  She was contacted regarding her urine culture (E coli ESBL) and sputum culture (Corynebacterium striatum).  ID was contacted and she was referred for consultation.  She did not go to her appointment.  Thoracic spine MRI on 05/15/2017 revealed severe benign-appearing subacute compression fracture of T7 without neural impingement.  There was acute new compression fracture of the superior endplate of T8.  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  Right rib films on 05/15/2017 revealed no right rib metastatic lesions or fracture.  There was a metastatic appearing lesion at T7 and questionable metastatic lesion at T8.  During the interim, patient notes that she just feels "so bad". Patient continues to have urinary symptoms and productive cough. Sputum is white in  color at this point. She is experiencing significant pain despite Fentanyl 12 mcg patches and Roxicodone 5 mg q4 PRN. Patient states, "Those patches are worthless. They wouldn't do anything even for a cut finger I don't believe".  She is chronically short of breath. Patient continues on oral levofloxacin, and has 3 days remaining in the prescribed course. SPO2 93% on RA.   Patient is eating well. Her weight remains stable. Pain is rated 10/10 in clinic today.    Past Medical History:  Diagnosis Date  . Asthma   . Breast cancer ([place]) 2009   left  . Cancer ([place])   . CHF (congestive heart failure) ([place])   . CHF (congestive heart failure) ([place])   . Chronic UTI   . COPD (chronic obstructive pulmonary disease) ([place])   . Dizziness   . Fibromyalgia   . Hypertension   . Neuropathy   . Personal history of tobacco use, presenting hazards to health 05/17/2015  . Polyp, larynx   . RA (rheumatoid arthritis) ([place])   . Sinus infection    recent  . Stumbling gait    to the left  . Supplemental oxygen dependent    2.5l    Past Surgical History:  Procedure Laterality Date  . ABDOMINAL HYSTERECTOMY    . BREAST LUMPECTOMY Left 2009   chemo and radiation  . CYST EXCISION Left 02/27/2015   Procedure: CYST REMOVAL;  Surgeon: Hessie Knows, MD;  Location: [facility] ORS;  Service: Orthopedics;  Laterality: Left;  . EYE MUSCLE  SURGERY Right    13 surgeries  . FLEXIBLE BRONCHOSCOPY N/A 07/01/2016   Procedure: FLEXIBLE BRONCHOSCOPY;  Surgeon: Wilhelmina Mcardle, MD;  Location: [facility] ORS;  Service: Pulmonary;  Laterality: N/A;  . FLEXIBLE BRONCHOSCOPY N/A 02/09/2017   Procedure: FLEXIBLE BRONCHOSCOPY;  Surgeon: [name], MD;  Location: [facility] ORS;  Service: Pulmonary;  Laterality: N/A;  . PORTA CATH INSERTION N/A 03/04/2017   Procedure: PORTA CATH INSERTION;  Surgeon: [name], MD;  Location: [facility] [place] [facility];  Service: Cardiovascular;  Laterality: N/A;  . THUMB ARTHROSCOPY Left     Family  History  Problem Relation Age of Onset  . Diabetes Father   . Stroke Father   . Heart attack Father   . CAD Sister     Social History:  reports that she has been smoking cigarettes.  She has a 40.00 pack-year smoking history. She has never used smokeless tobacco. She reports that she does not drink alcohol or use drugs.  He lives with her son and grandson. The patient is alone today.   Allergies:  Allergies  Allergen Reactions  . Contrast Media [Iodinated Diagnostic Agents] Other (See Comments)    Pt was sent to the ED following contrast media injection at [place]. Unknown reason. She has been premedicated since without complications. Pt to be premedicated prior to contrast media injections  . Ioxaglate Other (See Comments)    Pt was sent to the ED following contrast media injection at [place]. Unknown reason. She has been premedicated since without complications. Pt to be premedicated prior to contrast media injections  . Sulfa Antibiotics Other (See Comments)    Unknown  . Tetracycline Hives  . White Petrolatum Other (See Comments)    Blisters  . Amoxicillin-Pot Clavulanate Rash and Other (See Comments)    Blisters in mouth Has patient had a PCN reaction causing immediate rash, facial/tongue/throat swelling, SOB or lightheadedness with hypotension: No Has patient had a PCN reaction causing severe rash involving mucus membranes or skin necrosis: No Has patient had a PCN reaction that required hospitalization: No Has patient had a PCN reaction occurring within the last 10 years: No If all of the above answers are "NO", then may proceed with Cephalosporin use.   . Tape Rash    Current Medications: Current Outpatient Medications  Medication Sig Dispense Refill  . albuterol (PROVENTIL HFA;VENTOLIN HFA) 108 (90 Base) MCG/ACT inhaler Inhale 1-2 puffs into the lungs every 6 (six) hours as needed for wheezing or shortness of breath. 1 Inhaler 2  . Calcium-Vitamin D  600-200 MG-UNIT tablet Take 1 tablet by mouth daily.     . carvedilol (COREG) 12.5 MG tablet Take 1 tablet (12.5 mg total) by mouth 2 (two) times daily with a meal. 60 tablet 0  . cholecalciferol (VITAMIN D) 1000 units tablet Take 1,000 Units by mouth daily.    . clonazePAM (KLONOPIN) 1 MG tablet Take 1 mg by mouth 2 (two) times daily as needed for anxiety.     [name] dexamethasone (DECADRON) 4 MG tablet Take 2 tablets by mouth once a day on the day after chemotherapy and then take 2 tablets two times a day for 2 days. Take with food. 30 tablet 1  . fentaNYL (DURAGESIC - DOSED MCG/HR) 12 MCG/HR Place 1 patch (12.5 mcg total) onto the skin every 3 (three) days for 5 doses. 10 patch 0  . gabapentin (NEURONTIN) 600 MG tablet Take 600 mg by mouth 3 (three) times daily.     [name]  imipramine (TOFRANIL) 25 MG tablet Take 25 mg by mouth at bedtime.     [name] levofloxacin (LEVAQUIN) 500 MG tablet Take 1 tablet (500 mg total) by mouth daily. 10 tablet 0  . lidocaine (LIDODERM) 5 % Place 1 patch onto the skin daily. Remove & Discard patch within 12 hours or as directed by MD 30 patch 0  . lidocaine-prilocaine (EMLA) cream Apply 1 application topically as needed. 30 g 3  . LORazepam (ATIVAN) 0.5 MG tablet Take 1 tablet (0.5 mg total) by mouth every 6 (six) hours as needed (Nausea or vomiting). 30 tablet 0  . Multiple Vitamin (MULTIVITAMIN) capsule Take 1 capsule by mouth daily.    . nitroGLYCERIN (NITROSTAT) 0.4 MG SL tablet Place 0.4 mg under the tongue every 5 (five) minutes as needed for chest pain.     [name] ondansetron (ZOFRAN) 8 MG tablet Take 1 tablet (8 mg total) by mouth 2 (two) times daily as needed. Start on the third day after chemotherapy. 30 tablet 1  . oxyCODONE (OXY IR/ROXICODONE) 5 MG immediate release tablet Take 1 tablet (5 mg total) by mouth every 6 (six) hours as needed for severe pain. 30 tablet 0  . Tiotropium Bromide Monohydrate (SPIRIVA RESPIMAT) 2.5 MCG/ACT AERS Inhale 2.5 mcg into the lungs 2 (two)  times daily. 1 Inhaler 0  . traMADol (ULTRAM) 50 MG tablet Take 100 mg by mouth daily.     . vitamin C (ASCORBIC ACID) 500 MG tablet Take 500 mg by mouth daily.    [name] ZINC SULFATE PO Take 1 tablet by mouth daily.     [name] zolpidem (AMBIEN) 5 MG tablet Take 5 mg by mouth at bedtime.    [name] HYDROcodone-acetaminophen (NORCO/VICODIN) 5-325 MG tablet Take 1 tablet by mouth 2 (two) times daily.      No current facility-administered medications for this visit.    Review of Systems  Constitutional: Positive for malaise/fatigue. Negative for diaphoresis, fever and weight loss.  HENT: Negative.   Eyes: Negative.   Respiratory: Positive for cough (Sputum culture (+) for Corynebacterium striatum), sputum production (white) and shortness of breath (chronic; has lung cancer and COPD). Negative for hemoptysis.   Cardiovascular: Positive for orthopnea. Negative for chest pain, palpitations, leg swelling and PND.       TACHYcardic to the 110s  Gastrointestinal: Negative for abdominal pain, blood in stool, constipation, diarrhea, melena, nausea and vomiting.  Genitourinary: Positive for frequency and urgency. Negative for dysuria and hematuria.       Known UTI (pathology (+) for recurrent ESBL producing E.coli)  Musculoskeletal: Positive for back pain (Significant pain to back and RIGHT ribs r/t known wedge fracture at T7-T8). Negative for falls, joint pain and myalgias.  Skin: Negative for itching and rash.  Neurological: Positive for weakness. Negative for dizziness, tremors and headaches.  Endo/Heme/Allergies: Does not bruise/bleed easily.  Psychiatric/Behavioral: Negative for depression, memory loss and suicidal ideas. The patient is not nervous/anxious and does not have insomnia.   All other systems reviewed and are negative.  Physical Exam: Blood pressure 100/62, pulse (!) 113, temperature 98.7 F (37.1 C), temperature source Tympanic, resp. rate 18, weight 112 lb 4.8 oz (50.9 kg), SpO2 93 %.  GENERAL:   Thin woman sitting in a wheelchair in the exam room complaining of rib pain. MENTAL STATUS:  Alert and oriented to person, place and time. HEAD:  Wearing a pink cap.  Alopecia totalis.  Normocephalic, atraumatic, face symmetric, no Cushingoid features. EYES:  Blue eyes.  Pupils  equal round and reactive to light and accomodation.  No conjunctivitis or scleral icterus. ENT:  Oropharynx clear without lesion.  Tongue normal. Mucous membranes moist.  RESPIRATORY:  Decreased breath sounds left lower lobe.  Upper airway sounds.  Coarse breath sounds right side.  No wheezes. CARDIOVASCULAR:  Regular rate and rhythm without murmur, rub or gallop. ABDOMEN:  Soft, non-tender, with active bowel sounds, and no hepatosplenomegaly.  No masses. BACK/RIBS: Tender to palpation mid thoracic region and right lateral ribs. SKIN:  No rashes, ulcers or lesions. EXTREMITIES: Trace edema.  No skin discoloration or tenderness.  No palpable cords. LYMPH NODES: No palpable cervical, supraclavicular, axillary or inguinal adenopathy  NEUROLOGICAL: Unremarkable. PSYCH:  Appropriate.    Appointment on 05/21/2017  Component Date Value Ref Range Status  . Sodium 05/21/2017 131* 135 - 145 mmol/L Final  . Potassium 05/21/2017 4.4  3.5 - 5.1 mmol/L Final  . Chloride 05/21/2017 96* 101 - 111 mmol/L Final  . CO2 05/21/2017 25  22 - 32 mmol/L Final  . Glucose, Bld 05/21/2017 112* 65 - 99 mg/dL Final  . BUN 05/21/2017 13  6 - 20 mg/dL Final  . Creatinine, Ser 05/21/2017 1.18* 0.44 - 1.00 mg/dL Final  . Calcium 05/21/2017 9.1  8.9 - 10.3 mg/dL Final  . GFR calc non Af Amer 05/21/2017 44* >60 mL/min Final  . GFR calc Af Amer 05/21/2017 51* >60 mL/min Final   Comment: (NOTE) The eGFR has been calculated using the CKD EPI equation. This calculation has not been validated in all clinical situations. eGFR's persistently <60 mL/min signify possible Chronic Kidney Disease.   Georgiann Hahn gap 05/21/2017 10  5 - 15 Final   Performed at  [facility], [address] [place], [place] [address]  . WBC 05/21/2017 1.9* 3.6 - 11.0 K/uL Final  . RBC 05/21/2017 3.32* 3.80 - 5.20 MIL/uL Final  . Hemoglobin 05/21/2017 10.5* 12.0 - 16.0 g/dL Final  . HCT 05/21/2017 30.5* 35.0 - 47.0 % Final  . MCV 05/21/2017 91.9  80.0 - 100.0 fL Final  . MCH 05/21/2017 31.6  26.0 - 34.0 pg Final  . MCHC 05/21/2017 34.4  32.0 - 36.0 g/dL Final  . RDW 05/21/2017 15.9* 11.5 - 14.5 % Final  . Platelets 05/21/2017 145* 150 - 440 K/uL Final  . Neutrophils Relative % 05/21/2017 54  % Final  . Neutro Abs 05/21/2017 1.0* 1.4 - 6.5 K/uL Final  . Lymphocytes Relative 05/21/2017 30  % Final  . Lymphs Abs 05/21/2017 0.6* 1.0 - 3.6 K/uL Final  . Monocytes Relative 05/21/2017 14  % Final  . Monocytes Absolute 05/21/2017 0.3  0.2 - 0.9 K/uL Final  . Eosinophils Relative 05/21/2017 1  % Final  . Eosinophils Absolute 05/21/2017 0.0  0 - 0.7 K/uL Final  . Basophils Relative 05/21/2017 1  % Final  . Basophils Absolute 05/21/2017 0.0  0 - 0.1 K/uL Final   Performed at [facility], [address] [place],  [address]    Assessment:  [name] [name] is a 77 y.o. female with presumed metastatic squamous cell lung cancer.  She presented with clinical [id number] squamous cell lung cancer of the left lung s/p bronchoscopy and biopsy on 07/01/2016.  She has a 40 pack year smoking history.  She presented with left lower chest wall pain.  Foundation One testing on 03/13/2017 revealed no reportable alterations in EGFR, RET, ALK, MET, ERBB2, BRAF, and ROS1.    Tumor was MS-stable.  TMB was 3  Muts/Mb.  There was CDKN2A loss, CDKN2B loss, KRAS G12V, MTAP loss, and TP53 R282W.  PD-L1 was 5%.  Chest CT with contrast on 06/11/2016 revealed a 3.2 x 3.0 mass like area of focal opacity in the medial left lower lobe with obliteration of segmental airways to the anterior left lower lobe.  PET scan on 06/27/2016 revealed a 4.3 x 3.2 cm central left lower lobe lung  lesion (SUV 14.3) consistent with primary bronchogenic carcinoma.  There was equivocal nodal tissue in the subcarinal station demonstrating mild hypermetabolism (SUV 3.6). There was more peripheral left lower lobe increased atelectasis with mucoid impaction which is likely secondary to endobronchial obstruction.  There was a small left pleural effusion.  Head MRI on 07/07/2016 revealed no evidence of metastatic disease.  She received radiation from 07/22/2016 - 09/19/2016.  She received 5 weeks of concurrent carboplatin and Taxol (07/24/2016 - 08/11/2016; 09/05/2016 - 09/16/2016). She requires a reduced dose of Benadryl (25 mg) for her premedication.  Week #3 was postponed secondary to chest pain and evaluation.  She missed a couple of weeks.  Chest CT on 11/21/2016 revealed progressive collapse of the LEFT lower lobe presumably related to central obstructing mass.  Mass obstructed the LEFT lower lobe bronchus, although mass was not well demonstrated.  There were small effusions (no change).  There was centrilobular emphysema unchanged.  There was no evidence pneumonia.  PET scan on 01/27/2017 revealed interval response to therapy.  The previously noted hypermetabolism associated with left lower lobe perihilar lung mass has resolved in the interval.  There was a moderate left pleural effusion that had increased in volume from previous PET-CT and there was now complete atelectasis/consolidation of the left lower lobe, which may obscure residual mass.  There was no new sites of hypermetabolism or evidence of distant metastatic disease.  Bronchoscopy on 02/09/2017 revealed no evidence of endobronchial tumor.  There was stenosis at the entry to the left lower posterior segments which could not be entered.  There was mildly erythematous mucosa with copious secretions in the left lower lobe.  Brushings were positive for non-small cell carcinoma, favor squamous cell carcinoma.  Ultrasound guided thoracentesis on  02/17/2017 revealed was negative for malignancy.  CEA was 7.9 on 04/09/2017.  Bone scan on 02/27/2017 revealed a subtle focus of uptake in the left 7th rib, indeterminate.  She is day 14 s/p cycle #3 carboplatin, Abraxane and pembrolizumab (03/19/2017 - 05/08/2017).  Cycle #2 was truncated secondary to pneumonia.  She has a normocytic anemia likely due to chemotherapy.  Work-up on 07/24/2016 revealed the following normal labs: ferritin (103), B12 (345), folate(19.7).  Iron saturation was 8% and TIBC was 258.  She has B12 deficiency.  B12 was 254 on 04/16/2017 and MMA 789 (high) on 04/23/2017.  She began weekly B12 on 04/30/2016 (last 05/08/2017).  She has a history of stage IA left breast cancer in 02/2005.  She underwent lumpectomy.  Pathology revealed a T1cN0 lesion.  Tumor was ER + , PR +, and Her2/neu -.  Two sentinel lymph nodes were negative.  She received chemotherapy (4 cycles of AC and possibly an abbreviated course of Taxol- no records available).  She received radiation.  She completed Femara in 08/2010.  She has fibromyalgia.  She has advanced thoracoabdominal aortic atherosclerosis with fusiform dilatation of the descending thoracic aorta up to 4.3 cm.  She is followed by Dr. Lucky Cowboy.  She has a history of recurrent UTIs.  UA on 03/19/2017 revealed  >100,000 CFU/mL of an  ESBL producing E. coli.  She was treated with fosfomycin 3 g x 1 dose.  Urine culture on 05/15/2017 revealed ESBL producing E coli.  Chest CT angiogram on 04/30/2017 revealed no pulmonary embolism.  There was a new wedge compression fracture of T7.  She has chronic atelectasis of the left lower lobe and left sided effusion.  Thoracic spine MRI on 05/15/2017 revealed severe benign-appearing subacute compression fracture of T7 without neural impingement.  There was acute new compression fracture of the superior endplate of T8.  There was an aneurysm of the descending thoracic aorta just above the diagphragm with an adjacent  small left effusion.  Sputum culture on 05/15/2017 revealed Corynebacterium striatum.  Symptomatically, patient is fatigued. She has significant pain to her back and RIGHT ribs. Known T7-T8 wedge fracture. No known rib lesion.  Pain is  is poorly controlled with oxycodone 5 mg every 4 hours (6 pills/day) and Fentanyl 12 mcg patch.  She is chronically SOB related to her lung cancer and COPD; sats 93% on RA. Patient is on day 7 of a 10 day course of levofloxacin. Productive cough continues. Sputum has improved from light yellow/green to white. She is at risk for a post-obstructive pneumonia.  She has no fevers. She is TACHYcardic to the 110s.   Urinary symptoms persist. She has an untreated ESBL producing E coli.  She has hyponatremia (Na 131).  WBC is 1900 with an [place] of 1000.  Plan: 1.  Labs today:  CBC with diff, BMP.  2.  Review interval thoracic spine MRI. Discuss compression fracture of T7 and new endplate compression fracture of T8.  3.  Review interval rib films.  No explanation for severe right rib pain.  Discuss repeat bone scan to r/o metastatic disease to ribs.   4.  Discuss untreated UTI with ESBL producing E.coli.  Sensitivities from 05/03 (gent, imipenem, pip/tazo).  Patient previously received fosfomycin.  Patient allergic to Augmentin, tetracycline, and Sulfa.  Discuss plan for consult ID during admission. Will require contact isolation room.   5.  Discuss sputum culture with Corynebacterium striatum. Needs repeat CXR. Consult ID during admission. Spoke with [place] infection prevention nurse, Hubert Azure, and was advised that patient will only require standard precautions for this organism. No droplet or airborne precautions needed per infection prevention.   6.  Week #3 B12 deferred today due to acute illness. Will reschedule injection for next visit.    7.  Discuss admission. Patient is worsening at home. She is at risk for post-obstructive pneumonia and urosepsis with her current  pathogenic infections. Counts are low.  Place on neutropenic precautions.  Will admit for intractable pain.  Pain medications to be titrated during admission. Patient will need to be seen by orthopedics (Dr Rudene Christians), ID (Dr Ola Spurr), and pulmonary medicine (Dr Ashby Dawes).  Hospitalist notified. Dr. Bridgett Larsson accepted patient for inpatient admission to medicine/oncology.   8.  RTC after discharge from hospital. We will call her with a hospital follow up appointment.     Honor Loh, NP  05/21/2017, 3:20 PM    I saw and evaluated the patient, participating in the key portions of the service and reviewing pertinent diagnostic studies and records.  I reviewed the nurse practitioner's note and agree with the findings and the plan.  The assessment and plan were discussed with the patient.  Multiple questions were asked by the patient and answered.   Nolon Stalls, MD 05/21/2017,3:20 PM

## 2017-05-21 NOTE — Progress Notes (Signed)
Advanced Care Plan.  Purpose of Encounter: CODE STATUS. Parties in Attendance: The patient and me. Patient's Decisional Capacity: Yes. Medical Story: [name] [name]  is a 77 y.o. female with a known history of chronic respiratory failure on home oxygen 3 L, COPD, lung cancer on chemotherapy, breast cancer, hypertension, CHF, neuropathy and RA.    She was sent from cancer center for direct admission due to recurrent UTI ESBL and possible postobstructive pneumonia.  I discussed the patient condition, poor prognosis and CODE STATUS.  The patient says she wants full code, because her son was upset about DNR. Plan:  Code Status: Full code for now. Time spent discussing advance care planning: 18 minutes.

## 2017-05-21 NOTE — Progress Notes (Signed)
Patient here for follow up. Patient complaining of pain to Right ribs, states fentanyl patch is not working. Increased pain with coughing.

## 2017-05-21 NOTE — Telephone Encounter (Signed)
Admit to [facility]  Per 05/21/17 los. RTC after admission. We will call patient with a hospital follow up appt.

## 2017-05-21 NOTE — Progress Notes (Deleted)
* [facility]   Synopsis: Patient was diagnosed with squamous cell lung cancer on 07/01/16 by bronchoscopy.  She [id number] sqamous cell lung cancer  status post chemotherapy radiation with persistence seen on bronch on 02/09/17   Assessment and Plan:  Left lower lobe atelectasis secondary to lung cancer. - Currently the patient appears asymptomatic, her LLL atelectasis appears unchanged from previous CT in November 2018.  --Continue chemo per Oncology  COPD, with chronic respiratory failure, dyspnea on exertion. - Continue Spiriva, added rescue inhaler.  --Asked to use mucinex-D, and daily azithromycin 250 mg daily.   Excessive daytime sleepiness.  --May be due to sleep apnea vs. Klonopin/ambien combo.  --Cut down on one of the above meds.   Nicotine Abuse.  --this is also likely contributing to her dyspnea.  --Discussed the importance of smoking cessation, spent 3 min in discussion.   No orders of the defined types were placed in this encounter.  No follow-ups on file.  Date: 05/21/2017  MRN# [id number] [name] [name] 1940-09-01   [name] [name] is a 77 y.o. old female seen in follow up for chief complaint of  No chief complaint on file.   Subjective:  The patient is back today as follow up, her most recent PET scan shows persistent LLL atelectasis, however there is no residual uptake. She underwent re-bronch on 02/09/17 which showed residual cancer in the LLL.   She wears oxygen at 3L at night, she does not wear any during the day.  Her course has been complicated by T7 wedge fracture. She is sleepy during the day, she has been tested for OSA which was negative several years ago.   **Bronch 07/01/16 positive for SqCa.  **Re-Bronch 02/09/17, positive for SqCa. **PET scan 01/27/17 on comparison with CT chest 11/21/16 shows minimally changed LLL atelectasis with small pleural effusion in the left.   Desat walk 12/01/16; Baseline sat on RA at rest; sat 94% and  HR 92;  After walking 360 feet she felt mild dyspnea, sat was 92% and HR 97.   CT chest 11/21/16, and comparison with previous on 06/11/16, there is complete left lower lobe atelectasis secondary to obstruction of the left lower lobe bronchus.  On previous CT chest and on previous bronchoscopy this was partially obstructed, this now appears to be completely obstructed.    Medication:    Current Outpatient Medications:  .  albuterol (PROVENTIL HFA;VENTOLIN HFA) 108 (90 Base) MCG/ACT inhaler, Inhale 1-2 puffs into the lungs every 6 (six) hours as needed for wheezing or shortness of breath., Disp: 1 Inhaler, Rfl: 2 .  Calcium-Vitamin D 600-200 MG-UNIT tablet, Take 1 tablet by mouth daily. , Disp: , Rfl:  .  carvedilol (COREG) 12.5 MG tablet, Take 1 tablet (12.5 mg total) by mouth 2 (two) times daily with a meal., Disp: 60 tablet, Rfl: 0 .  cholecalciferol (VITAMIN D) 1000 units tablet, Take 1,000 Units by mouth daily., Disp: , Rfl:  .  clonazePAM (KLONOPIN) 1 MG tablet, Take 1 mg by mouth 2 (two) times daily as needed for anxiety. , Disp: , Rfl:  .  dexamethasone (DECADRON) 4 MG tablet, Take 2 tablets by mouth once a day on the day after chemotherapy and then take 2 tablets two times a day for 2 days. Take with food., Disp: 30 tablet, Rfl: 1 .  fentaNYL (DURAGESIC - DOSED MCG/HR) 12 MCG/HR, Place 1 patch (12.5 mcg total) onto the skin every 3 (three) days for 5 doses., Disp: 10  patch, Rfl: 0 .  gabapentin (NEURONTIN) 600 MG tablet, Take 600 mg by mouth 3 (three) times daily. , Disp: , Rfl:  .  HYDROcodone-acetaminophen (NORCO/VICODIN) 5-325 MG tablet, Take 1 tablet by mouth 2 (two) times daily. , Disp: , Rfl:  .  imipramine (TOFRANIL) 25 MG tablet, Take 25 mg by mouth at bedtime. , Disp: , Rfl:  .  levofloxacin (LEVAQUIN) 500 MG tablet, Take 1 tablet (500 mg total) by mouth daily., Disp: 10 tablet, Rfl: 0 .  lidocaine (LIDODERM) 5 %, Place 1 patch onto the skin daily. Remove & Discard patch within 12  hours or as directed by MD, Disp: 30 patch, Rfl: 0 .  lidocaine-prilocaine (EMLA) cream, Apply 1 application topically as needed., Disp: 30 g, Rfl: 3 .  LORazepam (ATIVAN) 0.5 MG tablet, Take 1 tablet (0.5 mg total) by mouth every 6 (six) hours as needed (Nausea or vomiting)., Disp: 30 tablet, Rfl: 0 .  Multiple Vitamin (MULTIVITAMIN) capsule, Take 1 capsule by mouth daily., Disp: , Rfl:  .  nitroGLYCERIN (NITROSTAT) 0.4 MG SL tablet, Place 0.4 mg under the tongue every 5 (five) minutes as needed for chest pain. , Disp: , Rfl:  .  ondansetron (ZOFRAN) 8 MG tablet, Take 1 tablet (8 mg total) by mouth 2 (two) times daily as needed. Start on the third day after chemotherapy., Disp: 30 tablet, Rfl: 1 .  oxyCODONE (OXY IR/ROXICODONE) 5 MG immediate release tablet, Take 1 tablet (5 mg total) by mouth every 6 (six) hours as needed for severe pain., Disp: 30 tablet, Rfl: 0 .  Tiotropium Bromide Monohydrate (SPIRIVA RESPIMAT) 2.5 MCG/ACT AERS, Inhale 2.5 mcg into the lungs 2 (two) times daily., Disp: 1 Inhaler, Rfl: 0 .  traMADol (ULTRAM) 50 MG tablet, Take 100 mg by mouth daily. , Disp: , Rfl:  .  vitamin C (ASCORBIC ACID) 500 MG tablet, Take 500 mg by mouth daily., Disp: , Rfl:  .  ZINC SULFATE PO, Take 1 tablet by mouth daily. , Disp: , Rfl:  .  zolpidem (AMBIEN) 5 MG tablet, Take 5 mg by mouth at bedtime., Disp: , Rfl:   Allergies:  Contrast media [iodinated diagnostic agents]; Ioxaglate; Sulfa antibiotics; Tetracycline; White petrolatum; Amoxicillin-pot clavulanate; and Tape  Review of Systems: Gen:  Denies  fever, sweats. HEENT: Denies blurred vision. Cvc:  No dizziness, chest pain or heaviness Resp:   Denies cough or sputum porduction. Gi: Denies swallowing difficulty, stomach pain. constipation, bowel incontinence Gu:  Denies bladder incontinence, burning urine Ext:   No Joint pain, stiffness. Skin: No skin rash, easy bruising. Endoc:  No polyuria, polydipsia. Psych: No depression,  insomnia. Other:  All other systems were reviewed and found to be negative other than what is mentioned in the HPI.   Physical Examination:   VS: There were no vitals taken for this visit.  General Appearance: No distress  Neuro:without focal findings,  speech normal,  HEENT: PERRLA, EOM intact. Pulmonary: Continued Decreased air entry left base. CardiovascularNormal S1,S2.  No m/r/g.   Abdomen: Benign, Soft, non-tender. Renal:  No costovertebral tenderness  GU:  Not performed at this time. Endoc: No evident thyromegaly, no signs of acromegaly. Skin:   warm, no rash. Extremities: normal, no cyanosis, clubbing.   LABORATORY PANEL:   CBC Recent Labs  Lab 05/15/17 0803  WBC 3.9  HGB 11.0*  HCT 31.6*  PLT 154   ------------------------------------------------------------------------------------------------------------------  Chemistries  Recent Labs  Lab 05/15/17 0803  NA 128*  K 4.3  CL  91*  CO2 27  GLUCOSE 115*  BUN 16  CREATININE 0.96  CALCIUM 8.8*  AST 19  ALT 14  ALKPHOS 109  BILITOT 0.7   ------------------------------------------------------------------------------------------------------------------  Cardiac Enzymes No results for input(s): TROPONINI in the last 168 hours. ------------------------------------------------------------  RADIOLOGY:   No results found for this or any previous visit. Results for orders placed during the hospital encounter of 10/28/16  DG Chest 2 View   Narrative CLINICAL DATA:  Off and chest congestion for the past 2 weeks with fever. History of COPD -asthma, current smoker, CHF.  EXAM: CHEST  2 VIEW  COMPARISON:  Chest x-ray of August 07, 2016  FINDINGS: The right lung is mildly hyperinflated. There is a tiny right pleural effusion. On the left there is a small pleural effusion. There is left basilar atelectasis or infiltrate. The heart and pulmonary vascularity are normal. There is calcification in the wall of  the aortic arch. There is mild multilevel degenerative disc disease of the thoracic spine.  IMPRESSION: Left lower lobe atelectasis or pneumonia with small left pleural effusion. Tiny right pleural effusion. Underlying COPD. No overt CHF. Followup PA and lateral chest X-ray is recommended in 3-4 weeks following trial of antibiotic therapy to ensure resolution and exclude underlying malignancy.  Thoracic aortic atherosclerosis.   Electronically Signed   By: David  Martinique M.D.   On: 10/28/2016 12:18    ------------------------------------------------------------------------------------------------------------------  Thank  you for allowing [facility] to assist in the care of your patient. Our recommendations are noted above.  Please contact us if we can be of further service.   [name], MD.  [facility] Office Number: [phone]  Patricia Pesa, M.D.  Merton Border, M.D  05/21/2017

## 2017-05-21 NOTE — H&P (Signed)
[facility] NAME: Tyeesha Riker    MR#:  [id number]  DATE OF BIRTH:  January 04, 1941  DATE OF ADMISSION:  05/21/2017  PRIMARY CARE PHYSICIAN: Tracie Harrier, MD   REQUESTING/REFERRING PHYSICIAN: Dr. Olene Craven  CHIEF COMPLAINT:  No chief complaint on file.  ESBL UTI and the possible obstructive pneumonia. HISTORY OF PRESENT ILLNESS:  Luiza Carranco  is a 77 y.o. female with a known history of chronic respiratory failure on home oxygen 3 L, COPD, lung cancer on chemotherapy, breast cancer, hypertension, CHF, neuropathy and RA.  The patient was sent from cancer center by Dr. Susy Manor for direct admission due to above chief complaints.  The patient has been treated with recurrent UTI.  She developed a UTI several days ago and has been treated with Levaquin for 7 days.  But the urine culture showed ESBL UTI.  The patient also complains of cough with sputum for several days.  She has generalized weakness and poor oral intake.  She also complains of back pain.  MRI of thoracic spine days ago showed T7 compression fracture without neural impingement. Right rib films on 05/15/2017 revealed no right rib metastatic lesions or fracture.  There was a metastatic appearing lesion at T7 and questionable metastatic lesion at T8.  PAST MEDICAL HISTORY:   Past Medical History:  Diagnosis Date  . Asthma   . Breast cancer ([place]) 2009   left  . Cancer ([place])   . CHF (congestive heart failure) ([place])   . CHF (congestive heart failure) ([place])   . Chronic UTI   . COPD (chronic obstructive pulmonary disease) ([place])   . Dizziness   . Fibromyalgia   . Hypertension   . Neuropathy   . Personal history of tobacco use, presenting hazards to health 05/17/2015  . Polyp, larynx   . RA (rheumatoid arthritis) ([place])   . Sinus infection    recent  . Stumbling gait    to the left  . Supplemental oxygen dependent    2.5l    PAST SURGICAL HISTORY:   Past Surgical History:    Procedure Laterality Date  . ABDOMINAL HYSTERECTOMY    . BREAST LUMPECTOMY Left 2009   chemo and radiation  . CYST EXCISION Left 02/27/2015   Procedure: CYST REMOVAL;  Surgeon: Hessie Knows, MD;  Location: [facility] ORS;  Service: Orthopedics;  Laterality: Left;  . EYE MUSCLE SURGERY Right    13 surgeries  . FLEXIBLE BRONCHOSCOPY N/A 07/01/2016   Procedure: FLEXIBLE BRONCHOSCOPY;  Surgeon: Wilhelmina Mcardle, MD;  Location: [facility] ORS;  Service: Pulmonary;  Laterality: N/A;  . FLEXIBLE BRONCHOSCOPY N/A 02/09/2017   Procedure: FLEXIBLE BRONCHOSCOPY;  Surgeon: [name], MD;  Location: [facility] ORS;  Service: Pulmonary;  Laterality: N/A;  . PORTA CATH INSERTION N/A 03/04/2017   Procedure: PORTA CATH INSERTION;  Surgeon: [name], MD;  Location: [facility];  Service: Cardiovascular;  Laterality: N/A;  . THUMB ARTHROSCOPY Left     SOCIAL HISTORY:   Social History   Tobacco Use  . Smoking status: Current Every Day Smoker    Packs/day: 1.00    Years: 40.00    Pack years: 40.00    Types: Cigarettes  . Smokeless tobacco: Never Used  Substance Use Topics  . Alcohol use: No    FAMILY HISTORY:   Family History  Problem Relation Age of Onset  . Diabetes Father   . Stroke Father   . Heart attack Father   .  CAD Sister     DRUG ALLERGIES:   Allergies  Allergen Reactions  . Contrast Media [Iodinated Diagnostic Agents] Other (See Comments)    Pt was sent to the ED following contrast media injection at [place]. Unknown reason. She has been premedicated since without complications. Pt to be premedicated prior to contrast media injections  . Ioxaglate Other (See Comments)    Pt was sent to the ED following contrast media injection at [place]. Unknown reason. She has been premedicated since without complications. Pt to be premedicated prior to contrast media injections  . Sulfa Antibiotics Other (See Comments)    Unknown  . Tetracycline Hives  . White  Petrolatum Other (See Comments)    Blisters  . Amoxicillin-Pot Clavulanate Rash and Other (See Comments)    Blisters in mouth Has patient had a PCN reaction causing immediate rash, facial/tongue/throat swelling, SOB or lightheadedness with hypotension: No Has patient had a PCN reaction causing severe rash involving mucus membranes or skin necrosis: No Has patient had a PCN reaction that required hospitalization: No Has patient had a PCN reaction occurring within the last 10 years: No If all of the above answers are "NO", then may proceed with Cephalosporin use.   . Tape Rash    REVIEW OF SYSTEMS:   Review of Systems  Constitutional: Positive for malaise/fatigue. Negative for chills and fever.  HENT: Negative for sore throat.   Eyes: Negative for blurred vision and double vision.  Respiratory: Positive for cough, sputum production and shortness of breath. Negative for hemoptysis, wheezing and stridor.   Cardiovascular: Negative for chest pain, palpitations, orthopnea and leg swelling.  Gastrointestinal: Negative for abdominal pain, blood in stool, diarrhea, melena, nausea and vomiting.  Genitourinary: Negative for dysuria, flank pain, frequency, hematuria and urgency.  Musculoskeletal: Negative for back pain and joint pain.  Skin: Negative for rash.  Neurological: Negative for dizziness, sensory change, focal weakness, seizures, loss of consciousness, weakness and headaches.  Endo/Heme/Allergies: Negative for polydipsia.  Psychiatric/Behavioral: Negative for depression. The patient is not nervous/anxious.     MEDICATIONS AT HOME:   Prior to Admission medications   Medication Sig Start Date End Date Taking? Authorizing Provider  albuterol (PROVENTIL HFA;VENTOLIN HFA) 108 (90 Base) MCG/ACT inhaler Inhale 1-2 puffs into the lungs every 6 (six) hours as needed for wheezing or shortness of breath. 02/16/17   [name], MD  Calcium-Vitamin D 600-200 MG-UNIT tablet Take 1 tablet  by mouth daily.     [provider]  carvedilol (COREG) 12.5 MG tablet Take 1 tablet (12.5 mg total) by mouth 2 (two) times daily with a meal. 11/29/15   Hugelmeyer, Alexis, DO  cholecalciferol (VITAMIN D) 1000 units tablet Take 1,000 Units by mouth daily.    [provider]  clonazePAM (KLONOPIN) 1 MG tablet Take 1 mg by mouth 2 (two) times daily as needed for anxiety.     [provider]  dexamethasone (DECADRON) 4 MG tablet Take 2 tablets by mouth once a day on the day after chemotherapy and then take 2 tablets two times a day for 2 days. Take with food. 03/19/17   Lequita Asal, MD  fentaNYL (DURAGESIC - DOSED MCG/HR) 12 MCG/HR Place 1 patch (12.5 mcg total) onto the skin every 3 (three) days for 5 doses. 05/15/17 05/28/17  Lequita Asal, MD  gabapentin (NEURONTIN) 600 MG tablet Take 600 mg by mouth 3 (three) times daily.  05/25/12   [provider]  HYDROcodone-acetaminophen (NORCO/VICODIN) 5-325 MG  tablet Take 1 tablet by mouth 2 (two) times daily.  06/22/16   [provider]  imipramine (TOFRANIL) 25 MG tablet Take 25 mg by mouth at bedtime.  09/21/15   [provider]  levofloxacin (LEVAQUIN) 500 MG tablet Take 1 tablet (500 mg total) by mouth daily. 05/15/17   Lequita Asal, MD  lidocaine (LIDODERM) 5 % Place 1 patch onto the skin daily. Remove & Discard patch within 12 hours or as directed by MD 04/27/17   Jacquelin Hawking, NP  lidocaine-prilocaine (EMLA) cream Apply 1 application topically as needed. 03/19/17   Karen Kitchens, NP  LORazepam (ATIVAN) 0.5 MG tablet Take 1 tablet (0.5 mg total) by mouth every 6 (six) hours as needed (Nausea or vomiting). 03/19/17   Lequita Asal, MD  Multiple Vitamin (MULTIVITAMIN) capsule Take 1 capsule by mouth daily.    [provider]  nitroGLYCERIN (NITROSTAT) 0.4 MG SL tablet Place 0.4 mg under the tongue every 5 (five) minutes as needed for chest pain.     [provider]    ondansetron (ZOFRAN) 8 MG tablet Take 1 tablet (8 mg total) by mouth 2 (two) times daily as needed. Start on the third day after chemotherapy. 03/19/17   Lequita Asal, MD  oxyCODONE (OXY IR/ROXICODONE) 5 MG immediate release tablet Take 1 tablet (5 mg total) by mouth every 6 (six) hours as needed for severe pain. 05/08/17   Karen Kitchens, NP  Tiotropium Bromide Monohydrate (SPIRIVA RESPIMAT) 2.5 MCG/ACT AERS Inhale 2.5 mcg into the lungs 2 (two) times daily. 06/26/16   Flora Lipps, MD  traMADol (ULTRAM) 50 MG tablet Take 100 mg by mouth daily.     [provider]  vitamin C (ASCORBIC ACID) 500 MG tablet Take 500 mg by mouth daily.    [provider]  ZINC SULFATE PO Take 1 tablet by mouth daily.     [provider]  zolpidem (AMBIEN) 5 MG tablet Take 5 mg by mouth at bedtime.    [provider]      VITAL SIGNS:  Blood pressure 125/69, pulse (!) 115, temperature 98.5 F (36.9 C), temperature source Oral, resp. rate 18, height _0  (1.575 m), weight 110 lb 10.7 oz (50.2 kg), SpO2 93 %.  PHYSICAL EXAMINATION:  Physical Exam  GENERAL:  77 y.o.-year-old patient lying in the bed with no acute distress.  EYES: Pupils equal, round, reactive to light and accommodation. No scleral icterus. Extraocular muscles intact.  HEENT: Head atraumatic, normocephalic. Oropharynx and nasopharynx clear.  NECK:  Supple, no jugular venous distention. No thyroid enlargement, no tenderness.  LUNGS: No wheezing, but has crackles and rhonchi . No use of accessory muscles of respiration.  Right rib cage tenderness on palpation. CARDIOVASCULAR: S1, S2 normal. No murmurs, rubs, or gallops.  ABDOMEN: Soft, nontender, nondistended. Bowel sounds present. No organomegaly or mass.  EXTREMITIES: No pedal edema, cyanosis, or clubbing.  NEUROLOGIC: Cranial nerves II through XII are intact. Muscle strength 5/5 in all extremities. Sensation intact. Gait not checked.  PSYCHIATRIC: The  patient is alert and oriented x 3.  SKIN: No obvious rash, lesion, or ulcer.   LABORATORY PANEL:   CBC Recent Labs  Lab 05/21/17 1428  WBC 1.9*  HGB 10.5*  HCT 30.5*  PLT 145*   ------------------------------------------------------------------------------------------------------------------  Chemistries  Recent Labs  Lab 05/15/17 0803 05/21/17 1428  NA 128* 131*  K 4.3 4.4  CL 91* 96*  CO2 27 25  GLUCOSE 115*  112*  BUN 16 13  CREATININE 0.96 1.18*  CALCIUM 8.8* 9.1  AST 19  --   ALT 14  --   ALKPHOS 109  --   BILITOT 0.7  --    ------------------------------------------------------------------------------------------------------------------  Cardiac Enzymes No results for input(s): TROPONINI in the last 168 hours. ------------------------------------------------------------------------------------------------------------------  RADIOLOGY:  No results found.    IMPRESSION AND PLAN:   ESBL UTI. The patient is being admitted to the hospital for direct admission. Start meropenem pharmacy to dose.  Follow-up urinalysis urine culture today.  ID consult.  Possible postobstructive pneumonia in the history of lung cancer, on chemotherapy. Continue meropenem, Robitussin as needed, NEB PRN.  Follow-up sputum culture if possible.  Pulmonary consult.  Chronic respiratory failure.  Continue oxygen by nasal cannula 3 L, NEB PRN. COPD.  Stable.  Nebulizer as needed. Neutropenia and thrombocytopenia.  Possible due to chemo.  Follow-up CBC. Hyponatremia.  Looks like chronic.  All the records are reviewed and case discussed with ED provider. Management plans discussed with the patient, family and they are in agreement.  CODE STATUS: Full code  TOTAL TIME TAKING CARE OF THIS PATIENT: 55 minutes.    Demetrios Loll M.D on 05/21/2017 at 7:07 PM  Between 7am to 6pm - Pager - [phone]  After 6pm go to [email or web address] - Proofreader  [facility]  Office  [phone]  CC: Primary care physician; Tracie Harrier, MD   Note: This dictation was prepared with Dragon dictation along with smaller phrase technology. Any transcriptional errors that result from this process are unin

## 2017-05-21 NOTE — Consult Note (Addendum)
Consultation: Asymptomatic bacteriuria  Requested by: Dr. 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  She is also had back pain due to a T7 wedge fracture which she reports will be addressed tomorrow with the procedure.  Urology was consulted because she continues to have ESBL E. coli on urine cultures.  On review of her cultures she has had this bacteria on repeated cultures at least back to Nov 2017 presumably despite repeated antibiotics.  She denies any urinary symptoms, specifically denies fever, bladder pain, dysuria, frequency or urgency.  She can hold her urine for a long time, she voids with a good stream and feels like she gets empty.  She has no urgency.  She has mild stress incontinence.  She denies any urologic surgery.  She underwent a PET scan January 2019 which revealed normal urinary tract without stone or hydronephrosis.  The bladder was not overly distended. I reviewed all the images. Again, on further questioning she denies any urinary symptoms and only knows she has "an infection" because she see's a "color change" in the urine.  She also denies gross hematuria - no brown or red urine or clots.     Past Medical History:  Diagnosis Date  . Asthma   . Breast cancer ([place]) 2009   left  . Cancer ([place])   . CHF (congestive heart failure) ([place])   . CHF (congestive heart failure) ([place])   . Chronic UTI   . COPD (chronic obstructive pulmonary disease) ([place])   . Dizziness   . Fibromyalgia   . Hypertension   . Neuropathy   . Personal history of tobacco use, presenting hazards to health 05/17/2015  . Polyp, larynx   . RA (rheumatoid arthritis) ([place])   . Sinus infection    recent  . Stumbling gait    to the left  . Supplemental oxygen dependent    2.5l   Past Surgical History:  Procedure  Laterality Date  . ABDOMINAL HYSTERECTOMY    . BREAST LUMPECTOMY Left 2009   chemo and radiation  . CYST EXCISION Left 02/27/2015   Procedure: CYST REMOVAL;  Surgeon: Hessie Knows, MD;  Location: [facility] OR[facility];  Service: Orthopedics;  Laterality: Left;  . EYE MUSCLE SURGERY Right    13 surgeries  . FLEXIBLE BRONCHOSCOPY N/A 07/01/2016   Procedure: FLEXIBLE BRONCHOSCOPY;  Surgeon: Wilhelmina Mcardle, MD;  Location: [facility] OR[facility];  Service: Pulmonary;  Laterality: N/A;  . FLEXIBLE BRONCHOSCOPY N/A 02/09/2017   Procedure: FLEXIBLE BRONCHOSCOPY;  Surgeon: [name], MD;  Location: [facility];  Service: Pulmonary;  Laterality: N/A;  . PORTA CATH INSERTION N/A 03/04/2017   Procedure: PORTA CATH INSERTION;  Surgeon: [name], MD;  Location: [facility];  Service: Cardiovascular;  Laterality: N/A;  . THUMB ARTHROSCOPY Left     Home Medications:  Medications Prior to Admission  Medication Sig Dispense Refill Last Dose  . albuterol (PROVENTIL HFA;VENTOLIN HFA) 108 (90 Base) MCG/ACT inhaler Inhale 1-2 puffs into the lungs every 6 (six) hours as needed for wheezing or shortness of breath. 1 Inhaler 2 Taking  . Calcium-Vitamin D 600-200 MG-UNIT tablet Take 1 tablet by mouth daily.    Taking  . carvedilol (COREG) 12.5 MG tablet Take 1 tablet (12.5 mg total) by mouth 2 (two) times daily with a meal. 60 tablet 0 Taking  . cholecalciferol (  VITAMIN D) 1000 units tablet Take 1,000 Units by mouth daily.   Taking  . clonazePAM (KLONOPIN) 1 MG tablet Take 1 mg by mouth 2 (two) times daily as needed for anxiety.    Taking  . dexamethasone (DECADRON) 4 MG tablet Take 2 tablets by mouth once a day on the day after chemotherapy and then take 2 tablets two times a day for 2 days. Take with food. 30 tablet 1 Taking  . fentaNYL (DURAGESIC - DOSED MCG/HR) 12 MCG/HR Place 1 patch (12.5 mcg total) onto the skin every 3 (three) days for 5 doses. 10 patch 0 Taking  . gabapentin (NEURONTIN) 600 MG tablet Take 600 mg  by mouth 3 (three) times daily.    Taking  . HYDROcodone-acetaminophen (NORCO/VICODIN) 5-325 MG tablet Take 1 tablet by mouth 2 (two) times daily.    Not Taking  . imipramine (TOFRANIL) 25 MG tablet Take 25 mg by mouth at bedtime.    Taking  . levofloxacin (LEVAQUIN) 500 MG tablet Take 1 tablet (500 mg total) by mouth daily. 10 tablet 0 Taking  . lidocaine (LIDODERM) 5 % Place 1 patch onto the skin daily. Remove & Discard patch within 12 hours or as directed by MD 30 patch 0 Taking  . lidocaine-prilocaine (EMLA) cream Apply 1 application topically as needed. 30 g 3 Taking  . LORazepam (ATIVAN) 0.5 MG tablet Take 1 tablet (0.5 mg total) by mouth every 6 (six) hours as needed (Nausea or vomiting). 30 tablet 0 Taking  . Multiple Vitamin (MULTIVITAMIN) capsule Take 1 capsule by mouth daily.   Taking  . nitroGLYCERIN (NITROSTAT) 0.4 MG SL tablet Place 0.4 mg under the tongue every 5 (five) minutes as needed for chest pain.    Taking  . ondansetron (ZOFRAN) 8 MG tablet Take 1 tablet (8 mg total) by mouth 2 (two) times daily as needed. Start on the third day after chemotherapy. 30 tablet 1 Taking  . oxyCODONE (OXY IR/ROXICODONE) 5 MG immediate release tablet Take 1 tablet (5 mg total) by mouth every 6 (six) hours as needed for severe pain. 30 tablet 0 Taking  . Tiotropium Bromide Monohydrate (SPIRIVA RESPIMAT) 2.5 MCG/ACT AERS Inhale 2.5 mcg into the lungs 2 (two) times daily. 1 Inhaler 0 Taking  . traMADol (ULTRAM) 50 MG tablet Take 100 mg by mouth daily.    Taking  . vitamin C (ASCORBIC ACID) 500 MG tablet Take 500 mg by mouth daily.   Taking  . ZINC SULFATE PO Take 1 tablet by mouth daily.    Taking  . zolpidem (AMBIEN) 5 MG tablet Take 5 mg by mouth at bedtime.   Taking   Allergies:  Allergies  Allergen Reactions  . Contrast Media [Iodinated Diagnostic Agents] Other (See Comments)    Pt was sent to the ED following contrast media injection at [place]. Unknown reason. She has been  premedicated since without complications. Pt to be premedicated prior to contrast media injections  . Ioxaglate Other (See Comments)    Pt was sent to the ED following contrast media injection at [place]. Unknown reason. She has been premedicated since without complications. Pt to be premedicated prior to contrast media injections  . Sulfa Antibiotics Other (See Comments)    Unknown  . Tetracycline Hives  . White Petrolatum Other (See Comments)    Blisters  . Amoxicillin-Pot Clavulanate Rash and Other (See Comments)    Blisters in mouth Has patient had a PCN reaction causing immediate rash, facial/tongue/throat swelling, SOB  or lightheadedness with hypotension: No Has patient had a PCN reaction causing severe rash involving mucus membranes or skin necrosis: No Has patient had a PCN reaction that required hospitalization: No Has patient had a PCN reaction occurring within the last 10 years: No If all of the above answers are "NO", then may proceed with Cephalosporin use.   . Tape Rash    Family History  Problem Relation Age of Onset  . Diabetes Father   . Stroke Father   . Heart attack Father   . CAD Sister    Social History:  reports that she has been smoking cigarettes.  She has a 40.00 pack-year smoking history. She has never used smokeless tobacco. She reports that she does not drink alcohol or use drugs.  ROS: A complete review of systems was performed.  All systems are negative except for pertinent findings as noted. Review of Systems  Musculoskeletal: Positive for back pain.  All other systems reviewed and are negative.    Physical Exam:  Vital signs in last 24 hours: Temp:  [98 F (36.7 C)-98.7 F (37.1 C)] 98 F (36.7 C) (05/09 1909) Pulse Rate:  [111-115] 113 (05/09 1909) Resp:  [16-18] 16 (05/09 1909) BP: (87-154)/(56-73) 154/73 (05/09 1909) SpO2:  [93 %-100 %] 100 % (05/09 1909) Weight:  [50.2 kg (110 lb 10.7 oz)-50.9 kg (112 lb 4.8 oz)] 50.2 kg  (110 lb 10.7 oz) (05/09 1736) General:  Alert and oriented, No acute distress, watching TV Neck: No JVD or lymphadenopathy Cardiovascular: Regular rate and rhythm Lungs: Regular rate and effort Abdomen: Soft, nontender, nondistended, no abdominal masses Back: No CVA tenderness Extremities: No edema Neurologic: Grossly intact  Laboratory Data:  Results for orders placed or performed in visit on 05/21/17 (from the past 24 hour(s))  Basic metabolic panel     Status: Abnormal   Collection Time: 05/21/17  2:28 PM  Result Value Ref Range   Sodium 131 (L) 135 - 145 mmol/L   Potassium 4.4 3.5 - 5.1 mmol/L   Chloride 96 (L) 101 - 111 mmol/L   CO2 25 22 - 32 mmol/L   Glucose, Bld 112 (H) 65 - 99 mg/dL   BUN 13 6 - 20 mg/dL   Creatinine, Ser 1.18 (H) 0.44 - 1.00 mg/dL   Calcium 9.1 8.9 - 10.3 mg/dL   GFR calc non Af Amer 44 (L) >60 mL/min   GFR calc Af Amer 51 (L) >60 mL/min   Anion gap 10 5 - 15  CBC with Differential     Status: Abnormal   Collection Time: 05/21/17  2:28 PM  Result Value Ref Range   WBC 1.9 (L) 3.6 - 11.0 K/uL   RBC 3.32 (L) 3.80 - 5.20 MIL/uL   Hemoglobin 10.5 (L) 12.0 - 16.0 g/dL   HCT 30.5 (L) 35.0 - 47.0 %   MCV 91.9 80.0 - 100.0 fL   MCH 31.6 26.0 - 34.0 pg   MCHC 34.4 32.0 - 36.0 g/dL   RDW 15.9 (H) 11.5 - 14.5 %   Platelets 145 (L) 150 - 440 K/uL   Neutrophils Relative % 54 %   Neutro Abs 1.0 (L) 1.4 - 6.5 K/uL   Lymphocytes Relative 30 %   Lymphs Abs 0.6 (L) 1.0 - 3.6 K/uL   Monocytes Relative 14 %   Monocytes Absolute 0.3 0.2 - 0.9 K/uL   Eosinophils Relative 1 %   Eosinophils Absolute 0.0 0 - 0.7 K/uL   Basophils Relative 1 %  Basophils Absolute 0.0 0 - 0.1 K/uL   Recent Results (from the past 240 hour(s))  Culture, sputum-assessment     Status: None   Collection Time: 05/15/17 10:53 AM  Result Value Ref Range Status   Specimen Description   Final    SPU Performed at [facility], [address] [place], [place] [address]    Special  Requests   Final    NONE Performed at [facility], [address] [place], [place] [address]    Sputum evaluation   Final    THIS SPECIMEN IS ACCEPTABLE FOR SPUTUM CULTURE Performed at [facility], [address] [place], [place] [address]    Report Status 05/15/2017 FINAL  Final  Culture, respiratory (NON-Expectorated)     Status: None   Collection Time: 05/15/17 10:53 AM  Result Value Ref Range Status   Specimen Description   Final    SPU Performed at [facility], [address] [place], [place] [address]    Special Requests   Final    NONE Reflexed from [id number] Performed at [facility], [address] [place], [place] [address]    Gram Stain   Final    FEW WBC PRESENT, PREDOMINANTLY PMN ABUNDANT GRAM POSITIVE RODS    Culture   Final    ABUNDANT CORYNEBACTERIUM STRIATUM Standardized susceptibility testing for this organism is not available. Performed at [facility], [place] [address] [place], [place] [address]    Report Status 05/17/2017 FINAL  Final  Urine Culture     Status: Abnormal   Collection Time: 05/15/17 10:56 AM  Result Value Ref Range Status   Specimen Description   Final    URINE, CLEAN CATCH Performed at [facility], [address], [place], [place] [address]    Special Requests   Final    NONE Performed at [facility] [address] [place], [place] [address]    Culture (A)  Final    >=100,000 COLONIES/mL ESCHERICHIA COLI Confirmed Extended Spectrum Beta-Lactamase Producer (ESBL).  In bloodstream infections from ESBL organisms, carbapenems are preferred over piperacillin/tazobactam. They are shown to have a lower risk of mortality. Performed at [facility], [place] [address], [place],  [address]    Report Status 05/17/2017 FINAL  Final   Organism ID, Bacteria ESCHERICHIA COLI (A)  Final      Susceptibility   Escherichia coli - MIC*    AMPICILLIN >=32 RESISTANT Resistant      CEFAZOLIN >=64 RESISTANT Resistant     CEFTRIAXONE >=64 RESISTANT Resistant     CIPROFLOXACIN >=4 RESISTANT Resistant     GENTAMICIN <=1 SENSITIVE Sensitive     IMIPENEM <=0.25 SENSITIVE Sensitive     NITROFURANTOIN 64 INTERMEDIATE Intermediate     TRIMETH/SULFA >=320 RESISTANT Resistant     AMPICILLIN/SULBACTAM >=32 RESISTANT Resistant     PIP/TAZO <=4 SENSITIVE Sensitive     Extended ESBL POSITIVE Resistant     * >=100,000 COLONIES/mL ESCHERICHIA COLI   Creatinine: Recent Labs    05/15/17 0803 05/21/17 1428  CREATININE 0.96 1.18*    Impression/Assessment/Plan:  1) Asymptomatic bacteriuria- per multiple guidelines I have read, asymptomatic bacteriuria does not need antibiotic treatment.  As is the case with this patient, treatment of asymptomatic bacteriuria will not rid her of the bacteria and will not prevent symptomatic infections in the future. Antibiotics would be appropriate if she had any symptoms (fever, bladder pain, dysuria or new onset frequency and urgency,  etc).  Also, urine odor and cloudiness (or discoloration as the patient calls it) is also indicative of bacteriuria but not infection.  Having said that, she is neutropenic and ID would have a better opinion as to whether she warrants prophylactic antibiotics in her specific situation and I defer to their expertise. Unfortunately, at this point there are not many abx available. There were no urologic factors of concern on her imaging or history that would predispose her to urinary tract infection.  Patient denies any other worrisome symptoms such as flank pain or gross hematuria.  2) microscopic hematuria - pt without gross hematuria, but several ua's show microscopic hematuria. I'll have her f/u in the outpt office to consider cystoscopy.   Will sign off. Please page GU with any questions, concerns or changes in patient status.   Festus Aloe 05/21/2017, 9:34 PM

## 2017-05-21 NOTE — Progress Notes (Signed)
Pt alert and oriented. Oxygen set up upon arrival to floor.

## 2017-05-21 NOTE — Progress Notes (Signed)
Pharmacy Antibiotic Note  [name] [name] is a 77 y.o. female admitted on 05/21/2017 with ESBL UTI.  Pharmacy has been consulted for Meropenem dosing.  Plan: Meropenem 1g IV q12h  Height: 5\' 2"  (157.5 cm) Weight: 110 lb 10.7 oz (50.2 kg) IBW/kg (Calculated) : 50.1  Temp (24hrs), Avg:98.4 F (36.9 C), Min:98 F (36.7 C), Max:98.7 F (37.1 C)  Recent Labs  Lab 05/15/17 0803 05/21/17 1428  WBC 3.9 1.9*  CREATININE 0.96 1.18*    Estimated Creatinine Clearance: 33.1 mL/min (A) (by C-G formula based on SCr of 1.18 mg/dL (H)).    Allergies  Allergen Reactions  . Contrast Media [Iodinated Diagnostic Agents] Other (See Comments)    Pt was sent to the ED following contrast media injection at [place]. Unknown reason. She has been premedicated since without complications. Pt to be premedicated prior to contrast media injections  . Ioxaglate Other (See Comments)    Pt was sent to the ED following contrast media injection at [place]. Unknown reason. She has been premedicated since without complications. Pt to be premedicated prior to contrast media injections  . Sulfa Antibiotics Other (See Comments)    Unknown  . Tetracycline Hives  . White Petrolatum Other (See Comments)    Blisters  . Amoxicillin-Pot Clavulanate Rash and Other (See Comments)    Blisters in mouth Has patient had a PCN reaction causing immediate rash, facial/tongue/throat swelling, SOB or lightheadedness with hypotension: No Has patient had a PCN reaction causing severe rash involving mucus membranes or skin necrosis: No Has patient had a PCN reaction that required hospitalization: No Has patient had a PCN reaction occurring within the last 10 years: No If all of the above answers are "NO", then may proceed with Cephalosporin use.   . Tape Rash    Thank you for allowing pharmacy to be a part of this patient's care.  Paulina Fusi, PharmD, BCPS 05/21/2017 7:27 PM

## 2017-05-22 ENCOUNTER — Ambulatory Visit: Payer: Medicare HMO | Admitting: Internal Medicine

## 2017-05-22 ENCOUNTER — Inpatient Hospital Stay: Payer: Medicare HMO | Admitting: Certified Registered Nurse Anesthetist

## 2017-05-22 ENCOUNTER — Encounter
Admission: AD | Disposition: A | Discharge status: Home / Self Care | Payer: Self-pay | Source: Ambulatory Visit | Attending: Internal Medicine

## 2017-05-22 ENCOUNTER — Inpatient Hospital Stay: Payer: Medicare HMO

## 2017-05-22 ENCOUNTER — Encounter: Payer: Self-pay | Admitting: Anesthesiology

## 2017-05-22 HISTORY — PX: KYPHOPLASTY: SHX5884

## 2017-05-22 LAB — CBC
HEMATOCRIT: 25.6 % — AB (ref 35.0–47.0)
HEMOGLOBIN: 8.6 g/dL — AB (ref 12.0–16.0)
MCH: 31.3 pg (ref 26.0–34.0)
MCHC: 33.7 g/dL (ref 32.0–36.0)
MCV: 92.7 fL (ref 80.0–100.0)
Platelets: 110 10*3/uL — ABNORMAL LOW (ref 150–440)
RBC: 2.76 MIL/uL — AB (ref 3.80–5.20)
RDW: 16 % — ABNORMAL HIGH (ref 11.5–14.5)
WBC: 1.7 10*3/uL — ABNORMAL LOW (ref 3.6–11.0)

## 2017-05-22 LAB — BASIC METABOLIC PANEL
ANION GAP: 7 (ref 5–15)
BUN: 14 mg/dL (ref 6–20)
CHLORIDE: 98 mmol/L — AB (ref 101–111)
CO2: 27 mmol/L (ref 22–32)
CREATININE: 1.04 mg/dL — AB (ref 0.44–1.00)
Calcium: 8.2 mg/dL — ABNORMAL LOW (ref 8.9–10.3)
GFR calc non Af Amer: 52 mL/min — ABNORMAL LOW (ref >60)
GFR, EST AFRICAN AMERICAN: 60 mL/min — AB (ref >60)
Glucose, Bld: 96 mg/dL (ref 65–99)
POTASSIUM: 3.8 mmol/L (ref 3.5–5.1)
SODIUM: 132 mmol/L — AB (ref 135–145)

## 2017-05-22 SURGERY — KYPHOPLASTY
Anesthesia: General

## 2017-05-22 MED ORDER — FENTANYL CITRATE (PF) 100 MCG/2ML IJ SOLN
25.0000 ug | INTRAMUSCULAR | Status: DC | PRN
  Administered 2017-05-22 (×2): 25 ug via INTRAVENOUS

## 2017-05-22 MED ORDER — OXYCODONE HCL 5 MG/5ML PO SOLN: 5.0000 mg | Freq: Once | ORAL | Status: DC | PRN

## 2017-05-22 MED ORDER — SODIUM CHLORIDE 0.9% FLUSH
10.0000 mL | Freq: Two times a day (BID) | INTRAVENOUS | Status: DC
  Administered 2017-05-22 – 2017-05-23 (×2): 10 mL

## 2017-05-22 MED ORDER — METOCLOPRAMIDE HCL 5 MG/ML IJ SOLN: 5.0000 mg | Freq: Three times a day (TID) | INTRAMUSCULAR | Status: DC | PRN

## 2017-05-22 MED ORDER — KETOROLAC TROMETHAMINE 30 MG/ML IJ SOLN
15.0000 mg | Freq: Four times a day (QID) | INTRAMUSCULAR | Status: DC | PRN
  Administered 2017-05-22 (×2): 15 mg via INTRAVENOUS
  Filled 2017-05-22 (×2): qty 1

## 2017-05-22 MED ORDER — PHENYLEPHRINE HCL 10 MG/ML IJ SOLN
INTRAMUSCULAR | Status: DC | PRN
  Administered 2017-05-22 (×2): 100 ug via INTRAVENOUS

## 2017-05-22 MED ORDER — OXYCODONE HCL 5 MG PO TABS: 5.0000 mg | ORAL_TABLET | Freq: Once | ORAL | Status: DC | PRN

## 2017-05-22 MED ORDER — POLYETHYLENE GLYCOL 3350 17 G PO PACK: 17.0000 g | PACK | Freq: Every day | ORAL | Status: DC | PRN

## 2017-05-22 MED ORDER — DOCUSATE SODIUM 100 MG PO CAPS
100.0000 mg | ORAL_CAPSULE | Freq: Two times a day (BID) | ORAL | Status: DC
  Administered 2017-05-23 (×2): 100 mg via ORAL
  Filled 2017-05-22 (×3): qty 1

## 2017-05-22 MED ORDER — FENTANYL CITRATE (PF) 100 MCG/2ML IJ SOLN
INTRAMUSCULAR | Status: AC
  Administered 2017-05-22: 25 ug via INTRAVENOUS
  Filled 2017-05-22: qty 2

## 2017-05-22 MED ORDER — MAGNESIUM CITRATE PO SOLN: 1.0000 | Freq: Once | ORAL | Status: DC | PRN

## 2017-05-22 MED ORDER — BISACODYL 10 MG RE SUPP
10.0000 mg | Freq: Every day | RECTAL | Status: DC | PRN
  Filled 2017-05-22: qty 1

## 2017-05-22 MED ORDER — CLINDAMYCIN PHOSPHATE 600 MG/50ML IV SOLN
600.0000 mg | Freq: Once | INTRAVENOUS | Status: AC
  Administered 2017-05-22: 600 mg via INTRAVENOUS
  Filled 2017-05-22: qty 50

## 2017-05-22 MED ORDER — LACTATED RINGERS IV SOLN
INTRAVENOUS | Status: DC | PRN
  Administered 2017-05-22: 16:00:00 via INTRAVENOUS

## 2017-05-22 MED ORDER — SODIUM CHLORIDE 0.9% FLUSH: 10.0000 mL | INTRAVENOUS | Status: DC | PRN

## 2017-05-22 MED ORDER — ENOXAPARIN SODIUM 40 MG/0.4ML ~~LOC~~ SOLN
40.0000 mg | SUBCUTANEOUS | Status: DC
  Administered 2017-05-23: 12:00:00 40 mg via SUBCUTANEOUS
  Filled 2017-05-22: qty 0.4

## 2017-05-22 MED ORDER — PROPOFOL 10 MG/ML IV BOLUS
INTRAVENOUS | Status: DC | PRN
  Administered 2017-05-22 (×4): 20 mg via INTRAVENOUS

## 2017-05-22 MED ORDER — LIDOCAINE HCL 1 % IJ SOLN
INTRAMUSCULAR | Status: DC | PRN
Start: 2017-05-22 — End: 2017-05-22
  Administered 2017-05-22: 35 mL

## 2017-05-22 MED ORDER — METOCLOPRAMIDE HCL 5 MG PO TABS
5.0000 mg | ORAL_TABLET | Freq: Three times a day (TID) | ORAL | Status: DC | PRN
  Administered 2017-05-23: 10 mg via ORAL
  Filled 2017-05-22: qty 2

## 2017-05-22 MED ORDER — BUPIVACAINE-EPINEPHRINE (PF) 0.5% -1:200000 IJ SOLN
INTRAMUSCULAR | Status: DC | PRN
  Administered 2017-05-22: 25 mL via PERINEURAL

## 2017-05-22 MED ORDER — TBO-FILGRASTIM 300 MCG/0.5ML ~~LOC~~ SOSY
300.0000 ug | PREFILLED_SYRINGE | Freq: Every day | SUBCUTANEOUS | Status: DC
  Administered 2017-05-22 – 2017-05-23 (×2): 300 ug via SUBCUTANEOUS
  Filled 2017-05-22 (×2): qty 0.5

## 2017-05-22 MED ORDER — FENTANYL CITRATE (PF) 100 MCG/2ML IJ SOLN
INTRAMUSCULAR | Status: AC
  Filled 2017-05-22: qty 2

## 2017-05-22 SURGICAL SUPPLY — 18 items
CEMENT KYPHON CX01A KIT/MIXER (Cement) ×3 IMPLANT
DERMABOND ADVANCED (GAUZE/BANDAGES/DRESSINGS) ×2
DERMABOND ADVANCED .7 DNX12 (GAUZE/BANDAGES/DRESSINGS) ×1 IMPLANT
DEVICE BIOPSY BONE KYPH (INSTRUMENTS) ×3 IMPLANT
DEVICE BIOPSY BONE KYPHX (INSTRUMENTS) ×3 IMPLANT
DRAPE C-ARM XRAY 36X54 (DRAPES) ×3 IMPLANT
DURAPREP 26ML APPLICATOR (WOUND CARE) ×3 IMPLANT
GLOVE SURG SYN 9.0  PF PI (GLOVE) ×2
GLOVE SURG SYN 9.0 PF PI (GLOVE) ×1 IMPLANT
GOWN SRG 2XL LVL 4 RGLN SLV (GOWNS) ×1 IMPLANT
GOWN STRL NON-REIN 2XL LVL4 (GOWNS) ×2
GOWN STRL REUS W/ TWL LRG LVL3 (GOWN DISPOSABLE) ×1 IMPLANT
GOWN STRL REUS W/TWL LRG LVL3 (GOWN DISPOSABLE) ×2
PACK KYPHOPLASTY (MISCELLANEOUS) ×3 IMPLANT
STRAP SAFETY 5IN WIDE (MISCELLANEOUS) ×3 IMPLANT
TRAY KYPHOPAK 15/2 EXPRESS (KITS) ×3 IMPLANT
TRAY KYPHOPAK 15/3 EXPRESS 1ST (MISCELLANEOUS) IMPLANT
TRAY KYPHOPAK 20/3 EXPRESS 1ST (MISCELLANEOUS) IMPLANT

## 2017-05-22 NOTE — Progress Notes (Signed)
Initial Nutrition Assessment  DOCUMENTATION CODES:   Non-severe (moderate) malnutrition in context of chronic illness  INTERVENTION:  Hormel Shake BID, each supplement provides 520 calories and 22 grams of protein with diet advancement  NUTRITION DIAGNOSIS:   Moderate Malnutrition related to chronic illness as evidenced by moderate muscle depletion, moderate fat depletion, mild muscle depletion, mild fat depletion.  GOAL:   Patient will meet greater than or equal to 90% of their needs  MONITOR:   PO intake, Weight trends, Supplement acceptance  REASON FOR ASSESSMENT:   Malnutrition Screening Tool    ASSESSMENT:   [name] [name]  is a 77 y.o. female with a known history of chronic respiratory failure on home oxygen 3 L, COPD, lung cancer on chemotherapy, breast cancer, hypertension, CHF, neuropathy and RA Presents with T7/T8 compression fracture, ES BL UTI, Possible postobstructive PNA.  Patient seen for positive MST Spoke with [name] [name] at bedside. 24 hour recall as follows:  Breakfast: Nothing Lunch: Bagel with cream cheese Dinner: Bagel with cream cheese Snacks: Cookies or Milkshakes before bed  She states her weight has been 112 pounds at the last 3 doctor visits with a 10 pound weight loss over the past year before that, but according to chart her weight has been stable between 110 - 117 pounds since 2017.  Given this information RD questioned her about PO intake. She states that for the past month she has stopped eating what her son cooks. Apparently he likes to cook pasta, spaghetti and pizza type things which she says she no longer wants to eat (son cooks all meals). She does state he will bring her shrimp at times and she will eat the whole bag. Sounds like PO intake varies from day to day based on patient's son's consideration of patient's food preferences.  She is hungry today, was NPO during time of visit for surgery  Ambulates well at baseline but states over  the past month with her fractures she has tried to sit still and not move much in order to decrease pain.  She is open to consuming chocolate hormel shake. Does not like ensure. Provide with diet advancement.  Labs reviewed:  Na 132  Medications reviewed and include:  NS at 6mL/hr  Carboplatin + Abraxane + Pembrolizumab Day 15, Cycle 3 as an outpatient   NUTRITION - FOCUSED PHYSICAL EXAM:    Most Recent Value  Orbital Region  Mild depletion  Upper Arm Region  Moderate depletion  Thoracic and Lumbar Region  Moderate depletion  Buccal Region  No depletion  [place] Region  Mild depletion  Clavicle Bone Region  Mild depletion  Clavicle and Acromion Bone Region  Mild depletion  Scapular Bone Region  Moderate depletion  Dorsal Hand  Moderate depletion  Patellar Region  Moderate depletion  Anterior Thigh Region  Moderate depletion  Posterior Calf Region  Moderate depletion       Diet Order:   Diet Order           Diet NPO time specified  Diet effective 0500          EDUCATION NEEDS:   Not appropriate for education at this time  Skin:  Skin Assessment: Reviewed RN Assessment(BL Ecchymosis to arms)  Last BM:  PTA  Height:   Ht Readings from Last 1 Encounters:  05/21/17 5\' 2"  (1.575 m)    Weight:   Wt Readings from Last 1 Encounters:  05/21/17 110 lb 10.7 oz (50.2 kg)    Ideal Body Weight:  50 kg  BMI:  Body mass index is 20.24 kg/m.  Estimated Nutritional Needs:   Kcal:  1500-1750 calories (30-35 cal/kg)  Protein:  75-85 grams (1.5-1.7g/kg)  Fluid:  1.5-1.8L  Satira Anis. [name] Judson, MS, RD LDN Inpatient Clinical Dietitian Pager [phone]

## 2017-05-22 NOTE — Progress Notes (Signed)
Pharmacy Antibiotic Note  [name] [name] is a 77 y.o. female admitted on 05/21/2017 with ESBL UTI.  Pharmacy has been consulted for Meropenem dosing.  Plan: Meropenem 1g IV q12h  Height: 5\' 2"  (157.5 cm) Weight: 110 lb 10.7 oz (50.2 kg) IBW/kg (Calculated) : 50.1  Temp (24hrs), Avg:98.2 F (36.8 C), Min:97.6 F (36.4 C), Max:98.7 F (37.1 C)  Recent Labs  Lab 05/21/17 1428 05/22/17 0503  WBC 1.9* 1.7*  CREATININE 1.18* 1.04*    Estimated Creatinine Clearance: 37.5 mL/min (A) (by C-G formula based on SCr of 1.04 mg/dL (H)).    Allergies  Allergen Reactions  . Contrast Media [Iodinated Diagnostic Agents] Other (See Comments)    Pt was sent to the ED following contrast media injection at [place]. Unknown reason. She has been premedicated since without complications. Pt to be premedicated prior to contrast media injections  . Ioxaglate Other (See Comments)    Pt was sent to the ED following contrast media injection at [place]. Unknown reason. She has been premedicated since without complications. Pt to be premedicated prior to contrast media injections  . Sulfa Antibiotics Other (See Comments)    Unknown  . Tetracycline Hives  . White Petrolatum Other (See Comments)    Blisters  . Amoxicillin-Pot Clavulanate Rash and Other (See Comments)    Blisters in mouth Has patient had a PCN reaction causing immediate rash, facial/tongue/throat swelling, SOB or lightheadedness with hypotension: No Has patient had a PCN reaction causing severe rash involving mucus membranes or skin necrosis: No Has patient had a PCN reaction that required hospitalization: No Has patient had a PCN reaction occurring within the last 10 years: No If all of the above answers are "NO", then may proceed with Cephalosporin use.   . Tape Rash   Meropenem 5/9 >> UCx sent UCx 5/3 with ESBL E coli  Thank you for allowing pharmacy to be a part of this patient's care.  Rayna Sexton, PharmD,  BCPS Clinical Pharmacist 05/22/2017 11:02 AM

## 2017-05-22 NOTE — Op Note (Signed)
05/22/2017  4:54 PM  PATIENT:  [name] [name]  77 y.o. female  PRE-OPERATIVE DIAGNOSIS:  thoracic compression fractures T7 and T8  POST-OPERATIVE DIAGNOSIS:  thoracic compression fractures T7 and T8  PROCEDURE:  Procedure(s): KYPHOPLASTY-T7, T8 (N/A)  SURGEON: Laurene Footman, MD  ASSISTANTS: None  ANESTHESIA:   local and MAC  EBL:  Total I/O In: -  Out: 2 [Blood:2]  BLOOD ADMINISTERED:none  DRAINS: none   LOCAL MEDICATIONS USED:  MARCAINE    and XYLOCAINE   SPECIMEN:  Source of Specimen:  T7 and T8 vertebral body biopsies  DISPOSITION OF SPECIMEN:  PATHOLOGY  COUNTS:  YES  TOURNIQUET:  * No tourniquets in log *  IMPLANTS: Bone cement  DICTATION: .Dragon Dictation patient was brought to the operating room and after adequate sedation was given the patient was placed prone.  Serum was brought into good visualization of the compressed T8 and further compressed T7 were obtained in both AP and lateral projections.  Patient identification and timeout procedure were completed and after prepping the skin with alcohol 10 cc of 1% Xylocaine was infiltrated in the area of the planned incisions.  The back was then prepped and draped in sterile manner and repeat timeout procedure carried out.  Spinal needle was used to get down to the pedicle on the right of T8 and right and left at T7 with approximately 7/2 cc of 1% Xylocaine and the same amount of half percent Sensorcaine with epinephrine.  After allowing this to set a small incision was made first at T8 on the right side and trocar was advanced in an extrapedicular approach with biopsy obtained care being taken to stay lateral to the medial wall of the pedicle until the vertebral body was entered drilling was carried out followed by inflation of the balloon to about 2 cc.  Going to T7 identical procedure carried out in the biopsy was obtained the vertebral body is quite thin but with inflation it did elevate some and approximately a cc of  inflation was obtained.  After the cement was appropriate consistency approximately 2 cc was infiltrated in the T8 without extravasation except for a small amount into the vessel and at T7 good fill of the vertebral body with cement going across the midline with approximately a cc and a half of cement placed.  After the cement had set trochars were removed with permanent serum views obtained.  The wounds were closed with Dermabond followed by Band-Aids and patient sent to recovery in stable condition  PLAN OF CARE: Continue as inpatient  PATIENT DISPOSITION:  PACU - hemodynamically stable.

## 2017-05-22 NOTE — Progress Notes (Signed)
[facility] [place] NAME: [name] [name]    MR#:  [id number]  DATE OF BIRTH:  April 05, 1940  SUBJECTIVE:   Patient directly admitted from oncology's office due to back pain and noted to have a T7 compression fracture.  Patient also noted to have positive urine cultures a week ago for ESBL and was treated with oral Levaquin which is not sensitive to, but patient is clinically asymptomatic.  REVIEW OF SYSTEMS:    Review of Systems  Constitutional: Negative for chills and fever.  HENT: Negative for congestion and tinnitus.   Eyes: Negative for blurred vision and double vision.  Respiratory: Negative for cough, shortness of breath and wheezing.   Cardiovascular: Negative for chest pain, orthopnea and PND.  Gastrointestinal: Negative for abdominal pain, diarrhea, nausea and vomiting.  Genitourinary: Negative for dysuria and hematuria.  Musculoskeletal: Positive for back pain.  Neurological: Negative for dizziness, sensory change and focal weakness.  All other systems reviewed and are negative.   Nutrition: NPO for Kyphoplasty Tolerating Diet: No Tolerating PT: Await Eval.    DRUG ALLERGIES:   Allergies  Allergen Reactions  . Contrast Media [Iodinated Diagnostic Agents] Other (See Comments)    Pt was sent to the ED following contrast media injection at [facility]. Unknown reason. She has been premedicated since without complications. Pt to be premedicated prior to contrast media injections  . Ioxaglate Other (See Comments)    Pt was sent to the ED following contrast media injection at [facility]. Unknown reason. She has been premedicated since without complications. Pt to be premedicated prior to contrast media injections  . Sulfa Antibiotics Other (See Comments)    Unknown  . Tetracycline Hives  . White Petrolatum Other (See Comments)    Blisters  . Amoxicillin-Pot Clavulanate Rash and Other (See Comments)    Blisters in  mouth Has patient had a PCN reaction causing immediate rash, facial/tongue/throat swelling, SOB or lightheadedness with hypotension: No Has patient had a PCN reaction causing severe rash involving mucus membranes or skin necrosis: No Has patient had a PCN reaction that required hospitalization: No Has patient had a PCN reaction occurring within the last 10 years: No If all of the above answers are "NO", then may proceed with Cephalosporin use.   . Tape Rash    VITALS:  Blood pressure (!) 111/54, pulse 93, temperature 98 F (36.7 C), temperature source Oral, resp. rate 17, height 5\' 2"  (1.575 m), weight 50.2 kg (110 lb 10.7 oz), SpO2 100 %.  PHYSICAL EXAMINATION:   Physical Exam  GENERAL:  77 y.o.-year-old patient lying in bed in no acute distress.  EYES: Pupils equal, round, reactive to light and accommodation. No scleral icterus. Extraocular muscles intact.  HEENT: Head atraumatic, normocephalic. Oropharynx and nasopharynx clear.  NECK:  Supple, no jugular venous distention. No thyroid enlargement, no tenderness.  LUNGS: Normal breath sounds bilaterally, no wheezing, rales, rhonchi. No use of accessory muscles of respiration.  CARDIOVASCULAR: S1, S2 normal. No murmurs, rubs, or gallops.  ABDOMEN: Soft, nontender, nondistended. Bowel sounds present. No organomegaly or mass.  EXTREMITIES: No cyanosis, clubbing or edema b/l.    NEUROLOGIC: Cranial nerves II through XII are intact. No focal Motor or sensory deficits b/l. Globally weak.    PSYCHIATRIC: The patient is alert and oriented x 3.  SKIN: No obvious rash, lesion, or ulcer.    LABORATORY PANEL:   CBC Recent Labs  Lab 05/22/17 0503  WBC 1.7*  HGB 8.6*  HCT 25.6*  PLT 110*   ------------------------------------------------------------------------------------------------------------------  Chemistries  Recent Labs  Lab 05/22/17 0503  NA 132*  K 3.8  CL 98*  CO2 27  GLUCOSE 96  BUN 14  CREATININE 1.04*  CALCIUM  8.2*   ------------------------------------------------------------------------------------------------------------------  Cardiac Enzymes No results for input(s): TROPONINI in the last 168 hours. ------------------------------------------------------------------------------------------------------------------  RADIOLOGY:  Dg Chest Port 1 View  Result Date: 05/21/2017 CLINICAL DATA:  Lung cancer patient. Direct admission from a collagen for cough for several weeks. Right-sided chest pain today. EXAM: PORTABLE CHEST 1 VIEW COMPARISON:  04/16/2017 FINDINGS: Cardiac silhouette is normal size.  No mediastinal or hilar masses. Opacity at the left lung base is similar to the prior studies consistent with a combination of pleural fluid, left lower lobe atelectasis, and dilation of the descending thoracic aorta. Lungs show prominent interstitial markings, but are otherwise clear. No right pleural effusion. No pneumothorax. Right anterior chest wall Port-A-Cath is stable. Skeletal structures are grossly intact. IMPRESSION: 1. No acute findings when compared to the exam is performed in April 2019. 2. There is left lung base opacity. On a CT dated 04/30/2017, there was left lower lobe atelectasis, left pleural effusion and a dilated descending thoracic aorta accounting for this opacity. Electronically Signed   By: Lajean Manes M.D.   On: 05/21/2017 20:16     ASSESSMENT AND PLAN:   77 year old female with past medical history of lung cancer with metastatic disease, hypertension, fibromyalgia, rheumatoid arthritis, history of breast cancer, CHF who presented to the hospital due to back pain and noted to have a T7 compression fracture.  1.  Back pain/T7 compression fracture- this is from metastatic lung cancer. - Continue supportive care with pain control with fentanyl patch, Toradol, oxycodone. -Seen by orthopedics and plan for kyphoplasty later today.  2.  Urinary tract infection- patient is clinically  asymptomatic with no dysuria, fever, chills, frequency.  This was based off a urine culture positive about a week ago for ESBL.  Patient was treated as an outpatient with oral Levaquin. - Currently on IV meropenem, await further infectious disease input regarding treatment as patient is clinically asymptomatic but is leukopenic but not neutropenic. - I have sent off fosfomycin sensitivities on the urine culture from a week ago.  3.  Lung cancer with metastatic disease-patient is followed by Dr. Mike Gip.  Currently undergoing treatment. -Continue further care as per them.  4.  COPD-no acute exacerbation, continue Spiriva, duo nebs as needed.  5.  Essential hypertension-continue carvedilol.  6.  Anxiety-continue Klonopin.     All the records are reviewed and case discussed with Care Management/Social Worker. Management plans discussed with the patient, family and they are in agreement.  CODE STATUS: Full code  DVT Prophylaxis: Ted's and SCD's.   TOTAL TIME TAKING CARE OF THIS PATIENT: 30 minutes.   POSSIBLE D/C IN 1-2 DAYS, DEPENDING ON CLINICAL CONDITION.   Henreitta Leber M.D on 05/22/2017 at 2:58 PM  Between 7am to 6pm - Pager - [phone]  After 6pm go to [email or web address] - Proofreader  [facility]  Office  [phone]  CC: Primary care physician; Tracie Harrier, MD

## 2017-05-22 NOTE — Anesthesia Preprocedure Evaluation (Signed)
Anesthesia Evaluation  Patient identified by MRN, date of birth, ID band Patient awake    Reviewed: Allergy & Precautions, H&P , NPO status , Patient's Chart, lab work & pertinent test results  History of Anesthesia Complications Negative for: history of anesthetic complications  Airway Mallampati: III  TM Distance: <3 FB Neck ROM: limited    Dental  (+) Chipped, Poor Dentition   Pulmonary shortness of breath, asthma , pneumonia, COPD, Current Smoker,           Cardiovascular Exercise Tolerance: Poor hypertension, (-) angina+ CAD, + Peripheral Vascular Disease and +CHF  (-) Past MI and (-) Cardiac Stents      Neuro/Psych PSYCHIATRIC DISORDERS  Neuromuscular disease CVA, Residual Symptoms    GI/Hepatic negative GI ROS, Neg liver ROS,   Endo/Other  negative endocrine ROS  Renal/GU CRFRenal disease  negative genitourinary   Musculoskeletal  (+) Arthritis , Fibromyalgia -  Abdominal   Peds  Hematology negative hematology ROS (+)   Anesthesia Other Findings Past Medical History: No date: Asthma 2009: Breast cancer ([place])     Comment:  left No date: Cancer ([place]) No date: CHF (congestive heart failure) (HCC) No date: CHF (congestive heart failure) (HCC) No date: Chronic UTI No date: COPD (chronic obstructive pulmonary disease) (HCC) No date: Dizziness No date: Fibromyalgia No date: Hypertension No date: Neuropathy 05/17/2015: Personal history of tobacco use, presenting hazards to  health No date: Polyp, larynx No date: RA (rheumatoid arthritis) (HCC) No date: Sinus infection     Comment:  recent No date: Stumbling gait     Comment:  to the left No date: Supplemental oxygen dependent     Comment:  2.5l  Past Surgical History: No date: ABDOMINAL HYSTERECTOMY 2009: BREAST LUMPECTOMY; Left     Comment:  chemo and radiation 02/27/2015: CYST EXCISION; Left     Comment:  Procedure: CYST REMOVAL;  Surgeon: Hessie Knows, MD;                Location: [facility] OR[facility];  Service: Orthopedics;  Laterality:               Left; No date: EYE MUSCLE SURGERY; Right     Comment:  13 surgeries 07/01/2016: FLEXIBLE BRONCHOSCOPY; N/A     Comment:  Procedure: FLEXIBLE BRONCHOSCOPY;  Surgeon: Wilhelmina Mcardle, MD;  Location: [facility] ORS;  Service: Pulmonary;                Laterality: N/A; 02/09/2017: FLEXIBLE BRONCHOSCOPY; N/A     Comment:  Procedure: FLEXIBLE BRONCHOSCOPY;  Surgeon:               [name], MD;  Location: [facility] ORS;  Service:              Pulmonary;  Laterality: N/A; 03/04/2017: PORTA CATH INSERTION; N/A     Comment:  Procedure: PORTA CATH INSERTION;  Surgeon: [name],              MD;  Location: [facility];  Service:               Cardiovascular;  Laterality: N/A; No date: THUMB ARTHROSCOPY; Left  BMI    Body Mass Index:  20.24 kg/m      Reproductive/Obstetrics negative OB ROS  Anesthesia Physical Anesthesia Plan  ASA: IV  Anesthesia Plan: General   Post-op Pain Management:    Induction: Intravenous  PONV Risk Score and Plan: Propofol infusion, TIVA and Midazolam  Airway Management Planned: Natural Airway and Nasal Cannula  Additional Equipment:   Intra-op Plan:   Post-operative Plan:   Informed Consent: I have reviewed the patients History and Physical, chart, labs and discussed the procedure including the risks, benefits and alternatives for the proposed anesthesia with the patient or authorized representative who has indicated his/her understanding and acceptance.   Dental Advisory Given  Plan Discussed with: Anesthesiologist, CRNA and Surgeon  Anesthesia Plan Comments: (Patient consented for risks of anesthesia including but not limited to:  - adverse reactions to medications - risk of intubation if required - damage to teeth, lips or other oral mucosa - sore throat or hoarseness -  Damage to heart, brain, lungs or loss of life  Patient voiced understanding.)        Anesthesia Quick Evaluation

## 2017-05-22 NOTE — Transfer of Care (Signed)
Immediate Anesthesia Transfer of Care Note  Patient: [name] [name]  Procedure(s) Performed: Oliva Bustard, T8 (N/A )  Patient Location: PACU  Anesthesia Type:General  Level of Consciousness: awake, alert , oriented and patient cooperative  Airway & Oxygen Therapy: Patient Spontanous Breathing and Patient connected to nasal cannula oxygen  Post-op Assessment: Report given to RN, Post -op Vital signs reviewed and stable and Patient moving all extremities  Post vital signs: Reviewed and stable  Last Vitals:  Vitals Value Taken Time  BP 117/64 05/22/2017  4:54 PM  Temp 36.8 C 05/22/2017  4:54 PM  Pulse 79 05/22/2017  4:59 PM  Resp 22 05/22/2017  4:59 PM  SpO2 97 % 05/22/2017  4:59 PM  Vitals shown include unvalidated device data.  Last Pain:  Vitals:   05/22/17 1654  TempSrc:   PainSc: 0-No pain         Complications: No apparent anesthesia complications

## 2017-05-22 NOTE — Anesthesia Postprocedure Evaluation (Signed)
Anesthesia Post Note  Patient: SWAN FAIRFAX  Procedure(s) Performed: Oliva Bustard, T8 (N/A )  Patient location during evaluation: PACU Anesthesia Type: General Level of consciousness: awake and alert Pain management: pain level controlled Vital Signs Assessment: post-procedure vital signs reviewed and stable Respiratory status: spontaneous breathing, nonlabored ventilation, respiratory function stable and patient connected to nasal cannula oxygen Cardiovascular status: blood pressure returned to baseline and stable Postop Assessment: no apparent nausea or vomiting Anesthetic complications: no     Last Vitals:  Vitals:   05/22/17 1729 05/22/17 1747  BP: 124/67 126/71  Pulse: 79 80  Resp: 12 20  Temp:  (!) 36.3 C  SpO2: 93% 98%    Last Pain:  Vitals:   05/22/17 1747  TempSrc: Oral  PainSc:                  Precious Haws Piscitello

## 2017-05-22 NOTE — Anesthesia Post-op Follow-up Note (Signed)
Anesthesia QCDR form completed.        

## 2017-05-22 NOTE — Consult Note (Signed)
Chief complaint is back pain Reason for consult is for evaluation and potential treatment for thoracic compression fractures History: Patient is a 77 year old who suffers from rheumatoid arthritis as well as metastatic cancer she has had back pain for about a month without a history of injury.  She recently had an MRI that showed a significant compression to T7 as well as a superior endplate early compression fracture at T8.  T7 appears to be somewhat subacute consistent with her symptoms lasting a month.  She reports that the pain has gotten steadily worse.  On examination she has no clonus no neuro deficit noted.  She has exquisite point tenderness at the T7-T8 area in the midline.  She has a great deal of difficulty moving in bed and is in obvious pain.  Impression is T7 and T8 compression fractures symptomatic over 1 month duration of symptoms  Recommendation is for kyphoplasty to get relief of the pain.  Risk benefits possible complications were discussed with patient including the use of a bone model.  I would like to do this later today if possible.

## 2017-05-22 NOTE — Consult Note (Signed)
[facility]     Reason for Consult:ESBL E coli    Referring Physician: Jeronimo Greaves Date of Admission:  05/21/2017   Active Problems:   UTI due to extended-spectrum beta lactamase (ESBL) producing Escherichia coli   HPI: [name] [name] is a 77 y.o. female with hx of metastatic squamous cell carcinoma of the left lower lobe with carboplatin, Abraxane, and pembrolizumab admitted with worsening back pain from T spine fracture, and fatigue. She has had otpt UA and UCX growing ESBL E coli and had sputum cx with corynebacterium. She was started on levofloxacin. Admitted mainly for pain control and kyphoplasty which she just had done.  Currently she denies any recent dysuria, hematuria, pelvic pain, urinary frequency. She has her baseline SOB and mild cough. Denies fevers, chills.  She has a portacath with no tenderness or redness.      Past Medical History:  Diagnosis Date  . Asthma   . Breast cancer ([place]) 2009   left  . Cancer ([place])   . CHF (congestive heart failure) ([place])   . CHF (congestive heart failure) ([place])   . Chronic UTI   . COPD (chronic obstructive pulmonary disease) ([place])   . Dizziness   . Fibromyalgia   . Hypertension   . Neuropathy   . Personal history of tobacco use, presenting hazards to health 05/17/2015  . Polyp, larynx   . RA (rheumatoid arthritis) ([place])   . Sinus infection    recent  . Stumbling gait    to the left  . Supplemental oxygen dependent    2.5l   Past Surgical History:  Procedure Laterality Date  . ABDOMINAL HYSTERECTOMY    . BREAST LUMPECTOMY Left 2009   chemo and radiation  . CYST EXCISION Left 02/27/2015   Procedure: CYST REMOVAL;  Surgeon: Hessie Knows, MD;  Location: [facility] ORS;  Service: Orthopedics;  Laterality: Left;  . EYE MUSCLE SURGERY Right    13 surgeries  . FLEXIBLE BRONCHOSCOPY N/A 07/01/2016   Procedure: FLEXIBLE BRONCHOSCOPY;  Surgeon: Wilhelmina Mcardle, MD;  Location: [facility] ORS;  Service: Pulmonary;  Laterality:  N/A;  . FLEXIBLE BRONCHOSCOPY N/A 02/09/2017   Procedure: FLEXIBLE BRONCHOSCOPY;  Surgeon: [name], MD;  Location: [facility] ORS;  Service: Pulmonary;  Laterality: N/A;  . PORTA CATH INSERTION N/A 03/04/2017   Procedure: PORTA CATH INSERTION;  Surgeon: [name], MD;  Location: [place] [facility];  Service: Cardiovascular;  Laterality: N/A;  . THUMB ARTHROSCOPY Left    Social History   Tobacco Use  . Smoking status: Current Every Day Smoker    Packs/day: 1.00    Years: 40.00    Pack years: 40.00    Types: Cigarettes  . Smokeless tobacco: Never Used  Substance Use Topics  . Alcohol use: No  . Drug use: No   Family History  Problem Relation Age of Onset  . Diabetes Father   . Stroke Father   . Heart attack Father   . CAD Sister     Allergies:  Allergies  Allergen Reactions  . Contrast Media [Iodinated Diagnostic Agents] Other (See Comments)    Pt was sent to the ED following contrast media injection at [facility]. Unknown reason. She has been premedicated since without complications. Pt to be premedicated prior to contrast media injections  . Ioxaglate Other (See Comments)    Pt was sent to the ED following contrast media injection at [facility]. Unknown reason. She has been premedicated since without complications. Pt  to be premedicated prior to contrast media injections  . Sulfa Antibiotics Other (See Comments)    Unknown  . Tetracycline Hives  . White Petrolatum Other (See Comments)    Blisters  . Amoxicillin-Pot Clavulanate Rash and Other (See Comments)    Blisters in mouth Has patient had a PCN reaction causing immediate rash, facial/tongue/throat swelling, SOB or lightheadedness with hypotension: No Has patient had a PCN reaction causing severe rash involving mucus membranes or skin necrosis: No Has patient had a PCN reaction that required hospitalization: No Has patient had a PCN reaction occurring within the last 10 years: No If all of  the above answers are "NO", then may proceed with Cephalosporin use.   . Tape Rash    Current antibiotics: Antibiotics Given (last 72 hours)    Date/Time Action Medication Dose Rate   05/21/17 2149 New Bag/Given   meropenem (MERREM) 1 g in sodium chloride 0.9 % 100 mL IVPB 1 g 200 mL/hr   05/22/17 1429 New Bag/Given   meropenem (MERREM) 1 g in sodium chloride 0.9 % 100 mL IVPB 1 g 200 mL/hr   05/22/17 1612 Given   clindamycin (CLEOCIN) IVPB 600 mg 600 mg       MEDICATIONS: . carvedilol  12.5 mg Oral BID WC  . docusate sodium  100 mg Oral BID  . [START ON 05/23/2017] enoxaparin (LOVENOX) injection  40 mg Subcutaneous Q24H  . fentaNYL  12.5 mcg Transdermal Q72H  . gabapentin  600 mg Oral TID  . HYDROcodone-acetaminophen  1 tablet Oral BID  . imipramine  25 mg Oral QHS  . sodium chloride flush  10-40 mL Intracatheter Q12H  . tiotropium  1 capsule Inhalation BID  . zolpidem  5 mg Oral QHS    Review of Systems - 11 systems reviewed and negative per HPI   OBJECTIVE: Temp:  [97.3 F (36.3 C)-98.3 F (36.8 C)] 97.4 F (36.3 C) (05/10 1747) Pulse Rate:  [77-113] 80 (05/10 1747) Resp:  [11-20] 20 (05/10 1747) BP: (96-154)/(54-88) 126/71 (05/10 1747) SpO2:  [93 %-100 %] 98 % (05/10 1747) Physical Exam  Constitutional:  Thin, chornically ill appearing but NAD HENT: [place]/AT, PERRLA, no scleral icterus Mouth/Throat: Oropharynx is clear and dry Cardiovascular: Normal rate, regular rhythm and normal heart sounds. Pulmonary/Chest: dec BS L basae  Portacath R chest wall wnl  Neck = supple, no nuchal rigidity Abdominal: Soft. Bowel sounds are normal.  exhibits no distension. There is no tenderness.  Lymphadenopathy: no cervical adenopathy. No axillary adenopathy Neurological: alert and oriented to person, place, and time.  Skin: Skin is warm and dry. No rash noted. No erythema.  Psychiatric: a normal mood and affect.  behavior is normal.    LABS: Results for orders placed or  performed during the hospital encounter of 05/21/17 (from the past 48 hour(s))  Urinalysis, Complete w Microscopic     Status: Abnormal   Collection Time: 05/21/17  9:41 PM  Result Value Ref Range   Color, Urine YELLOW (A) YELLOW   APPearance CLEAR (A) CLEAR   Specific Gravity, Urine 1.008 1.005 - 1.030   pH 5.0 5.0 - 8.0   Glucose, UA NEGATIVE NEGATIVE mg/dL   Hgb urine dipstick NEGATIVE NEGATIVE   Bilirubin Urine NEGATIVE NEGATIVE   Ketones, ur NEGATIVE NEGATIVE mg/dL   Protein, ur NEGATIVE NEGATIVE mg/dL   Nitrite NEGATIVE NEGATIVE   Leukocytes, UA SMALL (A) NEGATIVE   RBC / HPF 0-5 0 - 5 RBC/hpf   WBC, UA 0-5 0 -  5 WBC/hpf   Bacteria, UA NONE SEEN NONE SEEN   Squamous Epithelial / LPF 0-5 0 - 5   Mucus PRESENT    Non Squamous Epithelial 0-5 (A) NONE SEEN    Comment: Performed at [facility] [address] [place], [place] [address]  Basic metabolic panel     Status: Abnormal   Collection Time: 05/22/17  5:03 AM  Result Value Ref Range   Sodium 132 (L) 135 - 145 mmol/L   Potassium 3.8 3.5 - 5.1 mmol/L   Chloride 98 (L) 101 - 111 mmol/L   CO2 27 22 - 32 mmol/L   Glucose, Bld 96 65 - 99 mg/dL   BUN 14 6 - 20 mg/dL   Creatinine, Ser 1.04 (H) 0.44 - 1.00 mg/dL   Calcium 8.2 (L) 8.9 - 10.3 mg/dL   GFR calc non Af Amer 52 (L) >60 mL/min   GFR calc Af Amer 60 (L) >60 mL/min    Comment: (NOTE) The eGFR has been calculated using the CKD EPI equation. This calculation has not been validated in all clinical situations. eGFR's persistently <60 mL/min signify possible Chronic Kidney Disease.    Anion gap 7 5 - 15    Comment: Performed at [facility], [address] [place], [place] [address]  CBC     Status: Abnormal   Collection Time: 05/22/17  5:03 AM  Result Value Ref Range   WBC 1.7 (L) 3.6 - 11.0 K/uL   RBC 2.76 (L) 3.80 - 5.20 MIL/uL   Hemoglobin 8.6 (L) 12.0 - 16.0 g/dL   HCT 25.6 (L) 35.0 - 47.0 %   MCV 92.7 80.0 - 100.0 fL   MCH 31.3 26.0 -  34.0 pg   MCHC 33.7 32.0 - 36.0 g/dL   RDW 16.0 (H) 11.5 - 14.5 %   Platelets 110 (L) 150 - 440 K/uL    Comment: Performed at [facility], [address] [place], [place] [address]   No components found for: ESR, C REACTIVE PROTEIN MICRO: Recent Results (from the past 720 hour(s))  Urine Culture     Status: Abnormal   Collection Time: 05/08/17 11:15 AM  Result Value Ref Range Status   Specimen Description   Final    URINE, CLEAN CATCH Performed at [facility], [address], [place], [place] [address]    Special Requests   Final    NONE Performed at [facility], [address] [place], [place] [address]    Culture MULTIPLE SPECIES PRESENT, SUGGEST RECOLLECTION (A)  Final   Report Status 05/09/2017 FINAL  Final  Culture, sputum-assessment     Status: None   Collection Time: 05/15/17 10:53 AM  Result Value Ref Range Status   Specimen Description   Final    SPU Performed at [facility], [address] [place], [place] [address]    Special Requests   Final    NONE Performed at [facility], [address] [place], [place] [address]    Sputum evaluation   Final    THIS SPECIMEN IS ACCEPTABLE FOR SPUTUM CULTURE Performed at [facility], [address] [place], [place] [address]    Report Status 05/15/2017 FINAL  Final  Culture, respiratory (NON-Expectorated)     Status: None   Collection Time: 05/15/17 10:53 AM  Result Value Ref Range Status   Specimen Description   Final    SPU Performed at [facility], [place],  [place] [address]    Special Requests   Final    NONE Reflexed from [id number] Performed at [facility], [address] [place], [place] [address]    Gram Stain   Final    FEW WBC PRESENT, PREDOMINANTLY PMN ABUNDANT GRAM POSITIVE RODS    Culture   Final    ABUNDANT CORYNEBACTERIUM STRIATUM Standardized susceptibility testing for this organism is not  available. Performed at [facility], [place] [address], [place], [place] [address]    Report Status 05/17/2017 FINAL  Final  Urine Culture     Status: Abnormal (Preliminary result)   Collection Time: 05/15/17 10:56 AM  Result Value Ref Range Status   Specimen Description   Final    URINE, CLEAN CATCH Performed at [facility] [address] [place], [place] [address]    Special Requests   Final    NONE Performed at [facility], [address] [place], [place] [address]    Culture (A)  Final    >=100,000 COLONIES/mL ESCHERICHIA COLI Confirmed Extended Spectrum Beta-Lactamase Producer (ESBL).  In bloodstream infections from ESBL organisms, carbapenems are preferred over piperacillin/tazobactam. They are shown to have a lower risk of mortality. SENT TO [facility] FOR FOSFOMYCIN MIC Performed at [facility], [place] [address], [place], [place] [address]    Report Status PENDING  Incomplete   Organism ID, Bacteria ESCHERICHIA COLI (A)  Final      Susceptibility   Escherichia coli - MIC*    AMPICILLIN >=32 RESISTANT Resistant     CEFAZOLIN >=64 RESISTANT Resistant     CEFTRIAXONE >=64 RESISTANT Resistant     CIPROFLOXACIN >=4 RESISTANT Resistant     GENTAMICIN <=1 SENSITIVE Sensitive     IMIPENEM <=0.25 SENSITIVE Sensitive     NITROFURANTOIN 64 INTERMEDIATE Intermediate     TRIMETH/SULFA >=320 RESISTANT Resistant     AMPICILLIN/SULBACTAM >=32 RESISTANT Resistant     PIP/TAZO <=4 SENSITIVE Sensitive     Extended ESBL POSITIVE Resistant     * >=100,000 COLONIES/mL ESCHERICHIA COLI    IMAGING: Dg Ribs Unilateral Right  Result Date: 05/15/2017 CLINICAL DATA:  Right-sided rib pain.  History of breast carcinoma EXAM: RIGHT RIBS - 2 VIEW COMPARISON:  Chest radiograph April 16, 2017 and chest CT April 30, 2017 FINDINGS: Frontal and oblique views of right ribs obtained. Note that there is a Port-A-Cath with tip at the cavoatrial junction. No pneumothorax on the right.  Right lung is clear. No right pleural effusion. There is no blastic or lytic bone right rib lesion. No evident right rib fracture. There is marked collapse of the T7 vertebral body as well as wedging of the T8 vertebral body. There appears to be bony destruction involving the T7 vertebral body. IMPRESSION: Metastatic appearing lesion at T7 and questionable metastatic lesion at T8. No metastatic rib lesion evident on the right. No rib fracture evident on the right. Right lung appears clear. Port-A-Cath tip at cavoatrial junction. No right-sided pneumothorax. Electronically Signed   By: Lowella Grip III M.D.   On: 05/15/2017 17:19   Dg Thoracic Spine 2 View  Result Date: 05/22/2017 CLINICAL DATA:  Back pain. EXAM: THORACIC SPINE 2 VIEWS; DG C-ARM 61-120 MIN COMPARISON:  MRI 319. FINDINGS: Intraoperative C-arm radiographs demonstrate vertebral augmentation at T7 and T8. partial restoration of height at T7, RIGHT-sided approach with cement crossing the midline. RIGHT-sided approach at T8, predominantly confined to the RIGHT hemivertebra, with filling of a small paravertebral vein on  the RIGHT. IMPRESSION: T8 and T7 kyphoplasty as described. Electronically Signed   By: Staci Righter M.D.   On: 05/22/2017 18:47   Ct Angio Chest Pe W Or Wo Contrast  Result Date: 04/30/2017 CLINICAL DATA:  Lung carcinoma. RIGHT rib pain. Pain with deep inspiration. COPD. Ten 10 pain EXAM: CT ANGIOGRAPHY CHEST WITH CONTRAST TECHNIQUE: Multidetector CT imaging of the chest was performed using the standard protocol during bolus administration of intravenous contrast. Multiplanar CT image reconstructions and MIPs were obtained to evaluate the vascular anatomy. CONTRAST:  71m OMNIPAQUE IOHEXOL 350 MG/ML SOLN COMPARISON:  01/27/2017, PET-CT, chest CT 11/21/2016 FINDINGS: Cardiovascular: Aneurysmal dilatation of the descending thoracic aorta to 4.2 cm not changed from prior. No central pulmonary embolism. No filling defect within the  peripheral pulmonary arteries to localize pulmonary embolism. Atelectasis of the LEFT lower lobe Port in the anterior chest wall with tip in distal SVC. Mediastinum/Nodes: No axillary supraclavicular adenopathy. No mediastinal hilar adenopathy. No pericardial effusion. Lungs/Pleura: Chronic collapse of the LEFT lower lobe. LEFT effusion unchanged. Severe centrilobular emphysema throughout both lungs. No suspicious nodularity. Upper Abdomen: Limited view of the liver, kidneys, pancreas are unremarkable. Normal adrenal glands. Musculoskeletal: There is a new compression fracture at T7 with approximately 60% loss vertebral body height anteriorly. No retropulsion. This fracture is new from PET-CT scan of 01/27/2017. No metastatic lesion seen at that time. Review of the MIP images confirms the above findings. IMPRESSION: 1. No evidence acute pulmonary embolism. 2. New wedge compression fracture at T7 compared to PET-CT 01/27/2017. No metastatic lesion seen at that level comparison CT. 3. Chronic atelectasis of the LEFT lower lobe effusion. 4. Stable aneurysmal dilatation of the descending thoracic aorta. 5. Severe centrilobular emphysema. These results will be called to the ordering clinician or representative by the Radiologist Assistant, and communication documented in the PACS or zVision Dashboard. Electronically Signed   By: SSuzy BouchardM.D.   On: 04/30/2017 14:57   Mr Thoracic Spine Wo Contrast  Result Date: 05/15/2017 CLINICAL DATA:  Back pain secondary to compression fracture of T7. EXAM: MRI THORACIC SPINE WITHOUT CONTRAST TECHNIQUE: Multiplanar, multisequence MR imaging of the thoracic spine was performed. No intravenous contrast was administered. COMPARISON:  CT scan of the chest dated 04/30/2017 FINDINGS: Alignment: Accentuation of the thoracic kyphosis at T7-8 secondary to the compression fracture of T7. Vertebrae: Benign-appearing subacute compression fracture of T7. Edema extends into the right  pedicle and transverse process of T7. Acute slight compression fracture of the superior endplate of T8. Cord:  Normal signal and morphology. Paraspinal and other soft tissues: Small left pleural effusion. Aneurysmal dilatation of the descending thoracic aorta. Dilated nerve root sleeves bilaterally in the upper thoracic spine, not felt to be significant. Disc levels: C7-T1 through T5-6: No significant abnormality.  Disc desiccation. T6-7: Slight disc desiccation. No disc bulging or protrusion. Subacute severe compression fracture of T7. Minimal protrusion of bone of the posterior inferior aspect of the vertebral body into the spinal canal with no neural impingement. Findings are unchanged since the CT scan of 04/30/2017. T7-8: Normal disc. Mild benign-appearing acute compression fracture of the superior endplate of T8, new since the prior CT scan of 04/30/2017. T8-9 through T12-L1: Diffuse disc desiccation. Small broad-based disc bulge at T10-11 without neural impingement. IMPRESSION: 1. Severe benign-appearing subacute compression fracture of T7 without neural impingement. 2. Acute new mild compression fracture of the superior endplate of T8. 3. Aneurysm of the descending thoracic aorta just above the diaphragm with an adjacent small  left effusion. Electronically Signed   By: Lorriane Shire M.D.   On: 05/15/2017 15:49   Dg Chest Port 1 View  Result Date: 05/21/2017 CLINICAL DATA:  Lung cancer patient. Direct admission from a collagen for cough for several weeks. Right-sided chest pain today. EXAM: PORTABLE CHEST 1 VIEW COMPARISON:  04/16/2017 FINDINGS: Cardiac silhouette is normal size.  No mediastinal or hilar masses. Opacity at the left lung base is similar to the prior studies consistent with a combination of pleural fluid, left lower lobe atelectasis, and dilation of the descending thoracic aorta. Lungs show prominent interstitial markings, but are otherwise clear. No right pleural effusion. No pneumothorax.  Right anterior chest wall Port-A-Cath is stable. Skeletal structures are grossly intact. IMPRESSION: 1. No acute findings when compared to the exam is performed in April 2019. 2. There is left lung base opacity. On a CT dated 04/30/2017, there was left lower lobe atelectasis, left pleural effusion and a dilated descending thoracic aorta accounting for this opacity. Electronically Signed   By: Lajean Manes M.D.   On: 05/21/2017 20:16   Dg C-arm 1-60 Min  Result Date: 05/22/2017 CLINICAL DATA:  Back pain. EXAM: THORACIC SPINE 2 VIEWS; DG C-ARM 61-120 MIN COMPARISON:  MRI 319. FINDINGS: Intraoperative C-arm radiographs demonstrate vertebral augmentation at T7 and T8. partial restoration of height at T7, RIGHT-sided approach with cement crossing the midline. RIGHT-sided approach at T8, predominantly confined to the RIGHT hemivertebra, with filling of a small paravertebral vein on the RIGHT. IMPRESSION: T8 and T7 kyphoplasty as described. Electronically Signed   By: Staci Righter M.D.   On: 05/22/2017 18:47    Assessment:   [name] [name] is a 77 y.o. female  with hx of metastatic squamous cell carcinoma of the left lower lobe with carboplatin, Abraxane, and pembrolizumab admitted with worsening back pain from T spine fracture, and fatigue. She has had otpt UA and UCX growing ESBL E coli and had sputum cx with corynebacterium.  She is currently on levofloxacin. Her WBC is 1.9, ANC 1.0. Imaging of chest shows chronic L base changes.   While she certainly is at risk of infection currently she has no urological infectious sxs. Urology has seen and suggests not treating asymptomatic bacteriuria. She has no obvious GU abnormalities to predispose her to recurrent UTIs.  She also has no progressive pulmonary signs of infection with no fevers, no change in SOB and cough.  She is day 2 of meropenem.   Recommendations At this point I would usually only recommend treatment for her ESBL E coli asymptomatic bacteruria  if her she becomes neutropenic which she currently is not.  That being said she has received 2 days of meropenem so would give her one more day of ertapenem tomorrow and can then dc.  I also would not target the corynebacterium in her sputum unless she develops increased cough, sputum, fevers or changes in her CXR.   Check cbc with diff in am  Overall would give one more dose of ertapenem in AM tomorrow and then dc off abx.  She should monitor for fevers and have her wbc checked next week.   Thank you very much for allowing me to participate in the care of this patient. Please call with questions.   Cheral Marker. Ola Spurr, MD

## 2017-05-23 ENCOUNTER — Encounter: Payer: Self-pay | Admitting: Orthopedic Surgery

## 2017-05-23 DIAGNOSIS — J449 Chronic obstructive pulmonary disease, unspecified: Secondary | ICD-10-CM

## 2017-05-23 LAB — CBC WITH DIFFERENTIAL/PLATELET
Basophils Absolute: 0.1 10*3/uL (ref 0–0.1)
Basophils Relative: 1 %
Eosinophils Absolute: 0 10*3/uL (ref 0–0.7)
Eosinophils Relative: 1 %
HCT: 28.3 % — ABNORMAL LOW (ref 35.0–47.0)
Hemoglobin: 9.5 g/dL — ABNORMAL LOW (ref 12.0–16.0)
Lymphocytes Relative: 16 %
Lymphs Abs: 0.7 10*3/uL — ABNORMAL LOW (ref 1.0–3.6)
MCH: 31.3 pg (ref 26.0–34.0)
MCHC: 33.7 g/dL (ref 32.0–36.0)
MCV: 92.8 fL (ref 80.0–100.0)
Monocytes Absolute: 0.5 10*3/uL (ref 0.2–0.9)
Monocytes Relative: 11 %
Neutro Abs: 3.1 10*3/uL (ref 1.4–6.5)
Neutrophils Relative %: 71 %
Platelets: 137 10*3/uL — ABNORMAL LOW (ref 150–440)
RBC: 3.05 MIL/uL — ABNORMAL LOW (ref 3.80–5.20)
RDW: 16.5 % — ABNORMAL HIGH (ref 11.5–14.5)
WBC: 4.3 10*3/uL (ref 3.6–11.0)

## 2017-05-23 LAB — BASIC METABOLIC PANEL
ANION GAP: 6 (ref 5–15)
BUN: 18 mg/dL (ref 6–20)
CALCIUM: 8 mg/dL — AB (ref 8.9–10.3)
CO2: 27 mmol/L (ref 22–32)
Chloride: 102 mmol/L (ref 101–111)
Creatinine, Ser: 1.03 mg/dL — ABNORMAL HIGH (ref 0.44–1.00)
GFR calc Af Amer: >60 mL/min (ref >60)
GFR, EST NON AFRICAN AMERICAN: 52 mL/min — AB (ref >60)
GLUCOSE: 95 mg/dL (ref 65–99)
Potassium: 4 mmol/L (ref 3.5–5.1)
Sodium: 135 mmol/L (ref 135–145)

## 2017-05-23 LAB — URINE CULTURE

## 2017-05-23 MED ORDER — ALUM & MAG HYDROXIDE-SIMETH 200-200-20 MG/5ML PO SUSP: 30.0000 mL | Freq: Four times a day (QID) | ORAL | Status: DC | PRN

## 2017-05-23 NOTE — Consult Note (Signed)
Reason for Consult: COPD and Lung Cancer Referring Physician:   SHAILY [name] is an 77 y.o. female.  HPI: [name] [name] is a very pleasant 77 year old female with a past medical history remarkable for rheumatoid arthritis congestive heart failure, COPD, hypertension, peripheral neuropathy, history of tobacco use with history of lung cancer, Metastatic squamous cell cancer neuroma of the left lower lobe, treated with concurrent chemoradiation, carboplatin, Abraxane and pembrolizumab.   she is being followed by oncology and is due for an additional treatment on Tuesday.  Unfortunately she developed worsening back pain secondary to T-spine fracture and is status post kyphoplasty along with pain control.  She was recently treated for bronchitis/cough/congestion with Levaquin.  She has been seen by infectious disease has asymptomatic ESBL E. coli bacteriuria and she received 3 days of meropenem for this.  Also she has had Corynebacterium in her sputum which is felt to be a colonizer.  Presently she is asymptomatic sitting on room air without any complaints  Past Medical History:  Diagnosis Date  . Asthma   . Breast cancer ([place]) 2009   left  . Cancer ([place])   . CHF (congestive heart failure) ([place])   . CHF (congestive heart failure) ([place])   . Chronic UTI   . COPD (chronic obstructive pulmonary disease) ([place])   . Dizziness   . Fibromyalgia   . Hypertension   . Neuropathy   . Personal history of tobacco use, presenting hazards to health 05/17/2015  . Polyp, larynx   . RA (rheumatoid arthritis) ([place])   . Sinus infection    recent  . Stumbling gait    to the left  . Supplemental oxygen dependent    2.5l    Past Surgical History:  Procedure Laterality Date  . ABDOMINAL HYSTERECTOMY    . BREAST LUMPECTOMY Left 2009   chemo and radiation  . CYST EXCISION Left 02/27/2015   Procedure: CYST REMOVAL;  Surgeon: Hessie Knows, MD;  Location: [facility] OR[facility];  Service: Orthopedics;  Laterality: Left;  . EYE  MUSCLE SURGERY Right    13 surgeries  . FLEXIBLE BRONCHOSCOPY N/A 07/01/2016   Procedure: FLEXIBLE BRONCHOSCOPY;  Surgeon: Wilhelmina Mcardle, MD;  Location: [facility];  Service: Pulmonary;  Laterality: N/A;  . FLEXIBLE BRONCHOSCOPY N/A 02/09/2017   Procedure: FLEXIBLE BRONCHOSCOPY;  Surgeon: [name], MD;  Location: [facility];  Service: Pulmonary;  Laterality: N/A;  . KYPHOPLASTY N/A 05/22/2017   Procedure: Celine Ahr;  Surgeon: Hessie Knows, MD;  Location: [facility];  Service: Orthopedics;  Laterality: N/A;  . PORTA CATH INSERTION N/A 03/04/2017   Procedure: PORTA CATH INSERTION;  Surgeon: [name], MD;  Location: [facility];  Service: Cardiovascular;  Laterality: N/A;  . THUMB ARTHROSCOPY Left     Family History  Problem Relation Age of Onset  . Diabetes Father   . Stroke Father   . Heart attack Father   . CAD Sister     Social History:  reports that she has been smoking cigarettes.  She has a 40.00 pack-year smoking history. She has never used smokeless tobacco. She reports that she does not drink alcohol or use drugs.  Allergies:  Allergies  Allergen Reactions  . Contrast Media [Iodinated Diagnostic Agents] Other (See Comments)    Pt was sent to the ED following contrast media injection at [place]. Unknown reason. She has been premedicated since without complications. Pt to be premedicated prior to contrast media injections  . Ioxaglate Other (See Comments)  Pt was sent to the ED following contrast media injection at [place]. Unknown reason. She has been premedicated since without complications. Pt to be premedicated prior to contrast media injections  . Sulfa Antibiotics Other (See Comments)    Unknown  . Tetracycline Hives  . White Petrolatum Other (See Comments)    Blisters  . Amoxicillin-Pot Clavulanate Rash and Other (See Comments)    Blisters in mouth Has patient had a PCN reaction causing immediate rash,  facial/tongue/throat swelling, SOB or lightheadedness with hypotension: No Has patient had a PCN reaction causing severe rash involving mucus membranes or skin necrosis: No Has patient had a PCN reaction that required hospitalization: No Has patient had a PCN reaction occurring within the last 10 years: No If all of the above answers are "NO", then may proceed with Cephalosporin use.   . Tape Rash    Medications: I have reviewed the patient's current medications.  Results for orders placed or performed during the hospital encounter of 05/21/17 (from the past 48 hour(s))  Urinalysis, Complete w Microscopic     Status: Abnormal   Collection Time: 05/21/17  9:41 PM  Result Value Ref Range   Color, Urine YELLOW (A) YELLOW   APPearance CLEAR (A) CLEAR   Specific Gravity, Urine 1.008 1.005 - 1.030   pH 5.0 5.0 - 8.0   Glucose, UA NEGATIVE NEGATIVE mg/dL   Hgb urine dipstick NEGATIVE NEGATIVE   Bilirubin Urine NEGATIVE NEGATIVE   Ketones, ur NEGATIVE NEGATIVE mg/dL   Protein, ur NEGATIVE NEGATIVE mg/dL   Nitrite NEGATIVE NEGATIVE   Leukocytes, UA SMALL (A) NEGATIVE   RBC / HPF 0-5 0 - 5 RBC/hpf   WBC, UA 0-5 0 - 5 WBC/hpf   Bacteria, UA NONE SEEN NONE SEEN   Squamous Epithelial / LPF 0-5 0 - 5   Mucus PRESENT    Non Squamous Epithelial 0-5 (A) NONE SEEN    Comment: Performed at [facility], [address] [place], [place] [address]  Urine Culture     Status: Abnormal   Collection Time: 05/21/17  9:41 PM  Result Value Ref Range   Specimen Description      URINE, CLEAN CATCH Performed at [facility], [address] [place], [place] [address]    Special Requests      NONE Performed at [facility], [address], [place], [place] [address]    Culture MULTIPLE SPECIES PRESENT, SUGGEST RECOLLECTION (A)    Report Status 05/23/2017 FINAL   Basic metabolic panel     Status: Abnormal   Collection Time: 05/22/17  5:03 AM  Result Value Ref Range    Sodium 132 (L) 135 - 145 mmol/L   Potassium 3.8 3.5 - 5.1 mmol/L   Chloride 98 (L) 101 - 111 mmol/L   CO2 27 22 - 32 mmol/L   Glucose, Bld 96 65 - 99 mg/dL   BUN 14 6 - 20 mg/dL   Creatinine, Ser 1.04 (H) 0.44 - 1.00 mg/dL   Calcium 8.2 (L) 8.9 - 10.3 mg/dL   GFR calc non Af Amer 52 (L) >60 mL/min   GFR calc Af Amer 60 (L) >60 mL/min    Comment: (NOTE) The eGFR has been calculated using the CKD EPI equation. This calculation has not been validated in all clinical situations. eGFR's persistently <60 mL/min signify possible Chronic Kidney Disease.    Anion gap 7 5 - 15    Comment: Performed at [facility], [address] [place], [place] [address]  CBC     Status: Abnormal   Collection Time: 05/22/17  5:03 AM  Result Value Ref Range   WBC 1.7 (L) 3.6 - 11.0 K/uL   RBC 2.76 (L) 3.80 - 5.20 MIL/uL   Hemoglobin 8.6 (L) 12.0 - 16.0 g/dL   HCT 25.6 (L) 35.0 - 47.0 %   MCV 92.7 80.0 - 100.0 fL   MCH 31.3 26.0 - 34.0 pg   MCHC 33.7 32.0 - 36.0 g/dL   RDW 16.0 (H) 11.5 - 14.5 %   Platelets 110 (L) 150 - 440 K/uL    Comment: Performed at [facility], [address] [place], [place] [address]  Basic metabolic panel     Status: Abnormal   Collection Time: 05/23/17  5:06 AM  Result Value Ref Range   Sodium 135 135 - 145 mmol/L   Potassium 4.0 3.5 - 5.1 mmol/L   Chloride 102 101 - 111 mmol/L   CO2 27 22 - 32 mmol/L   Glucose, Bld 95 65 - 99 mg/dL   BUN 18 6 - 20 mg/dL   Creatinine, Ser 1.03 (H) 0.44 - 1.00 mg/dL   Calcium 8.0 (L) 8.9 - 10.3 mg/dL   GFR calc non Af Amer 52 (L) >60 mL/min   GFR calc Af Amer >60 >60 mL/min    Comment: (NOTE) The eGFR has been calculated using the CKD EPI equation. This calculation has not been validated in all clinical situations. eGFR's persistently <60 mL/min signify possible Chronic Kidney Disease.    Anion gap 6 5 - 15    Comment: Performed at [facility], [address] [place], [place] [address]  CBC with  Differential     Status: Abnormal   Collection Time: 05/23/17  5:06 AM  Result Value Ref Range   WBC 4.3 3.6 - 11.0 K/uL   RBC 3.05 (L) 3.80 - 5.20 MIL/uL   Hemoglobin 9.5 (L) 12.0 - 16.0 g/dL   HCT 28.3 (L) 35.0 - 47.0 %   MCV 92.8 80.0 - 100.0 fL   MCH 31.3 26.0 - 34.0 pg   MCHC 33.7 32.0 - 36.0 g/dL   RDW 16.5 (H) 11.5 - 14.5 %   Platelets 137 (L) 150 - 440 K/uL   Neutrophils Relative % 71 %   Neutro Abs 3.1 1.4 - 6.5 K/uL   Lymphocytes Relative 16 %   Lymphs Abs 0.7 (L) 1.0 - 3.6 K/uL   Monocytes Relative 11 %   Monocytes Absolute 0.5 0.2 - 0.9 K/uL   Eosinophils Relative 1 %   Eosinophils Absolute 0.0 0 - 0.7 K/uL   Basophils Relative 1 %   Basophils Absolute 0.1 0 - 0.1 K/uL    Comment: Performed at [facility], [address] [place], [place] [address]    Dg Thoracic Spine 2 View  Result Date: 05/22/2017 CLINICAL DATA:  Back pain. EXAM: THORACIC SPINE 2 VIEWS; DG C-ARM 61-120 MIN COMPARISON:  MRI 319. FINDINGS: Intraoperative C-arm radiographs demonstrate vertebral augmentation at T7 and T8. partial restoration of height at T7, RIGHT-sided approach with cement crossing the midline. RIGHT-sided approach at T8, predominantly confined to the RIGHT hemivertebra, with filling of a small paravertebral vein on the RIGHT. IMPRESSION: T8 and T7 kyphoplasty as described. Electronically Signed   By: Staci Righter M.D.   On: 05/22/2017 18:47   Dg Chest Port 1 View  Result Date: 05/21/2017 CLINICAL DATA:  Lung cancer patient. Direct admission from a collagen  for cough for several weeks. Right-sided chest pain today. EXAM: PORTABLE CHEST 1 VIEW COMPARISON:  04/16/2017 FINDINGS: Cardiac silhouette is normal size.  No mediastinal or hilar masses. Opacity at the left lung base is similar to the prior studies consistent with a combination of pleural fluid, left lower lobe atelectasis, and dilation of the descending thoracic aorta. Lungs show prominent interstitial markings, but are  otherwise clear. No right pleural effusion. No pneumothorax. Right anterior chest wall Port-A-Cath is stable. Skeletal structures are grossly intact. IMPRESSION: 1. No acute findings when compared to the exam is performed in April 2019. 2. There is left lung base opacity. On a CT dated 04/30/2017, there was left lower lobe atelectasis, left pleural effusion and a dilated descending thoracic aorta accounting for this opacity. Electronically Signed   By: Lajean Manes M.D.   On: 05/21/2017 20:16   Dg C-arm 1-60 Min  Result Date: 05/22/2017 CLINICAL DATA:  Back pain. EXAM: THORACIC SPINE 2 VIEWS; DG C-ARM 61-120 MIN COMPARISON:  MRI 319. FINDINGS: Intraoperative C-arm radiographs demonstrate vertebral augmentation at T7 and T8. partial restoration of height at T7, RIGHT-sided approach with cement crossing the midline. RIGHT-sided approach at T8, predominantly confined to the RIGHT hemivertebra, with filling of a small paravertebral vein on the RIGHT. IMPRESSION: T8 and T7 kyphoplasty as described. Electronically Signed   By: Staci Righter M.D.   On: 05/22/2017 18:47    ROS Blood pressure (!) 159/88, pulse (!) 110, temperature 97.8 F (36.6 C), temperature source Oral, resp. rate 20, height _0  (1.575 m), weight 110 lb 10.7 oz (50.2 kg), SpO2 92 %. Physical Exam  Patient is awake, alert, oriented in no acute distress Vital signs: Please see the above listed vital signs HEENT: Alopecia, no oral lesions appreciated, trachea is midline, no accessory muscle utilization Cardiovascular: Regular rate and rhythm Pulmonary: Clear to auscultation Abdominal: Positive bowel sounds, soft exam Extremities: No clubbing cyanosis or edema noted Neurologic: No focal deficits appreciated  Assessment/Plan:  Patient with history of metastatic squamous cell lung cancer, status post concurrent chemoradiation, being followed by oncology with additional treatment on Tuesday.  She was treated with carboplatin Abraxane  and pembrolizumab.  She is now status post kyphoplasty for T-spine fracture.  She is asymptomatic from a respiratory point of view.  Would continue her COPD inhaler regimen at [facility] and Symbicort.  She is to follow-up with oncology this week.  No additional recommendations from pulmonary standpoint will sign off  Hermelinda Dellen, DO  Walsie Smeltz 05/23/2017, 10:48 AM

## 2017-05-23 NOTE — Evaluation (Addendum)
Physical Therapy Evaluation Patient Details Name: [name] [name] MRN: [id number] DOB: Mar 26, 1940 Today's Date: 05/23/2017   History of Present Illness  77 yo female with onset of back pain that required a kyphoplasty on 5/10 to T7 and T8, now referred to PT.  On contact precautions, light headed when PT arrived off O2 and down to 83% sats.  Contact precautions for UTI, has LLL atelectasis, on 2L O2 normally but now 3L.  PMHx:  breast CA, L lung squamous cell CA, portacath, CHF, HTN, PN, UTI, COPD, chronic respiratory failure  Clinical Impression  Pt was instructed in back precautions but is not currently having any pain.  Has been quite uncomfortable overnight but better managed now.  Follow acutely for gait and transfers, and will anticipate her family will assist her at home with all mobility, and will continue to instruct pt acutely in protecting her spine from poor body mechanics.      Follow Up Recommendations Home health PT;Supervision for mobility/OOB    Equipment Recommendations  None recommended by PT    Recommendations for Other Services       Precautions / Restrictions Precautions Precautions: Fall;Back Precaution Comments: telemetry, ck O2 sats with effort Restrictions Weight Bearing Restrictions: No      Mobility  Bed Mobility Overal bed mobility: Needs Assistance Bed Mobility: Supine to Sit     Supine to sit: Min assist     General bed mobility comments: mainly needs trunk support to sit up to side of bed with reminders about back precautions  Transfers Overall transfer level: Needs assistance Equipment used: Rolling walker (2 wheeled);1 person hand held assist Transfers: Sit to/from Stand Sit to Stand: Min guard;Min assist         General transfer comment: minor help to power up then min guard to steady at walker  Ambulation/Gait Ambulation/Gait assistance: Min assist Ambulation Distance (Feet): 5 Feet Assistive device: Rolling walker (2 wheeled);1  person hand held assist Gait Pattern/deviations: Step-through pattern;Step-to pattern;Decreased stride length;Narrow base of support;Trunk flexed;Shuffle Gait velocity: reduced   General Gait Details: pt was assisted to step through with walker bed to chair but O2 sats were extremely low off O2 as she had removed.  was down to 83% with effort and replaced her O2 with recovery to 93% but then declined again to 85%.    Stairs            Wheelchair Mobility    Modified Rankin (Stroke Patients Only)       Balance Overall balance assessment: Needs assistance Sitting-balance support: Feet supported Sitting balance-Leahy Scale: Good     Standing balance support: Bilateral upper extremity supported Standing balance-Leahy Scale: Fair Standing balance comment: less than fair dynamically                             Pertinent Vitals/Pain Pain Assessment: No/denies pain    Home Living Family/patient expects to be discharged to:: Private residence Living Arrangements: Children Available Help at Discharge: Family;Available 24 hours/day Type of Home: House       Home Layout: One level Home Equipment: [place] - 2 wheels;Cane - single point Additional Comments: was on 2L )2 previously with HHPT    Prior Function Level of Independence: Independent with assistive device(s)         Comments: previously was able to walk with no assist on cane vs walker     Hand Dominance   Dominant Hand: Right  Extremity/Trunk Assessment   Upper Extremity Assessment Upper Extremity Assessment: Overall WFL for tasks assessed    Lower Extremity Assessment Lower Extremity Assessment: Generalized weakness(4+ hips and hamstrings)    Cervical / Trunk Assessment Cervical / Trunk Assessment: Kyphotic  Communication   Communication: No difficulties  Cognition Arousal/Alertness: Awake/alert Behavior During Therapy: WFL for tasks assessed/performed Overall Cognitive Status:  Within Functional Limits for tasks assessed                                        General Comments      Exercises     Assessment/Plan    PT Assessment Patient needs continued PT services  PT Problem List Decreased range of motion;Decreased strength;Decreased activity tolerance;Decreased balance;Decreased mobility;Decreased coordination;Decreased knowledge of use of DME;Decreased safety awareness;Cardiopulmonary status limiting activity       PT Treatment Interventions DME instruction;Gait training;Stair training;Functional mobility training;Therapeutic activities;Therapeutic exercise;Balance training;Neuromuscular re-education;Patient/family education    PT Goals (Current goals can be found in the Care Plan section)  Acute Rehab PT Goals Patient Stated Goal: to get home and get moving a little more PT Goal Formulation: With patient Time For Goal Achievement: 06/06/17 Potential to Achieve Goals: Good    Frequency 7X/week   Barriers to discharge (has reasonable geography at home and husband able to assist ) will need HHPT for follow up    Co-evaluation               AM-PAC PT "6 Clicks" Daily Activity  Outcome Measure Difficulty turning over in bed (including adjusting bedclothes, sheets and blankets)?: A Little Difficulty moving from lying on back to sitting on the side of the bed? : Unable Difficulty sitting down on and standing up from a chair with arms (e.g., wheelchair, bedside commode, etc,.)?: Unable Help needed moving to and from a bed to chair (including a wheelchair)?: A Little Help needed walking in hospital room?: A Little Help needed climbing 3-5 steps with a railing? : A Lot 6 Click Score: 13    End of Session Equipment Utilized During Treatment: Gait belt;Oxygen Activity Tolerance: Patient limited by fatigue;Treatment limited secondary to medical complications (Comment)(requires O2 for more than night use right now) Patient left: in  chair;with call bell/phone within reach;with chair alarm set Nurse Communication: Mobility status PT Visit Diagnosis: Unsteadiness on feet (R26.81);Muscle weakness (generalized) (M62.81);Difficulty in walking, not elsewhere classified (R26.2);Dizziness and giddiness (R42)    Time: 1030-1101 PT Time Calculation (min) (ACUTE ONLY): 31 min   Charges:   PT Evaluation $PT Eval Moderate Complexity: 1 Mod PT Treatments $Gait Training: 8-22 mins   PT G Codes:   PT G-Codes **NOT FOR INPATIENT CLASS** Functional Assessment Tool Used: AM-PAC 6 Clicks Basic Mobility    Ramond Dial 05/23/2017, 1:45 PM   Mee Hives, PT MS Acute Rehab Dept. Number: [place] and [place]

## 2017-05-23 NOTE — Plan of Care (Signed)
Pt worked with PT today and they're recommending home with Home Health.  She's c/o of back pain but less severe than before kyphoplasty. Has required pain medicine 3x.  Pt has been up to BR.  Pt will d/c home tomorrow; dr wanted her to have last dose of meropenem. Chronic use of O2 at 3L.  Periodically takes O2 off.

## 2017-05-23 NOTE — Progress Notes (Signed)
[place] [facility] [place] NAME: Weslyn Holsonback    MR#:  [id number]  DATE OF BIRTH:  1940/04/03  SUBJECTIVE:   Patient directly admitted from oncology's office due to back pain and noted to have a T7 compression fracture.  Patient also noted to have positive urine cultures a week ago for ESBL and was treated with oral Levaquin which is not sensitive to, but patient is clinically asymptomatic. Patient had a kyphoplasty yesterday, she is doing well working with physical therapy.  Denies any complaints no further back pain.  Patient will finish last dose of meropenem tonight and discharge home tomorrow. REVIEW OF SYSTEMS:    Review of Systems  Constitutional: Negative for chills and fever.  HENT: Negative for congestion and tinnitus.   Eyes: Negative for blurred vision and double vision.  Respiratory: Negative for cough, shortness of breath and wheezing.   Cardiovascular: Negative for chest pain, orthopnea and PND.  Gastrointestinal: Negative for abdominal pain, diarrhea, nausea and vomiting.  Genitourinary: Negative for dysuria and hematuria.  Musculoskeletal: Positive for back pain.  Neurological: Negative for dizziness, sensory change and focal weakness.  All other systems reviewed and are negative.   Nutrition: NPO for Kyphoplasty Tolerating Diet: No Tolerating PT: Await Eval.    DRUG ALLERGIES:   Allergies  Allergen Reactions  . Contrast Media [Iodinated Diagnostic Agents] Other (See Comments)    Pt was sent to the ED following contrast media injection at [place]. Unknown reason. She has been premedicated since without complications. Pt to be premedicated prior to contrast media injections  . Ioxaglate Other (See Comments)    Pt was sent to the ED following contrast media injection at [place]. Unknown reason. She has been premedicated since without complications. Pt to be premedicated prior to contrast media injections   . Sulfa Antibiotics Other (See Comments)    Unknown  . Tetracycline Hives  . White Petrolatum Other (See Comments)    Blisters  . Amoxicillin-Pot Clavulanate Rash and Other (See Comments)    Blisters in mouth Has patient had a PCN reaction causing immediate rash, facial/tongue/throat swelling, SOB or lightheadedness with hypotension: No Has patient had a PCN reaction causing severe rash involving mucus membranes or skin necrosis: No Has patient had a PCN reaction that required hospitalization: No Has patient had a PCN reaction occurring within the last 10 years: No If all of the above answers are "NO", then may proceed with Cephalosporin use.   . Tape Rash    VITALS:  Blood pressure 96/76, pulse 90, temperature 97.8 F (36.6 C), temperature source Oral, resp. rate 20, height 5\' 2"  (1.575 m), weight 50.2 kg (110 lb 10.7 oz), SpO2 92 %.  PHYSICAL EXAMINATION:   Physical Exam  GENERAL:  77 y.o.-year-old patient lying in bed in no acute distress.  EYES: Pupils equal, round, reactive to light and accommodation. No scleral icterus. Extraocular muscles intact.  HEENT: Head atraumatic, normocephalic. Oropharynx and nasopharynx clear.  NECK:  Supple, no jugular venous distention. No thyroid enlargement, no tenderness.  LUNGS: Normal breath sounds bilaterally, no wheezing, rales, rhonchi. No use of accessory muscles of respiration.  CARDIOVASCULAR: S1, S2 normal. No murmurs, rubs, or gallops.  ABDOMEN: Soft, nontender, nondistended. Bowel sounds present. No organomegaly or mass.  EXTREMITIES: No cyanosis, clubbing or edema b/l.    NEUROLOGIC: Cranial nerves II through XII are intact. No focal Motor or sensory deficits b/l. Globally weak.    PSYCHIATRIC: The patient is alert  and oriented x 3.  SKIN: No obvious rash, lesion, or ulcer.    LABORATORY PANEL:   CBC Recent Labs  Lab 05/23/17 0506  WBC 4.3  HGB 9.5*  HCT 28.3*  PLT 137*    ------------------------------------------------------------------------------------------------------------------  Chemistries  Recent Labs  Lab 05/23/17 0506  NA 135  K 4.0  CL 102  CO2 27  GLUCOSE 95  BUN 18  CREATININE 1.03*  CALCIUM 8.0*   ------------------------------------------------------------------------------------------------------------------  Cardiac Enzymes No results for input(s): TROPONINI in the last 168 hours. ------------------------------------------------------------------------------------------------------------------  RADIOLOGY:  Dg Thoracic Spine 2 View  Result Date: 05/22/2017 CLINICAL DATA:  Back pain. EXAM: THORACIC SPINE 2 VIEWS; DG C-ARM 61-120 MIN COMPARISON:  MRI 319. FINDINGS: Intraoperative C-arm radiographs demonstrate vertebral augmentation at T7 and T8. partial restoration of height at T7, RIGHT-sided approach with cement crossing the midline. RIGHT-sided approach at T8, predominantly confined to the RIGHT hemivertebra, with filling of a small paravertebral vein on the RIGHT. IMPRESSION: T8 and T7 kyphoplasty as described. Electronically Signed   By: Staci Righter M.D.   On: 05/22/2017 18:47   Dg Chest Port 1 View  Result Date: 05/21/2017 CLINICAL DATA:  Lung cancer patient. Direct admission from a collagen for cough for several weeks. Right-sided chest pain today. EXAM: PORTABLE CHEST 1 VIEW COMPARISON:  04/16/2017 FINDINGS: Cardiac silhouette is normal size.  No mediastinal or hilar masses. Opacity at the left lung base is similar to the prior studies consistent with a combination of pleural fluid, left lower lobe atelectasis, and dilation of the descending thoracic aorta. Lungs show prominent interstitial markings, but are otherwise clear. No right pleural effusion. No pneumothorax. Right anterior chest wall Port-A-Cath is stable. Skeletal structures are grossly intact. IMPRESSION: 1. No acute findings when compared to the exam is performed in  April 2019. 2. There is left lung base opacity. On a CT dated 04/30/2017, there was left lower lobe atelectasis, left pleural effusion and a dilated descending thoracic aorta accounting for this opacity. Electronically Signed   By: Lajean Manes M.D.   On: 05/21/2017 20:16   Dg C-arm 1-60 Min  Result Date: 05/22/2017 CLINICAL DATA:  Back pain. EXAM: THORACIC SPINE 2 VIEWS; DG C-ARM 61-120 MIN COMPARISON:  MRI 319. FINDINGS: Intraoperative C-arm radiographs demonstrate vertebral augmentation at T7 and T8. partial restoration of height at T7, RIGHT-sided approach with cement crossing the midline. RIGHT-sided approach at T8, predominantly confined to the RIGHT hemivertebra, with filling of a small paravertebral vein on the RIGHT. IMPRESSION: T8 and T7 kyphoplasty as described. Electronically Signed   By: Staci Righter M.D.   On: 05/22/2017 18:47     ASSESSMENT AND PLAN:   77 year old female with past medical history of lung cancer with metastatic disease, hypertension, fibromyalgia, rheumatoid arthritis, history of breast cancer, CHF who presented to the hospital due to back pain and noted to have a T7 compression fracture.  1.  Back pain/T7 compression fracture- this is from metastatic lung cancer. - Status post kyphoplasty yesterday.  Tolerating physical therapy and denies any further back pain..  Fentanyl patch, Norco for pain control.  2.  Symptomatic ESBL E. coli bacteremia, patient needs total 3 days of meropenem and 2 days the last day for meropenem.  Discharge home tomorrow. 3.  Lung cancer with metastatic disease-patient is followed by Dr. Mike Gip.  Currently undergoing treatment. -Continue further care as per them.  4.  COPD-no acute exacerbation, continue Spiriva, duo nebs as needed.  5.  Essential hypertension-continue carvedilol.  6.  Anxiety-continue Klonopin. Discharge home in a.m.   All the records are reviewed and case discussed with Care Management/Social  Worker. Management plans discussed with the patient, family and they are in agreement.  CODE STATUS: Full code  DVT Prophylaxis: Ted's and SCD's.   TOTAL TIME TAKING CARE OF THIS PATIENT: 30 minutes.    Epifanio Lesches M.D on 05/23/2017 at 12:01 PM  Between 7am to 6pm - Pager - [phone]  After 6pm go to [email or web address] - Proofreader  [facility]  Office  [phone]  CC: Primary care physician; Tracie Harrier, MD

## 2017-05-24 LAB — CBC WITH DIFFERENTIAL/PLATELET
Basophils Absolute: 0 10*3/uL (ref 0–0.1)
Basophils Relative: 1 %
Eosinophils Absolute: 0.1 10*3/uL (ref 0–0.7)
Eosinophils Relative: 1 %
HCT: 29.5 % — ABNORMAL LOW (ref 35.0–47.0)
Hemoglobin: 10.1 g/dL — ABNORMAL LOW (ref 12.0–16.0)
Lymphocytes Relative: 13 %
Lymphs Abs: 1.2 10*3/uL (ref 1.0–3.6)
MCH: 31.8 pg (ref 26.0–34.0)
MCHC: 34.1 g/dL (ref 32.0–36.0)
MCV: 93.2 fL (ref 80.0–100.0)
Monocytes Absolute: 0.7 10*3/uL (ref 0.2–0.9)
Monocytes Relative: 7 %
Neutro Abs: 7.7 10*3/uL — ABNORMAL HIGH (ref 1.4–6.5)
Neutrophils Relative %: 78 %
Platelets: 147 10*3/uL — ABNORMAL LOW (ref 150–440)
RBC: 3.17 MIL/uL — ABNORMAL LOW (ref 3.80–5.20)
RDW: 16.5 % — ABNORMAL HIGH (ref 11.5–14.5)
WBC: 9.7 10*3/uL (ref 3.6–11.0)

## 2017-05-24 MED ORDER — HEPARIN SOD (PORK) LOCK FLUSH 100 UNIT/ML IV SOLN
500.0000 [IU] | Freq: Once | INTRAVENOUS | Status: DC
  Filled 2017-05-24: qty 5

## 2017-05-24 MED ORDER — HYDROCODONE-ACETAMINOPHEN 5-325 MG PO TABS: 1.0000 | ORAL_TABLET | Freq: Two times a day (BID) | ORAL | 0 refills | Status: DC

## 2017-05-24 NOTE — Progress Notes (Signed)
Discharge instructions along with home medications and follow up gone over with patient and son. Both verbalize that they understood instructions. 1 prescription given to patient. Port deaccessed and tele removed. Pt being discharged home on room air, no distress noted. Ammie Dalton, RN

## 2017-05-24 NOTE — Care Management (Signed)
Patient admitted from home with back pain.  Patient lives at home with her son.  Patient has chronic O2 through [place].  Patient has a RW for ambulation.  Obtains her medications from [facility], and denies any issues obtaining medications.  PT has assessed patient and recommends home health PT.  Patient declines all home health services at discharge.  Patient informed that should she require services after discharge they can be arranged through her PCP [name].

## 2017-05-25 ENCOUNTER — Telehealth: Payer: Self-pay | Admitting: Urology

## 2017-05-25 NOTE — Telephone Encounter (Signed)
-----   Message from Festus Aloe, MD sent at 05/21/2017 11:34 PM EDT ----- This pt needs f/u for microscopic hematuria in the next 2-4 weeks. Thanks.

## 2017-05-25 NOTE — Telephone Encounter (Signed)
Patient already has a new patient app with Larene [facility] on 06-11-17. Just kept same app and updated app [facility]

## 2017-05-26 LAB — SURGICAL PATHOLOGY

## 2017-05-28 ENCOUNTER — Inpatient Hospital Stay: Payer: Medicare HMO

## 2017-05-28 ENCOUNTER — Ambulatory Visit: Payer: Medicare HMO

## 2017-05-28 LAB — SUSCEPTIBILITY, AER + ANAEROB

## 2017-05-28 MED ORDER — CYANOCOBALAMIN 1000 MCG/ML IJ SOLN: 1000.0000 ug | Freq: Once | INTRAMUSCULAR | Status: AC

## 2017-05-29 LAB — URINE CULTURE

## 2017-05-30 NOTE — Discharge Summary (Signed)
[name] [name], is a 77 y.o. female  DOB May 13, 1940  MRN [id number].  Admission date:  05/21/2017  Admitting Physician  Demetrios Loll, MD  Discharge Date:  05/24/2017   Primary MD  Tracie Harrier, MD  Recommendations for primary care physician for things to follow:   Follow with PCP in 1 week  Admission Diagnosis  UTI, Resp infection, intractable pain    Discharge Diagnosis  UTI, Resp infection, intractable pain    Active Problems:   UTI due to extended-spectrum beta lactamase (ESBL) producing Escherichia coli      Past Medical History:  Diagnosis Date  . Asthma   . Breast cancer ([place]) 2009   left  . Cancer ([place])   . CHF (congestive heart failure) ([place])   . CHF (congestive heart failure) ([place])   . Chronic UTI   . COPD (chronic obstructive pulmonary disease) ([place])   . Dizziness   . Fibromyalgia   . Hypertension   . Neuropathy   . Personal history of tobacco use, presenting hazards to health 05/17/2015  . Polyp, larynx   . RA (rheumatoid arthritis) ([place])   . Sinus infection    recent  . Stumbling gait    to the left  . Supplemental oxygen dependent    2.5l    Past Surgical History:  Procedure Laterality Date  . ABDOMINAL HYSTERECTOMY    . BREAST LUMPECTOMY Left 2009   chemo and radiation  . CYST EXCISION Left 02/27/2015   Procedure: CYST REMOVAL;  Surgeon: Hessie Knows, MD;  Location: [facility] ORS;  Service: Orthopedics;  Laterality: Left;  . EYE MUSCLE SURGERY Right    13 surgeries  . FLEXIBLE BRONCHOSCOPY N/A 07/01/2016   Procedure: FLEXIBLE BRONCHOSCOPY;  Surgeon: Wilhelmina Mcardle, MD;  Location: [facility] OR[facility];  Service: Pulmonary;  Laterality: N/A;  . FLEXIBLE BRONCHOSCOPY N/A 02/09/2017   Procedure: FLEXIBLE BRONCHOSCOPY;  Surgeon: [name], MD;  Location: [facility] ORS;  Service: Pulmonary;   Laterality: N/A;  . KYPHOPLASTY N/A 05/22/2017   Procedure: Celine Ahr;  Surgeon: Hessie Knows, MD;  Location: [facility] OR[facility];  Service: Orthopedics;  Laterality: N/A;  . PORTA CATH INSERTION N/A 03/04/2017   Procedure: PORTA CATH INSERTION;  Surgeon: [name], MD;  Location: [facility];  Service: Cardiovascular;  Laterality: N/A;  . THUMB ARTHROSCOPY Left        History of present illness and  Hospital Course:     Kindly see H&P for history of present illness and admission details, please review complete Labs, Consult reports and Test reports for all details in brief  HPI  from the history and physical done on the day of admission 77 year old female patient admitted from oncology office secondary to back pain found to have T7 compression fracture patient also found to have UTI.   Hospital Course   Acute low back pain secondary to T7 compression fracture, admitted to medical service, patient received IV pain medicines, seen by orthopedic physician Dr. Rudene Christians, had a kyphoplasty on May 10, postoperatively patient had minimal back pain.  Discharged home with Norco 5 x 325 1 tablet p.o. twice daily #2 metastatic lung cancer, followed by oncology. 3.  History of COPD without any wheezing this time continue Spiriva, DuoNeb's at home. 4.  History of essential hypertension: Continue Coreg 5.  ASymptomatic ESBL UTI, patient received 3 days of IV meropenem, seen by Dr. Ola Spurr, he did not recommend any  Antibiotics at  discharge.  Discharge Condition: Stable   Follow  UP  Follow-up Information    Tracie Harrier, MD Follow up in 1 week(s).   Specialty:  Internal Medicine Contact information: [place] [address] [phone]             Discharge Instructions  and  Discharge Medications     Allergies as of 05/24/2017      Reactions   Contrast Media [iodinated Diagnostic Agents] Other (See Comments)   Pt was sent to the ED following  contrast media injection at [place]. Unknown reason. She has been premedicated since without complications. Pt to be premedicated prior to contrast media injections   Ioxaglate Other (See Comments)   Pt was sent to the ED following contrast media injection at [place]. Unknown reason. She has been premedicated since without complications. Pt to be premedicated prior to contrast media injections   Sulfa Antibiotics Other (See Comments)   Unknown   Tetracycline Hives   White Petrolatum Other (See Comments)   Blisters   Amoxicillin-pot Clavulanate Rash, Other (See Comments)   Blisters in mouth Has patient had a PCN reaction causing immediate rash, facial/tongue/throat swelling, SOB or lightheadedness with hypotension: No Has patient had a PCN reaction causing severe rash involving mucus membranes or skin necrosis: No Has patient had a PCN reaction that required hospitalization: No Has patient had a PCN reaction occurring within the last 10 years: No If all of the above answers are "NO", then may proceed with Cephalosporin use.   Tape Rash      Medication List    STOP taking these medications   fentaNYL 12 MCG/HR Commonly known as:  DURAGESIC - dosed mcg/hr   levofloxacin 500 MG tablet Commonly known as:  LEVAQUIN     TAKE these medications   albuterol 108 (90 Base) MCG/ACT inhaler Commonly known as:  PROVENTIL HFA;VENTOLIN HFA Inhale 1-2 puffs into the lungs every 6 (six) hours as needed for wheezing or shortness of breath.   Calcium-Vitamin D 600-200 MG-UNIT tablet Take 1 tablet by mouth daily.   carvedilol 12.5 MG tablet Commonly known as:  COREG Take 1 tablet (12.5 mg total) by mouth 2 (two) times daily with a meal.   cholecalciferol 1000 units tablet Commonly known as:  VITAMIN D Take 1,000 Units by mouth daily.   clonazePAM 1 MG tablet Commonly known as:  KLONOPIN Take 1 mg by mouth 2 (two) times daily as needed for anxiety.   dexamethasone 4 MG  tablet Commonly known as:  DECADRON Take 2 tablets by mouth once a day on the day after chemotherapy and then take 2 tablets two times a day for 2 days. Take with food.   gabapentin 600 MG tablet Commonly known as:  NEURONTIN Take 600 mg by mouth 3 (three) times daily.   HYDROcodone-acetaminophen 5-325 MG tablet Commonly known as:  NORCO/VICODIN Take 1 tablet by mouth 2 (two) times daily.   imipramine 25 MG tablet Commonly known as:  TOFRANIL Take 25 mg by mouth at bedtime.   lidocaine 5 % Commonly known as:  LIDODERM Place 1 patch onto the skin daily. Remove & Discard patch within 12 hours or as directed by MD   lidocaine-prilocaine cream Commonly known as:  EMLA Apply 1 application topically as needed.   LORazepam 0.5 MG tablet Commonly known as:  ATIVAN Take 1 tablet (0.5 mg total) by mouth every 6 (six) hours as needed (Nausea or vomiting).   multivitamin capsule Take 1 capsule by mouth daily.   NITROSTAT  0.4 MG SL tablet Generic drug:  nitroGLYCERIN Place 0.4 mg under the tongue every 5 (five) minutes as needed for chest pain.   ondansetron 8 MG tablet Commonly known as:  ZOFRAN Take 1 tablet (8 mg total) by mouth 2 (two) times daily as needed. Start on the third day after chemotherapy.   oxyCODONE 5 MG immediate release tablet Commonly known as:  Oxy IR/ROXICODONE Take 1 tablet (5 mg total) by mouth every 6 (six) hours as needed for severe pain.   Tiotropium Bromide Monohydrate 2.5 MCG/ACT Aers Commonly known as:  SPIRIVA RESPIMAT Inhale 2.5 mcg into the lungs 2 (two) times daily.   traMADol 50 MG tablet Commonly known as:  ULTRAM Take 100 mg by mouth daily.   vitamin C 500 MG tablet Commonly known as:  ASCORBIC ACID Take 500 mg by mouth daily.   ZINC SULFATE PO Take 1 tablet by mouth daily.   zolpidem 5 MG tablet Commonly known as:  AMBIEN Take 5 mg by mouth at bedtime.         Diet and Activity recommendation: See Discharge Instructions  above   Consults obtained -orthopedic, physical therapy   Major procedures and Radiology Reports - PLEASE review detailed and final reports for all details, in brief -   Discharged home with home health physical therapy Has chronic oxygen through [employer]  Dg Ribs Unilateral Right  Result Date: 05/15/2017 CLINICAL DATA:  Right-sided rib pain.  History of breast carcinoma EXAM: RIGHT RIBS - 2 VIEW COMPARISON:  Chest radiograph April 16, 2017 and chest CT April 30, 2017 FINDINGS: Frontal and oblique views of right ribs obtained. Note that there is a Port-A-Cath with tip at the cavoatrial junction. No pneumothorax on the right. Right lung is clear. No right pleural effusion. There is no blastic or lytic bone right rib lesion. No evident right rib fracture. There is marked collapse of the T7 vertebral body as well as wedging of the T8 vertebral body. There appears to be bony destruction involving the T7 vertebral body. IMPRESSION: Metastatic appearing lesion at T7 and questionable metastatic lesion at T8. No metastatic rib lesion evident on the right. No rib fracture evident on the right. Right lung appears clear. Port-A-Cath tip at cavoatrial junction. No right-sided pneumothorax. Electronically Signed   By: Lowella Grip III M.D.   On: 05/15/2017 17:19   Dg Thoracic Spine 2 View  Result Date: 05/22/2017 CLINICAL DATA:  Back pain. EXAM: THORACIC SPINE 2 VIEWS; DG C-ARM 61-120 MIN COMPARISON:  MRI 319. FINDINGS: Intraoperative C-arm radiographs demonstrate vertebral augmentation at T7 and T8. partial restoration of height at T7, RIGHT-sided approach with cement crossing the midline. RIGHT-sided approach at T8, predominantly confined to the RIGHT hemivertebra, with filling of a small paravertebral vein on the RIGHT. IMPRESSION: T8 and T7 kyphoplasty as described. Electronically Signed   By: Staci Righter M.D.   On: 05/22/2017 18:47   Ct Angio Chest Pe W Or Wo Contrast  Result Date: 04/30/2017 CLINICAL  DATA:  Lung carcinoma. RIGHT rib pain. Pain with deep inspiration. COPD. Ten 10 pain EXAM: CT ANGIOGRAPHY CHEST WITH CONTRAST TECHNIQUE: Multidetector CT imaging of the chest was performed using the standard protocol during bolus administration of intravenous contrast. Multiplanar CT image reconstructions and MIPs were obtained to evaluate the vascular anatomy. CONTRAST:  90m OMNIPAQUE IOHEXOL 350 MG/ML SOLN COMPARISON:  01/27/2017, PET-CT, chest CT 11/21/2016 FINDINGS: Cardiovascular: Aneurysmal dilatation of the descending thoracic aorta to 4.2 cm not changed from prior. No central  pulmonary embolism. No filling defect within the peripheral pulmonary arteries to localize pulmonary embolism. Atelectasis of the LEFT lower lobe Port in the anterior chest wall with tip in distal SVC. Mediastinum/Nodes: No axillary supraclavicular adenopathy. No mediastinal hilar adenopathy. No pericardial effusion. Lungs/Pleura: Chronic collapse of the LEFT lower lobe. LEFT effusion unchanged. Severe centrilobular emphysema throughout both lungs. No suspicious nodularity. Upper Abdomen: Limited view of the liver, kidneys, pancreas are unremarkable. Normal adrenal glands. Musculoskeletal: There is a new compression fracture at T7 with approximately 60% loss vertebral body height anteriorly. No retropulsion. This fracture is new from PET-CT scan of 01/27/2017. No metastatic lesion seen at that time. Review of the MIP images confirms the above findings. IMPRESSION: 1. No evidence acute pulmonary embolism. 2. New wedge compression fracture at T7 compared to PET-CT 01/27/2017. No metastatic lesion seen at that level comparison CT. 3. Chronic atelectasis of the LEFT lower lobe effusion. 4. Stable aneurysmal dilatation of the descending thoracic aorta. 5. Severe centrilobular emphysema. These results will be called to the ordering clinician or representative by the Radiologist Assistant, and communication documented in the PACS or zVision  Dashboard. Electronically Signed   By: Suzy Bouchard M.D.   On: 04/30/2017 14:57   Mr Thoracic Spine Wo Contrast  Result Date: 05/15/2017 CLINICAL DATA:  Back pain secondary to compression fracture of T7. EXAM: MRI THORACIC SPINE WITHOUT CONTRAST TECHNIQUE: Multiplanar, multisequence MR imaging of the thoracic spine was performed. No intravenous contrast was administered. COMPARISON:  CT scan of the chest dated 04/30/2017 FINDINGS: Alignment: Accentuation of the thoracic kyphosis at T7-8 secondary to the compression fracture of T7. Vertebrae: Benign-appearing subacute compression fracture of T7. Edema extends into the right pedicle and transverse process of T7. Acute slight compression fracture of the superior endplate of T8. Cord:  Normal signal and morphology. Paraspinal and other soft tissues: Small left pleural effusion. Aneurysmal dilatation of the descending thoracic aorta. Dilated nerve root sleeves bilaterally in the upper thoracic spine, not felt to be significant. Disc levels: C7-T1 through T5-6: No significant abnormality.  Disc desiccation. T6-7: Slight disc desiccation. No disc bulging or protrusion. Subacute severe compression fracture of T7. Minimal protrusion of bone of the posterior inferior aspect of the vertebral body into the spinal canal with no neural impingement. Findings are unchanged since the CT scan of 04/30/2017. T7-8: Normal disc. Mild benign-appearing acute compression fracture of the superior endplate of T8, new since the prior CT scan of 04/30/2017. T8-9 through T12-L1: Diffuse disc desiccation. Small broad-based disc bulge at T10-11 without neural impingement. IMPRESSION: 1. Severe benign-appearing subacute compression fracture of T7 without neural impingement. 2. Acute new mild compression fracture of the superior endplate of T8. 3. Aneurysm of the descending thoracic aorta just above the diaphragm with an adjacent small left effusion. Electronically Signed   By: Lorriane Shire M.D.   On: 05/15/2017 15:49   Dg Chest Port 1 View  Result Date: 05/21/2017 CLINICAL DATA:  Lung cancer patient. Direct admission from a collagen for cough for several weeks. Right-sided chest pain today. EXAM: PORTABLE CHEST 1 VIEW COMPARISON:  04/16/2017 FINDINGS: Cardiac silhouette is normal size.  No mediastinal or hilar masses. Opacity at the left lung base is similar to the prior studies consistent with a combination of pleural fluid, left lower lobe atelectasis, and dilation of the descending thoracic aorta. Lungs show prominent interstitial markings, but are otherwise clear. No right pleural effusion. No pneumothorax. Right anterior chest wall Port-A-Cath is stable. Skeletal structures are grossly intact.  IMPRESSION: 1. No acute findings when compared to the exam is performed in April 2019. 2. There is left lung base opacity. On a CT dated 04/30/2017, there was left lower lobe atelectasis, left pleural effusion and a dilated descending thoracic aorta accounting for this opacity. Electronically Signed   By: Lajean Manes M.D.   On: 05/21/2017 20:16   Dg C-arm 1-60 Min  Result Date: 05/22/2017 CLINICAL DATA:  Back pain. EXAM: THORACIC SPINE 2 VIEWS; DG C-ARM 61-120 MIN COMPARISON:  MRI 319. FINDINGS: Intraoperative C-arm radiographs demonstrate vertebral augmentation at T7 and T8. partial restoration of height at T7, RIGHT-sided approach with cement crossing the midline. RIGHT-sided approach at T8, predominantly confined to the RIGHT hemivertebra, with filling of a small paravertebral vein on the RIGHT. IMPRESSION: T8 and T7 kyphoplasty as described. Electronically Signed   By: Staci Righter M.D.   On: 05/22/2017 18:47    Micro Results    Recent Results (from the past 240 hour(s))  Urine Culture     Status: Abnormal   Collection Time: 05/21/17  9:41 PM  Result Value Ref Range Status   Specimen Description   Final    URINE, CLEAN CATCH Performed at [facility], [address] [place], [place] [address]    Special Requests   Final    NONE Performed at [facility], [address] [place], [place] [address]    Culture MULTIPLE SPECIES PRESENT, SUGGEST RECOLLECTION (A)  Final   Report Status 05/23/2017 FINAL  Final       Today   Subjective:   [name] [name] today has no headache,no chest abdominal pain,no new weakness tingling or numbness, feels much better wants to go home today.   Objective:   Blood pressure (!) 136/58, pulse 87, temperature 97.8 F (36.6 C), temperature source Oral, resp. rate (!) 21, height _0  (1.575 m), weight 50.2 kg (110 lb 10.7 oz), SpO2 (!) 88 %.  No intake or output data in the 24 hours ending 05/30/17 1246  Exam Awake Alert, Oriented x 3, No new F.N deficits, Normal affect [place].AT,PERRAL Supple Neck,No JVD, No cervical lymphadenopathy appriciated.  Symmetrical Chest wall movement, Good air movement bilaterally, CTAB RRR,No Gallops,Rubs or new Murmurs, No Parasternal Heave +ve B.Sounds, Abd Soft, Non tender, No organomegaly appriciated, No rebound -guarding or rigidity. No Cyanosis, Clubbing or edema, No new Rash or bruise  Data Review   CBC w Diff:  Lab Results  Component Value Date   WBC 9.7 05/24/2017   HGB 10.1 (L) 05/24/2017   HGB 14.0 03/09/2013   HCT 29.5 (L) 05/24/2017   HCT 42.4 03/09/2013   PLT 147 (L) 05/24/2017   PLT 165 03/09/2013   LYMPHOPCT 13 05/24/2017   LYMPHOPCT 30.1 03/09/2013   MONOPCT 7 05/24/2017   MONOPCT 6.1 03/09/2013   EOSPCT 1 05/24/2017   EOSPCT 2.5 03/09/2013   BASOPCT 1 05/24/2017   BASOPCT 1.2 03/09/2013    CMP:  Lab Results  Component Value Date   NA 135 05/23/2017   NA 135 (L) 03/09/2013   K 4.0 05/23/2017   K 3.9 03/09/2013   CL 102 05/23/2017   CL 97 (L) 03/09/2013   CO2 27 05/23/2017   CO2 29 03/09/2013   BUN 18 05/23/2017   BUN 7 03/09/2013   CREATININE 1.03 (H) 05/23/2017   CREATININE 1.03 03/09/2013   PROT 6.9 05/15/2017   PROT 8.0  03/09/2013   ALBUMIN 3.4 (L) 05/15/2017   ALBUMIN 3.8 03/09/2013  BILITOT 0.7 05/15/2017   BILITOT 0.3 03/09/2013   ALKPHOS 109 05/15/2017   ALKPHOS 125 (H) 03/09/2013   AST 19 05/15/2017   AST 19 03/09/2013   ALT 14 05/15/2017   ALT 18 03/09/2013  .   Total Time in preparing paper work, data evaluation and todays exam - 35 minutes  Epifanio Lesches M.D on 05/24/2017 at 12:46 PM    Note: This dictation was prepared with Dragon dictation along with smaller phrase technology. Any transcriptional errors that result from this process are unintentional.

## 2017-06-02 ENCOUNTER — Other Ambulatory Visit: Payer: Self-pay | Admitting: Urgent Care

## 2017-06-02 ENCOUNTER — Telehealth: Payer: Self-pay | Admitting: *Deleted

## 2017-06-02 DIAGNOSIS — R0781 Pleurodynia: Secondary | ICD-10-CM

## 2017-06-02 DIAGNOSIS — R918 Other nonspecific abnormal finding of lung field: Secondary | ICD-10-CM

## 2017-06-02 NOTE — Telephone Encounter (Signed)
Patient called asking to be seen by Dr Mike Gip because she is having pain in her ribs. I was going to schedule her to see NP in [facility], but noticed that she does not have follow up appointment with Dr Mike Gip. I discussed this with Dr Humberto Seals and Honor Loh, NP and they are going to order a bone scan and follow up after bone scan hopefully this week if insurance will approve. Patient informed of plan and to expect a call with appts. She is in agreement with this plan

## 2017-06-02 NOTE — Telephone Encounter (Signed)
Per Gaspar Bidding 06/02/17 staff message: Patient needs a STAT bone scan, with follow up in clinic this week.  I called Patient and made her aware of the Bone Scan that was ordered for her. It's scheduled for 06/04/17 @ 9:00 and 12:00 she is aware of date and time of her appts.

## 2017-06-04 ENCOUNTER — Encounter
Admission: RE | Admit: 2017-06-04 | Discharge: 2017-06-04 | Disposition: A | Discharge status: Home / Self Care | Payer: Medicare HMO | Source: Ambulatory Visit | Attending: Urgent Care | Admitting: Urgent Care

## 2017-06-04 ENCOUNTER — Encounter: Payer: Self-pay | Admitting: Hematology and Oncology

## 2017-06-04 ENCOUNTER — Inpatient Hospital Stay: Payer: Medicare HMO

## 2017-06-04 ENCOUNTER — Inpatient Hospital Stay (HOSPITAL_BASED_OUTPATIENT_CLINIC_OR_DEPARTMENT_OTHER): Payer: Medicare HMO | Admitting: Hematology and Oncology

## 2017-06-04 ENCOUNTER — Encounter: Payer: Self-pay | Admitting: Radiation Oncology

## 2017-06-04 ENCOUNTER — Ambulatory Visit
Admission: RE | Admit: 2017-06-04 | Discharge: 2017-06-04 | Disposition: A | Discharge status: Home / Self Care | Payer: Medicare HMO | Source: Ambulatory Visit | Attending: Radiation Oncology | Admitting: Radiation Oncology

## 2017-06-04 ENCOUNTER — Other Ambulatory Visit: Payer: Self-pay

## 2017-06-04 VITALS — BP 124/76 | HR 97 | Temp 97.6°F | Resp 18 | Wt 112.0 lb

## 2017-06-04 VITALS — BP 124/76 | HR 97 | Resp 18 | Wt 113.3 lb

## 2017-06-04 DIAGNOSIS — Z853 Personal history of malignant neoplasm of breast: Secondary | ICD-10-CM

## 2017-06-04 DIAGNOSIS — R350 Frequency of micturition: Secondary | ICD-10-CM | POA: Diagnosis not present

## 2017-06-04 DIAGNOSIS — M549 Dorsalgia, unspecified: Secondary | ICD-10-CM | POA: Diagnosis not present

## 2017-06-04 DIAGNOSIS — Z9981 Dependence on supplemental oxygen: Secondary | ICD-10-CM | POA: Diagnosis not present

## 2017-06-04 DIAGNOSIS — C50912 Malignant neoplasm of unspecified site of left female breast: Secondary | ICD-10-CM | POA: Diagnosis not present

## 2017-06-04 DIAGNOSIS — Z79899 Other long term (current) drug therapy: Secondary | ICD-10-CM | POA: Diagnosis not present

## 2017-06-04 DIAGNOSIS — I509 Heart failure, unspecified: Secondary | ICD-10-CM | POA: Diagnosis not present

## 2017-06-04 DIAGNOSIS — M069 Rheumatoid arthritis, unspecified: Secondary | ICD-10-CM | POA: Diagnosis not present

## 2017-06-04 DIAGNOSIS — F17210 Nicotine dependence, cigarettes, uncomplicated: Secondary | ICD-10-CM | POA: Diagnosis not present

## 2017-06-04 DIAGNOSIS — I110 Hypertensive heart disease with heart failure: Secondary | ICD-10-CM | POA: Diagnosis not present

## 2017-06-04 DIAGNOSIS — R918 Other nonspecific abnormal finding of lung field: Secondary | ICD-10-CM | POA: Insufficient documentation

## 2017-06-04 DIAGNOSIS — E871 Hypo-osmolality and hyponatremia: Secondary | ICD-10-CM | POA: Diagnosis not present

## 2017-06-04 DIAGNOSIS — N390 Urinary tract infection, site not specified: Secondary | ICD-10-CM | POA: Diagnosis not present

## 2017-06-04 DIAGNOSIS — Z9221 Personal history of antineoplastic chemotherapy: Secondary | ICD-10-CM | POA: Insufficient documentation

## 2017-06-04 DIAGNOSIS — C3432 Malignant neoplasm of lower lobe, left bronchus or lung: Secondary | ICD-10-CM | POA: Insufficient documentation

## 2017-06-04 DIAGNOSIS — Z1612 Extended spectrum beta lactamase (ESBL) resistance: Secondary | ICD-10-CM

## 2017-06-04 DIAGNOSIS — Z923 Personal history of irradiation: Secondary | ICD-10-CM | POA: Diagnosis not present

## 2017-06-04 DIAGNOSIS — S22060S Wedge compression fracture of T7-T8 vertebra, sequela: Secondary | ICD-10-CM

## 2017-06-04 DIAGNOSIS — I700 Atherosclerosis of aorta: Secondary | ICD-10-CM | POA: Diagnosis not present

## 2017-06-04 DIAGNOSIS — R5382 Chronic fatigue, unspecified: Secondary | ICD-10-CM | POA: Diagnosis not present

## 2017-06-04 DIAGNOSIS — D649 Anemia, unspecified: Secondary | ICD-10-CM

## 2017-06-04 DIAGNOSIS — R5381 Other malaise: Secondary | ICD-10-CM | POA: Diagnosis not present

## 2017-06-04 DIAGNOSIS — R0781 Pleurodynia: Secondary | ICD-10-CM

## 2017-06-04 DIAGNOSIS — Z87440 Personal history of urinary (tract) infections: Secondary | ICD-10-CM

## 2017-06-04 DIAGNOSIS — M797 Fibromyalgia: Secondary | ICD-10-CM | POA: Diagnosis not present

## 2017-06-04 DIAGNOSIS — E538 Deficiency of other specified B group vitamins: Secondary | ICD-10-CM

## 2017-06-04 DIAGNOSIS — J440 Chronic obstructive pulmonary disease with acute lower respiratory infection: Secondary | ICD-10-CM | POA: Diagnosis not present

## 2017-06-04 DIAGNOSIS — X58XXXS Exposure to other specified factors, sequela: Secondary | ICD-10-CM | POA: Diagnosis not present

## 2017-06-04 DIAGNOSIS — Z5111 Encounter for antineoplastic chemotherapy: Secondary | ICD-10-CM | POA: Diagnosis not present

## 2017-06-04 DIAGNOSIS — J189 Pneumonia, unspecified organism: Secondary | ICD-10-CM

## 2017-06-04 MED ORDER — CYANOCOBALAMIN 1000 MCG/ML IJ SOLN: 1000.0000 ug | Freq: Once | INTRAMUSCULAR | Status: DC

## 2017-06-04 MED ORDER — CYANOCOBALAMIN 1000 MCG/ML IJ SOLN
1000.0000 ug | Freq: Once | INTRAMUSCULAR | Status: AC
  Administered 2017-06-04: 1000 ug via INTRAMUSCULAR

## 2017-06-04 MED ORDER — TECHNETIUM TC 99M MEDRONATE IV KIT
22.3680 | PACK | Freq: Once | INTRAVENOUS | Status: AC | PRN
  Administered 2017-06-04: 22.368 via INTRAVENOUS

## 2017-06-04 NOTE — Progress Notes (Signed)
Patient continues to be SOB.  Appetite decreased.  States she has had a hard time breathing over the past week.  O2 92% today on RA.  Here today for Bone Scan results.

## 2017-06-04 NOTE — Progress Notes (Signed)
Radiation Oncology Follow up Note  Name: [name] [name]   Date:   06/04/2017 MRN:  [id number] DOB: [birth year]    This 77 y.o. female presents to the clinic today for valuation of bilateral rib pain in patient with known squamous cell carcinoma the left lower lobe stage IIIa  REFERRING PROVIDER: Tracie Harrier, MD  HPI: patient is a 77 year old female now out 8 months having completed concurrent chemoradiation therapy for stage IIIa squamous cell carcinoma of the left lower lobe..she has recently been treated with carboplatinum and Abraxane and Keytruda. She recently had a kyphoplasty of the T7-T8 region showing no evidence of malignancy. She just had a repeat bone scan showing only increased uptake in that region consistent with prior surgery. No lesions in her ribs were noted.  COMPLICATIONS OF TREATMENT: none  FOLLOW UP COMPLIANCE: keeps appointments   PHYSICAL EXAM:  BP 124/76   Pulse 97   Resp 18   Wt 113 lb 5.1 oz (51.4 kg)   BMI 20.73 kg/m  Frail-appearing wheelchair-bound female in NAD. Direct compression of the ribs does not elicit pain.Motor sensory and DTR levels are equal and symmetric in the upper lower extremities. Well-developed well-nourished patient in NAD. HEENT reveals PERLA, EOMI, discs not visualized.  Oral cavity is clear. No oral mucosal lesions are identified. Neck is clear without evidence of cervical or supraclavicular adenopathy. Lungs are clear to A&P. Cardiac examination is essentially unremarkable with regular rate and rhythm without murmur rub or thrill. Abdomen is benign with no organomegaly or masses noted. Motor sensory and DTR levels are equal and symmetric in the upper and lower extremities. Cranial nerves II through XII are grossly intact. Proprioception is intact. No peripheral adenopathy or edema is identified. No motor or sensory levels are noted. Crude visual fields are within normal range.  RADIOLOGY RESULTS: bone scan previous MRI of thoracic  spine reviewed and compatible with the above-stated findings  PLAN: this time a believe were dealing with referred pain to her ribs from her prior thoracic surgery. May make a referral to pain clinic. I see no role for palliative radiation therapy at this time. I'm I've asked the patient for follow-up in 6 months. I be happy to reevaluate patient at any time should further palliative radiation therapy be indicated.  I would like to take this opportunity to thank you for allowing me to participate in the care of your patient.Noreene Filbert, MD

## 2017-06-04 NOTE — Progress Notes (Signed)
[facility] day:  06/04/2017    Chief Complaint: [name] [name] is a 77 y.o. female with presumed metastatic squamous cell carcinoma of the left lower lobe who is seen for reassessment on day 28 of cycle #3 carboplatin, Abraxane, and pembrolizumab following interval hospitalization .  HPI: The patient was last seen in the medical oncology clinic on 05/21/2017.  At that time, urinary symptoms persisted. She had an untreated ESBL producing E coli.  She had hyponatremia (Na 131).  WBC was 1900 with an [place] of 1000.  She was admitted to [facility] from 05/21/2017 - 05/24/2017 for acute low back pain secondary to T7 compression fracture.  She underwent kyphoplasty on 05/22/2017.  Post-op, she had minimal pain.  Her her ESBL, she received 3 days of meropenem.  Dr. Ola Spurr did not recommend antibiotics at discharge.  She contacted the clinic on 05/2/10291 regarding rib pain.  Bone scan on 06/04/2017 revealed abnormal radiotracer uptake at T7 and T8 at the sites of recent kyphoplasty procedures. There was stable increased uptake in the mid lower lumbar regions, likely of arthropathic etiology. Elsewhere, the distribution of uptake is unremarkable.  Bone scan on 06/04/2017 revealed abnormal radiotracer uptake at T7 and T8 at the sites of recent kyphoplasty procedures. There was stable increased uptake in the mid lower lumbar regions, likely of arthropathic etiology. Elsewhere, the distribution of uptake is unremarkable.  Symptomatically, patient is having severe bilateral rib pain. Patient states, "I cant bend over and pick anything up. If I go, I feel like I am going to pass out".  Patient is having severe pain despite PRN Oxycodone. Patient is only taking 1 tab every 6 to 8 hours. Duragesic patches were discontinued during patient's last inpatient admission.   Patient is eating well. Weight remains stable.    Past Medical History:  Diagnosis Date  . Asthma   .  Breast cancer ([place]) 2009   left  . Cancer ([place])   . CHF (congestive heart failure) ([place])   . CHF (congestive heart failure) ([place])   . Chronic UTI   . COPD (chronic obstructive pulmonary disease) ([place])   . Dizziness   . Fibromyalgia   . Hypertension   . Neuropathy   . Personal history of tobacco use, presenting hazards to health 05/17/2015  . Polyp, larynx   . RA (rheumatoid arthritis) ([place])   . Sinus infection    recent  . Stumbling gait    to the left  . Supplemental oxygen dependent    2.5l    Past Surgical History:  Procedure Laterality Date  . ABDOMINAL HYSTERECTOMY    . BREAST LUMPECTOMY Left 2009   chemo and radiation  . CYST EXCISION Left 02/27/2015   Procedure: CYST REMOVAL;  Surgeon: Hessie Knows, MD;  Location: [facility] ORS;  Service: Orthopedics;  Laterality: Left;  . EYE MUSCLE SURGERY Right    13 surgeries  . FLEXIBLE BRONCHOSCOPY N/A 07/01/2016   Procedure: FLEXIBLE BRONCHOSCOPY;  Surgeon: Wilhelmina Mcardle, MD;  Location: [facility] ORS;  Service: Pulmonary;  Laterality: N/A;  . FLEXIBLE BRONCHOSCOPY N/A 02/09/2017   Procedure: FLEXIBLE BRONCHOSCOPY;  Surgeon: [name], MD;  Location: [facility] ORS;  Service: Pulmonary;  Laterality: N/A;  . KYPHOPLASTY N/A 05/22/2017   Procedure: Celine Ahr;  Surgeon: Hessie Knows, MD;  Location: [facility] ORS;  Service: Orthopedics;  Laterality: N/A;  . PORTA CATH INSERTION N/A 03/04/2017   Procedure: PORTA CATH INSERTION;  Surgeon: [name], MD;  Location: [facility];  Service: Cardiovascular;  Laterality: N/A;  . THUMB ARTHROSCOPY Left     Family History  Problem Relation Age of Onset  . Diabetes Father   . Stroke Father   . Heart attack Father   . CAD Sister     Social History:  reports that she has been smoking cigarettes.  She has a 40.00 pack-year smoking history. She has never used smokeless tobacco. She reports that she does not drink alcohol or use drugs.  He lives with her son and grandson. The  patient is alone today.   Allergies:  Allergies  Allergen Reactions  . Contrast Media [Iodinated Diagnostic Agents] Other (See Comments)    Pt was sent to the ED following contrast media injection at [place]. Unknown reason. She has been premedicated since without complications. Pt to be premedicated prior to contrast media injections  . Ioxaglate Other (See Comments)    Pt was sent to the ED following contrast media injection at [place]. Unknown reason. She has been premedicated since without complications. Pt to be premedicated prior to contrast media injections  . Sulfa Antibiotics Other (See Comments)    Unknown  . Tetracycline Hives  . White Petrolatum Other (See Comments)    Blisters  . Amoxicillin-Pot Clavulanate Rash and Other (See Comments)    Blisters in mouth Has patient had a PCN reaction causing immediate rash, facial/tongue/throat swelling, SOB or lightheadedness with hypotension: No Has patient had a PCN reaction causing severe rash involving mucus membranes or skin necrosis: No Has patient had a PCN reaction that required hospitalization: No Has patient had a PCN reaction occurring within the last 10 years: No If all of the above answers are "NO", then may proceed with Cephalosporin use.   . Tape Rash    Current Medications: Current Outpatient Medications  Medication Sig Dispense Refill  . albuterol (PROVENTIL HFA;VENTOLIN HFA) 108 (90 Base) MCG/ACT inhaler Inhale 1-2 puffs into the lungs every 6 (six) hours as needed for wheezing or shortness of breath. 1 Inhaler 2  . Calcium-Vitamin D 600-200 MG-UNIT tablet Take 1 tablet by mouth daily.     . carvedilol (COREG) 12.5 MG tablet Take 1 tablet (12.5 mg total) by mouth 2 (two) times daily with a meal. 60 tablet 0  . cholecalciferol (VITAMIN D) 1000 units tablet Take 1,000 Units by mouth daily.    . clonazePAM (KLONOPIN) 1 MG tablet Take 1 mg by mouth 2 (two) times daily as needed for anxiety.     [name]  dexamethasone (DECADRON) 4 MG tablet Take 2 tablets by mouth once a day on the day after chemotherapy and then take 2 tablets two times a day for 2 days. Take with food. 30 tablet 1  . gabapentin (NEURONTIN) 600 MG tablet Take 600 mg by mouth 3 (three) times daily.     [name] HYDROcodone-acetaminophen (NORCO/VICODIN) 5-325 MG tablet Take 1 tablet by mouth 2 (two) times daily. 30 tablet 0  . imipramine (TOFRANIL) 25 MG tablet Take 25 mg by mouth at bedtime.     . lidocaine (LIDODERM) 5 % Place 1 patch onto the skin daily. Remove & Discard patch within 12 hours or as directed by MD 30 patch 0  . lidocaine-prilocaine (EMLA) cream Apply 1 application topically as needed. 30 g 3  . LORazepam (ATIVAN) 0.5 MG tablet Take 1 tablet (0.5 mg total) by mouth every 6 (six) hours as needed (Nausea or vomiting). 30 tablet 0  . Multiple  Vitamin (MULTIVITAMIN) capsule Take 1 capsule by mouth daily.    . nitroGLYCERIN (NITROSTAT) 0.4 MG SL tablet Place 0.4 mg under the tongue every 5 (five) minutes as needed for chest pain.     [name] ondansetron (ZOFRAN) 8 MG tablet Take 1 tablet (8 mg total) by mouth 2 (two) times daily as needed. Start on the third day after chemotherapy. 30 tablet 1  . oxyCODONE (OXY IR/ROXICODONE) 5 MG immediate release tablet Take 1 tablet (5 mg total) by mouth every 6 (six) hours as needed for severe pain. 30 tablet 0  . Tiotropium Bromide Monohydrate (SPIRIVA RESPIMAT) 2.5 MCG/ACT AERS Inhale 2.5 mcg into the lungs 2 (two) times daily. 1 Inhaler 0  . traMADol (ULTRAM) 50 MG tablet Take 100 mg by mouth daily.     . vitamin C (ASCORBIC ACID) 500 MG tablet Take 500 mg by mouth daily.    [name] ZINC SULFATE PO Take 1 tablet by mouth daily.     [name] zolpidem (AMBIEN) 5 MG tablet Take 5 mg by mouth at bedtime.     No current facility-administered medications for this visit.    Facility-Administered Medications Ordered in Other Visits  Medication Dose Route Frequency Provider Last Rate Last Dose  . cyanocobalamin  ((VITAMIN B-12)) injection 1,000 mcg  1,000 mcg Intramuscular Once Karen Kitchens, NP        Review of Systems  Constitutional: Positive for malaise/fatigue. Negative for diaphoresis, fever and weight loss (stable).  HENT: Negative.  Negative for congestion, ear pain, nosebleeds and sore throat.   Eyes: Negative.  Negative for blurred vision, photophobia, pain and discharge.  Respiratory: Positive for cough (Sputum culture (+) for Corynebacterium striatum), sputum production (yellow) and shortness of breath (chronic; has lung cancer and COPD). Negative for hemoptysis.        Shortness of breath with walking  Cardiovascular: Positive for orthopnea. Negative for chest pain, palpitations, leg swelling and PND.  Gastrointestinal: Negative for abdominal pain, blood in stool, constipation, diarrhea, melena, nausea and vomiting.  Genitourinary: Positive for frequency and urgency. Negative for dysuria and hematuria.       Recurrent ESBL producing E.coli infections  Musculoskeletal: Positive for back pain (Significant pain to back and BILATERAL ribs; s/p wedge resection). Negative for falls, joint pain and myalgias.  Skin: Negative for itching and rash.  Neurological: Positive for weakness. Negative for dizziness, tremors and headaches.  Endo/Heme/Allergies: Does not bruise/bleed easily.  Psychiatric/Behavioral: Negative for depression, memory loss and suicidal ideas. The patient is not nervous/anxious and does not have insomnia.   All other systems reviewed and are negative.  Physical Exam: Blood pressure 124/76, pulse 97, temperature 97.6 F (36.4 C), temperature source Oral, resp. rate 18, weight 112 lb (50.8 kg), SpO2 92 %.  GENERAL:  Chronically fatigued appearing thin woman sitting comfortably in the exam room in no acute distress. MENTAL STATUS:  Alert and oriented to person, place and time. HEAD:  Alopecia totalis.  Normocephalic, atraumatic, face symmetric, no Cushingoid features. EYES:   Blue eyes.  Pupils equal round and reactive to light and accomodation.  No conjunctivitis or scleral icterus. ENT:  Oropharynx clear without lesion.  Tongue normal. Mucous membranes moist.  RESPIRATORY:  Clear to auscultation without rales, wheezes or rhonchi. CARDIOVASCULAR:  Regular rate and rhythm without murmur, rub or gallop. ABDOMEN:  Soft, non-tender, with active bowel sounds, and no hepatosplenomegaly.  No masses. BACK/RIBS:  Tender to palpation mid thoracic region and right lateral ribs. SKIN:  No rashes, ulcers  or lesions. EXTREMITIES: No edema, no skin discoloration or tenderness.  No palpable cords. LYMPH NODES: No palpable cervical, supraclavicular, axillary or inguinal adenopathy  NEUROLOGICAL: Unremarkable. PSYCH:  Appropriate.    No visits with results within 3 Day(s) from this visit.  Latest known visit with results is:  Admission on 05/21/2017, Discharged on 05/24/2017  Component Date Value Ref Range Status  . Sodium 05/22/2017 132* 135 - 145 mmol/L Final  . Potassium 05/22/2017 3.8  3.5 - 5.1 mmol/L Final  . Chloride 05/22/2017 98* 101 - 111 mmol/L Final  . CO2 05/22/2017 27  22 - 32 mmol/L Final  . Glucose, Bld 05/22/2017 96  65 - 99 mg/dL Final  . BUN 05/22/2017 14  6 - 20 mg/dL Final  . Creatinine, Ser 05/22/2017 1.04* 0.44 - 1.00 mg/dL Final  . Calcium 05/22/2017 8.2* 8.9 - 10.3 mg/dL Final  . GFR calc non Af Amer 05/22/2017 52* >60 mL/min Final  . GFR calc Af Amer 05/22/2017 60* >60 mL/min Final   Comment: (NOTE) The eGFR has been calculated using the CKD EPI equation. This calculation has not been validated in all clinical situations. eGFR's persistently <60 mL/min signify possible Chronic Kidney Disease.   Georgiann Hahn gap 05/22/2017 7  5 - 15 Final   Performed at [facility], [address] [place], [place] [address]  . WBC 05/22/2017 1.7* 3.6 - 11.0 K/uL Final  . RBC 05/22/2017 2.76* 3.80 - 5.20 MIL/uL Final  . Hemoglobin 05/22/2017 8.6* 12.0 -  16.0 g/dL Final  . HCT 05/22/2017 25.6* 35.0 - 47.0 % Final  . MCV 05/22/2017 92.7  80.0 - 100.0 fL Final  . MCH 05/22/2017 31.3  26.0 - 34.0 pg Final  . MCHC 05/22/2017 33.7  32.0 - 36.0 g/dL Final  . RDW 05/22/2017 16.0* 11.5 - 14.5 % Final  . Platelets 05/22/2017 110* 150 - 440 K/uL Final   Performed at [facility], [address] [place], [place] [address]  . Color, Urine 05/21/2017 YELLOW* YELLOW Final  . APPearance 05/21/2017 CLEAR* CLEAR Final  . Specific Gravity, Urine 05/21/2017 1.008  1.005 - 1.030 Final  . pH 05/21/2017 5.0  5.0 - 8.0 Final  . Glucose, UA 05/21/2017 NEGATIVE  NEGATIVE mg/dL Final  . Hgb urine dipstick 05/21/2017 NEGATIVE  NEGATIVE Final  . Bilirubin Urine 05/21/2017 NEGATIVE  NEGATIVE Final  . Ketones, ur 05/21/2017 NEGATIVE  NEGATIVE mg/dL Final  . Protein, ur 05/21/2017 NEGATIVE  NEGATIVE mg/dL Final  . Nitrite 05/21/2017 NEGATIVE  NEGATIVE Final  . Leukocytes, UA 05/21/2017 SMALL* NEGATIVE Final  . RBC / HPF 05/21/2017 0-5  0 - 5 RBC/hpf Final  . WBC, UA 05/21/2017 0-5  0 - 5 WBC/hpf Final  . Bacteria, UA 05/21/2017 NONE SEEN  NONE SEEN Final  . Squamous Epithelial / LPF 05/21/2017 0-5  0 - 5 Final  . Mucus 05/21/2017 PRESENT   Final  . Non Squamous Epithelial 05/21/2017 0-5* NONE SEEN Final   Performed at [facility], [address] [place], [place] [address]  . Specimen Description 05/21/2017    Final                   Value:URINE, CLEAN CATCH Performed at [facility], [address] [place], [place] [address]   . Special Requests 05/21/2017    Final                   Value:NONE Performed at [facility], [phone]  [address] [place], [place] [address]   . Culture 05/21/2017 MULTIPLE SPECIES PRESENT, SUGGEST RECOLLECTION*  Final  . Report Status 05/21/2017 05/23/2017 FINAL   Final  . SURGICAL PATHOLOGY 05/22/2017    Final                   Value:Surgical Pathology CASE: [id number] PATIENT: [name]  [name] Surgical Pathology Report     SPECIMEN SUBMITTED: A. T7 Bone biopsy B. T8 Bone biopsy  CLINICAL HISTORY: History of left breast cancer and left lung cancer  PRE-OPERATIVE DIAGNOSIS: Thoracic compression fractures  POST-OPERATIVE DIAGNOSIS: Same as pre op     DIAGNOSIS: A. BONE, T7; BIOPSY: - BONE AND MARROW STROMA WITH FOCAL NECROSIS, HEMORRHAGE, AND REACTIVE CHANGE CONSISTENT WITH PROVIDED HISTORY OF FRACTURE. - NEGATIVE FOR MALIGNANCY.  B.  BONE, T8; BIOPSY: - BONE AND MARROW STROMA WITH FOCAL NECROSIS, HEMORRHAGE, AND REACTIVE CHANGE CONSISTENT WITH PROVIDED HISTORY OF FRACTURE. - TRILINEAGE HEMATOPOIESIS. - NEGATIVE FOR MALIGNANCY.   GROSS DESCRIPTION: A. Labeled: T7 bone biopsy Received: In formalin Tissue fragment(s): 1 Size: 0.8 x 0.1 x 0.1 cm Description: Tan bony fragment Entirely submitted in one cassette.  B. Labeled: T8 bone biopsy Received: In formalin Tissue fragmen                         t(s): 1 Size: 0.5 x 0.1 x 0.1 cm Description: Brown bony fragment Entirely submitted in one cassette  Tissue decalcification : yes A1 and B1.   Final Diagnosis performed by Quay Burow, MD.   Electronically signed 05/26/2017 9:18:32AM The electronic signature indicates that the named Attending Pathologist has evaluated the specimen  Technical component performed at [facility], [address] [place], [place] [address] Lab: [phone] Dir: Rush Farmer, MD, MMM  Professional component performed at [facility], [facility], [place], [place], [place] [address] Lab: [phone] Dir: Dellia Nims. Rubinas, MD   . Sodium 05/23/2017 135  135 - 145 mmol/L Final  . Potassium 05/23/2017 4.0  3.5 - 5.1 mmol/L Final  . Chloride 05/23/2017 102  101 - 111 mmol/L Final  . CO2 05/23/2017 27  22 - 32 mmol/L Final  . Glucose, Bld 05/23/2017 95  65 - 99 mg/dL Final  . BUN 05/23/2017 18  6 - 20 mg/dL Final  . Creatinine, Ser 05/23/2017 1.03*  0.44 - 1.00 mg/dL Final  . Calcium 05/23/2017 8.0* 8.9 - 10.3 mg/dL Final  . GFR calc non Af Amer 05/23/2017 52* >60 mL/min Final  . GFR calc Af Amer 05/23/2017 >60  >60 mL/min Final   Comment: (NOTE) The eGFR has been calculated using the CKD EPI equation. This calculation has not been validated in all clinical situations. eGFR's persistently <60 mL/min signify possible Chronic Kidney Disease.   Georgiann Hahn gap 05/23/2017 6  5 - 15 Final   Performed at [facility], [address] [place], [place] [address]  . WBC 05/23/2017 4.3  3.6 - 11.0 K/uL Final  . RBC 05/23/2017 3.05* 3.80 - 5.20 MIL/uL Final  . Hemoglobin 05/23/2017 9.5* 12.0 - 16.0 g/dL Final  . HCT 05/23/2017 28.3* 35.0 - 47.0 % Final  . MCV 05/23/2017 92.8  80.0 - 100.0 fL Final  . MCH 05/23/2017 31.3  26.0 - 34.0 pg Final  . MCHC 05/23/2017 33.7  32.0 - 36.0 g/dL Final  . RDW 05/23/2017 16.5* 11.5 - 14.5 % Final  . Platelets 05/23/2017 137* 150 - 440 K/uL Final  . Neutrophils  Relative % 05/23/2017 71  % Final  . Neutro Abs 05/23/2017 3.1  1.4 - 6.5 K/uL Final  . Lymphocytes Relative 05/23/2017 16  % Final  . Lymphs Abs 05/23/2017 0.7* 1.0 - 3.6 K/uL Final  . Monocytes Relative 05/23/2017 11  % Final  . Monocytes Absolute 05/23/2017 0.5  0.2 - 0.9 K/uL Final  . Eosinophils Relative 05/23/2017 1  % Final  . Eosinophils Absolute 05/23/2017 0.0  0 - 0.7 K/uL Final  . Basophils Relative 05/23/2017 1  % Final  . Basophils Absolute 05/23/2017 0.1  0 - 0.1 K/uL Final   Performed at [facility], [address] [place], [place] [address]  . Suscept, Aer + Anaerob 05/15/2017 Final report   Corrected   Comment: (NOTE) Performed At: [facility] [address] [place], [place] [id number] Rush Farmer MD ZP:[id number] CORRECTED ON 05/16 AT 0840: PREVIOUSLY REPORTED AS Preliminary report   . Source of Sample 05/15/2017 URINE, RANDOM   Final   Performed at [facility], [place] [address],  [place], [place] [address]  . WBC 05/24/2017 9.7  3.6 - 11.0 K/uL Final  . RBC 05/24/2017 3.17* 3.80 - 5.20 MIL/uL Final  . Hemoglobin 05/24/2017 10.1* 12.0 - 16.0 g/dL Final  . HCT 05/24/2017 29.5* 35.0 - 47.0 % Final  . MCV 05/24/2017 93.2  80.0 - 100.0 fL Final  . MCH 05/24/2017 31.8  26.0 - 34.0 pg Final  . MCHC 05/24/2017 34.1  32.0 - 36.0 g/dL Final  . RDW 05/24/2017 16.5* 11.5 - 14.5 % Final  . Platelets 05/24/2017 147* 150 - 440 K/uL Final  . Neutrophils Relative % 05/24/2017 78  % Final  . Neutro Abs 05/24/2017 7.7* 1.4 - 6.5 K/uL Final  . Lymphocytes Relative 05/24/2017 13  % Final  . Lymphs Abs 05/24/2017 1.2  1.0 - 3.6 K/uL Final  . Monocytes Relative 05/24/2017 7  % Final  . Monocytes Absolute 05/24/2017 0.7  0.2 - 0.9 K/uL Final  . Eosinophils Relative 05/24/2017 1  % Final  . Eosinophils Absolute 05/24/2017 0.1  0 - 0.7 K/uL Final  . Basophils Relative 05/24/2017 1  % Final  . Basophils Absolute 05/24/2017 0.0  0 - 0.1 K/uL Final   Performed at [facility], [address] [place], [place] [address]  . Suscept Result 1 05/15/2017 Escherichia coli   Final   Comment: (NOTE) FOSFOMYCIN 128 ug/ml INTERMEDIATE Performed At: [facility] [place], [place] [id number] Rush Farmer MD GB:[id number] Performed at [facility], [place] [address] [place], [place] [address]   . Antimicrobial Suscept 05/15/2017 PENDING   Incomplete    Assessment:  [name] [name] is a 77 y.o. female with presumed metastatic squamous cell lung cancer.  She presented with clinical [id number] squamous cell lung cancer of the left lung s/p bronchoscopy and biopsy on 07/01/2016.  She has a 40 pack year smoking history.  She presented with left lower chest wall pain.  Foundation One testing on 03/13/2017 revealed no reportable alterations in EGFR, RET, ALK, MET, ERBB2, BRAF, and ROS1.    Tumor was MS-stable.  TMB was 3 Muts/Mb.  There was CDKN2A loss, CDKN2B loss, KRAS G12V,  MTAP loss, and TP53 R282W.  PD-L1 was 5%.  Chest CT with contrast on 06/11/2016 revealed a 3.2 x 3.0 mass like area of focal opacity in the medial left lower lobe with obliteration of segmental airways to the anterior left lower lobe.  PET scan  on 06/27/2016 revealed a 4.3 x 3.2 cm central left lower lobe lung lesion (SUV 14.3) consistent with primary bronchogenic carcinoma.  There was equivocal nodal tissue in the subcarinal station demonstrating mild hypermetabolism (SUV 3.6). There was more peripheral left lower lobe increased atelectasis with mucoid impaction which is likely secondary to endobronchial obstruction.  There was a small left pleural effusion.  Head MRI on 07/07/2016 revealed no evidence of metastatic disease.  She received radiation from 07/22/2016 - 09/19/2016.  She received 5 weeks of concurrent carboplatin and Taxol (07/24/2016 - 08/11/2016; 09/05/2016 - 09/16/2016). She requires a reduced dose of Benadryl (25 mg) for her premedication.  Week #3 was postponed secondary to chest pain and evaluation.  She missed a couple of weeks.  Chest CT on 11/21/2016 revealed progressive collapse of the LEFT lower lobe presumably related to central obstructing mass.  Mass obstructed the LEFT lower lobe bronchus, although mass was not well demonstrated.  There were small effusions (no change).  There was centrilobular emphysema unchanged.  There was no evidence pneumonia.  PET scan on 01/27/2017 revealed interval response to therapy.  The previously noted hypermetabolism associated with left lower lobe perihilar lung mass has resolved in the interval.  There was a moderate left pleural effusion that had increased in volume from previous PET-CT and there was now complete atelectasis/consolidation of the left lower lobe, which may obscure residual mass.  There was no new sites of hypermetabolism or evidence of distant metastatic disease.  Bronchoscopy on 02/09/2017 revealed no evidence of  endobronchial tumor.  There was stenosis at the entry to the left lower posterior segments which could not be entered.  There was mildly erythematous mucosa with copious secretions in the left lower lobe.  Brushings were positive for non-small cell carcinoma, favor squamous cell carcinoma.  Ultrasound guided thoracentesis on 02/17/2017 revealed was negative for malignancy.  CEA was 7.9 on 04/09/2017.  Bone scan on 02/27/2017 revealed a subtle focus of uptake in the left 7th rib, indeterminate.  Bone scan on 06/04/2017 revealed abnormal radiotracer uptake at T7 and T8 at the sites of recent kyphoplasty procedures. There was stable increased uptake in the mid lower lumbar regions, likely of arthropathic etiology. Elsewhere, the distribution of uptake is unremarkable.  She is day 28 s/p cycle #3 carboplatin, Abraxane and pembrolizumab (03/19/2017 - 05/08/2017).  Cycle #2 was truncated secondary to pneumonia.  She has a normocytic anemia likely due to chemotherapy.  Work-up on 07/24/2016 revealed the following normal labs: ferritin (103), B12 (345), folate(19.7).  Iron saturation was 8% and TIBC was 258.  She has B12 deficiency.  B12 was 254 on 04/16/2017 and MMA 789 (high) on 04/23/2017.  She began weekly B12 on 04/30/2016 (last 05/08/2017).  She has a history of stage IA left breast cancer in 02/2005.  She underwent lumpectomy.  Pathology revealed a T1cN0 lesion.  Tumor was ER + , PR +, and Her2/neu -.  Two sentinel lymph nodes were negative.  She received chemotherapy (4 cycles of AC and possibly an abbreviated course of Taxol- no records available).  She received radiation.  She completed Femara in 08/2010.  She has fibromyalgia.  She has advanced thoracoabdominal aortic atherosclerosis with fusiform dilatation of the descending thoracic aorta up to 4.3 cm.  She is followed by Dr. Lucky Cowboy.  She has a history of recurrent UTIs.  UA on 03/19/2017 revealed  >100,000 CFU/mL of an ESBL producing E. coli.  She was  treated with fosfomycin 3 g x 1 dose.  Urine culture on 05/15/2017 revealed ESBL producing E coli.  Sputum culture on 05/15/2017 revealed Corynebacterium striatum.  Chest CT angiogram on 04/30/2017 revealed no pulmonary embolism.  There was a new wedge compression fracture of T7.  She has chronic atelectasis of the left lower lobe and left sided effusion.  Thoracic spine MRI on 05/15/2017 revealed severe benign-appearing subacute compression fracture of T7 without neural impingement.  There was acute new compression fracture of the superior endplate of T8.  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  She underwent T7 kyphoplasty on 05/22/2017.    Symptomatically, she remains fatigued. She has significant pain to her back and BILATERAL ribs.  She has no known rib lesions.  Pain is poorly controlled with oxycodone 5 mg every 4 hours (6 pills/day). Duragesic patches discontinued in the hospital.   Plan: 1. Discuss interval bone scan- activity at T7 and T8.  No activity in ribs.  Suspect pain originating in back.  Plan follow-up with orthopedics. 2. Discuss pain management. Currently taking oxycodone 5 mg every 4 hours (6 pills/day). Duragesic patches discontinued in the hospital. Will restart Duragesic patches at 25 mcg/hr strength. Titrate patch. 3. Discuss follow up with orthopedics for further evaluation. Spoke with Arvella Nigh, PA-C via phone. Patient to be worked into the clinic schedule on 06/09/2017 at 1415. 4. Discuss resumption of chemotherapy treatments after seen by orthopedics.  5. Continue weekly B12 x 6 (week #3 today) then monthly. 6. RTC on 06/15/17 for MD assessment, labs (CBC with diff, CMP, Mg), and +/- carboplatin, Abraxane, and pembrolizumab   Honor Loh, NP  06/04/2017, 2:26 PM    I saw and evaluated the patient, participating in the key portions of the service and reviewing pertinent diagnostic studies and  records.  I reviewed the nurse practitioner's note and agree with the findings and the plan.  The assessment and plan were discussed with the patient.  Several questions were asked by the patient and answered.   Nolon Stalls, MD 06/04/2017,2:26 PM

## 2017-06-04 NOTE — Patient Instructions (Signed)
We have gotten you worked in to see Rachelle Hora, PA-C at [place] on 06/09/2017 at 2:15 pm. If you are unable to keep this appointment, please call the clinic to let them know.   Honor Loh, MSN, APRN, FNP-C, CEN Oncology/Hematology Nurse Practitioner  [facility] 06/04/17, 2:46 PM

## 2017-06-10 NOTE — Progress Notes (Signed)
06/11/2017 3:56 PM   [name] [name] [birth year] [id number]  Referring provider: Tracie Harrier, MD [address] [facility] [place], [place] [address]  Chief Complaint  Patient presents with  . Recurrent UTI    HPI: Patient is a 77 -year-old Caucasian female who presents today as a follow up for microscopic hematuria.  She was seen in consult with Dr. Junious Silk during her recent hospitalization in May 2019.  She had a history of recurrent ESBL E. coli, but she did not have symptoms of a urinary tract infection.  She was determined at that time to have asymptomatic bacteriuria and due to her neutropenia treatment was deferred to ID suggestions.  They have recommended only to treat the UTI if she becomes neutropenic and/or is having symptoms of an UTI.    She was noted to have numerous instances of microscopic hematuria.    Today, she is having symptoms of frequent urination x q 12mnutes, strong urgency, intermittent dysuria, nocturia only when the dog wakes her up,  SUI incontinence wears two pads daily, hesitancy, intermittency, straining to urinate and a weak urinary stream.  Her UA today demonstrates >30WBC's, many bacteria and nitrite positive.  She states she only has these symptoms when she has infections, but they have been occurring on and off for the last several months.    Patient denies any gross hematuria or suprapubic/flank pain.  Patient denies any fevers, chills, nausea or vomiting.    RUS in 02/2016 noted medical renal disease and scarring.  No hydronephrosis, masses or stones were seen.  She is a smoker.   They have not worked with [employer] administrator trichloroethylene, etc.   She is drinking several 16 ounces of water daily.  She is drinking three cups of coffee in the am.    PMH: Past Medical History:  Diagnosis Date  . Asthma   . Breast cancer ([facility]   left  . Cancer ([place]   . CHF (congestive heart failure) ([facility]   . CHF (congestive  heart failure) ([facility]   . Chronic UTI   . COPD (chronic obstructive pulmonary disease) ([place] [place]   . Dizziness   . Fibromyalgia   . Hypertension   . Neuropathy   . Personal history of tobacco use, presenting hazards to health 05/17/2015  . Polyp, larynx   . RA (rheumatoid arthritis) ([facility]   . Sinus infection    recent  . Stumbling gait    to the left  . Supplemental oxygen dependent    2.5l    Surgical History: Past Surgical History:  Procedure Laterality Date  . ABDOMINAL HYSTERECTOMY    . BREAST LUMPECTOMY Left 2009   chemo and radiation  . CYST EXCISION Left 02/27/2015   Procedure: CYST REMOVAL;  Surgeon: MHessie Knows MD;  Location: [facility] ORS;  Service: Orthopedics;  Laterality: Left;  . EYE MUSCLE SURGERY Right    13 surgeries  . FLEXIBLE BRONCHOSCOPY N/A 07/01/2016   Procedure: FLEXIBLE BRONCHOSCOPY;  Surgeon: SWilhelmina Mcardle MD;  Location: [facility] ORS;  Service: Pulmonary;  Laterality: N/A;  . FLEXIBLE BRONCHOSCOPY N/A 02/09/2017   Procedure: FLEXIBLE BRONCHOSCOPY;  Surgeon: RLaverle Hobby MD;  Location: [facility] ORS;  Service: Pulmonary;  Laterality: N/A;  . KYPHOPLASTY N/A 05/22/2017   Procedure: KCeline Ahr  Surgeon: MHessie Knows MD;  Location: [facility] ORS;  Service: Orthopedics;  Laterality: N/A;  . PORTA CATH INSERTION N/A 03/04/2017   Procedure: PORTA CATH INSERTION;  Surgeon: DAlgernon Huxley MD;  Location:  [facility];  Service: Cardiovascular;  Laterality: N/A;  . THUMB ARTHROSCOPY Left     Home Medications:  Allergies as of 06/11/2017      Reactions   Contrast Media [iodinated Diagnostic Agents] Other (See Comments)   Pt was sent to the ED following contrast media injection at [place]. Unknown reason. She has been premedicated since without complications. Pt to be premedicated prior to contrast media injections   Ioxaglate Other (See Comments)   Pt was sent to the ED following contrast media injection at [place]. Unknown reason.  She has been premedicated since without complications. Pt to be premedicated prior to contrast media injections   Sulfa Antibiotics Other (See Comments)   Unknown   Tetracycline Hives   White Petrolatum Other (See Comments)   Blisters   Amoxicillin-pot Clavulanate Rash, Other (See Comments)   Blisters in mouth Has patient had a PCN reaction causing immediate rash, facial/tongue/throat swelling, SOB or lightheadedness with hypotension: No Has patient had a PCN reaction causing severe rash involving mucus membranes or skin necrosis: No Has patient had a PCN reaction that required hospitalization: No Has patient had a PCN reaction occurring within the last 10 years: No If all of the above answers are "NO", then may proceed with Cephalosporin use.   Tape Rash      Medication List        Accurate as of 06/11/17  3:56 PM. Always use your most recent med list.          albuterol 108 (90 Base) MCG/ACT inhaler Commonly known as:  PROVENTIL HFA;VENTOLIN HFA Inhale 1-2 puffs into the lungs every 6 (six) hours as needed for wheezing or shortness of breath.   Calcium-Vitamin D 600-200 MG-UNIT tablet Take 1 tablet by mouth daily.   carvedilol 12.5 MG tablet Commonly known as:  COREG Take 1 tablet (12.5 mg total) by mouth 2 (two) times daily with a meal.   cholecalciferol 1000 units tablet Commonly known as:  VITAMIN D Take 1,000 Units by mouth daily.   clonazePAM 1 MG tablet Commonly known as:  KLONOPIN Take 1 mg by mouth 2 (two) times daily as needed for anxiety.   dexamethasone 4 MG tablet Commonly known as:  DECADRON Take 2 tablets by mouth once a day on the day after chemotherapy and then take 2 tablets two times a day for 2 days. Take with food.   fentaNYL 12 MCG/HR Commonly known as:  DURAGESIC - dosed mcg/hr Place 12.5 mcg onto the skin every 3 (three) days.   gabapentin 600 MG tablet Commonly known as:  NEURONTIN Take 600 mg by mouth 3 (three) times daily.     HYDROcodone-acetaminophen 5-325 MG tablet Commonly known as:  NORCO/VICODIN Take 1 tablet by mouth 2 (two) times daily.   imipramine 25 MG tablet Commonly known as:  TOFRANIL Take 25 mg by mouth at bedtime.   lidocaine 5 % Commonly known as:  LIDODERM Place 1 patch onto the skin daily. Remove & Discard patch within 12 hours or as directed by MD   lidocaine-prilocaine cream Commonly known as:  EMLA Apply 1 application topically as needed.   LORazepam 0.5 MG tablet Commonly known as:  ATIVAN Take 1 tablet (0.5 mg total) by mouth every 6 (six) hours as needed (Nausea or vomiting).   mirabegron ER 25 MG Tb24 tablet Commonly known as:  MYRBETRIQ Take 1 tablet (25 mg total) by mouth daily.   multivitamin capsule Take 1 capsule by mouth  daily.   NITROSTAT 0.4 MG SL tablet Generic drug:  nitroGLYCERIN Place 0.4 mg under the tongue every 5 (five) minutes as needed for chest pain.   ondansetron 8 MG tablet Commonly known as:  ZOFRAN Take 1 tablet (8 mg total) by mouth 2 (two) times daily as needed. Start on the third day after chemotherapy.   oxyCODONE 5 MG immediate release tablet Commonly known as:  Oxy IR/ROXICODONE Take 1 tablet (5 mg total) by mouth every 6 (six) hours as needed for severe pain.   Tiotropium Bromide Monohydrate 2.5 MCG/ACT Aers Commonly known as:  SPIRIVA RESPIMAT Inhale 2.5 mcg into the lungs 2 (two) times daily.   traMADol 50 MG tablet Commonly known as:  ULTRAM Take 100 mg by mouth daily.   vitamin C 500 MG tablet Commonly known as:  ASCORBIC ACID Take 500 mg by mouth daily.   ZINC SULFATE PO Take 1 tablet by mouth daily.   zolpidem 5 MG tablet Commonly known as:  AMBIEN Take 5 mg by mouth at bedtime.       Allergies:  Allergies  Allergen Reactions  . Contrast Media [Iodinated Diagnostic Agents] Other (See Comments)    Pt was sent to the ED following contrast media injection at [place]. Unknown reason. She has been  premedicated since without complications. Pt to be premedicated prior to contrast media injections  . Ioxaglate Other (See Comments)    Pt was sent to the ED following contrast media injection at [place]. Unknown reason. She has been premedicated since without complications. Pt to be premedicated prior to contrast media injections  . Sulfa Antibiotics Other (See Comments)    Unknown  . Tetracycline Hives  . White Petrolatum Other (See Comments)    Blisters  . Amoxicillin-Pot Clavulanate Rash and Other (See Comments)    Blisters in mouth Has patient had a PCN reaction causing immediate rash, facial/tongue/throat swelling, SOB or lightheadedness with hypotension: No Has patient had a PCN reaction causing severe rash involving mucus membranes or skin necrosis: No Has patient had a PCN reaction that required hospitalization: No Has patient had a PCN reaction occurring within the last 10 years: No If all of the above answers are "NO", then may proceed with Cephalosporin use.   . Tape Rash    Family History: Family History  Problem Relation Age of Onset  . Diabetes Father   . Stroke Father   . Heart attack Father   . CAD Sister     Social History:  reports that she has been smoking cigarettes.  She has a 40.00 pack-year smoking history. She has never used smokeless tobacco. She reports that she does not drink alcohol or use drugs.  ROS: UROLOGY Frequent Urination?: Yes Hard to postpone urination?: Yes Burning/pain with urination?: Yes Get up at night to urinate?: Yes Leakage of urine?: Yes Urine stream starts and stops?: Yes Trouble starting stream?: Yes Do you have to strain to urinate?: Yes Blood in urine?: No Urinary tract infection?: Yes Sexually transmitted disease?: No Injury to kidneys or bladder?: No Painful intercourse?: No Weak stream?: No Currently pregnant?: No Vaginal bleeding?: No Last menstrual period?: n  Gastrointestinal Nausea?:  Yes Vomiting?: Yes Indigestion/heartburn?: No Diarrhea?: Yes Constipation?: Yes  Constitutional Fever: Yes Night sweats?: No Weight loss?: Yes Fatigue?: Yes  Skin Skin rash/lesions?: No Itching?: No  Eyes Blurred vision?: Yes Double vision?: Yes  Ears/Nose/Throat Sore throat?: No Sinus problems?: Yes  Hematologic/Lymphatic Swollen glands?: No Easy bruising?: Yes  Cardiovascular  Leg swelling?: Yes Chest pain?: No  Respiratory Cough?: Yes Shortness of breath?: Yes  Endocrine Excessive thirst?: Yes  Musculoskeletal Back pain?: Yes Joint pain?: Yes  Neurological Headaches?: No Dizziness?: Yes  Psychologic Depression?: No Anxiety?: No  Physical Exam: BP 113/71   Pulse (!) 102   Ht _0  (1.575 m)   Wt 111 lb 12.8 oz (50.7 kg)   BMI 20.45 kg/m   Constitutional:  Well nourished. Alert and oriented, No acute distress. HEENT:  AT, moist mucus membranes.  Trachea midline, no masses. Cardiovascular: No clubbing, cyanosis, or edema. Respiratory: Normal respiratory effort, no increased work of breathing. GI: Abdomen is soft, non tender, non distended, no abdominal masses. Liver and spleen not palpable.  No hernias appreciated.  Stool sample for occult testing is not indicated.   GU: No CVA tenderness.  No bladder fullness or masses.  Atrophic external genitalia, normal pubic hair distribution, no lesions.  Normal urethral meatus, no lesions, no prolapse, no discharge.   No urethral masses, tenderness and/or tenderness. No bladder fullness, tenderness or masses. Pale vagina mucosa, poor estrogen effect, no discharge, no lesions, poor pelvic support, Grade I cystocele.  No rectocele noted.  Cervix, uterus and adnexa are surgically absent.   Anus and perineum are without rashes or lesions.    Skin: No rashes, bruises or suspicious lesions. Lymph: No cervical or inguinal adenopathy. Neurologic: Grossly intact, no focal deficits, moving all 4  extremities. Psychiatric: Normal mood and affect.  Laboratory Data: Lab Results  Component Value Date   WBC 9.7 05/24/2017   HGB 10.1 (L) 05/24/2017   HCT 29.5 (L) 05/24/2017   MCV 93.2 05/24/2017   PLT 147 (L) 05/24/2017    Lab Results  Component Value Date   CREATININE 1.03 (H) 05/23/2017    No results found for: PSA  No results found for: TESTOSTERONE  Lab Results  Component Value Date   HGBA1C 5.8 (H) 11/29/2015    Lab Results  Component Value Date   TSH 2.307 04/16/2017       Component Value Date/Time   CHOL 181 11/27/2015 0610   HDL 49 11/27/2015 0610   CHOLHDL 3.7 11/27/2015 0610   VLDL 21 11/27/2015 0610   LDLCALC 111 (H) 11/27/2015 0610    Lab Results  Component Value Date   AST 19 05/15/2017   Lab Results  Component Value Date   ALT 14 05/15/2017   No components found for: ALKALINEPHOPHATASE No components found for: BILIRUBINTOTAL  No results found for: ESTRADIOL   Urinalysis > 30 WBC's, many bacteria and nitrite positive.  See Epic.     Assessment & Plan:   1.  Microscopic hematuria Explained to the patient that there are a number of causes that can be associated with blood in the urine, such as stones, UTI's, damage to the urinary tract and/or cancer. At this time, I felt that the patient warranted further urologic evaluation.   The AUA guidelines state that a CT urogram is the preferred imaging study to evaluate hematuria, but as she has multiple co morbidities and contrast allergies, we will schedule RUS in lieu of the CTU - she denies contrast allergies, but the ED states she is I've recommended a cystoscopy. I described how this is performed, typically in an office setting with a flexible cystoscope. We described the risks, benefits, and possible side effects, the most common of which is a minor amount of blood in the urine and/or burning which usually resolves in 24 to 48  hours UA was positive for > 30RBC's, many bacteria and  nitrites Will send for culture - will hold an antibiotic until culture has returned and will treat if positive as she is undergoing a cystoscopy  2. SUI Patient declines PT at this time due to weakness and oncological appointments Will have a trial of Myrbetriq 25 mg daily; discussed the side effects RTC in 3 three weeks for OAB questionnaire and PVR  3. Vaginal atrophy Not a candidate for vaginal estrogen cream due to her history of breast cancer  4. Asymptomatic bacteruria Patient feels her urinary symptoms are due to UTI's - it may be she has OAB with vaginal atrophy causing her symptoms - will have a trial of Myrbetriq    Return for cystoscopy for AMH.  These notes generated with voice recognition software. I apologize for typographical errors.  Zara Council, PA-C  [facility] [address] [place]  [place], [place] [address] [phone]

## 2017-06-11 ENCOUNTER — Other Ambulatory Visit: Payer: Self-pay | Admitting: Urgent Care

## 2017-06-11 ENCOUNTER — Inpatient Hospital Stay: Payer: Medicare HMO

## 2017-06-11 ENCOUNTER — Encounter: Payer: Self-pay | Admitting: Urology

## 2017-06-11 ENCOUNTER — Ambulatory Visit (INDEPENDENT_AMBULATORY_CARE_PROVIDER_SITE_OTHER): Payer: Medicare HMO | Admitting: Urology

## 2017-06-11 VITALS — BP 113/71 | HR 102 | Ht 62.0 in | Wt 111.8 lb

## 2017-06-11 DIAGNOSIS — E871 Hypo-osmolality and hyponatremia: Secondary | ICD-10-CM | POA: Diagnosis not present

## 2017-06-11 DIAGNOSIS — C3432 Malignant neoplasm of lower lobe, left bronchus or lung: Secondary | ICD-10-CM

## 2017-06-11 DIAGNOSIS — N393 Stress incontinence (female) (male): Secondary | ICD-10-CM

## 2017-06-11 DIAGNOSIS — R3129 Other microscopic hematuria: Secondary | ICD-10-CM | POA: Diagnosis not present

## 2017-06-11 DIAGNOSIS — N390 Urinary tract infection, site not specified: Secondary | ICD-10-CM | POA: Diagnosis not present

## 2017-06-11 DIAGNOSIS — J440 Chronic obstructive pulmonary disease with acute lower respiratory infection: Secondary | ICD-10-CM | POA: Diagnosis not present

## 2017-06-11 DIAGNOSIS — E538 Deficiency of other specified B group vitamins: Secondary | ICD-10-CM | POA: Diagnosis not present

## 2017-06-11 DIAGNOSIS — N952 Postmenopausal atrophic vaginitis: Secondary | ICD-10-CM

## 2017-06-11 DIAGNOSIS — Z5111 Encounter for antineoplastic chemotherapy: Secondary | ICD-10-CM | POA: Diagnosis not present

## 2017-06-11 DIAGNOSIS — R8271 Bacteriuria: Secondary | ICD-10-CM

## 2017-06-11 DIAGNOSIS — J189 Pneumonia, unspecified organism: Secondary | ICD-10-CM | POA: Diagnosis not present

## 2017-06-11 DIAGNOSIS — R0781 Pleurodynia: Secondary | ICD-10-CM | POA: Diagnosis not present

## 2017-06-11 DIAGNOSIS — D649 Anemia, unspecified: Secondary | ICD-10-CM | POA: Diagnosis not present

## 2017-06-11 LAB — URINALYSIS, COMPLETE
Bilirubin, UA: NEGATIVE
Glucose, UA: NEGATIVE
Ketones, UA: NEGATIVE
Nitrite, UA: POSITIVE — AB
Specific Gravity, UA: 1.02 (ref 1.005–1.030)
Urobilinogen, Ur: 0.2 mg/dL (ref 0.2–1.0)
pH, UA: 5.5 (ref 5.0–7.5)

## 2017-06-11 LAB — MICROSCOPIC EXAMINATION: WBC, UA: >30 /hpf — ABNORMAL HIGH (ref 0–5)

## 2017-06-11 MED ORDER — CYANOCOBALAMIN 1000 MCG/ML IJ SOLN
1000.0000 ug | Freq: Once | INTRAMUSCULAR | Status: AC
  Administered 2017-06-11: 1000 ug via INTRAMUSCULAR
  Filled 2017-06-11: qty 1

## 2017-06-11 MED ORDER — MIRABEGRON ER 25 MG PO TB24: 25.0000 mg | ORAL_TABLET | Freq: Every day | ORAL | 0 refills | Status: DC

## 2017-06-11 NOTE — Patient Instructions (Signed)
Cystoscopy  Cystoscopy is a procedure that is used to help diagnose and sometimes treat conditions that affect that lower urinary tract. The lower urinary tract includes the bladder and the tube that drains urine from the bladder out of the body (urethra). Cystoscopy is performed with a thin, tube-shaped instrument with a light and camera at the end (cystoscope). The cystoscope may be hard (rigid) or flexible, depending on the goal of the procedure.The cystoscope is inserted through the urethra, into the bladder.  Cystoscopy may be recommended if you have:   Urinary tractinfections that keep coming back (recurring).   Blood in the urine (hematuria).   Loss of bladder control (urinary incontinence) or an overactive bladder.   Unusual cells found in a urine sample.   A blockage in the urethra.   Painful urination.   An abnormality in the bladder found during an intravenous pyelogram (IVP) or CT scan.    Cystoscopy may also be done to remove a sample of tissue to be examined under a microscope (biopsy).  Tell a health care provider about:   Any allergies you have.   All medicines you are taking, including vitamins, herbs, eye drops, creams, and over-the-counter medicines.   Any problems you or family members have had with anesthetic medicines.   Any blood disorders you have.   Any surgeries you have had.   Any medical conditions you have.   Whether you are pregnant or may be pregnant.  What are the risks?  Generally, this is a safe procedure. However, problems may occur, including:   Infection.   Bleeding.   Allergic reactions to medicines.   Damage to other structures or organs.    What happens before the procedure?   Ask your health care provider about:  ? Changing or stopping your regular medicines. This is especially important if you are taking diabetes medicines or blood thinners.  ? Taking medicines such as aspirin and ibuprofen. These medicines can thin your blood. Do not take these medicines  before your procedure if your health care provider instructs you not to.   Follow instructions from your health care provider about eating or drinking restrictions.   You may be given antibiotic medicine to help prevent infection.   You may have an exam or testing, such as X-rays of the bladder, urethra, or kidneys.   You may have urine tests to check for signs of infection.   Plan to have someone take you home after the procedure.  What happens during the procedure?   To reduce your risk of infection,your health care team will wash or sanitize their hands.   You will be given one or more of the following:  ? A medicine to help you relax (sedative).  ? A medicine to numb the area (local anesthetic).   The area around the opening of your urethra will be cleaned.   The cystoscope will be passed through your urethra into your bladder.   Germ-free (sterile)fluid will flow through the cystoscope to fill your bladder. The fluid will stretch your bladder so that your surgeon can clearly examine your bladder walls.   The cystoscope will be removed and your bladder will be emptied.  The procedure may vary among health care providers and hospitals.  What happens after the procedure?   You may have some soreness or pain in your abdomen and urethra. Medicines will be available to help you.   You may have some blood in your urine.   Do not   drive for 24 hours if you received a sedative.  This information is not intended to replace advice given to you by your health care provider. Make sure you discuss any questions you have with your health care provider.  Document Released: 12/28/1999 Document Revised: 05/10/2015 Document Reviewed: 11/16/2014  Elsevier Interactive Patient Education  2018 [employer]

## 2017-06-12 ENCOUNTER — Other Ambulatory Visit: Payer: Self-pay | Admitting: Urgent Care

## 2017-06-12 ENCOUNTER — Other Ambulatory Visit: Payer: Self-pay | Admitting: *Deleted

## 2017-06-12 MED ORDER — FENTANYL 50 MCG/HR TD PT72: 50.0000 ug | MEDICATED_PATCH | TRANSDERMAL | 0 refills | Status: DC

## 2017-06-12 MED ORDER — OXYCODONE HCL 5 MG PO TABS: 5.0000 mg | ORAL_TABLET | Freq: Four times a day (QID) | ORAL | 0 refills | Status: DC | PRN

## 2017-06-12 NOTE — Telephone Encounter (Signed)
Narcotic refill request:   CSRS database reviewed prior to consideration of refills:  NARX scores --    Narcotic: 761  Sedative: 632  30 day average MME/day: 4.48  30 day average LME/day: 2.38  ORS score (range 0-999): 280  Providers outside of this practice prescribing narcotics/sedatives: YES 1. Dr. Tracie Harrier (PCP) - [facility] - filling BZO and hypnotics 2. Dr. Epifanio Lesches (hospitalist) - [facility] - short term Norco course  Given a current oncological diagnosis, this patient has the potential to experience significant cancer related pain. I called and spoke with the patient today on the phone. She is having severe pain in her back and ribs. Pain rated 8-9/10 despite Duragesic 25 mcg patches and PRN doses of Roxicodone. Transdermal analgesic dose increased over a week ago, however patient is not appreciating a great deal of improvement  Patient is not sleeping. She is in significant pain most of the waking hours. Of note, she is on concurrent BZO and hypnotic therapy, along with her current opioids.   Benefits versus risks associated with continued therapy considered. Will continue pain management with opioids. I will increase her Duragesic patch dose to 50 mcg. She can still use the Roxicodone for PRN breakthrough pain, however she should not need to take it as often as pain comes under better control with the transdermal medication. She was encouraged to maintain a pain diary for provider review. We discussed general concerns for CNS depression associated with the medications that she is taking. Patient denies increased somnolence, HYPOtension, or vertiginous symptoms. We also discussed safety concerns. She is aware that these medications could increase her overall fall risk. Patient verbalized understanding and fully accepts the risks associated with the aforementioned therapies. Patient educated that medications should not be bitten, chewed, or crushed. She should be  sure to remove, and properly dispose of patches as ordered, prior to applying a new patch. Patient verbalized understanding that medications should not be sold or shared, taken with alcohol, or used while driving.   Refill prescription sent in for: 1. Duragesic patch 50 mcg q72h (Disp #5) 2. Roxicodone 5 mg q6h PRN (Disp #30).   Honor Loh, MSN, APRN, FNP-C, CEN Oncology/Hematology Nurse Practitioner  [facility] 06/12/17, 1:42 PM

## 2017-06-13 LAB — URINE CULTURE

## 2017-06-15 ENCOUNTER — Inpatient Hospital Stay: Payer: Medicare HMO | Attending: Hematology and Oncology

## 2017-06-15 ENCOUNTER — Inpatient Hospital Stay: Payer: Medicare HMO

## 2017-06-15 ENCOUNTER — Telehealth: Payer: Self-pay | Admitting: *Deleted

## 2017-06-15 ENCOUNTER — Inpatient Hospital Stay: Payer: Medicare HMO | Admitting: Urgent Care

## 2017-06-15 ENCOUNTER — Other Ambulatory Visit: Payer: Self-pay | Admitting: Hematology and Oncology

## 2017-06-15 DIAGNOSIS — E538 Deficiency of other specified B group vitamins: Secondary | ICD-10-CM | POA: Insufficient documentation

## 2017-06-15 DIAGNOSIS — R0781 Pleurodynia: Secondary | ICD-10-CM | POA: Insufficient documentation

## 2017-06-15 DIAGNOSIS — Z9221 Personal history of antineoplastic chemotherapy: Secondary | ICD-10-CM | POA: Insufficient documentation

## 2017-06-15 DIAGNOSIS — R5383 Other fatigue: Secondary | ICD-10-CM | POA: Insufficient documentation

## 2017-06-15 DIAGNOSIS — M069 Rheumatoid arthritis, unspecified: Secondary | ICD-10-CM | POA: Insufficient documentation

## 2017-06-15 DIAGNOSIS — R05 Cough: Secondary | ICD-10-CM | POA: Insufficient documentation

## 2017-06-15 DIAGNOSIS — R0789 Other chest pain: Secondary | ICD-10-CM | POA: Insufficient documentation

## 2017-06-15 DIAGNOSIS — M549 Dorsalgia, unspecified: Secondary | ICD-10-CM | POA: Insufficient documentation

## 2017-06-15 DIAGNOSIS — F17210 Nicotine dependence, cigarettes, uncomplicated: Secondary | ICD-10-CM | POA: Insufficient documentation

## 2017-06-15 DIAGNOSIS — C3432 Malignant neoplasm of lower lobe, left bronchus or lung: Secondary | ICD-10-CM | POA: Insufficient documentation

## 2017-06-15 DIAGNOSIS — R5381 Other malaise: Secondary | ICD-10-CM | POA: Insufficient documentation

## 2017-06-15 DIAGNOSIS — N390 Urinary tract infection, site not specified: Secondary | ICD-10-CM | POA: Insufficient documentation

## 2017-06-15 DIAGNOSIS — Z9981 Dependence on supplemental oxygen: Secondary | ICD-10-CM | POA: Insufficient documentation

## 2017-06-15 DIAGNOSIS — I110 Hypertensive heart disease with heart failure: Secondary | ICD-10-CM | POA: Insufficient documentation

## 2017-06-15 DIAGNOSIS — B9629 Other Escherichia coli [E. coli] as the cause of diseases classified elsewhere: Secondary | ICD-10-CM | POA: Insufficient documentation

## 2017-06-15 DIAGNOSIS — Z87440 Personal history of urinary (tract) infections: Secondary | ICD-10-CM | POA: Insufficient documentation

## 2017-06-15 DIAGNOSIS — J449 Chronic obstructive pulmonary disease, unspecified: Secondary | ICD-10-CM | POA: Insufficient documentation

## 2017-06-15 DIAGNOSIS — Z79899 Other long term (current) drug therapy: Secondary | ICD-10-CM | POA: Insufficient documentation

## 2017-06-15 DIAGNOSIS — M797 Fibromyalgia: Secondary | ICD-10-CM | POA: Insufficient documentation

## 2017-06-15 DIAGNOSIS — I509 Heart failure, unspecified: Secondary | ICD-10-CM | POA: Insufficient documentation

## 2017-06-15 DIAGNOSIS — I700 Atherosclerosis of aorta: Secondary | ICD-10-CM | POA: Insufficient documentation

## 2017-06-15 DIAGNOSIS — R42 Dizziness and giddiness: Secondary | ICD-10-CM | POA: Insufficient documentation

## 2017-06-15 DIAGNOSIS — J9819 Other pulmonary collapse: Secondary | ICD-10-CM | POA: Insufficient documentation

## 2017-06-15 NOTE — Progress Notes (Deleted)
[facility] day:  06/15/2017    Chief Complaint: [name] [name] is a 77 y.o. 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  HPI: The patient was last seen in the medical oncology clinic on 06/04/2017.  At that time, patient was experiencing severe bilateral rib pain despite prescribed pain medication regimen. Patient taking Roxicodone 5 mg every 4 hours as needed.  She was on scheduled tramadol.  It was discovered that her Duragesic patches were discontinued while in the hospital.  Patient complained of fatigue.  Exam was stable.  Duragesic patches were restarted at 25 mcg.  Patient was scheduled to see orthopedics in follow-up on 06/09/2017.  She received #3 of 6 weekly B12 injection.   Patient was seen on follow-up consult on 06/04/2017 by Dr. Noreene Filbert (radiation oncology).  Notes reviewed. Patient's significant pain was felt to be referred to her ribs from her previous thoracic surgery.  Referral to pain management was suggested.  Dr. Donella Stade noted that there was no clear benefit of palliative radiation therapy at this time. Patient scheduled follow-up with radiation oncology in 6 months.  Patient was scheduled to see Rachelle Hora, PA-C in follow-up on 06/09/2017 (orthopedics).  I had previously spoken with PA on the phone to advise him regarding continued pain status post recent kyphoplasty on 05/22/2017.  We discussed referral to physiatry for possible intercostal nerve block versus reevaluation in orthopedic clinic.  PA felt that patient would benefit from repeat MRI imaging to evaluate for additional areas of spinal compression (fracture) that could be contributory to her pain.  Patient did not attend scheduled follow-up appointment.  Patient was seen in consult on 06/11/2017 by Zara Council, PA for her recurrent urinary tract  infection. Notes reviewed. Patient with a known ESBL producing E. Coli. Patient was seen as an inpatient by Dr. Junious Silk who felt that patient's presentation was more consistent with asymptomatic bacteriuria (colonization), given the fact that she denied urinary complaints at the time of his evaluation.  Patient noted to be overtly symptomatic in the urology clinic.  UA and culture positive for recurrent UTI (> 100,000 CFU/mL producing E.coli). Cystoscopy was recommended.  She was started on a trial of Myrbetriq 25 mg daily, with plans to follow-up with urology in 3 weeks for reassessment.  Patient is scheduled for cystoscopy on 07/22/2017 with Dr. John Giovanni.  Patient return to call to the office on 06/12/2017 to report that her pain was poorly managed on the prescribed regimen.  We had a long discussion regarding her current therapy.  She was using Duragesic 25 mcg patches, and Roxicodone 5 mg (6 pills/day).  Patient noted that this was doing little to manage her pain.  Duragesic patch dose was increased to 50 mcg.  She was to continue to use the  Roxicodone PRN.  Patient was encouraged to maintain a pain diary for provider review.  In the interim,  Past Medical History:  Diagnosis Date  . Asthma   . Breast cancer ([place]) 2009   left  . Cancer ([place])   . CHF (congestive heart failure) ([place])   . CHF (congestive heart failure) ([place])   . Chronic UTI   . COPD (chronic obstructive pulmonary disease) ([place])   . Dizziness   . Fibromyalgia   . Hypertension   . Neuropathy   . Personal history of tobacco use,  presenting hazards to health 05/17/2015  . Polyp, larynx   . RA (rheumatoid arthritis) ([place])   . Sinus infection    recent  . Stumbling gait    to the left  . Supplemental oxygen dependent    2.5l    Past Surgical History:  Procedure Laterality Date  . ABDOMINAL HYSTERECTOMY    . BREAST LUMPECTOMY Left 2009   chemo and radiation  . CYST EXCISION Left 02/27/2015   Procedure: CYST REMOVAL;   Surgeon: Hessie Knows, MD;  Location: [facility] ORS;  Service: Orthopedics;  Laterality: Left;  . EYE MUSCLE SURGERY Right    13 surgeries  . FLEXIBLE BRONCHOSCOPY N/A 07/01/2016   Procedure: FLEXIBLE BRONCHOSCOPY;  Surgeon: Wilhelmina Mcardle, MD;  Location: [facility];  Service: Pulmonary;  Laterality: N/A;  . FLEXIBLE BRONCHOSCOPY N/A 02/09/2017   Procedure: FLEXIBLE BRONCHOSCOPY;  Surgeon: [name], MD;  Location: [facility] OR[facility];  Service: Pulmonary;  Laterality: N/A;  . KYPHOPLASTY N/A 05/22/2017   Procedure: Celine Ahr;  Surgeon: Hessie Knows, MD;  Location: [facility] OR[facility];  Service: Orthopedics;  Laterality: N/A;  . PORTA CATH INSERTION N/A 03/04/2017   Procedure: PORTA CATH INSERTION;  Surgeon: [name], MD;  Location: [facility];  Service: Cardiovascular;  Laterality: N/A;  . THUMB ARTHROSCOPY Left     Family History  Problem Relation Age of Onset  . Diabetes Father   . Stroke Father   . Heart attack Father   . CAD Sister     Social History:  reports that she has been smoking cigarettes.  She has a 40.00 pack-year smoking history. She has never used smokeless tobacco. She reports that she does not drink alcohol or use drugs.  He lives with her son and grandson. The patient is alone today.   Allergies:  Allergies  Allergen Reactions  . Contrast Media [Iodinated Diagnostic Agents] Other (See Comments)    Pt was sent to the ED following contrast media injection at [place]. Unknown reason. She has been premedicated since without complications. Pt to be premedicated prior to contrast media injections  . Ioxaglate Other (See Comments)    Pt was sent to the ED following contrast media injection at [place]. Unknown reason. She has been premedicated since without complications. Pt to be premedicated prior to contrast media injections  . Sulfa Antibiotics Other (See Comments)    Unknown  . Tetracycline Hives  . White Petrolatum Other (See Comments)     Blisters  . Amoxicillin-Pot Clavulanate Rash and Other (See Comments)    Blisters in mouth Has patient had a PCN reaction causing immediate rash, facial/tongue/throat swelling, SOB or lightheadedness with hypotension: No Has patient had a PCN reaction causing severe rash involving mucus membranes or skin necrosis: No Has patient had a PCN reaction that required hospitalization: No Has patient had a PCN reaction occurring within the last 10 years: No If all of the above answers are "NO", then may proceed with Cephalosporin use.   . Tape Rash    Current Medications: Current Outpatient Medications  Medication Sig Dispense Refill  . albuterol (PROVENTIL HFA;VENTOLIN HFA) 108 (90 Base) MCG/ACT inhaler Inhale 1-2 puffs into the lungs every 6 (six) hours as needed for wheezing or shortness of breath. 1 Inhaler 2  . Calcium-Vitamin D 600-200 MG-UNIT tablet Take 1 tablet by mouth daily.     . carvedilol (COREG) 12.5 MG tablet Take 1 tablet (12.5 mg total) by mouth 2 (two) times daily with a meal. 60  tablet 0  . cholecalciferol (VITAMIN D) 1000 units tablet Take 1,000 Units by mouth daily.    . clonazePAM (KLONOPIN) 1 MG tablet Take 1 mg by mouth 2 (two) times daily as needed for anxiety.     [name] dexamethasone (DECADRON) 4 MG tablet Take 2 tablets by mouth once a day on the day after chemotherapy and then take 2 tablets two times a day for 2 days. Take with food. 30 tablet 1  . fentaNYL (DURAGESIC - DOSED MCG/HR) 50 MCG/HR Place 1 patch (50 mcg total) onto the skin every 3 (three) days. 5 patch 0  . gabapentin (NEURONTIN) 600 MG tablet Take 600 mg by mouth 3 (three) times daily.     [name] imipramine (TOFRANIL) 25 MG tablet Take 25 mg by mouth at bedtime.     . lidocaine (LIDODERM) 5 % Place 1 patch onto the skin daily. Remove & Discard patch within 12 hours or as directed by MD 30 patch 0  . lidocaine-prilocaine (EMLA) cream Apply 1 application topically as needed. 30 g 3  . LORazepam (ATIVAN) 0.5 MG  tablet Take 1 tablet (0.5 mg total) by mouth every 6 (six) hours as needed (Nausea or vomiting). 30 tablet 0  . mirabegron ER (MYRBETRIQ) 25 MG TB24 tablet Take 1 tablet (25 mg total) by mouth daily. 30 tablet 0  . Multiple Vitamin (MULTIVITAMIN) capsule Take 1 capsule by mouth daily.    . nitroGLYCERIN (NITROSTAT) 0.4 MG SL tablet Place 0.4 mg under the tongue every 5 (five) minutes as needed for chest pain.     [name] ondansetron (ZOFRAN) 8 MG tablet Take 1 tablet (8 mg total) by mouth 2 (two) times daily as needed. Start on the third day after chemotherapy. 30 tablet 1  . oxyCODONE (OXY IR/ROXICODONE) 5 MG immediate release tablet Take 1 tablet (5 mg total) by mouth every 6 (six) hours as needed for severe pain. 30 tablet 0  . Tiotropium Bromide Monohydrate (SPIRIVA RESPIMAT) 2.5 MCG/ACT AERS Inhale 2.5 mcg into the lungs 2 (two) times daily. 1 Inhaler 0  . traMADol (ULTRAM) 50 MG tablet Take 100 mg by mouth daily.     . vitamin C (ASCORBIC ACID) 500 MG tablet Take 500 mg by mouth daily.    [name] ZINC SULFATE PO Take 1 tablet by mouth daily.     [name] zolpidem (AMBIEN) 5 MG tablet Take 5 mg by mouth at bedtime.     No current facility-administered medications for this visit.    Facility-Administered Medications Ordered in Other Visits  Medication Dose Route Frequency Provider Last Rate Last Dose  . cyanocobalamin ((VITAMIN B-12)) injection 1,000 mcg  1,000 mcg Intramuscular Once Karen Kitchens, NP        Review of Systems  Constitutional: Positive for malaise/fatigue. Negative for diaphoresis, fever and weight loss.  HENT: Negative.   Eyes: Negative.   Respiratory: Positive for cough (chronic), sputum production (yellow sputum) and shortness of breath (chronic related to COPD). Negative for hemoptysis.   Cardiovascular: Positive for orthopnea. Negative for chest pain, palpitations, leg swelling and PND.  Gastrointestinal: Negative for abdominal pain, blood in stool, constipation, diarrhea, melena,  nausea and vomiting.  Genitourinary: Positive for dysuria, frequency and urgency. Negative for hematuria.       Recurrent UTI - C&S grows out ESBL producing E.coli  Musculoskeletal: Positive for back pain (chronic; kyphoplasty on 05/22/2017). Negative for falls and myalgias.       BILATERAL ribs; s/p wedge resection  Skin: Negative  for itching and rash.  Neurological: Positive for weakness. Negative for dizziness, tremors and headaches.  Endo/Heme/Allergies: Does not bruise/bleed easily.  Psychiatric/Behavioral: Negative for depression, memory loss and suicidal ideas. The patient is not nervous/anxious and does not have insomnia.   All other systems reviewed and are negative.  Performance status (ECOG): 2 - Symptomatic, <50% confined to bed  Physical Exam: There were no vitals taken for this visit.  GENERAL:  Chronically fatigued appearing thin woman sitting comfortably in the exam room in no acute distress. MENTAL STATUS:  Alert and oriented to person, place and time. HEAD:  Alopecia totalis.  Normocephalic, atraumatic, face symmetric, no Cushingoid features. EYES:  Blue eyes.  Pupils equal round and reactive to light and accomodation.  No conjunctivitis or scleral icterus. ENT:  Oropharynx clear without lesion.  Tongue normal. Mucous membranes moist.  RESPIRATORY:  Clear to auscultation without rales, wheezes or rhonchi. CARDIOVASCULAR:  Regular rate and rhythm without murmur, rub or gallop. ABDOMEN:  Soft, non-tender, with active bowel sounds, and no hepatosplenomegaly.  No masses. BACK/RIBS:  Tender to palpation mid thoracic region and right lateral ribs. SKIN:  No rashes, ulcers or lesions. EXTREMITIES: No edema, no skin discoloration or tenderness.  No palpable cords. LYMPH NODES: No palpable cervical, supraclavicular, axillary or inguinal adenopathy  NEUROLOGICAL: Unremarkable. PSYCH:  Appropriate.    No visits with results within 3 Day(s) from this visit.  Latest known visit  with results is:  Office Visit on 06/11/2017  Component Date Value Ref Range Status  . Specific Gravity, UA 06/11/2017 1.020  1.005 - 1.030 Final  . pH, UA 06/11/2017 5.5  5.0 - 7.5 Final  . Color, UA 06/11/2017 Yellow  Yellow Final  . Appearance Ur 06/11/2017 Cloudy* Clear Final  . Leukocytes, UA 06/11/2017 3+* Negative Final  . Protein, UA 06/11/2017 1+* Negative/Trace Final  . Glucose, UA 06/11/2017 Negative  Negative Final  . Ketones, UA 06/11/2017 Negative  Negative Final  . RBC, UA 06/11/2017 1+* Negative Final  . Bilirubin, UA 06/11/2017 Negative  Negative Final  . Urobilinogen, Ur 06/11/2017 0.2  0.2 - 1.0 mg/dL Final  . Nitrite, UA 06/11/2017 Positive* Negative Final  . Microscopic Examination 06/11/2017 See below:   Final  . Urine Culture, Routine 06/11/2017 Final report*  Final  . Organism ID, Bacteria 06/11/2017 Escherichia coli*  Final   Comment: Greater than 100,000 colony forming units per mL Susceptibility profile is consistent with a probable ESBL.   [name] Antimicrobial Susceptibility 06/11/2017 Comment   Final   Comment:       ** S = Susceptible; I = Intermediate; R = Resistant **                    P = Positive; N = Negative             MICS are expressed in micrograms per mL    Antibiotic                 RSLT#1    RSLT#2    RSLT#3    RSLT#4 Amoxicillin/Clavulanic Acid    S Ampicillin                     R Cefazolin                      R Cefepime  R Ceftriaxone                    R Cefuroxime                     R Ciprofloxacin                  R Ertapenem                      S Gentamicin                     S Imipenem                       S Levofloxacin                   R Meropenem                      S Nitrofurantoin                 I Piperacillin/Tazobactam        S Tetracycline                   R Tobramycin                     S Trimethoprim/Sulfa             R   . WBC, UA 06/11/2017 >30* 0 - 5 /hpf Final  . RBC, UA 06/11/2017  0-2  0 - 2 /hpf Final  . Epithelial Cells (non renal) 06/11/2017 0-10  0 - 10 /hpf Final  . Renal Epithel, UA 06/11/2017 0-10* None seen /hpf Final  . Casts 06/11/2017 Present* None seen /lpf Final  . Cast Type 06/11/2017 Hyaline casts  N/A Final  . Mucus, UA 06/11/2017 Present* Not Estab. Final  . Bacteria, UA 06/11/2017 Many* None seen/Few Final    Assessment:  [name] [name] is a 77 y.o. female with presumed metastatic squamous cell lung cancer.  She presented with clinical [id number] squamous cell lung cancer of the left lung s/p bronchoscopy and biopsy on 07/01/2016.  She has a 40 pack year smoking history.  She presented with left lower chest wall pain.  Foundation One testing on 03/13/2017 revealed no reportable alterations in EGFR, RET, ALK, MET, ERBB2, BRAF, and ROS1.    Tumor was MS-stable.  TMB was 3 Muts/Mb.  There was CDKN2A loss, CDKN2B loss, KRAS G12V, MTAP loss, and TP53 R282W.  PD-L1 was 5%.  Chest CT with contrast on 06/11/2016 revealed a 3.2 x 3.0 mass like area of focal opacity in the medial left lower lobe with obliteration of segmental airways to the anterior left lower lobe.  PET scan on 06/27/2016 revealed a 4.3 x 3.2 cm central left lower lobe lung lesion (SUV 14.3) consistent with primary bronchogenic carcinoma.  There was equivocal nodal tissue in the subcarinal station demonstrating mild hypermetabolism (SUV 3.6). There was more peripheral left lower lobe increased atelectasis with mucoid impaction which is likely secondary to endobronchial obstruction.  There was a small left pleural effusion.  Head MRI on 07/07/2016 revealed no evidence of metastatic disease.  She received radiation from 07/22/2016 - 09/19/2016.  She received 5 weeks of concurrent carboplatin and Taxol (07/24/2016 - 08/11/2016; 09/05/2016 - 09/16/2016). She requires a reduced dose of Benadryl (25 mg) for her premedication.  Week #3 was postponed secondary to chest pain and evaluation.  She missed a  couple of weeks.  Chest CT on 11/21/2016 revealed progressive collapse of the LEFT lower lobe presumably related to central obstructing mass.  Mass obstructed the LEFT lower lobe bronchus, although mass was not well demonstrated.  There were small effusions (no change).  There was centrilobular emphysema unchanged.  There was no evidence pneumonia.  PET scan on 01/27/2017 revealed interval response to therapy.  The previously noted hypermetabolism associated with left lower lobe perihilar lung mass has resolved in the interval.  There was a moderate left pleural effusion that had increased in volume from previous PET-CT and there was now complete atelectasis/consolidation of the left lower lobe, which may obscure residual mass.  There was no new sites of hypermetabolism or evidence of distant metastatic disease.  Bronchoscopy on 02/09/2017 revealed no evidence of endobronchial tumor.  There was stenosis at the entry to the left lower posterior segments which could not be entered.  There was mildly erythematous mucosa with copious secretions in the left lower lobe.  Brushings were positive for non-small cell carcinoma, favor squamous cell carcinoma.  Ultrasound guided thoracentesis on 02/17/2017 revealed was negative for malignancy.  CEA was 7.9 on 04/09/2017.  Bone scan on 02/27/2017 revealed a subtle focus of uptake in the left 7th rib, indeterminate.  Bone scan on 06/04/2017 revealed abnormal radiotracer uptake at T7 and T8 at the sites of recent kyphoplasty procedures. There was stable increased uptake in the mid lower lumbar regions, likely of arthropathic etiology. Elsewhere, the distribution of uptake is unremarkable.  She is  s/p cycle #3 carboplatin, Abraxane and pembrolizumab (03/19/2017 - 05/08/2017).  Cycle #2 was truncated secondary to pneumonia. Cycle #4 was postponed on 06/04/2017 due to severe pain.    She has a normocytic anemia likely due to chemotherapy.  Work-up on 07/24/2016 revealed  the following normal labs: ferritin (103), B12 (345), folate(19.7).  Iron saturation was 8% and TIBC was 258.  She has B12 deficiency.  B12 was 254 on 04/16/2017 and MMA 789 (high) on 04/23/2017.  She began weekly B12 on 04/30/2016 (last 05/08/2017).  She has a history of stage IA left breast cancer in 02/2005.  She underwent lumpectomy.  Pathology revealed a T1cN0 lesion.  Tumor was ER + , PR +, and Her2/neu -.  Two sentinel lymph nodes were negative.  She received chemotherapy (4 cycles of AC and possibly an abbreviated course of Taxol- no records available).  She received radiation.  She completed Femara in 08/2010.  She has fibromyalgia.  She has advanced thoracoabdominal aortic atherosclerosis with fusiform dilatation of the descending thoracic aorta up to 4.3 cm.  She is followed by Dr. Lucky Cowboy.  She has a history of recurrent UTIs.  UA on 03/19/2017 revealed  >100,000 CFU/mL of an ESBL producing E. coli.  She was treated with fosfomycin 3 g x 1 dose.  Urine culture on 05/15/2017 revealed ESBL producing E coli.  Sputum culture on 05/15/2017 revealed Corynebacterium striatum.  Chest CT angiogram on 04/30/2017 revealed no pulmonary embolism.  There was a new wedge compression fracture of T7.  She has chronic atelectasis of the left lower lobe and left sided effusion.  Thoracic spine MRI on 05/15/2017 revealed severe benign-appearing subacute compression fracture of T7 without neural impingement.  There was acute new compression fracture of the superior endplate of T8.  There was an aneurysm of the descending thoracic aorta just above the diagphragm with an adjacent small  left effusion.  She underwent T7 kyphoplasty on 05/22/2017.    Symptomatically,  she remains fatigued. She has significant pain to her back and BILATERAL ribs.  She has no known rib lesions.  Pain is poorly controlled with oxycodone 5 mg every 4 hours (6 pills/day). Duragesic patches discontinued in the hospital.   Plan: 1. Labs  today: CBC with differential, CMP, Mg 2. Review consult with radiation oncology.  No clear benefit of palliative radiation at this time. 3. Discuss follow-up consult with urology.  Cystoscopy planned for 07/22/2017. 4. Discuss follow-up consult with orthopedics.  Patient was scheduled for reevaluation and possible imaging on 06/09/2017, however patient did  go to the appointment.  Need to reschedule. 5. Discuss pain management.  Duragesic patch  dose increased to 50 mcg on 06/12/2017. She continues Roxicodone 5 mg every 4 hours PRN.  Pain is better controlled, however  remains significant. 6.   1. Discuss follow up with orthopedics for further evaluation. Spoke with Arvella Nigh, PA-C via phone. Patient to be worked into the clinic schedule on 06/09/2017 at 1415. 2. Discuss resumption of chemotherapy treatments after seen by orthopedics.  3. Continue weekly B12 x 6 (week #3 today) then monthly. 4. RTC on 06/15/17 for MD assessment, labs (CBC with diff, CMP, Mg), and +/- carboplatin, Abraxane, and pembrolizumab   Honor Loh, NP  06/15/2017, 10:50 AM

## 2017-06-15 NOTE — Telephone Encounter (Signed)
Called patient today when she did not show up for her scheduled appointment.  Patient states she has a UTI and is not feeling well. She did get her Fentanyl patches on Friday and states they have helped her pain some.  Informed patient we will reschedule her appointment.  She is in agreement.

## 2017-06-16 ENCOUNTER — Telehealth: Payer: Self-pay | Admitting: *Deleted

## 2017-06-16 DIAGNOSIS — I5022 Chronic systolic (congestive) heart failure: Secondary | ICD-10-CM | POA: Diagnosis not present

## 2017-06-16 NOTE — Telephone Encounter (Signed)
Patient request to have to CT Chest  W Contrast on 06/18/17 scheduled for 8:00 a.m time changed to the afternoon. I called Central Scheduling while patient was on hold to see if time could be changed for that same day. Time was changed to 3:00p.m Patient was made aware of the scheduled time of the appt.

## 2017-06-17 ENCOUNTER — Telehealth: Payer: Self-pay | Admitting: *Deleted

## 2017-06-17 NOTE — Telephone Encounter (Signed)
Called patient and LVM that I was calling to check on her to see if she is feeling better.  Told her I would call back. We need to reschedule her appointment from last week..  Will try again later.

## 2017-06-18 ENCOUNTER — Ambulatory Visit
Admission: RE | Admit: 2017-06-18 | Discharge: 2017-06-18 | Disposition: A | Discharge status: Home / Self Care | Payer: Medicare HMO | Source: Ambulatory Visit | Attending: Urgent Care | Admitting: Urgent Care

## 2017-06-18 ENCOUNTER — Other Ambulatory Visit: Payer: Self-pay

## 2017-06-18 ENCOUNTER — Telehealth: Payer: Self-pay | Admitting: *Deleted

## 2017-06-18 ENCOUNTER — Emergency Department
Admission: EM | Admit: 2017-06-18 | Discharge: 2017-06-18 | Disposition: A | Discharge status: Home / Self Care | Payer: Medicare HMO | Attending: Emergency Medicine | Admitting: Emergency Medicine

## 2017-06-18 ENCOUNTER — Ambulatory Visit: Payer: Medicare HMO

## 2017-06-18 ENCOUNTER — Encounter: Payer: Self-pay | Admitting: Emergency Medicine

## 2017-06-18 ENCOUNTER — Inpatient Hospital Stay: Payer: Medicare HMO | Attending: Radiation Oncology

## 2017-06-18 DIAGNOSIS — Z9221 Personal history of antineoplastic chemotherapy: Secondary | ICD-10-CM | POA: Diagnosis not present

## 2017-06-18 DIAGNOSIS — R0602 Shortness of breath: Secondary | ICD-10-CM | POA: Diagnosis not present

## 2017-06-18 DIAGNOSIS — F17210 Nicotine dependence, cigarettes, uncomplicated: Secondary | ICD-10-CM | POA: Diagnosis not present

## 2017-06-18 DIAGNOSIS — I5022 Chronic systolic (congestive) heart failure: Secondary | ICD-10-CM | POA: Insufficient documentation

## 2017-06-18 DIAGNOSIS — Z452 Encounter for adjustment and management of vascular access device: Secondary | ICD-10-CM | POA: Insufficient documentation

## 2017-06-18 DIAGNOSIS — C3432 Malignant neoplasm of lower lobe, left bronchus or lung: Secondary | ICD-10-CM | POA: Insufficient documentation

## 2017-06-18 DIAGNOSIS — J90 Pleural effusion, not elsewhere classified: Secondary | ICD-10-CM

## 2017-06-18 DIAGNOSIS — Z91041 Radiographic dye allergy status: Secondary | ICD-10-CM | POA: Insufficient documentation

## 2017-06-18 DIAGNOSIS — I700 Atherosclerosis of aorta: Secondary | ICD-10-CM | POA: Insufficient documentation

## 2017-06-18 DIAGNOSIS — Z95828 Presence of other vascular implants and grafts: Secondary | ICD-10-CM

## 2017-06-18 DIAGNOSIS — T7840XA Allergy, unspecified, initial encounter: Secondary | ICD-10-CM

## 2017-06-18 DIAGNOSIS — J432 Centrilobular emphysema: Secondary | ICD-10-CM

## 2017-06-18 DIAGNOSIS — J449 Chronic obstructive pulmonary disease, unspecified: Secondary | ICD-10-CM | POA: Diagnosis not present

## 2017-06-18 DIAGNOSIS — I2510 Atherosclerotic heart disease of native coronary artery without angina pectoris: Secondary | ICD-10-CM | POA: Insufficient documentation

## 2017-06-18 DIAGNOSIS — Z79899 Other long term (current) drug therapy: Secondary | ICD-10-CM | POA: Insufficient documentation

## 2017-06-18 DIAGNOSIS — R0902 Hypoxemia: Secondary | ICD-10-CM | POA: Diagnosis not present

## 2017-06-18 DIAGNOSIS — I110 Hypertensive heart disease with heart failure: Secondary | ICD-10-CM | POA: Diagnosis not present

## 2017-06-18 DIAGNOSIS — E538 Deficiency of other specified B group vitamins: Secondary | ICD-10-CM | POA: Diagnosis not present

## 2017-06-18 DIAGNOSIS — I712 Thoracic aortic aneurysm, without rupture: Secondary | ICD-10-CM

## 2017-06-18 LAB — BASIC METABOLIC PANEL
Anion gap: 11 (ref 5–15)
BUN: 16 mg/dL (ref 6–20)
CALCIUM: 9 mg/dL (ref 8.9–10.3)
CO2: 24 mmol/L (ref 22–32)
CREATININE: 1.45 mg/dL — AB (ref 0.44–1.00)
Chloride: 97 mmol/L — ABNORMAL LOW (ref 101–111)
GFR calc non Af Amer: 35 mL/min — ABNORMAL LOW (ref >60)
GFR, EST AFRICAN AMERICAN: 40 mL/min — AB (ref >60)
Glucose, Bld: 169 mg/dL — ABNORMAL HIGH (ref 65–99)
Potassium: 3.8 mmol/L (ref 3.5–5.1)
SODIUM: 132 mmol/L — AB (ref 135–145)

## 2017-06-18 LAB — TROPONIN I

## 2017-06-18 LAB — CBC
HCT: 38.9 % (ref 35.0–47.0)
Hemoglobin: 12.9 g/dL (ref 12.0–16.0)
MCH: 31.7 pg (ref 26.0–34.0)
MCHC: 33.2 g/dL (ref 32.0–36.0)
MCV: 95.5 fL (ref 80.0–100.0)
PLATELETS: 226 10*3/uL (ref 150–440)
RBC: 4.07 MIL/uL (ref 3.80–5.20)
RDW: 17.8 % — ABNORMAL HIGH (ref 11.5–14.5)
WBC: 7.6 10*3/uL (ref 3.6–11.0)

## 2017-06-18 MED ORDER — METHYLPREDNISOLONE SODIUM SUCC 125 MG IJ SOLR
125.0000 mg | Freq: Once | INTRAMUSCULAR | Status: AC
  Administered 2017-06-18: 125 mg via INTRAVENOUS

## 2017-06-18 MED ORDER — METHYLPREDNISOLONE SODIUM SUCC 125 MG IJ SOLR
INTRAMUSCULAR | Status: AC
  Filled 2017-06-18: qty 2

## 2017-06-18 MED ORDER — HEPARIN SOD (PORK) LOCK FLUSH 100 UNIT/ML IV SOLN
500.0000 [IU] | Freq: Once | INTRAVENOUS | Status: AC
  Administered 2017-06-18: 500 [IU] via INTRAVENOUS

## 2017-06-18 MED ORDER — CYANOCOBALAMIN 1000 MCG/ML IJ SOLN
1000.0000 ug | Freq: Once | INTRAMUSCULAR | Status: AC
  Administered 2017-06-18: 1000 ug via INTRAMUSCULAR

## 2017-06-18 MED ORDER — IOHEXOL 300 MG/ML  SOLN
75.0000 mL | Freq: Once | INTRAMUSCULAR | Status: AC | PRN
  Administered 2017-06-18: 75 mL via INTRAVENOUS

## 2017-06-18 MED ORDER — HEPARIN SOD (PORK) LOCK FLUSH 100 UNIT/ML IV SOLN: 500.0000 [IU] | Freq: Once | INTRAVENOUS | Status: AC

## 2017-06-18 MED ORDER — IOPAMIDOL (ISOVUE-300) INJECTION 61%: 75.0000 mL | Freq: Once | INTRAVENOUS | Status: DC | PRN

## 2017-06-18 MED ORDER — HEPARIN SOD (PORK) LOCK FLUSH 100 UNIT/ML IV SOLN
INTRAVENOUS | Status: AC
  Filled 2017-06-18: qty 5

## 2017-06-18 MED ORDER — IPRATROPIUM-ALBUTEROL 0.5-2.5 (3) MG/3ML IN SOLN
3.0000 mL | Freq: Once | RESPIRATORY_TRACT | Status: AC
  Administered 2017-06-18: 3 mL via RESPIRATORY_TRACT

## 2017-06-18 MED ORDER — SODIUM CHLORIDE 0.9% FLUSH
10.0000 mL | Freq: Once | INTRAVENOUS | Status: AC
  Administered 2017-06-18: 10 mL via INTRAVENOUS
  Filled 2017-06-18: qty 10

## 2017-06-18 MED ORDER — HEPARIN SOD (PORK) LOCK FLUSH 10 UNIT/ML IV SOLN: 10.0000 [IU] | Freq: Once | INTRAVENOUS | Status: DC

## 2017-06-18 MED ORDER — PREDNISONE 20 MG PO TABS: 40.0000 mg | ORAL_TABLET | Freq: Every day | ORAL | 0 refills | Status: DC

## 2017-06-18 NOTE — ED Provider Notes (Addendum)
[facility] Emergency Department Provider Note  Time seen: 4:28 PM  I have reviewed the triage vital signs and the nursing notes.   HISTORY  Chief Complaint Respiratory Distress    HPI [name] [name] is a 77 y.o. female with a past medical history of asthma, CHF, COPD, fibromyalgia, lung cancer on chemotherapy, presents to the emergency department for shortness of breath.  Patient had an outpatient CT scan with contrast ordered today.  Patient states she was feeling okay until the contrast was pushed and then became acutely short of breath and was transferred urgently to the emergency department for evaluation.  Upon arrival patient satting in the 80s on her normal 3 L of oxygen, increased to 6 L of oxygen and is satting in the upper 90s.  Patient does have diffuse wheezing but also states a history of COPD and lung cancer it is not clear if this is baseline or not.  Upon arrival high-dose Solu-Medrol, start the patient on DuoNeb breathing treatments.  Patient states she feels short of breath all the time but only became acutely worse after the contrast was injected.  Wears 3 L of oxygen 24/7.   Past Medical History:  Diagnosis Date  . Asthma   . Breast cancer ([place]) 2009   left  . Cancer ([place])   . CHF (congestive heart failure) ([place])   . CHF (congestive heart failure) ([place])   . Chronic UTI   . COPD (chronic obstructive pulmonary disease) ([place])   . Dizziness   . Fibromyalgia   . Hypertension   . Neuropathy   . Personal history of tobacco use, presenting hazards to health 05/17/2015  . Polyp, larynx   . RA (rheumatoid arthritis) ([place])   . Sinus infection    recent  . Stumbling gait    to the left  . Supplemental oxygen dependent    2.5l    Patient Active Problem List   Diagnosis Date Noted  . UTI due to extended-spectrum beta lactamase (ESBL) producing Escherichia coli 05/21/2017  . Acute midline thoracic back pain 05/09/2017  . Compression fracture of  T7 vertebra (HCC) 05/08/2017  . Rib pain on right side 05/08/2017  . Encounter for antineoplastic immunotherapy 05/08/2017  . B12 deficiency 04/29/2017  . Anemia 04/11/2017  . History of recurrent UTIs 04/11/2017  . Diarrhea 03/28/2017  . Rib lesion 03/03/2017  . Cancer ([place]) 02/16/2017  . Goals of care, counseling/discussion 02/02/2017  . Collapse of left lung 02/02/2017  . Pleural effusion, left 02/02/2017  . Pneumonia of left lower lobe due to infectious organism ([place]) 11/04/2016  . Recurrent UTI 09/08/2016  . Lung mass 09/08/2016  . Encounter for antineoplastic chemotherapy 07/24/2016  . Primary cancer of left lower lobe of lung ([name]) 06/25/2016  . Mixed hyperlipidemia 06/19/2016  . Peripheral neuropathy 06/19/2016  . Aneurysm of thoracic aorta ([place]) 04/29/2016  . Pain in limb 04/29/2016  . Acute delirium 03/06/2016  . Acute renal insufficiency 03/06/2016  . Pressure injury of skin 03/05/2016  . UTI (urinary tract infection) 03/04/2016  . Acute renal failure (ARF) ([place]) 12/04/2015  . Malnutrition of moderate degree 11/24/2015  . Hyponatremia   . COPD (chronic obstructive pulmonary disease) ([place]) 11/15/2015  . Chronic systolic CHF (congestive heart failure) ([place]) 11/15/2015  . Descending thoracic aortic aneurysm ([place]) 09/11/2015  . Personal history of tobacco use, presenting hazards to health 05/17/2015  . COPD, moderate ([place]) 05/19/2014  . Abnormal cardiovascular stress test 03/10/2014  . CAD (coronary artery  disease) 02/25/2014  . Aneurysm of abdominal vessel ([place]) 02/24/2014  . Shortness of breath 02/24/2014  . Vocal cord polyp 02/13/2014  . Narcotic drug use 01/17/2013  . Fibromyalgia 05/11/2012  . Low back pain 05/11/2012  . Arthritis 11/03/2011  . Asthma 11/03/2011  . Breast cancer ([place]) 11/03/2011  . Osteoarthritis 06/17/2011    Past Surgical History:  Procedure Laterality Date  . ABDOMINAL HYSTERECTOMY    . BREAST LUMPECTOMY Left 2009   chemo and  radiation  . CYST EXCISION Left 02/27/2015   Procedure: CYST REMOVAL;  Surgeon: Hessie Knows, MD;  Location: [facility] ORS;  Service: Orthopedics;  Laterality: Left;  . EYE MUSCLE SURGERY Right    13 surgeries  . FLEXIBLE BRONCHOSCOPY N/A 07/01/2016   Procedure: FLEXIBLE BRONCHOSCOPY;  Surgeon: Wilhelmina Mcardle, MD;  Location: [facility] ORS;  Service: Pulmonary;  Laterality: N/A;  . FLEXIBLE BRONCHOSCOPY N/A 02/09/2017   Procedure: FLEXIBLE BRONCHOSCOPY;  Surgeon: [name], MD;  Location: [facility] ORS;  Service: Pulmonary;  Laterality: N/A;  . KYPHOPLASTY N/A 05/22/2017   Procedure: Celine Ahr;  Surgeon: Hessie Knows, MD;  Location: [facility] ORS;  Service: Orthopedics;  Laterality: N/A;  . PORTA CATH INSERTION N/A 03/04/2017   Procedure: PORTA CATH INSERTION;  Surgeon: [name], MD;  Location: [place] [facility];  Service: Cardiovascular;  Laterality: N/A;  . THUMB ARTHROSCOPY Left     Prior to Admission medications   Medication Sig Start Date End Date Taking? Authorizing Provider  albuterol (PROVENTIL HFA;VENTOLIN HFA) 108 (90 Base) MCG/ACT inhaler Inhale 1-2 puffs into the lungs every 6 (six) hours as needed for wheezing or shortness of breath. 02/16/17   [name], MD  Calcium-Vitamin D 600-200 MG-UNIT tablet Take 1 tablet by mouth daily.     [provider]  carvedilol (COREG) 12.5 MG tablet Take 1 tablet (12.5 mg total) by mouth 2 (two) times daily with a meal. 11/29/15   Hugelmeyer, Alexis, DO  cholecalciferol (VITAMIN D) 1000 units tablet Take 1,000 Units by mouth daily.    [provider]  clonazePAM (KLONOPIN) 1 MG tablet Take 1 mg by mouth 2 (two) times daily as needed for anxiety.     [provider]  dexamethasone (DECADRON) 4 MG tablet Take 2 tablets by mouth once a day on the day after chemotherapy and then take 2 tablets two times a day for 2 days. Take with food. 03/19/17   Lequita Asal, MD  fentaNYL (DURAGESIC - DOSED MCG/HR) 50  MCG/HR Place 1 patch (50 mcg total) onto the skin every 3 (three) days. 06/12/17   Karen Kitchens, NP  gabapentin (NEURONTIN) 600 MG tablet Take 600 mg by mouth 3 (three) times daily.  05/25/12   [provider]  imipramine (TOFRANIL) 25 MG tablet Take 25 mg by mouth at bedtime.  09/21/15   [provider]  lidocaine (LIDODERM) 5 % Place 1 patch onto the skin daily. Remove & Discard patch within 12 hours or as directed by MD 04/27/17   Jacquelin Hawking, NP  lidocaine-prilocaine (EMLA) cream Apply 1 application topically as needed. 03/19/17   Karen Kitchens, NP  LORazepam (ATIVAN) 0.5 MG tablet Take 1 tablet (0.5 mg total) by mouth every 6 (six) hours as needed (Nausea or vomiting). 03/19/17   Lequita Asal, MD  mirabegron ER (MYRBETRIQ) 25 MG TB24 tablet Take 1 tablet (25 mg total) by mouth daily. 06/11/17   Zara Council A, PA-C  Multiple Vitamin (MULTIVITAMIN) capsule Take 1  capsule by mouth daily.    [provider]  nitroGLYCERIN (NITROSTAT) 0.4 MG SL tablet Place 0.4 mg under the tongue every 5 (five) minutes as needed for chest pain.     [provider]  ondansetron (ZOFRAN) 8 MG tablet Take 1 tablet (8 mg total) by mouth 2 (two) times daily as needed. Start on the third day after chemotherapy. 03/19/17   Lequita Asal, MD  oxyCODONE (OXY IR/ROXICODONE) 5 MG immediate release tablet Take 1 tablet (5 mg total) by mouth every 6 (six) hours as needed for severe pain. 06/12/17   Karen Kitchens, NP  Tiotropium Bromide Monohydrate (SPIRIVA RESPIMAT) 2.5 MCG/ACT AERS Inhale 2.5 mcg into the lungs 2 (two) times daily. 06/26/16   Flora Lipps, MD  traMADol (ULTRAM) 50 MG tablet Take 100 mg by mouth daily.     [provider]  vitamin C (ASCORBIC ACID) 500 MG tablet Take 500 mg by mouth daily.    [provider]  ZINC SULFATE PO Take 1 tablet by mouth daily.     [provider]  zolpidem (AMBIEN) 5 MG tablet Take 5 mg by mouth at bedtime.     [provider]    Allergies  Allergen Reactions  . Contrast Media [Iodinated Diagnostic Agents] Shortness Of Breath    Honor Loh NP states this patient is not allergic to contrast media. She will not be premedicated prior to contrast media injections.  . Sulfa Antibiotics Other (See Comments)    Unknown  . Tetracycline Hives  . White Petrolatum Other (See Comments)    Blisters  . Amoxicillin-Pot Clavulanate Rash and Other (See Comments)    Blisters in mouth Has patient had a PCN reaction causing immediate rash, facial/tongue/throat swelling, SOB or lightheadedness with hypotension: No Has patient had a PCN reaction causing severe rash involving mucus membranes or skin necrosis: No Has patient had a PCN reaction that required hospitalization: No Has patient had a PCN reaction occurring within the last 10 years: No If all of the above answers are "NO", then may proceed with Cephalosporin use.   . Tape Rash    Family History  Problem Relation Age of Onset  . Diabetes Father   . Stroke Father   . Heart attack Father   . CAD Sister     Social History Social History   Tobacco Use  . Smoking status: Current Every Day Smoker    Packs/day: 1.00    Years: 40.00    Pack years: 40.00    Types: Cigarettes  . Smokeless tobacco: Never Used  Substance Use Topics  . Alcohol use: No  . Drug use: No    Review of Systems Constitutional: Negative for fever. Eyes: Negative for visual complaints ENT: Negative for recent illness/congestion Cardiovascular: Negative for chest pain. Respiratory: Positive for shortness of breath. Gastrointestinal: Negative for abdominal pain Genitourinary: Negative for urinary compaints Musculoskeletal: Negative for musculoskeletal complaints Skin: Negative for rash/hives Neurological: Negative for headache All other ROS negative  ____________________________________________   PHYSICAL EXAM:  VITAL SIGNS: ED Triage Vitals [06/18/17  1547]  Enc Vitals Group     BP (!) 164/89     Pulse Rate (!) 130     Resp (!) 32     Temp      Temp src      SpO2 95 %     Weight      Height      Head Circumference      Peak  Flow      Pain Score 0     Pain Loc      Pain Edu?      Excl. in [place]?    Constitutional: Alert and oriented. Well appearing and in no distress. Eyes: Normal exam ENT   Head: Normocephalic and atraumatic.   Mouth/Throat: Mucous membranes are moist. Cardiovascular: Regular rhythm, rate around 120.  No obvious murmur. Respiratory: Moderate tachypnea, mild expiratory wheeze bilaterally.  No obvious rales or rhonchi. Gastrointestinal: Soft and nontender. No distention.  Musculoskeletal: Nontender with normal range of motion in all extremities.  Neurologic:  Normal speech and language. No gross focal neurologic deficits  Skin:  Skin is warm, dry and intact.  No hives or rash appreciated. Psychiatric: Mood and affect are normal.   ___________________________________________   RADIOLOGY  CT shows similar appearance to prior with known thoracic aneurysm and lower left lung collapse  ____________________________________________   INITIAL IMPRESSION / ASSESSMENT AND PLAN / ED COURSE  Pertinent labs & imaging results that were available during my care of the patient were reviewed by me and considered in my medical decision making (see chart for details).  Patient presents to the emergency department for shortness of breath after receiving IV dye.  Likely allergic reaction to the contrast.  Differential would also include COPD exacerbation, obstructive pneumonia, pneumothorax.  Given the acute onset following CT contrast administration I highly suspect allergic reaction.  Patient treated immediately with Solu-Medrol and DuoNeb's.  Within several minutes of treatment the patient states she is feeling much better.  States she feels back to her baseline.  We will decrease the patient's oxygen down to 3 L  continue to closely monitor.  We will check labs.  Patient CT scan was able to be completed.  CT does not appear to show any acute abnormality compared to prior.  Patient's labs including troponin are normal.  We will place the patient on 3 L of oxygen and closely monitor on telemetry in the emergency department.  If the patient continues to feel well and wishes to go home we will likely be able to discharge the patient.  She is agreeable to this plan of care.  Patient continues to sat well on her normal 3 L.  Patient is asking to be discharged from the hospital.  States she does not want to stay here.  States she feels normal at this time.  EKG reviewed and interpreted by myself shows sinus tachycardia at 126 bpm with a narrow QRS, normal axis, largely normal intervals with nonspecific ST changes.   ____________________________________________   FINAL CLINICAL IMPRESSION(S) / ED DIAGNOSES  Allergic reaction Hypoxia Dyspnea    Harvest Dark, MD 06/18/17 1742    Harvest Dark, MD 06/29/17 2318

## 2017-06-18 NOTE — ED Notes (Signed)
PT verbalizes d/c understanding, VSS RR even and unlabored. PT in NAD. PT on oxygen and waiting in lobby for ride. PT states she would rather wait in lobby than stay in room and wait.

## 2017-06-18 NOTE — ED Triage Notes (Signed)
Pt to ED from outpt CT where pt was having chest CT. PT became SOB after IV contrast was given. PT hx of lung cancer and COPD, wears 3L [place] chornically but dropped into 71s per CT staff. PT wheezing and tight upon arrival. Unable to speak in full sentences. MD at bedside

## 2017-06-18 NOTE — ED Notes (Signed)
Pt states she is feeling better, states she is ready to go home. RN informed pt that we need to continue to monitor. MD aware

## 2017-06-18 NOTE — ED Notes (Signed)
Pt placed back on chronic 3L [place] at this time

## 2017-06-18 NOTE — Discharge Instructions (Addendum)
Please take your steroids for the next 5 days as prescribed.  Return to the emergency department for any trouble breathing.  Please follow-up with your doctor in the next several days for recheck/reevaluation.  Please advise all physicians in the future that you are allergic to IV dye.

## 2017-06-18 NOTE — ED Notes (Signed)
2 duonebs given at this time

## 2017-06-18 NOTE — ED Notes (Signed)
Snacks and drink given at this time

## 2017-06-18 NOTE — Progress Notes (Signed)
Patient ID: [name] [name], female   DOB: [birth year], 77 y.o.   MRN: [id number]   CTSP regarding resp distress after Contrast chest CT  77 yo female with stage 3A lung ca.  Here for outpt Chest CT.  After exam pt is acutely SOB and wheezing.  VS O2 sat 90% on 6 L oxygen HR 130 BP 190/103 RR 32  EXAM  Acutely ill appearing with tachypnea   DIFFUSE BILATERAL WHEEZES   Plan:  Transport to ED for eval and management of acute resp distress.    D/w Dr Quentin Cornwall

## 2017-06-19 ENCOUNTER — Telehealth: Payer: Self-pay | Admitting: *Deleted

## 2017-06-19 NOTE — Telephone Encounter (Signed)
Patient returning Anita's phone call from Wednesday. Pt seen in ED for allergic reaction to the IV ct contrast. Calling to r/s. Myles Rosenthal to speak to patient regarding her apts.

## 2017-06-22 ENCOUNTER — Telehealth: Payer: Self-pay | Admitting: Urology

## 2017-06-22 NOTE — Telephone Encounter (Signed)
She already has a cysto app scheduled on 07-22-17 Do we need to cx the follow up with you?   Sharyn Lull

## 2017-06-22 NOTE — Telephone Encounter (Signed)
-----   Message from Nori Riis, PA-C sent at 06/22/2017  7:21 AM EDT ----- Is it possible to move this patient to Dr. Lyndal Rainbow schedule for the cystoscopy?  He saw in the hospital.

## 2017-06-23 ENCOUNTER — Ambulatory Visit
Admission: RE | Admit: 2017-06-23 | Discharge: 2017-06-23 | Disposition: A | Discharge status: Home / Self Care | Payer: Medicare HMO | Source: Ambulatory Visit | Attending: Urology | Admitting: Urology

## 2017-06-23 DIAGNOSIS — R3129 Other microscopic hematuria: Secondary | ICD-10-CM | POA: Diagnosis not present

## 2017-06-23 DIAGNOSIS — N390 Urinary tract infection, site not specified: Secondary | ICD-10-CM | POA: Diagnosis not present

## 2017-06-25 ENCOUNTER — Other Ambulatory Visit: Payer: Self-pay

## 2017-06-25 ENCOUNTER — Inpatient Hospital Stay (HOSPITAL_BASED_OUTPATIENT_CLINIC_OR_DEPARTMENT_OTHER): Payer: Medicare HMO | Admitting: Oncology

## 2017-06-25 ENCOUNTER — Ambulatory Visit: Payer: Medicare HMO

## 2017-06-25 ENCOUNTER — Inpatient Hospital Stay: Payer: Medicare HMO | Attending: Hematology and Oncology

## 2017-06-25 VITALS — BP 126/80 | HR 97 | Temp 97.6°F | Resp 20

## 2017-06-25 DIAGNOSIS — F17210 Nicotine dependence, cigarettes, uncomplicated: Secondary | ICD-10-CM

## 2017-06-25 DIAGNOSIS — J449 Chronic obstructive pulmonary disease, unspecified: Secondary | ICD-10-CM | POA: Diagnosis not present

## 2017-06-25 DIAGNOSIS — C3432 Malignant neoplasm of lower lobe, left bronchus or lung: Secondary | ICD-10-CM | POA: Diagnosis not present

## 2017-06-25 DIAGNOSIS — R5381 Other malaise: Secondary | ICD-10-CM

## 2017-06-25 DIAGNOSIS — M797 Fibromyalgia: Secondary | ICD-10-CM

## 2017-06-25 DIAGNOSIS — R5383 Other fatigue: Secondary | ICD-10-CM

## 2017-06-25 DIAGNOSIS — R0789 Other chest pain: Secondary | ICD-10-CM

## 2017-06-25 DIAGNOSIS — B9629 Other Escherichia coli [E. coli] as the cause of diseases classified elsewhere: Secondary | ICD-10-CM | POA: Diagnosis not present

## 2017-06-25 DIAGNOSIS — M549 Dorsalgia, unspecified: Secondary | ICD-10-CM | POA: Diagnosis not present

## 2017-06-25 DIAGNOSIS — R42 Dizziness and giddiness: Secondary | ICD-10-CM

## 2017-06-25 DIAGNOSIS — R05 Cough: Secondary | ICD-10-CM

## 2017-06-25 DIAGNOSIS — E538 Deficiency of other specified B group vitamins: Secondary | ICD-10-CM | POA: Diagnosis not present

## 2017-06-25 DIAGNOSIS — Z9221 Personal history of antineoplastic chemotherapy: Secondary | ICD-10-CM

## 2017-06-25 DIAGNOSIS — I700 Atherosclerosis of aorta: Secondary | ICD-10-CM | POA: Diagnosis not present

## 2017-06-25 DIAGNOSIS — Z9981 Dependence on supplemental oxygen: Secondary | ICD-10-CM | POA: Diagnosis not present

## 2017-06-25 DIAGNOSIS — M069 Rheumatoid arthritis, unspecified: Secondary | ICD-10-CM

## 2017-06-25 DIAGNOSIS — I509 Heart failure, unspecified: Secondary | ICD-10-CM

## 2017-06-25 DIAGNOSIS — I110 Hypertensive heart disease with heart failure: Secondary | ICD-10-CM

## 2017-06-25 DIAGNOSIS — N390 Urinary tract infection, site not specified: Secondary | ICD-10-CM | POA: Diagnosis not present

## 2017-06-25 DIAGNOSIS — Z87440 Personal history of urinary (tract) infections: Secondary | ICD-10-CM | POA: Diagnosis not present

## 2017-06-25 DIAGNOSIS — Z79899 Other long term (current) drug therapy: Secondary | ICD-10-CM

## 2017-06-25 DIAGNOSIS — R0781 Pleurodynia: Secondary | ICD-10-CM | POA: Diagnosis not present

## 2017-06-25 DIAGNOSIS — R079 Chest pain, unspecified: Secondary | ICD-10-CM | POA: Diagnosis not present

## 2017-06-25 DIAGNOSIS — J9819 Other pulmonary collapse: Secondary | ICD-10-CM | POA: Diagnosis not present

## 2017-06-25 LAB — CBC WITH DIFFERENTIAL/PLATELET
BASOS PCT: 1 %
Basophils Absolute: 0.1 10*3/uL (ref 0–0.1)
EOS ABS: 0.2 10*3/uL (ref 0–0.7)
EOS PCT: 2 %
HCT: 38.8 % (ref 35.0–47.0)
HEMOGLOBIN: 13 g/dL (ref 12.0–16.0)
LYMPHS ABS: 1.5 10*3/uL (ref 1.0–3.6)
Lymphocytes Relative: 14 %
MCH: 31.7 pg (ref 26.0–34.0)
MCHC: 33.5 g/dL (ref 32.0–36.0)
MCV: 94.8 fL (ref 80.0–100.0)
Monocytes Absolute: 1.1 10*3/uL — ABNORMAL HIGH (ref 0.2–0.9)
Monocytes Relative: 10 %
NEUTROS PCT: 73 %
Neutro Abs: 7.8 10*3/uL — ABNORMAL HIGH (ref 1.4–6.5)
Platelets: 215 10*3/uL (ref 150–440)
RBC: 4.09 MIL/uL (ref 3.80–5.20)
RDW: 17.2 % — ABNORMAL HIGH (ref 11.5–14.5)
WBC: 10.7 10*3/uL (ref 3.6–11.0)

## 2017-06-25 LAB — COMPREHENSIVE METABOLIC PANEL
ALT: 25 U/L (ref 14–54)
AST: 29 U/L (ref 15–41)
Albumin: 3.5 g/dL (ref 3.5–5.0)
Alkaline Phosphatase: 94 U/L (ref 38–126)
Anion gap: 10 (ref 5–15)
BUN: 22 mg/dL — ABNORMAL HIGH (ref 6–20)
CHLORIDE: 96 mmol/L — AB (ref 101–111)
CO2: 29 mmol/L (ref 22–32)
Calcium: 8.9 mg/dL (ref 8.9–10.3)
Creatinine, Ser: 1.07 mg/dL — ABNORMAL HIGH (ref 0.44–1.00)
GFR, EST AFRICAN AMERICAN: 58 mL/min — AB (ref >60)
GFR, EST NON AFRICAN AMERICAN: 50 mL/min — AB (ref >60)
Glucose, Bld: 100 mg/dL — ABNORMAL HIGH (ref 65–99)
POTASSIUM: 4.7 mmol/L (ref 3.5–5.1)
SODIUM: 135 mmol/L (ref 135–145)
Total Bilirubin: 0.4 mg/dL (ref 0.3–1.2)
Total Protein: 7 g/dL (ref 6.5–8.1)

## 2017-06-25 LAB — TROPONIN I

## 2017-06-25 MED ORDER — TRAMADOL HCL 50 MG PO TABS: 100.0000 mg | ORAL_TABLET | Freq: Every day | ORAL | 0 refills | Status: DC

## 2017-06-25 MED ORDER — CYANOCOBALAMIN 1000 MCG/ML IJ SOLN
1000.0000 ug | Freq: Once | INTRAMUSCULAR | Status: AC
  Administered 2017-06-25: 1000 ug via INTRAMUSCULAR

## 2017-06-25 NOTE — Progress Notes (Signed)
Symptom Management Consult note [facility]  Telephone:([phone] [phone]  Patient Care Team: Tracie Harrier, MD as PCP - General (Internal Medicine) Noreene Filbert, MD as Referring Physician (Radiation Oncology) Telford Nab, RN as Registered Nurse [name], MD as Consulting Physician (Pulmonary Disease) Hessie Knows, MD as Consulting Physician (Orthopedic Surgery)   Name of the patient: [name] [name]  [id number]  [birth year]   Date of visit: 06/25/17  Diagnosis-metastatic squamous cell carcinoma of the left lower lobe  Chief complaint/ Reason for visit- Chest Pain  Heme/Onc history:  Patient was last seen by primary medical oncologist Dr. Mike Gip 06/04/2017 for assessment prior to day 28 of cycle 3 carbo/Abraxane and Keytruda.  She complained of severe bilateral rib pain.  She continued PRN oxycodone as prescribed without resolution.  Duragesic patches have been discontinued while inpatient.  Duragesic patches were restarted at 25 mg/h.  Thought that rib pain originated from back and she needed to follow-up with orthopedics.  She was to return to clinic on 06/15/2017 for labs, MD assessment and carbo/Abraxane and Keytruda.  She was seen and evaluated in the emergency room on 06/18/2017 for allergic reaction to CT contrast dye.  Apparently, after contrast was pushed she became acutely short of breath and was hypoxic upon arrival to the emergency room.  Oxygen saturations were in the 80s on 3 L of oxygen.  She was given Solu-Medrol and DuoNeb breathing treatments.  She was kept for several hours on telemetry and monitored.  Labs including troponin were normal.  She was discharged home from hospital without further incident.    Oncology History   [name] [name] is a 77 y.o. female with presumed metastatic squamous cell lung cancer.  She presented with clinical T2bNxM0 squamous cell lung cancer of the left lung s/p bronchoscopy and  biopsy on 07/01/2016.  She has a 40 pack year smoking history.  She presented with left lower chest wall pain.  Foundation One testing on 03/13/2017 revealed no reportable alterations in EGFR, RET, ALK, MET, ERBB2, BRAF, and ROS1.    Tumor was MS-stable.  TMB was 3 Muts/Mb.  There was CDKN2A loss, CDKN2B loss, KRAS G12V, MTAP loss, and TP53 R282W.  PD-L1 was 5%.  Chest CT with contrast on 06/11/2016 revealed a 3.2 x 3.0 mass like area of focal opacity in the medial left lower lobe with obliteration of segmental airways to the anterior left lower lobe.  PET scan on 06/27/2016 revealed a 4.3 x 3.2 cm central left lower lobe lung lesion (SUV 14.3) consistent with primary bronchogenic carcinoma.  There was equivocal nodal tissue in the subcarinal station demonstrating mild hypermetabolism (SUV 3.6). There was more peripheral left lower lobe increased atelectasis with mucoid impaction which is likely secondary to endobronchial obstruction.  There was a small left pleural effusion.  Head MRI on 07/07/2016 revealed no evidence of metastatic disease.  She received radiation from 07/22/2016 - 09/19/2016.  She received 5 weeks of concurrent carboplatin and Taxol (07/24/2016 - 08/11/2016; 09/05/2016 - 09/16/2016). She requires a reduced dose of Benadryl (25 mg) for her premedication.  Week #3 was postponed secondary to chest pain and evaluation.  She missed a couple of weeks.  Chest CT on 11/21/2016 revealed progressive collapse of the LEFT lower lobe presumably related to central obstructing mass.  Mass obstructed the LEFT lower lobe bronchus, although mass was not well demonstrated.  There were small effusions (no change).  There was centrilobular emphysema unchanged. There was no evidence  pneumonia.  PET scan on 01/27/2017 revealed interval response to therapy.  The previously noted hypermetabolism associated with left lower lobe perihilar lung mass has resolved in the interval.  There was a moderate  left pleural effusion that had increased in volume from previous PET-CT and there was now complete atelectasis/consolidation of the left lower lobe, which may obscure residual mass.  There was no new sites of hypermetabolism or evidence of distant metastatic disease.  Bronchoscopy on 02/09/2017 revealed no evidence of endobronchial tumor.  There was stenosis at the entry to the left lower posterior segments which could not be entered.  There was mildly erythematous mucosa with copious secretions in the left lower lobe.  Brushings were positive for non-small cell carcinoma, favor squamous cell carcinoma.  Ultrasound guided thoracentesis on 02/17/2017 revealed was negative for malignancy.  CEA was 7.9 on 04/09/2017.  Bone scan on 02/27/2017 revealed a subtle focus of uptake in the left 7th rib, indeterminate.  Bone scan on 06/04/2017 revealed abnormal radiotracer uptake at T7 and T8 at the sites of recent kyphoplasty procedures. There was stable increased uptake in the mid lower lumbar regions, likely of arthropathic etiology. Elsewhere, the distribution of uptake is unremarkable.  She is day 28 s/p cycle #3 carboplatin, Abraxane and pembrolizumab (03/19/2017 - 05/08/2017).  Cycle #2 was truncated secondary to pneumonia.  She has a normocytic anemia likely due to chemotherapy.  Work-up on 07/24/2016 revealed the following normal labs: ferritin (103), B12 (345), folate(19.7).  Iron saturation was 8% and TIBC was 258.  She has B12 deficiency.  B12 was 254 on 04/16/2017 and MMA 789 (high) on 04/23/2017.  She began weekly B12 on 04/30/2016 (last 05/08/2017).  She has a history of stage IA left breast cancer in 02/2005.  She underwent lumpectomy.  Pathology revealed a T1cN0 lesion.  Tumor was ER + , PR +, and Her2/neu -.  Two sentinel lymph nodes were negative.  She received chemotherapy (4 cycles of AC and possibly an abbreviated course of Taxol- no records available).  She received radiation.  She  completed Femara in 08/2010.  She has fibromyalgia.  She has advanced thoracoabdominal aortic atherosclerosis with fusiform dilatation of the descending thoracic aorta up to 4.3 cm.  She is followed by Dr. Lucky Cowboy.  She has a history of recurrent UTIs.  UA on 03/19/2017 revealed  >100,000 CFU/mLof an ESBL producing E. coli. She was treated with fosfomycin 3 g x 1 dose.  Urine culture on 05/15/2017 revealed ESBL producing E coli.  Sputum culture on 05/15/2017 revealed Corynebacterium striatum.  Chest CT angiogram on 04/30/2017 revealed no pulmonary embolism.  There was a new wedge compression fracture of T7.  She has chronic atelectasis of the left lower lobe and left sided effusion.  Thoracic spine MRI on 05/15/2017 revealed severe benign-appearing subacute compression fracture of T7 without neural impingement.  There was acute new compression fracture of the superior endplate of T8.  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  She underwent T7 kyphoplasty on 05/22/2017.         Primary cancer of left lower lobe of lung ([place])   06/25/2016 Initial Diagnosis    Primary cancer of left lower lobe of lung ([place])      03/15/2017 -  Chemotherapy    The patient had palonosetron (ALOXI) injection 0.25 mg, 0.25 mg, Intravenous,  Once, 3 of 4 cycles Administration: 0.25 mg (03/19/2017), 0.25 mg (04/09/2017), 0.25 mg (  05/08/2017) CARBOplatin (PARAPLATIN) 230 mg in sodium chloride 0.9 % 250 mL chemo infusion, 230 mg (80 % of original dose 287 mg), Intravenous,  Once, 3 of 4 cycles Dose modification:   (original dose 287 mg, Cycle 1, Reason: Provider Judgment, Comment: assess tolerance; increase dose if possible),   (original dose 235.2 mg, Cycle 2, Reason: Provider Judgment, Comment: Patient tolerance) Administration: 230 mg (03/19/2017), 230 mg (04/09/2017), 250 mg (05/08/2017) pembrolizumab (KEYTRUDA) 200 mg in sodium chloride 0.9 % 50 mL  chemo infusion, 200 mg, Intravenous, Once, 3 of 5 cycles Administration: 200 mg (03/19/2017), 200 mg (04/09/2017), 200 mg (05/08/2017) fosaprepitant (EMEND) 150 mg, dexamethasone (DECADRON) 12 mg in sodium chloride 0.9 % 145 mL IVPB, , Intravenous,  Once, 3 of 4 cycles Administration:  (03/19/2017),  (04/09/2017),  (05/08/2017) PACLitaxel-protein bound (ABRAXANE) chemo infusion 150 mg, 100 mg/m2 = 150 mg, Intravenous, Once, 3 of 4 cycles Dose modification: 80 mg/m2 (original dose 100 mg/m2, Cycle 4, Reason: Provider Judgment, Comment: neutropenia with prior cycle; no chemo on day 15) Administration: 150 mg (03/26/2017), 150 mg (04/09/2017), 150 mg (05/08/2017), 150 mg (05/15/2017)  for chemotherapy treatment.       Interval history-  Patient complains of chest pain. Onset was 7 days ago, with worsening course since that time. The patient describes the pain as intermittent, sharp in nature, does not radiate. Patient rates pain as a 10/10 in intensity.  Associated symptoms are chest pressure/discomfort, dyspnea, fatigue, near-syncope, palpitations and tachypneaAggravating factors are coughing, exercise and walking.  Alleviating factors are: rest. Patient's cardiac risk factors are none.  Patient's risk factors for DVT/PE: none. Previous cardiac testing: CT scan , EKG and troponin. Previously negative work-up except for known lung cancer.   ECOG FS:1 - Symptomatic but completely ambulatory  Review of systems- Review of Systems  Constitutional: Positive for malaise/fatigue (Chronic). Negative for chills, fever and weight loss.  HENT: Negative for congestion and ear pain.   Eyes: Negative.  Negative for blurred vision and double vision.  Respiratory: Positive for cough, sputum production, shortness of breath and wheezing.   Cardiovascular: Positive for chest pain (Bilateral chest pain). Negative for palpitations and leg swelling.  Gastrointestinal: Negative.  Negative for abdominal pain, constipation, diarrhea,  nausea and vomiting.  Genitourinary: Negative for dysuria, frequency and urgency.  Musculoskeletal: Negative for back pain and falls.  Skin: Negative.  Negative for rash.  Neurological: Positive for dizziness. Negative for weakness and headaches.  Endo/Heme/Allergies: Negative.  Does not bruise/bleed easily.  Psychiatric/Behavioral: Negative.  Negative for depression. The patient is not nervous/anxious and does not have insomnia.      Current treatment- S/p cycle 3 carbo/Abraxane/Keytruda.  Last dose 05/15/2017.  Allergies  Allergen Reactions  . Contrast Media [Iodinated Diagnostic Agents] Shortness Of Breath    Acute onset of shortness of breath following CT contrast administration 06/18/17. Sent to the ED. Dr. Kerman Passey documented incident as an allergic reaction. Needs premedications prior to future contrast media injections. Previous questionable contrast allergy, so the patient was given entire or partial premedications without complications. She was not given any premedications on 06/18/17, resulting in shortness of breath and an ED visit.  . Sulfa Antibiotics Other (See Comments)    Unknown  . Tetracycline Hives  . White Petrolatum Other (See Comments)    Blisters  . Amoxicillin-Pot Clavulanate Rash and Other (See Comments)    Blisters in mouth Has patient had a PCN reaction causing immediate rash, facial/tongue/throat swelling, SOB or lightheadedness with hypotension: No Has patient had  a PCN reaction causing severe rash involving mucus membranes or skin necrosis: No Has patient had a PCN reaction that required hospitalization: No Has patient had a PCN reaction occurring within the last 10 years: No If all of the above answers are "NO", then may proceed with Cephalosporin use.   . Tape Rash     Past Medical History:  Diagnosis Date  . Asthma   . Breast cancer ([place]) 2009   left  . Cancer ([place])   . CHF (congestive heart failure) ([place])   . CHF (congestive heart failure) ([place])    . Chronic UTI   . COPD (chronic obstructive pulmonary disease) ([place])   . Dizziness   . Fibromyalgia   . Hypertension   . Neuropathy   . Personal history of tobacco use, presenting hazards to health 05/17/2015  . Polyp, larynx   . RA (rheumatoid arthritis) ([place])   . Sinus infection    recent  . Stumbling gait    to the left  . Supplemental oxygen dependent    2.5l     Past Surgical History:  Procedure Laterality Date  . ABDOMINAL HYSTERECTOMY    . BREAST LUMPECTOMY Left 2009   chemo and radiation  . CYST EXCISION Left 02/27/2015   Procedure: CYST REMOVAL;  Surgeon: Hessie Knows, MD;  Location: [facility] ORS;  Service: Orthopedics;  Laterality: Left;  . EYE MUSCLE SURGERY Right    13 surgeries  . FLEXIBLE BRONCHOSCOPY N/A 07/01/2016   Procedure: FLEXIBLE BRONCHOSCOPY;  Surgeon: Wilhelmina Mcardle, MD;  Location: [facility] ORS;  Service: Pulmonary;  Laterality: N/A;  . FLEXIBLE BRONCHOSCOPY N/A 02/09/2017   Procedure: FLEXIBLE BRONCHOSCOPY;  Surgeon: [name], MD;  Location: [facility] ORS;  Service: Pulmonary;  Laterality: N/A;  . KYPHOPLASTY N/A 05/22/2017   Procedure: Celine Ahr;  Surgeon: Hessie Knows, MD;  Location: [facility] ORS;  Service: Orthopedics;  Laterality: N/A;  . PORTA CATH INSERTION N/A 03/04/2017   Procedure: PORTA CATH INSERTION;  Surgeon: [name], MD;  Location: [facility];  Service: Cardiovascular;  Laterality: N/A;  . THUMB ARTHROSCOPY Left     Social History   Socioeconomic History  . Marital status: Divorced    Spouse name: Not on file  . Number of children: Not on file  . Years of education: Not on file  . Highest education level: Not on file  Occupational History  . Not on file  Social Needs  . Financial resource strain: Not on file  . Food insecurity:    Worry: Not on file    Inability: Not on file  . Transportation needs:    Medical: Not on file    Non-medical: Not on file  Tobacco Use  . Smoking status: Current Every Day  Smoker    Packs/day: 1.00    Years: 40.00    Pack years: 40.00    Types: Cigarettes  . Smokeless tobacco: Never Used  Substance and Sexual Activity  . Alcohol use: No  . Drug use: No  . Sexual activity: Not on file  Lifestyle  . Physical activity:    Days per week: Not on file    Minutes per session: Not on file  . Stress: Not on file  Relationships  . Social connections:    Talks on phone: Not on file    Gets together: Not on file    Attends religious service: Not on file    Active member of club or organization: Not on file  Attends meetings of clubs or organizations: Not on file    Relationship status: Not on file  . Intimate partner violence:    Fear of current or ex partner: Not on file    Emotionally abused: Not on file    Physically abused: Not on file    Forced sexual activity: Not on file  Other Topics Concern  . Not on file  Social History Narrative  . Not on file    Family History  Problem Relation Age of Onset  . Diabetes Father   . Stroke Father   . Heart attack Father   . CAD Sister      Current Outpatient Medications:  .  albuterol (PROVENTIL HFA;VENTOLIN HFA) 108 (90 Base) MCG/ACT inhaler, Inhale 1-2 puffs into the lungs every 6 (six) hours as needed for wheezing or shortness of breath., Disp: 1 Inhaler, Rfl: 2 .  Calcium-Vitamin D 600-200 MG-UNIT tablet, Take 1 tablet by mouth daily. , Disp: , Rfl:  .  carvedilol (COREG) 12.5 MG tablet, Take 1 tablet (12.5 mg total) by mouth 2 (two) times daily with a meal., Disp: 60 tablet, Rfl: 0 .  cholecalciferol (VITAMIN D) 1000 units tablet, Take 1,000 Units by mouth daily., Disp: , Rfl:  .  clonazePAM (KLONOPIN) 1 MG tablet, Take 1 mg by mouth 2 (two) times daily as needed for anxiety. , Disp: , Rfl:  .  fentaNYL (DURAGESIC - DOSED MCG/HR) 50 MCG/HR, Place 1 patch (50 mcg total) onto the skin every 3 (three) days., Disp: 5 patch, Rfl: 0 .  gabapentin (NEURONTIN) 600 MG tablet, Take 600 mg by mouth 3 (three)  times daily. , Disp: , Rfl:  .  imipramine (TOFRANIL) 25 MG tablet, Take 25 mg by mouth at bedtime. , Disp: , Rfl:  .  lidocaine (LIDODERM) 5 %, Place 1 patch onto the skin daily. Remove & Discard patch within 12 hours or as directed by MD, Disp: 30 patch, Rfl: 0 .  LORazepam (ATIVAN) 0.5 MG tablet, Take 1 tablet (0.5 mg total) by mouth every 6 (six) hours as needed (Nausea or vomiting)., Disp: 30 tablet, Rfl: 0 .  mirabegron ER (MYRBETRIQ) 25 MG TB24 tablet, Take 1 tablet (25 mg total) by mouth daily., Disp: 30 tablet, Rfl: 0 .  Multiple Vitamin (MULTIVITAMIN) capsule, Take 1 capsule by mouth daily., Disp: , Rfl:  .  ondansetron (ZOFRAN) 8 MG tablet, Take 1 tablet (8 mg total) by mouth 2 (two) times daily as needed. Start on the third day after chemotherapy., Disp: 30 tablet, Rfl: 1 .  oxyCODONE (OXY IR/ROXICODONE) 5 MG immediate release tablet, Take 1 tablet (5 mg total) by mouth every 6 (six) hours as needed for severe pain., Disp: 30 tablet, Rfl: 0 .  predniSONE (DELTASONE) 20 MG tablet, Take 2 tablets (40 mg total) by mouth daily., Disp: 10 tablet, Rfl: 0 .  Tiotropium Bromide Monohydrate (SPIRIVA RESPIMAT) 2.5 MCG/ACT AERS, Inhale 2.5 mcg into the lungs 2 (two) times daily., Disp: 1 Inhaler, Rfl: 0 .  traMADol (ULTRAM) 50 MG tablet, Take 2 tablets (100 mg total) by mouth daily., Disp: 30 tablet, Rfl: 0 .  vitamin C (ASCORBIC ACID) 500 MG tablet, Take 500 mg by mouth daily., Disp: , Rfl:  .  ZINC SULFATE PO, Take 1 tablet by mouth daily. , Disp: , Rfl:  .  zolpidem (AMBIEN) 5 MG tablet, Take 5 mg by mouth at bedtime., Disp: , Rfl:  .  dexamethasone (DECADRON) 4 MG tablet, Take 2 tablets  by mouth once a day on the day after chemotherapy and then take 2 tablets two times a day for 2 days. Take with food. (Patient not taking: Reported on 06/25/2017), Disp: 30 tablet, Rfl: 1 .  lidocaine-prilocaine (EMLA) cream, Apply 1 application topically as needed. (Patient not taking: Reported on 06/25/2017),  Disp: 30 g, Rfl: 3 .  nitroGLYCERIN (NITROSTAT) 0.4 MG SL tablet, Place 0.4 mg under the tongue every 5 (five) minutes as needed for chest pain. , Disp: , Rfl:  No current facility-administered medications for this visit.   Facility-Administered Medications Ordered in Other Visits:  .  cyanocobalamin ((VITAMIN B-12)) injection 1,000 mcg, 1,000 mcg, Intramuscular, Once, Honor Loh E, NP .  heparin lock flush 100 unit/mL, 500 Units, Intravenous, Once, Lequita Asal, MD  Physical exam:  Vitals:   06/25/17 1400  BP: 126/80  Pulse: 97  Resp: 20  Temp: 97.6 F (36.4 C)  TempSrc: Tympanic   Physical Exam  Constitutional: She is oriented to person, place, and time. Vital signs are normal. She appears well-developed.  HENT:  Head: Normocephalic and atraumatic.  Eyes: Pupils are equal, round, and reactive to light.  Neck: Normal range of motion.  Cardiovascular: Normal rate, regular rhythm and normal heart sounds.  No murmur heard. Pulmonary/Chest: Effort normal. She has wheezes in the left middle field and the left lower field.  Abdominal: Soft. Normal appearance and bowel sounds are normal. She exhibits no distension. There is no tenderness.  Musculoskeletal: Normal range of motion. She exhibits no edema.  Neurological: She is alert and oriented to person, place, and time.  Skin: Skin is warm and dry. No rash noted.  Psychiatric: Judgment normal.     CMP Latest Ref Rng & Units 06/18/2017  Glucose 65 - 99 mg/dL 169(H)  BUN 6 - 20 mg/dL 16  Creatinine 0.44 - 1.00 mg/dL 1.45(H)  Sodium 135 - 145 mmol/L 132(L)  Potassium 3.5 - 5.1 mmol/L 3.8  Chloride 101 - 111 mmol/L 97(L)  CO2 22 - 32 mmol/L 24  Calcium 8.9 - 10.3 mg/dL 9.0  Total Protein 6.5 - 8.1 g/dL -  Total Bilirubin 0.3 - 1.2 mg/dL -  Alkaline Phos 38 - 126 U/L -  AST 15 - 41 U/L -  ALT 14 - 54 U/L -   CBC Latest Ref Rng & Units 06/25/2017  WBC 3.6 - 11.0 K/uL 10.7  Hemoglobin 12.0 - 16.0 g/dL 13.0  Hematocrit 35.0  - 47.0 % 38.8  Platelets 150 - 440 K/uL 215    No images are attached to the encounter.  Ct Chest W Contrast  Result Date: 06/18/2017 CLINICAL DATA:  Left lower lobe lung cancer. EXAM: CT CHEST WITH CONTRAST TECHNIQUE: Multidetector CT imaging of the chest was performed during intravenous contrast administration. CONTRAST:  49m OMNIPAQUE IOHEXOL 300 MG/ML  SOLN COMPARISON:  04/30/2017 FINDINGS: Cardiovascular: The heart size is normal. No substantial pericardial effusion. Coronary artery calcification is evident. Advanced atherosclerosis of the thoracic aorta evident. Descending thoracic aorta measures up to 4.6 cm diameter on today's study. Right Port-A-Cath tip is positioned at the SVC/RA junction. Mediastinum/Nodes: No mediastinal or right hilar lymphadenopathy. Left lower lobe collapse/consolidation extends into the inferior left hilum, as before. The esophagus has normal imaging features. Surgical clips are noted in the left axilla. There is no axillary lymphadenopathy. Lungs/Pleura: Dependent mucus is identified in the trachea. Centrilobular emphysema noted bilaterally. No suspicious pulmonary nodule or mass in the right lung. Left lower lobe collapse is similar to  prior. Small left pleural effusion is stable in the interval. Upper Abdomen: Small area of low attenuation in the anterior liver, adjacent to the falciform ligament, is in a characteristic location for focal fatty deposition. Cortical scarring is noted in the kidneys bilaterally, incompletely visualized. Musculoskeletal: Evidence of prior thoracic vertebral augmentation. No worrisome lytic or sclerotic osseous abnormality. IMPRESSION: 1. Similar appearance of left lower lobe collapse with small left pleural effusion. 2. Advanced thoracic aortic atherosclerosis with aneurysmal dilatation of the descending thoracic aorta measuring up to 4.5 cm today. Continued close attention on follow-up recommended. 3.  Emphysema. (ICD10-J43.9) 4.  Aortic  Atherosclerois (ICD10-170.0) Electronically Signed   By: Misty Stanley M.D.   On: 06/18/2017 16:10   Nm Bone Scan Whole Body  Result Date: 06/04/2017 CLINICAL DATA:  Left breast carcinoma. Chronic left lower lobe collapse EXAM: NUCLEAR MEDICINE WHOLE BODY BONE SCAN TECHNIQUE: Whole body anterior and posterior images were obtained approximately 3 hours after intravenous injection of radiopharmaceutical. RADIOPHARMACEUTICALS:  22.368 mCi Technetium-76mMDP IV COMPARISON:  Bone scan February 27, 2017; chest CT April 30, 2017; intraoperative thoracic spine radiographs May 22, 2017 FINDINGS: There is focal increased radiotracer uptake in the midthoracic region at the sites of T7 and T8 compression fractures with kyphoplasty procedures. Died the equivocal uptake in the anterior left seventh rib seen on prior bone scan is not appreciable currently. Kidneys are noted in the flank positions bilaterally. IMPRESSION: Abnormal radiotracer uptake at T7 and T8 at the sites of recent kyphoplasty procedures. Stable increased uptake in the mid lower lumbar regions, likely of arthropathic etiology. Elsewhere, the distribution of uptake is unremarkable. There are no findings felt to be indicative of bony metastatic disease. Flank kidneys noted bilaterally. Electronically Signed   By: WLowella GripIII M.D.   On: 06/04/2017 13:25   [place]al  Result Date: 06/24/2017 CLINICAL DATA:  Recurrent UTIs EXAM: RENAL / URINARY TRACT ULTRASOUND COMPLETE COMPARISON:  None. FINDINGS: Right Kidney: Length: 9.6 cm, cortical thinning. Echogenicity within normal limits. No mass or hydronephrosis visualized. Left Kidney: Length: 9.1 cm, cortical thinning. Echogenicity within normal limits. No mass or hydronephrosis visualized. Bladder: Appears normal for degree of bladder distention. IMPRESSION: Cortical thinning bilaterally.  No hydronephrosis or acute findings. Electronically Signed   By: KRolm BaptiseM.D.   On: 06/24/2017 08:13      Assessment and plan- Patient is a 77y.o. female who presents for bilateral chest pain.  1.  Left lung cancer: S/p cycle 3 carbo/Abraxane/Keytruda.  Last dose 05/15/2017.  Cycle 2 was canceled secondary to pneumonia.  Patient has been admitted several times to the hospital.  Most recent was for E. coli UTI from 05/21/17-05/24/17 . Complained of cute low back pain secondary to compression fracture.  Seen in symptom management on 05/14/2017 for rib pain.  Most recently seen in emergency room for allergic reaction to CT contrast dye.  I do not see where she is scheduled to have further cycles of carbo/Abraxane/Keytruda at this time.  She is scheduled for monthly B12 injections.  Received a B12 today. RTC on 07/30/2017 for additional B12 injection.  2.  Bilateral chest pain: This appears to be a chronic problem.  Given this is acutely worse, will get stat EKG and stat labs to rule out cardiac involvement.  During assessment, patient's vital signs are stable oxygen saturations are baseline.  Heart rate 97.  She is afebrile.  EKG reveals normal sinus rhythm.  Will get stat troponin, CBC met C.  Patient  does not appear to be in distress.  So far, stat labs look good. Troponin negative.   Requesting refills on pain medication. Requesting Tramadol refill.   [place] Controlled Substance Reporting System reviewed and refill is appropriate on or after 06/10/17. Medication e-scribed to her pharmacy [facility]) using [employer] 2-step verification process.  Previously prescribed by PCP.  Unfortunately, having trouble getting this refilled.  She has an appointment on Friday, 06/26/2017 with PCP.  Will prescribe her a few days worth of tramadol to get her through.  PCP notified.  All other medications were just refilled on 06/12/2017.  NCCSRS reviewed:     3.  Recurrent UTIs: Being followed closely by Zara Council.  Recently had renal ultrasound revealing  Cortical thinning bilaterally.  No hydronephrosis or acute  findings. Currently not having symptoms of UTI.   Faythe Casa, NP 06/25/2017 4:06 PM [phone]  Visit Diagnosis 1. Other chest pain   2. Rib pain     Patient expressed understanding and was in agreement with this plan. She also understands that She can call clinic at any time with any questions, concerns, or complaints.   Greater than 50% was spent in counseling and coordination of care with this patient including but not limited to discussion of the relevant topics above (See A&P) including, but not limited to diagnosis and management of acute and chronic medical conditions.    Faythe Casa, AGNP-C [facility] [place]- [phone] Pager- [phone] 06/25/2017 3:41 PM

## 2017-06-25 NOTE — Progress Notes (Signed)
Patient presents to clinic for her b12 injection. Upon arrival, she c/o to CMA of "chest discomfort." patient states to RN - "I have rib pain on both sides. I have never had this rib pain on both sides of my body before my allergic reaction to the contrast last week. I feel like something else is going on and may be related to my contrast allergy." patient expressed the rib pain is located under axillary area on the right and left. She does not recall when she changed her Duragesic patch. She believe she is "over due for changing the patch." last oxycodone taken at 8 am today. She has not had any tramadol in 2 days as she is out. She states that her pcp has not sent her tramadol script to the pharmacy as per her request. - rates pain 10/10

## 2017-06-26 DIAGNOSIS — M797 Fibromyalgia: Secondary | ICD-10-CM | POA: Diagnosis not present

## 2017-06-28 NOTE — Progress Notes (Deleted)
07/01/2017 8:55 PM   [name] [name] [birth year] [id number]  Referring provider: Tracie Harrier, MD [address] [facility] [place], [place] [address]  No chief complaint on file.   HPI: Patient is a 77 year old Caucasian female with microscopic hematuria, asymptomatic bacteriuria, vaginal atrophy and OAB who was given a trial of Myrbetriq and presents today for follow-up.  Background history Patient is a 43 -year-old Caucasian female who presents today as a follow up for microscopic hematuria.  She was seen in consult with Dr. Junious Silk during her recent hospitalization in May 2019.  She had a history of recurrent ESBL E. coli, but she did not have symptoms of a urinary tract infection.  She was determined at that time to have asymptomatic bacteriuria and due to her neutropenia treatment was deferred to ID suggestions.  They have recommended only to treat the UTI if she becomes neutropenic and/or is having symptoms of an UTI.  She was noted to have numerous instances of microscopic hematuria.  She was having symptoms of frequent urination x q 70mnutes, strong urgency, intermittent dysuria, nocturia only when the dog wakes her up,  SUI incontinence wears two pads daily, hesitancy, intermittency, straining to urinate and a weak urinary stream.  Her UA today demonstrates >30WBC's, many bacteria and nitrite positive.  She states she only has these symptoms when she has infections, but they have been occurring on and off for the last several months.  Patient denies any gross hematuria or suprapubic/flank pain.  Patient denies any fevers, chills, nausea or vomiting.  RUS in 02/2016 noted medical renal disease and scarring.  No hydronephrosis, masses or stones were seen.  She is a smoker.   They have not worked with [employer] administrator trichloroethylene, etc.   She is drinking several 16 ounces of water daily.  She is drinking three cups of coffee in the am.    Today, she is  xperiencing urgency x *** (***), frequency x *** (***), not/is restricting fluids to avoid visits to the restroom ***, not/is engaging in toilet mapping, incontinence x *** (***) and nocturia x *** (***).  Her BP is ***.  Her PVR is  ***.   Patient denies any gross hematuria, dysuria or suprapubic/flank pain.  Patient denies any fevers, chills, nausea or vomiting.   PMH: Past Medical History:  Diagnosis Date  . Asthma   . Breast cancer ([facility] 2009   left  . Cancer ([facility]   . CHF (congestive heart failure) ([facility]   . CHF (congestive heart failure) ([facility]   . Chronic UTI   . COPD (chronic obstructive pulmonary disease) ([place]   . Dizziness   . Fibromyalgia   . Hypertension   . Neuropathy   . Personal history of tobacco use, presenting hazards to health 05/17/2015  . Polyp, larynx   . RA (rheumatoid arthritis) ([facility]   . Sinus infection    recent  . Stumbling gait    to the left  . Supplemental oxygen dependent    2.5l    Surgical History: Past Surgical History:  Procedure Laterality Date  . ABDOMINAL HYSTERECTOMY    . BREAST LUMPECTOMY Left 2009   chemo and radiation  . CYST EXCISION Left 02/27/2015   Procedure: CYST REMOVAL;  Surgeon: MHessie Knows MD;  Location: [facility] ORS;  Service: Orthopedics;  Laterality: Left;  . EYE MUSCLE SURGERY Right    13 surgeries  . FLEXIBLE BRONCHOSCOPY N/A 07/01/2016   Procedure: FLEXIBLE BRONCHOSCOPY;  Surgeon: SMerton Border  B, MD;  Location: [facility] ORS;  Service: Pulmonary;  Laterality: N/A;  . FLEXIBLE BRONCHOSCOPY N/A 02/09/2017   Procedure: FLEXIBLE BRONCHOSCOPY;  Surgeon: [name], MD;  Location: [facility] ORS;  Service: Pulmonary;  Laterality: N/A;  . KYPHOPLASTY N/A 05/22/2017   Procedure: Celine Ahr;  Surgeon: Hessie Knows, MD;  Location: [facility] ORS;  Service: Orthopedics;  Laterality: N/A;  . PORTA CATH INSERTION N/A 03/04/2017   Procedure: PORTA CATH INSERTION;  Surgeon: [name], MD;  Location: [place] [facility] LAB;  Service:  Cardiovascular;  Laterality: N/A;  . THUMB ARTHROSCOPY Left     Home Medications:  Allergies as of 07/01/2017      Reactions   Contrast Media [iodinated Diagnostic Agents] Shortness Of Breath   Acute onset of shortness of breath following CT contrast administration 06/18/17. Sent to the ED. Dr. Kerman Passey documented incident as an allergic reaction. Needs premedications prior to future contrast media injections. Previous questionable contrast allergy, so the patient was given entire or partial premedications without complications. She was not given any premedications on 06/18/17, resulting in shortness of breath and an ED visit.   Sulfa Antibiotics Other (See Comments)   Unknown   Tetracycline Hives   White Petrolatum Other (See Comments)   Blisters   Amoxicillin-pot Clavulanate Rash, Other (See Comments)   Blisters in mouth Has patient had a PCN reaction causing immediate rash, facial/tongue/throat swelling, SOB or lightheadedness with hypotension: No Has patient had a PCN reaction causing severe rash involving mucus membranes or skin necrosis: No Has patient had a PCN reaction that required hospitalization: No Has patient had a PCN reaction occurring within the last 10 years: No If all of the above answers are "NO", then may proceed with Cephalosporin use.   Tape Rash      Medication List        Accurate as of 06/28/17  8:55 PM. Always use your most recent med list.          albuterol 108 (90 Base) MCG/ACT inhaler Commonly known as:  PROVENTIL HFA;VENTOLIN HFA Inhale 1-2 puffs into the lungs every 6 (six) hours as needed for wheezing or shortness of breath.   Calcium-Vitamin D 600-200 MG-UNIT tablet Take 1 tablet by mouth daily.   carvedilol 12.5 MG tablet Commonly known as:  COREG Take 1 tablet (12.5 mg total) by mouth 2 (two) times daily with a meal.   cholecalciferol 1000 units tablet Commonly known as:  VITAMIN D Take 1,000 Units by mouth daily.   clonazePAM 1 MG  tablet Commonly known as:  KLONOPIN Take 1 mg by mouth 2 (two) times daily as needed for anxiety.   dexamethasone 4 MG tablet Commonly known as:  DECADRON Take 2 tablets by mouth once a day on the day after chemotherapy and then take 2 tablets two times a day for 2 days. Take with food.   fentaNYL 50 MCG/HR Commonly known as:  DURAGESIC - dosed mcg/hr Place 1 patch (50 mcg total) onto the skin every 3 (three) days.   gabapentin 600 MG tablet Commonly known as:  NEURONTIN Take 600 mg by mouth 3 (three) times daily.   imipramine 25 MG tablet Commonly known as:  TOFRANIL Take 25 mg by mouth at bedtime.   lidocaine 5 % Commonly known as:  LIDODERM Place 1 patch onto the skin daily. Remove & Discard patch within 12 hours or as directed by MD   lidocaine-prilocaine cream Commonly known as:  EMLA Apply 1 application topically as needed.  LORazepam 0.5 MG tablet Commonly known as:  ATIVAN Take 1 tablet (0.5 mg total) by mouth every 6 (six) hours as needed (Nausea or vomiting).   mirabegron ER 25 MG Tb24 tablet Commonly known as:  MYRBETRIQ Take 1 tablet (25 mg total) by mouth daily.   multivitamin capsule Take 1 capsule by mouth daily.   NITROSTAT 0.4 MG SL tablet Generic drug:  nitroGLYCERIN Place 0.4 mg under the tongue every 5 (five) minutes as needed for chest pain.   ondansetron 8 MG tablet Commonly known as:  ZOFRAN Take 1 tablet (8 mg total) by mouth 2 (two) times daily as needed. Start on the third day after chemotherapy.   oxyCODONE 5 MG immediate release tablet Commonly known as:  Oxy IR/ROXICODONE Take 1 tablet (5 mg total) by mouth every 6 (six) hours as needed for severe pain.   predniSONE 20 MG tablet Commonly known as:  DELTASONE Take 2 tablets (40 mg total) by mouth daily.   Tiotropium Bromide Monohydrate 2.5 MCG/ACT Aers Commonly known as:  SPIRIVA RESPIMAT Inhale 2.5 mcg into the lungs 2 (two) times daily.   traMADol 50 MG tablet Commonly known  as:  ULTRAM Take 2 tablets (100 mg total) by mouth daily.   vitamin C 500 MG tablet Commonly known as:  ASCORBIC ACID Take 500 mg by mouth daily.   ZINC SULFATE PO Take 1 tablet by mouth daily.   zolpidem 5 MG tablet Commonly known as:  AMBIEN Take 5 mg by mouth at bedtime.       Allergies:  Allergies  Allergen Reactions  . Contrast Media [Iodinated Diagnostic Agents] Shortness Of Breath    Acute onset of shortness of breath following CT contrast administration 06/18/17. Sent to the ED. Dr. Kerman Passey documented incident as an allergic reaction. Needs premedications prior to future contrast media injections. Previous questionable contrast allergy, so the patient was given entire or partial premedications without complications. She was not given any premedications on 06/18/17, resulting in shortness of breath and an ED visit.  . Sulfa Antibiotics Other (See Comments)    Unknown  . Tetracycline Hives  . White Petrolatum Other (See Comments)    Blisters  . Amoxicillin-Pot Clavulanate Rash and Other (See Comments)    Blisters in mouth Has patient had a PCN reaction causing immediate rash, facial/tongue/throat swelling, SOB or lightheadedness with hypotension: No Has patient had a PCN reaction causing severe rash involving mucus membranes or skin necrosis: No Has patient had a PCN reaction that required hospitalization: No Has patient had a PCN reaction occurring within the last 10 years: No If all of the above answers are "NO", then may proceed with Cephalosporin use.   . Tape Rash    Family History: Family History  Problem Relation Age of Onset  . Diabetes Father   . Stroke Father   . Heart attack Father   . CAD Sister     Social History:  reports that she has been smoking cigarettes.  She has a 40.00 pack-year smoking history. She has never used smokeless tobacco. She reports that she does not drink alcohol or use drugs.  ROS:                                         Physical Exam: There were no vitals taken for this visit.  Constitutional:  Well nourished. Alert and oriented, No acute distress. HEENT:  [place] AT, moist mucus membranes.  Trachea midline, no masses. Cardiovascular: No clubbing, cyanosis, or edema. Respiratory: Normal respiratory effort, no increased work of breathing. GI: Abdomen is soft, non tender, non distended, no abdominal masses. Liver and spleen not palpable.  No hernias appreciated.  Stool sample for occult testing is not indicated.   GU: No CVA tenderness.  No bladder fullness or masses.  Atrophic external genitalia, normal pubic hair distribution, no lesions.  Normal urethral meatus, no lesions, no prolapse, no discharge.   No urethral masses, tenderness and/or tenderness. No bladder fullness, tenderness or masses. Pale vagina mucosa, poor estrogen effect, no discharge, no lesions, poor pelvic support, Grade I cystocele.  No rectocele noted.  Cervix, uterus and adnexa are surgically absent.   Anus and perineum are without rashes or lesions.    Skin: No rashes, bruises or suspicious lesions. Lymph: No cervical or inguinal adenopathy. Neurologic: Grossly intact, no focal deficits, moving all 4 extremities. Psychiatric: Normal mood and affect.  ***  Constitutional: Well nourished. Alert and oriented, No acute distress. HEENT: [place] AT, moist mucus membranes. Trachea midline, no masses. Cardiovascular: No clubbing, cyanosis, or edema. Respiratory: Normal respiratory effort, no increased work of breathing. GI: Abdomen is soft, non tender, non distended, no abdominal masses. Liver and spleen not palpable.  No hernias appreciated.  Stool sample for occult testing is not indicated.   GU: No CVA tenderness.  No bladder fullness or masses.  Patient with circumcised/uncircumcised phallus. ***Foreskin easily retracted***  Urethral meatus is patent.  No penile discharge. No penile lesions or rashes. Scrotum without lesions, cysts,  rashes and/or edema.  Testicles are located scrotally bilaterally. No masses are appreciated in the testicles. Left and right epididymis are normal. Rectal: Patient with  normal sphincter tone. Anus and perineum without scarring or rashes. No rectal masses are appreciated. Prostate is approximately *** grams, *** nodules are appreciated. Seminal vesicles are normal. Skin: No rashes, bruises or suspicious lesions. Lymph: No cervical or inguinal adenopathy. Neurologic: Grossly intact, no focal deficits, moving all 4 extremities. Psychiatric: Normal mood and affect.   Laboratory Data: Lab Results  Component Value Date   WBC 10.7 06/25/2017   HGB 13.0 06/25/2017   HCT 38.8 06/25/2017   MCV 94.8 06/25/2017   PLT 215 06/25/2017    Lab Results  Component Value Date   CREATININE 1.07 (H) 06/25/2017    No results found for: PSA  No results found for: TESTOSTERONE  Lab Results  Component Value Date   HGBA1C 5.8 (H) 11/29/2015    Lab Results  Component Value Date   TSH 2.307 04/16/2017       Component Value Date/Time   CHOL 181 11/27/2015 0610   HDL 49 11/27/2015 0610   CHOLHDL 3.7 11/27/2015 0610   VLDL 21 11/27/2015 0610   LDLCALC 111 (H) 11/27/2015 0610    Lab Results  Component Value Date   AST 29 06/25/2017   Lab Results  Component Value Date   ALT 25 06/25/2017   No components found for: ALKALINEPHOPHATASE No components found for: BILIRUBINTOTAL  No results found for: ESTRADIOL  I have reviewed the labs.  Pertinent Imaging CLINICAL DATA:  Recurrent UTIs  EXAM: RENAL / URINARY TRACT ULTRASOUND COMPLETE  COMPARISON:  None.  FINDINGS: Right Kidney:  Length: 9.6 cm, cortical thinning. Echogenicity within normal limits. No mass or hydronephrosis visualized.  Left Kidney:  Length: 9.1 cm, cortical thinning. Echogenicity within normal limits. No mass or hydronephrosis visualized.  Bladder:  Appears normal  for degree of bladder  distention.  IMPRESSION: Cortical thinning bilaterally.  No hydronephrosis or acute findings.   Electronically Signed   By: Rolm Baptise M.D.   On: 06/24/2017 08:13  I have independently reviewed the films   Assessment & Plan:   1.  Microscopic hematuria Explained to the patient that there are a number of causes that can be associated with blood in the urine, such as stones, UTI's, damage to the urinary tract and/or cancer. At this time, I felt that the patient warranted further urologic evaluation.   The AUA guidelines state that a CT urogram is the preferred imaging study to evaluate hematuria, but as she has multiple co morbidities and contrast allergies, we will schedule RUS in lieu of the CTU - she denies contrast allergies, but the ED states she is I've recommended a cystoscopy. I described how this is performed, typically in an office setting with a flexible cystoscope. We described the risks, benefits, and possible side effects, the most common of which is a minor amount of blood in the urine and/or burning which usually resolves in 24 to 48 hours UA was positive for > 30RBC's, many bacteria and nitrites Will send for culture - will hold an antibiotic until culture has returned and will treat if positive as she is undergoing a cystoscopy  2. SUI Patient declines PT at this time due to weakness and oncological appointments Will have a trial of Myrbetriq 25 mg daily; discussed the side effects RTC in 3 three weeks for OAB questionnaire and PVR  3. Vaginal atrophy Not a candidate for vaginal estrogen cream due to her history of breast cancer  4. Asymptomatic bacteruria Patient feels her urinary symptoms are due to UTI's - it may be she has OAB with vaginal atrophy causing her symptoms - will have a trial of Myrbetriq    No follow-ups on file.  These notes generated with voice recognition software. I apologize for typographical errors.  Zara Council, PA-C  [facility] [address] [place]  [place], [place] [id number] [phone]

## 2017-06-29 ENCOUNTER — Other Ambulatory Visit: Payer: Self-pay | Admitting: *Deleted

## 2017-06-29 NOTE — Telephone Encounter (Addendum)
Narcotic refill request:  [place] CSRS database reviewed prior to consideration of refills:  NARX scores --    Narcotic: 793  Sedative: 621  30 day average MME/day: 34.24  30 day average LME/day:  0.59  ORS score (range 0-999): 290  Providers outside of this practice prescribing narcotics/sedatives: YES  1. Robyne Peers - PCP - [facility] (Disp #60 on 06/26/2017).    I am going to deny this request at this time. Patient should have an adequate supply of medications on hand. In the recent past, she has been prescribed the following opioids:  Medication Date Prescribed by Dispense quanity  Norco 5/325 mg  06/26/2017 Tracie Harrier, MD 60  Tramadol 50 mg 06/25/2017 Faythe Casa, AGNP 30  Roxicodone 5 mg  06/12/2017 Honor Loh, FNP 30  Duragesic 50 mcg patches 06/12/2017 Honor Loh, FNP 5   Once current supplies are exhausted, we can discuss medication refills. In the interim, she should be encouraged to follow up with orthopedics, as previously discussed, to get to the root cause of her pain. We can always consider referral to physiatry as well.   Honor Loh, MSN, APRN, FNP-C, CEN Oncology/Hematology Nurse Practitioner  [facility] 06/29/17, 5:23 PM

## 2017-06-30 ENCOUNTER — Observation Stay
Admission: EM | Admit: 2017-06-30 | Discharge: 2017-07-02 | Disposition: A | Discharge status: Home Health | Payer: Medicare HMO | Attending: Internal Medicine | Admitting: Internal Medicine

## 2017-06-30 ENCOUNTER — Encounter: Payer: Self-pay | Admitting: Emergency Medicine

## 2017-06-30 ENCOUNTER — Emergency Department: Payer: Medicare HMO

## 2017-06-30 ENCOUNTER — Other Ambulatory Visit: Payer: Self-pay

## 2017-06-30 DIAGNOSIS — G9349 Other encephalopathy: Secondary | ICD-10-CM | POA: Diagnosis not present

## 2017-06-30 DIAGNOSIS — R778 Other specified abnormalities of plasma proteins: Secondary | ICD-10-CM

## 2017-06-30 DIAGNOSIS — Z79899 Other long term (current) drug therapy: Secondary | ICD-10-CM | POA: Insufficient documentation

## 2017-06-30 DIAGNOSIS — D696 Thrombocytopenia, unspecified: Secondary | ICD-10-CM | POA: Diagnosis not present

## 2017-06-30 DIAGNOSIS — Z853 Personal history of malignant neoplasm of breast: Secondary | ICD-10-CM | POA: Diagnosis not present

## 2017-06-30 DIAGNOSIS — B9620 Unspecified Escherichia coli [E. coli] as the cause of diseases classified elsewhere: Secondary | ICD-10-CM | POA: Diagnosis not present

## 2017-06-30 DIAGNOSIS — Z833 Family history of diabetes mellitus: Secondary | ICD-10-CM | POA: Diagnosis not present

## 2017-06-30 DIAGNOSIS — Z87440 Personal history of urinary (tract) infections: Secondary | ICD-10-CM | POA: Diagnosis not present

## 2017-06-30 DIAGNOSIS — E871 Hypo-osmolality and hyponatremia: Secondary | ICD-10-CM | POA: Diagnosis not present

## 2017-06-30 DIAGNOSIS — Z8249 Family history of ischemic heart disease and other diseases of the circulatory system: Secondary | ICD-10-CM | POA: Diagnosis not present

## 2017-06-30 DIAGNOSIS — J449 Chronic obstructive pulmonary disease, unspecified: Secondary | ICD-10-CM | POA: Insufficient documentation

## 2017-06-30 DIAGNOSIS — R531 Weakness: Secondary | ICD-10-CM | POA: Diagnosis not present

## 2017-06-30 DIAGNOSIS — Z888 Allergy status to other drugs, medicaments and biological substances status: Secondary | ICD-10-CM | POA: Diagnosis not present

## 2017-06-30 DIAGNOSIS — Z882 Allergy status to sulfonamides status: Secondary | ICD-10-CM | POA: Insufficient documentation

## 2017-06-30 DIAGNOSIS — I110 Hypertensive heart disease with heart failure: Secondary | ICD-10-CM | POA: Insufficient documentation

## 2017-06-30 DIAGNOSIS — Z79891 Long term (current) use of opiate analgesic: Secondary | ICD-10-CM | POA: Insufficient documentation

## 2017-06-30 DIAGNOSIS — Z9221 Personal history of antineoplastic chemotherapy: Secondary | ICD-10-CM | POA: Diagnosis not present

## 2017-06-30 DIAGNOSIS — C3432 Malignant neoplasm of lower lobe, left bronchus or lung: Secondary | ICD-10-CM | POA: Insufficient documentation

## 2017-06-30 DIAGNOSIS — Z1612 Extended spectrum beta lactamase (ESBL) resistance: Secondary | ICD-10-CM | POA: Insufficient documentation

## 2017-06-30 DIAGNOSIS — Z881 Allergy status to other antibiotic agents status: Secondary | ICD-10-CM | POA: Diagnosis not present

## 2017-06-30 DIAGNOSIS — R7989 Other specified abnormal findings of blood chemistry: Secondary | ICD-10-CM | POA: Diagnosis not present

## 2017-06-30 DIAGNOSIS — R748 Abnormal levels of other serum enzymes: Secondary | ICD-10-CM | POA: Diagnosis present

## 2017-06-30 DIAGNOSIS — R7881 Bacteremia: Secondary | ICD-10-CM | POA: Insufficient documentation

## 2017-06-30 DIAGNOSIS — M069 Rheumatoid arthritis, unspecified: Secondary | ICD-10-CM | POA: Insufficient documentation

## 2017-06-30 DIAGNOSIS — Z880 Allergy status to penicillin: Secondary | ICD-10-CM | POA: Diagnosis not present

## 2017-06-30 DIAGNOSIS — I5022 Chronic systolic (congestive) heart failure: Secondary | ICD-10-CM | POA: Insufficient documentation

## 2017-06-30 DIAGNOSIS — F17210 Nicotine dependence, cigarettes, uncomplicated: Secondary | ICD-10-CM | POA: Diagnosis not present

## 2017-06-30 DIAGNOSIS — Z923 Personal history of irradiation: Secondary | ICD-10-CM | POA: Insufficient documentation

## 2017-06-30 DIAGNOSIS — W19XXXA Unspecified fall, initial encounter: Secondary | ICD-10-CM

## 2017-06-30 DIAGNOSIS — E222 Syndrome of inappropriate secretion of antidiuretic hormone: Secondary | ICD-10-CM | POA: Diagnosis not present

## 2017-06-30 DIAGNOSIS — N390 Urinary tract infection, site not specified: Principal | ICD-10-CM

## 2017-06-30 DIAGNOSIS — R296 Repeated falls: Secondary | ICD-10-CM | POA: Insufficient documentation

## 2017-06-30 DIAGNOSIS — R4020 Unspecified coma: Secondary | ICD-10-CM | POA: Diagnosis not present

## 2017-06-30 DIAGNOSIS — G9340 Encephalopathy, unspecified: Secondary | ICD-10-CM | POA: Diagnosis present

## 2017-06-30 DIAGNOSIS — R4182 Altered mental status, unspecified: Secondary | ICD-10-CM | POA: Diagnosis not present

## 2017-06-30 DIAGNOSIS — Z9981 Dependence on supplemental oxygen: Secondary | ICD-10-CM | POA: Diagnosis not present

## 2017-06-30 DIAGNOSIS — Z91041 Radiographic dye allergy status: Secondary | ICD-10-CM | POA: Diagnosis not present

## 2017-06-30 DIAGNOSIS — G8929 Other chronic pain: Secondary | ICD-10-CM | POA: Insufficient documentation

## 2017-06-30 DIAGNOSIS — Z823 Family history of stroke: Secondary | ICD-10-CM | POA: Insufficient documentation

## 2017-06-30 DIAGNOSIS — C3490 Malignant neoplasm of unspecified part of unspecified bronchus or lung: Secondary | ICD-10-CM | POA: Diagnosis not present

## 2017-06-30 DIAGNOSIS — R0602 Shortness of breath: Secondary | ICD-10-CM | POA: Diagnosis not present

## 2017-06-30 LAB — BLOOD CULTURE ID PANEL (REFLEXED)
Acinetobacter baumannii: NOT DETECTED
CANDIDA GLABRATA: NOT DETECTED
CANDIDA TROPICALIS: NOT DETECTED
Candida albicans: NOT DETECTED
Candida krusei: NOT DETECTED
Candida parapsilosis: NOT DETECTED
Carbapenem resistance: NOT DETECTED
ESCHERICHIA COLI: DETECTED — AB
Enterobacter cloacae complex: NOT DETECTED
Enterobacteriaceae species: DETECTED — AB
Enterococcus species: NOT DETECTED
Haemophilus influenzae: NOT DETECTED
Klebsiella oxytoca: NOT DETECTED
Klebsiella pneumoniae: NOT DETECTED
Listeria monocytogenes: NOT DETECTED
NEISSERIA MENINGITIDIS: NOT DETECTED
PROTEUS SPECIES: NOT DETECTED
Pseudomonas aeruginosa: NOT DETECTED
SERRATIA MARCESCENS: NOT DETECTED
STAPHYLOCOCCUS SPECIES: NOT DETECTED
Staphylococcus aureus (BCID): NOT DETECTED
Streptococcus agalactiae: NOT DETECTED
Streptococcus pneumoniae: NOT DETECTED
Streptococcus pyogenes: NOT DETECTED
Streptococcus species: NOT DETECTED

## 2017-06-30 LAB — URINALYSIS, COMPLETE (UACMP) WITH MICROSCOPIC
BILIRUBIN URINE: NEGATIVE
Glucose, UA: NEGATIVE mg/dL
Ketones, ur: NEGATIVE mg/dL
Nitrite: POSITIVE — AB
PH: 6 (ref 5.0–8.0)
Protein, ur: 30 mg/dL — AB
SQUAMOUS EPITHELIAL / LPF: NONE SEEN (ref 0–5)
Specific Gravity, Urine: 1.009 (ref 1.005–1.030)

## 2017-06-30 LAB — COMPREHENSIVE METABOLIC PANEL
ALBUMIN: 3.2 g/dL — AB (ref 3.5–5.0)
ALT: 13 U/L — ABNORMAL LOW (ref 14–54)
AST: 23 U/L (ref 15–41)
Alkaline Phosphatase: 95 U/L (ref 38–126)
Anion gap: 9 (ref 5–15)
BUN: 20 mg/dL (ref 6–20)
CHLORIDE: 92 mmol/L — AB (ref 101–111)
CO2: 29 mmol/L (ref 22–32)
Calcium: 8.5 mg/dL — ABNORMAL LOW (ref 8.9–10.3)
Creatinine, Ser: 1.17 mg/dL — ABNORMAL HIGH (ref 0.44–1.00)
GFR calc Af Amer: 52 mL/min — ABNORMAL LOW (ref >60)
GFR calc non Af Amer: 45 mL/min — ABNORMAL LOW (ref >60)
GLUCOSE: 102 mg/dL — AB (ref 65–99)
POTASSIUM: 4.9 mmol/L (ref 3.5–5.1)
Sodium: 130 mmol/L — ABNORMAL LOW (ref 135–145)
Total Bilirubin: 1 mg/dL (ref 0.3–1.2)
Total Protein: 6.9 g/dL (ref 6.5–8.1)

## 2017-06-30 LAB — CBC WITH DIFFERENTIAL/PLATELET
BASOS ABS: 0.1 10*3/uL (ref 0–0.1)
BASOS PCT: 1 %
EOS PCT: 0 %
Eosinophils Absolute: 0 10*3/uL (ref 0–0.7)
HCT: 33 % — ABNORMAL LOW (ref 35.0–47.0)
Hemoglobin: 11.1 g/dL — ABNORMAL LOW (ref 12.0–16.0)
Lymphocytes Relative: 2 %
Lymphs Abs: 0.3 10*3/uL — ABNORMAL LOW (ref 1.0–3.6)
MCH: 31.6 pg (ref 26.0–34.0)
MCHC: 33.6 g/dL (ref 32.0–36.0)
MCV: 93.9 fL (ref 80.0–100.0)
MONO ABS: 1.2 10*3/uL — AB (ref 0.2–0.9)
Monocytes Relative: 10 %
NEUTROS ABS: 10.7 10*3/uL — AB (ref 1.4–6.5)
Neutrophils Relative %: 87 %
PLATELETS: 129 10*3/uL — AB (ref 150–440)
RBC: 3.52 MIL/uL — AB (ref 3.80–5.20)
RDW: 16.7 % — AB (ref 11.5–14.5)
WBC: 12.3 10*3/uL — AB (ref 3.6–11.0)

## 2017-06-30 LAB — BLOOD GAS, VENOUS
Acid-Base Excess: 6.8 mmol/L — ABNORMAL HIGH (ref 0.0–2.0)
BICARBONATE: 33.9 mmol/L — AB (ref 20.0–28.0)
PATIENT TEMPERATURE: 37
PCO2 VEN: 60 mmHg (ref 44.0–60.0)
pH, Ven: 7.36 (ref 7.250–7.430)

## 2017-06-30 LAB — LACTIC ACID, PLASMA: Lactic Acid, Venous: 1.4 mmol/L (ref 0.5–1.9)

## 2017-06-30 LAB — TROPONIN I: Troponin I: 0.09 ng/mL (ref <0.03)

## 2017-06-30 MED ORDER — TIOTROPIUM BROMIDE MONOHYDRATE 18 MCG IN CAPS
1.0000 | ORAL_CAPSULE | Freq: Two times a day (BID) | RESPIRATORY_TRACT | Status: DC
  Administered 2017-07-01 – 2017-07-02 (×2): 18 ug via RESPIRATORY_TRACT
  Filled 2017-06-30: qty 5

## 2017-06-30 MED ORDER — VITAMIN D 1000 UNITS PO TABS
1000.0000 [IU] | ORAL_TABLET | Freq: Every day | ORAL | Status: DC
  Administered 2017-06-30 – 2017-07-02 (×3): 1000 [IU] via ORAL
  Filled 2017-06-30 (×3): qty 1

## 2017-06-30 MED ORDER — MIRABEGRON ER 25 MG PO TB24
25.0000 mg | ORAL_TABLET | Freq: Every day | ORAL | Status: DC
  Administered 2017-06-30 – 2017-07-02 (×3): 25 mg via ORAL
  Filled 2017-06-30 (×3): qty 1

## 2017-06-30 MED ORDER — OXYCODONE HCL 5 MG PO TABS: 5.0000 mg | ORAL_TABLET | Freq: Four times a day (QID) | ORAL | Status: DC | PRN

## 2017-06-30 MED ORDER — SODIUM CHLORIDE 0.9 % IV SOLN
1.0000 g | Freq: Two times a day (BID) | INTRAVENOUS | Status: DC
  Administered 2017-06-30 – 2017-07-01 (×3): 1 g via INTRAVENOUS
  Filled 2017-06-30 (×5): qty 1

## 2017-06-30 MED ORDER — ALBUTEROL SULFATE (2.5 MG/3ML) 0.083% IN NEBU: 2.5000 mg | INHALATION_SOLUTION | Freq: Four times a day (QID) | RESPIRATORY_TRACT | Status: DC | PRN

## 2017-06-30 MED ORDER — VITAMIN C 500 MG PO TABS
500.0000 mg | ORAL_TABLET | Freq: Every day | ORAL | Status: DC
  Administered 2017-06-30 – 2017-07-01 (×2): 500 mg via ORAL
  Filled 2017-06-30 (×4): qty 1

## 2017-06-30 MED ORDER — CEFTRIAXONE SODIUM 1 G IJ SOLR
1.0000 g | INTRAMUSCULAR | Status: DC
  Administered 2017-06-30: 1 g via INTRAVENOUS
  Filled 2017-06-30 (×2): qty 10

## 2017-06-30 MED ORDER — ONDANSETRON HCL 4 MG PO TABS: 4.0000 mg | ORAL_TABLET | Freq: Four times a day (QID) | ORAL | Status: DC | PRN

## 2017-06-30 MED ORDER — FENTANYL 50 MCG/HR TD PT72
50.0000 ug | MEDICATED_PATCH | TRANSDERMAL | Status: DC
  Administered 2017-07-02: 50 ug via TRANSDERMAL
  Filled 2017-06-30: qty 1

## 2017-06-30 MED ORDER — ACETAMINOPHEN 650 MG RE SUPP: 650.0000 mg | Freq: Four times a day (QID) | RECTAL | Status: DC | PRN

## 2017-06-30 MED ORDER — IMIPRAMINE HCL 25 MG PO TABS
25.0000 mg | ORAL_TABLET | Freq: Every day | ORAL | Status: DC
  Administered 2017-06-30 – 2017-07-01 (×2): 25 mg via ORAL
  Filled 2017-06-30 (×3): qty 1

## 2017-06-30 MED ORDER — ZINC SULFATE 220 (50 ZN) MG PO CAPS
220.0000 mg | ORAL_CAPSULE | Freq: Every day | ORAL | Status: DC
  Administered 2017-06-30 – 2017-07-02 (×3): 220 mg via ORAL
  Filled 2017-06-30 (×3): qty 1

## 2017-06-30 MED ORDER — NITROGLYCERIN 0.4 MG SL SUBL: 0.4000 mg | SUBLINGUAL_TABLET | SUBLINGUAL | Status: DC | PRN

## 2017-06-30 MED ORDER — ACETAMINOPHEN 325 MG PO TABS
650.0000 mg | ORAL_TABLET | Freq: Four times a day (QID) | ORAL | Status: DC | PRN
  Administered 2017-06-30: 19:00:00 650 mg via ORAL
  Filled 2017-06-30: qty 2

## 2017-06-30 MED ORDER — ADULT MULTIVITAMIN W/MINERALS CH
1.0000 | ORAL_TABLET | Freq: Every day | ORAL | Status: DC
  Administered 2017-06-30 – 2017-07-02 (×3): 1 via ORAL
  Filled 2017-06-30 (×3): qty 1

## 2017-06-30 MED ORDER — ONDANSETRON HCL 4 MG/2ML IJ SOLN
4.0000 mg | Freq: Four times a day (QID) | INTRAMUSCULAR | Status: DC | PRN
  Administered 2017-06-30: 4 mg via INTRAVENOUS
  Filled 2017-06-30: qty 2

## 2017-06-30 MED ORDER — HYDROCODONE-ACETAMINOPHEN 5-325 MG PO TABS
1.0000 | ORAL_TABLET | Freq: Two times a day (BID) | ORAL | Status: DC | PRN
  Administered 2017-07-01 – 2017-07-02 (×2): 1 via ORAL
  Filled 2017-06-30 (×2): qty 1

## 2017-06-30 MED ORDER — POLYETHYLENE GLYCOL 3350 17 G PO PACK: 17.0000 g | PACK | Freq: Every day | ORAL | Status: DC | PRN

## 2017-06-30 MED ORDER — PIPERACILLIN-TAZOBACTAM 3.375 G IVPB 30 MIN
3.3750 g | Freq: Once | INTRAVENOUS | Status: AC
  Administered 2017-06-30: 3.375 g via INTRAVENOUS
  Filled 2017-06-30: qty 50

## 2017-06-30 MED ORDER — CALCIUM CARBONATE-VITAMIN D 500-200 MG-UNIT PO TABS
1.0000 | ORAL_TABLET | Freq: Every day | ORAL | Status: DC
  Administered 2017-06-30 – 2017-07-02 (×3): 1 via ORAL
  Filled 2017-06-30 (×3): qty 1

## 2017-06-30 MED ORDER — ZOLPIDEM TARTRATE 5 MG PO TABS
5.0000 mg | ORAL_TABLET | Freq: Every day | ORAL | Status: DC
  Administered 2017-06-30 – 2017-07-01 (×2): 5 mg via ORAL
  Filled 2017-06-30 (×2): qty 1

## 2017-06-30 MED ORDER — SENNA 8.6 MG PO TABS
1.0000 | ORAL_TABLET | Freq: Two times a day (BID) | ORAL | Status: DC
  Administered 2017-07-01 – 2017-07-02 (×2): 8.6 mg via ORAL
  Filled 2017-06-30 (×3): qty 1

## 2017-06-30 MED ORDER — CLONAZEPAM 0.5 MG PO TABS
1.0000 mg | ORAL_TABLET | Freq: Two times a day (BID) | ORAL | Status: DC | PRN
Start: 2017-06-30 — End: 2017-07-02
  Administered 2017-07-01: 03:00:00 1 mg via ORAL
  Filled 2017-06-30: qty 2

## 2017-06-30 MED ORDER — GABAPENTIN 600 MG PO TABS
600.0000 mg | ORAL_TABLET | Freq: Three times a day (TID) | ORAL | Status: DC
  Administered 2017-06-30 – 2017-07-02 (×7): 600 mg via ORAL
  Filled 2017-06-30 (×7): qty 1

## 2017-06-30 MED ORDER — LORAZEPAM 0.5 MG PO TABS: 0.5000 mg | ORAL_TABLET | Freq: Four times a day (QID) | ORAL | Status: DC | PRN

## 2017-06-30 MED ORDER — VANCOMYCIN HCL IN DEXTROSE 1-5 GM/200ML-% IV SOLN
1000.0000 mg | Freq: Once | INTRAVENOUS | Status: AC
  Administered 2017-06-30: 1000 mg via INTRAVENOUS
  Filled 2017-06-30: qty 200

## 2017-06-30 MED ORDER — SODIUM CHLORIDE 0.9 % IV SOLN
Freq: Once | INTRAVENOUS | Status: AC
  Administered 2017-06-30: 19:00:00 via INTRAVENOUS

## 2017-06-30 MED ORDER — CARVEDILOL 3.125 MG PO TABS
12.5000 mg | ORAL_TABLET | Freq: Two times a day (BID) | ORAL | Status: DC
  Administered 2017-06-30 – 2017-07-02 (×4): 12.5 mg via ORAL
  Filled 2017-06-30 (×4): qty 4

## 2017-06-30 NOTE — ED Notes (Addendum)
Attempted to call report x 1  

## 2017-06-30 NOTE — ED Notes (Signed)
Pt denies any needs at this time.

## 2017-06-30 NOTE — Care Management Note (Signed)
Case Management Note  Patient Details  Name: NANETTE WIRSING MRN: [id number] Date of Birth: [birth year]  Subjective/Objective:                  RNCM consult for home health services. [facility] has used [facility] in the past however she does not want it now. She states she lives with her son Legrand Como, not her grandson. She states he told her she fell but she thinks she passed out on the toilet.  She does not remember falling. She was able to tell me Dr. Ginette Pitman was her doctor, that she was in the hospital because he "fell",  and that she lives in [place]. She is a current patient of [facility]. MOON explained. She said that some of her medications are quite expensive and hopes that she will be home when they are due. She anticipates going home tomorrow.   Action/Plan:  Home health list provided. Referral to [facility] in case patient changes her mind as she said she liked them for home health.   Expected Discharge Date:                  Expected Discharge Plan:     In-House Referral:     Discharge planning Services  CM Consult  Post Acute Care Choice:  Home Health Choice offered to:  Patient  DME Arranged:    DME Agency:     HH Arranged:    [place] Agency:  [place]  Status of Service:  In process, will continue to follow  If discussed at Long Length of Stay Meetings, dates discussed:    Additional Comments:  Marshell Garfinkel, RN 06/30/2017, 3:50 PM

## 2017-06-30 NOTE — ED Triage Notes (Signed)
Pt presents to ED via [facility] with c/o fall and AMS. EMS reports patient lives at home with her grandson who is primary caregiver. Pt has hx of lung cancer. EMS reports en route temp of 100.2, states found patient sitting in recliner and had urinated on herself. Per EMS pt denies falling, was altered on scene, answering questions inappropriately and denying falling to EMS.

## 2017-06-30 NOTE — H&P (Signed)
[facility] NAME: [name] [name]    MR#:  [id number]  DATE OF BIRTH:  Nov 23, 1940  DATE OF ADMISSION:  06/30/2017  PRIMARY CARE PHYSICIAN: Tracie Harrier, MD   REQUESTING/REFERRING PHYSICIAN: Dr. Jimmye Norman  CHIEF COMPLAINT:   Fall and confusion No family in the ER HISTORY OF PRESENT ILLNESS:  [name] [name]  is a 77 y.o. female with a known history of metastatic squamous cell carcinoma of the left lower lobe undergoing chemo and radiation at the cancer center, chronic chest pain, COPD on chronic home oxygen comes to the emergency room with confusion and had a fall at home. Lives with grandson and son at home. She states she slid off and had a mechanical fall at home no injury reported. She was brought to the emergency room found to have UTI, recurrent and low sodium 130.  Patient is alert oriented times three. She tells me she wants to go home. Will admit her for overnight observation.  PAST MEDICAL HISTORY:   Past Medical History:  Diagnosis Date  . Asthma   . Breast cancer ([place]) 2009   left  . Cancer ([place])   . CHF (congestive heart failure) ([place])   . CHF (congestive heart failure) ([place])   . Chronic UTI   . COPD (chronic obstructive pulmonary disease) ([place])   . Dizziness   . Fibromyalgia   . Hypertension   . Neuropathy   . Personal history of tobacco use, presenting hazards to health 05/17/2015  . Polyp, larynx   . RA (rheumatoid arthritis) ([place])   . Sinus infection    recent  . Stumbling gait    to the left  . Supplemental oxygen dependent    2.5l    PAST SURGICAL HISTOIRY:   Past Surgical History:  Procedure Laterality Date  . ABDOMINAL HYSTERECTOMY    . BREAST LUMPECTOMY Left 2009   chemo and radiation  . CYST EXCISION Left 02/27/2015   Procedure: CYST REMOVAL;  Surgeon: Hessie Knows, MD;  Location: [facility];  Service: Orthopedics;  Laterality: Left;  . EYE MUSCLE SURGERY Right    13 surgeries  .  FLEXIBLE BRONCHOSCOPY N/A 07/01/2016   Procedure: FLEXIBLE BRONCHOSCOPY;  Surgeon: Wilhelmina Mcardle, MD;  Location: [facility] OR[facility];  Service: Pulmonary;  Laterality: N/A;  . FLEXIBLE BRONCHOSCOPY N/A 02/09/2017   Procedure: FLEXIBLE BRONCHOSCOPY;  Surgeon: [name], MD;  Location: [facility] OR[facility];  Service: Pulmonary;  Laterality: N/A;  . KYPHOPLASTY N/A 05/22/2017   Procedure: Celine Ahr;  Surgeon: Hessie Knows, MD;  Location: [facility] OR[facility];  Service: Orthopedics;  Laterality: N/A;  . PORTA CATH INSERTION N/A 03/04/2017   Procedure: PORTA CATH INSERTION;  Surgeon: [name], MD;  Location: [facility];  Service: Cardiovascular;  Laterality: N/A;  . THUMB ARTHROSCOPY Left     SOCIAL HISTORY:   Social History   Tobacco Use  . Smoking status: Current Every Day Smoker    Packs/day: 1.00    Years: 40.00    Pack years: 40.00    Types: Cigarettes  . Smokeless tobacco: Never Used  Substance Use Topics  . Alcohol use: No    FAMILY HISTORY:   Family History  Problem Relation Age of Onset  . Diabetes Father   . Stroke Father   . Heart attack Father   . CAD Sister     DRUG ALLERGIES:   Allergies  Allergen Reactions  . Contrast Media [Iodinated Diagnostic Agents] Shortness Of Breath  Acute onset of shortness of breath following CT contrast administration 06/18/17. Sent to the ED. Dr. Kerman Passey documented incident as an allergic reaction. Needs premedications prior to future contrast media injections. Previous questionable contrast allergy, so the patient was given entire or partial premedications without complications. She was not given any premedications on 06/18/17, resulting in shortness of breath and an ED visit.  . Sulfa Antibiotics Other (See Comments)    Unknown  . Tetracycline Hives  . White Petrolatum Other (See Comments)    Blisters  . Amoxicillin-Pot Clavulanate Rash and Other (See Comments)    Blisters in mouth Has patient had a PCN reaction causing  immediate rash, facial/tongue/throat swelling, SOB or lightheadedness with hypotension: No Has patient had a PCN reaction causing severe rash involving mucus membranes or skin necrosis: No Has patient had a PCN reaction that required hospitalization: No Has patient had a PCN reaction occurring within the last 10 years: No If all of the above answers are "NO", then may proceed with Cephalosporin use.   . Tape Rash    REVIEW OF SYSTEMS:  Review of Systems  Constitutional: Negative for chills, fever and weight loss.  HENT: Negative for ear discharge, ear pain and nosebleeds.   Eyes: Negative for blurred vision, pain and discharge.  Respiratory: Negative for sputum production, shortness of breath, wheezing and stridor.   Cardiovascular: Positive for chest pain. Negative for palpitations, orthopnea and PND.  Gastrointestinal: Negative for abdominal pain, diarrhea, nausea and vomiting.  Genitourinary: Negative for frequency and urgency.  Musculoskeletal: Positive for falls and joint pain. Negative for back pain.  Neurological: Positive for weakness. Negative for sensory change, speech change and focal weakness.  Psychiatric/Behavioral: Negative for depression and hallucinations. The patient is not nervous/anxious.      MEDICATIONS AT HOME:   Prior to Admission medications   Medication Sig Start Date End Date Taking? Authorizing Provider  albuterol (PROVENTIL HFA;VENTOLIN HFA) 108 (90 Base) MCG/ACT inhaler Inhale 1-2 puffs into the lungs every 6 (six) hours as needed for wheezing or shortness of breath. 02/16/17  Yes [name], MD  Calcium-Vitamin D 600-200 MG-UNIT tablet Take 1 tablet by mouth daily.    Yes [provider]  carvedilol (COREG) 12.5 MG tablet Take 1 tablet (12.5 mg total) by mouth 2 (two) times daily with a meal. 11/29/15  Yes Hugelmeyer, Alexis, DO  cholecalciferol (VITAMIN D) 1000 units tablet Take 1,000 Units by mouth daily.   Yes [provider]   clonazePAM (KLONOPIN) 1 MG tablet Take 1 mg by mouth 2 (two) times daily as needed for anxiety.    Yes [provider]  fentaNYL (DURAGESIC - DOSED MCG/HR) 50 MCG/HR Place 1 patch (50 mcg total) onto the skin every 3 (three) days. 06/12/17  Yes Karen Kitchens, NP  gabapentin (NEURONTIN) 600 MG tablet Take 600 mg by mouth 3 (three) times daily.  05/25/12  Yes [provider]  HYDROcodone-acetaminophen (NORCO/VICODIN) 5-325 MG tablet Take 1 tablet by mouth 2 (two) times daily as needed for pain. 06/26/17  Yes [provider]  imipramine (TOFRANIL) 25 MG tablet Take 25 mg by mouth at bedtime.  09/21/15  Yes [provider]  lidocaine-prilocaine (EMLA) cream Apply 1 application topically as needed. 03/19/17  Yes Karen Kitchens, NP  LORazepam (ATIVAN) 0.5 MG tablet Take 1 tablet (0.5 mg total) by mouth every 6 (six) hours as needed (Nausea or vomiting). 03/19/17  Yes Lequita Asal, MD  mirabegron ER (MYRBETRIQ) 25 MG TB24 tablet  Take 1 tablet (25 mg total) by mouth daily. 06/11/17  Yes McGowan, Larene Beach A, PA-C  Multiple Vitamin (MULTIVITAMIN) capsule Take 1 capsule by mouth daily.   Yes [provider]  nitroGLYCERIN (NITROSTAT) 0.4 MG SL tablet Place 0.4 mg under the tongue every 5 (five) minutes as needed for chest pain.    Yes [provider]  ondansetron (ZOFRAN) 8 MG tablet Take 1 tablet (8 mg total) by mouth 2 (two) times daily as needed. Start on the third day after chemotherapy. 03/19/17  Yes [name], Drue Second, MD  oxyCODONE (OXY IR/ROXICODONE) 5 MG immediate release tablet Take 1 tablet (5 mg total) by mouth every 6 (six) hours as needed for severe pain. 06/12/17  Yes Karen Kitchens, NP  Tiotropium Bromide Monohydrate (SPIRIVA RESPIMAT) 2.5 MCG/ACT AERS Inhale 2.5 mcg into the lungs 2 (two) times daily. 06/26/16  Yes Flora Lipps, MD  traMADol (ULTRAM) 50 MG tablet Take 2 tablets (100 mg total) by mouth daily. 06/25/17  Yes Jacquelin Hawking, NP   vitamin C (ASCORBIC ACID) 500 MG tablet Take 500 mg by mouth daily.   Yes [provider]  ZINC SULFATE PO Take 1 tablet by mouth daily.    Yes [provider]  zolpidem (AMBIEN) 5 MG tablet Take 5 mg by mouth at bedtime.   Yes [provider]      VITAL SIGNS:  Blood pressure 130/80, pulse (!) 107, temperature (!) 100.9 F (38.3 C), temperature source Oral, resp. rate 18, height _0  (1.575 m), weight 49.9 kg (110 lb), SpO2 95 %.  PHYSICAL EXAMINATION:  GENERAL:  77 y.o.-year-old patient lying in the bed with no acute distress.  EYES: Pupils equal, round, reactive to light and accommodation. No scleral icterus. Extraocular muscles intact.  HEENT: Head atraumatic, normocephalic. Oropharynx and nasopharynx clear. Acquired alopecia NECK:  Supple, no jugular venous distention. No thyroid enlargement, no tenderness.  LUNGS: Normal breath sounds bilaterally, no wheezing, rales,rhonchi or crepitation. No use of accessory muscles of respiration. Right upper chest port CARDIOVASCULAR: S1, S2 normal. No murmurs, rubs, or gallops.  ABDOMEN: Soft, nontender, nondistended. Bowel sounds present. No organomegaly or mass.  EXTREMITIES: No pedal edema, cyanosis, or clubbing.  NEUROLOGIC: Cranial nerves II through XII are intact. Muscle strength 5/5 in all extremities. Sensation intact. Gait not checked.  PSYCHIATRIC: The patient is alert and oriented x 3.  SKIN: No obvious rash, lesion, or ulcer.   LABORATORY PANEL:   CBC Recent Labs  Lab 06/30/17 0934  WBC 12.3*  HGB 11.1*  HCT 33.0*  PLT 129*   ------------------------------------------------------------------------------------------------------------------  Chemistries  Recent Labs  Lab 06/30/17 0934  NA 130*  K 4.9  CL 92*  CO2 29  GLUCOSE 102*  BUN 20  CREATININE 1.17*  CALCIUM 8.5*  AST 23  ALT 13*  ALKPHOS 95  BILITOT 1.0    ------------------------------------------------------------------------------------------------------------------  Cardiac Enzymes Recent Labs  Lab 06/30/17 0934  TROPONINI 0.09*   ------------------------------------------------------------------------------------------------------------------  RADIOLOGY:  Ct Head Wo Contrast  Result Date: 06/30/2017 CLINICAL DATA:  Altered level of consciousness. EXAM: CT HEAD WITHOUT CONTRAST TECHNIQUE: Contiguous axial images were obtained from the base of the skull through the vertex without intravenous contrast. COMPARISON:  12/11/2016 FINDINGS: Brain: Diffuse cerebral atrophy, most notable over the frontal lobes. No acute intracranial abnormality. Specifically, no hemorrhage, hydrocephalus, mass lesion, acute infarction, or significant intracranial injury. Vascular: No hyperdense vessel or unexpected calcification. Skull: No acute calvarial abnormality. Sinuses/Orbits: Postoperative changes in the right paranasal sinuses.  No acute findings. Other: None IMPRESSION: No acute intracranial abnormality. Electronically Signed   By: Rolm Baptise M.D.   On: 06/30/2017 09:49    EKG:    IMPRESSION AND PLAN:   [name] [name]  is a 77 y.o. female with a known history of metastatic squamous cell carcinoma of the left lower lobe undergoing chemo and radiation at the cancer center, chronic chest pain, COPD on chronic home oxygen comes to the emergency room with confusion and had a fall at home.  1. acute confusion - encephalopathy secondary to UTI and hyponatremia -it appears alert and awake oriented times two -no family in the ER -admit for overnight observation -IV fluids, IV Rocephin, follow sodium Physical -therapy to see patient  2. acute hyponatremia suspected due to dehydration -IV fluids. Follow sodium   3. reCurrent UTI -IV Rocephin follow urine culture -follows with Zara Council with [facility] neurology she has upcoming appointment next  week  4. Generalized weakness fall will get physical therapy involved  5. No family members present in the ER  6. DVT prophylaxis SCD given low platelets I will avoid antiplatelet agent  All the records are reviewed and case discussed with ED provider. Management plans discussed with the patient, family and they are in agreement.  CODE STATUS: full code  TOTAL TIME TAKING CARE OF THIS PATIENT: *50* minutes.    Fritzi Mandes M.D on 06/30/2017 at 11:53 AM  Between 7am to 6pm - Pager - [phone]  After 6pm go to [email or web address] - password [email or web address] [facility]  [facility]  Office  [phone]  CC: Primary care physician; Tracie Harrier, MD

## 2017-06-30 NOTE — ED Provider Notes (Signed)
[facility] Emergency Department Provider Note       Time seen: ----------------------------------------- 9:15 AM on 06/30/2017 -----------------------------------------   I have reviewed the triage vital signs and the nursing notes.  HISTORY   Chief Complaint Fall and Altered Mental Status    HPI [name] [name] is a 77 y.o. female with a history of breast cancer, CHF, COPD, hypertension, rheumatoid arthritis who presents to the ED for multiple falls.  Patient arrives by EMS from home.  She lives at home with her grandson who is the primary caregiver.  EMS reports a temperature of 100.2 in route.  She was found sitting in her recliner and had urinated on herself.  Initially she was very confused and does not remember falling and was altered upon EMSs arrival.  She states she fell off the commode to me.  She denies any other complaints.  She is on 3 L of nasal cannula oxygen all the time.  Past Medical History:  Diagnosis Date  . Asthma   . Breast cancer ([place]) 2009   left  . Cancer ([place])   . CHF (congestive heart failure) ([place])   . CHF (congestive heart failure) ([place])   . Chronic UTI   . COPD (chronic obstructive pulmonary disease) ([place])   . Dizziness   . Fibromyalgia   . Hypertension   . Neuropathy   . Personal history of tobacco use, presenting hazards to health 05/17/2015  . Polyp, larynx   . RA (rheumatoid arthritis) ([place])   . Sinus infection    recent  . Stumbling gait    to the left  . Supplemental oxygen dependent    2.5l    Patient Active Problem List   Diagnosis Date Noted  . UTI due to extended-spectrum beta lactamase (ESBL) producing Escherichia coli 05/21/2017  . Acute midline thoracic back pain 05/09/2017  . Compression fracture of T7 vertebra (H[facility]) 05/08/2017  . Rib pain on right side 05/08/2017  . Encounter for antineoplastic immunotherapy 05/08/2017  . B12 deficiency 04/29/2017  . Anemia 04/11/2017  . History of recurrent  UTIs 04/11/2017  . Diarrhea 03/28/2017  . Rib lesion 03/03/2017  . Cancer ([place]) 02/16/2017  . Goals of care, counseling/discussion 02/02/2017  . Collapse of left lung 02/02/2017  . Pleural effusion, left 02/02/2017  . Pneumonia of left lower lobe due to infectious organism ([place]) 11/04/2016  . Recurrent UTI 09/08/2016  . Lung mass 09/08/2016  . Encounter for antineoplastic chemotherapy 07/24/2016  . Primary cancer of left lower lobe of lung ([place]) 06/25/2016  . Mixed hyperlipidemia 06/19/2016  . Peripheral neuropathy 06/19/2016  . Aneurysm of thoracic aorta ([place]) 04/29/2016  . Pain in limb 04/29/2016  . Acute delirium 03/06/2016  . Acute renal insufficiency 03/06/2016  . Pressure injury of skin 03/05/2016  . UTI (urinary tract infection) 03/04/2016  . Acute renal failure (ARF) ([place]) 12/04/2015  . Malnutrition of moderate degree 11/24/2015  . Hyponatremia   . COPD (chronic obstructive pulmonary disease) ([place]) 11/15/2015  . Chronic systolic CHF (congestive heart failure) ([place]) 11/15/2015  . Descending thoracic aortic aneurysm ([place]) 09/11/2015  . Personal history of tobacco use, presenting hazards to health 05/17/2015  . COPD, moderate ([place]) 05/19/2014  . Abnormal cardiovascular stress test 03/10/2014  . CAD (coronary artery disease) 02/25/2014  . Aneurysm of abdominal vessel ([place]) 02/24/2014  . Shortness of breath 02/24/2014  . Vocal cord polyp 02/13/2014  . Narcotic drug use 01/17/2013  . Fibromyalgia 05/11/2012  . Low back pain 05/11/2012  .  Arthritis 11/03/2011  . Asthma 11/03/2011  . Breast cancer ([place]) 11/03/2011  . Osteoarthritis 06/17/2011    Past Surgical History:  Procedure Laterality Date  . ABDOMINAL HYSTERECTOMY    . BREAST LUMPECTOMY Left 2009   chemo and radiation  . CYST EXCISION Left 02/27/2015   Procedure: CYST REMOVAL;  Surgeon: Hessie Knows, MD;  Location: [facility];  Service: Orthopedics;  Laterality: Left;  . EYE MUSCLE SURGERY Right    13 surgeries   . FLEXIBLE BRONCHOSCOPY N/A 07/01/2016   Procedure: FLEXIBLE BRONCHOSCOPY;  Surgeon: Wilhelmina Mcardle, MD;  Location: [facility];  Service: Pulmonary;  Laterality: N/A;  . FLEXIBLE BRONCHOSCOPY N/A 02/09/2017   Procedure: FLEXIBLE BRONCHOSCOPY;  Surgeon: [name], MD;  Location: [facility];  Service: Pulmonary;  Laterality: N/A;  . KYPHOPLASTY N/A 05/22/2017   Procedure: Celine Ahr;  Surgeon: Hessie Knows, MD;  Location: [facility];  Service: Orthopedics;  Laterality: N/A;  . PORTA CATH INSERTION N/A 03/04/2017   Procedure: PORTA CATH INSERTION;  Surgeon: [name], MD;  Location: [facility];  Service: Cardiovascular;  Laterality: N/A;  . THUMB ARTHROSCOPY Left     Allergies Contrast media [iodinated diagnostic agents]; Sulfa antibiotics; Tetracycline; White petrolatum; Amoxicillin-pot clavulanate; and Tape  Social History Social History   Tobacco Use  . Smoking status: Current Every Day Smoker    Packs/day: 1.00    Years: 40.00    Pack years: 40.00    Types: Cigarettes  . Smokeless tobacco: Never Used  Substance Use Topics  . Alcohol use: No  . Drug use: No   Review of Systems Constitutional: Positive for fever Cardiovascular: Negative for chest pain. Respiratory: Positive for chronic shortness of breath Gastrointestinal: Negative for abdominal pain, vomiting and diarrhea. Musculoskeletal: Negative for back pain. Skin: Negative for rash. Neurological: Positive for weakness and confusion  All systems negative/normal/unremarkable except as stated in the HPI  ____________________________________________   PHYSICAL EXAM:  VITAL SIGNS: ED Triage Vitals [06/30/17 0913]  Enc Vitals Group     BP      Pulse      Resp      Temp      Temp src      SpO2 100 %     Weight      Height      Head Circumference      Peak Flow      Pain Score      Pain Loc      Pain Edu?      Excl. in [place]?    Constitutional: Alert but disoriented.  Well appearing  and in no distress. Eyes: Conjunctivae are normal. Normal extraocular movements. ENT   Head: Normocephalic and atraumatic.   Nose: No congestion/rhinnorhea.   Mouth/Throat: Mucous membranes are moist.   Neck: No stridor. Cardiovascular: Normal rate, regular rhythm. No murmurs, rubs, or gallops. Respiratory: Normal respiratory effort without tachypnea nor retractions.  Bilateral wheezing and rhonchi Gastrointestinal: Soft and nontender. Normal bowel sounds Musculoskeletal: Nontender with normal range of motion in extremities. No lower extremity tenderness nor edema. Neurologic:  Normal speech and language. No gross focal neurologic deficits are appreciated.  Skin:  Skin is warm, dry and intact. No rash noted. Psychiatric: Mood and affect are normal. Speech and behavior are normal.  ____________________________________________  EKG: Interpreted by me.  Baseline artifact, sinus rhythm the rate of 102 bpm, left axis deviation, normal QRS size  ____________________________________________  ED COURSE:  As part of my medical decision making, I  reviewed the following data within the [place] History obtained from family if available, nursing notes, old chart and ekg, as well as notes from prior ED visits. Patient presented for altered mental status and recent falls, we will assess with labs and imaging as indicated at this time.   Procedures ____________________________________________   LABS (pertinent positives/negatives)  Labs Reviewed  CBC WITH DIFFERENTIAL/PLATELET - Abnormal; Notable for the following components:      Result Value   WBC 12.3 (*)    RBC 3.52 (*)    Hemoglobin 11.1 (*)    HCT 33.0 (*)    RDW 16.7 (*)    Platelets 129 (*)    Neutro Abs 10.7 (*)    Lymphs Abs 0.3 (*)    Monocytes Absolute 1.2 (*)    All other components within normal limits  COMPREHENSIVE METABOLIC PANEL - Abnormal; Notable for the following components:   Sodium  130 (*)    Chloride 92 (*)    Glucose, Bld 102 (*)    Creatinine, Ser 1.17 (*)    Calcium 8.5 (*)    Albumin 3.2 (*)    ALT 13 (*)    GFR calc non Af Amer 45 (*)    GFR calc Af Amer 52 (*)    All other components within normal limits  TROPONIN I - Abnormal; Notable for the following components:   Troponin I 0.09 (*)    All other components within normal limits  URINALYSIS, COMPLETE (UACMP) WITH MICROSCOPIC - Abnormal; Notable for the following components:   Color, Urine YELLOW (*)    APPearance HAZY (*)    Hgb urine dipstick SMALL (*)    Protein, ur 30 (*)    Nitrite POSITIVE (*)    Leukocytes, UA LARGE (*)    Bacteria, UA MANY (*)    All other components within normal limits  BLOOD GAS, VENOUS - Abnormal; Notable for the following components:   pO2, Ven <31.0 (*)    Bicarbonate 33.9 (*)    Acid-Base Excess 6.8 (*)    All other components within normal limits  CULTURE, BLOOD (ROUTINE X 2)  CULTURE, BLOOD (ROUTINE X 2)  URINE CULTURE  LACTIC ACID, PLASMA  CBG MONITORING, ED    RADIOLOGY CT head IMPRESSION: No acute intracranial abnormality. ____________________________________________  DIFFERENTIAL DIAGNOSIS   Dehydration, electrolyte abnormality, metastasis, hypercarbic respiratory failure, pneumonia, occult infection  FINAL ASSESSMENT AND PLAN  Altered mental status, frequent falls, UTI   Plan: The patient had presented for altered mental status and frequent falls.. Patient's labs did reveal leukocytosis as well as an elevated troponin.  We have sent for cultures and given IV broad-spectrum antibiotics, she has a history of UTIs.  Patient's imaging was negative for any metastasis.  I will discuss with the hospitalist for admission.   Laurence Aly, MD   Note: This note was generated in part or whole with voice recognition software. Voice recognition is usually quite accurate but there are transcription errors that can and very often do occur. I  apologize for any typographical errors that were not detected and corrected.     Earleen Newport, MD 06/30/17 [phone]

## 2017-06-30 NOTE — Care Management Obs Status (Signed)
[facility] NOTIFICATION   Patient Details  Name: [name] [name] MRN: [id number] Date of Birth: [birth year]   Medicare Observation Status Notification Given:  Yes    Marshell Garfinkel, RN 06/30/2017, 3:34 PM

## 2017-06-30 NOTE — ED Notes (Signed)
Admitting MD at bedside.

## 2017-06-30 NOTE — Progress Notes (Signed)
ANTIBIOTIC CONSULT NOTE - INITIAL  Pharmacy Consult for Meropenem  Indication: bacteremia  Allergies  Allergen Reactions  . Contrast Media [Iodinated Diagnostic Agents] Shortness Of Breath    Acute onset of shortness of breath following CT contrast administration 06/18/17. Sent to the ED. Dr. Kerman Passey documented incident as an allergic reaction. Needs premedications prior to future contrast media injections. Previous questionable contrast allergy, so the patient was given entire or partial premedications without complications. She was not given any premedications on 06/18/17, resulting in shortness of breath and an ED visit.  . Sulfa Antibiotics Other (See Comments)    Unknown  . Tetracycline Hives  . White Petrolatum Other (See Comments)    Blisters  . Amoxicillin-Pot Clavulanate Rash and Other (See Comments)    Blisters in mouth Has patient had a PCN reaction causing immediate rash, facial/tongue/throat swelling, SOB or lightheadedness with hypotension: No Has patient had a PCN reaction causing severe rash involving mucus membranes or skin necrosis: No Has patient had a PCN reaction that required hospitalization: No Has patient had a PCN reaction occurring within the last 10 years: No If all of the above answers are "NO", then may proceed with Cephalosporin use.   . Tape Rash    Patient Measurements: Height: 5\' 1"  (154.9 cm) Weight: 110 lb (49.9 kg) IBW/kg (Calculated) : 47.8 Adjusted Body Weight:   Vital Signs: Temp: 100 F (37.8 C) (06/18 1944) Temp Source: Oral (06/18 1944) BP: 124/75 (06/18 1944) Pulse Rate: 91 (06/18 1944) Intake/Output from previous day: No intake/output data recorded. Intake/Output from this shift: No intake/output data recorded.  Labs: Recent Labs    06/30/17 0934  WBC 12.3*  HGB 11.1*  PLT 129*  CREATININE 1.17*   Estimated Creatinine Clearance: 31.8 mL/min (A) (by C-G formula based on SCr of 1.17 mg/dL (H)). No results for input(s):  VANCOTROUGH, VANCOPEAK, VANCORANDOM, GENTTROUGH, GENTPEAK, GENTRANDOM, TOBRATROUGH, TOBRAPEAK, TOBRARND, AMIKACINPEAK, AMIKACINTROU, AMIKACIN in the last 72 hours.   Microbiology: Recent Results (from the past 720 hour(s))  Microscopic Examination     Status: Abnormal   Collection Time: 06/11/17  3:26 PM  Result Value Ref Range Status   WBC, UA >30 (H) 0 - 5 /hpf Final   RBC, UA 0-2 0 - 2 /hpf Final   Epithelial Cells (non renal) 0-10 0 - 10 /hpf Final   Renal Epithel, UA 0-10 (A) None seen /hpf Final   Casts Present (A) None seen /lpf Final   Cast Type Hyaline casts N/A Final   Mucus, UA Present (A) Not Estab. Final   Bacteria, UA Many (A) None seen/Few Final  Urine Culture     Status: Abnormal   Collection Time: 06/11/17  3:29 PM  Result Value Ref Range Status   Urine Culture, Routine Final report (A)  Final   Organism ID, Bacteria Escherichia coli (A)  Final    Comment: Greater than 100,000 colony forming units per mL Susceptibility profile is consistent with a probable ESBL.    Antimicrobial Susceptibility Comment  Final    Comment:       ** S = Susceptible; I = Intermediate; R = Resistant **                    P = Positive; N = Negative             MICS are expressed in micrograms per mL    Antibiotic  RSLT#1    RSLT#2    RSLT#3    RSLT#4 Amoxicillin/Clavulanic Acid    S Ampicillin                     R Cefazolin                      R Cefepime                       R Ceftriaxone                    R Cefuroxime                     R Ciprofloxacin                  R Ertapenem                      S Gentamicin                     S Imipenem                       S Levofloxacin                   R Meropenem                      S Nitrofurantoin                 I Piperacillin/Tazobactam        S Tetracycline                   R Tobramycin                     S Trimethoprim/Sulfa             R   Blood culture (routine x 2)     Status: None (Preliminary  result)   Collection Time: 06/30/17  9:34 AM  Result Value Ref Range Status   Specimen Description BLOOD LEFT ANTECUBITAL  Final   Special Requests   Final    BOTTLES DRAWN AEROBIC AND ANAEROBIC Blood Culture results may not be optimal due to an excessive volume of blood received in culture bottles   Culture  Setup Time   Final    GRAM NEGATIVE RODS Performed at [facility], [address] [place], [place] [address]    Culture GRAM NEGATIVE RODS  Final   Report Status PENDING  Incomplete  Blood culture (routine x 2)     Status: None (Preliminary result)   Collection Time: 06/30/17  9:34 AM  Result Value Ref Range Status   Specimen Description BLOOD RIGHT ANTECUBITAL  Final   Special Requests   Final    BOTTLES DRAWN AEROBIC AND ANAEROBIC Blood Culture adequate volume   Culture  Setup Time   Final    Organism ID to follow GRAM NEGATIVE RODS ANAEROBIC BOTTLE ONLY CRITICAL RESULT CALLED TO, READ BACK BY AND VERIFIED WITH: Mishel Sans @2039  06/30/17 AKT Performed at [facility], [address] [place], [place] [address]    Culture GRAM NEGATIVE RODS  Final   Report Status PENDING  Incomplete  Blood Culture ID Panel (Reflexed)     Status: Abnormal   Collection Time: 06/30/17  9:34  AM  Result Value Ref Range Status   Enterococcus species NOT DETECTED NOT DETECTED Final   Listeria monocytogenes NOT DETECTED NOT DETECTED Final   Staphylococcus species NOT DETECTED NOT DETECTED Final   Staphylococcus aureus NOT DETECTED NOT DETECTED Final   Streptococcus species NOT DETECTED NOT DETECTED Final   Streptococcus agalactiae NOT DETECTED NOT DETECTED Final   Streptococcus pneumoniae NOT DETECTED NOT DETECTED Final   Streptococcus pyogenes NOT DETECTED NOT DETECTED Final   Acinetobacter baumannii NOT DETECTED NOT DETECTED Final   Enterobacteriaceae species DETECTED (A) NOT DETECTED Final    Comment: Enterobacteriaceae represent a large family of gram-negative  bacteria, not a single organism. CRITICAL RESULT CALLED TO, READ BACK BY AND VERIFIED WITH: Alexsis Kathman @2039  06/30/17 AKT    Enterobacter cloacae complex NOT DETECTED NOT DETECTED Final   Escherichia coli DETECTED (A) NOT DETECTED Final    Comment: CRITICAL RESULT CALLED TO, READ BACK BY AND VERIFIED WITH: Nesanel Aguila @2039  06/30/17 AKT    Klebsiella oxytoca NOT DETECTED NOT DETECTED Final   Klebsiella pneumoniae NOT DETECTED NOT DETECTED Final   Proteus species NOT DETECTED NOT DETECTED Final   Serratia marcescens NOT DETECTED NOT DETECTED Final   Carbapenem resistance NOT DETECTED NOT DETECTED Final   Haemophilus influenzae NOT DETECTED NOT DETECTED Final   Neisseria meningitidis NOT DETECTED NOT DETECTED Final   Pseudomonas aeruginosa NOT DETECTED NOT DETECTED Final   Candida albicans NOT DETECTED NOT DETECTED Final   Candida glabrata NOT DETECTED NOT DETECTED Final   Candida krusei NOT DETECTED NOT DETECTED Final   Candida parapsilosis NOT DETECTED NOT DETECTED Final   Candida tropicalis NOT DETECTED NOT DETECTED Final    Comment: Performed at [facility], [address], [place], [place] [address]    Medical History: Past Medical History:  Diagnosis Date  . Asthma   . Breast cancer ([place]) 2009   left  . Cancer ([place])   . CHF (congestive heart failure) ([place])   . CHF (congestive heart failure) ([place])   . Chronic UTI   . COPD (chronic obstructive pulmonary disease) ([place])   . Dizziness   . Fibromyalgia   . Hypertension   . Neuropathy   . Personal history of tobacco use, presenting hazards to health 05/17/2015  . Polyp, larynx   . RA (rheumatoid arthritis) ([place])   . Sinus infection    recent  . Stumbling gait    to the left  . Supplemental oxygen dependent    2.5l    Medications:  Medications Prior to Admission  Medication Sig Dispense Refill Last Dose  . albuterol (PROVENTIL HFA;VENTOLIN HFA) 108 (90 Base) MCG/ACT inhaler Inhale 1-2 puffs into the lungs  every 6 (six) hours as needed for wheezing or shortness of breath. 1 Inhaler 2 PRN at PRN  . Calcium-Vitamin D 600-200 MG-UNIT tablet Take 1 tablet by mouth daily.    Past Week at Unknown time  . carvedilol (COREG) 12.5 MG tablet Take 1 tablet (12.5 mg total) by mouth 2 (two) times daily with a meal. 60 tablet 0 06/29/2017 at 2000  . cholecalciferol (VITAMIN D) 1000 units tablet Take 1,000 Units by mouth daily.   Past Week at Unknown time  . clonazePAM (KLONOPIN) 1 MG tablet Take 1 mg by mouth 2 (two) times daily as needed for anxiety.    PRN at PRN  . fentaNYL (DURAGESIC - DOSED MCG/HR) 50 MCG/HR Place 1 patch (50 mcg total) onto the skin every 3 (three) days. 5 patch  0 06/29/2017 at 0800  . gabapentin (NEURONTIN) 600 MG tablet Take 600 mg by mouth 3 (three) times daily.    Past Week at Unknown time  . HYDROcodone-acetaminophen (NORCO/VICODIN) 5-325 MG tablet Take 1 tablet by mouth 2 (two) times daily as needed for pain.   PRN at PRN  . imipramine (TOFRANIL) 25 MG tablet Take 25 mg by mouth at bedtime.    06/29/2017 at 2000  . lidocaine-prilocaine (EMLA) cream Apply 1 application topically as needed. 30 g 3 PRN at PRN  . LORazepam (ATIVAN) 0.5 MG tablet Take 1 tablet (0.5 mg total) by mouth every 6 (six) hours as needed (Nausea or vomiting). 30 tablet 0 PRN at PRN  . mirabegron ER (MYRBETRIQ) 25 MG TB24 tablet Take 1 tablet (25 mg total) by mouth daily. 30 tablet 0 06/29/2017 at 0800  . Multiple Vitamin (MULTIVITAMIN) capsule Take 1 capsule by mouth daily.   Past Week at Unknown time  . nitroGLYCERIN (NITROSTAT) 0.4 MG SL tablet Place 0.4 mg under the tongue every 5 (five) minutes as needed for chest pain.    PRN at PRN  . ondansetron (ZOFRAN) 8 MG tablet Take 1 tablet (8 mg total) by mouth 2 (two) times daily as needed. Start on the third day after chemotherapy. 30 tablet 1 PRN at PRN  . oxyCODONE (OXY IR/ROXICODONE) 5 MG immediate release tablet Take 1 tablet (5 mg total) by mouth every 6 (six) hours  as needed for severe pain. 30 tablet 0 06/27/2017 at UNKNOWN  . Tiotropium Bromide Monohydrate (SPIRIVA RESPIMAT) 2.5 MCG/ACT AERS Inhale 2.5 mcg into the lungs 2 (two) times daily. 1 Inhaler 0 06/29/2017 at 2000  . traMADol (ULTRAM) 50 MG tablet Take 2 tablets (100 mg total) by mouth daily. 30 tablet 0 06/27/2017 at UNKNOWN  . vitamin C (ASCORBIC ACID) 500 MG tablet Take 500 mg by mouth daily.   Past Week at Unknown time  . ZINC SULFATE PO Take 1 tablet by mouth daily.    Past Week at Unknown time  . zolpidem (AMBIEN) 5 MG tablet Take 5 mg by mouth at bedtime.   06/29/2017 at 2000   Assessment: E Coli (KPC neg) growing in 2 of 4 anaerobic bottles.  CrCl = 31.8 ml/min   Goal of Therapy:  resolution of infection  Plan:  Expected duration 7 days with resolution of temperature and/or normalization of WBC   Will order Meropenem 1 gm IV Q12H to start 6/18 @ 21:00.   Bradly Sangiovanni D 06/30/2017,8:52 PM

## 2017-06-30 NOTE — Progress Notes (Signed)
PHARMACY - PHYSICIAN COMMUNICATION CRITICAL VALUE ALERT - BLOOD CULTURE IDENTIFICATION (BCID)  Results for orders placed or performed during the hospital encounter of 06/30/17  Blood Culture ID Panel (Reflexed) (Collected: 06/30/2017  9:34 AM)  Result Value Ref Range   Enterococcus species NOT DETECTED NOT DETECTED   Listeria monocytogenes NOT DETECTED NOT DETECTED   Staphylococcus species NOT DETECTED NOT DETECTED   Staphylococcus aureus NOT DETECTED NOT DETECTED   Streptococcus species NOT DETECTED NOT DETECTED   Streptococcus agalactiae NOT DETECTED NOT DETECTED   Streptococcus pneumoniae NOT DETECTED NOT DETECTED   Streptococcus pyogenes NOT DETECTED NOT DETECTED   Acinetobacter baumannii NOT DETECTED NOT DETECTED   Enterobacteriaceae species DETECTED (A) NOT DETECTED   Enterobacter cloacae complex NOT DETECTED NOT DETECTED   Escherichia coli DETECTED (A) NOT DETECTED   Klebsiella oxytoca NOT DETECTED NOT DETECTED   Klebsiella pneumoniae NOT DETECTED NOT DETECTED   Proteus species NOT DETECTED NOT DETECTED   Serratia marcescens NOT DETECTED NOT DETECTED   Carbapenem resistance NOT DETECTED NOT DETECTED   Haemophilus influenzae NOT DETECTED NOT DETECTED   Neisseria meningitidis NOT DETECTED NOT DETECTED   Pseudomonas aeruginosa NOT DETECTED NOT DETECTED   Candida albicans NOT DETECTED NOT DETECTED   Candida glabrata NOT DETECTED NOT DETECTED   Candida krusei NOT DETECTED NOT DETECTED   Candida parapsilosis NOT DETECTED NOT DETECTED   Candida tropicalis NOT DETECTED NOT DETECTED    Name of physician (or Provider) Contacted: [facility]   Changes to prescribed antibiotics required: Yes, will d/c ceftriaxone and start meropenem 1 gm IV Q12H   Kwali Wrinkle D 06/30/2017  8:51 PM

## 2017-07-01 ENCOUNTER — Ambulatory Visit: Payer: Self-pay | Admitting: Urology

## 2017-07-01 DIAGNOSIS — A499 Bacterial infection, unspecified: Secondary | ICD-10-CM | POA: Diagnosis not present

## 2017-07-01 DIAGNOSIS — C3490 Malignant neoplasm of unspecified part of unspecified bronchus or lung: Secondary | ICD-10-CM | POA: Diagnosis not present

## 2017-07-01 DIAGNOSIS — N390 Urinary tract infection, site not specified: Secondary | ICD-10-CM | POA: Diagnosis not present

## 2017-07-01 DIAGNOSIS — E871 Hypo-osmolality and hyponatremia: Secondary | ICD-10-CM | POA: Diagnosis not present

## 2017-07-01 DIAGNOSIS — R7881 Bacteremia: Secondary | ICD-10-CM | POA: Diagnosis not present

## 2017-07-01 LAB — BASIC METABOLIC PANEL
ANION GAP: 9 (ref 5–15)
BUN: 20 mg/dL (ref 6–20)
CALCIUM: 8.2 mg/dL — AB (ref 8.9–10.3)
CHLORIDE: 93 mmol/L — AB (ref 101–111)
CO2: 28 mmol/L (ref 22–32)
Creatinine, Ser: 1.2 mg/dL — ABNORMAL HIGH (ref 0.44–1.00)
GFR calc non Af Amer: 43 mL/min — ABNORMAL LOW (ref >60)
GFR, EST AFRICAN AMERICAN: 50 mL/min — AB (ref >60)
Glucose, Bld: 88 mg/dL (ref 65–99)
Potassium: 3.6 mmol/L (ref 3.5–5.1)
Sodium: 130 mmol/L — ABNORMAL LOW (ref 135–145)

## 2017-07-01 MED ORDER — ENOXAPARIN SODIUM 40 MG/0.4ML ~~LOC~~ SOLN
40.0000 mg | SUBCUTANEOUS | Status: DC
  Filled 2017-07-01: qty 0.4

## 2017-07-01 NOTE — Progress Notes (Signed)
Initial Nutrition Assessment  DOCUMENTATION CODES:   Non-severe (moderate) malnutrition in context of chronic illness  INTERVENTION:   - Magic cup TID with meals, each supplement provides 290 kcal and 9 grams of protein  - Continue MVI with minerals daily   NUTRITION DIAGNOSIS:   Moderate Malnutrition related to chronic illness, cancer and cancer related treatments(metastatic squamous cell carcinoma of left lower lobe undergoing chemotherapy and radiation, COPD, CHF) as evidenced by mild fat depletion, moderate fat depletion, mild muscle depletion, moderate muscle depletion.  GOAL:   Patient will meet greater than or equal to 90% of their needs  MONITOR:   PO intake, Supplement acceptance, Weight trends, I & O's, Labs  REASON FOR ASSESSMENT:   Malnutrition Screening Tool    ASSESSMENT:   77 year old female who presented to the ED after a fall. PMH significant for breast cancer, CHF, COPD, hypertension, and rheumatoid arthritis. Pt with metastatic squamous cell carcinoma of the left lower lobe undergoing chemotherapy and radiation at the cancer center.  Spoke with pt at bedside who was unable to provide appropriate answers to RD's questions regarding diet history PTA and weight history. No family present at time of visit so unable to obtain a more detailed diet and weight history.  Pt does state that she will not drink Ensure but is willing to try Magic Cup. NT bringing pt chocolate milk at time of RD visit. Pt starting lunch of spaghetti at time of visit.  Pt unsure of her UBW and states "I don't know" when asked if she has lost weight. Per weight history in chart, pt has experienced a 6.4% weight loss in 3 months which is not significant for timeframe. Unable to obtain bed weight at time of visit as pt was sitting on edge of bed eating lunch.  Meal Completion: 25%  Medications reviewed and include: [place] with D, 1000 units vitamin D daily, MVI with minerals daily, Senokot  daily, 500 mg vitamin C daily, 220 mg zinc sulfate daily  Labs reviewed: sodium 130 (L), chloride 93 (L), creatinine 1.20 (H), hemoglobin 11.1 (L), HCT 33.0 (L)  NUTRITION - FOCUSED PHYSICAL EXAM:    Most Recent Value  Orbital Region  Mild depletion  Upper Arm Region  Moderate depletion  Thoracic and Lumbar Region  Mild depletion  Buccal Region  Mild depletion  Temple Region  Mild depletion  Clavicle Bone Region  Mild depletion  Clavicle and Acromion Bone Region  Moderate depletion  Scapular Bone Region  Mild depletion  Dorsal Hand  Mild depletion  Patellar Region  Moderate depletion  Anterior Thigh Region  Moderate depletion  Posterior Calf Region  Moderate depletion  Edema (RD Assessment)  None  Hair  Other (Comment) [pt with no hair]  Eyes  Reviewed  Mouth  Reviewed  Skin  Reviewed  Nails  Reviewed       Diet Order:   Diet Order           DIET SOFT Room service appropriate? Yes; Fluid consistency: Thin  Diet effective now          EDUCATION NEEDS:   Not appropriate for education at this time  Skin:  Skin Assessment: Reviewed RN Assessment  Last BM:  06/30/17 small type 3  Height:   Ht Readings from Last 1 Encounters:  06/30/17 5\' 1"  (1.549 m)    Weight:   Wt Readings from Last 1 Encounters:  06/30/17 110 lb (49.9 kg)    Ideal Body Weight:  47.7 kg  BMI:  Body mass index is 20.78 kg/m.  Estimated Nutritional Needs:   Kcal:  1500-1700 kcal/day  Protein:  65-80 grams/day  Fluid:  1.5-1.7 L/day    Gaynell Face, MS, RD, LDN Pager: [phone] Weekend/After Hours: [phone]

## 2017-07-01 NOTE — Consult Note (Signed)
[facility] Infectious Disease     Reason for Consult:ESBL E coli    Referring Physician: Jeronimo Greaves Date of Admission:  06/30/2017   Active Problems:   Encephalopathy acute   HPI: [name] [name] is a 77 y.o. female with hx of metastatic squamous cell carcinoma of the left lower lobe with carboplatin, Abraxane, and pembrolizumab admitted with admitted with confusion and a fall.  On admission temperature was 101 white count 12.3.  Urinalysis reveals 21-50 white cells positive leukocyte esterase.  Blood cultures x2 are positive for E. coli. She has a hx of  E. coli urinary tract infection in the past and was treated with 3 days of IV carb ertapenem.  She follows with urology and had a renal ultrasound June 11 with no hydronephrosis or acute findings.  Was to have cystoscopy.    Past Medical History:  Diagnosis Date  . Asthma   . Breast cancer ([place]) 2009   left  . Cancer ([name])   . CHF (congestive heart failure) ([place])   . CHF (congestive heart failure) ([place])   . Chronic UTI   . COPD (chronic obstructive pulmonary disease) ([place])   . Dizziness   . Fibromyalgia   . Hypertension   . Neuropathy   . Personal history of tobacco use, presenting hazards to health 05/17/2015  . Polyp, larynx   . RA (rheumatoid arthritis) ([place])   . Sinus infection    recent  . Stumbling gait    to the left  . Supplemental oxygen dependent    2.5l   Past Surgical History:  Procedure Laterality Date  . ABDOMINAL HYSTERECTOMY    . BREAST LUMPECTOMY Left 2009   chemo and radiation  . CYST EXCISION Left 02/27/2015   Procedure: CYST REMOVAL;  Surgeon: Hessie Knows, MD;  Location: [facility] ORS;  Service: Orthopedics;  Laterality: Left;  . EYE MUSCLE SURGERY Right    13 surgeries  . FLEXIBLE BRONCHOSCOPY N/A 07/01/2016   Procedure: FLEXIBLE BRONCHOSCOPY;  Surgeon: Wilhelmina Mcardle, MD;  Location: [facility] ORS;  Service: Pulmonary;  Laterality: N/A;  . FLEXIBLE BRONCHOSCOPY N/A 02/09/2017   Procedure: FLEXIBLE  BRONCHOSCOPY;  Surgeon: [name], MD;  Location: [facility] ORS;  Service: Pulmonary;  Laterality: N/A;  . KYPHOPLASTY N/A 05/22/2017   Procedure: Celine Ahr;  Surgeon: Hessie Knows, MD;  Location: [facility] ORS;  Service: Orthopedics;  Laterality: N/A;  . PORTA CATH INSERTION N/A 03/04/2017   Procedure: PORTA CATH INSERTION;  Surgeon: [name], MD;  Location: [facility];  Service: Cardiovascular;  Laterality: N/A;  . THUMB ARTHROSCOPY Left    Social History   Tobacco Use  . Smoking status: Current Every Day Smoker    Packs/day: 1.00    Years: 40.00    Pack years: 40.00    Types: Cigarettes  . Smokeless tobacco: Never Used  Substance Use Topics  . Alcohol use: No  . Drug use: No   Family History  Problem Relation Age of Onset  . Diabetes Father   . Stroke Father   . Heart attack Father   . CAD Sister     Allergies:  Allergies  Allergen Reactions  . Contrast Media [Iodinated Diagnostic Agents] Shortness Of Breath    Acute onset of shortness of breath following CT contrast administration 06/18/17. Sent to the ED. Dr. Kerman Passey documented incident as an allergic reaction. Needs premedications prior to future contrast media injections. Previous questionable contrast allergy, so the patient was given entire or partial premedications  without complications. She was not given any premedications on 06/18/17, resulting in shortness of breath and an ED visit.  . Sulfa Antibiotics Other (See Comments)    Unknown  . Tetracycline Hives  . White Petrolatum Other (See Comments)    Blisters  . Amoxicillin-Pot Clavulanate Rash and Other (See Comments)    Blisters in mouth Has patient had a PCN reaction causing immediate rash, facial/tongue/throat swelling, SOB or lightheadedness with hypotension: No Has patient had a PCN reaction causing severe rash involving mucus membranes or skin necrosis: No Has patient had a PCN reaction that required hospitalization: No Has patient  had a PCN reaction occurring within the last 10 years: No If all of the above answers are "NO", then may proceed with Cephalosporin use.   . Tape Rash    Current antibiotics: Antibiotics Given (last 72 hours)    Date/Time Action Medication Dose Rate   06/30/17 1108 New Bag/Given   piperacillin-tazobactam (ZOSYN) IVPB 3.375 g 3.375 g 100 mL/hr   06/30/17 1141 New Bag/Given   vancomycin (VANCOCIN) IVPB 1000 mg/200 mL premix 1,000 mg 200 mL/hr   06/30/17 1318 New Bag/Given   cefTRIAXone (ROCEPHIN) 1 g in sodium chloride 0.9 % 100 mL IVPB 1 g 200 mL/hr   06/30/17 2106 New Bag/Given   meropenem (MERREM) 1 g in sodium chloride 0.9 % 100 mL IVPB 1 g 200 mL/hr   07/01/17 0934 New Bag/Given   meropenem (MERREM) 1 g in sodium chloride 0.9 % 100 mL IVPB 1 g 200 mL/hr      MEDICATIONS: . calcium-vitamin D  1 tablet Oral Daily  . carvedilol  12.5 mg Oral BID WC  . cholecalciferol  1,000 Units Oral Daily  . enoxaparin (LOVENOX) injection  40 mg Subcutaneous Q24H  . [START ON 07/02/2017] fentaNYL  50 mcg Transdermal Q72H  . gabapentin  600 mg Oral TID  . imipramine  25 mg Oral QHS  . mirabegron ER  25 mg Oral Daily  . multivitamin with minerals  1 tablet Oral Daily  . senna  1 tablet Oral BID  . tiotropium  1 capsule Inhalation BID  . vitamin C  500 mg Oral Daily  . zinc sulfate  220 mg Oral Daily  . zolpidem  5 mg Oral QHS    Review of Systems - 11 systems reviewed and negative per HPI   OBJECTIVE: Temp:  [98.1 F (36.7 C)-101.2 F (38.4 C)] 98.1 F (36.7 C) (06/19 0505) Pulse Rate:  [75-107] 75 (06/19 0505) Resp:  [15-20] 16 (06/19 0505) BP: (116-141)/(53-96) 118/53 (06/19 0505) SpO2:  [91 %-100 %] 97 % (06/19 0505) Physical Exam  Constitutional:  Thin, chornically ill appearing but NAD HENT: [place]/AT, PERRLA, no scleral icterus Mouth/Throat: Oropharynx is clear and dry Cardiovascular: Normal rate, regular rhythm and normal heart sounds. Pulmonary/Chest: dec BS L basae   Portacath R chest wall wnl  Neck = supple, no nuchal rigidity Abdominal: Soft. Bowel sounds are normal.  exhibits no distension. There is no tenderness.  Lymphadenopathy: no cervical adenopathy. No axillary adenopathy Neurological: alert and oriented to person, place, and time.  Skin: Skin is warm and dry. No rash noted. No erythema.  Psychiatric: a normal mood and affect.  behavior is normal.    LABS: Results for orders placed or performed during the hospital encounter of 06/30/17 (from the past 48 hour(s))  Blood gas, venous     Status: Abnormal   Collection Time: 06/30/17  9:18 AM  Result Value Ref Range  pH, Ven 7.36 7.250 - 7.430   pCO2, Ven 60 44.0 - 60.0 mmHg   pO2, Ven <31.0 (LL) 32.0 - 45.0 mmHg    Comment: VENOUS   Bicarbonate 33.9 (H) 20.0 - 28.0 mmol/L   Acid-Base Excess 6.8 (H) 0.0 - 2.0 mmol/L   Patient temperature 37.0    Collection site VEIN    Sample type VENOUS     Comment: Performed at [facility], [address] [place], [place] [address]  CBC with Differential     Status: Abnormal   Collection Time: 06/30/17  9:34 AM  Result Value Ref Range   WBC 12.3 (H) 3.6 - 11.0 K/uL   RBC 3.52 (L) 3.80 - 5.20 MIL/uL   Hemoglobin 11.1 (L) 12.0 - 16.0 g/dL   HCT 33.0 (L) 35.0 - 47.0 %   MCV 93.9 80.0 - 100.0 fL   MCH 31.6 26.0 - 34.0 pg   MCHC 33.6 32.0 - 36.0 g/dL   RDW 16.7 (H) 11.5 - 14.5 %   Platelets 129 (L) 150 - 440 K/uL   Neutrophils Relative % 87 %   Neutro Abs 10.7 (H) 1.4 - 6.5 K/uL   Lymphocytes Relative 2 %   Lymphs Abs 0.3 (L) 1.0 - 3.6 K/uL   Monocytes Relative 10 %   Monocytes Absolute 1.2 (H) 0.2 - 0.9 K/uL   Eosinophils Relative 0 %   Eosinophils Absolute 0.0 0 - 0.7 K/uL   Basophils Relative 1 %   Basophils Absolute 0.1 0 - 0.1 K/uL    Comment: Performed at [facility], [address] [place], [place] [address]  Comprehensive metabolic panel     Status: Abnormal   Collection Time: 06/30/17  9:34 AM  Result Value Ref  Range   Sodium 130 (L) 135 - 145 mmol/L   Potassium 4.9 3.5 - 5.1 mmol/L   Chloride 92 (L) 101 - 111 mmol/L   CO2 29 22 - 32 mmol/L   Glucose, Bld 102 (H) 65 - 99 mg/dL   BUN 20 6 - 20 mg/dL   Creatinine, Ser 1.17 (H) 0.44 - 1.00 mg/dL   Calcium 8.5 (L) 8.9 - 10.3 mg/dL   Total Protein 6.9 6.5 - 8.1 g/dL   Albumin 3.2 (L) 3.5 - 5.0 g/dL   AST 23 15 - 41 U/L   ALT 13 (L) 14 - 54 U/L   Alkaline Phosphatase 95 38 - 126 U/L   Total Bilirubin 1.0 0.3 - 1.2 mg/dL   GFR calc non Af Amer 45 (L) >60 mL/min   GFR calc Af Amer 52 (L) >60 mL/min    Comment: (NOTE) The eGFR has been calculated using the CKD EPI equation. This calculation has not been validated in all clinical situations. eGFR's persistently <60 mL/min signify possible Chronic Kidney Disease.    Anion gap 9 5 - 15    Comment: Performed at [facility], [address] [place], [place] [address]  Troponin I     Status: Abnormal   Collection Time: 06/30/17  9:34 AM  Result Value Ref Range   Troponin I 0.09 (HH) <0.03 ng/mL    Comment: CRITICAL RESULT CALLED TO, READ BACK BY AND VERIFIED WITH MEGAN JONES _0  06/30/17 MGP Performed at [facility], [address] [place], [place] [address]   Urinalysis, Complete w Microscopic     Status: Abnormal   Collection Time: 06/30/17  9:34 AM  Result Value Ref Range   Color, Urine YELLOW (A) YELLOW  APPearance HAZY (A) CLEAR   Specific Gravity, Urine 1.009 1.005 - 1.030   pH 6.0 5.0 - 8.0   Glucose, UA NEGATIVE NEGATIVE mg/dL   Hgb urine dipstick SMALL (A) NEGATIVE   Bilirubin Urine NEGATIVE NEGATIVE   Ketones, ur NEGATIVE NEGATIVE mg/dL   Protein, ur 30 (A) NEGATIVE mg/dL   Nitrite POSITIVE (A) NEGATIVE   Leukocytes, UA LARGE (A) NEGATIVE   RBC / HPF 0-5 0 - 5 RBC/hpf   WBC, UA 21-50 0 - 5 WBC/hpf   Bacteria, UA MANY (A) NONE SEEN   Squamous Epithelial / LPF NONE SEEN 0 - 5   WBC Clumps PRESENT     Comment: Performed at [facility], [address] [place], [place] [address]  Blood culture (routine x 2)     Status: None (Preliminary result)   Collection Time: 06/30/17  9:34 AM  Result Value Ref Range   Specimen Description BLOOD LEFT ANTECUBITAL    Special Requests      BOTTLES DRAWN AEROBIC AND ANAEROBIC Blood Culture results may not be optimal due to an excessive volume of blood received in culture bottles   Culture  Setup Time      GRAM NEGATIVE RODS ANAEROBIC BOTTLE ONLY CRITICAL VALUE NOTED.  VALUE IS CONSISTENT WITH PREVIOUSLY REPORTED AND CALLED VALUE. Performed at [facility], [address] [place], [place] [address]    Culture GRAM NEGATIVE RODS    Report Status PENDING   Blood culture (routine x 2)     Status: None (Preliminary result)   Collection Time: 06/30/17  9:34 AM  Result Value Ref Range   Specimen Description BLOOD RIGHT ANTECUBITAL    Special Requests      BOTTLES DRAWN AEROBIC AND ANAEROBIC Blood Culture adequate volume   Culture  Setup Time      Organism ID to follow GRAM NEGATIVE RODS IN BOTH AEROBIC AND ANAEROBIC BOTTLES CRITICAL RESULT CALLED TO, READ BACK BY AND VERIFIED WITH: JASON ROBBINS _0  06/30/17 AKT Performed at [facility], [address] [place], [place] [address]    Culture GRAM NEGATIVE RODS    Report Status PENDING   Blood Culture ID Panel (Reflexed)     Status: Abnormal   Collection Time: 06/30/17  9:34 AM  Result Value Ref Range   Enterococcus species NOT DETECTED NOT DETECTED   Listeria monocytogenes NOT DETECTED NOT DETECTED   Staphylococcus species NOT DETECTED NOT DETECTED   Staphylococcus aureus NOT DETECTED NOT DETECTED   Streptococcus species NOT DETECTED NOT DETECTED   Streptococcus agalactiae NOT DETECTED NOT DETECTED   Streptococcus pneumoniae NOT DETECTED NOT DETECTED   Streptococcus pyogenes NOT DETECTED NOT DETECTED   Acinetobacter baumannii NOT DETECTED NOT DETECTED   Enterobacteriaceae species DETECTED (A) NOT DETECTED     Comment: Enterobacteriaceae represent a large family of gram-negative bacteria, not a single organism. CRITICAL RESULT CALLED TO, READ BACK BY AND VERIFIED WITH: JASON ROBBINS _1  06/30/17 AKT    Enterobacter cloacae complex NOT DETECTED NOT DETECTED   Escherichia coli DETECTED (A) NOT DETECTED    Comment: CRITICAL RESULT CALLED TO, READ BACK BY AND VERIFIED WITH: JASON ROBBINS _2  06/30/17 AKT    Klebsiella oxytoca NOT DETECTED NOT DETECTED   Klebsiella pneumoniae NOT DETECTED NOT DETECTED   Proteus species NOT DETECTED NOT DETECTED   Serratia marcescens NOT DETECTED NOT DETECTED   Carbapenem resistance NOT DETECTED NOT DETECTED   Haemophilus influenzae NOT DETECTED NOT DETECTED   Neisseria meningitidis NOT DETECTED  NOT DETECTED   Pseudomonas aeruginosa NOT DETECTED NOT DETECTED   Candida albicans NOT DETECTED NOT DETECTED   Candida glabrata NOT DETECTED NOT DETECTED   Candida krusei NOT DETECTED NOT DETECTED   Candida parapsilosis NOT DETECTED NOT DETECTED   Candida tropicalis NOT DETECTED NOT DETECTED    Comment: Performed at [facility], [address] [place], [place] [address]  Lactic acid, plasma     Status: None   Collection Time: 06/30/17  9:51 AM  Result Value Ref Range   Lactic Acid, Venous 1.4 0.5 - 1.9 mmol/L    Comment: Performed at [facility], [address] [place], [place] [address]  Basic metabolic panel     Status: Abnormal   Collection Time: 07/01/17  5:16 AM  Result Value Ref Range   Sodium 130 (L) 135 - 145 mmol/L   Potassium 3.6 3.5 - 5.1 mmol/L   Chloride 93 (L) 101 - 111 mmol/L   CO2 28 22 - 32 mmol/L   Glucose, Bld 88 65 - 99 mg/dL   BUN 20 6 - 20 mg/dL   Creatinine, Ser 1.20 (H) 0.44 - 1.00 mg/dL   Calcium 8.2 (L) 8.9 - 10.3 mg/dL   GFR calc non Af Amer 43 (L) >60 mL/min   GFR calc Af Amer 50 (L) >60 mL/min    Comment: (NOTE) The eGFR has been calculated using the CKD EPI equation. This calculation has not been validated  in all clinical situations. eGFR's persistently <60 mL/min signify possible Chronic Kidney Disease.    Anion gap 9 5 - 15    Comment: Performed at [facility], [address] [place], [place] [address]   No components found for: ESR, C REACTIVE PROTEIN MICRO: Recent Results (from the past 720 hour(s))  Microscopic Examination     Status: Abnormal   Collection Time: 06/11/17  3:26 PM  Result Value Ref Range Status   WBC, UA >30 (H) 0 - 5 /hpf Final   RBC, UA 0-2 0 - 2 /hpf Final   Epithelial Cells (non renal) 0-10 0 - 10 /hpf Final   Renal Epithel, UA 0-10 (A) None seen /hpf Final   Casts Present (A) None seen /lpf Final   Cast Type Hyaline casts N/A Final   Mucus, UA Present (A) Not Estab. Final   Bacteria, UA Many (A) None seen/Few Final  Urine Culture     Status: Abnormal   Collection Time: 06/11/17  3:29 PM  Result Value Ref Range Status   Urine Culture, Routine Final report (A)  Final   Organism ID, Bacteria Escherichia coli (A)  Final    Comment: Greater than 100,000 colony forming units per mL Susceptibility profile is consistent with a probable ESBL.    Antimicrobial Susceptibility Comment  Final    Comment:       ** S = Susceptible; I = Intermediate; R = Resistant **                    P = Positive; N = Negative             MICS are expressed in micrograms per mL    Antibiotic                 RSLT#1    RSLT#2    RSLT#3    RSLT#4 Amoxicillin/Clavulanic Acid    S Ampicillin  R Cefazolin                      R Cefepime                       R Ceftriaxone                    R Cefuroxime                     R Ciprofloxacin                  R Ertapenem                      S Gentamicin                     S Imipenem                       S Levofloxacin                   R Meropenem                      S Nitrofurantoin                 I Piperacillin/Tazobactam        S Tetracycline                   R Tobramycin                      S Trimethoprim/Sulfa             R   Blood culture (routine x 2)     Status: None (Preliminary result)   Collection Time: 06/30/17  9:34 AM  Result Value Ref Range Status   Specimen Description BLOOD LEFT ANTECUBITAL  Final   Special Requests   Final    BOTTLES DRAWN AEROBIC AND ANAEROBIC Blood Culture results may not be optimal due to an excessive volume of blood received in culture bottles   Culture  Setup Time   Final    GRAM NEGATIVE RODS ANAEROBIC BOTTLE ONLY CRITICAL VALUE NOTED.  VALUE IS CONSISTENT WITH PREVIOUSLY REPORTED AND CALLED VALUE. Performed at [facility], [address] [place], [place] [address]    Culture GRAM NEGATIVE RODS  Final   Report Status PENDING  Incomplete  Blood culture (routine x 2)     Status: None (Preliminary result)   Collection Time: 06/30/17  9:34 AM  Result Value Ref Range Status   Specimen Description BLOOD RIGHT ANTECUBITAL  Final   Special Requests   Final    BOTTLES DRAWN AEROBIC AND ANAEROBIC Blood Culture adequate volume   Culture  Setup Time   Final    Organism ID to follow GRAM NEGATIVE RODS IN BOTH AEROBIC AND ANAEROBIC BOTTLES CRITICAL RESULT CALLED TO, READ BACK BY AND VERIFIED WITH: JASON ROBBINS _0  06/30/17 AKT Performed at [facility], [address] [place], [place] [address]    Culture GRAM NEGATIVE RODS  Final   Report Status PENDING  Incomplete  Blood Culture ID Panel (Reflexed)     Status: Abnormal   Collection Time: 06/30/17  9:34 AM  Result Value Ref Range Status   Enterococcus species NOT DETECTED NOT DETECTED Final   Listeria monocytogenes NOT  DETECTED NOT DETECTED Final   Staphylococcus species NOT DETECTED NOT DETECTED Final   Staphylococcus aureus NOT DETECTED NOT DETECTED Final   Streptococcus species NOT DETECTED NOT DETECTED Final   Streptococcus agalactiae NOT DETECTED NOT DETECTED Final   Streptococcus pneumoniae NOT DETECTED NOT DETECTED Final   Streptococcus pyogenes NOT  DETECTED NOT DETECTED Final   Acinetobacter baumannii NOT DETECTED NOT DETECTED Final   Enterobacteriaceae species DETECTED (A) NOT DETECTED Final    Comment: Enterobacteriaceae represent a large family of gram-negative bacteria, not a single organism. CRITICAL RESULT CALLED TO, READ BACK BY AND VERIFIED WITH: JASON ROBBINS _0  06/30/17 AKT    Enterobacter cloacae complex NOT DETECTED NOT DETECTED Final   Escherichia coli DETECTED (A) NOT DETECTED Final    Comment: CRITICAL RESULT CALLED TO, READ BACK BY AND VERIFIED WITH: JASON ROBBINS _1  06/30/17 AKT    Klebsiella oxytoca NOT DETECTED NOT DETECTED Final   Klebsiella pneumoniae NOT DETECTED NOT DETECTED Final   Proteus species NOT DETECTED NOT DETECTED Final   Serratia marcescens NOT DETECTED NOT DETECTED Final   Carbapenem resistance NOT DETECTED NOT DETECTED Final   Haemophilus influenzae NOT DETECTED NOT DETECTED Final   Neisseria meningitidis NOT DETECTED NOT DETECTED Final   Pseudomonas aeruginosa NOT DETECTED NOT DETECTED Final   Candida albicans NOT DETECTED NOT DETECTED Final   Candida glabrata NOT DETECTED NOT DETECTED Final   Candida krusei NOT DETECTED NOT DETECTED Final   Candida parapsilosis NOT DETECTED NOT DETECTED Final   Candida tropicalis NOT DETECTED NOT DETECTED Final    Comment: Performed at [facility], [address] [place], [place] [address]    IMAGING: Ct Head Wo Contrast  Result Date: 06/30/2017 CLINICAL DATA:  Altered level of consciousness. EXAM: CT HEAD WITHOUT CONTRAST TECHNIQUE: Contiguous axial images were obtained from the base of the skull through the vertex without intravenous contrast. COMPARISON:  12/11/2016 FINDINGS: Brain: Diffuse cerebral atrophy, most notable over the frontal lobes. No acute intracranial abnormality. Specifically, no hemorrhage, hydrocephalus, mass lesion, acute infarction, or significant intracranial injury. Vascular: No hyperdense vessel or unexpected  calcification. Skull: No acute calvarial abnormality. Sinuses/Orbits: Postoperative changes in the right paranasal sinuses. No acute findings. Other: None IMPRESSION: No acute intracranial abnormality. Electronically Signed   By: Rolm Baptise M.D.   On: 06/30/2017 09:49   Ct Chest W Contrast  Result Date: 06/18/2017 CLINICAL DATA:  Left lower lobe lung cancer. EXAM: CT CHEST WITH CONTRAST TECHNIQUE: Multidetector CT imaging of the chest was performed during intravenous contrast administration. CONTRAST:  26m OMNIPAQUE IOHEXOL 300 MG/ML  SOLN COMPARISON:  04/30/2017 FINDINGS: Cardiovascular: The heart size is normal. No substantial pericardial effusion. Coronary artery calcification is evident. Advanced atherosclerosis of the thoracic aorta evident. Descending thoracic aorta measures up to 4.6 cm diameter on today's study. Right Port-A-Cath tip is positioned at the SVC/RA junction. Mediastinum/Nodes: No mediastinal or right hilar lymphadenopathy. Left lower lobe collapse/consolidation extends into the inferior left hilum, as before. The esophagus has normal imaging features. Surgical clips are noted in the left axilla. There is no axillary lymphadenopathy. Lungs/Pleura: Dependent mucus is identified in the trachea. Centrilobular emphysema noted bilaterally. No suspicious pulmonary nodule or mass in the right lung. Left lower lobe collapse is similar to prior. Small left pleural effusion is stable in the interval. Upper Abdomen: Small area of low attenuation in the anterior liver, adjacent to the falciform ligament, is in a characteristic location for focal fatty deposition. Cortical scarring is noted in the kidneys bilaterally,  incompletely visualized. Musculoskeletal: Evidence of prior thoracic vertebral augmentation. No worrisome lytic or sclerotic osseous abnormality. IMPRESSION: 1. Similar appearance of left lower lobe collapse with small left pleural effusion. 2. Advanced thoracic aortic atherosclerosis with  aneurysmal dilatation of the descending thoracic aorta measuring up to 4.5 cm today. Continued close attention on follow-up recommended. 3.  Emphysema. (ICD10-J43.9) 4.  Aortic Atherosclerois (ICD10-170.0) Electronically Signed   By: Misty Stanley M.D.   On: 06/18/2017 16:10   Nm Bone Scan Whole Body  Result Date: 06/04/2017 CLINICAL DATA:  Left breast carcinoma. Chronic left lower lobe collapse EXAM: NUCLEAR MEDICINE WHOLE BODY BONE SCAN TECHNIQUE: Whole body anterior and posterior images were obtained approximately 3 hours after intravenous injection of radiopharmaceutical. RADIOPHARMACEUTICALS:  22.368 mCi Technetium-13mMDP IV COMPARISON:  Bone scan February 27, 2017; chest CT April 30, 2017; intraoperative thoracic spine radiographs May 22, 2017 FINDINGS: There is focal increased radiotracer uptake in the midthoracic region at the sites of T7 and T8 compression fractures with kyphoplasty procedures. Died the equivocal uptake in the anterior left seventh rib seen on prior bone scan is not appreciable currently. Kidneys are noted in the flank positions bilaterally. IMPRESSION: Abnormal radiotracer uptake at T7 and T8 at the sites of recent kyphoplasty procedures. Stable increased uptake in the mid lower lumbar regions, likely of arthropathic etiology. Elsewhere, the distribution of uptake is unremarkable. There are no findings felt to be indicative of bony metastatic disease. Flank kidneys noted bilaterally. Electronically Signed   By: WLowella GripIII M.D.   On: 06/04/2017 13:25   UKoreaRenal  Result Date: 06/24/2017 CLINICAL DATA:  Recurrent UTIs EXAM: RENAL / URINARY TRACT ULTRASOUND COMPLETE COMPARISON:  None. FINDINGS: Right Kidney: Length: 9.6 cm, cortical thinning. Echogenicity within normal limits. No mass or hydronephrosis visualized. Left Kidney: Length: 9.1 cm, cortical thinning. Echogenicity within normal limits. No mass or hydronephrosis visualized. Bladder: Appears normal for degree of  bladder distention. IMPRESSION: Cortical thinning bilaterally.  No hydronephrosis or acute findings. Electronically Signed   By: KRolm BaptiseM.D.   On: 06/24/2017 08:13    Assessment:   CCALAIS SVEHLAis a 77y.o. female  with hx of metastatic squamous cell carcinoma of the left lower lobe with carboplatin, Abraxane, and pembrolizumab admitted with weakness, fall and UTI. BCX and UCX + E coli. She has had otpt UA and UCX growing ESBL E coli She has recently seen urology and had neg USS. Was planned for Cysto  Recommendations Cont meropenem If has ESBL E coli in blood will need 10 days IV ertapenem through portacath. Unlikely to need removal of portacath Will need continued urological work up Check PVR  Thank you very much for allowing me to participate in the care of this patient. Please call with questions.   DCheral Marker FOla Spurr MD

## 2017-07-01 NOTE — Evaluation (Signed)
Physical Therapy Evaluation Patient Details Name: [name] [name] MRN: [id number] DOB: 06/08/1940 Today's Date: 07/01/2017   History of Present Illness  77 yo female with admission for encephalopathy and UTI has been referred to PT for mobility assessment.  Had recent admission for kyphoplasty on 5/10, has had a fall and now concern for safety. Currently is on chemo and radiation for lung CA.  PMHx: breast ca, lung ca, asthma, chronic resp failure, COPD, CHF, Neuropathy, RA, HTN, chronic O2 use  Clinical Impression  Pt is up to walk with assistance but with close cues for safety and clearance of obstacles.  Her plan is to see acutely and progress her for home with family and ask HHPT to follow up for balance and her reduced mobility with ongoing CA therapy and UTI.  Follow for LE strength and balance, endurance.    Follow Up Recommendations Home health PT;Supervision for mobility/OOB    Equipment Recommendations  Rolling walker with 5" wheels(if hers is not in good repair)    Recommendations for Other Services       Precautions / Restrictions Precautions Precautions: Fall(telemetry) Restrictions Weight Bearing Restrictions: No      Mobility  Bed Mobility Overal bed mobility: Needs Assistance Bed Mobility: Supine to Sit     Supine to sit: Min assist     General bed mobility comments: light assistance to lift up from bed and finish scooting to EOB  Transfers Overall transfer level: Needs assistance Equipment used: Rolling walker (2 wheeled);1 person hand held assist Transfers: Sit to/from Stand Sit to Stand: Min guard;Min assist         General transfer comment: light assistance to power up and get control of initial standing  Ambulation/Gait Ambulation/Gait assistance: Min guard Gait Distance (Feet): 40 Feet Assistive device: Rolling walker (2 wheeled) Gait Pattern/deviations: Step-through pattern;Step-to pattern;Shuffle;Wide base of support;Decreased stride  length Gait velocity: reduced Gait velocity interpretation: <1.31 ft/sec, indicative of household ambulator General Gait Details: wide base with slow turns, cued her for safety  Stairs            Wheelchair Mobility    Modified Rankin (Stroke Patients Only)       Balance Overall balance assessment: Needs assistance Sitting-balance support: Feet supported Sitting balance-Leahy Scale: Good     Standing balance support: Bilateral upper extremity supported;During functional activity Standing balance-Leahy Scale: Fair Standing balance comment: less than fair dynamically                             Pertinent Vitals/Pain Pain Assessment: No/denies pain    Home Living Family/patient expects to be discharged to:: Private residence Living Arrangements: Children Available Help at Discharge: Family;Available 24 hours/day Type of Home: House Home Access: Stairs to enter Entrance Stairs-Rails: Right;Left;Can reach both Entrance Stairs-Number of Steps: 2 Home Layout: One level Home Equipment: Walker - 2 wheels;Cane - single point Additional Comments: was on 2L )2 previously with HHPT    Prior Function Level of Independence: Independent with assistive device(s)         Comments: states she was on cane and nothing more recently     Hand Dominance   Dominant Hand: Right    Extremity/Trunk Assessment   Upper Extremity Assessment Upper Extremity Assessment: Overall WFL for tasks assessed    Lower Extremity Assessment Lower Extremity Assessment: Generalized weakness    Cervical / Trunk Assessment Cervical / Trunk Assessment: Kyphotic  Communication   Communication: No difficulties  Cognition Arousal/Alertness: Awake/alert Behavior During Therapy: WFL for tasks assessed/performed Overall Cognitive Status: No family/caregiver present to determine baseline cognitive functioning                                 General Comments: pt has some  mild confusion      General Comments      Exercises     Assessment/Plan    PT Assessment Patient needs continued PT services  PT Problem List Decreased strength;Decreased range of motion;Decreased activity tolerance;Decreased balance;Decreased mobility;Decreased coordination;Decreased knowledge of use of DME;Decreased safety awareness;Cardiopulmonary status limiting activity       PT Treatment Interventions DME instruction;Gait training;Stair training;Functional mobility training;Therapeutic activities;Therapeutic exercise;Balance training;Neuromuscular re-education;Patient/family education    PT Goals (Current goals can be found in the Care Plan section)  Acute Rehab PT Goals Patient Stated Goal: to walk and get stronger PT Goal Formulation: With patient Time For Goal Achievement: 07/15/17 Potential to Achieve Goals: Good    Frequency Min 2X/week   Barriers to discharge Inaccessible home environment 2 steps to enter house    Co-evaluation               AM-PAC PT "6 Clicks" Daily Activity  Outcome Measure Difficulty turning over in bed (including adjusting bedclothes, sheets and blankets)?: A Little Difficulty moving from lying on back to sitting on the side of the bed? : Unable Difficulty sitting down on and standing up from a chair with arms (e.g., wheelchair, bedside commode, etc,.)?: Unable Help needed moving to and from a bed to chair (including a wheelchair)?: A Little Help needed walking in hospital room?: A Little Help needed climbing 3-5 steps with a railing? : A Little 6 Click Score: 14    End of Session Equipment Utilized During Treatment: Gait belt Activity Tolerance: Patient tolerated treatment well;Patient limited by fatigue Patient left: in chair;with call bell/phone within reach;with chair alarm set Nurse Communication: Mobility status PT Visit Diagnosis: Unsteadiness on feet (R26.81);Muscle weakness (generalized) (M62.81)    Time:  5056-9794 PT Time Calculation (min) (ACUTE ONLY): 22 min   Charges:   PT Evaluation $PT Eval Moderate Complexity: 1 Mod     PT G Codes:   PT G-Codes **NOT FOR INPATIENT CLASS** Functional Assessment Tool Used: AM-PAC 6 Clicks Basic Mobility    Ramond Dial 07/01/2017, 12:30 PM   Mee Hives, PT MS Acute Rehab Dept. Number: [place] [facility] [place]

## 2017-07-01 NOTE — Progress Notes (Signed)
[facility] NAME: [name] [name]    MR#:  [id number]  DATE OF BIRTH:  [birth year]  SUBJECTIVE:  CHIEF COMPLAINT:   Chief Complaint  Patient presents with  . Fall  . Altered Mental Status   -Came in with weakness and fall and noted to have UTI. -Blood cultures positive for gram-negative rods  REVIEW OF SYSTEMS:  Review of Systems  Constitutional: Positive for malaise/fatigue. Negative for chills and fever.  HENT: Negative for congestion, ear discharge, hearing loss and nosebleeds.   Eyes: Negative for blurred vision and double vision.  Respiratory: Positive for shortness of breath. Negative for cough and wheezing.   Cardiovascular: Negative for chest pain and palpitations.  Gastrointestinal: Negative for abdominal pain, constipation, diarrhea, nausea and vomiting.  Genitourinary: Negative for dysuria.  Musculoskeletal: Positive for myalgias.  Neurological: Negative for dizziness, focal weakness, seizures, weakness and headaches.  Psychiatric/Behavioral: Negative for depression.    DRUG ALLERGIES:   Allergies  Allergen Reactions  . Contrast Media [Iodinated Diagnostic Agents] Shortness Of Breath    Acute onset of shortness of breath following CT contrast administration 06/18/17. Sent to the ED. Dr. Kerman Passey documented incident as an allergic reaction. Needs premedications prior to future contrast media injections. Previous questionable contrast allergy, so the patient was given entire or partial premedications without complications. She was not given any premedications on 06/18/17, resulting in shortness of breath and an ED visit.  . Sulfa Antibiotics Other (See Comments)    Unknown  . Tetracycline Hives  . White Petrolatum Other (See Comments)    Blisters  . Amoxicillin-Pot Clavulanate Rash and Other (See Comments)    Blisters in mouth Has patient had a PCN reaction causing immediate rash, facial/tongue/throat swelling, SOB  or lightheadedness with hypotension: No Has patient had a PCN reaction causing severe rash involving mucus membranes or skin necrosis: No Has patient had a PCN reaction that required hospitalization: No Has patient had a PCN reaction occurring within the last 10 years: No If all of the above answers are "NO", then may proceed with Cephalosporin use.   . Tape Rash    VITALS:  Blood pressure (!) 118/53, pulse 75, temperature 98.1 F (36.7 C), temperature source Oral, resp. rate 16, height 5\' 1"  (1.549 m), weight 49.9 kg (110 lb), SpO2 97 %.  PHYSICAL EXAMINATION:  Physical Exam  GENERAL:  77 y.o.-year-old patient lying in the bed with no acute distress.  EYES: Pupils equal, round, reactive to light and accommodation. No scleral icterus. Extraocular muscles intact.  HEENT: Head atraumatic, normocephalic. Oropharynx and nasopharynx clear.  NECK:  Supple, no jugular venous distention. No thyroid enlargement, no tenderness.  LUNGS: Normal breath sounds bilaterally, no wheezing, rales,rhonchi or crepitation. No use of accessory muscles of respiration. Coarse breath sounds right base.  CARDIOVASCULAR: S1, S2 normal. No  rubs, or gallops. 2/6 systolic murmur present ABDOMEN: Soft, nontender, nondistended. Bowel sounds present. No organomegaly or mass.  EXTREMITIES: No pedal edema, cyanosis, or clubbing.  NEUROLOGIC: Cranial nerves II through XII are intact. Muscle strength 5/5 in all extremities. Sensation intact. Gait not checked. Global weakness noted. PSYCHIATRIC: The patient is alert and oriented x 3.  SKIN: No obvious rash, lesion, or ulcer.    LABORATORY PANEL:   CBC Recent Labs  Lab 06/30/17 0934  WBC 12.3*  HGB 11.1*  HCT 33.0*  PLT 129*   ------------------------------------------------------------------------------------------------------------------  Chemistries  Recent Labs  Lab 06/30/17 0934 07/01/17 0516  NA 130*  130*  K 4.9 3.6  CL 92* 93*  CO2 29 28  GLUCOSE  102* 88  BUN 20 20  CREATININE 1.17* 1.20*  CALCIUM 8.5* 8.2*  AST 23  --   ALT 13*  --   ALKPHOS 95  --   BILITOT 1.0  --    ------------------------------------------------------------------------------------------------------------------  Cardiac Enzymes Recent Labs  Lab 06/30/17 0934  TROPONINI 0.09*   ------------------------------------------------------------------------------------------------------------------  RADIOLOGY:  Ct Head Wo Contrast  Result Date: 06/30/2017 CLINICAL DATA:  Altered level of consciousness. EXAM: CT HEAD WITHOUT CONTRAST TECHNIQUE: Contiguous axial images were obtained from the base of the skull through the vertex without intravenous contrast. COMPARISON:  12/11/2016 FINDINGS: Brain: Diffuse cerebral atrophy, most notable over the frontal lobes. No acute intracranial abnormality. Specifically, no hemorrhage, hydrocephalus, mass lesion, acute infarction, or significant intracranial injury. Vascular: No hyperdense vessel or unexpected calcification. Skull: No acute calvarial abnormality. Sinuses/Orbits: Postoperative changes in the right paranasal sinuses. No acute findings. Other: None IMPRESSION: No acute intracranial abnormality. Electronically Signed   By: Rolm Baptise M.D.   On: 06/30/2017 09:49    EKG:   Orders placed or performed in visit on 06/30/17  . 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  Acute encephalopathy-secondary to underlying infection and hyponatremia -much improved mental status now.  2.  Bacteremia with recurrent UTI-blood cultures growing gram-negative rods and urine cultures growing E. coli. -Patient has history of ESBL E. coli in the past. -Follow-up cultures -Continue  meropenem  3.  Hyponatremia-continue IV fluids.  Also could be from underlying lung cancer.  Has SIADH  4.  Hypertension-continue Coreg  5.  Chronic pain-continue fentanyl patch and home pain medications.  6.  DVT prophylaxis-Lovenox  Physical therapy consulted   All the records are reviewed and case discussed with Care Management/Social Workerr. Management plans discussed with the patient, family and they are in agreement.  CODE STATUS: Full Code  TOTAL TIME TAKING CARE OF THIS PATIENT: 37 minutes.   POSSIBLE D/C IN 1-2  DAYS, DEPENDING ON CLINICAL CONDITION.   Gladstone Lighter M.D on 07/01/2017 at 1:24 PM  Between 7am to 6pm - Pager - [phone]  After 6pm go to [email or web address] - password [facility]  Office  [phone]  CC: Primary care physician; Tracie Harrier, MD

## 2017-07-02 ENCOUNTER — Ambulatory Visit: Payer: Medicare HMO

## 2017-07-02 DIAGNOSIS — C3490 Malignant neoplasm of unspecified part of unspecified bronchus or lung: Secondary | ICD-10-CM | POA: Diagnosis not present

## 2017-07-02 DIAGNOSIS — A499 Bacterial infection, unspecified: Secondary | ICD-10-CM | POA: Diagnosis not present

## 2017-07-02 DIAGNOSIS — R7881 Bacteremia: Secondary | ICD-10-CM | POA: Diagnosis not present

## 2017-07-02 DIAGNOSIS — N390 Urinary tract infection, site not specified: Secondary | ICD-10-CM | POA: Diagnosis not present

## 2017-07-02 DIAGNOSIS — E871 Hypo-osmolality and hyponatremia: Secondary | ICD-10-CM | POA: Diagnosis not present

## 2017-07-02 LAB — URINE CULTURE: Culture: >=100000 — AB

## 2017-07-02 MED ORDER — SODIUM CHLORIDE 0.9 % IV SOLN
1.0000 g | INTRAVENOUS | Status: DC
  Administered 2017-07-02: 11:00:00 1000 mg via INTRAVENOUS
  Filled 2017-07-02 (×2): qty 1

## 2017-07-02 MED ORDER — ERTAPENEM IV (FOR PTA / DISCHARGE USE ONLY): 1.0000 g | INTRAVENOUS | 0 refills | Status: AC

## 2017-07-02 MED ORDER — LIDOCAINE-PRILOCAINE 2.5-2.5 % EX CREA
TOPICAL_CREAM | Freq: Once | CUTANEOUS | Status: AC
  Administered 2017-07-02: 15:00:00 via TOPICAL
  Filled 2017-07-02 (×2): qty 5

## 2017-07-02 NOTE — Progress Notes (Signed)
[facility]FECTI[facility] PROGRESS NOTE Date of Admission:  06/30/2017     ID: [name] [name] is a 77 y.o. female with ESBL E coli bacteremia UTI   Active Problems:   Encephalopathy acute   Subjective: No fevers. Ready to go home.  ROS  Eleven systems are reviewed and negative except per hpi  Medications:  Antibiotics Given (last 72 hours)    Date/Time Action Medication Dose Rate   06/30/17 1108 New Bag/Given   piperacillin-tazobactam (ZOSYN) IVPB 3.375 g 3.375 g 100 mL/hr   06/30/17 1141 New Bag/Given   vancomycin (VANCOCIN) IVPB 1000 mg/200 mL premix 1,000 mg 200 mL/hr   06/30/17 1318 New Bag/Given   cefTRIAXone (ROCEPHIN) 1 g in sodium chloride 0.9 % 100 mL IVPB 1 g 200 mL/hr   06/30/17 2106 New Bag/Given   meropenem (MERREM) 1 g in sodium chloride 0.9 % 100 mL IVPB 1 g 200 mL/hr   07/01/17 0934 New Bag/Given   meropenem (MERREM) 1 g in sodium chloride 0.9 % 100 mL IVPB 1 g 200 mL/hr   07/01/17 2257 New Bag/Given   meropenem (MERREM) 1 g in sodium chloride 0.9 % 100 mL IVPB 1 g 200 mL/hr     . calcium-vitamin D  1 tablet Oral Daily  . carvedilol  12.5 mg Oral BID WC  . cholecalciferol  1,000 Units Oral Daily  . enoxaparin (LOVENOX) injection  40 mg Subcutaneous Q24H  . fentaNYL  50 mcg Transdermal Q72H  . gabapentin  600 mg Oral TID  . imipramine  25 mg Oral QHS  . mirabegron ER  25 mg Oral Daily  . multivitamin with minerals  1 tablet Oral Daily  . senna  1 tablet Oral BID  . tiotropium  1 capsule Inhalation BID  . vitamin C  500 mg Oral Daily  . zinc sulfate  220 mg Oral Daily  . zolpidem  5 mg Oral QHS    Objective: Vital signs in last 24 hours: Temp:  [98.2 F (36.8 C)-99.9 F (37.7 C)] 98.2 F (36.8 C) (06/20 0353) Pulse Rate:  [84-90] 84 (06/20 0353) Resp:  [17-18] 17 (06/20 0353) BP: (96-113)/(46-83) 96/83 (06/20 0353) SpO2:  [93 %-98 %] 93 % (06/20 0353) Constitutional:  Thin, chornically ill appearing but NAD HENT: [place]/AT, PERRLA, no  scleral icterus Mouth/Throat: Oropharynx is clear and dry Cardiovascular: Normal rate, regular rhythm and normal heart sounds. Pulmonary/Chest: dec BS L basae  Portacath R chest wall wnl   Neck = supple, no nuchal rigidity Abdominal: Soft. Bowel sounds are normal.  exhibits no distension. There is no tenderness.  Lymphadenopathy: no cervical adenopathy. No axillary adenopathy Neurological: alert and oriented to person, place, and time.  Skin: Skin is warm and dry. No rash noted. No erythema.  Psychiatric: a normal mood and affect.  behavior is normal.   Lab Results Recent Labs    06/30/17 0934 07/01/17 0516  WBC 12.3*  --   HGB 11.1*  --   HCT 33.0*  --   NA 130* 130*  K 4.9 3.6  CL 92* 93*  CO2 29 28  BUN 20 20  CREATININE 1.17* 1.20*    Microbiology: Results for orders placed or performed during the hospital encounter of 06/30/17  Blood culture (routine x 2)     Status: Abnormal (Preliminary result)   Collection Time: 06/30/17  9:34 AM  Result Value Ref Range Status   Specimen Description   Final    BLOOD LEFT ANTECUBITAL Performed at  [facility], [address], [place], [place] [address]    Special Requests   Final    BOTTLES DRAWN AEROBIC AND ANAEROBIC Blood Culture results may not be optimal due to an excessive volume of blood received in culture bottles Performed at [facility], [address], [place], [place] [address]    Culture  Setup Time   Final    GRAM NEGATIVE RODS IN BOTH AEROBIC AND ANAEROBIC BOTTLES CRITICAL VALUE NOTED.  VALUE IS CONSISTENT WITH PREVIOUSLY REPORTED AND CALLED VALUE. Performed at [facility], [address] [place], [place] [address]    Culture ESCHERICHIA COLI (A)  Final   Report Status PENDING  Incomplete  Blood culture (routine x 2)     Status: Abnormal (Preliminary result)   Collection Time: 06/30/17  9:34 AM  Result Value Ref Range Status   Specimen Description   Final    BLOOD RIGHT  ANTECUBITAL Performed at [facility], [address] [place], [place] [address]    Special Requests   Final    BOTTLES DRAWN AEROBIC AND ANAEROBIC Blood Culture adequate volume Performed at [facility], [address], [place], [place] [address]    Culture  Setup Time   Final    GRAM NEGATIVE RODS IN BOTH AEROBIC AND ANAEROBIC BOTTLES CRITICAL RESULT CALLED TO, READ BACK BY AND VERIFIED WITH: JASON ROBBINS @2039  06/30/17 AKT Performed at [facility], [place] [address], [place], [place] [address]    Culture ESCHERICHIA COLI (A)  Final   Report Status PENDING  Incomplete  Urine culture     Status: Abnormal   Collection Time: 06/30/17  9:34 AM  Result Value Ref Range Status   Specimen Description   Final    URINE, CATHETERIZED Performed at [facility], [address], [place], [place] [address]    Special Requests   Final    Immunocompromised Performed at [facility], [address] [place], [place] [address]    Culture (A)  Final    >=100,000 COLONIES/mL ESCHERICHIA COLI Confirmed Extended Spectrum Beta-Lactamase Producer (ESBL).  In bloodstream infections from ESBL organisms, carbapenems are preferred over piperacillin/tazobactam. They are shown to have a lower risk of mortality. Performed at [facility], [place] [address], [place], [place] [address]    Report Status 07/02/2017 FINAL  Final   Organism ID, Bacteria ESCHERICHIA COLI (A)  Final      Susceptibility   Escherichia coli - MIC*    AMPICILLIN >=32 RESISTANT Resistant     CEFAZOLIN >=64 RESISTANT Resistant     CEFTRIAXONE >=64 RESISTANT Resistant     CIPROFLOXACIN >=4 RESISTANT Resistant     GENTAMICIN <=1 SENSITIVE Sensitive     IMIPENEM <=0.25 SENSITIVE Sensitive     NITROFURANTOIN 32 SENSITIVE Sensitive     TRIMETH/SULFA >=320 RESISTANT Resistant     AMPICILLIN/SULBACTAM >=32 RESISTANT Resistant     PIP/TAZO <=4 SENSITIVE Sensitive     Extended ESBL POSITIVE  Resistant     * >=100,000 COLONIES/mL ESCHERICHIA COLI  Blood Culture ID Panel (Reflexed)     Status: Abnormal   Collection Time: 06/30/17  9:34 AM  Result Value Ref Range Status   Enterococcus species NOT DETECTED NOT DETECTED Final   Listeria monocytogenes NOT DETECTED NOT DETECTED Final   Staphylococcus species NOT DETECTED NOT DETECTED Final   Staphylococcus aureus NOT DETECTED NOT DETECTED Final   Streptococcus species NOT DETECTED NOT DETECTED Final   Streptococcus agalactiae  NOT DETECTED NOT DETECTED Final   Streptococcus pneumoniae NOT DETECTED NOT DETECTED Final   Streptococcus pyogenes NOT DETECTED NOT DETECTED Final   Acinetobacter baumannii NOT DETECTED NOT DETECTED Final   Enterobacteriaceae species DETECTED (A) NOT DETECTED Final    Comment: Enterobacteriaceae represent a large family of gram-negative bacteria, not a single organism. CRITICAL RESULT CALLED TO, READ BACK BY AND VERIFIED WITH: JASON ROBBINS @2039  06/30/17 AKT    Enterobacter cloacae complex NOT DETECTED NOT DETECTED Final   Escherichia coli DETECTED (A) NOT DETECTED Final    Comment: CRITICAL RESULT CALLED TO, READ BACK BY AND VERIFIED WITH: JASON ROBBINS @2039  06/30/17 AKT    Klebsiella oxytoca NOT DETECTED NOT DETECTED Final   Klebsiella pneumoniae NOT DETECTED NOT DETECTED Final   Proteus species NOT DETECTED NOT DETECTED Final   Serratia marcescens NOT DETECTED NOT DETECTED Final   Carbapenem resistance NOT DETECTED NOT DETECTED Final   Haemophilus influenzae NOT DETECTED NOT DETECTED Final   Neisseria meningitidis NOT DETECTED NOT DETECTED Final   Pseudomonas aeruginosa NOT DETECTED NOT DETECTED Final   Candida albicans NOT DETECTED NOT DETECTED Final   Candida glabrata NOT DETECTED NOT DETECTED Final   Candida krusei NOT DETECTED NOT DETECTED Final   Candida parapsilosis NOT DETECTED NOT DETECTED Final   Candida tropicalis NOT DETECTED NOT DETECTED Final    Comment: Performed at [facility], [address], [place], [place] [address]    Studies/Results: No results found.  Assessment/Plan: TITIANA SEVERA is a 77 y.o. female  with hx of metastatic squamous cell carcinoma of the left lower lobewithcarboplatin, Abraxane, and pembrolizumabadmitted with weakness, fall and UTI. BCX and UCX + E coli. She has had otpt UA and UCX growing ESBL E coli She has recently seen urology and had neg USS. Was planned for Cysto as otpt. Cx with EBSL on urine.   Recommendations Since has  ESBL E coli in blood will need 10 days IV ertapenem through portacath. Unlikely to need removal of portacath Will need continued urological work up NO need for ID fu  Thank you very much for the consult. Will follow with you.  Leonel Ramsay   07/02/2017, 10:32 AM

## 2017-07-02 NOTE — Discharge Summary (Signed)
[facility] NAME: [name] [name]    MR#:  [id number]  DATE OF BIRTH:  09-02-1940  DATE OF ADMISSION:  06/30/2017   ADMITTING PHYSICIAN: Fritzi Mandes, MD  DATE OF DISCHARGE: 07/02/17  PRIMARY CARE PHYSICIAN: Tracie Harrier, MD   ADMISSION DIAGNOSIS:   Lower urinary tract infectious disease [N39.0] Elevated troponin I level [R74.8] Fall, initial encounter [W19.XXXA]  DISCHARGE DIAGNOSIS:   Active Problems:   Encephalopathy acute   SECONDARY DIAGNOSIS:   Past Medical History:  Diagnosis Date  . Asthma   . Breast cancer ([place]) 2009   left  . Cancer ([place])   . CHF (congestive heart failure) ([place])   . CHF (congestive heart failure) ([employer])   . Chronic UTI   . COPD (chronic obstructive pulmonary disease) ([place])   . Dizziness   . Fibromyalgia   . Hypertension   . Neuropathy   . Personal history of tobacco use, presenting hazards to health 05/17/2015  . Polyp, larynx   . RA (rheumatoid arthritis) ([place])   . Sinus infection    recent  . Stumbling gait    to the left  . Supplemental oxygen dependent    2.5l    HOSPITAL COURSE:   77 year old female with past medical history significant for metastatic squamous cell cancer of left lower lobe on chemotherapy, COPD on 2 and half liters of home oxygen, hypertension, fibromyalgia presents to hospital secondary to weakness and falls  1.  Acute encephalopathy-secondary to underlying infection and hyponatremia -improved mental status now.  2.  Bacteremia with recurrent UTI-blood cultures and urine cultures growing ESBL E. coli. -Patient also has history of ESBL E. coli in the past. -received meropenem here- will discharge on Invanz for 7 more days starting tomorrow. -Can be administered through her Port-A-Cath  3.  Hyponatremia-received IV fluids.  Also could be from underlying lung cancer.  Has SIADH  4.  Hypertension-continue Coreg  5.  Chronic pain-continue fentanyl  patch and home pain medications.  Seems to be at baseline.  Will be discharged today with home health services   DISCHARGE CONDITIONS:   Guarded  CONSULTS OBTAINED:   Treatment Team:  Leonel Ramsay, MD  DRUG ALLERGIES:   Allergies  Allergen Reactions  . Contrast Media [Iodinated Diagnostic Agents] Shortness Of Breath    Acute onset of shortness of breath following CT contrast administration 06/18/17. Sent to the ED. Dr. Kerman Passey documented incident as an allergic reaction. Needs premedications prior to future contrast media injections. Previous questionable contrast allergy, so the patient was given entire or partial premedications without complications. She was not given any premedications on 06/18/17, resulting in shortness of breath and an ED visit.  . Sulfa Antibiotics Other (See Comments)    Unknown  . Tetracycline Hives  . White Petrolatum Other (See Comments)    Blisters  . Amoxicillin-Pot Clavulanate Rash and Other (See Comments)    Blisters in mouth Has patient had a PCN reaction causing immediate rash, facial/tongue/throat swelling, SOB or lightheadedness with hypotension: No Has patient had a PCN reaction causing severe rash involving mucus membranes or skin necrosis: No Has patient had a PCN reaction that required hospitalization: No Has patient had a PCN reaction occurring within the last 10 years: No If all of the above answers are "NO", then may proceed with Cephalosporin use.   . Tape Rash   DISCHARGE MEDICATIONS:   Allergies as of 07/02/2017      Reactions  Contrast Media [iodinated Diagnostic Agents] Shortness Of Breath   Acute onset of shortness of breath following CT contrast administration 06/18/17. Sent to the ED. Dr. Kerman Passey documented incident as an allergic reaction. Needs premedications prior to future contrast media injections. Previous questionable contrast allergy, so the patient was given entire or partial premedications without  complications. She was not given any premedications on 06/18/17, resulting in shortness of breath and an ED visit.   Sulfa Antibiotics Other (See Comments)   Unknown   Tetracycline Hives   White Petrolatum Other (See Comments)   Blisters   Amoxicillin-pot Clavulanate Rash, Other (See Comments)   Blisters in mouth Has patient had a PCN reaction causing immediate rash, facial/tongue/throat swelling, SOB or lightheadedness with hypotension: No Has patient had a PCN reaction causing severe rash involving mucus membranes or skin necrosis: No Has patient had a PCN reaction that required hospitalization: No Has patient had a PCN reaction occurring within the last 10 years: No If all of the above answers are "NO", then may proceed with Cephalosporin use.   Tape Rash      Medication List    TAKE these medications   albuterol 108 (90 Base) MCG/ACT inhaler Commonly known as:  PROVENTIL HFA;VENTOLIN HFA Inhale 1-2 puffs into the lungs every 6 (six) hours as needed for wheezing or shortness of breath.   Calcium-Vitamin D 600-200 MG-UNIT tablet Take 1 tablet by mouth daily.   carvedilol 12.5 MG tablet Commonly known as:  COREG Take 1 tablet (12.5 mg total) by mouth 2 (two) times daily with a meal.   cholecalciferol 1000 units tablet Commonly known as:  VITAMIN D Take 1,000 Units by mouth daily.   clonazePAM 1 MG tablet Commonly known as:  KLONOPIN Take 1 mg by mouth 2 (two) times daily as needed for anxiety.   ertapenem IVPB Commonly known as:  INVANZ Inject 1 g into the vein daily for 8 days. Indication:  E.Coli Bacteremia  Last Day of Therapy:  07/10/17 Labs - Once weekly:  CBC/D and BMP, SrCr  Please fax lab results to: [phone]   fentaNYL 50 MCG/HR Commonly known as:  [place] - dosed mcg/hr Place 1 patch (50 mcg total) onto the skin every 3 (three) days.   gabapentin 600 MG tablet Commonly known as:  NEURONTIN Take 600 mg by mouth 3 (three) times daily.     HYDROcodone-acetaminophen 5-325 MG tablet Commonly known as:  NORCO/VICODIN Take 1 tablet by mouth 2 (two) times daily as needed for pain.   imipramine 25 MG tablet Commonly known as:  TOFRANIL Take 25 mg by mouth at bedtime.   lidocaine-prilocaine cream Commonly known as:  EMLA Apply 1 application topically as needed.   LORazepam 0.5 MG tablet Commonly known as:  ATIVAN Take 1 tablet (0.5 mg total) by mouth every 6 (six) hours as needed (Nausea or vomiting).   mirabegron ER 25 MG Tb24 tablet Commonly known as:  MYRBETRIQ Take 1 tablet (25 mg total) by mouth daily.   multivitamin capsule Take 1 capsule by mouth daily.   NITROSTAT 0.4 MG SL tablet Generic drug:  nitroGLYCERIN Place 0.4 mg under the tongue every 5 (five) minutes as needed for chest pain.   ondansetron 8 MG tablet Commonly known as:  ZOFRAN Take 1 tablet (8 mg total) by mouth 2 (two) times daily as needed. Start on the third day after chemotherapy.   oxyCODONE 5 MG immediate release tablet Commonly known as:  Oxy IR/ROXICODONE Take 1 tablet (5 mg  total) by mouth every 6 (six) hours as needed for severe pain.   Tiotropium Bromide Monohydrate 2.5 MCG/ACT Aers Commonly known as:  SPIRIVA RESPIMAT Inhale 2.5 mcg into the lungs 2 (two) times daily.   traMADol 50 MG tablet Commonly known as:  ULTRAM Take 2 tablets (100 mg total) by mouth daily.   vitamin C 500 MG tablet Commonly known as:  ASCORBIC ACID Take 500 mg by mouth daily.   ZINC SULFATE PO Take 1 tablet by mouth daily.   zolpidem 5 MG tablet Commonly known as:  AMBIEN Take 5 mg by mouth at bedtime.            Home Infusion Instuctions  (From admission, onward)        Start     Ordered   07/02/17 0000  Home infusion instructions [facility] May follow [place] Dosing Protocol; May administer Cathflo as needed to maintain patency of vascular access device.; Flushing of vascular access device: per [facility] Protocol: 0.9% NaCl  pre/post medica...    Question Answer Comment  Instructions May follow [place] Dosing Protocol   Instructions May administer Cathflo as needed to maintain patency of vascular access device.   Instructions Flushing of vascular access device: per [facility] Protocol: 0.9% NaCl pre/post medication administration and prn patency; Heparin 100 u/ml, 8ml for implanted ports and Heparin 10u/ml, 67ml for all other central venous catheters.   Instructions May follow [facility] Anaphylaxis Protocol for First Dose Administration in the home: 0.9% NaCl at 25-50 ml/hr to maintain IV access for protocol meds. Epinephrine 0.3 ml IV/IM PRN and Benadryl 25-50 IV/IM PRN s/s of anaphylaxis.   Instructions [facility] Infusion Coordinator (RN) to assist per patient IV care needs in the home PRN.      07/02/17 1156       DISCHARGE INSTRUCTIONS:   1.  PCP follow-up in 1 to 2 weeks  2.  Oncology follow-up as per schedule 3.  ID follow-up in 1 week  DIET:   Cardiac diet  ACTIVITY:   Activity as tolerated  OXYGEN:   Home Oxygen: Yes.    Oxygen Delivery: 2 liters/min via Patient connected to nasal cannula oxygen  DISCHARGE LOCATION:   home   If you experience worsening of your admission symptoms, develop shortness of breath, life threatening emergency, suicidal or homicidal thoughts you must seek medical attention immediately by calling 911 or calling your MD immediately  if symptoms less severe.  You Must read complete instructions/literature along with all the possible adverse reactions/side effects for all the Medicines you take and that have been prescribed to you. Take any new Medicines after you have completely understood and accpet all the possible adverse reactions/side effects.   Please note  You were cared for by a hospitalist during your hospital stay. If you have any questions about your discharge medications or the care you received while you were in the hospital after you are discharged,  you can call the unit and asked to speak with the hospitalist on call if the hospitalist that took care of you is not available. Once you are discharged, your primary care physician will handle any further medical issues. Please note that NO REFILLS for any discharge medications will be authorized once you are discharged, as it is imperative that you return to your primary care physician (or establish a relationship with a primary care physician if you do not have one) for your aftercare needs so that they can reassess your  need for medications and monitor your lab values.    On the day of Discharge:  VITAL SIGNS:   Blood pressure 96/83, pulse 84, temperature 98.2 F (36.8 C), temperature source Oral, resp. rate 17, height 5\' 1"  (1.549 m), weight 49.9 kg (110 lb), SpO2 93 %.  PHYSICAL EXAMINATION:   GENERAL:  78 y.o.-year-old patient lying in the bed with no acute distress.  EYES: Pupils equal, round, reactive to light and accommodation. No scleral icterus. Extraocular muscles intact.  HEENT: Head atraumatic, normocephalic. Oropharynx and nasopharynx clear.  NECK:  Supple, no jugular venous distention. No thyroid enlargement, no tenderness.  LUNGS: Normal breath sounds bilaterally, no wheezing, rales,rhonchi or crepitation. No use of accessory muscles of respiration. Coarse breath sounds right base.  CARDIOVASCULAR: S1, S2 normal. No  rubs, or gallops. 2/6 systolic murmur present ABDOMEN: Soft, nontender, nondistended. Bowel sounds present. No organomegaly or mass.  EXTREMITIES: No pedal edema, cyanosis, or clubbing.  NEUROLOGIC: Cranial nerves II through XII are intact. Muscle strength 5/5 in all extremities. Sensation intact. Gait not checked. Global weakness noted. PSYCHIATRIC: The patient is alert and oriented x 3.  SKIN: No obvious rash, lesion, or ulcer.    DATA REVIEW:   CBC Recent Labs  Lab 06/30/17 0934  WBC 12.3*  HGB 11.1*  HCT 33.0*  PLT 129*    Chemistries  Recent  Labs  Lab 06/30/17 0934 07/01/17 0516  NA 130* 130*  K 4.9 3.6  CL 92* 93*  CO2 29 28  GLUCOSE 102* 88  BUN 20 20  CREATININE 1.17* 1.20*  CALCIUM 8.5* 8.2*  AST 23  --   ALT 13*  --   ALKPHOS 95  --   BILITOT 1.0  --      Microbiology Results  Results for orders placed or performed during the hospital encounter of 06/30/17  Blood culture (routine x 2)     Status: Abnormal (Preliminary result)   Collection Time: 06/30/17  9:34 AM  Result Value Ref Range Status   Specimen Description   Final    BLOOD LEFT ANTECUBITAL Performed at [facility], [address], [place], [place] [address]    Special Requests   Final    BOTTLES DRAWN AEROBIC AND ANAEROBIC Blood Culture results may not be optimal due to an excessive volume of blood received in culture bottles Performed at [facility], [address] [place], [place] [address]    Culture  Setup Time   Final    GRAM NEGATIVE RODS IN BOTH AEROBIC AND ANAEROBIC BOTTLES CRITICAL VALUE NOTED.  VALUE IS CONSISTENT WITH PREVIOUSLY REPORTED AND CALLED VALUE. Performed at [facility], [facility], [address] [place], [place] [address]    Culture ESCHERICHIA COLI (A)  Final   Report Status PENDING  Incomplete  Blood culture (routine x 2)     Status: Abnormal (Preliminary result)   Collection Time: 06/30/17  9:34 AM  Result Value Ref Range Status   Specimen Description   Final    BLOOD RIGHT ANTECUBITAL Performed at [facility], [address] [place], [place] [address]    Special Requests   Final    BOTTLES DRAWN AEROBIC AND ANAEROBIC Blood Culture adequate volume Performed at [facility], [place], [place] [address]    Culture  Setup Time   Final    GRAM NEGATIVE RODS IN BOTH AEROBIC AND ANAEROBIC BOTTLES CRITICAL RESULT CALLED TO, READ BACK BY AND VERIFIED WITH: JASON ROBBINS @2039  06/30/17  AKT Performed at [facility], [place] [address], [place], [place]  [address]    Culture ESCHERICHIA COLI (A)  Final   Report Status PENDING  Incomplete  Urine culture     Status: Abnormal   Collection Time: 06/30/17  9:34 AM  Result Value Ref Range Status   Specimen Description   Final    URINE, CATHETERIZED Performed at [facility], [address] [place] [address] [place], [place] [address]    Special Requests   Final    Immunocompromised Performed at [facility], [address] [place], [place] [address]    Culture (A)  Final    >=100,000 COLONIES/mL ESCHERICHIA COLI Confirmed Extended Spectrum Beta-Lactamase Producer (ESBL).  In bloodstream infections from ESBL organisms, carbapenems are preferred over piperacillin/tazobactam. They are shown to have a lower risk of mortality. Performed at [facility], [place] [address] [place], [place] [address]    Report Status 07/02/2017 FINAL  Final   Organism ID, Bacteria ESCHERICHIA COLI (A)  Final      Susceptibility   Escherichia coli - MIC*    AMPICILLIN >=32 RESISTANT Resistant     CEFAZOLIN >=64 RESISTANT Resistant     CEFTRIAXONE >=64 RESISTANT Resistant     CIPROFLOXACIN >=4 RESISTANT Resistant     GENTAMICIN <=1 SENSITIVE Sensitive     IMIPENEM <=0.25 SENSITIVE Sensitive     NITROFURANTOIN 32 SENSITIVE Sensitive     TRIMETH/SULFA >=320 RESISTANT Resistant     AMPICILLIN/SULBACTAM >=32 RESISTANT Resistant     PIP/TAZO <=4 SENSITIVE Sensitive     Extended ESBL POSITIVE Resistant     * >=100,000 COLONIES/mL ESCHERICHIA COLI  Blood Culture ID Panel (Reflexed)     Status: Abnormal   Collection Time: 06/30/17  9:34 AM  Result Value Ref Range Status   Enterococcus species NOT DETECTED NOT DETECTED Final   Listeria monocytogenes NOT DETECTED NOT DETECTED Final   Staphylococcus species NOT DETECTED NOT DETECTED Final   Staphylococcus aureus NOT DETECTED NOT DETECTED Final   Streptococcus species NOT DETECTED NOT DETECTED Final   Streptococcus agalactiae NOT DETECTED NOT DETECTED Final    Streptococcus pneumoniae NOT DETECTED NOT DETECTED Final   Streptococcus pyogenes NOT DETECTED NOT DETECTED Final   Acinetobacter baumannii NOT DETECTED NOT DETECTED Final   Enterobacteriaceae species DETECTED (A) NOT DETECTED Final    Comment: Enterobacteriaceae represent a large family of gram-negative bacteria, not a single organism. CRITICAL RESULT CALLED TO, READ BACK BY AND VERIFIED WITH: JASON ROBBINS @2039  06/30/17 AKT    Enterobacter cloacae complex NOT DETECTED NOT DETECTED Final   Escherichia coli DETECTED (A) NOT DETECTED Final    Comment: CRITICAL RESULT CALLED TO, READ BACK BY AND VERIFIED WITH: JASON ROBBINS @2039  06/30/17 AKT    Klebsiella oxytoca NOT DETECTED NOT DETECTED Final   Klebsiella pneumoniae NOT DETECTED NOT DETECTED Final   Proteus species NOT DETECTED NOT DETECTED Final   Serratia marcescens NOT DETECTED NOT DETECTED Final   Carbapenem resistance NOT DETECTED NOT DETECTED Final   Haemophilus influenzae NOT DETECTED NOT DETECTED Final   Neisseria meningitidis NOT DETECTED NOT DETECTED Final   Pseudomonas aeruginosa NOT DETECTED NOT DETECTED Final   Candida albicans NOT DETECTED NOT DETECTED Final   Candida glabrata NOT DETECTED NOT DETECTED Final   Candida krusei NOT DETECTED NOT DETECTED Final   Candida parapsilosis NOT DETECTED NOT DETECTED Final   Candida tropicalis NOT DETECTED NOT DETECTED Final    Comment: Performed at [facility], [address] [place], [place] [address]    RADIOLOGY:  No results found.   Management plans discussed with the patient, family and they are in agreement.  CODE STATUS:     Code Status Orders  (From admission, onward)        Start     Ordered   06/30/17 1718  Full code  Continuous     06/30/17 1717    Code Status History    Date Active Date Inactive Code Status Order ID Comments User Context   05/21/2017 1813 05/24/2017 1542 Full Code [id number]  Demetrios Loll, MD Inpatient   03/04/2016 1835 03/06/2016  2112 Full Code [id number]  Dustin Flock, MD Inpatient   12/04/2015 1728 12/07/2015 1858 Full Code [id number]  Lytle Butte, MD ED   11/15/2015 0231 11/29/2015 1830 Full Code [id number]  Lance Coon, MD Inpatient    Advance Directive Documentation     Most Recent Value  Type of Advance Directive  Healthcare Power of Attorney, Living will  Pre-existing out of facility DNR order (yellow form or pink MOST form)  -  "MOST" Form in Place?  -      TOTAL TIME TAKING CARE OF THIS PATIENT: 38 minutes.    Regis Wiland M.D on 07/02/2017 at 12:10 PM  Between 7am to 6pm - Pager - [phone]  After 6pm go to [email or web address] - Proofreader  [facility]  Office  [phone]  CC: Primary care physician; Tracie Harrier, MD   Note: This dictation was prepared with Dragon dictation along with smaller phrase technology. Any transcriptional errors that result from this process are unintentional.

## 2017-07-02 NOTE — Progress Notes (Signed)
PHARMACY CONSULT NOTE FOR:  OUTPATIENT  PARENTERAL ANTIBIOTIC THERAPY (OPAT)  Indication: E.coli bacteremia  Regimen: ertapenem 1 gm q24h  End date: 07/10/17   IV antibiotic discharge orders are pended. To discharging provider:  please sign these orders via discharge navigator,  Select New Orders & click on the button choice - Manage This Unsigned Work.     Thank you for allowing pharmacy to be a part of this patient's care.  Candelaria Stagers, PharmD Pharmacy Resident  07/02/2017, 11:06 AM

## 2017-07-02 NOTE — Care Management (Signed)
Possible discharge to home today per Dr. Tressia Miners. Will need long term antibiotics per Dr. Ola Spurr.  Spoke with Ms. Cranfield at the bedside. [facility] for long term antibiotics and services in the home. Will update Floydene Flock, [facility] representative.  States her son will transport [name] Ammons RN MSN [place]

## 2017-07-02 NOTE — Progress Notes (Signed)
Infectious Disease Long Term IV Antibiotic Orders [name] [name] 10-18-1940  Diagnosis: ESBL E coli bacteremia  Culture results ESBL E coli  LABS Lab Results  Component Value Date   CREATININE 1.20 (H) 07/01/2017   Lab Results  Component Value Date   WBC 12.3 (H) 06/30/2017   HGB 11.1 (L) 06/30/2017   HCT 33.0 (L) 06/30/2017   MCV 93.9 06/30/2017   PLT 129 (L) 06/30/2017   No results found for: ESRSEDRATE, POCTSEDRATE No results found for: CRP  Allergies:  Allergies  Allergen Reactions  . Contrast Media [Iodinated Diagnostic Agents] Shortness Of Breath    Acute onset of shortness of breath following CT contrast administration 06/18/17. Sent to the ED. Dr. Kerman Passey documented incident as an allergic reaction. Needs premedications prior to future contrast media injections. Previous questionable contrast allergy, so the patient was given entire or partial premedications without complications. She was not given any premedications on 06/18/17, resulting in shortness of breath and an ED visit.  . Sulfa Antibiotics Other (See Comments)    Unknown  . Tetracycline Hives  . White Petrolatum Other (See Comments)    Blisters  . Amoxicillin-Pot Clavulanate Rash and Other (See Comments)    Blisters in mouth Has patient had a PCN reaction causing immediate rash, facial/tongue/throat swelling, SOB or lightheadedness with hypotension: No Has patient had a PCN reaction causing severe rash involving mucus membranes or skin necrosis: No Has patient had a PCN reaction that required hospitalization: No Has patient had a PCN reaction occurring within the last 10 years: No If all of the above answers are "NO", then may proceed with Cephalosporin use.   . Tape Rash    Discharge antibiotics  Ertapenem   1 Grams  every  24 hours  PICC Care per protocol Labs weekly while on IV antibiotics -FAX weekly labs to [phone] CBC w diff   cr Planned duration of antibiotics  7 more days  Stop  date 07/10/17 Follow up clinic date No need for ID fu   Leonel Ramsay, MD

## 2017-07-03 DIAGNOSIS — N390 Urinary tract infection, site not specified: Secondary | ICD-10-CM | POA: Diagnosis not present

## 2017-07-03 DIAGNOSIS — J45909 Unspecified asthma, uncomplicated: Secondary | ICD-10-CM | POA: Diagnosis not present

## 2017-07-03 DIAGNOSIS — B9620 Unspecified Escherichia coli [E. coli] as the cause of diseases classified elsewhere: Secondary | ICD-10-CM | POA: Diagnosis not present

## 2017-07-03 DIAGNOSIS — C3432 Malignant neoplasm of lower lobe, left bronchus or lung: Secondary | ICD-10-CM | POA: Diagnosis not present

## 2017-07-03 DIAGNOSIS — I5022 Chronic systolic (congestive) heart failure: Secondary | ICD-10-CM | POA: Diagnosis not present

## 2017-07-03 DIAGNOSIS — J449 Chronic obstructive pulmonary disease, unspecified: Secondary | ICD-10-CM | POA: Diagnosis not present

## 2017-07-03 DIAGNOSIS — J9612 Chronic respiratory failure with hypercapnia: Secondary | ICD-10-CM | POA: Diagnosis not present

## 2017-07-03 DIAGNOSIS — I110 Hypertensive heart disease with heart failure: Secondary | ICD-10-CM | POA: Diagnosis not present

## 2017-07-03 DIAGNOSIS — R7881 Bacteremia: Secondary | ICD-10-CM | POA: Diagnosis not present

## 2017-07-03 DIAGNOSIS — Z452 Encounter for adjustment and management of vascular access device: Secondary | ICD-10-CM | POA: Diagnosis not present

## 2017-07-03 LAB — CULTURE, BLOOD (ROUTINE X 2): SPECIAL REQUESTS: ADEQUATE

## 2017-07-05 NOTE — Progress Notes (Signed)
[facility] day:  07/09/2017    Chief Complaint: [name] [name] is a 77 y.o. 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  HPI: The patient was last seen in the medical oncology clinic on 06/04/2017.  At that time, patient was experiencing severe bilateral rib pain despite prescribed pain medication regimen. Patient taking Roxicodone 5 mg every 4 hours as needed.  She was on scheduled tramadol.  It was discovered that her Duragesic patches were discontinued while in the hospital.  Patient complained of fatigue.  Exam was stable.  Duragesic patches were restarted at 25 mcg.  Patient was scheduled to see orthopedics in follow-up on 06/09/2017.  She received #3 of 6 weekly B12 injection.   Patient was seen on follow-up consult on 06/04/2017 by Dr. Noreene Filbert (radiation oncology).  Notes reviewed. Patient's significant pain was felt to be referred to her ribs from her previous thoracic surgery.  Referral to pain management was suggested.  Dr. Donella Stade noted that there was no clear benefit of palliative radiation therapy at this time.  Patient scheduled follow-up with radiation oncology in 6 months.  Patient was scheduled to see Rachelle Hora, PA-C in follow-up on 06/09/2017 (orthopedics).  I had previously spoken with PA on the phone to advise him regarding continued pain status post recent kyphoplasty on 05/22/2017.  We discussed referral to physiatry for possible intercostal nerve block versus reevaluation in orthopedic clinic.  PA felt that patient would benefit from repeat MRI imaging to evaluate for additional areas of spinal compression (fracture) that could be contributory to her pain.  Patient did not attend scheduled follow-up appointment.  Patient was seen in consult on 06/11/2017 by Zara Council, PA for her recurrent urinary tract  infection. Notes reviewed. Patient with a known ESBL producing E. Coli. Patient was seen as an inpatient by Dr. Junious Silk who felt that patient's presentation was more consistent with asymptomatic bacteriuria (colonization), given the fact that she denied urinary complaints at the time of his evaluation.  Patient noted to be overtly symptomatic in the urology clinic.  UA and culture positive for recurrent UTI (> 100,000 CFU/mL ESBL producing E.coli). Cystoscopy was recommended.  She was started on a trial of Myrbetriq 25 mg daily, with plans to follow-up with urology in 3 weeks for reassessment.  Patient is scheduled for cystoscopy on 07/22/2017 with Dr. John Giovanni.  Patient returned to call to the office on 06/12/2017 to report that her pain was poorly managed on the prescribed regimen.  We had a long discussion regarding her current therapy.  She was using Duragesic 25 mcg/hr patches, and Roxicodone 5 mg (6 pills/day).  Patient noted that this was doing little to manage her pain.  Duragesic patch dose was increased to 50 mcg/hr.  She was to continue to use the  Roxicodone PRN.  Patient was encouraged to maintain a pain diary for provider review.  Patient had interval CT imaging of her chest with contrast on 06/18/2017 that demonstrated left lower lobe collapse/consolidation extending into the inferior left hilum.  This was stable in appearance.  Of note, during the procedure, patient developed wheezing and shortness of breath.  She was taken to the ED for further evaluation.  Notes reviewed.  Patient was treated with Duonebs and IV steroids which improved her overall respiratory status.  Patient was discharged home in stable condition.  Renal ultrasound performed on 06/23/2017 demonstrated no hydronephrosis or acute findings.   Patient was seen in the symptom management clinic on 06/25/2017 by Faythe Casa, NP.  At that time, patient was complaining of nonspecific chest pain, increased shortness of  breath, near syncope, and palpitations.  Previous cardiac work-up in the ED on 06/18/2017 was negative.  CBC, CMP, and troponin all within normal limits.  Patient was given a short-term course of tramadol and discharged home to follow-up with her primary care physician.  Patient presented to the ED on 06/30/2017 via EMS for complaints of altered mental status.  EMS was called by the patient's grandson when patient was found sitting in the floor having urinated on herself.  Patient found to be febrile at 100.2.  Head CT (-). WBC 12,300 ([place] 10,700).  Hemoglobin was 11.1, hematocrit 33.0, and platelets 129,000.  Sodium low at 130.  Troponin (+) at 0.09.  UA and culture (+) for recurrent UTI (> 100,000 CFU/mL ESBL producing E.coli).  Blood cultures x2 sets also (+) for ESBL producing E.coli.  Lactic acid normal.  She was treated with IV ceftriaxone and admitted to oncology unit for further observation.  She was admitted to [facility] from 06/30/2017 - [id number]/54/9826 for complicated UTI and bacteremia. She received IV meropenem.  Patient was evaluated by ID (Dr. Ola Spurr) during her admission. No need for port removal per ID. Patient discharged home on a 7 day course of intravenous Invanz and advised to follow up with PCP and oncology. Antibiotic course scheduled to complete on 07/10/2017.  In the interim, she notes that her back pain is worse.  She has an MRI next week.  She has not met with orthopedics.  Her current pain regimen is not adequate.  She states that she is "tired of being tired".   Past Medical History:  Diagnosis Date  . Asthma   . Breast cancer ([place]) 2009   left  . Cancer ([place])   . CHF (congestive heart failure) ([place])   . CHF (congestive heart failure) ([place])   . Chronic UTI   . COPD (chronic obstructive pulmonary disease) ([place])   . Dizziness   . Fibromyalgia   . Hypertension   . Neuropathy   . Personal history of tobacco use, presenting hazards to health 05/17/2015  . Polyp, larynx   . RA  (rheumatoid arthritis) ([place])   . Sinus infection    recent  . Stumbling gait    to the left  . Supplemental oxygen dependent    2.5l    Past Surgical History:  Procedure Laterality Date  . ABDOMINAL HYSTERECTOMY    . BREAST LUMPECTOMY Left 2009   chemo and radiation  . CYST EXCISION Left 02/27/2015   Procedure: CYST REMOVAL;  Surgeon: Hessie Knows, MD;  Location: [facility] ORS;  Service: Orthopedics;  Laterality: Left;  . EYE MUSCLE SURGERY Right    13 surgeries  . FLEXIBLE BRONCHOSCOPY N/A 07/01/2016   Procedure: FLEXIBLE BRONCHOSCOPY;  Surgeon: Wilhelmina Mcardle, MD;  Location: [facility] ORS;  Service: Pulmonary;  Laterality: N/A;  . FLEXIBLE BRONCHOSCOPY N/A 02/09/2017   Procedure: FLEXIBLE BRONCHOSCOPY;  Surgeon: [name], MD;  Location: [facility] ORS;  Service: Pulmonary;  Laterality: N/A;  . KYPHOPLASTY N/A 05/22/2017   Procedure: Celine Ahr;  Surgeon: Hessie Knows, MD;  Location: [facility] ORS;  Service: Orthopedics;  Laterality: N/A;  . PORTA CATH INSERTION N/A 03/04/2017   Procedure: PORTA CATH INSERTION;  Surgeon: [name], MD;  Location: [facility];  Service: Cardiovascular;  Laterality: N/A;  . THUMB ARTHROSCOPY Left     Family History  Problem Relation Age of Onset  . Diabetes Father   . Stroke Father   . Heart attack Father   . CAD Sister     Social History:  reports that she has been smoking cigarettes.  She has a 40.00 pack-year smoking history. She has never used smokeless tobacco. She reports that she does not drink alcohol or use drugs.  He lives with her son and grandson. Her grandson is in the waiting room.  The patient is alone today.   Allergies:  Allergies  Allergen Reactions  . Contrast Media [Iodinated Diagnostic Agents] Shortness Of Breath    Acute onset of shortness of breath following CT contrast administration 06/18/17. Sent to the ED. Dr. Kerman Passey documented incident as an allergic reaction. Needs premedications prior to future  contrast media injections. Previous questionable contrast allergy, so the patient was given entire or partial premedications without complications. She was not given any premedications on 06/18/17, resulting in shortness of breath and an ED visit.  . Sulfa Antibiotics Other (See Comments)    Unknown  . Tetracycline Hives  . White Petrolatum Other (See Comments)    Blisters  . Amoxicillin-Pot Clavulanate Rash and Other (See Comments)    Blisters in mouth Has patient had a PCN reaction causing immediate rash, facial/tongue/throat swelling, SOB or lightheadedness with hypotension: No Has patient had a PCN reaction causing severe rash involving mucus membranes or skin necrosis: No Has patient had a PCN reaction that required hospitalization: No Has patient had a PCN reaction occurring within the last 10 years: No If all of the above answers are "NO", then may proceed with Cephalosporin use.   . Tape Rash    Current Medications: Current Outpatient Medications  Medication Sig Dispense Refill  . albuterol (PROVENTIL HFA;VENTOLIN HFA) 108 (90 Base) MCG/ACT inhaler Inhale 1-2 puffs into the lungs every 6 (six) hours as needed for wheezing or shortness of breath. 1 Inhaler 2  . Calcium-Vitamin D 600-200 MG-UNIT tablet Take 1 tablet by mouth daily.     . carvedilol (COREG) 12.5 MG tablet Take 1 tablet (12.5 mg total) by mouth 2 (two) times daily with a meal. 60 tablet 0  . cholecalciferol (VITAMIN D) 1000 units tablet Take 1,000 Units by mouth daily.    . clonazePAM (KLONOPIN) 1 MG tablet Take 1 mg by mouth 2 (two) times daily as needed for anxiety.     . ertapenem (INVANZ) IVPB Inject 1 g into the vein daily for 8 days. Indication:  E.Coli Bacteremia  Last Day of Therapy:  07/10/17 Labs - Once weekly:  CBC/D and BMP, SrCr  Please fax lab results to: [phone] Units 0  . fentaNYL (DURAGESIC - DOSED MCG/HR) 50 MCG/HR Place 1 patch (50 mcg total) onto the skin every 3 (three) days. 5 patch 0  .  gabapentin (NEURONTIN) 600 MG tablet Take 600 mg by mouth 3 (three) times daily.     [name] HYDROcodone-acetaminophen (NORCO/VICODIN) 5-325 MG tablet Take 1 tablet by mouth 2 (two) times daily as needed for pain.    [name] imipramine (TOFRANIL) 25 MG tablet Take 25 mg by mouth at bedtime.     . lidocaine-prilocaine (EMLA) cream Apply 1 application topically as needed. 30 g 3  . LORazepam (ATIVAN) 0.5 MG tablet Take 1 tablet (0.5 mg total) by mouth every 6 (six) hours as needed (Nausea or vomiting). 30 tablet 0  .  mirabegron ER (MYRBETRIQ) 25 MG TB24 tablet Take 1 tablet (25 mg total) by mouth daily. 30 tablet 0  . Multiple Vitamin (MULTIVITAMIN) capsule Take 1 capsule by mouth daily.    . nitroGLYCERIN (NITROSTAT) 0.4 MG SL tablet Place 0.4 mg under the tongue every 5 (five) minutes as needed for chest pain.     [name] ondansetron (ZOFRAN) 8 MG tablet Take 1 tablet (8 mg total) by mouth 2 (two) times daily as needed. Start on the third day after chemotherapy. 30 tablet 1  . oxyCODONE (OXY IR/ROXICODONE) 5 MG immediate release tablet Take 1 tablet (5 mg total) by mouth every 6 (six) hours as needed for severe pain. 30 tablet 0  . Tiotropium Bromide Monohydrate (SPIRIVA RESPIMAT) 2.5 MCG/ACT AERS Inhale 2.5 mcg into the lungs 2 (two) times daily. 1 Inhaler 0  . traMADol (ULTRAM) 50 MG tablet Take 2 tablets (100 mg total) by mouth daily. 30 tablet 0  . vitamin C (ASCORBIC ACID) 500 MG tablet Take 500 mg by mouth daily.    [name] ZINC SULFATE PO Take 1 tablet by mouth daily.     [name] zolpidem (AMBIEN) 5 MG tablet Take 5 mg by mouth at bedtime.     No current facility-administered medications for this visit.    Facility-Administered Medications Ordered in Other Visits  Medication Dose Route Frequency Provider Last Rate Last Dose  . cyanocobalamin ((VITAMIN B-12)) injection 1,000 mcg  1,000 mcg Intramuscular Once Honor Loh E, NP      . heparin lock flush 100 unit/mL  500 Units Intravenous Once Lequita Asal, MD         Review of Systems  Constitutional: Positive for malaise/fatigue and weight loss (down 3 pounds). Negative for diaphoresis and fever.       Tired of being tired.  HENT: Negative.  Negative for congestion, ear pain, nosebleeds, sinus pain and sore throat.   Eyes: Negative.  Negative for blurred vision, double vision, photophobia, pain, discharge and redness.  Respiratory: Positive for cough (chronic) and shortness of breath (exertional related to COPD). Negative for hemoptysis and sputum production.        On supplemental oxygen (3L/[place]) ATC  Cardiovascular: Positive for orthopnea. Negative for chest pain, palpitations, leg swelling and PND.  Gastrointestinal: Negative for abdominal pain, blood in stool, constipation, diarrhea, melena, nausea and vomiting.  Genitourinary: Negative for dysuria, frequency, hematuria and urgency.       Recurrent URI; ESBL producing E.coli on IV antibiotics.  Musculoskeletal: Positive for back pain (chronic; kyphoplasty 05/22/2017). Negative for falls, joint pain and myalgias.       BACK pain worse.  BILATERAL ribs. Status post wedge resection.   Skin: Negative for itching and rash.  Neurological: Positive for weakness (generalized). Negative for dizziness, tremors and headaches.  Endo/Heme/Allergies: Does not bruise/bleed easily.  Psychiatric/Behavioral: Negative for depression, memory loss and suicidal ideas. The patient is not nervous/anxious and does not have insomnia.   All other systems reviewed and are negative.  Performance status (ECOG): 2 - Symptomatic, <50% confined to bed  Physical Exam: There were no vitals taken for this visit.  GENERAL:  Chronically fatigued appearing thin woman sitting comfortably in a wheelchair in the exam room in no acute distress. MENTAL STATUS:  Alert and oriented to person, place and time. HEAD:  Wearing a turquoise cap.  Alopecia totalis.  Normocephalic, atraumatic, face symmetric, no Cushingoid features. EYES:  Blue  eyes.  Pupils equal round and reactive to light and accomodation.  No conjunctivitis or scleral icterus. ENT:  [place] in place.  Oropharynx clear without lesion.  Tongue normal. Mucous membranes moist.  RESPIRATORY:  Clear to auscultation without rales, wheezes or rhonchi. CARDIOVASCULAR:  Regular rate and rhythm without murmur, rub or gallop. ABDOMEN:  Soft, non-tender, with active bowel sounds, and no hepatosplenomegaly.  No masses. BACK:  Tender to palpation mid thoracic spine (no change). SKIN:  No rashes, ulcers or lesions. EXTREMITIES: No edema, no skin discoloration or tenderness.  No palpable cords. LYMPH NODES: No palpable cervical, supraclavicular, axillary or inguinal adenopathy  NEUROLOGICAL: Unremarkable. PSYCH:  Appropriate.    No visits with results within 3 Day(s) from this visit.  Latest known visit with results is:  Admission on 06/30/2017, Discharged on 07/02/2017  Component Date Value Ref Range Status  . WBC 06/30/2017 12.3* 3.6 - 11.0 K/uL Final  . RBC 06/30/2017 3.52* 3.80 - 5.20 MIL/uL Final  . Hemoglobin 06/30/2017 11.1* 12.0 - 16.0 g/dL Final  . HCT 06/30/2017 33.0* 35.0 - 47.0 % Final  . MCV 06/30/2017 93.9  80.0 - 100.0 fL Final  . MCH 06/30/2017 31.6  26.0 - 34.0 pg Final  . MCHC 06/30/2017 33.6  32.0 - 36.0 g/dL Final  . RDW 06/30/2017 16.7* 11.5 - 14.5 % Final  . Platelets 06/30/2017 129* 150 - 440 K/uL Final  . Neutrophils Relative % 06/30/2017 87  % Final  . Neutro Abs 06/30/2017 10.7* 1.4 - 6.5 K/uL Final  . Lymphocytes Relative 06/30/2017 2  % Final  . Lymphs Abs 06/30/2017 0.3* 1.0 - 3.6 K/uL Final  . Monocytes Relative 06/30/2017 10  % Final  . Monocytes Absolute 06/30/2017 1.2* 0.2 - 0.9 K/uL Final  . Eosinophils Relative 06/30/2017 0  % Final  . Eosinophils Absolute 06/30/2017 0.0  0 - 0.7 K/uL Final  . Basophils Relative 06/30/2017 1  % Final  . Basophils Absolute 06/30/2017 0.1  0 - 0.1 K/uL Final   Performed at [facility], [address] [place], [place] [address]  . Sodium 06/30/2017 130* 135 - 145 mmol/L Final  . Potassium 06/30/2017 4.9  3.5 - 5.1 mmol/L Final  . Chloride 06/30/2017 92* 101 - 111 mmol/L Final  . CO2 06/30/2017 29  22 - 32 mmol/L Final  . Glucose, Bld 06/30/2017 102* 65 - 99 mg/dL Final  . BUN 06/30/2017 20  6 - 20 mg/dL Final  . Creatinine, Ser 06/30/2017 1.17* 0.44 - 1.00 mg/dL Final  . Calcium 06/30/2017 8.5* 8.9 - 10.3 mg/dL Final  . Total Protein 06/30/2017 6.9  6.5 - 8.1 g/dL Final  . Albumin 06/30/2017 3.2* 3.5 - 5.0 g/dL Final  . AST 06/30/2017 23  15 - 41 U/L Final  . ALT 06/30/2017 13* 14 - 54 U/L Final  . Alkaline Phosphatase 06/30/2017 95  38 - 126 U/L Final  . Total Bilirubin 06/30/2017 1.0  0.3 - 1.2 mg/dL Final  . GFR calc non Af Amer 06/30/2017 45* >60 mL/min Final  . GFR calc Af Amer 06/30/2017 52* >60 mL/min Final   Comment: (NOTE) The eGFR has been calculated using the CKD EPI equation. This calculation has not been validated in all clinical situations. eGFR's persistently <60 mL/min signify possible Chronic Kidney Disease.   Georgiann Hahn gap 06/30/2017 9  5 - 15 Final   Performed at [facility], [address] [place], [place] [address]  . Troponin I 06/30/2017 0.09* <0.03 ng/mL Final   Comment: CRITICAL RESULT CALLED TO, READ BACK BY  AND VERIFIED WITH MEGAN JONES _0  06/30/17 MGP Performed at [facility], [address] [place], [place] [address]   . Color, Urine 06/30/2017 YELLOW* YELLOW Final  . APPearance 06/30/2017 HAZY* CLEAR Final  . Specific Gravity, Urine 06/30/2017 1.009  1.005 - 1.030 Final  . pH 06/30/2017 6.0  5.0 - 8.0 Final  . Glucose, UA 06/30/2017 NEGATIVE  NEGATIVE mg/dL Final  . Hgb urine dipstick 06/30/2017 SMALL* NEGATIVE Final  . Bilirubin Urine 06/30/2017 NEGATIVE  NEGATIVE Final  . Ketones, ur 06/30/2017 NEGATIVE  NEGATIVE mg/dL Final  . Protein, ur 06/30/2017 30* NEGATIVE mg/dL Final  . Nitrite 06/30/2017 POSITIVE*  NEGATIVE Final  . Leukocytes, UA 06/30/2017 LARGE* NEGATIVE Final  . RBC / HPF 06/30/2017 0-5  0 - 5 RBC/hpf Final  . WBC, UA 06/30/2017 21-50  0 - 5 WBC/hpf Final  . Bacteria, UA 06/30/2017 MANY* NONE SEEN Final  . Squamous Epithelial / LPF 06/30/2017 NONE SEEN  0 - 5 Final  . WBC Clumps 06/30/2017 PRESENT   Final   Performed at [facility], [address], [place], [place] [address]  . pH, Ven 06/30/2017 7.36  7.250 - 7.430 Final  . pCO2, Ven 06/30/2017 60  44.0 - 60.0 mmHg Final  . pO2, Ven 06/30/2017 <31.0* 32.0 - 45.0 mmHg Final   VENOUS  . Bicarbonate 06/30/2017 33.9* 20.0 - 28.0 mmol/L Final  . Acid-Base Excess 06/30/2017 6.8* 0.0 - 2.0 mmol/L Final  . Patient temperature 06/30/2017 37.0   Final  . Collection site 06/30/2017 VEIN   Final  . Sample type 06/30/2017 VENOUS   Final   Performed at [facility], [address] [place], [place] [address]  . Specimen Description 06/30/2017    Final                   Value:BLOOD LEFT ANTECUBITAL Performed at [facility], [address] [place], [place] [address]   . Special Requests 06/30/2017    Final                   Value:BOTTLES DRAWN AEROBIC AND ANAEROBIC Blood Culture results may not be optimal due to an excessive volume of blood received in culture bottles Performed at [facility], [address] [place], [place] [address]   . Culture  Setup Time 06/30/2017    Final                   Value:GRAM NEGATIVE RODS IN BOTH AEROBIC AND ANAEROBIC BOTTLES CRITICAL VALUE NOTED.  VALUE IS CONSISTENT WITH PREVIOUSLY REPORTED AND CALLED VALUE. Performed at [facility], [address], [place], [place] [address]   . Culture 06/30/2017 *  Final                   Value:ESCHERICHIA COLI SUSCEPTIBILITIES PERFORMED ON PREVIOUS CULTURE WITHIN THE LAST 5 DAYS. Performed at [facility], [place] [address] [place], [place] [address]   . Report Status 06/30/2017 07/03/2017 FINAL   Final   . Specimen Description 06/30/2017    Final                   Value:BLOOD RIGHT ANTECUBITAL Performed at [facility], [address] [place], [place] [address]   . Special Requests 06/30/2017    Final                   Value:BOTTLES DRAWN AEROBIC AND ANAEROBIC Blood Culture  adequate volume Performed at [facility], [address] [place], [place] [address]   . Culture  Setup Time 06/30/2017    Final                   Value:GRAM NEGATIVE RODS IN BOTH AEROBIC AND ANAEROBIC BOTTLES CRITICAL RESULT CALLED TO, READ BACK BY AND VERIFIED WITH: JASON ROBBINS _0  06/30/17 AKT   . Culture 06/30/2017 *  Final                   Value:ESCHERICHIA COLI Confirmed Extended Spectrum Beta-Lactamase Producer (ESBL).  In bloodstream infections from ESBL organisms, carbapenems are preferred over piperacillin/tazobactam. They are shown to have a lower risk of mortality. Performed at [facility], [place] [address] [place], [place] [address]   . Report Status 06/30/2017 07/03/2017 FINAL   Final  . Organism ID, Bacteria 06/30/2017 ESCHERICHIA COLI   Final  . Lactic Acid, Venous 06/30/2017 1.4  0.5 - 1.9 mmol/L Final   Performed at [facility], [address] [place], [place] [address]  . Specimen Description 06/30/2017    Final                   Value:URINE, CATHETERIZED Performed at [facility], [address] [place], [place] [address]   . Special Requests 06/30/2017    Final                   Value:Immunocompromised Performed at [facility], [address] [place], [place] [address]   . Culture 06/30/2017 *  Final                   Value:>=100,000 COLONIES/mL ESCHERICHIA COLI Confirmed Extended Spectrum Beta-Lactamase Producer (ESBL).  In bloodstream infections from ESBL organisms, carbapenems are preferred over piperacillin/tazobactam. They are shown to have a lower risk of mortality. Performed at [facility], [place] [address],  [place], [place] [address]   . Report Status 06/30/2017 07/02/2017 FINAL   Final  . Organism ID, Bacteria 06/30/2017 ESCHERICHIA COLI*  Final  . Sodium 07/01/2017 130* 135 - 145 mmol/L Final  . Potassium 07/01/2017 3.6  3.5 - 5.1 mmol/L Final  . Chloride 07/01/2017 93* 101 - 111 mmol/L Final  . CO2 07/01/2017 28  22 - 32 mmol/L Final  . Glucose, Bld 07/01/2017 88  65 - 99 mg/dL Final  . BUN 07/01/2017 20  6 - 20 mg/dL Final  . Creatinine, Ser 07/01/2017 1.20* 0.44 - 1.00 mg/dL Final  . Calcium 07/01/2017 8.2* 8.9 - 10.3 mg/dL Final  . GFR calc non Af Amer 07/01/2017 43* >60 mL/min Final  . GFR calc Af Amer 07/01/2017 50* >60 mL/min Final   Comment: (NOTE) The eGFR has been calculated using the CKD EPI equation. This calculation has not been validated in all clinical situations. eGFR's persistently <60 mL/min signify possible Chronic Kidney Disease.   Georgiann Hahn gap 07/01/2017 9  5 - 15 Final   Performed at [facility], [address] [place], [place] [address]  . Enterococcus species 06/30/2017 NOT DETECTED  NOT DETECTED Final  . Listeria monocytogenes 06/30/2017 NOT DETECTED  NOT DETECTED Final  . Staphylococcus species 06/30/2017 NOT DETECTED  NOT DETECTED Final  . Staphylococcus aureus 06/30/2017 NOT DETECTED  NOT DETECTED Final  . Streptococcus species 06/30/2017 NOT DETECTED  NOT DETECTED Final  . Streptococcus agalactiae 06/30/2017 NOT DETECTED  NOT DETECTED Final  . Streptococcus pneumoniae 06/30/2017  NOT DETECTED  NOT DETECTED Final  . Streptococcus pyogenes 06/30/2017 NOT DETECTED  NOT DETECTED Final  . Acinetobacter baumannii 06/30/2017 NOT DETECTED  NOT DETECTED Final  . Enterobacteriaceae species 06/30/2017 DETECTED* NOT DETECTED Final   Comment: Enterobacteriaceae represent a large family of gram-negative bacteria, not a single organism. CRITICAL RESULT CALLED TO, READ BACK BY AND VERIFIED WITH: JASON ROBBINS _0  06/30/17 AKT   . Enterobacter cloacae complex  06/30/2017 NOT DETECTED  NOT DETECTED Final  . Escherichia coli 06/30/2017 DETECTED* NOT DETECTED Final   Comment: CRITICAL RESULT CALLED TO, READ BACK BY AND VERIFIED WITH: JASON ROBBINS _1  06/30/17 AKT   . Klebsiella oxytoca 06/30/2017 NOT DETECTED  NOT DETECTED Final  . Klebsiella pneumoniae 06/30/2017 NOT DETECTED  NOT DETECTED Final  . Proteus species 06/30/2017 NOT DETECTED  NOT DETECTED Final  . Serratia marcescens 06/30/2017 NOT DETECTED  NOT DETECTED Final  . Carbapenem resistance 06/30/2017 NOT DETECTED  NOT DETECTED Final  . Haemophilus influenzae 06/30/2017 NOT DETECTED  NOT DETECTED Final  . Neisseria meningitidis 06/30/2017 NOT DETECTED  NOT DETECTED Final  . Pseudomonas aeruginosa 06/30/2017 NOT DETECTED  NOT DETECTED Final  . Candida albicans 06/30/2017 NOT DETECTED  NOT DETECTED Final  . Candida glabrata 06/30/2017 NOT DETECTED  NOT DETECTED Final  . Candida krusei 06/30/2017 NOT DETECTED  NOT DETECTED Final  . Candida parapsilosis 06/30/2017 NOT DETECTED  NOT DETECTED Final  . Candida tropicalis 06/30/2017 NOT DETECTED  NOT DETECTED Final   Performed at [facility], [address], [place], [place] [address]    Assessment:  [name] [name] is a 77 y.o. female with presumed metastatic squamous cell lung cancer.  She presented with clinical T2bNxM0 squamous cell lung cancer of the left lung s/p bronchoscopy and biopsy on 07/01/2016.  She has a 40 pack year smoking history.  She presented with left lower chest wall pain.  Foundation One testing on 03/13/2017 revealed no reportable alterations in EGFR, RET, ALK, MET, ERBB2, BRAF, and ROS1.    Tumor was MS-stable.  TMB was 3 Muts/Mb.  There was CDKN2A loss, CDKN2B loss, KRAS G12V, MTAP loss, and TP53 R282W.  PD-L1 was 5%.  Chest CT with contrast on 06/11/2016 revealed a 3.2 x 3.0 mass like area of focal opacity in the medial left lower lobe with obliteration of segmental airways to the anterior left lower  lobe.  PET scan on 06/27/2016 revealed a 4.3 x 3.2 cm central left lower lobe lung lesion (SUV 14.3) consistent with primary bronchogenic carcinoma.  There was equivocal nodal tissue in the subcarinal station demonstrating mild hypermetabolism (SUV 3.6). There was more peripheral left lower lobe increased atelectasis with mucoid impaction which is likely secondary to endobronchial obstruction.  There was a small left pleural effusion.  Head MRI on 07/07/2016 revealed no evidence of metastatic disease.  She received radiation from 07/22/2016 - 09/19/2016.  She received 5 weeks of concurrent carboplatin and Taxol (07/24/2016 - 08/11/2016; 09/05/2016 - 09/16/2016). She requires a reduced dose of Benadryl (25 mg) for her premedication.  Week #3 was postponed secondary to chest pain and evaluation.  She missed a couple of weeks.  Chest CT on 11/21/2016 revealed progressive collapse of the LEFT lower lobe presumably related to central obstructing mass.  Mass obstructed the LEFT lower lobe bronchus, although mass was not well demonstrated.  There were small effusions (no change).  There was centrilobular emphysema unchanged.  There was no evidence pneumonia.  PET scan on 01/27/2017 revealed interval response to  therapy.  The previously noted hypermetabolism associated with left lower lobe perihilar lung mass has resolved in the interval.  There was a moderate left pleural effusion that had increased in volume from previous PET-CT and there was now complete atelectasis/consolidation of the left lower lobe, which may obscure residual mass.  There was no new sites of hypermetabolism or evidence of distant metastatic disease.  Bronchoscopy on 02/09/2017 revealed no evidence of endobronchial tumor.  There was stenosis at the entry to the left lower posterior segments which could not be entered.  There was mildly erythematous mucosa with copious secretions in the left lower lobe.  Brushings were positive for  non-small cell carcinoma, favor squamous cell carcinoma.  Ultrasound guided thoracentesis on 02/17/2017 revealed was negative for malignancy.  CEA was 7.9 on 04/09/2017.  Bone scan on 02/27/2017 revealed a subtle focus of uptake in the left 7th rib, indeterminate.  Bone scan on 06/04/2017 revealed abnormal radiotracer uptake at T7 and T8 at the sites of recent kyphoplasty procedures. There was stable increased uptake in the mid lower lumbar regions, likely of arthropathic etiology. Elsewhere, the distribution of uptake is unremarkable.  She is  s/p cycle #3 carboplatin, Abraxane and pembrolizumab (03/19/2017 - 05/08/2017).  Cycle #2 was truncated secondary to pneumonia. Cycle #4 was postponed on 06/04/2017 due to severe pain.    Chest CT angiogram on 04/30/2017 revealed no pulmonary embolism.  There was a new wedge compression fracture of T7.  She has chronic atelectasis of the left lower lobe and left sided effusion.  Thoracic spine MRI on 05/15/2017 revealed severe benign-appearing subacute compression fracture of T7 without neural impingement.  There was acute new compression fracture of the superior endplate of T8.  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  She underwent T7 kyphoplasty on 05/22/2017.    Chest CT on 06/18/2017 demonstrated left lower lobe collapse/consolidation extending into the inferior left hilum (stable).  She has a normocytic anemia likely due to chemotherapy.  Work-up on 07/24/2016 revealed the following normal labs: ferritin (103), B12 (345), folate(19.7).  Iron saturation was 8% and TIBC was 258.  She has B12 deficiency.  B12 was 254 on 04/16/2017 and MMA 789 (high) on 04/23/2017.  She began B12 on 04/30/2016 (last 06/25/2017).  She has a history of stage IA left breast cancer in 02/2005.  She underwent lumpectomy.  Pathology revealed a T1cN0 lesion.  Tumor was ER + , PR +, and Her2/neu -.  Two  sentinel lymph nodes were negative.  She received chemotherapy (4 cycles of AC and possibly an abbreviated course of Taxol- no records available).  She received radiation.  She completed Femara in 08/2010.  She has fibromyalgia.  She has advanced thoracoabdominal aortic atherosclerosis with fusiform dilatation of the descending thoracic aorta up to 4.3 cm.  She is followed by Dr. Lucky Cowboy.  She has a history of recurrent UTIs.  UA on 03/19/2017 revealed  >100,000 CFU/mL of an ESBL producing E. coli.  She was treated with fosfomycin 3 g x 1 dose.  Urine culture on 05/15/2017 revealed ESBL producing E coli.  Sputum culture on 05/15/2017 revealed Corynebacterium striatum.  She was admitted to [facility] from 06/30/2017 - 97/98/9211 for complicated ESBL producing E.coli UTI and bacteremia. She received IV meropenem.  ID did not recommend port removal. She was discharged home on a 7 day course of intravenous Invanz.  She is allergic to contrast dye and requires premedications.  Symptomatically, she is fatigued.  She has ongoing back/rib pain.  She is on IV antibiotics. She is on oxygen 3 liters/min via [place]. Alkaline phosphatase is elevated.   Plan: 1. Labs today: CBC with differential, CMP, Mg 2. Review interval CT of chest - stable. Discuss contrast allergy. ALL contrasted imaging will require standard premedication at this point.  3. Review interval admission for UTI and bacteremia. BCx and UCx (+) for ESBL producing E.coli. IV ertapenem course to be completed on 07/10/2017. 4. Discuss follow-up consult with urology.  Cystoscopy planned for 07/22/2017. 5. Discuss follow-up consult with orthopedics.  Patient was scheduled for reevaluation and possible imaging on 06/09/2017, however patient did not go to the appointment.  Need to reschedule. Call placed to ortho. Patient called and made an appointment for today at 10 am - appointment missed. We called ortho clinic and they are amenable to working patient back in  now. Patient from [place] to [facility] ortho clinic to see Arvella Nigh, PA-C.  6. Discuss pain management.  Duragesic patch  dose increased to 50 mcg/hr on 06/12/2017. She continues Roxicodone 5 mg every 4 hours PRN.  Pain is better controlled, however remains significant. Needs to see orthopedics again and would potentially benefit from an intercostal nerve block with physiatry.  7. Continue monthly B12 injections (last 06/25/2017). 8. RTC in 1 week  for MD assessment, labs (CBC with diff, CMP, Mg).   Honor Loh, NP  07/09/2017, 5:35 PM  I saw and evaluated the patient, participating in the key portions of the service and reviewing pertinent diagnostic studies and records.  I reviewed the nurse practitioner's note and agree with the findings and the plan.  The assessment and plan were discussed with the patient.  Multiple questions were asked by the patient and answered.   Nolon Stalls, MD 07/09/2017, 5:35 PM

## 2017-07-06 ENCOUNTER — Other Ambulatory Visit: Payer: Self-pay | Admitting: *Deleted

## 2017-07-06 MED ORDER — FENTANYL 50 MCG/HR TD PT72: 50.0000 ug | MEDICATED_PATCH | TRANSDERMAL | 0 refills | Status: DC

## 2017-07-06 NOTE — Telephone Encounter (Signed)
Narcotic refill request:  [place] CSRS database reviewed prior to consideration of refills:  NARX scores --    Narcotic: 793  Sedative: 621  30 day average MME/day: 86.93  30 day average LME/day: 2.19  ORS score (range 0-999): 290  Providers outside of this practice prescribing narcotics/sedatives: YES  Prescriber Role in care Practice location Drug prescribed Disp # Disp date  Tracie Harrier PCP [place] 5/325mg  60 06/26/2017  [name] [name] PCP [facility]  Ambien 5mg  30 06/26/2017   Given a current oncological diagnosis, this patient has the potential to experience significant cancer related pain.  Benefits versus risks associated with continued therapy considered. Will continue pain management with opioids as previously prescribed. Patient educated that medications should not be bitten, chewed, or crushed. Additionally, safety precautions reviewed. Patient verbalized understanding that medications should not be sold or shared, taken with alcohol, or used while driving. She has been made aware of the side effects of using this medication. Patient understands that this medication can cause CNS depression, increase her risk of falls, and even lead to overdose that may result in death, if used outside of the parameters that she and I discussed. With all of this in mind, she accepts the risks and responsibilities associated with therapy and elects to continue to use the prescribed interventions.   Requesting: Duragesic patches - last filled 50 mcg (Disp # 5) on 06/12/2017/  Refill prescription sent in for: 1. Duragesic 50 mcg patch q72h (Disp #5)   Honor Loh, MSN, APRN, FNP-C, CEN Oncology/Hematology Nurse Practitioner  [facility] 07/06/17 1:46 PM

## 2017-07-07 DIAGNOSIS — R7881 Bacteremia: Secondary | ICD-10-CM | POA: Diagnosis not present

## 2017-07-07 DIAGNOSIS — B9620 Unspecified Escherichia coli [E. coli] as the cause of diseases classified elsewhere: Secondary | ICD-10-CM | POA: Diagnosis not present

## 2017-07-07 DIAGNOSIS — Z452 Encounter for adjustment and management of vascular access device: Secondary | ICD-10-CM | POA: Diagnosis not present

## 2017-07-07 DIAGNOSIS — J449 Chronic obstructive pulmonary disease, unspecified: Secondary | ICD-10-CM | POA: Diagnosis not present

## 2017-07-07 DIAGNOSIS — J9612 Chronic respiratory failure with hypercapnia: Secondary | ICD-10-CM | POA: Diagnosis not present

## 2017-07-07 DIAGNOSIS — I110 Hypertensive heart disease with heart failure: Secondary | ICD-10-CM | POA: Diagnosis not present

## 2017-07-07 DIAGNOSIS — J45909 Unspecified asthma, uncomplicated: Secondary | ICD-10-CM | POA: Diagnosis not present

## 2017-07-07 DIAGNOSIS — N390 Urinary tract infection, site not specified: Secondary | ICD-10-CM | POA: Diagnosis not present

## 2017-07-07 DIAGNOSIS — C3432 Malignant neoplasm of lower lobe, left bronchus or lung: Secondary | ICD-10-CM | POA: Diagnosis not present

## 2017-07-07 DIAGNOSIS — I5022 Chronic systolic (congestive) heart failure: Secondary | ICD-10-CM | POA: Diagnosis not present

## 2017-07-09 ENCOUNTER — Encounter: Payer: Self-pay | Admitting: Hematology and Oncology

## 2017-07-09 ENCOUNTER — Inpatient Hospital Stay: Payer: Medicare HMO

## 2017-07-09 ENCOUNTER — Inpatient Hospital Stay (HOSPITAL_BASED_OUTPATIENT_CLINIC_OR_DEPARTMENT_OTHER): Payer: Medicare HMO | Admitting: Hematology and Oncology

## 2017-07-09 ENCOUNTER — Other Ambulatory Visit: Payer: Self-pay | Admitting: Orthopedic Surgery

## 2017-07-09 ENCOUNTER — Other Ambulatory Visit: Payer: Self-pay

## 2017-07-09 VITALS — BP 110/72 | HR 89 | Temp 95.9°F | Resp 18 | Wt 108.3 lb

## 2017-07-09 DIAGNOSIS — E538 Deficiency of other specified B group vitamins: Secondary | ICD-10-CM

## 2017-07-09 DIAGNOSIS — I700 Atherosclerosis of aorta: Secondary | ICD-10-CM

## 2017-07-09 DIAGNOSIS — R0789 Other chest pain: Secondary | ICD-10-CM

## 2017-07-09 DIAGNOSIS — Z9221 Personal history of antineoplastic chemotherapy: Secondary | ICD-10-CM | POA: Diagnosis not present

## 2017-07-09 DIAGNOSIS — C3432 Malignant neoplasm of lower lobe, left bronchus or lung: Secondary | ICD-10-CM

## 2017-07-09 DIAGNOSIS — F17210 Nicotine dependence, cigarettes, uncomplicated: Secondary | ICD-10-CM

## 2017-07-09 DIAGNOSIS — Z87440 Personal history of urinary (tract) infections: Secondary | ICD-10-CM

## 2017-07-09 DIAGNOSIS — R5381 Other malaise: Secondary | ICD-10-CM | POA: Diagnosis not present

## 2017-07-09 DIAGNOSIS — Z9889 Other specified postprocedural states: Secondary | ICD-10-CM | POA: Diagnosis not present

## 2017-07-09 DIAGNOSIS — M549 Dorsalgia, unspecified: Secondary | ICD-10-CM

## 2017-07-09 DIAGNOSIS — N390 Urinary tract infection, site not specified: Secondary | ICD-10-CM | POA: Diagnosis not present

## 2017-07-09 DIAGNOSIS — I110 Hypertensive heart disease with heart failure: Secondary | ICD-10-CM

## 2017-07-09 DIAGNOSIS — M546 Pain in thoracic spine: Secondary | ICD-10-CM | POA: Diagnosis not present

## 2017-07-09 DIAGNOSIS — J9819 Other pulmonary collapse: Secondary | ICD-10-CM

## 2017-07-09 DIAGNOSIS — M069 Rheumatoid arthritis, unspecified: Secondary | ICD-10-CM

## 2017-07-09 DIAGNOSIS — I509 Heart failure, unspecified: Secondary | ICD-10-CM

## 2017-07-09 DIAGNOSIS — J449 Chronic obstructive pulmonary disease, unspecified: Secondary | ICD-10-CM

## 2017-07-09 DIAGNOSIS — R42 Dizziness and giddiness: Secondary | ICD-10-CM | POA: Diagnosis not present

## 2017-07-09 DIAGNOSIS — R05 Cough: Secondary | ICD-10-CM | POA: Diagnosis not present

## 2017-07-09 DIAGNOSIS — Z7189 Other specified counseling: Secondary | ICD-10-CM

## 2017-07-09 DIAGNOSIS — Z79899 Other long term (current) drug therapy: Secondary | ICD-10-CM

## 2017-07-09 DIAGNOSIS — R0781 Pleurodynia: Secondary | ICD-10-CM

## 2017-07-09 DIAGNOSIS — Z1612 Extended spectrum beta lactamase (ESBL) resistance: Secondary | ICD-10-CM

## 2017-07-09 DIAGNOSIS — B9629 Other Escherichia coli [E. coli] as the cause of diseases classified elsewhere: Secondary | ICD-10-CM

## 2017-07-09 DIAGNOSIS — R5383 Other fatigue: Secondary | ICD-10-CM | POA: Diagnosis not present

## 2017-07-09 DIAGNOSIS — S22060A Wedge compression fracture of T7-T8 vertebra, initial encounter for closed fracture: Secondary | ICD-10-CM

## 2017-07-09 DIAGNOSIS — M797 Fibromyalgia: Secondary | ICD-10-CM

## 2017-07-09 DIAGNOSIS — Z9981 Dependence on supplemental oxygen: Secondary | ICD-10-CM

## 2017-07-09 LAB — COMPREHENSIVE METABOLIC PANEL
ALT: 13 U/L (ref 0–44)
AST: 23 U/L (ref 15–41)
Albumin: 3.4 g/dL — ABNORMAL LOW (ref 3.5–5.0)
Alkaline Phosphatase: 149 U/L — ABNORMAL HIGH (ref 38–126)
Anion gap: 9 (ref 5–15)
BUN: 13 mg/dL (ref 8–23)
CO2: 27 mmol/L (ref 22–32)
Calcium: 8.9 mg/dL (ref 8.9–10.3)
Chloride: 97 mmol/L — ABNORMAL LOW (ref 98–111)
Creatinine, Ser: 1.19 mg/dL — ABNORMAL HIGH (ref 0.44–1.00)
GFR calc Af Amer: 51 mL/min — ABNORMAL LOW (ref >60)
GFR calc non Af Amer: 44 mL/min — ABNORMAL LOW (ref >60)
Glucose, Bld: 101 mg/dL — ABNORMAL HIGH (ref 70–99)
Potassium: 4.2 mmol/L (ref 3.5–5.1)
Sodium: 133 mmol/L — ABNORMAL LOW (ref 135–145)
Total Bilirubin: 0.5 mg/dL (ref 0.3–1.2)
Total Protein: 7.2 g/dL (ref 6.5–8.1)

## 2017-07-09 LAB — CBC WITH DIFFERENTIAL/PLATELET
Basophils Absolute: 0.1 10*3/uL (ref 0–0.1)
Basophils Relative: 2 %
Eosinophils Absolute: 0.4 10*3/uL (ref 0–0.7)
Eosinophils Relative: 6 %
HCT: 33.6 % — ABNORMAL LOW (ref 35.0–47.0)
Hemoglobin: 11.2 g/dL — ABNORMAL LOW (ref 12.0–16.0)
Lymphocytes Relative: 17 %
Lymphs Abs: 1.1 10*3/uL (ref 1.0–3.6)
MCH: 31.6 pg (ref 26.0–34.0)
MCHC: 33.5 g/dL (ref 32.0–36.0)
MCV: 94.2 fL (ref 80.0–100.0)
Monocytes Absolute: 0.5 10*3/uL (ref 0.2–0.9)
Monocytes Relative: 8 %
Neutro Abs: 4.5 10*3/uL (ref 1.4–6.5)
Neutrophils Relative %: 67 %
Platelets: 211 10*3/uL (ref 150–440)
RBC: 3.56 MIL/uL — ABNORMAL LOW (ref 3.80–5.20)
RDW: 16.1 % — ABNORMAL HIGH (ref 11.5–14.5)
WBC: 6.7 10*3/uL (ref 3.6–11.0)

## 2017-07-09 LAB — MAGNESIUM: Magnesium: 2.1 mg/dL (ref 1.7–2.4)

## 2017-07-09 NOTE — Progress Notes (Signed)
Patient c/o back pain today 8/10.  On 3 L O2. O2 saturation 92%.

## 2017-07-10 NOTE — Progress Notes (Deleted)
* [facility]   Synopsis: Patient was diagnosed with squamous cell lung cancer on 07/01/16 by bronchoscopy.  She isT2bNxM0 sqamous cell lung cancer  status post chemotherapy radiation with persistence seen on bronch on 02/09/17   Assessment and Plan:  Left lower lobe atelectasis secondary to lung cancer. - Currently the patient appears asymptomatic, her LLL atelectasis appears unchanged from previous CT in November 2018.  --Continue chemo per Oncology  COPD, with chronic respiratory failure, dyspnea on exertion. - Continue Spiriva, added rescue inhaler.  --Asked to use mucinex-D, and daily azithromycin 250 mg daily.   Excessive daytime sleepiness.  --May be due to sleep apnea vs. Klonopin/ambien combo.  --Cut down on one of the above meds.   Nicotine Abuse.  --this is also likely contributing to her dyspnea.  --Discussed the importance of smoking cessation, spent 3 min in discussion.   No orders of the defined types were placed in this encounter.  No follow-ups on file.  Date: 07/10/2017  MRN# [id number] [name] [name] Jun 11, 1940   [name] [name] is a 77 y.o. old female seen in follow up for chief complaint of  No chief complaint on file.   Subjective: The patient is a 77 year old female with a history of stage IIb squamous cell lung cancer, she underwent re-bronch on 02/09/17 which showed residual cancer in the LLL.    She wears oxygen at 3L at night, she does not wear any during the day.  At last visit she was asked to continue Spiriva Respimat, Mucinex D, daily azithromycin. She is sleepy during the day, she has been tested for OSA which was negative several years ago.   **CT chest 06/18/2017>> comparison with previous on 04/30/2017, there is similar left lower lobe atelectasis with pleural effusion/pleural thickening.  Emphysematous changes remain. **Bronch 07/01/16>> positive for SqCa.  **Re-Bronch 02/09/17>> positive for SqCa. **PET scan 01/27/17>> on  comparison with CT chest 11/21/16 shows minimally changed LLL atelectasis with small pleural effusion in the left.   Desat walk 12/01/16; Baseline sat on RA at rest; sat 94% and HR 92;  After walking 360 feet she felt mild dyspnea, sat was 92% and HR 97.   CT chest 11/21/16, and comparison with previous on 06/11/16, there is complete left lower lobe atelectasis secondary to obstruction of the left lower lobe bronchus.  On previous CT chest and on previous bronchoscopy this was partially obstructed, this now appears to be completely obstructed.    Medication:    Current Outpatient Medications:  .  albuterol (PROVENTIL HFA;VENTOLIN HFA) 108 (90 Base) MCG/ACT inhaler, Inhale 1-2 puffs into the lungs every 6 (six) hours as needed for wheezing or shortness of breath., Disp: 1 Inhaler, Rfl: 2 .  Calcium-Vitamin D 600-200 MG-UNIT tablet, Take 1 tablet by mouth daily. , Disp: , Rfl:  .  carvedilol (COREG) 12.5 MG tablet, Take 1 tablet (12.5 mg total) by mouth 2 (two) times daily with a meal., Disp: 60 tablet, Rfl: 0 .  cholecalciferol (VITAMIN D) 1000 units tablet, Take 1,000 Units by mouth daily., Disp: , Rfl:  .  clonazePAM (KLONOPIN) 1 MG tablet, Take 1 mg by mouth 2 (two) times daily as needed for anxiety. , Disp: , Rfl:  .  ertapenem (INVANZ) 1 g injection, , Disp: , Rfl:  .  ertapenem (INVANZ) IVPB, Inject 1 g into the vein daily for 8 days. Indication:  E.Coli Bacteremia  Last Day of Therapy:  07/10/17 Labs - Once weekly:  CBC/D and BMP,  SrCr  Please fax lab results to: [phone], Disp: 8 Units, Rfl: 0 .  fentaNYL (DURAGESIC - DOSED MCG/HR) 50 MCG/HR, Place 1 patch (50 mcg total) onto the skin every 3 (three) days., Disp: 5 patch, Rfl: 0 .  gabapentin (NEURONTIN) 600 MG tablet, Take 600 mg by mouth 3 (three) times daily. , Disp: , Rfl:  .  HYDROcodone-acetaminophen (NORCO/VICODIN) 5-325 MG tablet, Take 1 tablet by mouth 2 (two) times daily as needed for pain., Disp: , Rfl:  .  imipramine (TOFRANIL)  25 MG tablet, Take 25 mg by mouth at bedtime. , Disp: , Rfl:  .  lidocaine-prilocaine (EMLA) cream, Apply 1 application topically as needed., Disp: 30 g, Rfl: 3 .  LORazepam (ATIVAN) 0.5 MG tablet, Take 1 tablet (0.5 mg total) by mouth every 6 (six) hours as needed (Nausea or vomiting)., Disp: 30 tablet, Rfl: 0 .  mirabegron ER (MYRBETRIQ) 25 MG TB24 tablet, Take 1 tablet (25 mg total) by mouth daily., Disp: 30 tablet, Rfl: 0 .  Multiple Vitamin (MULTIVITAMIN) capsule, Take 1 capsule by mouth daily., Disp: , Rfl:  .  nitroGLYCERIN (NITROSTAT) 0.4 MG SL tablet, Place 0.4 mg under the tongue every 5 (five) minutes as needed for chest pain. , Disp: , Rfl:  .  ondansetron (ZOFRAN) 8 MG tablet, Take 1 tablet (8 mg total) by mouth 2 (two) times daily as needed. Start on the third day after chemotherapy., Disp: 30 tablet, Rfl: 1 .  oxyCODONE (OXY IR/ROXICODONE) 5 MG immediate release tablet, Take 1 tablet (5 mg total) by mouth every 6 (six) hours as needed for severe pain., Disp: 30 tablet, Rfl: 0 .  Tiotropium Bromide Monohydrate (SPIRIVA RESPIMAT) 2.5 MCG/ACT AERS, Inhale 2.5 mcg into the lungs 2 (two) times daily., Disp: 1 Inhaler, Rfl: 0 .  traMADol (ULTRAM) 50 MG tablet, Take 2 tablets (100 mg total) by mouth daily., Disp: 30 tablet, Rfl: 0 .  vitamin C (ASCORBIC ACID) 500 MG tablet, Take 500 mg by mouth daily., Disp: , Rfl:  .  ZINC SULFATE PO, Take 1 tablet by mouth daily. , Disp: , Rfl:  .  zolpidem (AMBIEN) 5 MG tablet, Take 5 mg by mouth at bedtime., Disp: , Rfl:  No current facility-administered medications for this visit.   Facility-Administered Medications Ordered in Other Visits:  .  cyanocobalamin ((VITAMIN B-12)) injection 1,000 mcg, 1,000 mcg, Intramuscular, Once, Honor Loh E, NP .  heparin lock flush 100 unit/mL, 500 Units, Intravenous, Once, [name], Melissa C, MD  Allergies:  Contrast media [iodinated diagnostic agents]; Sulfa antibiotics; Tetracycline; White petrolatum;  Amoxicillin-pot clavulanate; and Tape  Review of Systems: Gen:  Denies  fever, sweats. HEENT: Denies blurred vision. Cvc:  No dizziness, chest pain or heaviness Resp:   Denies cough or sputum porduction. Gi: Denies swallowing difficulty, stomach pain. constipation, bowel incontinence Gu:  Denies bladder incontinence, burning urine Ext:   No Joint pain, stiffness. Skin: No skin rash, easy bruising. Endoc:  No polyuria, polydipsia. Psych: No depression, insomnia. Other:  All other systems were reviewed and found to be negative other than what is mentioned in the HPI.   Physical Examination:   VS: There were no vitals taken for this visit.  General Appearance: No distress  Neuro:without focal findings,  speech normal,  HEENT: PERRLA, EOM intact. Pulmonary: Continued Decreased air entry left base. CardiovascularNormal S1,S2.  No m/r/g.   Abdomen: Benign, Soft, non-tender. Renal:  No costovertebral tenderness  GU:  Not performed at this time. Endoc: No evident  thyromegaly, no signs of acromegaly. Skin:   warm, no rash. Extremities: normal, no cyanosis, clubbing.   LABORATORY PANEL:   CBC Recent Labs  Lab 07/09/17 0847  WBC 6.7  HGB 11.2*  HCT 33.6*  PLT 211   ------------------------------------------------------------------------------------------------------------------  Chemistries  Recent Labs  Lab 07/09/17 0847  NA 133*  K 4.2  CL 97*  CO2 27  GLUCOSE 101*  BUN 13  CREATININE 1.19*  CALCIUM 8.9  MG 2.1  AST 23  ALT 13  ALKPHOS 149*  BILITOT 0.5   ------------------------------------------------------------------------------------------------------------------  Cardiac Enzymes No results for input(s): TROPONINI in the last 168 hours. ------------------------------------------------------------  RADIOLOGY:   No results found for this or any previous visit. Results for orders placed during the hospital encounter of 10/28/16  DG Chest 2 View    Narrative CLINICAL DATA:  Off and chest congestion for the past 2 weeks with fever. History of COPD -asthma, current smoker, CHF.  EXAM: CHEST  2 VIEW  COMPARISON:  Chest x-ray of August 07, 2016  FINDINGS: The right lung is mildly hyperinflated. There is a tiny right pleural effusion. On the left there is a small pleural effusion. There is left basilar atelectasis or infiltrate. The heart and pulmonary vascularity are normal. There is calcification in the wall of the aortic arch. There is mild multilevel degenerative disc disease of the thoracic spine.  IMPRESSION: Left lower lobe atelectasis or pneumonia with small left pleural effusion. Tiny right pleural effusion. Underlying COPD. No overt CHF. Followup PA and lateral chest X-ray is recommended in 3-4 weeks following trial of antibiotic therapy to ensure resolution and exclude underlying malignancy.  Thoracic aortic atherosclerosis.   Electronically Signed   By: David  Martinique M.D.   On: 10/28/2016 12:18    ------------------------------------------------------------------------------------------------------------------  Thank  you for allowing [facility] to assist in the care of your patient. Our recommendations are noted above.  Please contact us if we can be of further service.   [name], MD.  [facility] Office Number: [phone]  Patricia Pesa, M.D.  Merton Border, M.D  07/10/2017

## 2017-07-11 DIAGNOSIS — J45909 Unspecified asthma, uncomplicated: Secondary | ICD-10-CM | POA: Diagnosis not present

## 2017-07-11 DIAGNOSIS — J449 Chronic obstructive pulmonary disease, unspecified: Secondary | ICD-10-CM | POA: Diagnosis not present

## 2017-07-11 DIAGNOSIS — Z452 Encounter for adjustment and management of vascular access device: Secondary | ICD-10-CM | POA: Diagnosis not present

## 2017-07-11 DIAGNOSIS — J9612 Chronic respiratory failure with hypercapnia: Secondary | ICD-10-CM | POA: Diagnosis not present

## 2017-07-11 DIAGNOSIS — I110 Hypertensive heart disease with heart failure: Secondary | ICD-10-CM | POA: Diagnosis not present

## 2017-07-11 DIAGNOSIS — N390 Urinary tract infection, site not specified: Secondary | ICD-10-CM | POA: Diagnosis not present

## 2017-07-11 DIAGNOSIS — B9620 Unspecified Escherichia coli [E. coli] as the cause of diseases classified elsewhere: Secondary | ICD-10-CM | POA: Diagnosis not present

## 2017-07-11 DIAGNOSIS — C3432 Malignant neoplasm of lower lobe, left bronchus or lung: Secondary | ICD-10-CM | POA: Diagnosis not present

## 2017-07-11 DIAGNOSIS — I5022 Chronic systolic (congestive) heart failure: Secondary | ICD-10-CM | POA: Diagnosis not present

## 2017-07-13 ENCOUNTER — Ambulatory Visit: Payer: Medicare HMO | Admitting: Internal Medicine

## 2017-07-14 DIAGNOSIS — J45909 Unspecified asthma, uncomplicated: Secondary | ICD-10-CM | POA: Diagnosis not present

## 2017-07-14 DIAGNOSIS — N390 Urinary tract infection, site not specified: Secondary | ICD-10-CM | POA: Diagnosis not present

## 2017-07-14 DIAGNOSIS — J449 Chronic obstructive pulmonary disease, unspecified: Secondary | ICD-10-CM | POA: Diagnosis not present

## 2017-07-14 DIAGNOSIS — Z452 Encounter for adjustment and management of vascular access device: Secondary | ICD-10-CM | POA: Diagnosis not present

## 2017-07-14 DIAGNOSIS — I5022 Chronic systolic (congestive) heart failure: Secondary | ICD-10-CM | POA: Diagnosis not present

## 2017-07-14 DIAGNOSIS — C3432 Malignant neoplasm of lower lobe, left bronchus or lung: Secondary | ICD-10-CM | POA: Diagnosis not present

## 2017-07-14 DIAGNOSIS — J9612 Chronic respiratory failure with hypercapnia: Secondary | ICD-10-CM | POA: Diagnosis not present

## 2017-07-14 DIAGNOSIS — B9620 Unspecified Escherichia coli [E. coli] as the cause of diseases classified elsewhere: Secondary | ICD-10-CM | POA: Diagnosis not present

## 2017-07-14 DIAGNOSIS — I110 Hypertensive heart disease with heart failure: Secondary | ICD-10-CM | POA: Diagnosis not present

## 2017-07-16 DIAGNOSIS — J45909 Unspecified asthma, uncomplicated: Secondary | ICD-10-CM | POA: Diagnosis not present

## 2017-07-16 DIAGNOSIS — J9612 Chronic respiratory failure with hypercapnia: Secondary | ICD-10-CM | POA: Diagnosis not present

## 2017-07-16 DIAGNOSIS — Z452 Encounter for adjustment and management of vascular access device: Secondary | ICD-10-CM | POA: Diagnosis not present

## 2017-07-16 DIAGNOSIS — C3432 Malignant neoplasm of lower lobe, left bronchus or lung: Secondary | ICD-10-CM | POA: Diagnosis not present

## 2017-07-16 DIAGNOSIS — N390 Urinary tract infection, site not specified: Secondary | ICD-10-CM | POA: Diagnosis not present

## 2017-07-16 DIAGNOSIS — I110 Hypertensive heart disease with heart failure: Secondary | ICD-10-CM | POA: Diagnosis not present

## 2017-07-16 DIAGNOSIS — J449 Chronic obstructive pulmonary disease, unspecified: Secondary | ICD-10-CM | POA: Diagnosis not present

## 2017-07-16 DIAGNOSIS — I5022 Chronic systolic (congestive) heart failure: Secondary | ICD-10-CM | POA: Diagnosis not present

## 2017-07-16 DIAGNOSIS — B9620 Unspecified Escherichia coli [E. coli] as the cause of diseases classified elsewhere: Secondary | ICD-10-CM | POA: Diagnosis not present

## 2017-07-17 ENCOUNTER — Inpatient Hospital Stay: Payer: Medicare HMO | Admitting: Hematology and Oncology

## 2017-07-17 ENCOUNTER — Emergency Department: Payer: Medicare HMO

## 2017-07-17 ENCOUNTER — Inpatient Hospital Stay: Payer: Medicare HMO

## 2017-07-17 ENCOUNTER — Inpatient Hospital Stay
Admission: EM | Admit: 2017-07-17 | Discharge: 2017-07-20 | DRG: 871 | Disposition: A | Discharge status: Home Health | Payer: Medicare HMO | Attending: Internal Medicine | Admitting: Internal Medicine

## 2017-07-17 ENCOUNTER — Other Ambulatory Visit: Payer: Self-pay

## 2017-07-17 ENCOUNTER — Encounter: Payer: Self-pay | Admitting: Emergency Medicine

## 2017-07-17 DIAGNOSIS — A419 Sepsis, unspecified organism: Secondary | ICD-10-CM | POA: Diagnosis not present

## 2017-07-17 DIAGNOSIS — J9621 Acute and chronic respiratory failure with hypoxia: Secondary | ICD-10-CM | POA: Diagnosis present

## 2017-07-17 DIAGNOSIS — Z880 Allergy status to penicillin: Secondary | ICD-10-CM

## 2017-07-17 DIAGNOSIS — J9600 Acute respiratory failure, unspecified whether with hypoxia or hypercapnia: Secondary | ICD-10-CM | POA: Diagnosis present

## 2017-07-17 DIAGNOSIS — J189 Pneumonia, unspecified organism: Secondary | ICD-10-CM | POA: Diagnosis not present

## 2017-07-17 DIAGNOSIS — Z882 Allergy status to sulfonamides status: Secondary | ICD-10-CM | POA: Diagnosis not present

## 2017-07-17 DIAGNOSIS — I110 Hypertensive heart disease with heart failure: Secondary | ICD-10-CM | POA: Diagnosis present

## 2017-07-17 DIAGNOSIS — Z8249 Family history of ischemic heart disease and other diseases of the circulatory system: Secondary | ICD-10-CM

## 2017-07-17 DIAGNOSIS — Z9981 Dependence on supplemental oxygen: Secondary | ICD-10-CM

## 2017-07-17 DIAGNOSIS — R339 Retention of urine, unspecified: Secondary | ICD-10-CM | POA: Diagnosis not present

## 2017-07-17 DIAGNOSIS — Z87440 Personal history of urinary (tract) infections: Secondary | ICD-10-CM | POA: Diagnosis not present

## 2017-07-17 DIAGNOSIS — Z853 Personal history of malignant neoplasm of breast: Secondary | ICD-10-CM | POA: Diagnosis not present

## 2017-07-17 DIAGNOSIS — J9601 Acute respiratory failure with hypoxia: Secondary | ICD-10-CM

## 2017-07-17 DIAGNOSIS — M797 Fibromyalgia: Secondary | ICD-10-CM | POA: Diagnosis present

## 2017-07-17 DIAGNOSIS — E440 Moderate protein-calorie malnutrition: Secondary | ICD-10-CM | POA: Diagnosis not present

## 2017-07-17 DIAGNOSIS — I6782 Cerebral ischemia: Secondary | ICD-10-CM | POA: Diagnosis not present

## 2017-07-17 DIAGNOSIS — M069 Rheumatoid arthritis, unspecified: Secondary | ICD-10-CM | POA: Diagnosis present

## 2017-07-17 DIAGNOSIS — Z888 Allergy status to other drugs, medicaments and biological substances status: Secondary | ICD-10-CM | POA: Diagnosis not present

## 2017-07-17 DIAGNOSIS — G9341 Metabolic encephalopathy: Secondary | ICD-10-CM | POA: Diagnosis present

## 2017-07-17 DIAGNOSIS — C3432 Malignant neoplasm of lower lobe, left bronchus or lung: Secondary | ICD-10-CM | POA: Diagnosis present

## 2017-07-17 DIAGNOSIS — Z91041 Radiographic dye allergy status: Secondary | ICD-10-CM

## 2017-07-17 DIAGNOSIS — F17210 Nicotine dependence, cigarettes, uncomplicated: Secondary | ICD-10-CM | POA: Diagnosis present

## 2017-07-17 DIAGNOSIS — Z90710 Acquired absence of both cervix and uterus: Secondary | ICD-10-CM

## 2017-07-17 DIAGNOSIS — J90 Pleural effusion, not elsewhere classified: Secondary | ICD-10-CM | POA: Diagnosis not present

## 2017-07-17 DIAGNOSIS — R296 Repeated falls: Secondary | ICD-10-CM | POA: Diagnosis present

## 2017-07-17 DIAGNOSIS — R4182 Altered mental status, unspecified: Secondary | ICD-10-CM | POA: Diagnosis not present

## 2017-07-17 DIAGNOSIS — Z6822 Body mass index (BMI) 22.0-22.9, adult: Secondary | ICD-10-CM

## 2017-07-17 DIAGNOSIS — J440 Chronic obstructive pulmonary disease with acute lower respiratory infection: Secondary | ICD-10-CM | POA: Diagnosis present

## 2017-07-17 DIAGNOSIS — J9611 Chronic respiratory failure with hypoxia: Secondary | ICD-10-CM

## 2017-07-17 DIAGNOSIS — I5022 Chronic systolic (congestive) heart failure: Secondary | ICD-10-CM | POA: Diagnosis present

## 2017-07-17 DIAGNOSIS — Z881 Allergy status to other antibiotic agents status: Secondary | ICD-10-CM

## 2017-07-17 DIAGNOSIS — Y95 Nosocomial condition: Secondary | ICD-10-CM | POA: Diagnosis present

## 2017-07-17 DIAGNOSIS — Z91048 Other nonmedicinal substance allergy status: Secondary | ICD-10-CM

## 2017-07-17 DIAGNOSIS — R0600 Dyspnea, unspecified: Secondary | ICD-10-CM | POA: Diagnosis not present

## 2017-07-17 LAB — COMPREHENSIVE METABOLIC PANEL
ALT: 10 U/L (ref 0–44)
ANION GAP: 8 (ref 5–15)
AST: 19 U/L (ref 15–41)
Albumin: 2.9 g/dL — ABNORMAL LOW (ref 3.5–5.0)
Alkaline Phosphatase: 137 U/L — ABNORMAL HIGH (ref 38–126)
BILIRUBIN TOTAL: 0.9 mg/dL (ref 0.3–1.2)
BUN: 12 mg/dL (ref 8–23)
CO2: 29 mmol/L (ref 22–32)
CREATININE: 1.21 mg/dL — AB (ref 0.44–1.00)
Calcium: 8.7 mg/dL — ABNORMAL LOW (ref 8.9–10.3)
Chloride: 97 mmol/L — ABNORMAL LOW (ref 98–111)
GFR, EST AFRICAN AMERICAN: 50 mL/min — AB (ref >60)
GFR, EST NON AFRICAN AMERICAN: 43 mL/min — AB (ref >60)
Glucose, Bld: 100 mg/dL — ABNORMAL HIGH (ref 70–99)
Potassium: 4.5 mmol/L (ref 3.5–5.1)
Sodium: 134 mmol/L — ABNORMAL LOW (ref 135–145)
TOTAL PROTEIN: 6.9 g/dL (ref 6.5–8.1)

## 2017-07-17 LAB — CBC WITH DIFFERENTIAL/PLATELET
BASOS ABS: 0.1 10*3/uL (ref 0–0.1)
Basophils Relative: 1 %
EOS ABS: 0.2 10*3/uL (ref 0–0.7)
EOS PCT: 2 %
HCT: 34.5 % — ABNORMAL LOW (ref 35.0–47.0)
Hemoglobin: 11.5 g/dL — ABNORMAL LOW (ref 12.0–16.0)
Lymphocytes Relative: 7 %
Lymphs Abs: 0.6 10*3/uL — ABNORMAL LOW (ref 1.0–3.6)
MCH: 31.3 pg (ref 26.0–34.0)
MCHC: 33.4 g/dL (ref 32.0–36.0)
MCV: 93.8 fL (ref 80.0–100.0)
MONO ABS: 0.7 10*3/uL (ref 0.2–0.9)
Monocytes Relative: 8 %
Neutro Abs: 7.1 10*3/uL — ABNORMAL HIGH (ref 1.4–6.5)
Neutrophils Relative %: 82 %
PLATELETS: 225 10*3/uL (ref 150–440)
RBC: 3.68 MIL/uL — AB (ref 3.80–5.20)
RDW: 15.6 % — AB (ref 11.5–14.5)
WBC: 8.6 10*3/uL (ref 3.6–11.0)

## 2017-07-17 LAB — PROTIME-INR
INR: 0.99
PROTHROMBIN TIME: 13 s (ref 11.4–15.2)

## 2017-07-17 LAB — LACTIC ACID, PLASMA
LACTIC ACID, VENOUS: 0.6 mmol/L (ref 0.5–1.9)
Lactic Acid, Venous: 0.6 mmol/L (ref 0.5–1.9)

## 2017-07-17 LAB — PROCALCITONIN: Procalcitonin: 0.52 ng/mL

## 2017-07-17 LAB — MRSA PCR SCREENING: MRSA BY PCR: NEGATIVE

## 2017-07-17 MED ORDER — VANCOMYCIN HCL IN DEXTROSE 1-5 GM/200ML-% IV SOLN
1000.0000 mg | Freq: Once | INTRAVENOUS | Status: AC
  Administered 2017-07-17: 1000 mg via INTRAVENOUS
  Filled 2017-07-17: qty 200

## 2017-07-17 MED ORDER — SODIUM CHLORIDE 0.9 % IV SOLN
INTRAVENOUS | Status: DC
  Administered 2017-07-17 – 2017-07-20 (×3): via INTRAVENOUS

## 2017-07-17 MED ORDER — SODIUM CHLORIDE 0.9 % IV BOLUS (SEPSIS)
500.0000 mL | Freq: Once | INTRAVENOUS | Status: AC
  Administered 2017-07-17: 500 mL via INTRAVENOUS

## 2017-07-17 MED ORDER — SODIUM CHLORIDE 0.9 % IV SOLN
2.0000 g | Freq: Two times a day (BID) | INTRAVENOUS | Status: DC
  Administered 2017-07-18 – 2017-07-20 (×5): 2 g via INTRAVENOUS
  Filled 2017-07-17 (×7): qty 2

## 2017-07-17 MED ORDER — VANCOMYCIN HCL 500 MG IV SOLR
500.0000 mg | INTRAVENOUS | Status: DC
  Administered 2017-07-17 – 2017-07-19 (×3): 500 mg via INTRAVENOUS
  Filled 2017-07-17 (×4): qty 500

## 2017-07-17 MED ORDER — IPRATROPIUM-ALBUTEROL 0.5-2.5 (3) MG/3ML IN SOLN
3.0000 mL | RESPIRATORY_TRACT | Status: DC
  Administered 2017-07-17 – 2017-07-19 (×8): 3 mL via RESPIRATORY_TRACT
  Filled 2017-07-17 (×8): qty 3

## 2017-07-17 MED ORDER — HEPARIN SODIUM (PORCINE) 5000 UNIT/ML IJ SOLN
5000.0000 [IU] | Freq: Three times a day (TID) | INTRAMUSCULAR | Status: DC
  Administered 2017-07-17 – 2017-07-20 (×8): 5000 [IU] via SUBCUTANEOUS
  Filled 2017-07-17 (×8): qty 1

## 2017-07-17 MED ORDER — METHYLPREDNISOLONE SODIUM SUCC 125 MG IJ SOLR
60.0000 mg | INTRAMUSCULAR | Status: DC
  Administered 2017-07-17 – 2017-07-19 (×3): 60 mg via INTRAVENOUS
  Filled 2017-07-17 (×3): qty 2

## 2017-07-17 MED ORDER — HYDROCODONE-ACETAMINOPHEN 5-325 MG PO TABS
1.0000 | ORAL_TABLET | Freq: Two times a day (BID) | ORAL | Status: DC | PRN
  Administered 2017-07-18 – 2017-07-19 (×3): 1 via ORAL
  Filled 2017-07-17 (×3): qty 1

## 2017-07-17 MED ORDER — SODIUM CHLORIDE 0.9 % IV BOLUS (SEPSIS)
1000.0000 mL | Freq: Once | INTRAVENOUS | Status: AC
Start: 2017-07-17 — End: 2017-07-17
  Administered 2017-07-17: 1000 mL via INTRAVENOUS

## 2017-07-17 MED ORDER — SODIUM CHLORIDE 0.9 % IV SOLN
2.0000 g | Freq: Once | INTRAVENOUS | Status: AC
  Administered 2017-07-17: 2 g via INTRAVENOUS
  Filled 2017-07-17: qty 2

## 2017-07-17 MED ORDER — GABAPENTIN 600 MG PO TABS
600.0000 mg | ORAL_TABLET | Freq: Three times a day (TID) | ORAL | Status: DC
  Administered 2017-07-17 – 2017-07-20 (×8): 600 mg via ORAL
  Filled 2017-07-17 (×8): qty 1

## 2017-07-17 MED ORDER — FENTANYL 50 MCG/HR TD PT72
50.0000 ug | MEDICATED_PATCH | TRANSDERMAL | Status: DC
  Administered 2017-07-18: 50 ug via TRANSDERMAL
  Filled 2017-07-17: qty 1

## 2017-07-17 MED ORDER — ALBUTEROL SULFATE (2.5 MG/3ML) 0.083% IN NEBU
5.0000 mg | INHALATION_SOLUTION | Freq: Once | RESPIRATORY_TRACT | Status: AC
  Administered 2017-07-17: 5 mg via RESPIRATORY_TRACT
  Filled 2017-07-17: qty 6

## 2017-07-17 MED ORDER — DOCUSATE SODIUM 100 MG PO CAPS
100.0000 mg | ORAL_CAPSULE | Freq: Two times a day (BID) | ORAL | Status: DC | PRN
Start: 2017-07-17 — End: 2017-07-20

## 2017-07-17 NOTE — Progress Notes (Signed)
CODE SEPSIS - PHARMACY COMMUNICATION  **Broad Spectrum Antibiotics should be administered within 1 hour of Sepsis diagnosis**  Time Code Sepsis Called/Page Received: 1243  Antibiotics Ordered: vancomycin and cefepime  Time of 1st antibiotic administration: 1345  Additional action taken by pharmacy: spoke to RN. RN hung vancomycin because cefepime was grayed out in [employer]. Sending cefepime from main pharmacy  If necessary, Name of Provider/Nurse Contacted: Ty Hilts ,PharmD Clinical Pharmacist  07/17/2017  12:47 PM

## 2017-07-17 NOTE — Progress Notes (Deleted)
[facility] day:  07/17/2017     Chief Complaint: [name] [name] is a 76 y.o. 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  HPI: The patient was last seen in the medical oncology clinic on 07/09/2017.  At that time, she was fatigued.  She had ongoing back/rib pain.  She was on IV antibiotics. She was on oxygen 3 liters/min via [place]. Alkaline phosphatase was elevated.  Chest CT was stable.  She was scheduled to be seen by orthopedics to assess her back and rib pain.  She is scheduled for cystoscopy on 07/22/2017.  She was seen by Emmit Alexanders, PA on 07/09/2017.  She was felt to have a compression fracture causing severe pain.  Plain films revealed concern for mild inferior endplate compression deformity along T5, T6.  She was scheduled for thoracic spine MRI (07/28/2017).  During the interim,   Past Medical History:  Diagnosis Date  . Asthma   . Breast cancer ([place]) 2009   left  . Cancer ([place])   . CHF (congestive heart failure) ([place])   . CHF (congestive heart failure) ([place])   . Chronic UTI   . COPD (chronic obstructive pulmonary disease) ([place])   . Dizziness   . Fibromyalgia   . Hypertension   . Neuropathy   . Personal history of tobacco use, presenting hazards to health 05/17/2015  . Polyp, larynx   . RA (rheumatoid arthritis) ([place])   . Sinus infection    recent  . Stumbling gait    to the left  . Supplemental oxygen dependent    2.5l    Past Surgical History:  Procedure Laterality Date  . ABDOMINAL HYSTERECTOMY    . BREAST LUMPECTOMY Left 2009   chemo and radiation  . CYST EXCISION Left 02/27/2015   Procedure: CYST REMOVAL;  Surgeon: Hessie Knows, MD;  Location: [facility] OR[facility];  Service: Orthopedics;  Laterality: Left;  . EYE MUSCLE SURGERY Right    13 surgeries  . FLEXIBLE BRONCHOSCOPY N/A 07/01/2016   Procedure: FLEXIBLE BRONCHOSCOPY;  Surgeon: Wilhelmina Mcardle,  MD;  Location: [facility] OR[facility];  Service: Pulmonary;  Laterality: N/A;  . FLEXIBLE BRONCHOSCOPY N/A 02/09/2017   Procedure: FLEXIBLE BRONCHOSCOPY;  Surgeon: [name], MD;  Location: [facility] OR[facility];  Service: Pulmonary;  Laterality: N/A;  . KYPHOPLASTY N/A 05/22/2017   Procedure: Celine Ahr;  Surgeon: Hessie Knows, MD;  Location: [facility] OR[facility];  Service: Orthopedics;  Laterality: N/A;  . PORTA CATH INSERTION N/A 03/04/2017   Procedure: PORTA CATH INSERTION;  Surgeon: [name], MD;  Location: [facility];  Service: Cardiovascular;  Laterality: N/A;  . THUMB ARTHROSCOPY Left     Family History  Problem Relation Age of Onset  . Diabetes Father   . Stroke Father   . Heart attack Father   . CAD Sister     Social History:  reports that she has been smoking cigarettes.  She has a 40.00 pack-year smoking history. She has never used smokeless tobacco. She reports that she does not drink alcohol or use drugs.  He lives with her son and grandson. Her grandson is in the waiting room.  The patient is alone today.   Allergies:  Allergies  Allergen Reactions  . Contrast Media [Iodinated Diagnostic Agents] Shortness Of Breath    Acute onset of shortness of breath following CT contrast administration 06/18/17. Sent to the ED. Dr. Kerman Passey documented  incident as an allergic reaction. Needs premedications prior to future contrast media injections. Previous questionable contrast allergy, so the patient was given entire or partial premedications without complications. She was not given any premedications on 06/18/17, resulting in shortness of breath and an ED visit.  . Sulfa Antibiotics Other (See Comments)    Unknown  . Tetracycline Hives  . White Petrolatum Other (See Comments)    Blisters  . Amoxicillin-Pot Clavulanate Rash and Other (See Comments)    Blisters in mouth Has patient had a PCN reaction causing immediate rash, facial/tongue/throat swelling, SOB or lightheadedness with  hypotension: No Has patient had a PCN reaction causing severe rash involving mucus membranes or skin necrosis: No Has patient had a PCN reaction that required hospitalization: No Has patient had a PCN reaction occurring within the last 10 years: No If all of the above answers are "NO", then may proceed with Cephalosporin use.   . Tape Rash    Current Medications: Current Outpatient Medications  Medication Sig Dispense Refill  . albuterol (PROVENTIL HFA;VENTOLIN HFA) 108 (90 Base) MCG/ACT inhaler Inhale 1-2 puffs into the lungs every 6 (six) hours as needed for wheezing or shortness of breath. 1 Inhaler 2  . Calcium-Vitamin D 600-200 MG-UNIT tablet Take 1 tablet by mouth daily.     . carvedilol (COREG) 12.5 MG tablet Take 1 tablet (12.5 mg total) by mouth 2 (two) times daily with a meal. 60 tablet 0  . cholecalciferol (VITAMIN D) 1000 units tablet Take 1,000 Units by mouth daily.    . clonazePAM (KLONOPIN) 1 MG tablet Take 1 mg by mouth 2 (two) times daily as needed for anxiety.     . ertapenem (INVANZ) 1 g injection     . fentaNYL (DURAGESIC - DOSED MCG/HR) 50 MCG/HR Place 1 patch (50 mcg total) onto the skin every 3 (three) days. 5 patch 0  . gabapentin (NEURONTIN) 600 MG tablet Take 600 mg by mouth 3 (three) times daily.     [name] HYDROcodone-acetaminophen (NORCO/VICODIN) 5-325 MG tablet Take 1 tablet by mouth 2 (two) times daily as needed for pain.    [name] imipramine (TOFRANIL) 25 MG tablet Take 25 mg by mouth at bedtime.     . lidocaine-prilocaine (EMLA) cream Apply 1 application topically as needed. 30 g 3  . LORazepam (ATIVAN) 0.5 MG tablet Take 1 tablet (0.5 mg total) by mouth every 6 (six) hours as needed (Nausea or vomiting). 30 tablet 0  . mirabegron ER (MYRBETRIQ) 25 MG TB24 tablet Take 1 tablet (25 mg total) by mouth daily. 30 tablet 0  . Multiple Vitamin (MULTIVITAMIN) capsule Take 1 capsule by mouth daily.    . nitroGLYCERIN (NITROSTAT) 0.4 MG SL tablet Place 0.4 mg under the tongue  every 5 (five) minutes as needed for chest pain.     [name] ondansetron (ZOFRAN) 8 MG tablet Take 1 tablet (8 mg total) by mouth 2 (two) times daily as needed. Start on the third day after chemotherapy. 30 tablet 1  . oxyCODONE (OXY IR/ROXICODONE) 5 MG immediate release tablet Take 1 tablet (5 mg total) by mouth every 6 (six) hours as needed for severe pain. 30 tablet 0  . Tiotropium Bromide Monohydrate (SPIRIVA RESPIMAT) 2.5 MCG/ACT AERS Inhale 2.5 mcg into the lungs 2 (two) times daily. 1 Inhaler 0  . traMADol (ULTRAM) 50 MG tablet Take 2 tablets (100 mg total) by mouth daily. 30 tablet 0  . vitamin C (ASCORBIC ACID) 500 MG tablet Take 500 mg by mouth  daily.    [name] ZINC SULFATE PO Take 1 tablet by mouth daily.     [name] zolpidem (AMBIEN) 5 MG tablet Take 5 mg by mouth at bedtime.     No current facility-administered medications for this visit.    Facility-Administered Medications Ordered in Other Visits  Medication Dose Route Frequency Provider Last Rate Last Dose  . cyanocobalamin ((VITAMIN B-12)) injection 1,000 mcg  1,000 mcg Intramuscular Once [name] Loh E, NP      . heparin lock flush 100 unit/mL  500 Units Intravenous Once Lequita Asal, MD        Review of Systems  Constitutional: Positive for malaise/fatigue and weight loss (down 3 pounds). Negative for diaphoresis and fever.       Tired of being tired.  HENT: Negative.  Negative for congestion, ear pain, nosebleeds, sinus pain and sore throat.   Eyes: Negative.  Negative for blurred vision, double vision, photophobia, pain, discharge and redness.  Respiratory: Positive for cough (chronic) and shortness of breath (exertional related to COPD). Negative for hemoptysis and sputum production.        On supplemental oxygen (3L/[place]) ATC  Cardiovascular: Positive for orthopnea. Negative for chest pain, palpitations, leg swelling and PND.  Gastrointestinal: Negative for abdominal pain, blood in stool, constipation, diarrhea, melena, nausea and  vomiting.  Genitourinary: Negative for dysuria, frequency, hematuria and urgency.       Recurrent URI; ESBL producing [name].coli on IV antibiotics.  Musculoskeletal: Positive for back pain (chronic; kyphoplasty 05/22/2017). Negative for falls, joint pain and myalgias.       BACK pain worse.  BILATERAL ribs. Status post wedge resection.   Skin: Negative for itching and rash.  Neurological: Positive for weakness (generalized). Negative for dizziness, tremors and headaches.  Endo/Heme/Allergies: Does not bruise/bleed easily.  Psychiatric/Behavioral: Negative for depression, memory loss and suicidal ideas. The patient is not nervous/anxious and does not have insomnia.   All other systems reviewed and are negative.  Performance status (ECOG): 2 - Symptomatic, <50% confined to bed  Physical Exam: There were no vitals taken for this visit.  GENERAL:  Chronically fatigued appearing thin woman sitting comfortably in a wheelchair in the exam room in no acute distress. MENTAL STATUS:  Alert and oriented to person, place and time. HEAD:  Wearing a turquoise cap.  Alopecia totalis.  Normocephalic, atraumatic, face symmetric, no Cushingoid features. EYES:  Blue eyes.  Pupils equal round and reactive to light and accomodation.  No conjunctivitis or scleral icterus. ENT:  Hornick in place.  Oropharynx clear without lesion.  Tongue normal. Mucous membranes moist.  RESPIRATORY:  Clear to auscultation without rales, wheezes or rhonchi. CARDIOVASCULAR:  Regular rate and rhythm without murmur, rub or gallop. ABDOMEN:  Soft, non-tender, with active bowel sounds, and no hepatosplenomegaly.  No masses. BACK:  Tender to palpation mid thoracic spine (no change). SKIN:  No rashes, ulcers or lesions. EXTREMITIES: No edema, no skin discoloration or tenderness.  No palpable cords. LYMPH NODES: No palpable cervical, supraclavicular, axillary or inguinal adenopathy  NEUROLOGICAL: Unremarkable. PSYCH:  Appropriate.    No  visits with results within 3 Day(s) from this visit.  Latest known visit with results is:  Orders Only on 07/09/2017  Component Date Value Ref Range Status  . Magnesium 07/09/2017 2.1  1.7 - 2.4 mg/dL Final   Performed at [facility], [address] [place], [place] [address]  . Sodium 07/09/2017 133* 135 - 145 mmol/L Final  . Potassium 07/09/2017 4.2  3.5 -  5.1 mmol/L Final  . Chloride 07/09/2017 97* 98 - 111 mmol/L Final   Please note change in reference range.  . CO2 07/09/2017 27  22 - 32 mmol/L Final  . Glucose, Bld 07/09/2017 101* 70 - 99 mg/dL Final   Please note change in reference range.  . BUN 07/09/2017 13  8 - 23 mg/dL Final   Please note change in reference range.  . Creatinine, Ser 07/09/2017 1.19* 0.44 - 1.00 mg/dL Final  . Calcium 07/09/2017 8.9  8.9 - 10.3 mg/dL Final  . Total Protein 07/09/2017 7.2  6.5 - 8.1 g/dL Final  . Albumin 07/09/2017 3.4* 3.5 - 5.0 g/dL Final  . AST 07/09/2017 23  15 - 41 U/L Final  . ALT 07/09/2017 13  0 - 44 U/L Final   Please note change in reference range.  . Alkaline Phosphatase 07/09/2017 149* 38 - 126 U/L Final  . Total Bilirubin 07/09/2017 0.5  0.3 - 1.2 mg/dL Final  . GFR calc non Af Amer 07/09/2017 44* >60 mL/min Final  . GFR calc Af Amer 07/09/2017 51* >60 mL/min Final   Comment: (NOTE) The eGFR has been calculated using the CKD EPI equation. This calculation has not been validated in all clinical situations. eGFR's persistently <60 mL/min signify possible Chronic Kidney Disease.   [name] gap 07/09/2017 9  5 - 15 Final   Performed at [facility], [address] [place], [place] [address]  . WBC 07/09/2017 6.7  3.6 - 11.0 K/uL Final  . RBC 07/09/2017 3.56* 3.80 - 5.20 MIL/uL Final  . Hemoglobin 07/09/2017 11.2* 12.0 - 16.0 g/dL Final  . HCT 07/09/2017 33.6* 35.0 - 47.0 % Final  . MCV 07/09/2017 94.2  80.0 - 100.0 fL Final  . MCH 07/09/2017 31.6  26.0 - 34.0 pg Final  . MCHC 07/09/2017 33.5  32.0 - 36.0  g/dL Final  . RDW 07/09/2017 16.1* 11.5 - 14.5 % Final  . Platelets 07/09/2017 211  150 - 440 K/uL Final  . Neutrophils Relative % 07/09/2017 67  % Final  . Neutro Abs 07/09/2017 4.5  1.4 - 6.5 K/uL Final  . Lymphocytes Relative 07/09/2017 17  % Final  . Lymphs Abs 07/09/2017 1.1  1.0 - 3.6 K/uL Final  . Monocytes Relative 07/09/2017 8  % Final  . Monocytes Absolute 07/09/2017 0.5  0.2 - 0.9 K/uL Final  . Eosinophils Relative 07/09/2017 6  % Final  . Eosinophils Absolute 07/09/2017 0.4  0 - 0.7 K/uL Final  . Basophils Relative 07/09/2017 2  % Final  . Basophils Absolute 07/09/2017 0.1  0 - 0.1 K/uL Final   Performed at [facility], [address] [place], [place] [address]    Assessment:  NINI CAVAN is a 77 y.o. female with presumed metastatic squamous cell lung cancer.  She presented with clinical [id number] squamous cell lung cancer of the left lung s/p bronchoscopy and biopsy on 07/01/2016.  She has a 40 pack year smoking history.  She presented with left lower chest wall pain.  Foundation One testing on 03/13/2017 revealed no reportable alterations in EGFR, RET, ALK, MET, ERBB2, BRAF, and ROS1.    Tumor was MS-stable.  TMB was 3 Muts/Mb.  There was CDKN2A loss, CDKN2B loss, KRAS G12V, MTAP loss, and TP53 R282W.  PD-L1 was 5%.  Chest CT with contrast on 06/11/2016 revealed a 3.2 x 3.0 mass like area of focal opacity in the medial left lower lobe with obliteration of segmental airways  to the anterior left lower lobe.  PET scan on 06/27/2016 revealed a 4.3 x 3.2 cm central left lower lobe lung lesion (SUV 14.3) consistent with primary bronchogenic carcinoma.  There was equivocal nodal tissue in the subcarinal station demonstrating mild hypermetabolism (SUV 3.6). There was more peripheral left lower lobe increased atelectasis with mucoid impaction which is likely secondary to endobronchial obstruction.  There was a small left pleural effusion.  Head MRI on 07/07/2016 revealed no  evidence of metastatic disease.  She received radiation from 07/22/2016 - 09/19/2016.  She received 5 weeks of concurrent carboplatin and Taxol (07/24/2016 - 08/11/2016; 09/05/2016 - 09/16/2016). She requires a reduced dose of Benadryl (25 mg) for her premedication.  Week #3 was postponed secondary to chest pain and evaluation.  She missed a couple of weeks.  Chest CT on 11/21/2016 revealed progressive collapse of the LEFT lower lobe presumably related to central obstructing mass.  Mass obstructed the LEFT lower lobe bronchus, although mass was not well demonstrated.  There were small effusions (no change).  There was centrilobular emphysema unchanged.  There was no evidence pneumonia.  PET scan on 01/27/2017 revealed interval response to therapy.  The previously noted hypermetabolism associated with left lower lobe perihilar lung mass has resolved in the interval.  There was a moderate left pleural effusion that had increased in volume from previous PET-CT and there was now complete atelectasis/consolidation of the left lower lobe, which may obscure residual mass.  There was no new sites of hypermetabolism or evidence of distant metastatic disease.  Bronchoscopy on 02/09/2017 revealed no evidence of endobronchial tumor.  There was stenosis at the entry to the left lower posterior segments which could not be entered.  There was mildly erythematous mucosa with copious secretions in the left lower lobe.  Brushings were positive for non-small cell carcinoma, favor squamous cell carcinoma.  Ultrasound guided thoracentesis on 02/17/2017 revealed was negative for malignancy.  CEA was 7.9 on 04/09/2017.  Bone scan on 02/27/2017 revealed a subtle focus of uptake in the left 7th rib, indeterminate.  Bone scan on 06/04/2017 revealed abnormal radiotracer uptake at T7 and T8 at the sites of recent kyphoplasty procedures. There was stable increased uptake in the mid lower lumbar regions, likely of arthropathic  etiology. Elsewhere, the distribution of uptake is unremarkable.  She is  s/p cycle #3 carboplatin, Abraxane and pembrolizumab (03/19/2017 - 05/08/2017).  Cycle #2 was truncated secondary to pneumonia. Cycle #4 was postponed on 06/04/2017 due to severe pain.    Chest CT angiogram on 04/30/2017 revealed no pulmonary embolism.  There was a new wedge compression fracture of T7.  She has chronic atelectasis of the left lower lobe and left sided effusion.  Thoracic spine MRI on 05/15/2017 revealed severe benign-appearing subacute compression fracture of T7 without neural impingement.  There was acute new compression fracture of the superior endplate of T8.  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  She underwent T7 kyphoplasty on 05/22/2017.    Chest CT on 06/18/2017 demonstrated left lower lobe collapse/consolidation extending into the inferior left hilum (stable).  She has a normocytic anemia likely due to chemotherapy.  Work-up on 07/24/2016 revealed the following normal labs: ferritin (103), B12 (345), folate(19.7).  Iron saturation was 8% and TIBC was 258.  She has B12 deficiency.  B12 was 254 on 04/16/2017 and MMA 789 (high) on 04/23/2017.  She began B12 on 04/30/2016 (last 06/25/2017).  She has  a history of stage IA left breast cancer in 02/2005.  She underwent lumpectomy.  Pathology revealed a T1cN0 lesion.  Tumor was ER + , PR +, and Her2/neu -.  Two sentinel lymph nodes were negative.  She received chemotherapy (4 cycles of AC and possibly an abbreviated course of Taxol- no records available).  She received radiation.  She completed Femara in 08/2010.  She has fibromyalgia.  She has advanced thoracoabdominal aortic atherosclerosis with fusiform dilatation of the descending thoracic aorta up to 4.3 cm.  She is followed by Dr. Lucky Cowboy.  She has a history of recurrent UTIs.  UA on 03/19/2017 revealed  >100,000 CFU/mL of an  ESBL producing [name]. coli.  She was treated with fosfomycin 3 g x 1 dose.  Urine culture on 05/15/2017 revealed ESBL producing [name] coli.  Sputum culture on 05/15/2017 revealed Corynebacterium striatum.  She was admitted to [name] from 06/30/2017 - 41/28/7867 for complicated ESBL producing [name].coli UTI and bacteremia. She received IV meropenem.  ID did not recommend port removal. She was discharged home on a 7 day course of intravenous Invanz.  She is allergic to contrast dye and requires premedications.  Symptomatically, she is fatigued.  She has ongoing back/rib pain.  She is on IV antibiotics. She is on oxygen 3 liters/min via [place]. Alkaline phosphatase is elevated.   Plan: 1. Labs today: CBC with differential, CMP, Mg. 2. Discuss orthopedics consult and plan for thoracic spine MRI. 3. Discuss plan for cystoscopy on 07/22/2017. 4. Next B12 due 07/23/2017.   5. Review interval CT of chest - stable. Discuss contrast allergy. ALL contrasted imaging will require standard premedication at this point.  6. Review interval admission for UTI and bacteremia. BCx and UCx (+) for ESBL producing [name].coli. IV ertapenem course to be completed on 07/10/2017. 7. Discuss follow-up consult with urology.  Cystoscopy planned for 07/22/2017. 8. Discuss follow-up consult with orthopedics.  Patient was scheduled for reevaluation and possible imaging on 06/09/2017, however patient did not go to the appointment.  Need to reschedule. Call placed to ortho. Patient called and made an appointment for today at 10 am - appointment missed. We called ortho clinic and they are amenable to working patient back in now. Patient from [place] to [facility] ortho clinic to see Arvella Nigh, PA-C.  9. Discuss pain management.  Duragesic patch  dose increased to 50 mcg/hr on 06/12/2017. She continues Roxicodone 5 mg every 4 hours PRN.  Pain is better controlled, however remains significant. Needs to see orthopedics again and would potentially benefit from an  intercostal nerve block with physiatry.  10. Continue monthly B12 injections (last 06/25/2017). 11. RTC in 1 week  for MD assessment, labs (CBC with diff, CMP, Mg).   Lequita Asal, MD  07/09/2017, 5:35 PM  I saw and evaluated the patient, participating in the key portions of the service and reviewing pertinent diagnostic studies and records.  I reviewed the nurse practitioner's note and agree with the findings and the plan.  The assessment and plan were discussed with the patient.  Multiple questions were asked by the patient and answered.   Nolon Stalls, MD 07/09/2017, 5:35 PM

## 2017-07-17 NOTE — ED Provider Notes (Signed)
[facility] Emergency Department Provider Note  ____________________________________________  Time seen: Approximately 1:55 PM  I have reviewed the triage vital signs and the nursing notes.   HISTORY  Chief Complaint Shortness of Breath  Level 5 Caveat: Portions of the History and Physical including HPI and review of systems are unable to be completely obtained due to patient being a poor historian   HPI [name] [name] is a 77 y.o. female with a history of breast cancer and lung cancer on chemotherapy, rheumatoid arthritis, CHF, COPD who comes to the ED today due to worsening shortness of breath and generalized weakness and fatigue.  EMS note that she had a fever as well.   Patient reports chronic shortness of breath that is worse since yesterday, denies chest pain, no other acute complaints.  No aggravating or alleviating factors.  On 3 L oxygen at all times.     Past Medical History:  Diagnosis Date  . Asthma   . Breast cancer ([place]) 2009   left  . Cancer ([place])   . CHF (congestive heart failure) ([place])   . CHF (congestive heart failure) ([place])   . Chronic UTI   . COPD (chronic obstructive pulmonary disease) ([place])   . Dizziness   . Fibromyalgia   . Hypertension   . Neuropathy   . Personal history of tobacco use, presenting hazards to health 05/17/2015  . Polyp, larynx   . RA (rheumatoid arthritis) ([place])   . Sinus infection    recent  . Stumbling gait    to the left  . Supplemental oxygen dependent    2.5l     Patient Active Problem List   Diagnosis Date Noted  . Encephalopathy acute 06/30/2017  . UTI due to extended-spectrum beta lactamase (ESBL) producing Escherichia coli 05/21/2017  . Acute midline thoracic back pain 05/09/2017  . Compression fracture of T7 vertebra (HCC) 05/08/2017  . Rib pain on right side 05/08/2017  . Encounter for antineoplastic immunotherapy 05/08/2017  . B12 deficiency 04/29/2017  . Anemia 04/11/2017  . History of  recurrent UTIs 04/11/2017  . Diarrhea 03/28/2017  . Rib lesion 03/03/2017  . Cancer ([place]) 02/16/2017  . Goals of care, counseling/discussion 02/02/2017  . Collapse of left lung 02/02/2017  . Pleural effusion, left 02/02/2017  . Pneumonia of left lower lobe due to infectious organism ([place]) 11/04/2016  . Recurrent UTI 09/08/2016  . Lung mass 09/08/2016  . Encounter for antineoplastic chemotherapy 07/24/2016  . Primary cancer of left lower lobe of lung ([place]) 06/25/2016  . Mixed hyperlipidemia 06/19/2016  . Peripheral neuropathy 06/19/2016  . Aneurysm of thoracic aorta ([place]) 04/29/2016  . Pain in limb 04/29/2016  . Acute delirium 03/06/2016  . Acute renal insufficiency 03/06/2016  . Pressure injury of skin 03/05/2016  . UTI (urinary tract infection) 03/04/2016  . Acute renal failure (ARF) ([place]) 12/04/2015  . Malnutrition of moderate degree 11/24/2015  . Hyponatremia   . COPD (chronic obstructive pulmonary disease) ([place]) 11/15/2015  . Chronic systolic CHF (congestive heart failure) ([place]) 11/15/2015  . Descending thoracic aortic aneurysm ([place]) 09/11/2015  . Personal history of tobacco use, presenting hazards to health 05/17/2015  . COPD, moderate ([place]) 05/19/2014  . Abnormal cardiovascular stress test 03/10/2014  . CAD (coronary artery disease) 02/25/2014  . Aneurysm of abdominal vessel ([place]) 02/24/2014  . Shortness of breath 02/24/2014  . Vocal cord polyp 02/13/2014  . Narcotic drug use 01/17/2013  . Fibromyalgia 05/11/2012  . Low back pain 05/11/2012  . Arthritis  11/03/2011  . Asthma 11/03/2011  . Breast cancer ([place]) 11/03/2011  . Osteoarthritis 06/17/2011     Past Surgical History:  Procedure Laterality Date  . ABDOMINAL HYSTERECTOMY    . BREAST LUMPECTOMY Left 2009   chemo and radiation  . CYST EXCISION Left 02/27/2015   Procedure: CYST REMOVAL;  Surgeon: Hessie Knows, MD;  Location: [facility] ORS;  Service: Orthopedics;  Laterality: Left;  . EYE MUSCLE SURGERY Right    13  surgeries  . FLEXIBLE BRONCHOSCOPY N/A 07/01/2016   Procedure: FLEXIBLE BRONCHOSCOPY;  Surgeon: Wilhelmina Mcardle, MD;  Location: [facility] ORS;  Service: Pulmonary;  Laterality: N/A;  . FLEXIBLE BRONCHOSCOPY N/A 02/09/2017   Procedure: FLEXIBLE BRONCHOSCOPY;  Surgeon: [name], MD;  Location: [facility] ORS;  Service: Pulmonary;  Laterality: N/A;  . KYPHOPLASTY N/A 05/22/2017   Procedure: Celine Ahr;  Surgeon: Hessie Knows, MD;  Location: [facility] ORS;  Service: Orthopedics;  Laterality: N/A;  . PORTA CATH INSERTION N/A 03/04/2017   Procedure: PORTA CATH INSERTION;  Surgeon: [name], MD;  Location: [facility];  Service: Cardiovascular;  Laterality: N/A;  . THUMB ARTHROSCOPY Left      Prior to Admission medications   Medication Sig Start Date End Date Taking? Authorizing Provider  albuterol (PROVENTIL HFA;VENTOLIN HFA) 108 (90 Base) MCG/ACT inhaler Inhale 1-2 puffs into the lungs every 6 (six) hours as needed for wheezing or shortness of breath. 02/16/17   [name], MD  Calcium-Vitamin D 600-200 MG-UNIT tablet Take 1 tablet by mouth daily.     [provider]  carvedilol (COREG) 12.5 MG tablet Take 1 tablet (12.5 mg total) by mouth 2 (two) times daily with a meal. 11/29/15   Hugelmeyer, Alexis, DO  cholecalciferol (VITAMIN D) 1000 units tablet Take 1,000 Units by mouth daily.    [provider]  clonazePAM (KLONOPIN) 1 MG tablet Take 1 mg by mouth 2 (two) times daily as needed for anxiety.     [provider]  ertapenem Colbert Ewing) 1 g injection  07/02/17   [provider]  fentaNYL (DURAGESIC - DOSED MCG/HR) 50 MCG/HR Place 1 patch (50 mcg total) onto the skin every 3 (three) days. 07/06/17   Karen Kitchens, NP  gabapentin (NEURONTIN) 600 MG tablet Take 600 mg by mouth 3 (three) times daily.  05/25/12   [provider]  HYDROcodone-acetaminophen (NORCO/VICODIN) 5-325 MG tablet Take 1 tablet by mouth 2 (two) times daily as  needed for pain. 06/26/17   [provider]  imipramine (TOFRANIL) 25 MG tablet Take 25 mg by mouth at bedtime.  09/21/15   [provider]  lidocaine-prilocaine (EMLA) cream Apply 1 application topically as needed. 03/19/17   Karen Kitchens, NP  LORazepam (ATIVAN) 0.5 MG tablet Take 1 tablet (0.5 mg total) by mouth every 6 (six) hours as needed (Nausea or vomiting). 03/19/17   Lequita Asal, MD  mirabegron ER (MYRBETRIQ) 25 MG TB24 tablet Take 1 tablet (25 mg total) by mouth daily. 06/11/17   Zara Council A, PA-C  Multiple Vitamin (MULTIVITAMIN) capsule Take 1 capsule by mouth daily.    [provider]  nitroGLYCERIN (NITROSTAT) 0.4 MG SL tablet Place 0.4 mg under the tongue every 5 (five) minutes as needed for chest pain.     [provider]  ondansetron (ZOFRAN) 8 MG tablet Take 1 tablet (8 mg total) by mouth 2 (two) times daily as needed. Start on the third day after chemotherapy. 03/19/17   Lequita Asal, MD  oxyCODONE (OXY IR/ROXICODONE) 5 MG immediate release tablet Take 1 tablet (5 mg total) by mouth every 6 (six) hours as needed for severe pain. 06/12/17   Karen Kitchens, NP  Tiotropium Bromide Monohydrate (SPIRIVA RESPIMAT) 2.5 MCG/ACT AERS Inhale 2.5 mcg into the lungs 2 (two) times daily. 06/26/16   Flora Lipps, MD  traMADol (ULTRAM) 50 MG tablet Take 2 tablets (100 mg total) by mouth daily. 06/25/17   Jacquelin Hawking, NP  vitamin C (ASCORBIC ACID) 500 MG tablet Take 500 mg by mouth daily.    [provider]  ZINC SULFATE PO Take 1 tablet by mouth daily.     [provider]  zolpidem (AMBIEN) 5 MG tablet Take 5 mg by mouth at bedtime.    [provider]     Allergies Contrast media [iodinated diagnostic agents]; Sulfa antibiotics; Tetracycline; White petrolatum; Amoxicillin-pot clavulanate; and Tape   Family History  Problem Relation Age of Onset  . Diabetes Father   . Stroke Father   . Heart attack Father   .  CAD Sister     Social History Social History   Tobacco Use  . Smoking status: Current Every Day Smoker    Packs/day: 1.00    Years: 40.00    Pack years: 40.00    Types: Cigarettes  . Smokeless tobacco: Never Used  Substance Use Topics  . Alcohol use: No  . Drug use: No    Review of Systems  Constitutional:   No fever or chills noted at home.  ENT:   No sore throat. No rhinorrhea. Cardiovascular:   No chest pain or syncope. Respiratory:   Positive shortness of breath, positive cough. Gastrointestinal:   Negative for abdominal pain, vomiting and diarrhea.  Musculoskeletal:   Negative for focal pain or swelling All other systems reviewed and are negative except as documented above in ROS and HPI.  ____________________________________________   PHYSICAL EXAM:  VITAL SIGNS: ED Triage Vitals  Enc Vitals Group     BP 07/17/17 1309 (!) 135/43     Pulse Rate 07/17/17 1230 (!) 125     Resp 07/17/17 1230 (!) 25     Temp 07/17/17 1230 (!) 100.5 F (38.1 C)     Temp Source 07/17/17 1230 Oral     SpO2 07/17/17 1230 100 %     Weight 07/17/17 1228 108 lb (49 kg)     Height 07/17/17 1309 _0  (1.549 m)     Head Circumference --      Peak Flow --      Pain Score 07/17/17 1309 0     Pain Loc --      Pain Edu? --      Excl. in [place]? --     Vital signs reviewed, nursing assessments reviewed.   Constitutional:   Alert and oriented.  Ill-appearing. Eyes:   Conjunctivae are normal. EOMI. PERRL. ENT      Head:   Normocephalic and atraumatic.      Nose:   No congestion/rhinnorhea.       Mouth/Throat:   Dry mucous membranes, no pharyngeal erythema. No peritonsillar mass.       Neck:   No meningismus. Full ROM. Hematological/Lymphatic/Immunilogical:   No cervical lymphadenopathy. Cardiovascular:   Tachycardia heart rate 120. Symmetric bilateral radial and DP pulses.  No murmurs.  Respiratory:   Tachypnea, increased work of breathing.  Diffuse expiratory wheezing, prolonged  expiratory phase.  Crackles at left base. Gastrointestinal:   Soft and nontender.  Non distended. There is no CVA tenderness.  No rebound, rigidity, or guarding.  Musculoskeletal:   Normal range of motion in all extremities. No joint effusions.  No lower extremity tenderness.  No edema. Neurologic:   Normal speech and language.  Motor grossly intact. No acute focal neurologic deficits are appreciated.  Skin:    Skin is warm, dry and intact. No rash noted.  No petechiae, purpura, or bullae.  ____________________________________________    LABS (pertinent positives/negatives) (all labs ordered are listed, but only abnormal results are displayed) Labs Reviewed  COMPREHENSIVE METABOLIC PANEL - Abnormal; Notable for the following components:      Result Value   Sodium 134 (*)    Chloride 97 (*)    Glucose, Bld 100 (*)    Creatinine, Ser 1.21 (*)    Calcium 8.7 (*)    Albumin 2.9 (*)    Alkaline Phosphatase 137 (*)    GFR calc non Af Amer 43 (*)    GFR calc Af Amer 50 (*)    All other components within normal limits  CBC WITH DIFFERENTIAL/PLATELET - Abnormal; Notable for the following components:   RBC 3.68 (*)    Hemoglobin 11.5 (*)    HCT 34.5 (*)    RDW 15.6 (*)    Neutro Abs 7.1 (*)    Lymphs Abs 0.6 (*)    All other components within normal limits  CULTURE, BLOOD (ROUTINE X 2)  CULTURE, BLOOD (ROUTINE X 2)  LACTIC ACID, PLASMA  PROTIME-INR  LACTIC ACID, PLASMA  URINALYSIS, COMPLETE (UACMP) WITH MICROSCOPIC   ____________________________________________   EKG  Interpreted by me Sinus tachycardia rate 126, left axis, normal intervals.  Normal QRS ST segments and T waves.  ____________________________________________    UKGURKY[facility]  Dg Chest Portable 1 View  Result Date: 07/17/2017 CLINICAL DATA:  Altered mental status today. Increased lethargy. History of left lower lobe lung carcinoma. EXAM: PORTABLE CHEST 1 VIEW COMPARISON:  CT chest 06/18/2017.  PA and lateral  chest 04/16/2017. FINDINGS: The lungs are emphysematous. Left pleural effusion and basilar airspace disease have increased since the most recent exam. The right lung is clear. No pneumothorax. Port-A-Cath is in place. Atherosclerosis is identified. Surgical clips left axilla noted. No acute bony abnormality. IMPRESSION: Small to moderate left pleural effusion and basilar airspace disease have worsened since the most recent examination could pneumonia, postobstructive pneumonitis or atelectasis in this patient with a history of lung carcinoma. Atherosclerosis. Emphysema. Electronically Signed   By: Inge Rise M.D.   On: 07/17/2017 13:11    ____________________________________________   PROCEDURES .Critical Care Performed by: Carrie Mew, MD Authorized by: Carrie Mew, MD   Critical care provider statement:    Critical care time (minutes):  35   Critical care time was exclusive of:  Separately billable procedures and treating other patients   Critical care was necessary to treat or prevent imminent or life-threatening deterioration of the following conditions:  Respiratory failure and sepsis   Critical care was time spent personally by me on the following activities:  Development of treatment plan with patient or surrogate, discussions with consultants, evaluation of patient's response to treatment, examination of patient, obtaining history from patient or surrogate, ordering and performing treatments and interventions, ordering and review of laboratory studies, ordering and review of radiographic studies, pulse oximetry, re-evaluation of patient's condition and review of old charts    ____________________________________________  [place] associated pneumonia, pneumothorax, pulmonary embolism  CLINICAL IMPRESSION / [place] /  ED COURSE  Pertinent labs & imaging results that were available during my care of the patient were reviewed by  me and considered in my medical decision making (see chart for details).    Patient with immunocompromised from chemotherapy presents with fever, tachycardia, tachypnea.  Abnormal lung exam, all concerning for pneumonia and sepsis.  Blood pressure is stable, she appears to be well perfused, not likely in shock.  Check labs, chest x-ray lactate blood culture, start antibiotics with cefepime and vancomycin.  Clinical Course as of Jul 18 1407  Fri Jul 17, 2017  1316 CXR c/w pnemonia and possible pleural effusion. Will plan to hospitalize for ongoing sepsis care   [PS]  1352 Baseline ckd. BP remains stable. Persistent tachycardia. Lactate normal.   Creatinine(!): 1.21 [PS]  1359 Sepsis reassessment has been completed.    [PS]    Clinical Course User Index [PS] Carrie Mew, MD     ____________________________________________   FINAL CLINICAL IMPRESSION(S) / ED DIAGNOSES    Final diagnoses:  HCAP (healthcare-associated pneumonia)  Sepsis, due to unspecified organism [facility])  Chronic respiratory failure with hypoxia [facility])     ED Discharge Orders    None      Portions of this note were generated with dragon dictation software. Dictation errors may occur despite best attempts at proofreading.    Carrie Mew, MD 07/17/17 [phone]

## 2017-07-17 NOTE — ED Notes (Signed)
Per EMS grandson reports that pt is more lethargic than normal and does not seem to be herself. Pt able to answer orientation questions, however when asked the year pt states that it is "[id number], no wait, it's 2019".

## 2017-07-17 NOTE — ED Triage Notes (Signed)
Pt to ED via [facility] from home for Shortness of breath. Per EMS pt has temp of 100.7, tachy at 120-125. Pt A & O x 3. Pt was given 1 duoneb in route. Pt is in NAD at this time. Pt is on 3 liter O2 at home.

## 2017-07-17 NOTE — Progress Notes (Signed)
Family Meeting Note  Advance Directive:yes  Today a meeting took place with the son.  Patient is unable to participate due JS:[id number] capacity confused   The following clinical team members were present during this meeting:MD  The following were discussed:Patient's diagnosis:breast cancer, Ac on ch respi failure, HCAP , Patient's progosis: Unable to determine and Goals for treatment: Full Code  Additional follow-up to be provided: PMD  Time spent during discussion:20 minutes  Vaughan Basta, MD

## 2017-07-17 NOTE — H&P (Signed)
[facility] [place] NAME: [name] [name]    MR#:  [id number]  DATE OF BIRTH:  [birth year]  DATE OF ADMISSION:  07/17/2017  PRIMARY CARE PHYSICIAN: Tracie Harrier, MD   REQUESTING/REFERRING PHYSICIAN: Joni Fears  CHIEF COMPLAINT:   Chief Complaint  Patient presents with  . Shortness of Breath    HISTORY OF PRESENT ILLNESS: [name] [name]  is a 77 y.o. female with a known history of CHF, Breast cacner, COPD, UTI, Htn, Bacteremia last month- finished Abx last week. She is progressively weak, decreased oral intake, chronic home oxygen use. Since last 1 to 2 days she is worsening confused and very weak so brought to emergency room by family. Noted to be hypoxic and requiring nonrebreather mask in ER.  Patient is confused so cannot give any details, called patient's son in details obtained from him.  PAST MEDICAL HISTORY:   Past Medical History:  Diagnosis Date  . Asthma   . Breast cancer ([place]) 2009   left  . Cancer ([place])   . CHF (congestive heart failure) ([place])   . CHF (congestive heart failure) ([place])   . Chronic UTI   . COPD (chronic obstructive pulmonary disease) ([place])   . Dizziness   . Fibromyalgia   . Hypertension   . Neuropathy   . Personal history of tobacco use, presenting hazards to health 05/17/2015  . Polyp, larynx   . RA (rheumatoid arthritis) ([place])   . Sinus infection    recent  . Stumbling gait    to the left  . Supplemental oxygen dependent    2.5l    PAST SURGICAL HISTORY:  Past Surgical History:  Procedure Laterality Date  . ABDOMINAL HYSTERECTOMY    . BREAST LUMPECTOMY Left 2009   chemo and radiation  . CYST EXCISION Left 02/27/2015   Procedure: CYST REMOVAL;  Surgeon: Hessie Knows, MD;  Location: [facility] ORS;  Service: Orthopedics;  Laterality: Left;  . EYE MUSCLE SURGERY Right    13 surgeries  . FLEXIBLE BRONCHOSCOPY N/A 07/01/2016   Procedure: FLEXIBLE BRONCHOSCOPY;  Surgeon: Wilhelmina Mcardle, MD;  Location:  [facility];  Service: Pulmonary;  Laterality: N/A;  . FLEXIBLE BRONCHOSCOPY N/A 02/09/2017   Procedure: FLEXIBLE BRONCHOSCOPY;  Surgeon: [name], MD;  Location: [facility] OR[facility];  Service: Pulmonary;  Laterality: N/A;  . KYPHOPLASTY N/A 05/22/2017   Procedure: Celine Ahr;  Surgeon: Hessie Knows, MD;  Location: [facility];  Service: Orthopedics;  Laterality: N/A;  . PORTA CATH INSERTION N/A 03/04/2017   Procedure: PORTA CATH INSERTION;  Surgeon: [name], MD;  Location: [facility];  Service: Cardiovascular;  Laterality: N/A;  . THUMB ARTHROSCOPY Left     SOCIAL HISTORY:  Social History   Tobacco Use  . Smoking status: Current Every Day Smoker    Packs/day: 1.00    Years: 40.00    Pack years: 40.00    Types: Cigarettes  . Smokeless tobacco: Never Used  Substance Use Topics  . Alcohol use: No    FAMILY HISTORY:  Family History  Problem Relation Age of Onset  . Diabetes Father   . Stroke Father   . Heart attack Father   . CAD Sister     DRUG ALLERGIES:  Allergies  Allergen Reactions  . Contrast Media [Iodinated Diagnostic Agents] Shortness Of Breath    Acute onset of shortness of breath following CT contrast administration 06/18/17. Sent to the ED. Dr. Kerman Passey documented incident as an allergic reaction. Needs  premedications prior to future contrast media injections. Previous questionable contrast allergy, so the patient was given entire or partial premedications without complications. She was not given any premedications on 06/18/17, resulting in shortness of breath and an ED visit.  . Sulfa Antibiotics Other (See Comments)    Unknown  . Tetracycline Hives  . White Petrolatum Other (See Comments)    Blisters  . Amoxicillin-Pot Clavulanate Rash and Other (See Comments)    Blisters in mouth Has patient had a PCN reaction causing immediate rash, facial/tongue/throat swelling, SOB or lightheadedness with hypotension: No Has patient had a PCN reaction  causing severe rash involving mucus membranes or skin necrosis: No Has patient had a PCN reaction that required hospitalization: No Has patient had a PCN reaction occurring within the last 10 years: No If all of the above answers are "NO", then may proceed with Cephalosporin use.   . Tape Rash    REVIEW OF SYSTEMS:   Pt is confused.  MEDICATIONS AT HOME:  Prior to Admission medications   Medication Sig Start Date End Date Taking? Authorizing Provider  albuterol (PROVENTIL HFA;VENTOLIN HFA) 108 (90 Base) MCG/ACT inhaler Inhale 1-2 puffs into the lungs every 6 (six) hours as needed for wheezing or shortness of breath. Patient taking differently: Inhale 2 puffs into the lungs every 6 (six) hours as needed for wheezing or shortness of breath.  02/16/17  Yes [name], MD  clonazePAM (KLONOPIN) 1 MG tablet Take 1 mg by mouth daily as needed for anxiety.    Yes [provider]  fentaNYL (DURAGESIC - DOSED MCG/HR) 50 MCG/HR Place 1 patch (50 mcg total) onto the skin every 3 (three) days. 07/06/17  Yes Karen Kitchens, NP  gabapentin (NEURONTIN) 600 MG tablet Take 600 mg by mouth 3 (three) times daily.  05/25/12  Yes [provider]  HYDROcodone-acetaminophen (NORCO/VICODIN) 5-325 MG tablet Take 1 tablet by mouth 2 (two) times daily as needed for pain. 06/26/17  Yes [provider]  lidocaine-prilocaine (EMLA) cream Apply 1 application topically as needed. Patient taking differently: Apply 1 application topically as needed (pain).  03/19/17  Yes Karen Kitchens, NP  LORazepam (ATIVAN) 0.5 MG tablet Take 1 tablet (0.5 mg total) by mouth every 6 (six) hours as needed (Nausea or vomiting). 03/19/17  Yes [name], Drue Second, MD  nitroGLYCERIN (NITROSTAT) 0.4 MG SL tablet Place 0.4 mg under the tongue every 5 (five) minutes as needed for chest pain.    Yes [provider]  ondansetron (ZOFRAN) 8 MG tablet Take 1 tablet (8 mg total) by mouth 2 (two) times daily as  needed. Start on the third day after chemotherapy. Patient taking differently: Take 8 mg by mouth 2 (two) times daily as needed for nausea or vomiting. Start on the third day after chemotherapy. 03/19/17  Yes [name], Drue Second, MD  traMADol (ULTRAM) 50 MG tablet Take 2 tablets (100 mg total) by mouth daily. Patient taking differently: Take 50 mg by mouth every 6 (six) hours as needed for moderate pain.  06/25/17  Yes Burns, Wandra Feinstein, NP  zolpidem (AMBIEN) 5 MG tablet Take 5 mg by mouth at bedtime as needed for sleep.    Yes [provider]  carvedilol (COREG) 12.5 MG tablet Take 1 tablet (12.5 mg total) by mouth 2 (two) times daily with a meal. Patient not taking: Reported on 07/17/2017 11/29/15   Hugelmeyer, Ubaldo Glassing, DO  mirabegron ER (MYRBETRIQ) 25 MG TB24 tablet Take 1 tablet (25 mg total) by  mouth daily. Patient not taking: Reported on 07/17/2017 06/11/17   Zara Council A, PA-C  oxyCODONE (OXY IR/ROXICODONE) 5 MG immediate release tablet Take 1 tablet (5 mg total) by mouth every 6 (six) hours as needed for severe pain. Patient not taking: Reported on 07/17/2017 06/12/17   Karen Kitchens, NP      PHYSICAL EXAMINATION:   VITAL SIGNS: Blood pressure (!) 144/82, pulse (!) 125, temperature (!) 100.5 F (38.1 C), temperature source Oral, resp. rate (!) 22, height _0  (1.549 m), weight 47.6 kg (105 lb), SpO2 99 %.  GENERAL:  77 y.o.-year-old patient lying in the bed with no acute distress.  EYES: Pupils equal, round, reactive to light and accommodation. No scleral icterus. Extraocular muscles intact.  HEENT: Head atraumatic, normocephalic. Oropharynx and nasopharynx clear.  NECK:  Supple, no jugular venous distention. No thyroid enlargement, no tenderness.  LUNGS: Normal breath sounds bilaterally, no wheezing, some b/l crepitation. No use of accessory muscles of respiration. Med port on RU chest. CARDIOVASCULAR: S1, S2 normal. No murmurs, rubs, or gallops.  ABDOMEN: Soft, nontender,  nondistended. Bowel sounds present. No organomegaly or mass.  EXTREMITIES: No pedal edema, cyanosis, or clubbing.  NEUROLOGIC: Cranial nerves II through XII are intact. Muscle strength 4/5 in all extremities. Sensation intact. Gait not checked.  PSYCHIATRIC: The patient is alert and oriented x 1.  SKIN: No obvious rash, lesion, or ulcer.   LABORATORY PANEL:   CBC Recent Labs  Lab 07/17/17 1250  WBC 8.6  HGB 11.5*  HCT 34.5*  PLT 225  MCV 93.8  MCH 31.3  MCHC 33.4  RDW 15.6*  LYMPHSABS 0.6*  MONOABS 0.7  EOSABS 0.2  BASOSABS 0.1   ------------------------------------------------------------------------------------------------------------------  Chemistries  Recent Labs  Lab 07/17/17 1250  NA 134*  K 4.5  CL 97*  CO2 29  GLUCOSE 100*  BUN 12  CREATININE 1.21*  CALCIUM 8.7*  AST 19  ALT 10  ALKPHOS 137*  BILITOT 0.9   ------------------------------------------------------------------------------------------------------------------ estimated creatinine clearance is 30.7 mL/min (A) (by C-G formula based on SCr of 1.21 mg/dL (H)). ------------------------------------------------------------------------------------------------------------------ No results for input(s): TSH, T4TOTAL, T3FREE, THYROIDAB in the last 72 hours.  Invalid input(s): FREET3   Coagulation profile Recent Labs  Lab 07/17/17 1250  INR 0.99   ------------------------------------------------------------------------------------------------------------------- No results for input(s): DDIMER in the last 72 hours. -------------------------------------------------------------------------------------------------------------------  Cardiac Enzymes No results for input(s): CKMB, TROPONINI, MYOGLOBIN in the last 168 hours.  Invalid input(s): CK ------------------------------------------------------------------------------------------------------------------ Invalid input(s):  POCBNP  ---------------------------------------------------------------------------------------------------------------  Urinalysis    Component Value Date/Time   COLORURINE YELLOW (A) 06/30/2017 0934   APPEARANCEUR HAZY (A) 06/30/2017 0934   APPEARANCEUR Cloudy (A) 06/11/2017 1526   LABSPEC 1.009 06/30/2017 0934   LABSPEC 1.014 10/11/2012 0040   PHURINE 6.0 06/30/2017 0934   GLUCOSEU NEGATIVE 06/30/2017 0934   GLUCOSEU Negative 10/11/2012 0040   HGBUR SMALL (A) 06/30/2017 0934   BILIRUBINUR NEGATIVE 06/30/2017 0934   BILIRUBINUR Negative 06/11/2017 1526   BILIRUBINUR Negative 10/11/2012 0040   KETONESUR NEGATIVE 06/30/2017 0934   PROTEINUR 30 (A) 06/30/2017 0934   NITRITE POSITIVE (A) 06/30/2017 0934   LEUKOCYTESUR LARGE (A) 06/30/2017 0934   LEUKOCYTESUR 3+ (A) 06/11/2017 1526   LEUKOCYTESUR 3+ 10/11/2012 0040     RADIOLOGY: Dg Chest Portable 1 View  Result Date: 07/17/2017 CLINICAL DATA:  Altered mental status today. Increased lethargy. History of left lower lobe lung carcinoma. EXAM: PORTABLE CHEST 1 VIEW COMPARISON:  CT chest 06/18/2017.  PA and lateral chest 04/16/2017. FINDINGS:  The lungs are emphysematous. Left pleural effusion and basilar airspace disease have increased since the most recent exam. The right lung is clear. No pneumothorax. Port-A-Cath is in place. Atherosclerosis is identified. Surgical clips left axilla noted. No acute bony abnormality. IMPRESSION: Small to moderate left pleural effusion and basilar airspace disease have worsened since the most recent examination could pneumonia, postobstructive pneumonitis or atelectasis in this patient with a history of lung carcinoma. Atherosclerosis. Emphysema. Electronically Signed   By: Inge Rise M.D.   On: 07/17/2017 13:11    EKG: Orders placed or performed during the hospital encounter of 07/17/17  . ED EKG  . ED EKG    IMPRESSION AND PLAN:  *Acute on chronic hypoxic respiratory  failure Sepsis Healthcare associated pneumonia  Continue supplemental oxygen. Continue IV fluids. Blood cultures are sent by ER, recent bacteremia last month. Broad-spectrum IV antibiotics for now and taper later on according to the cultures. Continue supplemental oxygen and try to taper.  *Altered mental status Most likely due to sepsis and infection Patient had frequent falls as per her son. I will get a CT head without contrast.  * breast cancer   Follow at cancer center    All the records are reviewed and case discussed with ED provider. Management plans discussed with the patient, family and they are in agreement.  CODE STATUS: full. Code Status History    Date Active Date Inactive Code Status Order ID Comments User Context   06/30/2017 1718 07/02/2017 2146 Full Code [id number]  Fritzi Mandes, MD Inpatient   05/21/2017 1813 05/24/2017 1542 Full Code [id number]  Demetrios Loll, MD Inpatient   03/04/2016 1835 03/06/2016 2112 Full Code [id number]  Dustin Flock, MD Inpatient   12/04/2015 1728 12/07/2015 1858 Full Code [id number]  Lytle Butte, MD ED   11/15/2015 0231 11/29/2015 1830 Full Code [id number]  Lance Coon, MD Inpatient    Advance Directive Documentation     Most Recent Value  Type of Advance Directive  Living will, Out of facility DNR (pink MOST or yellow form)  Pre-existing out of facility DNR order (yellow form or pink MOST form)  -  "MOST" Form in Place?  -       TOTAL TIME TAKING CARE OF THIS PATIENT: 50 minutes.    Vaughan Basta M.D on 07/17/2017   Between 7am to 6pm - Pager - [phone]  After 6pm go to [email or web address] - password [email or web address] [facility]  Office  [phone]  CC: Primary care physician; Tracie Harrier, MD   Note: This dictation was prepared with Dragon dictation along with smaller phrase technology. Any transcriptional errors that result from this process are unintentional.

## 2017-07-17 NOTE — Progress Notes (Signed)
Called to room to assess patient.  Patient currently on 100% NRB oxygen saturations are 100%.  Patient placed on HFNC at 8lpm approx. 52%,  oxygen saturations 97%.  Patient currently with diminished breath sounds to bases with scattered rhonchi, patient recently given breathing treatment in ED.  Will start scheduled breathing treatments at next scheduled time.  RN aware.

## 2017-07-17 NOTE — Plan of Care (Signed)

## 2017-07-17 NOTE — Progress Notes (Signed)
Pharmacy Antibiotic Note  [name] [name] is a 77 y.o. female admitted on 07/17/2017 with pneumonia and sepsis.  Pharmacy has been consulted for cerfepime and vancomycin dosing.  Plan: Cefepime 2 g iv q 12 hours.   Vancomycin 1000 mg iv once followed by 500 mg iv q 24 hours with stacked dosing. Will hold off on ordering a level. Will follow renal function closely and f/u PCT and MRSA PCR.   Height: 5\' 1"  (154.9 cm) Weight: 105 lb (47.6 kg) IBW/kg (Calculated) : 47.8  Temp (24hrs), Avg:100.6 F (38.1 C), Min:100.5 F (38.1 C), Max:100.6 F (38.1 C)  Recent Labs  Lab 07/17/17 1250 07/17/17 1549  WBC 8.6  --   CREATININE 1.21*  --   LATICACIDVEN 0.6 0.6    Estimated Creatinine Clearance: 30.7 mL/min (A) (by C-G formula based on SCr of 1.21 mg/dL (H)).    Allergies  Allergen Reactions  . Contrast Media [Iodinated Diagnostic Agents] Shortness Of Breath    Acute onset of shortness of breath following CT contrast administration 06/18/17. Sent to the ED. Dr. Kerman Passey documented incident as an allergic reaction. Needs premedications prior to future contrast media injections. Previous questionable contrast allergy, so the patient was given entire or partial premedications without complications. She was not given any premedications on 06/18/17, resulting in shortness of breath and an ED visit.  . Sulfa Antibiotics Other (See Comments)    Unknown  . Tetracycline Hives  . White Petrolatum Other (See Comments)    Blisters  . Amoxicillin-Pot Clavulanate Rash and Other (See Comments)    Blisters in mouth Has patient had a PCN reaction causing immediate rash, facial/tongue/throat swelling, SOB or lightheadedness with hypotension: No Has patient had a PCN reaction causing severe rash involving mucus membranes or skin necrosis: No Has patient had a PCN reaction that required hospitalization: No Has patient had a PCN reaction occurring within the last 10 years: No If all of the above answers are  "NO", then may proceed with Cephalosporin use.   . Tape Rash    Antimicrobials this admission: cefepime 7/5 >>  vancomycin 7/5 >>   Dose adjustments this admission:   Microbiology results: 7/5 BCx: sent MRSA PCR:   Thank you for allowing pharmacy to be a part of this patient's care.  Napoleon Form 07/17/2017 5:53 PM

## 2017-07-17 NOTE — ED Notes (Addendum)
Pt is on 3 lpm home oxygen at all times. She was 94% on 3 lpm [place] on arrival. She began to desat to 85-88% on [place]. Placed pt on 15 lpm NRB and sats are 92-95%.

## 2017-07-18 LAB — CBC
HCT: 28.4 % — ABNORMAL LOW (ref 35.0–47.0)
Hemoglobin: 9.5 g/dL — ABNORMAL LOW (ref 12.0–16.0)
MCH: 31.7 pg (ref 26.0–34.0)
MCHC: 33.5 g/dL (ref 32.0–36.0)
MCV: 94.6 fL (ref 80.0–100.0)
Platelets: 167 10*3/uL (ref 150–440)
RBC: 3.01 MIL/uL — ABNORMAL LOW (ref 3.80–5.20)
RDW: 15.7 % — AB (ref 11.5–14.5)
WBC: 8 10*3/uL (ref 3.6–11.0)

## 2017-07-18 LAB — URINALYSIS, COMPLETE (UACMP) WITH MICROSCOPIC
BILIRUBIN URINE: NEGATIVE
Bacteria, UA: NONE SEEN
Glucose, UA: NEGATIVE mg/dL
HGB URINE DIPSTICK: NEGATIVE
Ketones, ur: 5 mg/dL — AB
Leukocytes, UA: NEGATIVE
NITRITE: NEGATIVE
PH: 5 (ref 5.0–8.0)
Protein, ur: NEGATIVE mg/dL
SPECIFIC GRAVITY, URINE: 1.011 (ref 1.005–1.030)
Squamous Epithelial / LPF: NONE SEEN (ref 0–5)

## 2017-07-18 LAB — BASIC METABOLIC PANEL
Anion gap: 8 (ref 5–15)
BUN: 16 mg/dL (ref 8–23)
CALCIUM: 8 mg/dL — AB (ref 8.9–10.3)
CO2: 26 mmol/L (ref 22–32)
CREATININE: 1.05 mg/dL — AB (ref 0.44–1.00)
Chloride: 105 mmol/L (ref 98–111)
GFR calc non Af Amer: 51 mL/min — ABNORMAL LOW (ref >60)
GFR, EST AFRICAN AMERICAN: 59 mL/min — AB (ref >60)
Glucose, Bld: 137 mg/dL — ABNORMAL HIGH (ref 70–99)
Potassium: 4.6 mmol/L (ref 3.5–5.1)
Sodium: 139 mmol/L (ref 135–145)

## 2017-07-18 MED ORDER — MELATONIN 5 MG PO TABS
5.0000 mg | ORAL_TABLET | Freq: Every evening | ORAL | Status: DC | PRN
  Filled 2017-07-18: qty 1

## 2017-07-18 MED ORDER — ZOLPIDEM TARTRATE 5 MG PO TABS
5.0000 mg | ORAL_TABLET | Freq: Every evening | ORAL | Status: DC | PRN
  Administered 2017-07-18: 5 mg via ORAL
  Filled 2017-07-18: qty 1

## 2017-07-18 NOTE — Progress Notes (Signed)
Pt has not urinated since 7/5 overnight. This nurse had NT Katie bladder scan pt. Pt retaining 114mL urine. MD notified. MD ordered in and out cath. Will continue to monitor.

## 2017-07-18 NOTE — Plan of Care (Signed)
  Problem: Education: Goal: Knowledge of General Education information will improve Outcome: Progressing   Problem: Health Behavior/Discharge Planning: Goal: Ability to manage health-related needs will improve Outcome: Progressing   Problem: Clinical Measurements: Goal: Ability to maintain clinical measurements within normal limits will improve Outcome: Progressing Goal: Will remain free from infection Outcome: Progressing Goal: Diagnostic test results will improve Outcome: Progressing Goal: Respiratory complications will improve Outcome: Progressing Goal: Cardiovascular complication will be avoided Outcome: Progressing   Problem: Activity: Goal: Risk for activity intolerance will decrease Outcome: Progressing   Problem: Coping: Goal: Level of anxiety will decrease Outcome: Progressing   Problem: Elimination: Goal: Will not experience complications related to bowel motility Outcome: Progressing Goal: Will not experience complications related to urinary retention Outcome: Progressing   Problem: Pain Managment: Goal: General experience of comfort will improve Outcome: Progressing   Problem: Safety: Goal: Ability to remain free from injury will improve Outcome: Progressing   Problem: Skin Integrity: Goal: Risk for impaired skin integrity will decrease Outcome: Progressing   Problem: Nutrition: Goal: Adequate nutrition will be maintained Outcome: Not Progressing

## 2017-07-18 NOTE — Progress Notes (Signed)
[place] [facility] [place] NAME: [name] [name]    MR#:  [id number]  DATE OF BIRTH:  [birth year]  SUBJECTIVE: Admitted for lethargy, shortness of breath and found to have healthcare associated pneumonia she was kept n.p.o. because she was lethargic.  Patient now is very alert, awake, oriented told me that she is having shortness of breath.  No cough.  Start the patient on the diet.  Patient afebrile.  CHIEF COMPLAINT:   Chief Complaint  Patient presents with  . Shortness of Breath    REVIEW OF SYSTEMS:    Review of Systems  Constitutional: Negative for chills and fever.  HENT: Negative for hearing loss.   Eyes: Negative for blurred vision, double vision and photophobia.  Respiratory: Positive for shortness of breath. Negative for cough and hemoptysis.   Cardiovascular: Negative for palpitations, orthopnea and leg swelling.  Gastrointestinal: Negative for abdominal pain, diarrhea and vomiting.  Genitourinary: Negative for dysuria and urgency.  Musculoskeletal: Negative for myalgias and neck pain.  Skin: Negative for rash.  Neurological: Negative for dizziness, focal weakness, seizures, weakness and headaches.  Psychiatric/Behavioral: Negative for memory loss. The patient does not have insomnia.     Nutrition:  Tolerating Diet: Tolerating PT:      DRUG ALLERGIES:   Allergies  Allergen Reactions  . Contrast Media [Iodinated Diagnostic Agents] Shortness Of Breath    Acute onset of shortness of breath following CT contrast administration 06/18/17. Sent to the ED. Dr. Kerman Passey documented incident as an allergic reaction. Needs premedications prior to future contrast media injections. Previous questionable contrast allergy, so the patient was given entire or partial premedications without complications. She was not given any premedications on 06/18/17, resulting in shortness of breath and an ED visit.  . Sulfa Antibiotics Other (See  Comments)    Unknown  . Tetracycline Hives  . White Petrolatum Other (See Comments)    Blisters  . Amoxicillin-Pot Clavulanate Rash and Other (See Comments)    Blisters in mouth Has patient had a PCN reaction causing immediate rash, facial/tongue/throat swelling, SOB or lightheadedness with hypotension: No Has patient had a PCN reaction causing severe rash involving mucus membranes or skin necrosis: No Has patient had a PCN reaction that required hospitalization: No Has patient had a PCN reaction occurring within the last 10 years: No If all of the above answers are "NO", then may proceed with Cephalosporin use.   . Tape Rash    VITALS:  Blood pressure (!) 109/46, pulse 91, temperature (!) 97.5 F (36.4 C), temperature source Oral, resp. rate (!) 21, height 5\' 1"  (1.549 m), weight 47.6 kg (105 lb), SpO2 93 %.  PHYSICAL EXAMINATION:   Physical Exam  GENERAL:  77 y.o.-year-old patient lying in the bed with no acute distress.,  Patient alert, awake, oriented. EYES: Pupils equal, round, reactive to light and accommodation. No scleral icterus. Extraocular muscles intact.  HEENT: Head atraumatic, normocephalic. Oropharynx and nasopharynx clear.  NECK:  Supple, no jugular venous distention. No thyroid enlargement, no tenderness.  LUNGS:  diminished breath sounds bilaterally but more so on the right side    cARDIOVASCULAR: S1, S2 normal. No murmurs, rubs, or gallops.  ABDOMEN: Soft, nontender, nondistended. Bowel sounds present. No organomegaly or mass.  EXTREMITIES: No pedal edema, cyanosis, or clubbing.  NEUROLOGIC: Cranial nerves II through XII are intact. Muscle strength 5/5 in all extremities. Sensation intact. Gait not checked.  PSYCHIATRIC: The patient is alert and oriented x 3.  SKIN:  No obvious rash, lesion, or ulcer.    LABORATORY PANEL:   CBC Recent Labs  Lab 07/18/17 0505  WBC 8.0  HGB 9.5*  HCT 28.4*  PLT 167    ------------------------------------------------------------------------------------------------------------------  Chemistries  Recent Labs  Lab 07/17/17 1250 07/18/17 0505  NA 134* 139  K 4.5 4.6  CL 97* 105  CO2 29 26  GLUCOSE 100* 137*  BUN 12 16  CREATININE 1.21* 1.05*  CALCIUM 8.7* 8.0*  AST 19  --   ALT 10  --   ALKPHOS 137*  --   BILITOT 0.9  --    ------------------------------------------------------------------------------------------------------------------  Cardiac Enzymes No results for input(s): TROPONINI in the last 168 hours. ------------------------------------------------------------------------------------------------------------------  RADIOLOGY:  Ct Head Wo Contrast  Result Date: 07/17/2017 CLINICAL DATA:  Increased lethargy today. EXAM: CT HEAD WITHOUT CONTRAST TECHNIQUE: Contiguous axial images were obtained from the base of the skull through the vertex without intravenous contrast. COMPARISON:  Head CT scan 06/30/2017 and 12/11/2016. Brain MRI 11/26/2015. FINDINGS: Brain: No evidence of acute infarction, hemorrhage, hydrocephalus, extra-axial collection or mass lesion/mass effect. There is cortical atrophy and some chronic microvascular ischemic change. Vascular: Atherosclerosis noted. Skull: Intact. Sinuses/Orbits: Small mucous retention cyst or polyp anterior right maxillary sinus noted. The patient is status post bilateral maxillary antrostomy. Other: None. IMPRESSION: No acute intracranial abnormality. Cortical atrophy and chronic microvascular ischemic change. Atherosclerosis. Electronically Signed   By: Inge Rise M.D.   On: 07/17/2017 16:17   Dg Chest Portable 1 View  Result Date: 07/17/2017 CLINICAL DATA:  Altered mental status today. Increased lethargy. History of left lower lobe lung carcinoma. EXAM: PORTABLE CHEST 1 VIEW COMPARISON:  CT chest 06/18/2017.  PA and lateral chest 04/16/2017. FINDINGS: The lungs are emphysematous. Left pleural  effusion and basilar airspace disease have increased since the most recent exam. The right lung is clear. No pneumothorax. Port-A-Cath is in place. Atherosclerosis is identified. Surgical clips left axilla noted. No acute bony abnormality. IMPRESSION: Small to moderate left pleural effusion and basilar airspace disease have worsened since the most recent examination could pneumonia, postobstructive pneumonitis or atelectasis in this patient with a history of lung carcinoma. Atherosclerosis. Emphysema. Electronically Signed   By: Inge Rise M.D.   On: 07/17/2017 13:11     ASSESSMENT AND PLAN:   Principal Problem:   Acute respiratory failure with hypoxia (HCC) Active Problems:   Sepsis ([place])   HCAP (healthcare-associated pneumonia)   Acute respiratory failure ([place])  Acute on chronic respiratory failure with hypoxia secondary to healthcare associated pneumonia, patient uses uses 2.4 L of oxygen at home but now she is on 8 L of oxygen secondary to pneumonia: Continue oxygen and titrate down to keep sats more than 90%, continue broad-spectrum antibiotics vancomycin, cefepime, bronchodilators, IV steroids.   2.  Lethargy secondary to metabolic encephalopathy from pneumonia:Patient CT head unremarkable on admission did not show acute stroke or metastatic disease.  Patient is alert, awake, oriented.  And wants to eat.  DC n.p.o., start soft diet, Because of metabolic encephalopathy patient Ativan, tramadol, Ambien, oxycodone, Klonopin have been held on admission.  Continue to watch today, continue fentanyl patch for pain control, Neurontin.  3.  Recent bacteremia with recurrent UTI: Patient blood cultures, urine culture showed ESBL E. coli, patient received meropenem at that time, discharged with Invanz for 7 days patient was admitted on June 18 for the bacteremia and finished antibiotic treatment.   All the records are reviewed and case discussed with Care Management/Social Workerr. Management  plans discussed with the patient, family and they are in agreement.  CODE STATUS: Full code  TOTAL TIME TAKING CARE OF THIS PATIENT: 38 minutes.   POSSIBLE D/C IN 1-2 DAYS, DEPENDING ON CLINICAL CONDITION.   Epifanio Lesches M.D on 07/18/2017 at 8:19 AM  Between 7am to 6pm - Pager - [phone]  After 6pm go to [email or web address] - password [email or web address] [facility]  Office  [phone]  CC: Primary care physician; Tracie Harrier, MD

## 2017-07-18 NOTE — Progress Notes (Addendum)
Dr. Duane Boston ordered 5 mg melatonin as needed for sleep per patient request. Contacted pharmacy [facility]) to put order in.   Called pharmacy--notified that melatonin is out of stock. Contacted Dr. Duane Boston. See new order.

## 2017-07-18 NOTE — Progress Notes (Signed)
Pt BP 96/53. NT took BP twice to verify. MD notified. No new orders at this time. Will continue to monitor.

## 2017-07-19 MED ORDER — RAMELTEON 8 MG PO TABS
8.0000 mg | ORAL_TABLET | Freq: Every day | ORAL | Status: DC
  Filled 2017-07-19: qty 1

## 2017-07-19 MED ORDER — ADULT MULTIVITAMIN W/MINERALS CH
1.0000 | ORAL_TABLET | Freq: Every day | ORAL | Status: DC
  Administered 2017-07-20: 10:00:00 1 via ORAL
  Filled 2017-07-19: qty 1

## 2017-07-19 MED ORDER — ACETAMINOPHEN 325 MG PO TABS: 650.0000 mg | ORAL_TABLET | Freq: Four times a day (QID) | ORAL | Status: DC | PRN

## 2017-07-19 MED ORDER — RAMELTEON 8 MG PO TABS
8.0000 mg | ORAL_TABLET | Freq: Every day | ORAL | Status: DC
  Administered 2017-07-19: 8 mg via ORAL
  Filled 2017-07-19 (×2): qty 1

## 2017-07-19 MED ORDER — IPRATROPIUM-ALBUTEROL 0.5-2.5 (3) MG/3ML IN SOLN: 3.0000 mL | RESPIRATORY_TRACT | Status: DC | PRN

## 2017-07-19 MED ORDER — IPRATROPIUM-ALBUTEROL 0.5-2.5 (3) MG/3ML IN SOLN
3.0000 mL | Freq: Four times a day (QID) | RESPIRATORY_TRACT | Status: DC
  Administered 2017-07-19 – 2017-07-20 (×5): 3 mL via RESPIRATORY_TRACT
  Filled 2017-07-19 (×5): qty 3

## 2017-07-19 MED ORDER — MIRABEGRON ER 25 MG PO TB24
25.0000 mg | ORAL_TABLET | Freq: Every day | ORAL | Status: DC
  Administered 2017-07-19 – 2017-07-20 (×2): 25 mg via ORAL
  Filled 2017-07-19 (×2): qty 1

## 2017-07-19 NOTE — Plan of Care (Signed)
  Problem: Education: Goal: Knowledge of General Education information will improve Outcome: Progressing   Problem: Health Behavior/Discharge Planning: Goal: Ability to manage health-related needs will improve Outcome: Progressing   Problem: Elimination: Goal: Will not experience complications related to bowel motility Outcome: Progressing Goal: Will not experience complications related to urinary retention Outcome: Progressing   Problem: Pain Managment: Goal: General experience of comfort will improve Outcome: Progressing   Problem: Safety: Goal: Ability to remain free from injury will improve Outcome: Progressing   Problem: Skin Integrity: Goal: Risk for impaired skin integrity will decrease Outcome: Progressing

## 2017-07-19 NOTE — Progress Notes (Signed)
Initial Nutrition Assessment  DOCUMENTATION CODES:   Non-severe (moderate) malnutrition in context of chronic illness  INTERVENTION:  Agree with regular diet.  Provide chocolate milk TID with each tray. Each 8 fl. oz. cup of chocolate milk contains 150 kcal and 9 grams of protein.  Provide Magic cup TID with meals, each supplement provides 290 kcal and 9 grams of protein. Patient prefers chocolate.  Provide daily MVI.  NUTRITION DIAGNOSIS:   Moderate Malnutrition related to chronic illness(metastatic SCC to LLL, COPD, CHF) as evidenced by mild fat depletion, moderate fat depletion, mild muscle depletion, moderate muscle depletion.  GOAL:   Patient will meet greater than or equal to 90% of their needs  MONITOR:   PO intake, Supplement acceptance, Labs, Weight trends, I & O's  REASON FOR ASSESSMENT:   Malnutrition Screening Tool    ASSESSMENT:   77 year old female with PMHx of HTN, CHF, asthma, COPD, fibromyalgia, RA, neuropathy, dizziness, stumbling gait, hx breast cancer s/p left lumpectomy in 2009 and chemotherapy/XRT, presumed metastatic SCC of left lower lobe who is now admitted with PNA, recent bacteremia with recurrent UTI, urinary retention.   -Per oncology notes it appears patient's last round of chemotherapy was truncated due to PNA and has since been on hold.  Met with patient at bedside. She reports she has had a decreased appetite since she was diagnosed with lung cancer at the beginning of the year. She eats one meal per day now. She reports her intake at that meal varies. Her son prepares meals for her now. She occasionally has nausea but as long as she eats food without strong odors, her nausea can stay controlled with nausea medication. She dislikes oral nutrition supplements. She does like chocolate milk and ice cream.  UBW 119lbs. Pt reports she was 104 lbs on admission. RD obtained bed scale weight of 54.1 kg (119.3 lbs).  Medications reviewed and include:  fentanyl patch, gabapentin, Solu-Medrol 60 mg Q24hrs IV, NS @ 50 mL/hr, cefepime, vancomycin.  Labs reviewed: Creatinine 1.05, eGFR 51.  NUTRITION - FOCUSED PHYSICAL EXAM:    Most Recent Value  Orbital Region  Mild depletion  Upper Arm Region  Moderate depletion  Thoracic and Lumbar Region  Mild depletion  Buccal Region  Mild depletion  Temple Region  Mild depletion  Clavicle Bone Region  Mild depletion  Clavicle and Acromion Bone Region  Mild depletion  Scapular Bone Region  Mild depletion  Dorsal Hand  Mild depletion  Patellar Region  Moderate depletion  Anterior Thigh Region  Moderate depletion  Posterior Calf Region  Moderate depletion  Edema (RD Assessment)  None  Hair  Reviewed [no hair s/p chemotherapy]  Eyes  Reviewed  Mouth  Reviewed  Skin  Reviewed  Nails  Reviewed     Diet Order:   Diet Order           Diet regular Room service appropriate? Yes; Fluid consistency: Thin  Diet effective now          EDUCATION NEEDS:   No education needs have been identified at this time  Skin:  Skin Assessment: Reviewed RN Assessment(ecchymosis to bilateral arms)  Last BM:  07/19/2017 - medium type 1  Height:   Ht Readings from Last 1 Encounters:  07/17/17 _0  (1.549 m)    Weight:   Wt Readings from Last 1 Encounters:  07/19/17 119 lb 4.3 oz (54.1 kg)    Ideal Body Weight:  47.7 kg  BMI:  Body mass index is  22.54 kg/m.  Estimated Nutritional Needs:   Kcal:  1600-1800  Protein:  65-80 grams  Fluid:  1.6-1.8 L/day  Willey Blade, MS, RD, LDN Office: [phone] Pager: [phone] After Hours/Weekend Pager: [phone]

## 2017-07-19 NOTE — Progress Notes (Signed)
[place] at [place] NAME: [name] [name]    MR#:  [id number]  DATE OF BIRTH:  [birth year]  Patient had urine retention, returned about 1100 cc of urine for which nurse had to do in and out cath yesterday evening.  Still not voiding. Patient shortness of breath improved, afebrile.  Alert, awake, oriented.  CHIEF COMPLAINT:   Chief Complaint  Patient presents with  . Shortness of Breath    REVIEW OF SYSTEMS:    Review of Systems  Constitutional: Negative for chills and fever.  HENT: Negative for hearing loss.   Eyes: Negative for blurred vision, double vision and photophobia.  Respiratory: Positive for shortness of breath. Negative for cough and hemoptysis.   Cardiovascular: Negative for palpitations, orthopnea and leg swelling.  Gastrointestinal: Negative for abdominal pain, diarrhea and vomiting.  Genitourinary: Negative for dysuria and urgency.  Musculoskeletal: Negative for myalgias and neck pain.  Skin: Negative for rash.  Neurological: Negative for dizziness, focal weakness, seizures, weakness and headaches.  Psychiatric/Behavioral: Negative for memory loss. The patient does not have insomnia.     Nutrition:  Tolerating Diet: Tolerating PT:      DRUG ALLERGIES:   Allergies  Allergen Reactions  . Contrast Media [Iodinated Diagnostic Agents] Shortness Of Breath    Acute onset of shortness of breath following CT contrast administration 06/18/17. Sent to the ED. Dr. Kerman Passey documented incident as an allergic reaction. Needs premedications prior to future contrast media injections. Previous questionable contrast allergy, so the patient was given entire or partial premedications without complications. She was not given any premedications on 06/18/17, resulting in shortness of breath and an ED visit.  . Sulfa Antibiotics Other (See Comments)    Unknown  . Tetracycline Hives  . White Petrolatum Other (See Comments)     Blisters  . Amoxicillin-Pot Clavulanate Rash and Other (See Comments)    Blisters in mouth Has patient had a PCN reaction causing immediate rash, facial/tongue/throat swelling, SOB or lightheadedness with hypotension: No Has patient had a PCN reaction causing severe rash involving mucus membranes or skin necrosis: No Has patient had a PCN reaction that required hospitalization: No Has patient had a PCN reaction occurring within the last 10 years: No If all of the above answers are "NO", then may proceed with Cephalosporin use.   . Tape Rash    VITALS:  Blood pressure (!) 122/57, pulse 94, temperature 97.7 F (36.5 C), temperature source Oral, resp. rate 18, height 5\' 1"  (1.549 m), weight 47.6 kg (105 lb), SpO2 94 %.  PHYSICAL EXAMINATION:   Physical Exam  GENERAL:  77 y.o.-year-old patient lying in the bed with no acute distress.,  Patient alert, awake, oriented. EYES: Pupils equal, round, reactive to light and accommodation. No scleral icterus. Extraocular muscles intact.  HEENT: Head atraumatic, normocephalic. Oropharynx and nasopharynx clear.  NECK:  Supple, no jugular venous distention. No thyroid enlargement, no tenderness.  LUNGS:  diminished breath sounds bilaterally but more so on the right side    cARDIOVASCULAR: S1, S2 normal. No murmurs, rubs, or gallops.  ABDOMEN: Soft, nontender, nondistended. Bowel sounds present. No organomegaly or mass.  EXTREMITIES: No pedal edema, cyanosis, or clubbing.  NEUROLOGIC: Cranial nerves II through XII are intact. Muscle strength 5/5 in all extremities. Sensation intact. Gait not checked.  PSYCHIATRIC: The patient is alert and oriented x 3.  SKIN: No obvious rash, lesion, or ulcer.    LABORATORY PANEL:   CBC Recent Labs  Lab  07/18/17 0505  WBC 8.0  HGB 9.5*  HCT 28.4*  PLT 167   ------------------------------------------------------------------------------------------------------------------  Chemistries  Recent Labs  Lab  07/17/17 1250 07/18/17 0505  NA 134* 139  K 4.5 4.6  CL 97* 105  CO2 29 26  GLUCOSE 100* 137*  BUN 12 16  CREATININE 1.21* 1.05*  CALCIUM 8.7* 8.0*  AST 19  --   ALT 10  --   ALKPHOS 137*  --   BILITOT 0.9  --    ------------------------------------------------------------------------------------------------------------------  Cardiac Enzymes No results for input(s): TROPONINI in the last 168 hours. ------------------------------------------------------------------------------------------------------------------  RADIOLOGY:  Ct Head Wo Contrast  Result Date: 07/17/2017 CLINICAL DATA:  Increased lethargy today. EXAM: CT HEAD WITHOUT CONTRAST TECHNIQUE: Contiguous axial images were obtained from the base of the skull through the vertex without intravenous contrast. COMPARISON:  Head CT scan 06/30/2017 and 12/11/2016. Brain MRI 11/26/2015. FINDINGS: Brain: No evidence of acute infarction, hemorrhage, hydrocephalus, extra-axial collection or mass lesion/mass effect. There is cortical atrophy and some chronic microvascular ischemic change. Vascular: Atherosclerosis noted. Skull: Intact. Sinuses/Orbits: Small mucous retention cyst or polyp anterior right maxillary sinus noted. The patient is status post bilateral maxillary antrostomy. Other: None. IMPRESSION: No acute intracranial abnormality. Cortical atrophy and chronic microvascular ischemic change. Atherosclerosis. Electronically Signed   By: Inge Rise M.D.   On: 07/17/2017 16:17   Dg Chest Portable 1 View  Result Date: 07/17/2017 CLINICAL DATA:  Altered mental status today. Increased lethargy. History of left lower lobe lung carcinoma. EXAM: PORTABLE CHEST 1 VIEW COMPARISON:  CT chest 06/18/2017.  PA and lateral chest 04/16/2017. FINDINGS: The lungs are emphysematous. Left pleural effusion and basilar airspace disease have increased since the most recent exam. The right lung is clear. No pneumothorax. Port-A-Cath is in place.  Atherosclerosis is identified. Surgical clips left axilla noted. No acute bony abnormality. IMPRESSION: Small to moderate left pleural effusion and basilar airspace disease have worsened since the most recent examination could pneumonia, postobstructive pneumonitis or atelectasis in this patient with a history of lung carcinoma. Atherosclerosis. Emphysema. Electronically Signed   By: Inge Rise M.D.   On: 07/17/2017 13:11     ASSESSMENT AND PLAN:   Principal Problem:   Acute respiratory failure with hypoxia (HCC) Active Problems:   Sepsis ([place])   HCAP (healthcare-associated pneumonia)   Acute respiratory failure ([place])  Acute on chronic respiratory failure with hypoxia secondary to healthcare associated pneumonia, patient uses uses 2.4 L of oxygen at home patient initially required 8 L of oxygen but no decreased to 5 L.  Patient is clinically improving, wean down oxygen as much as possible today.  Continue IV antibiotics..   2.  metabolic encephalopathy: Improved, Continue present medicines, patient is not , her Klonopin.  Or  Ativan.   3.  Recent bacteremia with recurrent UTI: Patient blood cultures, urine culture showed ESBL E. coli, patient received meropenem at that time, discharged with Invanz for 7 days   #4 urine retention; patient is on Myrbetriq at home, restart that.  All the records are reviewed and case discussed with Care Management/Social Workerr. Management plans discussed with the patient, family and they are in agreement.  CODE STATUS: Full code  TOTAL TIME TAKING CARE OF THIS PATIENT: 38 minutes.   POSSIBLE D/C IN 1-2 DAYS, DEPENDING ON CLINICAL CONDITION.   Epifanio Lesches M.D on 07/19/2017 at 11:08 AM  Between 7am to 6pm - Pager - [phone]  After 6pm go to [email or web address] - password [email or web address] [facility]  [facility]  Office  [phone]  CC: Primary care physician; Tracie Harrier, MD

## 2017-07-19 NOTE — Progress Notes (Signed)
Pharmacy Antibiotic Note  [name] [name] is a 76 y.o. female admitted on 07/17/2017 with pneumonia and sepsis.  Pharmacy has been consulted for cerfepime and vancomycin dosing.  Plan: Cefepime 2 g iv q 12 hours.   Vancomycin 1000 mg iv once followed by 500 mg iv q 24 hours with stacked dosing.  Trough level ordered for 07/20/17 at 22:30.   Height: 5\' 1"  (154.9 cm) Weight: 105 lb (47.6 kg) IBW/kg (Calculated) : 47.8  Temp (24hrs), Avg:97.8 F (36.6 C), Min:97.5 F (36.4 C), Max:97.9 F (36.6 C)  Recent Labs  Lab 07/17/17 1250 07/17/17 1549 07/18/17 0505  WBC 8.6  --  8.0  CREATININE 1.21*  --  1.05*  LATICACIDVEN 0.6 0.6  --     Estimated Creatinine Clearance: 35.3 mL/min (A) (by C-G formula based on SCr of 1.05 mg/dL (H)).    Allergies  Allergen Reactions  . Contrast Media [Iodinated Diagnostic Agents] Shortness Of Breath    Acute onset of shortness of breath following CT contrast administration 06/18/17. Sent to the ED. Dr. Kerman Passey documented incident as an allergic reaction. Needs premedications prior to future contrast media injections. Previous questionable contrast allergy, so the patient was given entire or partial premedications without complications. She was not given any premedications on 06/18/17, resulting in shortness of breath and an ED visit.  . Sulfa Antibiotics Other (See Comments)    Unknown  . Tetracycline Hives  . White Petrolatum Other (See Comments)    Blisters  . Amoxicillin-Pot Clavulanate Rash and Other (See Comments)    Blisters in mouth Has patient had a PCN reaction causing immediate rash, facial/tongue/throat swelling, SOB or lightheadedness with hypotension: No Has patient had a PCN reaction causing severe rash involving mucus membranes or skin necrosis: No Has patient had a PCN reaction that required hospitalization: No Has patient had a PCN reaction occurring within the last 10 years: No If all of the above answers are "NO", then may proceed  with Cephalosporin use.   . Tape Rash    Antimicrobials this admission: cefepime 7/5 >>  vancomycin 7/5 >>   Dose adjustments this admission:   Microbiology results: 7/5 BCx: sent MRSA PCR:   Thank you for allowing pharmacy to be a part of this patient's care.  Olivia Canter, [facility] 07/19/2017 11:26 AM

## 2017-07-20 ENCOUNTER — Encounter: Payer: Self-pay | Admitting: Internal Medicine

## 2017-07-20 MED ORDER — PREDNISONE 10 MG (21) PO TBPK: ORAL_TABLET | ORAL | 0 refills | Status: DC

## 2017-07-20 MED ORDER — LEVOFLOXACIN 500 MG PO TABS: 500.0000 mg | ORAL_TABLET | Freq: Every day | ORAL | 0 refills | Status: AC

## 2017-07-20 MED ORDER — HEPARIN SOD (PORK) LOCK FLUSH 100 UNIT/ML IV SOLN
500.0000 [IU] | Freq: Once | INTRAVENOUS | Status: AC
  Administered 2017-07-20: 500 [IU] via INTRAVENOUS
  Filled 2017-07-20: qty 5

## 2017-07-20 MED ORDER — HEPARIN SOD (PORK) LOCK FLUSH 100 UNIT/ML IV SOLN: 500.0000 [IU] | Freq: Once | INTRAVENOUS | Status: DC

## 2017-07-20 MED ORDER — LEVOFLOXACIN 750 MG PO TABS: 750.0000 mg | ORAL_TABLET | ORAL | 0 refills | Status: DC

## 2017-07-20 NOTE — Progress Notes (Signed)
Patient discharged home per MD order. All discharge instructions given and all questions answered. 

## 2017-07-20 NOTE — Discharge Summary (Addendum)
[name] [name], is a 77 y.o. female  DOB 1940-12-18  MRN [id number].  Admission date:  07/17/2017  Admitting Physician  Vaughan Basta, MD  Discharge Date:  07/20/2017   Primary MD  Tracie Harrier, MD  Recommendations for primary care physician for things to follow:   Follow-up with PCP in 1 week   Admission Diagnosis  Chronic respiratory failure with hypoxia (HCC) [J96.11] HCAP (healthcare-associated pneumonia) [J18.9] Sepsis, due to unspecified organism [name]) [A41.9]   Discharge Diagnosis  Chronic respiratory failure with hypoxia ([place]) [J96.11] HCAP (healthcare-associated pneumonia) [J18.9] Sepsis, due to unspecified organism [facility]) [A41.9]    Principal Problem:   Acute respiratory failure with hypoxia ([place]) Active Problems:   Sepsis ([place])   HCAP (healthcare-associated pneumonia)   Acute respiratory failure ([place])      Past Medical History:  Diagnosis Date  . Asthma   . Breast cancer ([place]) 2009   left  . Cancer ([place])   . CHF (congestive heart failure) ([place])   . CHF (congestive heart failure) ([place])   . Chronic UTI   . COPD (chronic obstructive pulmonary disease) ([place])   . Dizziness   . Fibromyalgia   . Hypertension   . Neuropathy   . Personal history of tobacco use, presenting hazards to health 05/17/2015  . Polyp, larynx   . RA (rheumatoid arthritis) ([place])   . Sinus infection    recent  . Stumbling gait    to the left  . Supplemental oxygen dependent    2.5l    Past Surgical History:  Procedure Laterality Date  . ABDOMINAL HYSTERECTOMY    . BREAST LUMPECTOMY Left 2009   chemo and radiation  . CYST EXCISION Left 02/27/2015   Procedure: CYST REMOVAL;  Surgeon: Hessie Knows, MD;  Location: [facility] ORS;  Service: Orthopedics;  Laterality: Left;  . EYE MUSCLE SURGERY Right    13 surgeries  .  FLEXIBLE BRONCHOSCOPY N/A 07/01/2016   Procedure: FLEXIBLE BRONCHOSCOPY;  Surgeon: Wilhelmina Mcardle, MD;  Location: [facility] ORS;  Service: Pulmonary;  Laterality: N/A;  . FLEXIBLE BRONCHOSCOPY N/A 02/09/2017   Procedure: FLEXIBLE BRONCHOSCOPY;  Surgeon: [name], MD;  Location: [facility] ORS;  Service: Pulmonary;  Laterality: N/A;  . KYPHOPLASTY N/A 05/22/2017   Procedure: Celine Ahr;  Surgeon: Hessie Knows, MD;  Location: [facility] ORS;  Service: Orthopedics;  Laterality: N/A;  . PORTA CATH INSERTION N/A 03/04/2017   Procedure: PORTA CATH INSERTION;  Surgeon: [name], MD;  Location: [place] CV LAB;  Service: Cardiovascular;  Laterality: N/A;  . THUMB ARTHROSCOPY Left        History of present illness and  Hospital Course:     Kindly see H&P for history of present illness and admission details, please review complete Labs, Consult reports and Test reports for all details in brief  HPI  from the history and physical done on the day of admission 77 year old female patient with history of recent kyphoplasty for back pain, breast cancer on left breast, chronic UTIs, COPD on 2-1/2 L of oxygen all the time, fibromyalgia, hypertension admitted for altered mental status, acute on chronic respiratory failure with hypoxia due to left-sided pneumonia   Hospital Course  #1. acute on chronic respiratory failure with hypoxia secondary to healthcare associated pneumonia: Possible postobstructive pneumonia l in the patient with metastatic left lung cancer; patient initially required 8 L of oxygen but now she is on 4 L of oxygen, patient received IV vancomycin, cefepime.  Patient chest x-ray showed small  to moderate left pleural effusion with basilar airspace disease worsened since previous exam could represent pneumonia, postobstructive pneumonitis, atelectasis in a patient with lung cancer.  Patient respiratory status improved with antibiotics, steroids, bronchodilators.  Patient is eager to  go home and she says she is a started to have oxygen sometimes.  So use higher amount of oxygen for couple of days and then wean slowly.  Patient will be discharged with Levaquin.  Patient allergic to penicillins, doxycycline.   2.  Acute encephalopathy secondary to infection: Blood cultures, are negative, UA did not show any bacteria.  Patient was confused when she came.  CAT scan of the head which did not show acute changes, patient was kept n.p.o., started on IV fluids following day patient became very alert and oriented and her encephalopathy resolved.  Because of her presentation patient Klonopin, Ativan for held but she is alert, oriented, resume them at discharge.  3. nonsevere malnutrition secondary to chronic illness, seen by registered dietitian, started on chocolate milk, Magic cups patient has poor appetite since he is diagnosed with lung cancer beginning of this year.  Use nausea medicines as needed.  #4 .sepsis documented on admitting diagnosis but lactic acid is not elevated and patient has no white count.  Patient likely had source on admission.  5.  Recent bacteremia with recurrent UTI: Patient received IV antibiotics for history of ESBL UTI.  6.   urine retention: Patient required in and out cath in the hospital that resolved and patient is able to void, patient is on Myrbetriq at home we restarted that.   #7. metastatic, cell carcinoma of the left lung followed by Dr. Mike Gip, patient still has back pain and rib pain issues, patient is on fentanyl patch, oxycodone, because of back pain she had a kyphoplasty.  Follow-up with pain management for possible intercostal nerve block as per Dr. Mike Gip note.  Discharge Condition: Stable   Follow UP  Follow-up Information    Tracie Harrier, MD. Schedule an appointment as soon as possible for a visit in 1 week(s).   Specialty:  Internal Medicine Contact information: [place]  [id number] [phone]             Discharge Instructions  and  Discharge Medications      Allergies as of 07/20/2017      Reactions   Contrast Media [iodinated Diagnostic Agents] Shortness Of Breath   Acute onset of shortness of breath following CT contrast administration 06/18/17. Sent to the ED. Dr. Kerman Passey documented incident as an allergic reaction. Needs premedications prior to future contrast media injections. Previous questionable contrast allergy, so the patient was given entire or partial premedications without complications. She was not given any premedications on 06/18/17, resulting in shortness of breath and an ED visit.   Sulfa Antibiotics Other (See Comments)   Unknown   Tetracycline Hives   White Petrolatum Other (See Comments)   Blisters   Amoxicillin-pot Clavulanate Rash, Other (See Comments)   Blisters in mouth Has patient had a PCN reaction causing immediate rash, facial/tongue/throat swelling, SOB or lightheadedness with hypotension: No Has patient had a PCN reaction causing severe rash involving mucus membranes or skin necrosis: No Has patient had a PCN reaction that required hospitalization: No Has patient had a PCN reaction occurring within the last 10 years: No If all of the above answers are "NO", then may proceed with Cephalosporin use.   Tape Rash      Medication List  STOP taking these medications   carvedilol 12.5 MG tablet Commonly known as:  COREG   lidocaine-prilocaine cream Commonly known as:  EMLA   mirabegron ER 25 MG Tb24 tablet Commonly known as:  MYRBETRIQ   oxyCODONE 5 MG immediate release tablet Commonly known as:  Oxy IR/ROXICODONE   traMADol 50 MG tablet Commonly known as:  ULTRAM     TAKE these medications   albuterol 108 (90 Base) MCG/ACT inhaler Commonly known as:  PROVENTIL HFA;VENTOLIN HFA Inhale 1-2 puffs into the lungs every 6 (six) hours as needed for wheezing or shortness of breath. What changed:  how much to  take   clonazePAM 1 MG tablet Commonly known as:  KLONOPIN Take 1 mg by mouth daily as needed for anxiety.   fentaNYL 50 MCG/HR Commonly known as:  DURAGESIC - dosed mcg/hr Place 1 patch (50 mcg total) onto the skin every 3 (three) days.   gabapentin 600 MG tablet Commonly known as:  NEURONTIN Take 600 mg by mouth 3 (three) times daily.   HYDROcodone-acetaminophen 5-325 MG tablet Commonly known as:  NORCO/VICODIN Take 1 tablet by mouth 2 (two) times daily as needed for pain.   levofloxacin 500 MG tablet Commonly known as:  LEVAQUIN Take 1 tablet (500 mg total) by mouth daily for 10 days.   LORazepam 0.5 MG tablet Commonly known as:  ATIVAN Take 1 tablet (0.5 mg total) by mouth every 6 (six) hours as needed (Nausea or vomiting).   NITROSTAT 0.4 MG SL tablet Generic drug:  nitroGLYCERIN Place 0.4 mg under the tongue every 5 (five) minutes as needed for chest pain.   ondansetron 8 MG tablet Commonly known as:  ZOFRAN Take 1 tablet (8 mg total) by mouth 2 (two) times daily as needed. Start on the third day after chemotherapy. What changed:    reasons to take this  additional instructions   zolpidem 5 MG tablet Commonly known as:  AMBIEN Take 5 mg by mouth at bedtime as needed for sleep.         Diet and Activity recommendation: See Discharge Instructions above   Consults obtained -dietitian   Major procedures and Radiology Reports - PLEASE review detailed and final reports for all details, in brief -      Ct Head Wo Contrast  Result Date: 07/17/2017 CLINICAL DATA:  Increased lethargy today. EXAM: CT HEAD WITHOUT CONTRAST TECHNIQUE: Contiguous axial images were obtained from the base of the skull through the vertex without intravenous contrast. COMPARISON:  Head CT scan 06/30/2017 and 12/11/2016. Brain MRI 11/26/2015. FINDINGS: Brain: No evidence of acute infarction, hemorrhage, hydrocephalus, extra-axial collection or mass lesion/mass effect. There is cortical  atrophy and some chronic microvascular ischemic change. Vascular: Atherosclerosis noted. Skull: Intact. Sinuses/Orbits: Small mucous retention cyst or polyp anterior right maxillary sinus noted. The patient is status post bilateral maxillary antrostomy. Other: None. IMPRESSION: No acute intracranial abnormality. Cortical atrophy and chronic microvascular ischemic change. Atherosclerosis. Electronically Signed   By: Inge Rise M.D.   On: 07/17/2017 16:17   Ct Head Wo Contrast  Result Date: 06/30/2017 CLINICAL DATA:  Altered level of consciousness. EXAM: CT HEAD WITHOUT CONTRAST TECHNIQUE: Contiguous axial images were obtained from the base of the skull through the vertex without intravenous contrast. COMPARISON:  12/11/2016 FINDINGS: Brain: Diffuse cerebral atrophy, most notable over the frontal lobes. No acute intracranial abnormality. Specifically, no hemorrhage, hydrocephalus, mass lesion, acute infarction, or significant intracranial injury. Vascular: No hyperdense vessel or unexpected calcification. Skull: No acute calvarial abnormality.  Sinuses/Orbits: Postoperative changes in the right paranasal sinuses. No acute findings. Other: None IMPRESSION: No acute intracranial abnormality. Electronically Signed   By: Rolm Baptise M.D.   On: 06/30/2017 09:49   US Renal  Result Date: 06/24/2017 CLINICAL DATA:  Recurrent UTIs EXAM: RENAL / URINARY TRACT ULTRASOUND COMPLETE COMPARISON:  None. FINDINGS: Right Kidney: Length: 9.6 cm, cortical thinning. Echogenicity within normal limits. No mass or hydronephrosis visualized. Left Kidney: Length: 9.1 cm, cortical thinning. Echogenicity within normal limits. No mass or hydronephrosis visualized. Bladder: Appears normal for degree of bladder distention. IMPRESSION: Cortical thinning bilaterally.  No hydronephrosis or acute findings. Electronically Signed   By: Rolm Baptise M.D.   On: 06/24/2017 08:13   Dg Chest Portable 1 View  Result Date: 07/17/2017 CLINICAL  DATA:  Altered mental status today. Increased lethargy. History of left lower lobe lung carcinoma. EXAM: PORTABLE CHEST 1 VIEW COMPARISON:  CT chest 06/18/2017.  PA and lateral chest 04/16/2017. FINDINGS: The lungs are emphysematous. Left pleural effusion and basilar airspace disease have increased since the most recent exam. The right lung is clear. No pneumothorax. Port-A-Cath is in place. Atherosclerosis is identified. Surgical clips left axilla noted. No acute bony abnormality. IMPRESSION: Small to moderate left pleural effusion and basilar airspace disease have worsened since the most recent examination could pneumonia, postobstructive pneumonitis or atelectasis in this patient with a history of lung carcinoma. Atherosclerosis. Emphysema. Electronically Signed   By: Inge Rise M.D.   On: 07/17/2017 13:11    Micro Results     Recent Results (from the past 240 hour(s))  Culture, blood (Routine x 2)     Status: None (Preliminary result)   Collection Time: 07/17/17 12:50 PM  Result Value Ref Range Status   Specimen Description BLOOD LEFT WRIST  Final   Special Requests   Final    BOTTLES DRAWN AEROBIC AND ANAEROBIC Blood Culture results may not be optimal due to an inadequate volume of blood received in culture bottles   Culture   Final    NO GROWTH 3 DAYS Performed at [facility], [address] [place], [place] [address]    Report Status PENDING  Incomplete  Culture, blood (Routine x 2)     Status: None (Preliminary result)   Collection Time: 07/17/17 12:51 PM  Result Value Ref Range Status   Specimen Description BLOOD A-LINE DRAW  Final   Special Requests   Final    BOTTLES DRAWN AEROBIC AND ANAEROBIC Blood Culture adequate volume   Culture   Final    NO GROWTH 3 DAYS Performed at [facility], [address], [place], [place] [address]    Report Status PENDING  Incomplete  MRSA PCR Screening     Status: None   Collection Time: 07/17/17  7:29 PM  Result  Value Ref Range Status   MRSA by PCR NEGATIVE NEGATIVE Final    Comment:        The GeneXpert MRSA Assay (FDA approved for NASAL specimens only), is one component of a comprehensive MRSA colonization surveillance program. It is not intended to diagnose MRSA infection nor to guide or monitor treatment for MRSA infections. Performed at [facility], [address] [place], [place] [address]        Today   Subjective:   [name] [name] today has no headache,no chest abdominal pain,no new weakness tingling or numbness, feels much better wants to go home today.  Objective:   Blood pressure 130/81, pulse 97, temperature 98.6  F (37 C), temperature source Oral, resp. rate 18, height _0  (1.549 m), weight 54.1 kg (119 lb 4.3 oz), SpO2 93 %.   Intake/Output Summary (Last 24 hours) at 07/20/2017 0832 Last data filed at 07/19/2017 2319 Gross per 24 hour  Intake 1165.83 ml  Output -  Net 1165.83 ml    Exam Awake Alert, Oriented x 3, No new F.N deficits, Normal affect [place].AT,PERRAL Supple Neck,No JVD, No cervical lymphadenopathy appriciated.  Symmetrical Chest wall movement, Good air movement bilaterally, CTAB RRR,No Gallops,Rubs or new Murmurs, No Parasternal Heave +ve B.Sounds, Abd Soft, Non tender, No organomegaly appriciated, No rebound -guarding or rigidity. No Cyanosis, Clubbing or edema, No new Rash or bruise  Data Review   CBC w Diff:  Lab Results  Component Value Date   WBC 8.0 07/18/2017   HGB 9.5 (L) 07/18/2017   HGB 14.0 03/09/2013   HCT 28.4 (L) 07/18/2017   HCT 42.4 03/09/2013   PLT 167 07/18/2017   PLT 165 03/09/2013   LYMPHOPCT 7 07/17/2017   LYMPHOPCT 30.1 03/09/2013   MONOPCT 8 07/17/2017   MONOPCT 6.1 03/09/2013   EOSPCT 2 07/17/2017   EOSPCT 2.5 03/09/2013   BASOPCT 1 07/17/2017   BASOPCT 1.2 03/09/2013    CMP:  Lab Results  Component Value Date   NA 139 07/18/2017   NA 135 (L) 03/09/2013   K 4.6 07/18/2017   K 3.9 03/09/2013    CL 105 07/18/2017   CL 97 (L) 03/09/2013   CO2 26 07/18/2017   CO2 29 03/09/2013   BUN 16 07/18/2017   BUN 7 03/09/2013   CREATININE 1.05 (H) 07/18/2017   CREATININE 1.03 03/09/2013   PROT 6.9 07/17/2017   PROT 8.0 03/09/2013   ALBUMIN 2.9 (L) 07/17/2017   ALBUMIN 3.8 03/09/2013   BILITOT 0.9 07/17/2017   BILITOT 0.3 03/09/2013   ALKPHOS 137 (H) 07/17/2017   ALKPHOS 125 (H) 03/09/2013   AST 19 07/17/2017   AST 19 03/09/2013   ALT 10 07/17/2017   ALT 18 03/09/2013  .   Total Time in preparing paper work, data evaluation and todays exam - 35 minutes  Epifanio Lesches M.D on 07/20/2017 at 8:32 AM    Note: This dictation was prepared with Dragon dictation along with smaller phrase technology. Any transcriptional errors that result from this process are unintentional.

## 2017-07-20 NOTE — Care Management Note (Signed)
Case Management Note  Patient Details  Name: [name] [name] MRN: [id number] Date of Birth: 09-10-1940  Subjective/Objective:    Patient to be discharged per MD order. [facility]. Patient would like to [facility] services with [facility]. Patient also receives chronic oxygen from [facility]. Referral placed with Corene Cornea from [facility] and resumption of home health services order was placed. Patient requires no other needs. Family at bedside to bring portable tank and provide transport. [name] [name] RN BSN RNCM [phone]   Action/Plan:   Expected Discharge Date:  07/20/17               Expected Discharge Plan:     In-House Referral:     Discharge planning Services  CM Consult  Post Acute Care Choice:  Resumption of Svcs/PTA Provider, Home Health Choice offered to:  Patient  DME Arranged:    DME Agency:     HH Arranged:  RN [place] Agency:  [place]  Status of Service:  Completed, signed off  If discussed at [place] of Stay Meetings, dates discussed:    Additional Comments:  [name] A [name], RN 07/20/2017, 11:01 AM

## 2017-07-20 NOTE — Progress Notes (Signed)
Pt complaining of head ache, 10/10.  No tylenol ordered and pt can only have norco 2x day.  Received order for tylenol from Dr Manuella Ghazi but pt refuses to take at this time. Dorna Bloom RN

## 2017-07-20 NOTE — Care Management Important Message (Signed)
Important Message  Patient Details  Name: [name] [name] MRN: [id number] Date of Birth: [birth year]   Medicare Important Message Given:  Yes    Aviannah Castoro A Saraann Enneking, RN 07/20/2017, 10:31 AM

## 2017-07-21 DIAGNOSIS — B9620 Unspecified Escherichia coli [E. coli] as the cause of diseases classified elsewhere: Secondary | ICD-10-CM | POA: Diagnosis not present

## 2017-07-21 DIAGNOSIS — I5022 Chronic systolic (congestive) heart failure: Secondary | ICD-10-CM | POA: Diagnosis not present

## 2017-07-21 DIAGNOSIS — N390 Urinary tract infection, site not specified: Secondary | ICD-10-CM | POA: Diagnosis not present

## 2017-07-21 DIAGNOSIS — J9612 Chronic respiratory failure with hypercapnia: Secondary | ICD-10-CM | POA: Diagnosis not present

## 2017-07-21 DIAGNOSIS — J449 Chronic obstructive pulmonary disease, unspecified: Secondary | ICD-10-CM | POA: Diagnosis not present

## 2017-07-21 DIAGNOSIS — Z452 Encounter for adjustment and management of vascular access device: Secondary | ICD-10-CM | POA: Diagnosis not present

## 2017-07-21 DIAGNOSIS — J45909 Unspecified asthma, uncomplicated: Secondary | ICD-10-CM | POA: Diagnosis not present

## 2017-07-21 DIAGNOSIS — C3432 Malignant neoplasm of lower lobe, left bronchus or lung: Secondary | ICD-10-CM | POA: Diagnosis not present

## 2017-07-21 DIAGNOSIS — I110 Hypertensive heart disease with heart failure: Secondary | ICD-10-CM | POA: Diagnosis not present

## 2017-07-22 ENCOUNTER — Encounter: Payer: Self-pay | Admitting: Urology

## 2017-07-22 ENCOUNTER — Other Ambulatory Visit: Payer: Medicare HMO | Admitting: Urology

## 2017-07-22 DIAGNOSIS — I5022 Chronic systolic (congestive) heart failure: Secondary | ICD-10-CM | POA: Diagnosis not present

## 2017-07-22 DIAGNOSIS — Z452 Encounter for adjustment and management of vascular access device: Secondary | ICD-10-CM | POA: Diagnosis not present

## 2017-07-22 DIAGNOSIS — N390 Urinary tract infection, site not specified: Secondary | ICD-10-CM | POA: Diagnosis not present

## 2017-07-22 DIAGNOSIS — J9612 Chronic respiratory failure with hypercapnia: Secondary | ICD-10-CM | POA: Diagnosis not present

## 2017-07-22 DIAGNOSIS — C3432 Malignant neoplasm of lower lobe, left bronchus or lung: Secondary | ICD-10-CM | POA: Diagnosis not present

## 2017-07-22 DIAGNOSIS — J45909 Unspecified asthma, uncomplicated: Secondary | ICD-10-CM | POA: Diagnosis not present

## 2017-07-22 DIAGNOSIS — B9620 Unspecified Escherichia coli [E. coli] as the cause of diseases classified elsewhere: Secondary | ICD-10-CM | POA: Diagnosis not present

## 2017-07-22 DIAGNOSIS — I110 Hypertensive heart disease with heart failure: Secondary | ICD-10-CM | POA: Diagnosis not present

## 2017-07-22 DIAGNOSIS — J449 Chronic obstructive pulmonary disease, unspecified: Secondary | ICD-10-CM | POA: Diagnosis not present

## 2017-07-22 LAB — CULTURE, BLOOD (ROUTINE X 2)
CULTURE: NO GROWTH
Culture: NO GROWTH
Special Requests: ADEQUATE

## 2017-07-23 ENCOUNTER — Ambulatory Visit: Payer: Medicare HMO

## 2017-07-27 DIAGNOSIS — J45909 Unspecified asthma, uncomplicated: Secondary | ICD-10-CM | POA: Diagnosis not present

## 2017-07-27 DIAGNOSIS — I5022 Chronic systolic (congestive) heart failure: Secondary | ICD-10-CM | POA: Diagnosis not present

## 2017-07-27 DIAGNOSIS — J9612 Chronic respiratory failure with hypercapnia: Secondary | ICD-10-CM | POA: Diagnosis not present

## 2017-07-27 DIAGNOSIS — I110 Hypertensive heart disease with heart failure: Secondary | ICD-10-CM | POA: Diagnosis not present

## 2017-07-27 DIAGNOSIS — B9620 Unspecified Escherichia coli [E. coli] as the cause of diseases classified elsewhere: Secondary | ICD-10-CM | POA: Diagnosis not present

## 2017-07-27 DIAGNOSIS — Z452 Encounter for adjustment and management of vascular access device: Secondary | ICD-10-CM | POA: Diagnosis not present

## 2017-07-27 DIAGNOSIS — C3432 Malignant neoplasm of lower lobe, left bronchus or lung: Secondary | ICD-10-CM | POA: Diagnosis not present

## 2017-07-27 DIAGNOSIS — J449 Chronic obstructive pulmonary disease, unspecified: Secondary | ICD-10-CM | POA: Diagnosis not present

## 2017-07-27 DIAGNOSIS — N390 Urinary tract infection, site not specified: Secondary | ICD-10-CM | POA: Diagnosis not present

## 2017-07-28 ENCOUNTER — Ambulatory Visit: Payer: Medicare HMO

## 2017-07-30 ENCOUNTER — Inpatient Hospital Stay: Payer: Medicare HMO | Attending: Hematology and Oncology

## 2017-08-03 ENCOUNTER — Telehealth: Payer: Self-pay | Admitting: *Deleted

## 2017-08-03 ENCOUNTER — Other Ambulatory Visit: Payer: Self-pay | Admitting: *Deleted

## 2017-08-03 DIAGNOSIS — Z452 Encounter for adjustment and management of vascular access device: Secondary | ICD-10-CM | POA: Diagnosis not present

## 2017-08-03 DIAGNOSIS — C3432 Malignant neoplasm of lower lobe, left bronchus or lung: Secondary | ICD-10-CM

## 2017-08-03 DIAGNOSIS — B9620 Unspecified Escherichia coli [E. coli] as the cause of diseases classified elsewhere: Secondary | ICD-10-CM | POA: Diagnosis not present

## 2017-08-03 DIAGNOSIS — I110 Hypertensive heart disease with heart failure: Secondary | ICD-10-CM | POA: Diagnosis not present

## 2017-08-03 DIAGNOSIS — J9612 Chronic respiratory failure with hypercapnia: Secondary | ICD-10-CM | POA: Diagnosis not present

## 2017-08-03 DIAGNOSIS — I5022 Chronic systolic (congestive) heart failure: Secondary | ICD-10-CM | POA: Diagnosis not present

## 2017-08-03 DIAGNOSIS — J449 Chronic obstructive pulmonary disease, unspecified: Secondary | ICD-10-CM | POA: Diagnosis not present

## 2017-08-03 DIAGNOSIS — J45909 Unspecified asthma, uncomplicated: Secondary | ICD-10-CM | POA: Diagnosis not present

## 2017-08-03 DIAGNOSIS — N390 Urinary tract infection, site not specified: Secondary | ICD-10-CM | POA: Diagnosis not present

## 2017-08-03 NOTE — Telephone Encounter (Signed)
Called patient to inquire how she is doing since her recent discharge from the hospital.  Patient responded, "Not so good.  I am in and out of the hospital.  I still have a lot of congestion".  I asked her if she could come in this week for hospital follow up to see Dr. Mike Gip.  Patient states she can.  I have sent scheduling a message to add patient to Thursday's schedule for lab/MD.

## 2017-08-03 NOTE — Telephone Encounter (Signed)
-----   Message from Karen Kitchens, NP sent at 08/01/2017  4:06 PM EDT ----- Regarding: Follow up We need to follow up with her and find out how she is doing. Also, would consider getting her in this week for a hospital follow up if possible.   Gaspar Bidding

## 2017-08-03 NOTE — Telephone Encounter (Signed)
Called patient and spoke to friend.  Patient is sleeping, so I told the friend I would call back.  Did not give my name or where I was calling from.

## 2017-08-04 DIAGNOSIS — J449 Chronic obstructive pulmonary disease, unspecified: Secondary | ICD-10-CM | POA: Diagnosis not present

## 2017-08-04 DIAGNOSIS — R0902 Hypoxemia: Secondary | ICD-10-CM | POA: Diagnosis not present

## 2017-08-06 ENCOUNTER — Inpatient Hospital Stay (HOSPITAL_BASED_OUTPATIENT_CLINIC_OR_DEPARTMENT_OTHER): Payer: Medicare HMO | Admitting: Hematology and Oncology

## 2017-08-06 ENCOUNTER — Inpatient Hospital Stay: Payer: Medicare HMO | Attending: Hematology and Oncology

## 2017-08-06 ENCOUNTER — Other Ambulatory Visit: Payer: Self-pay

## 2017-08-06 ENCOUNTER — Other Ambulatory Visit: Payer: Self-pay | Admitting: Hematology and Oncology

## 2017-08-06 ENCOUNTER — Inpatient Hospital Stay: Payer: Medicare HMO

## 2017-08-06 VITALS — BP 126/73 | HR 76 | Temp 94.6°F | Resp 18 | Wt 108.1 lb

## 2017-08-06 DIAGNOSIS — Z7189 Other specified counseling: Secondary | ICD-10-CM

## 2017-08-06 DIAGNOSIS — E538 Deficiency of other specified B group vitamins: Secondary | ICD-10-CM | POA: Diagnosis not present

## 2017-08-06 DIAGNOSIS — C3432 Malignant neoplasm of lower lobe, left bronchus or lung: Secondary | ICD-10-CM

## 2017-08-06 DIAGNOSIS — R5383 Other fatigue: Secondary | ICD-10-CM | POA: Diagnosis not present

## 2017-08-06 DIAGNOSIS — R0781 Pleurodynia: Secondary | ICD-10-CM | POA: Diagnosis not present

## 2017-08-06 DIAGNOSIS — J449 Chronic obstructive pulmonary disease, unspecified: Secondary | ICD-10-CM

## 2017-08-06 DIAGNOSIS — R634 Abnormal weight loss: Secondary | ICD-10-CM

## 2017-08-06 DIAGNOSIS — Z87440 Personal history of urinary (tract) infections: Secondary | ICD-10-CM

## 2017-08-06 LAB — COMPREHENSIVE METABOLIC PANEL WITH GFR
ALT: 10 U/L (ref 0–44)
AST: 17 U/L (ref 15–41)
Albumin: 3.2 g/dL — ABNORMAL LOW (ref 3.5–5.0)
Alkaline Phosphatase: 143 U/L — ABNORMAL HIGH (ref 38–126)
Anion gap: 13 (ref 5–15)
BUN: 12 mg/dL (ref 8–23)
CO2: 30 mmol/L (ref 22–32)
Calcium: 9 mg/dL (ref 8.9–10.3)
Chloride: 90 mmol/L — ABNORMAL LOW (ref 98–111)
Creatinine, Ser: 1.07 mg/dL — ABNORMAL HIGH (ref 0.44–1.00)
GFR calc Af Amer: 58 mL/min — ABNORMAL LOW
GFR calc non Af Amer: 50 mL/min — ABNORMAL LOW
Glucose, Bld: 120 mg/dL — ABNORMAL HIGH (ref 70–99)
Potassium: 4.1 mmol/L (ref 3.5–5.1)
Sodium: 133 mmol/L — ABNORMAL LOW (ref 135–145)
Total Bilirubin: 0.5 mg/dL (ref 0.3–1.2)
Total Protein: 6.8 g/dL (ref 6.5–8.1)

## 2017-08-06 LAB — CBC WITH DIFFERENTIAL/PLATELET
Basophils Absolute: 0 10*3/uL (ref 0–0.1)
Basophils Relative: 1 %
EOS PCT: 2 %
Eosinophils Absolute: 0.2 10*3/uL (ref 0–0.7)
HCT: 32.4 % — ABNORMAL LOW (ref 35.0–47.0)
Hemoglobin: 10.7 g/dL — ABNORMAL LOW (ref 12.0–16.0)
LYMPHS PCT: 10 %
Lymphs Abs: 0.7 10*3/uL — ABNORMAL LOW (ref 1.0–3.6)
MCH: 30.7 pg (ref 26.0–34.0)
MCHC: 33.1 g/dL (ref 32.0–36.0)
MCV: 92.8 fL (ref 80.0–100.0)
MONO ABS: 0.5 10*3/uL (ref 0.2–0.9)
Monocytes Relative: 7 %
Neutro Abs: 5.3 10*3/uL (ref 1.4–6.5)
Neutrophils Relative %: 80 %
Platelets: 121 10*3/uL — ABNORMAL LOW (ref 150–440)
RBC: 3.49 MIL/uL — ABNORMAL LOW (ref 3.80–5.20)
RDW: 16.7 % — AB (ref 11.5–14.5)
WBC: 6.6 10*3/uL (ref 3.6–11.0)

## 2017-08-06 LAB — TSH: TSH: 1.603 u[IU]/mL (ref 0.350–4.500)

## 2017-08-06 LAB — MAGNESIUM: Magnesium: 2 mg/dL (ref 1.7–2.4)

## 2017-08-06 LAB — T4, FREE: Free T4: 1.46 ng/dL (ref 0.82–1.77)

## 2017-08-06 MED ORDER — CYANOCOBALAMIN 1000 MCG/ML IJ SOLN
1000.0000 ug | Freq: Once | INTRAMUSCULAR | Status: DC
  Filled 2017-08-06: qty 1

## 2017-08-06 NOTE — Progress Notes (Signed)
Patient here today as hospital follow up. 

## 2017-08-06 NOTE — Progress Notes (Signed)
Patient was no on schedule for B12 injection today so was given as an add on.

## 2017-08-06 NOTE — Progress Notes (Signed)
[facility] day:  08/06/2017  Chief Complaint: [name] [name] is a 77 y.o. female with presumed metastatic squamous cell carcinoma of the left lower lobe who is seen for 1 month assessment.   HPI:  The patient was last seen in the medical oncology clinic on 07/09/2017.  At that time, she was fatigued.  She had ongoing back/rib pain.  She was on IV antibiotics. She was on oxygen 3 liters/min via [place]. Alkaline phosphatase was elevated.  Chest CT was stable.  She was scheduled to be seen by orthopedics to assess her back and rib pain.  She was scheduled for cystoscopy on 07/22/2017.  She was seen by Emmit Alexanders, PA on 07/09/2017.  She was felt to have a compression fracture causing severe pain.  Plain films revealed concern for mild inferior endplate compression deformity along T5, T6.  She was scheduled for thoracic spine MRI (07/28/2017). Due to acute illness, patient has not had imaging performed.   Patient was admitted to [facility] from 07/17/2017 - 07/20/2017 with chronic respiratory failure with hypoxia, health care associated pneumonia, and sepsis.  She presented with altered mental status, acute on chronic respiratory failure with hypoxia due to left-sided pneumonia.  She initially required 8 L of oxygen but weaned to 4 L of oxygen.  She received IV vancomycin, cefepime.  Patient chest x-ray showed small to moderate left pleural effusion with basilar airspace disease worsened since previous exam could represent pneumonia, postobstructive pneumonitis, atelectasis in a patient with lung cancer.  Her respiratory status improved with antibiotics, steroids, bronchodilators.  She was discharged with Levaquin.    Her acute encephalopathy was felt secondary to infection.  Blood cultures, are negative, UA did not show any bacteria.  Head CT without contrast did not show acute changes.  Klonopin and Ativan were held but resumed at discharge.  She was seen  by registered dietitian and started on chocolate milk, Magic cups patient has poor appetite since he is diagnosed with lung cancer beginning of this year.  Use nausea medicines as needed.  She required in and out cath in the hospital that resolved and patient is able to void.  She is on Myrbetriq at home.  During the interim, patient patient has been having a "hard time" with her breathing. She has had to increase her oxygen to 4L/[place] around the clock. Patient notes that her back and rib pain has improved and wants to hold off on further intervention.   Patient spends the majority of her day sitting in her recliner. She notes that she does not feel like doing anything. Patient has no energy. She is falling around. Multiple areas of bruising noted to head, arms, torso, and hips. Patient is requiring more assistance with her activities of daily living.   Patient has a poor appetite overall. Her weight remains stable. Patient is not willing to consider the use of appetite appetite stimulant medications.   Patient complains of chronic pain rated 6/10 in the clinic today.    Past Medical History:  Diagnosis Date  . Asthma   . Breast cancer ([place]) 2009   left  . Cancer ([place])   . CHF (congestive heart failure) ([place])   . CHF (congestive heart failure) ([place])   . Chronic UTI   . COPD (chronic obstructive pulmonary disease) ([place])   . Dizziness   . Fibromyalgia   . Hypertension   . Neuropathy   . Personal history of tobacco use, presenting  hazards to health 05/17/2015  . Polyp, larynx   . RA (rheumatoid arthritis) ([place])   . Sinus infection    recent  . Stumbling gait    to the left  . Supplemental oxygen dependent    2.5l    Past Surgical History:  Procedure Laterality Date  . ABDOMINAL HYSTERECTOMY    . BREAST LUMPECTOMY Left 2009   chemo and radiation  . CYST EXCISION Left 02/27/2015   Procedure: CYST REMOVAL;  Surgeon: Hessie Knows, MD;  Location: [facility] ORS;  Service: Orthopedics;   Laterality: Left;  . EYE MUSCLE SURGERY Right    13 surgeries  . FLEXIBLE BRONCHOSCOPY N/A 07/01/2016   Procedure: FLEXIBLE BRONCHOSCOPY;  Surgeon: Wilhelmina Mcardle, MD;  Location: [facility] ORS;  Service: Pulmonary;  Laterality: N/A;  . FLEXIBLE BRONCHOSCOPY N/A 02/09/2017   Procedure: FLEXIBLE BRONCHOSCOPY;  Surgeon: [name], MD;  Location: [facility] ORS;  Service: Pulmonary;  Laterality: N/A;  . KYPHOPLASTY N/A 05/22/2017   Procedure: Celine Ahr;  Surgeon: Hessie Knows, MD;  Location: [facility] ORS;  Service: Orthopedics;  Laterality: N/A;  . PORTA CATH INSERTION N/A 03/04/2017   Procedure: PORTA CATH INSERTION;  Surgeon: [name], MD;  Location: [facility];  Service: Cardiovascular;  Laterality: N/A;  . THUMB ARTHROSCOPY Left     Family History  Problem Relation Age of Onset  . Diabetes Father   . Stroke Father   . Heart attack Father   . CAD Sister     Social History:  reports that she has been smoking cigarettes.  She has a 40.00 pack-year smoking history. She has never used smokeless tobacco. She reports that she does not drink alcohol or use drugs.  He lives with her son and grandson. Her grandson is in the waiting room.  The patient is accompanied by her friend, [name] [name], today.   Allergies:  Allergies  Allergen Reactions  . Contrast Media [Iodinated Diagnostic Agents] Shortness Of Breath    Acute onset of shortness of breath following CT contrast administration 06/18/17. Sent to the ED. Dr. Kerman Passey documented incident as an allergic reaction. Needs premedications prior to future contrast media injections. Previous questionable contrast allergy, so the patient was given entire or partial premedications without complications. She was not given any premedications on 06/18/17, resulting in shortness of breath and an ED visit.  . Sulfa Antibiotics Other (See Comments)    Unknown  . Tetracycline Hives  . White Petrolatum Other (See Comments)    Blisters  .  Amoxicillin-Pot Clavulanate Rash and Other (See Comments)    Blisters in mouth Has patient had a PCN reaction causing immediate rash, facial/tongue/throat swelling, SOB or lightheadedness with hypotension: No Has patient had a PCN reaction causing severe rash involving mucus membranes or skin necrosis: No Has patient had a PCN reaction that required hospitalization: No Has patient had a PCN reaction occurring within the last 10 years: No If all of the above answers are "NO", then may proceed with Cephalosporin use.   . Tape Rash    Current Medications: Current Outpatient Medications  Medication Sig Dispense Refill  . albuterol (PROVENTIL HFA;VENTOLIN HFA) 108 (90 Base) MCG/ACT inhaler Inhale 1-2 puffs into the lungs every 6 (six) hours as needed for wheezing or shortness of breath. (Patient taking differently: Inhale 2 puffs into the lungs every 6 (six) hours as needed for wheezing or shortness of breath. ) 1 Inhaler 2  . clonazePAM (KLONOPIN) 1 MG tablet Take 1 mg by mouth daily as  needed for anxiety.     . fentaNYL (DURAGESIC - DOSED MCG/HR) 50 MCG/HR Place 1 patch (50 mcg total) onto the skin every 3 (three) days. 5 patch 0  . gabapentin (NEURONTIN) 600 MG tablet Take 600 mg by mouth 3 (three) times daily.     [name] LORazepam (ATIVAN) 0.5 MG tablet Take 1 tablet (0.5 mg total) by mouth every 6 (six) hours as needed (Nausea or vomiting). 30 tablet 0  . nitroGLYCERIN (NITROSTAT) 0.4 MG SL tablet Place 0.4 mg under the tongue every 5 (five) minutes as needed for chest pain.     [name] ondansetron (ZOFRAN) 8 MG tablet Take 1 tablet (8 mg total) by mouth 2 (two) times daily as needed. Start on the third day after chemotherapy. (Patient taking differently: Take 8 mg by mouth 2 (two) times daily as needed for nausea or vomiting. Start on the third day after chemotherapy.) 30 tablet 1  . predniSONE (STERAPRED UNI-PAK 21 TAB) 10 MG (21) TBPK tablet Taper by 10 mg p.o. daily 21 tablet 0  . zolpidem (AMBIEN) 5  MG tablet Take 5 mg by mouth at bedtime as needed for sleep.     [name] HYDROcodone-acetaminophen (NORCO/VICODIN) 5-325 MG tablet Take 1 tablet by mouth 2 (two) times daily as needed. 30 tablet 0   No current facility-administered medications for this visit.    Facility-Administered Medications Ordered in Other Visits  Medication Dose Route Frequency Provider Last Rate Last Dose  . cyanocobalamin ((VITAMIN B-12)) injection 1,000 mcg  1,000 mcg Intramuscular Once Honor Loh E, NP      . heparin lock flush 100 unit/mL  500 Units Intravenous Once Lequita Asal, MD        Review of Systems  Constitutional: Positive for malaise/fatigue. Negative for diaphoresis, fever and weight loss.       Progressive decline in performance status. (+) deconditioning and debility. Sits in recliner all day and "rests".  HENT: Negative.   Eyes: Negative.   Respiratory: Positive for cough (chronic) and shortness of breath (related to advanced COPD). Negative for hemoptysis and sputum production.        Requires supplemental oxygen at 4L/[place] ATC.  Cardiovascular: Positive for orthopnea. Negative for chest pain, palpitations, leg swelling and PND.  Gastrointestinal: Negative for abdominal pain, blood in stool, constipation, diarrhea, melena, nausea and vomiting.  Genitourinary: Negative for dysuria, frequency, hematuria and urgency.       Recurrent ESBL producing UTIs  Musculoskeletal: Positive for back pain (chronic) and joint pain (ribs - improved). Negative for falls and myalgias.  Skin: Negative for itching and rash.  Neurological: Positive for weakness (progressive). Negative for dizziness, tremors and headaches.  Endo/Heme/Allergies: Does not bruise/bleed easily.  Psychiatric/Behavioral: Negative for depression, memory loss and suicidal ideas. The patient is not nervous/anxious and does not have insomnia.   All other systems reviewed and are negative.  Performance status (ECOG): 3 - Symptomatic, >50%  confined to bed  Vital Signs BP 126/73 (BP Location: Right Arm, Patient Position: Sitting)   Pulse 76   Temp (!) 94.6 F (34.8 C) (Tympanic)   Resp 18   Wt 108 lb 2 oz (49 kg)   SpO2 96% Comment: Changed O2 to one of our tanks and O2 increased to 96%.  BMI 20.43 kg/m   Physical Exam  Constitutional: She is oriented to person, place, and time and well-developed, well-nourished, and in no distress. She appears lethargic. She appears unhealthy. She appears cachectic.  Sitting in a wheelchair.  HENT:  Head: Normocephalic and atraumatic.  Short gray hair.  Eyes: Pupils are equal, round, and reactive to light. EOM are normal. No scleral icterus.  Blue eyes  Neck: Normal range of motion. Neck supple. No tracheal deviation present. No thyromegaly present.  Cardiovascular: Normal rate, regular rhythm and normal heart sounds. Exam reveals no gallop and no friction rub.  No murmur heard. Pulmonary/Chest: Effort normal and breath sounds normal. No respiratory distress. She has no wheezes. She has no rales.  Supplemental oxygen in place at 4L/[place].  Abdominal: Soft. Bowel sounds are normal. She exhibits no distension. There is no tenderness.  Musculoskeletal: Normal range of motion. She exhibits edema (trace - 1+ lower extremity edema.). She exhibits no tenderness.  Lymphadenopathy:    She has no cervical adenopathy.    She has no axillary adenopathy.       Right: No inguinal and no supraclavicular adenopathy present.       Left: No inguinal and no supraclavicular adenopathy present.  Neurological: She is oriented to person, place, and time. She appears lethargic.  Skin: Skin is warm and dry. No rash noted. No erythema.  Scattered ecchymosis.  Psychiatric: Mood, affect and judgment normal.  Nursing note and vitals reviewed.    Orders Only on 08/06/2017  Component Date Value Ref Range Status  . Free T4 08/06/2017 1.46  0.82 - 1.77 ng/dL Final   Comment: (NOTE) Biotin ingestion may  interfere with free T4 tests. If the results are inconsistent with the TSH level, previous test results, or the clinical presentation, then consider biotin interference. If needed, order repeat testing after stopping biotin. Performed at [facility], [address], [place], [place] [address]   . WBC 08/06/2017 6.6  3.6 - 11.0 K/uL Final  . RBC 08/06/2017 3.49* 3.80 - 5.20 MIL/uL Final  . Hemoglobin 08/06/2017 10.7* 12.0 - 16.0 g/dL Final  . HCT 08/06/2017 32.4* 35.0 - 47.0 % Final  . MCV 08/06/2017 92.8  80.0 - 100.0 fL Final  . MCH 08/06/2017 30.7  26.0 - 34.0 pg Final  . MCHC 08/06/2017 33.1  32.0 - 36.0 g/dL Final  . RDW 08/06/2017 16.7* 11.5 - 14.5 % Final  . Platelets 08/06/2017 121* 150 - 440 K/uL Final  . Neutrophils Relative % 08/06/2017 80  % Final  . Neutro Abs 08/06/2017 5.3  1.4 - 6.5 K/uL Final  . Lymphocytes Relative 08/06/2017 10  % Final  . Lymphs Abs 08/06/2017 0.7* 1.0 - 3.6 K/uL Final  . Monocytes Relative 08/06/2017 7  % Final  . Monocytes Absolute 08/06/2017 0.5  0.2 - 0.9 K/uL Final  . Eosinophils Relative 08/06/2017 2  % Final  . Eosinophils Absolute 08/06/2017 0.2  0 - 0.7 K/uL Final  . Basophils Relative 08/06/2017 1  % Final  . Basophils Absolute 08/06/2017 0.0  0 - 0.1 K/uL Final   Performed at [facility], [address], [place], [place] [address]  . TSH 08/06/2017 1.603  0.350 - 4.500 uIU/mL Final   Comment: Performed by a 3rd Generation assay with a functional sensitivity of <=0.01 uIU/mL. Performed at [facility], [address], [place], [place] [address]   . Magnesium 08/06/2017 2.0  1.7 - 2.4 mg/dL Final   Performed at [facility], [address], [place], [place] [address]  . Sodium 08/06/2017 133* 135 - 145 mmol/L Final  . Potassium 08/06/2017 4.1  3.5 - 5.1 mmol/L Final  . Chloride 08/06/2017 90* 98 - 111 mmol/L  Final  . CO2 08/06/2017 30  22 - 32 mmol/L Final  . Glucose, Bld 08/06/2017 120* 70 - 99  mg/dL Final  . BUN 08/06/2017 12  8 - 23 mg/dL Final  . Creatinine, Ser 08/06/2017 1.07* 0.44 - 1.00 mg/dL Final  . Calcium 08/06/2017 9.0  8.9 - 10.3 mg/dL Final  . Total Protein 08/06/2017 6.8  6.5 - 8.1 g/dL Final  . Albumin 08/06/2017 3.2* 3.5 - 5.0 g/dL Final  . AST 08/06/2017 17  15 - 41 U/L Final  . ALT 08/06/2017 10  0 - 44 U/L Final  . Alkaline Phosphatase 08/06/2017 143* 38 - 126 U/L Final  . Total Bilirubin 08/06/2017 0.5  0.3 - 1.2 mg/dL Final  . GFR calc non Af Amer 08/06/2017 50* >60 mL/min Final  . GFR calc Af Amer 08/06/2017 58* >60 mL/min Final   Comment: (NOTE) The eGFR has been calculated using the CKD EPI equation. This calculation has not been validated in all clinical situations. eGFR's persistently <60 mL/min signify possible Chronic Kidney Disease.   Georgiann Hahn gap 08/06/2017 13  5 - 15 Final   Performed at [facility], [address] [place], [place] [address]    Assessment:  JERONICA STLOUIS is a 77 y.o. female with presumed metastatic squamous cell lung cancer.  She presented with clinical T2bNxM0 squamous cell lung cancer of the left lung s/p bronchoscopy and biopsy on 07/01/2016.  She has a 40 pack year smoking history.  She presented with left lower chest wall pain.  Foundation One testing on 03/13/2017 revealed no reportable alterations in EGFR, RET, ALK, MET, ERBB2, BRAF, and ROS1.    Tumor was MS-stable.  TMB was 3 Muts/Mb.  There was CDKN2A loss, CDKN2B loss, KRAS G12V, MTAP loss, and TP53 R282W.  PD-L1 was 5%.  Chest CT with contrast on 06/11/2016 revealed a 3.2 x 3.0 mass like area of focal opacity in the medial left lower lobe with obliteration of segmental airways to the anterior left lower lobe.  PET scan on 06/27/2016 revealed a 4.3 x 3.2 cm central left lower lobe lung lesion (SUV 14.3) consistent with primary bronchogenic carcinoma.  There was equivocal nodal tissue in the subcarinal station demonstrating mild hypermetabolism (SUV 3.6). There  was more peripheral left lower lobe increased atelectasis with mucoid impaction which is likely secondary to endobronchial obstruction.  There was a small left pleural effusion.  Head MRI on 07/07/2016 revealed no evidence of metastatic disease.  She received radiation from 07/22/2016 - 09/19/2016.  She received 5 weeks of concurrent carboplatin and Taxol (07/24/2016 - 08/11/2016; 09/05/2016 - 09/16/2016). She requires a reduced dose of Benadryl (25 mg) for her premedication.  Week #3 was postponed secondary to chest pain and evaluation.  She missed a couple of weeks.  Chest CT on 11/21/2016 revealed progressive collapse of the LEFT lower lobe presumably related to central obstructing mass.  Mass obstructed the LEFT lower lobe bronchus, although mass was not well demonstrated.  There were small effusions (no change).  There was centrilobular emphysema unchanged.  There was no evidence pneumonia.  PET scan on 01/27/2017 revealed interval response to therapy.  The previously noted hypermetabolism associated with left lower lobe perihilar lung mass has resolved in the interval.  There was a moderate left pleural effusion that had increased in volume from previous PET-CT and there was now complete atelectasis/consolidation of the left lower lobe, which may obscure residual mass.  There was no new sites of hypermetabolism or  evidence of distant metastatic disease.  Bronchoscopy on 02/09/2017 revealed no evidence of endobronchial tumor.  There was stenosis at the entry to the left lower posterior segments which could not be entered.  There was mildly erythematous mucosa with copious secretions in the left lower lobe.  Brushings were positive for non-small cell carcinoma, favor squamous cell carcinoma.  Ultrasound guided thoracentesis on 02/17/2017 revealed was negative for malignancy.  CEA was 7.9 on 04/09/2017.  Bone scan on 02/27/2017 revealed a subtle focus of uptake in the left 7th rib, indeterminate.   Bone scan on 06/04/2017 revealed abnormal radiotracer uptake at T7 and T8 at the sites of recent kyphoplasty procedures. There was stable increased uptake in the mid lower lumbar regions, likely of arthropathic etiology. Elsewhere, the distribution of uptake is unremarkable.  She is  s/p cycle #3 carboplatin, Abraxane and pembrolizumab (03/19/2017 - 05/08/2017).  Cycle #2 was truncated secondary to pneumonia. Cycle #4 was postponed on 06/04/2017 due to severe pain.    Chest CT angiogram on 04/30/2017 revealed no pulmonary embolism.  There was a new wedge compression fracture of T7.  She has chronic atelectasis of the left lower lobe and left sided effusion.  Thoracic spine MRI on 05/15/2017 revealed severe benign-appearing subacute compression fracture of T7 without neural impingement.  There was acute new compression fracture of the superior endplate of T8.  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  She underwent T7 kyphoplasty on 05/22/2017.    Chest CT on 06/18/2017 demonstrated left lower lobe collapse/consolidation extending into the inferior left hilum (stable).  She has a normocytic anemia likely due to chemotherapy.  Work-up on 07/24/2016 revealed the following normal labs: ferritin (103), B12 (345), folate(19.7).  Iron saturation was 8% and TIBC was 258.  She has B12 deficiency.  B12 was 254 on 04/16/2017 and MMA 789 (high) on 04/23/2017.  She began B12 on 04/30/2016 (last 06/25/2017).  She has a history of stage IA left breast cancer in 02/2005.  She underwent lumpectomy.  Pathology revealed a T1cN0 lesion.  Tumor was ER + , PR +, and Her2/neu -.  Two sentinel lymph nodes were negative.  She received chemotherapy (4 cycles of AC and possibly an abbreviated course of Taxol- no records available).  She received radiation.  She completed Femara in 08/2010.  She has fibromyalgia.  She has advanced thoracoabdominal  aortic atherosclerosis with fusiform dilatation of the descending thoracic aorta up to 4.3 cm.  She is followed by Dr. Lucky Cowboy.  She has a history of recurrent UTIs.  UA on 03/19/2017 revealed  >100,000 CFU/mL of an ESBL producing E. coli.  She was treated with fosfomycin 3 g x 1 dose.  Urine culture on 05/15/2017 revealed ESBL producing E coli.  Sputum culture on 05/15/2017 revealed Corynebacterium striatum.  She was admitted to [facility] from 06/30/2017 - [id number] for complicated ESBL producing E.coli UTI and bacteremia. She received IV meropenem.  ID did not recommend port removal. She was discharged home on a 7 day course of intravenous Invanz.  She was admitted to [facility] from 07/17/2017 - 07/20/2017 with chronic respiratory failure with hypoxia, health care associated pneumonia, and sepsis.  She presented with altered mental status and hypoxia due to left-sided pneumonia. She received IV vancomycin, cefepime.  She was discharged with Levaquin.  She is allergic to contrast dye and requires standard 13 hour premedication (ranitidine, diphenhydramine, prednisone) regimen.  Symptomatically, she has ongoing respiratory issues.  She is on 4 liters of oxygen via [place].  Performance status has declined.  Hemoglobin is 10.7.  Plan: 1. Labs today: CBC with diff, CMP, Mg, TSH, free T4. 2. Discuss interval hospitalization.  Notes reviewed. 3. Discuss orthopedics consult and plan for thoracic spine MRI. Patient wishes to defer at this point.  4. Discuss plan for cystoscopy. Due to an acute illness, this procedure was deferred.  5. B12 today and then monthly (overdue). 6. Discuss HYPOxia. Patient with SPO2 in the 70s on 4L/. Different machines tried. Patient refuses ED evaluation citing that she "just wants to go home and call Hospice".  7. Discuss CODE STATUS. Patient unsure citing that her son with "be upset". Advanced directives packet provided for further discussion with son.  8. Discuss hospice referral for  goals of care and end of life planning. Patient verbalizes agreement with referral.  9. Discuss pain management.  Duragesic patch  dose increased to 50 mcg/hr on 06/12/2017. She continues Roxicodone 5 mg every 4 hours PRN.  Pain is better controlled. 10. RTC in 2 weeks  for MD assessment, labs (CBC with diff, CMP, Mg).  A total of (> 25) minutes of face-to-face time was spent with the patient with greater than 50% of that time in counseling and care-coordination.    Honor Loh, NP  08/06/2017, 5:37 PM  I saw and evaluated the patient, participating in the key portions of the service and reviewing pertinent diagnostic studies and records.  I reviewed the nurse practitioner's note and agree with the findings and the plan.  The assessment and plan were discussed with the patient.  Multiple questions were asked by the patient and answered.   Nolon Stalls, MD 08/06/2017, 5:37 PM

## 2017-08-07 ENCOUNTER — Other Ambulatory Visit: Payer: Self-pay | Admitting: *Deleted

## 2017-08-07 MED ORDER — HYDROCODONE-ACETAMINOPHEN 5-325 MG PO TABS: 1.0000 | ORAL_TABLET | Freq: Two times a day (BID) | ORAL | 0 refills | Status: DC | PRN

## 2017-08-07 NOTE — Telephone Encounter (Signed)
Patient has declined hospice services at this time wanting to stay with home health for a few more weeks to see if she will strengthen enough to resume her chemotherapy treatment. She also requested the hospice nurse ask if we will refill her Norco

## 2017-08-16 DIAGNOSIS — I5022 Chronic systolic (congestive) heart failure: Secondary | ICD-10-CM | POA: Diagnosis not present

## 2017-08-19 NOTE — Progress Notes (Deleted)
[facility] day:  08/19/2017     Chief Complaint: [name] [name] is a 77 y.o. 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  HPI: The patient was last seen in the medical oncology clinic on 08/06/2017.  At that time, patient had been "having a hard time" with regards to her overall respiratory status. Supplemental oxygen has been increased to 4L/[place] around the clock. Pain in back and ribs had improved, therefore she declined further assessment by orthopedics. Patient's performance status continued to decline. She noted that she was spending the majority of her day resting citing the fact that she "did not feel like doing anything". She was anergic with progressive weakness. She was falling around. Multiple areas of bruising noted on exam. She required assistance with her ADLs. Appetite poor; weight stable. She refused appetite stimulants. SPO2 low in clinic. Refused ED evaluation. She received B12 injection.   During her last visit, the futility of further treatment was discussed at length. Patient noted that she was tired and "tired of everything". She verbalized that she has continued to pursue treatments due to the fact that her son would be "upset with her" if she stopped. Extended discussion help surround goals of care and quality of like. Patient seemed to understand, and ultimately elected to pursue hospice services. Of note, patient had no legal advanced directives in place. Packet was provided for discussion with family. Referral was faxed to [facility].   Received communication from outpatient hospice medicine team on XXX to advise that when patient was contacted for admission, she declined their services.   In the interim,   Past Medical History:  Diagnosis Date  . Asthma   . Breast cancer ([place]) 2009   left  . Cancer ([place])   . CHF (congestive heart failure)  ([place])   . CHF (congestive heart failure) ([place])   . Chronic UTI   . COPD (chronic obstructive pulmonary disease) ([place])   . Dizziness   . Fibromyalgia   . Hypertension   . Neuropathy   . Personal history of tobacco use, presenting hazards to health 05/17/2015  . Polyp, larynx   . RA (rheumatoid arthritis) ([place])   . Sinus infection    recent  . Stumbling gait    to the left  . Supplemental oxygen dependent    2.5l    Past Surgical History:  Procedure Laterality Date  . ABDOMINAL HYSTERECTOMY    . BREAST LUMPECTOMY Left 2009   chemo and radiation  . CYST EXCISION Left 02/27/2015   Procedure: CYST REMOVAL;  Surgeon: Hessie Knows, MD;  Location: [facility] ORS;  Service: Orthopedics;  Laterality: Left;  . EYE MUSCLE SURGERY Right    13 surgeries  . FLEXIBLE BRONCHOSCOPY N/A 07/01/2016   Procedure: FLEXIBLE BRONCHOSCOPY;  Surgeon: Wilhelmina Mcardle, MD;  Location: [facility] OR[facility];  Service: Pulmonary;  Laterality: N/A;  . FLEXIBLE BRONCHOSCOPY N/A 02/09/2017   Procedure: FLEXIBLE BRONCHOSCOPY;  Surgeon: [name], MD;  Location: [facility] OR[facility];  Service: Pulmonary;  Laterality: N/A;  . KYPHOPLASTY N/A 05/22/2017   Procedure: Celine Ahr;  Surgeon: Hessie Knows, MD;  Location: [facility] OR[facility];  Service: Orthopedics;  Laterality: N/A;  . PORTA CATH INSERTION N/A 03/04/2017   Procedure: PORTA CATH INSERTION;  Surgeon: [name], MD;  Location: [facility];  Service: Cardiovascular;  Laterality: N/A;  . THUMB ARTHROSCOPY Left  Family History  Problem Relation Age of Onset  . Diabetes Father   . Stroke Father   . Heart attack Father   . CAD Sister     Social History:  reports that she has been smoking cigarettes.  She has a 40.00 pack-year smoking history. She has never used smokeless tobacco. She reports that she does not drink alcohol or use drugs.  He lives with her son and grandson. Her grandson is in the waiting room.  The patient is alone today.   Allergies:  Allergies   Allergen Reactions  . Contrast Media [Iodinated Diagnostic Agents] Shortness Of Breath    Acute onset of shortness of breath following CT contrast administration 06/18/17. Sent to the ED. Dr. Kerman Passey documented incident as an allergic reaction. Needs premedications prior to future contrast media injections. Previous questionable contrast allergy, so the patient was given entire or partial premedications without complications. She was not given any premedications on 06/18/17, resulting in shortness of breath and an ED visit.  . Sulfa Antibiotics Other (See Comments)    Unknown  . Tetracycline Hives  . White Petrolatum Other (See Comments)    Blisters  . Amoxicillin-Pot Clavulanate Rash and Other (See Comments)    Blisters in mouth Has patient had a PCN reaction causing immediate rash, facial/tongue/throat swelling, SOB or lightheadedness with hypotension: No Has patient had a PCN reaction causing severe rash involving mucus membranes or skin necrosis: No Has patient had a PCN reaction that required hospitalization: No Has patient had a PCN reaction occurring within the last 10 years: No If all of the above answers are "NO", then may proceed with Cephalosporin use.   . Tape Rash    Current Medications: Current Outpatient Medications  Medication Sig Dispense Refill  . albuterol (PROVENTIL HFA;VENTOLIN HFA) 108 (90 Base) MCG/ACT inhaler Inhale 1-2 puffs into the lungs every 6 (six) hours as needed for wheezing or shortness of breath. (Patient taking differently: Inhale 2 puffs into the lungs every 6 (six) hours as needed for wheezing or shortness of breath. ) 1 Inhaler 2  . clonazePAM (KLONOPIN) 1 MG tablet Take 1 mg by mouth daily as needed for anxiety.     . fentaNYL (DURAGESIC - DOSED MCG/HR) 50 MCG/HR Place 1 patch (50 mcg total) onto the skin every 3 (three) days. 5 patch 0  . gabapentin (NEURONTIN) 600 MG tablet Take 600 mg by mouth 3 (three) times daily.     [name] HYDROcodone-acetaminophen  (NORCO/VICODIN) 5-325 MG tablet Take 1 tablet by mouth 2 (two) times daily as needed. 30 tablet 0  . LORazepam (ATIVAN) 0.5 MG tablet Take 1 tablet (0.5 mg total) by mouth every 6 (six) hours as needed (Nausea or vomiting). 30 tablet 0  . nitroGLYCERIN (NITROSTAT) 0.4 MG SL tablet Place 0.4 mg under the tongue every 5 (five) minutes as needed for chest pain.     [name] ondansetron (ZOFRAN) 8 MG tablet Take 1 tablet (8 mg total) by mouth 2 (two) times daily as needed. Start on the third day after chemotherapy. (Patient taking differently: Take 8 mg by mouth 2 (two) times daily as needed for nausea or vomiting. Start on the third day after chemotherapy.) 30 tablet 1  . predniSONE (STERAPRED UNI-PAK 21 TAB) 10 MG (21) TBPK tablet Taper by 10 mg p.o. daily 21 tablet 0  . zolpidem (AMBIEN) 5 MG tablet Take 5 mg by mouth at bedtime as needed for sleep.      No current facility-administered medications for  this visit.    Facility-Administered Medications Ordered in Other Visits  Medication Dose Route Frequency Provider Last Rate Last Dose  . cyanocobalamin ((VITAMIN B-12)) injection 1,000 mcg  1,000 mcg Intramuscular Once Honor Loh E, NP      . heparin lock flush 100 unit/mL  500 Units Intravenous Once Lequita Asal, MD        Review of Systems  Constitutional: Positive for malaise/fatigue. Negative for diaphoresis, fever and weight loss.       Decondition and progressive debility. Sits in recliner all day and "rests".  HENT: Negative.   Eyes: Negative.   Respiratory: Positive for cough (chronic) and shortness of breath (related to end stage COPD). Negative for hemoptysis and sputum production.        Supplemental oxygen 4L/[place] ATC  Cardiovascular: Positive for orthopnea. Negative for chest pain, palpitations, leg swelling and PND.  Gastrointestinal: Negative for abdominal pain, blood in stool, constipation, diarrhea, melena, nausea and vomiting.  Genitourinary: Negative for dysuria, frequency,  hematuria and urgency.       Recurrent ESBL producing UTIs  Musculoskeletal: Positive for back pain and joint pain. Negative for falls and myalgias.  Skin: Negative for itching and rash.  Neurological: Positive for weakness (progressive). Negative for dizziness, tremors and headaches.  Endo/Heme/Allergies: Does not bruise/bleed easily.  Psychiatric/Behavioral: Negative for depression, memory loss and suicidal ideas. The patient is not nervous/anxious and does not have insomnia.   All other systems reviewed and are negative.  Performance status (ECOG): 3 - Symptomatic, >50% confined to bed  Vital Signs There were no vitals taken for this visit.  Physical Exam  Constitutional: She is oriented to person, place, and time. She appears malnourished. She appears unhealthy. She appears cachectic.  HENT:  Head: Normocephalic and atraumatic.  Eyes: Pupils are equal, round, and reactive to light. EOM are normal. No scleral icterus.  Blue eyes  Neck: Normal range of motion. Neck supple. No tracheal deviation present. No thyromegaly present.  Cardiovascular: Normal rate, regular rhythm and normal heart sounds. Exam reveals no gallop and no friction rub.  No murmur heard. Pulmonary/Chest: Effort normal and breath sounds normal. No respiratory distress. She has no wheezes. She has no rales.  Supplemental oxygen in place via [place]  Abdominal: Soft. Bowel sounds are normal. She exhibits no distension. There is no tenderness.  Musculoskeletal: Normal range of motion. She exhibits no edema or tenderness.  Lymphadenopathy:    She has no cervical adenopathy.    She has no axillary adenopathy.       Right: No inguinal and no supraclavicular adenopathy present.       Left: No inguinal and no supraclavicular adenopathy present.  Neurological: She is alert and oriented to person, place, and time.  Skin: Skin is warm and dry. No rash noted. No erythema.  Psychiatric: Mood, affect and judgment normal.  Nursing  note and vitals reviewed.   No visits with results within 3 Day(s) from this visit.  Latest known visit with results is:  Orders Only on 08/06/2017  Component Date Value Ref Range Status  . Free T4 08/06/2017 1.46  0.82 - 1.77 ng/dL Final   Comment: (NOTE) Biotin ingestion may interfere with free T4 tests. If the results are inconsistent with the TSH level, previous test results, or the clinical presentation, then consider biotin interference. If needed, order repeat testing after stopping biotin. Performed at [facility], [address], [place], [place] [address]   . WBC 08/06/2017 6.6  3.6 -  11.0 K/uL Final  . RBC 08/06/2017 3.49* 3.80 - 5.20 MIL/uL Final  . Hemoglobin 08/06/2017 10.7* 12.0 - 16.0 g/dL Final  . HCT 08/06/2017 32.4* 35.0 - 47.0 % Final  . MCV 08/06/2017 92.8  80.0 - 100.0 fL Final  . MCH 08/06/2017 30.7  26.0 - 34.0 pg Final  . MCHC 08/06/2017 33.1  32.0 - 36.0 g/dL Final  . RDW 08/06/2017 16.7* 11.5 - 14.5 % Final  . Platelets 08/06/2017 121* 150 - 440 K/uL Final  . Neutrophils Relative % 08/06/2017 80  % Final  . Neutro Abs 08/06/2017 5.3  1.4 - 6.5 K/uL Final  . Lymphocytes Relative 08/06/2017 10  % Final  . Lymphs Abs 08/06/2017 0.7* 1.0 - 3.6 K/uL Final  . Monocytes Relative 08/06/2017 7  % Final  . Monocytes Absolute 08/06/2017 0.5  0.2 - 0.9 K/uL Final  . Eosinophils Relative 08/06/2017 2  % Final  . Eosinophils Absolute 08/06/2017 0.2  0 - 0.7 K/uL Final  . Basophils Relative 08/06/2017 1  % Final  . Basophils Absolute 08/06/2017 0.0  0 - 0.1 K/uL Final   Performed at [facility], [address] [place], [place] [address]  . TSH 08/06/2017 1.603  0.350 - 4.500 uIU/mL Final   Comment: Performed by a 3rd Generation assay with a functional sensitivity of <=0.01 uIU/mL. Performed at [facility], [address] [place], [place] [address]   . Magnesium 08/06/2017 2.0  1.7 - 2.4 mg/dL Final   Performed at [facility],  [address] [place], [place] [address]  . Sodium 08/06/2017 133* 135 - 145 mmol/L Final  . Potassium 08/06/2017 4.1  3.5 - 5.1 mmol/L Final  . Chloride 08/06/2017 90* 98 - 111 mmol/L Final  . CO2 08/06/2017 30  22 - 32 mmol/L Final  . Glucose, Bld 08/06/2017 120* 70 - 99 mg/dL Final  . BUN 08/06/2017 12  8 - 23 mg/dL Final  . Creatinine, Ser 08/06/2017 1.07* 0.44 - 1.00 mg/dL Final  . Calcium 08/06/2017 9.0  8.9 - 10.3 mg/dL Final  . Total Protein 08/06/2017 6.8  6.5 - 8.1 g/dL Final  . Albumin 08/06/2017 3.2* 3.5 - 5.0 g/dL Final  . AST 08/06/2017 17  15 - 41 U/L Final  . ALT 08/06/2017 10  0 - 44 U/L Final  . Alkaline Phosphatase 08/06/2017 143* 38 - 126 U/L Final  . Total Bilirubin 08/06/2017 0.5  0.3 - 1.2 mg/dL Final  . GFR calc non Af Amer 08/06/2017 50* >60 mL/min Final  . GFR calc Af Amer 08/06/2017 58* >60 mL/min Final   Comment: (NOTE) The eGFR has been calculated using the CKD EPI equation. This calculation has not been validated in all clinical situations. eGFR's persistently <60 mL/min signify possible Chronic Kidney Disease.   Georgiann Hahn gap 08/06/2017 13  5 - 15 Final   Performed at [facility], [address] [place], [place] [address]    Assessment:  [name] [name] is a 77 y.o. female with presumed metastatic squamous cell lung cancer.  She presented with clinical [id number] squamous cell lung cancer of the left lung s/p bronchoscopy and biopsy on 07/01/2016.  She has a 40 pack year smoking history.  She presented with left lower chest wall pain.  Foundation One testing on 03/13/2017 revealed no reportable alterations in EGFR, RET, ALK, MET, ERBB2, BRAF, and ROS1.    Tumor was MS-stable.  TMB was 3 Muts/Mb.  There was CDKN2A loss, CDKN2B  loss, KRAS G12V, MTAP loss, and TP53 R282W.  PD-L1 was 5%.  Chest CT with contrast on 06/11/2016 revealed a 3.2 x 3.0 mass like area of focal opacity in the medial left lower lobe with obliteration of segmental airways to the  anterior left lower lobe.  PET scan on 06/27/2016 revealed a 4.3 x 3.2 cm central left lower lobe lung lesion (SUV 14.3) consistent with primary bronchogenic carcinoma.  There was equivocal nodal tissue in the subcarinal station demonstrating mild hypermetabolism (SUV 3.6). There was more peripheral left lower lobe increased atelectasis with mucoid impaction which is likely secondary to endobronchial obstruction.  There was a small left pleural effusion.  Head MRI on 07/07/2016 revealed no evidence of metastatic disease.  She received radiation from 07/22/2016 - 09/19/2016.  She received 5 weeks of concurrent carboplatin and Taxol (07/24/2016 - 08/11/2016; 09/05/2016 - 09/16/2016). She requires a reduced dose of Benadryl (25 mg) for her premedication.  Week #3 was postponed secondary to chest pain and evaluation.  She missed a couple of weeks.  Chest CT on 11/21/2016 revealed progressive collapse of the LEFT lower lobe presumably related to central obstructing mass.  Mass obstructed the LEFT lower lobe bronchus, although mass was not well demonstrated.  There were small effusions (no change).  There was centrilobular emphysema unchanged.  There was no evidence pneumonia.  PET scan on 01/27/2017 revealed interval response to therapy.  The previously noted hypermetabolism associated with left lower lobe perihilar lung mass has resolved in the interval.  There was a moderate left pleural effusion that had increased in volume from previous PET-CT and there was now complete atelectasis/consolidation of the left lower lobe, which may obscure residual mass.  There was no new sites of hypermetabolism or evidence of distant metastatic disease.  Bronchoscopy on 02/09/2017 revealed no evidence of endobronchial tumor.  There was stenosis at the entry to the left lower posterior segments which could not be entered.  There was mildly erythematous mucosa with copious secretions in the left lower lobe.  Brushings were  positive for non-small cell carcinoma, favor squamous cell carcinoma.  Ultrasound guided thoracentesis on 02/17/2017 revealed was negative for malignancy.  CEA was 7.9 on 04/09/2017.  Bone scan on 02/27/2017 revealed a subtle focus of uptake in the left 7th rib, indeterminate.  Bone scan on 06/04/2017 revealed abnormal radiotracer uptake at T7 and T8 at the sites of recent kyphoplasty procedures. There was stable increased uptake in the mid lower lumbar regions, likely of arthropathic etiology. Elsewhere, the distribution of uptake is unremarkable.  She is  s/p cycle #3 carboplatin, Abraxane and pembrolizumab (03/19/2017 - 05/08/2017).  Cycle #2 was truncated secondary to pneumonia. Cycle #4 was postponed on 06/04/2017 due to severe pain.    Chest CT angiogram on 04/30/2017 revealed no pulmonary embolism.  There was a new wedge compression fracture of T7.  She has chronic atelectasis of the left lower lobe and left sided effusion.  Thoracic spine MRI on 05/15/2017 revealed severe benign-appearing subacute compression fracture of T7 without neural impingement.  There was acute new compression fracture of the superior endplate of T8.  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  She underwent T7 kyphoplasty on 05/22/2017.    Chest CT on 06/18/2017 demonstrated left lower lobe collapse/consolidation extending into the inferior left hilum (stable).  She has a normocytic anemia likely due to chemotherapy.  Work-up on 07/24/2016 revealed the following normal labs: ferritin (  103), B12 (345), folate(19.7).  Iron saturation was 8% and TIBC was 258.  She has B12 deficiency.  B12 was 254 on 04/16/2017 and MMA 789 (high) on 04/23/2017.  She began B12 on 04/30/2016 (last 06/25/2017).  She has a history of stage IA left breast cancer in 02/2005.  She underwent lumpectomy.  Pathology revealed a T1cN0 lesion.  Tumor was ER + , PR +, and  Her2/neu -.  Two sentinel lymph nodes were negative.  She received chemotherapy (4 cycles of AC and possibly an abbreviated course of Taxol- no records available).  She received radiation.  She completed Femara in 08/2010.  She has fibromyalgia.  She has advanced thoracoabdominal aortic atherosclerosis with fusiform dilatation of the descending thoracic aorta up to 4.3 cm.  She is followed by Dr. Lucky Cowboy.  She has a history of recurrent UTIs.  UA on 03/19/2017 revealed  >100,000 CFU/mL of an ESBL producing E. coli.  She was treated with fosfomycin 3 g x 1 dose.  Urine culture on 05/15/2017 revealed ESBL producing E coli.  Sputum culture on 05/15/2017 revealed Corynebacterium striatum.  She was admitted to [facility] from 06/30/2017 - [id number]/4193 for complicated ESBL producing E.coli UTI and bacteremia. She received IV meropenem.  ID did not recommend port removal. She was discharged home on a 7 day course of intravenous Invanz.  She was admitted to [facility] from 07/17/2017 - 07/20/2017 with chronic respiratory failure with hypoxia, health care associated pneumonia, and sepsis.  She presented with altered mental status and hypoxia due to left-sided pneumonia. She received IV vancomycin, cefepime.  She was discharged with Levaquin.  She is allergic to contrast dye and requires premedications.  Symptomatically, she is fatigued.  Her back/rib pain has improved. Her performance status continues to decline. She requires assistance with her ADLs. Her breathing has continued to worsen. She is now on supplemental oxygen at 4L/[place].  Plan:  1. Labs today: CBC with diff, CMP, Mg 2. Metastatic lung cancer - progressing  Review progression of extensive metastatic disease. Patient continues to decline overall.   Oncology diagnosis is complicated by end stage COPD. Patient continues to require supplemental oxygen at 4L/[place] ATC. 3. Fatigue - chronic  Review multifactorial etiology (lung cancer, COPD, anemia of chronic  disease, malnutrition, and vitamin B12 deficiency).   Patient remains markedly fatigued despite "resting" all day and maintaining a virtually sedentary lifestyle due to her overall poor health state. 4. B12 deficiency - stable  Continues on monthly parenteral supplementation at this point. Discussed that injections may be given at home if she would prefer in order to prevent multiple trips to the cancer center.  5. Protein calorie malnutrition -   Patient continues to lose weight. Her weight today is  . Her BMI of   places her in the XXX weight category. Encouraged her to increase her intake of calorie and protein dense food choices. Additionally, patient was encouraged to utilize nutritional supplement shakes at least 2-3 times a day.    Continues to refuse appetite stimulants as offered by clinical team.  6. Pain and symptom management - needs met Patient has home oxygen, antiemetics, and pain medications at home to use on a PRN basis. Patient advising that the prescribed interventions are adequate at this point.  Pain: Duragesic 50 mcg patches, with Roxicodone 5 mg q4h PRN for breakthrough. Nausea: ondansetron 8 mg q8h PRN, lorazepam 0.5 mg q6h PRN Oxygen: 4L/[place] around the clock  7. Goals of care - ongoing  Discuss previous referral  to outpatient hospice. Patient declined admission on XXX.  Readdress advanced directives.  8. RTC  Honor Loh, NP  08/19/17, 11:42 PM  I saw and evaluated the patient, participating in the key portions of the service and reviewing pertinent diagnostic studies and records.  I reviewed the nurse practitioner's note and agree with the findings and the plan.  The assessment and plan were discussed with the patient.  Multiple questions were asked by the patient and answered.   Nolon Stalls, MD 08/19/17, 11:42 PM

## 2017-08-20 ENCOUNTER — Ambulatory Visit: Payer: Medicare HMO

## 2017-08-20 ENCOUNTER — Inpatient Hospital Stay: Payer: Medicare HMO | Admitting: Hematology and Oncology

## 2017-08-20 ENCOUNTER — Inpatient Hospital Stay: Payer: Medicare HMO

## 2017-08-20 DIAGNOSIS — R530 Neoplastic (malignant) related fatigue: Secondary | ICD-10-CM | POA: Insufficient documentation

## 2017-08-22 NOTE — Progress Notes (Signed)
[facility] day:  08/24/2017     Chief Complaint: [name] [name] is a 77 y.o. 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  HPI: The patient was last seen in the medical oncology clinic on 08/06/2017.  At that time, patient had been "having a hard time" with regards to her overall respiratory status. Supplemental oxygen has been increased to 4L/[place] around the clock. Pain in back and ribs had improved, therefore she declined further assessment by orthopedics. Patient's performance status continued to decline. She noted that she was spending the majority of her day resting citing the fact that she "did not feel like doing anything". She was anergic with progressive weakness. She was falling around. Multiple areas of bruising noted on exam. She required assistance with her ADLs. Appetite poor; weight stable. She refused appetite stimulants. SPO2 low in clinic. Refused ED evaluation. She received B12 injection.   During her last visit, the futility of further treatment was discussed at length. Patient noted that she was tired and "tired of everything". She verbalized that she has continued to pursue treatments due to the fact that her son would be "upset with her" if she stopped. Extended discussion help surround goals of care and quality of like. Patient seemed to understand, and ultimately elected to pursue hospice services. Of note, patient had no legal advanced directives in place. Packet was provided for discussion with family. Referral was faxed to [facility].   Received communication from outpatient hospice medicine team  to advise that when patient was contacted for admission, she declined their services. Patient notes that she and her son don't think that she needs this level of care at this point.   Patient was to RTC for a follow up visit on 08/20/2017, however  appointment was ultimately cancelled following a call from the patient where she indicated that she "felt too bad to come".   In the interim, patient notes that she has been "sick as a dog" for the last 2-3 weeks. Patient states, "something is taking my energy away". Patient with progressive weakness. She is falling around a lot. Patient with large areas of bruising to her BILATERAL upper and lower extremities. Patient cannot complete ADL/IADLs independently. She has had in home caregivers Kalman Shan and [place]) for the last 2 weeks. She remains on supplemental oxygen at 4L/. SPO2 is 92% in clinic. Patient spends the majority of her day "just sitting and resting". Caregivers advising that in home caregiver "get on her nerves" because they "wont let her do anything".   Patient notes that her pain is uncontrolled. She is not using the Duragesic patches as prescribed. She states, "I put it on and waited 3 hours, but nothing happened. They don't work". She has medications to use for breakthrough pain.   Patient denies that she has experienced any B symptoms. She denies any interval infections. Patient advises that she maintains a decreased appetite. Weight today is 96 lb 2 oz (43.6 kg), which compared to her last visit to the clinic, represents a 12 pound decrease.  She attributes her poor appetite to previous issues with gastric ulcers. Patient has pressure related skin breakdown to her sacrum.   Patient denies pain in the clinic today.   Past Medical History:  Diagnosis Date  . Asthma   . Breast cancer ([place]) 2009   left  . Cancer ([place])   .  CHF (congestive heart failure) ([place])   . CHF (congestive heart failure) ([place])   . Chronic UTI   . COPD (chronic obstructive pulmonary disease) ([place])   . Dizziness   . Fibromyalgia   . Hypertension   . Neuropathy   . Personal history of tobacco use, presenting hazards to health 05/17/2015  . Polyp, larynx   . RA (rheumatoid arthritis) ([place])   . Sinus infection     recent  . Stumbling gait    to the left  . Supplemental oxygen dependent    2.5l    Past Surgical History:  Procedure Laterality Date  . ABDOMINAL HYSTERECTOMY    . BREAST LUMPECTOMY Left 2009   chemo and radiation  . CYST EXCISION Left 02/27/2015   Procedure: CYST REMOVAL;  Surgeon: Hessie Knows, MD;  Location: [facility] ORS;  Service: Orthopedics;  Laterality: Left;  . EYE MUSCLE SURGERY Right    13 surgeries  . FLEXIBLE BRONCHOSCOPY N/A 07/01/2016   Procedure: FLEXIBLE BRONCHOSCOPY;  Surgeon: Wilhelmina Mcardle, MD;  Location: [facility] ORS;  Service: Pulmonary;  Laterality: N/A;  . FLEXIBLE BRONCHOSCOPY N/A 02/09/2017   Procedure: FLEXIBLE BRONCHOSCOPY;  Surgeon: [name], MD;  Location: [facility] ORS;  Service: Pulmonary;  Laterality: N/A;  . KYPHOPLASTY N/A 05/22/2017   Procedure: Celine Ahr;  Surgeon: Hessie Knows, MD;  Location: [facility] ORS;  Service: Orthopedics;  Laterality: N/A;  . PORTA CATH INSERTION N/A 03/04/2017   Procedure: PORTA CATH INSERTION;  Surgeon: [name], MD;  Location: [facility];  Service: Cardiovascular;  Laterality: N/A;  . THUMB ARTHROSCOPY Left     Family History  Problem Relation Age of Onset  . Diabetes Father   . Stroke Father   . Heart attack Father   . CAD Sister     Social History:  reports that she has been smoking cigarettes. She has a 40.00 pack-year smoking history. She has never used smokeless tobacco. She reports that she does not drink alcohol or use drugs.  He lives with her son and grandson. Her grandson is in the waiting room.  The patient is accompanied by a woman, Jenny Reichmann, today.   Allergies:  Allergies  Allergen Reactions  . Contrast Media [Iodinated Diagnostic Agents] Shortness Of Breath    Acute onset of shortness of breath following CT contrast administration 06/18/17. Sent to the ED. Dr. Kerman Passey documented incident as an allergic reaction. Needs premedications prior to future contrast media injections.  Previous questionable contrast allergy, so the patient was given entire or partial premedications without complications. She was not given any premedications on 06/18/17, resulting in shortness of breath and an ED visit.  . Sulfa Antibiotics Other (See Comments)    Unknown  . Tetracycline Hives  . White Petrolatum Other (See Comments)    Blisters  . Amoxicillin-Pot Clavulanate Rash and Other (See Comments)    Blisters in mouth Has patient had a PCN reaction causing immediate rash, facial/tongue/throat swelling, SOB or lightheadedness with hypotension: No Has patient had a PCN reaction causing severe rash involving mucus membranes or skin necrosis: No Has patient had a PCN reaction that required hospitalization: No Has patient had a PCN reaction occurring within the last 10 years: No If all of the above answers are "NO", then may proceed with Cephalosporin use.   . Tape Rash    Current Medications: Current Outpatient Medications  Medication Sig Dispense Refill  . albuterol (PROVENTIL HFA;VENTOLIN HFA) 108 (90 Base) MCG/ACT inhaler Inhale 1-2 puffs into the lungs  every 6 (six) hours as needed for wheezing or shortness of breath. (Patient taking differently: Inhale 2 puffs into the lungs every 6 (six) hours as needed for wheezing or shortness of breath. ) 1 Inhaler 2  . clonazePAM (KLONOPIN) 1 MG tablet Take 1 mg by mouth daily as needed for anxiety.     . fentaNYL (DURAGESIC - DOSED MCG/HR) 50 MCG/HR Place 1 patch (50 mcg total) onto the skin every 3 (three) days. 5 patch 0  . gabapentin (NEURONTIN) 600 MG tablet Take 600 mg by mouth 3 (three) times daily.     [name] HYDROcodone-acetaminophen (NORCO/VICODIN) 5-325 MG tablet Take 1 tablet by mouth 2 (two) times daily as needed. 30 tablet 0  . LORazepam (ATIVAN) 0.5 MG tablet Take 1 tablet (0.5 mg total) by mouth every 6 (six) hours as needed (Nausea or vomiting). 30 tablet 0  . nitroGLYCERIN (NITROSTAT) 0.4 MG SL tablet Place 0.4 mg under the tongue  every 5 (five) minutes as needed for chest pain.     [name] ondansetron (ZOFRAN) 8 MG tablet Take 1 tablet (8 mg total) by mouth 2 (two) times daily as needed. Start on the third day after chemotherapy. (Patient taking differently: Take 8 mg by mouth 2 (two) times daily as needed for nausea or vomiting. Start on the third day after chemotherapy.) 30 tablet 1  . predniSONE (STERAPRED UNI-PAK 21 TAB) 10 MG (21) TBPK tablet Taper by 10 mg p.o. daily 21 tablet 0  . zolpidem (AMBIEN) 5 MG tablet Take 5 mg by mouth at bedtime as needed for sleep.      No current facility-administered medications for this visit.    Facility-Administered Medications Ordered in Other Visits  Medication Dose Route Frequency Provider Last Rate Last Dose  . cyanocobalamin ((VITAMIN B-12)) injection 1,000 mcg  1,000 mcg Intramuscular Once Honor Loh E, NP      . heparin lock flush 100 unit/mL  500 Units Intravenous Once Lequita Asal, MD        Review of Systems  Constitutional: Positive for malaise/fatigue and weight loss (12 pounds). Negative for chills, diaphoresis and fever.       Decondition and progressive debility. Sits in recliner all day and "rests".  HENT: Negative.  Negative for congestion, ear discharge, ear pain, nosebleeds, sinus pain, sore throat and tinnitus.   Eyes: Negative for blurred vision, double vision, photophobia, pain and discharge.  Respiratory: Positive for cough (chronic) and shortness of breath (related to end stage COPD). Negative for hemoptysis and sputum production.        Supplemental oxygen 4L/[place] ATC  Cardiovascular: Positive for orthopnea. Negative for chest pain, palpitations, leg swelling and PND.  Gastrointestinal: Negative for abdominal pain, blood in stool, constipation, diarrhea, melena, nausea and vomiting.  Genitourinary: Negative for dysuria, frequency, hematuria and urgency.       Recurrent ESBL producing UTIs  Musculoskeletal: Positive for back pain and joint pain. Negative  for falls and myalgias.       Left rib cage pain.  Skin: Negative for itching and rash.       Pressure related skin breakdown to sacrum.   Neurological: Positive for weakness (progressive). Negative for dizziness, tremors, speech change, seizures and headaches.  Endo/Heme/Allergies: Does not bruise/bleed easily.  Psychiatric/Behavioral: Negative for depression, memory loss and suicidal ideas. The patient is not nervous/anxious and does not have insomnia.   All other systems reviewed and are negative.  Performance status (ECOG): 3 - Symptomatic, >50% confined to bed  Vital Signs BP 107/74 (BP Location: Left Arm, Patient Position: Sitting)   Pulse (!) 101   Temp (!) 94.2 F (34.6 C) (Tympanic)   Resp 18   Wt 96 lb 2 oz (43.6 kg)   SpO2 92% Comment: 4 L O2  BMI 18.16 kg/m   Physical Exam  Constitutional: She is oriented to person, place, and time. She appears malnourished. She appears unhealthy. She appears cachectic.  Sitting in a wheelchair.  HENT:  Head: Normocephalic and atraumatic.  Short gray hair. [place] in place.  Eyes: Pupils are equal, round, and reactive to light. Conjunctivae and EOM are normal. No scleral icterus.  Blue eyes.  Neck: Normal range of motion. Neck supple. No JVD present.  Cardiovascular: Normal rate, regular rhythm and normal heart sounds. Exam reveals no gallop and no friction rub.  No murmur heard. Pulmonary/Chest: Effort normal and breath sounds normal. No respiratory distress. She has no wheezes. She has no rales.  Supplemental oxygen in place via [place]  Abdominal: Soft. Bowel sounds are normal. She exhibits no distension and no mass. There is no tenderness. There is no rebound and no guarding.  Musculoskeletal: Normal range of motion. She exhibits no edema or tenderness.  Lymphadenopathy:    She has no cervical adenopathy.    She has no axillary adenopathy.       Right: No inguinal and no supraclavicular adenopathy present.       Left: No inguinal and no  supraclavicular adenopathy present.  Neurological: She is alert and oriented to person, place, and time.  Skin: Skin is warm and dry. No rash noted. No erythema.  Psychiatric: Mood, affect and judgment normal.  Nursing note and vitals reviewed.   Appointment on 08/24/2017  Component Date Value Ref Range Status  . Magnesium 08/24/2017 1.9  1.7 - 2.4 mg/dL Final   Performed at [facility], [address] [place], [place] [address]  . Sodium 08/24/2017 139  135 - 145 mmol/L Final  . Potassium 08/24/2017 3.1* 3.5 - 5.1 mmol/L Final  . Chloride 08/24/2017 100  98 - 111 mmol/L Final  . CO2 08/24/2017 25  22 - 32 mmol/L Final  . Glucose, Bld 08/24/2017 106* 70 - 99 mg/dL Final  . BUN 08/24/2017 16  8 - 23 mg/dL Final  . Creatinine, Ser 08/24/2017 1.32* 0.44 - 1.00 mg/dL Final  . Calcium 08/24/2017 8.8* 8.9 - 10.3 mg/dL Final  . Total Protein 08/24/2017 7.0  6.5 - 8.1 g/dL Final  . Albumin 08/24/2017 3.4* 3.5 - 5.0 g/dL Final  . AST 08/24/2017 20  15 - 41 U/L Final  . ALT 08/24/2017 8  0 - 44 U/L Final  . Alkaline Phosphatase 08/24/2017 115  38 - 126 U/L Final  . Total Bilirubin 08/24/2017 0.5  0.3 - 1.2 mg/dL Final  . GFR calc non Af Amer 08/24/2017 39* >60 mL/min Final  . GFR calc Af Amer 08/24/2017 45* >60 mL/min Final   Comment: (NOTE) The eGFR has been calculated using the CKD EPI equation. This calculation has not been validated in all clinical situations. eGFR's persistently <60 mL/min signify possible Chronic Kidney Disease.   [name] [name] gap 08/24/2017 14  5 - 15 Final   Performed at [facility], [address] [place], [place] [address]  . WBC 08/24/2017 7.7  3.6 - 11.0 K/uL Final  . RBC 08/24/2017 4.63  3.80 - 5.20 MIL/uL Final  . Hemoglobin 08/24/2017 13.9  12.0 - 16.0 g/dL Final  .  HCT 08/24/2017 42.1  35.0 - 47.0 % Final  . MCV 08/24/2017 90.9  80.0 - 100.0 fL Final  . MCH 08/24/2017 30.1  26.0 - 34.0 pg Final  . MCHC 08/24/2017 33.1  32.0 - 36.0 g/dL  Final  . RDW 08/24/2017 16.6* 11.5 - 14.5 % Final  . Platelets 08/24/2017 218  150 - 440 K/uL Final  . Neutrophils Relative % 08/24/2017 73  % Final  . Neutro Abs 08/24/2017 5.6  1.4 - 6.5 K/uL Final  . Lymphocytes Relative 08/24/2017 14  % Final  . Lymphs Abs 08/24/2017 1.1  1.0 - 3.6 K/uL Final  . Monocytes Relative 08/24/2017 8  % Final  . Monocytes Absolute 08/24/2017 0.6  0.2 - 0.9 K/uL Final  . Eosinophils Relative 08/24/2017 4  % Final  . Eosinophils Absolute 08/24/2017 0.3  0 - 0.7 K/uL Final  . Basophils Relative 08/24/2017 1  % Final  . Basophils Absolute 08/24/2017 0.1  0 - 0.1 K/uL Final   Performed at [facility], [address], [place], [place] [address]    Assessment:  ABAIGEAL MOOMAW is a 77 y.o. female with presumed metastatic squamous cell lung cancer.  She presented with clinical [id number] squamous cell lung cancer of the left lung s/p bronchoscopy and biopsy on 07/01/2016.  She has a 40 pack year smoking history.  She presented with left lower chest wall pain.  Foundation One testing on 03/13/2017 revealed no reportable alterations in EGFR, RET, ALK, MET, ERBB2, BRAF, and ROS1.    Tumor was MS-stable.  TMB was 3 Muts/Mb.  There was CDKN2A loss, CDKN2B loss, KRAS G12V, MTAP loss, and TP53 R282W.  PD-L1 was 5%.  Chest CT with contrast on 06/11/2016 revealed a 3.2 x 3.0 mass like area of focal opacity in the medial left lower lobe with obliteration of segmental airways to the anterior left lower lobe.  PET scan on 06/27/2016 revealed a 4.3 x 3.2 cm central left lower lobe lung lesion (SUV 14.3) consistent with primary bronchogenic carcinoma.  There was equivocal nodal tissue in the subcarinal station demonstrating mild hypermetabolism (SUV 3.6). There was more peripheral left lower lobe increased atelectasis with mucoid impaction which is likely secondary to endobronchial obstruction.  There was a small left pleural effusion.  Head MRI on 07/07/2016 revealed no evidence  of metastatic disease.  She received radiation from 07/22/2016 - 09/19/2016.  She received 5 weeks of concurrent carboplatin and Taxol (07/24/2016 - 08/11/2016; 09/05/2016 - 09/16/2016). She requires a reduced dose of Benadryl (25 mg) for her premedication.  Week #3 was postponed secondary to chest pain and evaluation.  She missed a couple of weeks.  Chest CT on 11/21/2016 revealed progressive collapse of the LEFT lower lobe presumably related to central obstructing mass.  Mass obstructed the LEFT lower lobe bronchus, although mass was not well demonstrated.  There were small effusions (no change).  There was centrilobular emphysema unchanged.  There was no evidence pneumonia.  PET scan on 01/27/2017 revealed interval response to therapy.  The previously noted hypermetabolism associated with left lower lobe perihilar lung mass has resolved in the interval.  There was a moderate left pleural effusion that had increased in volume from previous PET-CT and there was now complete atelectasis/consolidation of the left lower lobe, which may obscure residual mass.  There was no new sites of hypermetabolism or evidence of distant metastatic disease.  Bronchoscopy on 02/09/2017 revealed no evidence of endobronchial tumor.  There was stenosis at the entry  to the left lower posterior segments which could not be entered.  There was mildly erythematous mucosa with copious secretions in the left lower lobe.  Brushings were positive for non-small cell carcinoma, favor squamous cell carcinoma.  Ultrasound guided thoracentesis on 02/17/2017 revealed was negative for malignancy.  CEA was 7.9 on 04/09/2017.  Bone scan on 02/27/2017 revealed a subtle focus of uptake in the left 7th rib, indeterminate.  Bone scan on 06/04/2017 revealed abnormal radiotracer uptake at T7 and T8 at the sites of recent kyphoplasty procedures. There was stable increased uptake in the mid lower lumbar regions, likely of arthropathic etiology.  Elsewhere, the distribution of uptake is unremarkable.  She is s/p cycle #3 carboplatin, Abraxane and pembrolizumab (03/19/2017 - 05/08/2017).  Cycle #2 was truncated secondary to pneumonia. Cycle #4 was postponed on 06/04/2017 due to severe pain.    Chest CT angiogram on 04/30/2017 revealed no pulmonary embolism.  There was a new wedge compression fracture of T7.  She has chronic atelectasis of the left lower lobe and left sided effusion.  Thoracic spine MRI on 05/15/2017 revealed severe benign-appearing subacute compression fracture of T7 without neural impingement.  There was acute new compression fracture of the superior endplate of T8.  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  She underwent T7 kyphoplasty on 05/22/2017.    Chest CT on 06/18/2017 demonstrated left lower lobe collapse/consolidation extending into the inferior left hilum (stable).  She has a normocytic anemia likely due to chemotherapy.  Work-up on 07/24/2016 revealed the following normal labs: ferritin (103), B12 (345), folate(19.7).  Iron saturation was 8% and TIBC was 258.  She has B12 deficiency.  B12 was 254 on 04/16/2017 and MMA 789 (high) on 04/23/2017.  She began B12 on 04/30/2016 (last 06/25/2017).  She has a history of stage IA left breast cancer in 02/2005.  She underwent lumpectomy.  Pathology revealed a T1cN0 lesion.  Tumor was ER + , PR +, and Her2/neu -.  Two sentinel lymph nodes were negative.  She received chemotherapy (4 cycles of AC and possibly an abbreviated course of Taxol- no records available).  She received radiation.  She completed Femara in 08/2010.  She has fibromyalgia.  She has advanced thoracoabdominal aortic atherosclerosis with fusiform dilatation of the descending thoracic aorta up to 4.3 cm.  She is followed by Dr. Lucky Cowboy.  She has a history of recurrent UTIs.  UA on 03/19/2017 revealed  >100,000 CFU/mL of an ESBL  producing E. coli.  She was treated with fosfomycin 3 g x 1 dose.  Urine culture on 05/15/2017 revealed ESBL producing E coli.  Sputum culture on 05/15/2017 revealed Corynebacterium striatum.  She was admitted to [facility] from 06/30/2017 - 54/27/0623 for complicated ESBL producing E.coli UTI and bacteremia. She received IV meropenem.  ID did not recommend port removal. She was discharged home on a 7 day course of intravenous Invanz.  She was admitted to [facility] from 07/17/2017 - 07/20/2017 with chronic respiratory failure with hypoxia, health care associated pneumonia, and sepsis.  She presented with altered mental status and hypoxia due to left-sided pneumonia. She received IV vancomycin, cefepime.  She was discharged with Levaquin.  She is allergic to contrast dye and requires premedications.  Symptomatically, she is fatigued.  She has chronic left sided rib pain.  She needs assistance to ambulate.  She has ongoing weight loss (12 pounds).  Plan: 1. Labs today: CBC with diff, CMP, Mg  2. Metastatic lung cancer - progressing  Review progression of extensive metastatic disease. Patient continues to decline overall.   Oncology diagnosis is complicated by end stage COPD. Patient continues to require supplemental oxygen at 4L/[place] ATC.  Patient now advising that she does not want hospice, rather she would like to pursue further treatment. Discuss further treatment option using single agent gemcitabine. Review concerns for tolerance. Discussed that she will have to get stronger prior to reinitiation of therapy.  3. Rib pain - acute exacerbation  Pain has increased again. Scheduled to see orthopedics again in consult, however she notes that her pain is "bad again". Would like to reschedule and have ordered MRI imaging.  4. Fatigue - chronic  Review multifactorial etiology (lung cancer, COPD, anemia of chronic disease, malnutrition, and vitamin B12 deficiency).   Patient remains markedly fatigued despite  "resting" all day and maintaining a virtually sedentary lifestyle due to her overall poor health state. 5. B12 deficiency - stable  Continues on monthly parenteral supplementation at this point.  Discussed that B12 injections may be given at home if she would prefer in order to prevent multiple trips to the cancer center.   Patient declines in home administration, and opts to continue injections in the cancer center. 6. Protein calorie malnutrition - progressive  Patient continues to lose weight; weight down 12 pounds.  Her weight today is 96 lb 2 oz (43.6 kg); BMI is 18.16 kg/m.   Patient encouraged to increase her intake of calorie and protein dense food choices. Additionally, patient was encouraged to utilize nutritional supplement shakes at least 2-3 times a day.    Continues to refuse appetite stimulants as offered by clinical team. Patient states, "I am hungry now. I don't think I need it".  7. Pain and symptom management - needs met Patient has home oxygen, antiemetics, and pain medications at home to use on a PRN basis. Patient advising that the prescribed interventions are adequate at this point.  CHANGE Pain: Duragesic 50 mcg patches, with Roxicodone 5 mg q4h PRN for breakthrough. Nausea: ondansetron 8 mg q8h PRN, lorazepam 0.5 mg q6h PRN Oxygen: 4L/[place] around the clock  8. Goals of care - ongoing  Discuss previous referral to outpatient hospice. Patient declined hospice admission.  Readdress advanced directives. Patient does not wish to have intubation or chest compressions. Confirmed DNR/DNI. 9. RTC in 1 week for MD assessment and labs (CBC with diff, CMP).   Honor Loh, NP  08/24/17, 11:40 AM  I saw and evaluated the patient, participating in the key portions of the service and reviewing pertinent diagnostic studies and records.  I reviewed the nurse practitioner's note and agree with the findings and the plan.  The assessment and plan were discussed with the patient.   Multiple questions were asked by the patient and answered.   Nolon Stalls, MD 08/24/17, 11:40 AM

## 2017-08-24 ENCOUNTER — Inpatient Hospital Stay: Payer: Medicare HMO | Attending: Hematology and Oncology

## 2017-08-24 ENCOUNTER — Encounter: Payer: Self-pay | Admitting: Hematology and Oncology

## 2017-08-24 ENCOUNTER — Other Ambulatory Visit: Payer: Self-pay | Admitting: *Deleted

## 2017-08-24 ENCOUNTER — Inpatient Hospital Stay (HOSPITAL_BASED_OUTPATIENT_CLINIC_OR_DEPARTMENT_OTHER): Payer: Medicare HMO | Admitting: Hematology and Oncology

## 2017-08-24 ENCOUNTER — Telehealth: Payer: Self-pay | Admitting: *Deleted

## 2017-08-24 VITALS — BP 107/74 | HR 101 | Temp 94.2°F | Resp 18 | Wt 96.1 lb

## 2017-08-24 DIAGNOSIS — E538 Deficiency of other specified B group vitamins: Secondary | ICD-10-CM

## 2017-08-24 DIAGNOSIS — F17210 Nicotine dependence, cigarettes, uncomplicated: Secondary | ICD-10-CM

## 2017-08-24 DIAGNOSIS — Z7189 Other specified counseling: Secondary | ICD-10-CM

## 2017-08-24 DIAGNOSIS — Z8719 Personal history of other diseases of the digestive system: Secondary | ICD-10-CM

## 2017-08-24 DIAGNOSIS — D638 Anemia in other chronic diseases classified elsewhere: Secondary | ICD-10-CM

## 2017-08-24 DIAGNOSIS — R530 Neoplastic (malignant) related fatigue: Secondary | ICD-10-CM

## 2017-08-24 DIAGNOSIS — Z9981 Dependence on supplemental oxygen: Secondary | ICD-10-CM

## 2017-08-24 DIAGNOSIS — Z79899 Other long term (current) drug therapy: Secondary | ICD-10-CM | POA: Diagnosis not present

## 2017-08-24 DIAGNOSIS — E46 Unspecified protein-calorie malnutrition: Secondary | ICD-10-CM | POA: Diagnosis not present

## 2017-08-24 DIAGNOSIS — J449 Chronic obstructive pulmonary disease, unspecified: Secondary | ICD-10-CM

## 2017-08-24 DIAGNOSIS — C3492 Malignant neoplasm of unspecified part of left bronchus or lung: Secondary | ICD-10-CM

## 2017-08-24 DIAGNOSIS — R110 Nausea: Secondary | ICD-10-CM | POA: Insufficient documentation

## 2017-08-24 DIAGNOSIS — G893 Neoplasm related pain (acute) (chronic): Secondary | ICD-10-CM

## 2017-08-24 DIAGNOSIS — C3432 Malignant neoplasm of lower lobe, left bronchus or lung: Secondary | ICD-10-CM

## 2017-08-24 LAB — CBC WITH DIFFERENTIAL/PLATELET
Basophils Absolute: 0.1 10*3/uL (ref 0–0.1)
Basophils Relative: 1 %
Eosinophils Absolute: 0.3 10*3/uL (ref 0–0.7)
Eosinophils Relative: 4 %
HCT: 42.1 % (ref 35.0–47.0)
Hemoglobin: 13.9 g/dL (ref 12.0–16.0)
Lymphocytes Relative: 14 %
Lymphs Abs: 1.1 10*3/uL (ref 1.0–3.6)
MCH: 30.1 pg (ref 26.0–34.0)
MCHC: 33.1 g/dL (ref 32.0–36.0)
MCV: 90.9 fL (ref 80.0–100.0)
Monocytes Absolute: 0.6 10*3/uL (ref 0.2–0.9)
Monocytes Relative: 8 %
Neutro Abs: 5.6 10*3/uL (ref 1.4–6.5)
Neutrophils Relative %: 73 %
Platelets: 218 10*3/uL (ref 150–440)
RBC: 4.63 MIL/uL (ref 3.80–5.20)
RDW: 16.6 % — ABNORMAL HIGH (ref 11.5–14.5)
WBC: 7.7 10*3/uL (ref 3.6–11.0)

## 2017-08-24 LAB — COMPREHENSIVE METABOLIC PANEL
ALT: 8 U/L (ref 0–44)
AST: 20 U/L (ref 15–41)
Albumin: 3.4 g/dL — ABNORMAL LOW (ref 3.5–5.0)
Alkaline Phosphatase: 115 U/L (ref 38–126)
Anion gap: 14 (ref 5–15)
BUN: 16 mg/dL (ref 8–23)
CO2: 25 mmol/L (ref 22–32)
Calcium: 8.8 mg/dL — ABNORMAL LOW (ref 8.9–10.3)
Chloride: 100 mmol/L (ref 98–111)
Creatinine, Ser: 1.32 mg/dL — ABNORMAL HIGH (ref 0.44–1.00)
GFR calc Af Amer: 45 mL/min — ABNORMAL LOW (ref >60)
GFR calc non Af Amer: 39 mL/min — ABNORMAL LOW (ref >60)
Glucose, Bld: 106 mg/dL — ABNORMAL HIGH (ref 70–99)
Potassium: 3.1 mmol/L — ABNORMAL LOW (ref 3.5–5.1)
Sodium: 139 mmol/L (ref 135–145)
Total Bilirubin: 0.5 mg/dL (ref 0.3–1.2)
Total Protein: 7 g/dL (ref 6.5–8.1)

## 2017-08-24 LAB — MAGNESIUM: Magnesium: 1.9 mg/dL (ref 1.7–2.4)

## 2017-08-24 MED ORDER — HYDROCODONE-ACETAMINOPHEN 5-325 MG PO TABS: 1.0000 | ORAL_TABLET | Freq: Three times a day (TID) | ORAL | 0 refills | Status: DC | PRN

## 2017-08-24 MED ORDER — POTASSIUM CHLORIDE CRYS ER 20 MEQ PO TBCR: 20.0000 meq | EXTENDED_RELEASE_TABLET | Freq: Every day | ORAL | 0 refills | Status: DC

## 2017-08-24 MED ORDER — FENTANYL 50 MCG/HR TD PT72: 50.0000 ug | MEDICATED_PATCH | TRANSDERMAL | 0 refills | Status: AC

## 2017-08-24 NOTE — Telephone Encounter (Signed)
Called patient to inform her that her K+ is low, most likely because of poor dietary intake over the past 2 weeks.  Asked if she would be willing to take K+ pills for a week to get the level up.  She states she will.  She also asked about Fentanyl patches.  She states when she got home she had only one and she put it on.  Spoke to [place], NP who will also sent rx for these as well as K+.

## 2017-08-24 NOTE — Telephone Encounter (Signed)
-----   Message from [name] Kitchens, NP sent at 08/24/2017  1:50 PM EDT ----- Regarding: K+ low K+ low at 3.1. Likely related to poor diet. Would she be willing to take replacement for a week? If not, needs to kick up dietary intake of K+ foods, which I am not sure is possible for her seeing whereas she has lost 12 pounds since last visit.   [name] [name]

## 2017-08-24 NOTE — Progress Notes (Signed)
Patient has lost 12 lbs since last visit.  States she was unable to eat for about 2 weeks.  She is no longer able to drink her milkshakes - does not drink any supplemental drinks.

## 2017-08-27 ENCOUNTER — Ambulatory Visit: Payer: Medicare HMO

## 2017-09-01 ENCOUNTER — Inpatient Hospital Stay: Payer: Medicare HMO | Admitting: Hematology and Oncology

## 2017-09-01 ENCOUNTER — Inpatient Hospital Stay: Payer: Medicare HMO

## 2017-09-01 NOTE — Progress Notes (Deleted)
[facility] day:  09/01/2017     Chief Complaint: [name] [name] is a 77 y.o. 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  HPI: The patient was last seen in the medical oncology clinic on 08/24/2017.  At that time, she noted progressive weakness.  She was unable to complete her ADLs independently.  She had caregivers at home.  Pain was poorly controlled. She had lost weight.  At last visit, she discussed the desire to receive treatment if she got stronger.  We discussed single agent gemcitabine. We discussed nutritional supplements.  She declined an appetite stimulant.  We discussed management of her pain.  She was to retry Fentanyl patches with q 4 hours prn oxycodone.  During the interim,     Past Medical History:  Diagnosis Date  . Asthma   . Breast cancer ([place]) 2009   left  . Cancer ([place])   . CHF (congestive heart failure) ([place])   . CHF (congestive heart failure) ([place])   . Chronic UTI   . COPD (chronic obstructive pulmonary disease) ([place])   . Dizziness   . Fibromyalgia   . Hypertension   . Neuropathy   . Personal history of tobacco use, presenting hazards to health 05/17/2015  . Polyp, larynx   . RA (rheumatoid arthritis) ([place])   . Sinus infection    recent  . Stumbling gait    to the left  . Supplemental oxygen dependent    2.5l    Past Surgical History:  Procedure Laterality Date  . ABDOMINAL HYSTERECTOMY    . BREAST LUMPECTOMY Left 2009   chemo and radiation  . CYST EXCISION Left 02/27/2015   Procedure: CYST REMOVAL;  Surgeon: Hessie Knows, MD;  Location: [facility] ORS;  Service: Orthopedics;  Laterality: Left;  . EYE MUSCLE SURGERY Right    13 surgeries  . FLEXIBLE BRONCHOSCOPY N/A 07/01/2016   Procedure: FLEXIBLE BRONCHOSCOPY;  Surgeon: Wilhelmina Mcardle, MD;  Location: [facility] OR[facility];  Service: Pulmonary;  Laterality: N/A;  . FLEXIBLE BRONCHOSCOPY N/A  02/09/2017   Procedure: FLEXIBLE BRONCHOSCOPY;  Surgeon: [name], MD;  Location: [facility] OR[facility];  Service: Pulmonary;  Laterality: N/A;  . KYPHOPLASTY N/A 05/22/2017   Procedure: Celine Ahr;  Surgeon: Hessie Knows, MD;  Location: [facility] OR[facility];  Service: Orthopedics;  Laterality: N/A;  . PORTA CATH INSERTION N/A 03/04/2017   Procedure: PORTA CATH INSERTION;  Surgeon: [name], MD;  Location: [facility];  Service: Cardiovascular;  Laterality: N/A;  . THUMB ARTHROSCOPY Left     Family History  Problem Relation Age of Onset  . Diabetes Father   . Stroke Father   . Heart attack Father   . CAD Sister     Social History:  reports that she has been smoking cigarettes. She has a 40.00 pack-year smoking history. She has never used smokeless tobacco. She reports that she does not drink alcohol or use drugs.  He lives with her son and grandson. Her grandson is in the waiting room.  The patient is alone today.   Allergies:  Allergies  Allergen Reactions  . Contrast Media [Iodinated Diagnostic Agents] Shortness Of Breath    Acute onset of shortness of breath following CT contrast administration 06/18/17. Sent to the ED. Dr. Kerman Passey documented incident as an allergic reaction. Needs premedications prior to future contrast media injections. Previous questionable contrast allergy, so the patient was  given entire or partial premedications without complications. She was not given any premedications on 06/18/17, resulting in shortness of breath and an ED visit.  . Sulfa Antibiotics Other (See Comments)    Unknown  . Tetracycline Hives  . White Petrolatum Other (See Comments)    Blisters  . Amoxicillin-Pot Clavulanate Rash and Other (See Comments)    Blisters in mouth Has patient had a PCN reaction causing immediate rash, facial/tongue/throat swelling, SOB or lightheadedness with hypotension: No Has patient had a PCN reaction causing severe rash involving mucus membranes or skin  necrosis: No Has patient had a PCN reaction that required hospitalization: No Has patient had a PCN reaction occurring within the last 10 years: No If all of the above answers are "NO", then may proceed with Cephalosporin use.   . Tape Rash    Current Medications: Current Outpatient Medications  Medication Sig Dispense Refill  . albuterol (PROVENTIL HFA;VENTOLIN HFA) 108 (90 Base) MCG/ACT inhaler Inhale 1-2 puffs into the lungs every 6 (six) hours as needed for wheezing or shortness of breath. (Patient taking differently: Inhale 2 puffs into the lungs every 6 (six) hours as needed for wheezing or shortness of breath. ) 1 Inhaler 2  . clonazePAM (KLONOPIN) 1 MG tablet Take 1 mg by mouth daily as needed for anxiety.     . fentaNYL (DURAGESIC - DOSED MCG/HR) 50 MCG/HR Place 1 patch (50 mcg total) onto the skin every 3 (three) days. 5 patch 0  . gabapentin (NEURONTIN) 600 MG tablet Take 600 mg by mouth 3 (three) times daily.     [name] HYDROcodone-acetaminophen (NORCO/VICODIN) 5-325 MG tablet Take 1 tablet by mouth 3 (three) times daily as needed. 30 tablet 0  . LORazepam (ATIVAN) 0.5 MG tablet Take 1 tablet (0.5 mg total) by mouth every 6 (six) hours as needed (Nausea or vomiting). 30 tablet 0  . nitroGLYCERIN (NITROSTAT) 0.4 MG SL tablet Place 0.4 mg under the tongue every 5 (five) minutes as needed for chest pain.     [name] ondansetron (ZOFRAN) 8 MG tablet Take 1 tablet (8 mg total) by mouth 2 (two) times daily as needed. Start on the third day after chemotherapy. (Patient taking differently: Take 8 mg by mouth 2 (two) times daily as needed for nausea or vomiting. Start on the third day after chemotherapy.) 30 tablet 1  . potassium chloride SA (K-DUR,KLOR-CON) 20 MEQ tablet Take 1 tablet (20 mEq total) by mouth daily for 5 days. 5 tablet 0  . predniSONE (STERAPRED UNI-PAK 21 TAB) 10 MG (21) TBPK tablet Taper by 10 mg p.o. daily 21 tablet 0  . zolpidem (AMBIEN) 5 MG tablet Take 5 mg by mouth at bedtime as  needed for sleep.      No current facility-administered medications for this visit.    Facility-Administered Medications Ordered in Other Visits  Medication Dose Route Frequency Provider Last Rate Last Dose  . cyanocobalamin ((VITAMIN B-12)) injection 1,000 mcg  1,000 mcg Intramuscular Once Honor Loh E, NP      . heparin lock flush 100 unit/mL  500 Units Intravenous Once Lequita Asal, MD        Review of Systems  Constitutional: Negative for diaphoresis, fever, malaise/fatigue and weight loss.       (+) deconditioning and progressive debility. Sits in recliner all day and "rests".  HENT: Negative.   Eyes: Negative.   Respiratory: Positive for shortness of breath (related to end stage COPD). Negative for cough (chronic), hemoptysis and sputum production.  Supplemental oxygen use at 4L/[place] ATC  Cardiovascular: Positive for orthopnea. Negative for chest pain, palpitations, leg swelling and PND.  Gastrointestinal: Negative for abdominal pain, blood in stool, constipation, diarrhea, melena, nausea and vomiting.  Genitourinary: Negative for dysuria, frequency, hematuria and urgency.       Recurrent ESBL producing UTIs  Musculoskeletal: Positive for back pain and joint pain. Negative for falls and myalgias.  Skin: Negative for itching and rash.       Pressure related area of compromised skin integrity to sacrum.  Neurological: Positive for weakness (progressive). Negative for dizziness, tremors and headaches.  Endo/Heme/Allergies: Does not bruise/bleed easily.  Psychiatric/Behavioral: Negative for depression, memory loss and suicidal ideas. The patient is not nervous/anxious and does not have insomnia.   All other systems reviewed and are negative.  Performance status (ECOG): 3 - Symptomatic, >50% confined to bed  Vital Signs There were no vitals taken for this visit.  Physical Exam  Constitutional: She is oriented to person, place, and time. She appears malnourished. She  appears unhealthy. She appears cachectic.  HENT:  Head: Normocephalic and atraumatic.  Eyes: Pupils are equal, round, and reactive to light. EOM are normal. No scleral icterus.  Blue eyes  Neck: Normal range of motion. Neck supple. No tracheal deviation present. No thyromegaly present.  Cardiovascular: Normal rate, regular rhythm and normal heart sounds. Exam reveals no gallop and no friction rub.  No murmur heard. Pulmonary/Chest: Effort normal and breath sounds normal. No respiratory distress. She has no wheezes. She has no rales.  Supplemental oxygen in place via [place].  Abdominal: Soft. Bowel sounds are normal. She exhibits no distension. There is no tenderness.  Musculoskeletal: Normal range of motion. She exhibits no edema or tenderness.  Lymphadenopathy:    She has no cervical adenopathy.    She has no axillary adenopathy.       Right: No inguinal and no supraclavicular adenopathy present.       Left: No inguinal and no supraclavicular adenopathy present.  Neurological: She is alert and oriented to person, place, and time.  Skin: Skin is warm and dry. No rash noted. No erythema.  Psychiatric: Mood, affect and judgment normal.  Nursing note and vitals reviewed.   No visits with results within 3 Day(s) from this visit.  Latest known visit with results is:  Appointment on 08/24/2017  Component Date Value Ref Range Status  . Magnesium 08/24/2017 1.9  1.7 - 2.4 mg/dL Final   Performed at [facility], [address] [place], [place] [address]  . Sodium 08/24/2017 139  135 - 145 mmol/L Final  . Potassium 08/24/2017 3.1* 3.5 - 5.1 mmol/L Final  . Chloride 08/24/2017 100  98 - 111 mmol/L Final  . CO2 08/24/2017 25  22 - 32 mmol/L Final  . Glucose, Bld 08/24/2017 106* 70 - 99 mg/dL Final  . BUN 08/24/2017 16  8 - 23 mg/dL Final  . Creatinine, Ser 08/24/2017 1.32* 0.44 - 1.00 mg/dL Final  . Calcium 08/24/2017 8.8* 8.9 - 10.3 mg/dL Final  . Total Protein 08/24/2017 7.0  6.5 -  8.1 g/dL Final  . Albumin 08/24/2017 3.4* 3.5 - 5.0 g/dL Final  . AST 08/24/2017 20  15 - 41 U/L Final  . ALT 08/24/2017 8  0 - 44 U/L Final  . Alkaline Phosphatase 08/24/2017 115  38 - 126 U/L Final  . Total Bilirubin 08/24/2017 0.5  0.3 - 1.2 mg/dL Final  . GFR calc non Af Amer 08/24/2017 39* >60  mL/min Final  . GFR calc Af Amer 08/24/2017 45* >60 mL/min Final   Comment: (NOTE) The eGFR has been calculated using the CKD EPI equation. This calculation has not been validated in all clinical situations. eGFR's persistently <60 mL/min signify possible Chronic Kidney Disease.   Georgiann Hahn gap 08/24/2017 14  5 - 15 Final   Performed at [facility], [address] [place], [place] [address]  . WBC 08/24/2017 7.7  3.6 - 11.0 K/uL Final  . RBC 08/24/2017 4.63  3.80 - 5.20 MIL/uL Final  . Hemoglobin 08/24/2017 13.9  12.0 - 16.0 g/dL Final  . HCT 08/24/2017 42.1  35.0 - 47.0 % Final  . MCV 08/24/2017 90.9  80.0 - 100.0 fL Final  . MCH 08/24/2017 30.1  26.0 - 34.0 pg Final  . MCHC 08/24/2017 33.1  32.0 - 36.0 g/dL Final  . RDW 08/24/2017 16.6* 11.5 - 14.5 % Final  . Platelets 08/24/2017 218  150 - 440 K/uL Final  . Neutrophils Relative % 08/24/2017 73  % Final  . Neutro Abs 08/24/2017 5.6  1.4 - 6.5 K/uL Final  . Lymphocytes Relative 08/24/2017 14  % Final  . Lymphs Abs 08/24/2017 1.1  1.0 - 3.6 K/uL Final  . Monocytes Relative 08/24/2017 8  % Final  . Monocytes Absolute 08/24/2017 0.6  0.2 - 0.9 K/uL Final  . Eosinophils Relative 08/24/2017 4  % Final  . Eosinophils Absolute 08/24/2017 0.3  0 - 0.7 K/uL Final  . Basophils Relative 08/24/2017 1  % Final  . Basophils Absolute 08/24/2017 0.1  0 - 0.1 K/uL Final   Performed at [facility], [address] [place], [place] [address]    Assessment:  [name] [name] is a 77 y.o. female with presumed metastatic squamous cell lung cancer.  She presented with clinical [id number] squamous cell lung cancer of the left lung s/p bronchoscopy  and biopsy on 07/01/2016.  She has a 40 pack year smoking history.  She presented with left lower chest wall pain.  Foundation One testing on 03/13/2017 revealed no reportable alterations in EGFR, RET, ALK, MET, ERBB2, BRAF, and ROS1.    Tumor was MS-stable.  TMB was 3 Muts/Mb.  There was CDKN2A loss, CDKN2B loss, KRAS G12V, MTAP loss, and TP53 R282W.  PD-L1 was 5%.  Chest CT with contrast on 06/11/2016 revealed a 3.2 x 3.0 mass like area of focal opacity in the medial left lower lobe with obliteration of segmental airways to the anterior left lower lobe.  PET scan on 06/27/2016 revealed a 4.3 x 3.2 cm central left lower lobe lung lesion (SUV 14.3) consistent with primary bronchogenic carcinoma.  There was equivocal nodal tissue in the subcarinal station demonstrating mild hypermetabolism (SUV 3.6). There was more peripheral left lower lobe increased atelectasis with mucoid impaction which is likely secondary to endobronchial obstruction.  There was a small left pleural effusion.  Head MRI on 07/07/2016 revealed no evidence of metastatic disease.  She received radiation from 07/22/2016 - 09/19/2016.  She received 5 weeks of concurrent carboplatin and Taxol (07/24/2016 - 08/11/2016; 09/05/2016 - 09/16/2016). She requires a reduced dose of Benadryl (25 mg) for her premedication.  Week #3 was postponed secondary to chest pain and evaluation.  She missed a couple of weeks.  Chest CT on 11/21/2016 revealed progressive collapse of the LEFT lower lobe presumably related to central obstructing mass.  Mass obstructed the LEFT lower lobe bronchus, although mass was not well demonstrated.  There were small  effusions (no change).  There was centrilobular emphysema unchanged.  There was no evidence pneumonia.  PET scan on 01/27/2017 revealed interval response to therapy.  The previously noted hypermetabolism associated with left lower lobe perihilar lung mass has resolved in the interval.  There was a moderate left  pleural effusion that had increased in volume from previous PET-CT and there was now complete atelectasis/consolidation of the left lower lobe, which may obscure residual mass.  There was no new sites of hypermetabolism or evidence of distant metastatic disease.  Bronchoscopy on 02/09/2017 revealed no evidence of endobronchial tumor.  There was stenosis at the entry to the left lower posterior segments which could not be entered.  There was mildly erythematous mucosa with copious secretions in the left lower lobe.  Brushings were positive for non-small cell carcinoma, favor squamous cell carcinoma.  Ultrasound guided thoracentesis on 02/17/2017 revealed was negative for malignancy.  CEA was 7.9 on 04/09/2017.  Bone scan on 02/27/2017 revealed a subtle focus of uptake in the left 7th rib, indeterminate.  Bone scan on 06/04/2017 revealed abnormal radiotracer uptake at T7 and T8 at the sites of recent kyphoplasty procedures. There was stable increased uptake in the mid lower lumbar regions, likely of arthropathic etiology. Elsewhere, the distribution of uptake is unremarkable.  She is  s/p cycle #3 carboplatin, Abraxane and pembrolizumab (03/19/2017 - 05/08/2017).  Cycle #2 was truncated secondary to pneumonia. Cycle #4 was postponed on 06/04/2017 due to severe pain.    Chest CT angiogram on 04/30/2017 revealed no pulmonary embolism.  There was a new wedge compression fracture of T7.  She has chronic atelectasis of the left lower lobe and left sided effusion.  Thoracic spine MRI on 05/15/2017 revealed severe benign-appearing subacute compression fracture of T7 without neural impingement.  There was acute new compression fracture of the superior endplate of T8.  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  She underwent T7 kyphoplasty on 05/22/2017.    Chest CT on 06/18/2017 demonstrated left lower lobe collapse/consolidation  extending into the inferior left hilum (stable).  She has a normocytic anemia likely due to chemotherapy.  Work-up on 07/24/2016 revealed the following normal labs: ferritin (103), B12 (345), folate(19.7).  Iron saturation was 8% and TIBC was 258.  She has B12 deficiency.  B12 was 254 on 04/16/2017 and MMA 789 (high) on 04/23/2017.  She began B12 on 04/30/2016 (last 06/25/2017).  She has a history of stage IA left breast cancer in 02/2005.  She underwent lumpectomy.  Pathology revealed a T1cN0 lesion.  Tumor was ER + , PR +, and Her2/neu -.  Two sentinel lymph nodes were negative.  She received chemotherapy (4 cycles of AC and possibly an abbreviated course of Taxol- no records available).  She received radiation.  She completed Femara in 08/2010.  She has fibromyalgia.  She has advanced thoracoabdominal aortic atherosclerosis with fusiform dilatation of the descending thoracic aorta up to 4.3 cm.  She is followed by Dr. Lucky Cowboy.  She has a history of recurrent UTIs.  UA on 03/19/2017 revealed  >100,000 CFU/mL of an ESBL producing E. coli.  She was treated with fosfomycin 3 g x 1 dose.  Urine culture on 05/15/2017 revealed ESBL producing E coli.  Sputum culture on 05/15/2017 revealed Corynebacterium striatum.  She was admitted to [facility] from 06/30/2017 - 37/90/2409 for complicated ESBL producing E.coli UTI and bacteremia. She received IV meropenem.  ID did not recommend  port removal. She was discharged home on a 7 day course of intravenous Invanz.  She was admitted to [facility] from 07/17/2017 - 07/20/2017 with chronic respiratory failure with hypoxia, health care associated pneumonia, and sepsis.  She presented with altered mental status and hypoxia due to left-sided pneumonia. She received IV vancomycin, cefepime.  She was discharged with Levaquin.  She is allergic to contrast dye and requires premedications.  Code status is DNR/DNI.  Symptomatically,   Plan:  1. Labs today:  CBC with diff,  CMP. 2. Metastatic lung cancer - progressing  As per discretion of extensive metastatic disease.  Patient with progressive deconditioning and overall decline in performance status.  Lung cancer diagnosis is complicated by concurrent end-stage COPD diagnosis.  Patient requires supplemental oxygen at 4L/[place] ATC.  Goals of care have been discussed with patient extensively.  She initially agreed to hospice services, however now she is requesting further treatment for her static disease.  Review consideration of single agent gemcitabine, however stressed that there is concern that she will be unable to tolerate the regimen.  Extensive discussion held regarding patient's overall performance status (ECOG: 3 - Symptomatic, >50% confined to bed). She is aware that she will have to become stronger overall in order to initiate further chemotherapy treatments. 3. Rib pain - acute exacerbation  Discuss rib pain and follow-up consult with orthopedics.   4. Fatigue - chronic  Review multifactorial etiology (lung cancer, COPD, anemia of chronic disease, malnutrition, and B12 deficiency).  Patient remains markedly fatigued despite "resting" all day and maintaining a virtually sedentary lifestyle due to her overall decline in performance status. 5. B12 deficiency - stable  Continues on monthly parenteral supplementation at this point. 6. Protein calorie malnutrition - progressive  Discuss protein calorie malnutrition. Patient continues to lose weight. Her weight today is  . Her BMI of   places her in the XXX weight category. Encouraged her to increase her intake of calorie and protein dense food choices. Additionally, patient was encouraged to utilize nutritional supplement shakes at least 2-3 times a day.   Readdressed need for potential use pharmacological appetite stimulation, however patient continues to refuse.  7. Pain and symptom management - need met  She has home oxygen, antiemetics, and pain  medications to use on a as needed basis.  She advises that the prescribed interventions are adequate at this point.  Pain: Duragesic 50 mcg patches with Roxicodone 5 mg every 4 hours as needed for breakthrough pain.  Nausea: Ondansetron 8 mg every 8 hours as needed, lorazepam 0.5 mg every 6 as needed  Oxygen: 4L/[place] ATC. 8. Goals of care discussion - ongoing  Discussed previous referral to outpatient hospice services due to progression of metastatic disease and decline in performance status.  Patient refusing at this point citing that she would like to pursue further treatment.  Readdress advanced directives.  CODE STATUS confirmed as DNR/DNI. 9. RTC   Lequita Asal, MD  09/01/17, 4:24 AM  I saw and evaluated the patient, participating in the key portions of the service and reviewing pertinent diagnostic studies and records.  I reviewed the nurse practitioner's note and agree with the findings and the plan.  The assessment and plan were discussed with the patient.  Multiple questions were asked by the patient and answered.   Nolon Stalls, MD 09/01/17, 4:24 AM

## 2017-09-02 ENCOUNTER — Other Ambulatory Visit: Payer: Self-pay | Admitting: *Deleted

## 2017-09-02 NOTE — Telephone Encounter (Signed)
Too early for refill  

## 2017-09-03 ENCOUNTER — Inpatient Hospital Stay: Payer: Medicare HMO

## 2017-09-10 ENCOUNTER — Other Ambulatory Visit: Payer: Self-pay | Admitting: Internal Medicine

## 2017-09-13 ENCOUNTER — Emergency Department: Payer: Medicare HMO

## 2017-09-13 ENCOUNTER — Encounter: Payer: Self-pay | Admitting: Hematology and Oncology

## 2017-09-13 ENCOUNTER — Emergency Department
Admission: EM | Admit: 2017-09-13 | Discharge: 2017-09-13 | Disposition: A | Discharge status: Home / Self Care | Payer: Medicare HMO | Attending: Emergency Medicine | Admitting: Emergency Medicine

## 2017-09-13 ENCOUNTER — Encounter: Payer: Self-pay | Admitting: Emergency Medicine

## 2017-09-13 ENCOUNTER — Other Ambulatory Visit: Payer: Self-pay

## 2017-09-13 DIAGNOSIS — Z79899 Other long term (current) drug therapy: Secondary | ICD-10-CM | POA: Diagnosis not present

## 2017-09-13 DIAGNOSIS — R296 Repeated falls: Secondary | ICD-10-CM | POA: Diagnosis not present

## 2017-09-13 DIAGNOSIS — R51 Headache: Secondary | ICD-10-CM | POA: Insufficient documentation

## 2017-09-13 DIAGNOSIS — S199XXA Unspecified injury of neck, initial encounter: Secondary | ICD-10-CM | POA: Diagnosis not present

## 2017-09-13 DIAGNOSIS — Z853 Personal history of malignant neoplasm of breast: Secondary | ICD-10-CM | POA: Insufficient documentation

## 2017-09-13 DIAGNOSIS — W19XXXA Unspecified fall, initial encounter: Secondary | ICD-10-CM

## 2017-09-13 DIAGNOSIS — R0602 Shortness of breath: Secondary | ICD-10-CM | POA: Diagnosis not present

## 2017-09-13 DIAGNOSIS — N3090 Cystitis, unspecified without hematuria: Secondary | ICD-10-CM | POA: Diagnosis not present

## 2017-09-13 DIAGNOSIS — I5022 Chronic systolic (congestive) heart failure: Secondary | ICD-10-CM | POA: Diagnosis not present

## 2017-09-13 DIAGNOSIS — I2510 Atherosclerotic heart disease of native coronary artery without angina pectoris: Secondary | ICD-10-CM | POA: Insufficient documentation

## 2017-09-13 DIAGNOSIS — I110 Hypertensive heart disease with heart failure: Secondary | ICD-10-CM | POA: Insufficient documentation

## 2017-09-13 DIAGNOSIS — Y92009 Unspecified place in unspecified non-institutional (private) residence as the place of occurrence of the external cause: Secondary | ICD-10-CM

## 2017-09-13 DIAGNOSIS — F17210 Nicotine dependence, cigarettes, uncomplicated: Secondary | ICD-10-CM | POA: Insufficient documentation

## 2017-09-13 DIAGNOSIS — W0110XA Fall on same level from slipping, tripping and stumbling with subsequent striking against unspecified object, initial encounter: Secondary | ICD-10-CM | POA: Diagnosis not present

## 2017-09-13 DIAGNOSIS — S0990XA Unspecified injury of head, initial encounter: Secondary | ICD-10-CM | POA: Diagnosis not present

## 2017-09-13 DIAGNOSIS — J45909 Unspecified asthma, uncomplicated: Secondary | ICD-10-CM | POA: Diagnosis not present

## 2017-09-13 DIAGNOSIS — R531 Weakness: Secondary | ICD-10-CM | POA: Diagnosis present

## 2017-09-13 LAB — CBC WITH DIFFERENTIAL/PLATELET
BASOS ABS: 0 10*3/uL (ref 0–0.1)
Basophils Relative: 1 %
EOS PCT: 2 %
Eosinophils Absolute: 0.2 10*3/uL (ref 0–0.7)
HEMATOCRIT: 35.4 % (ref 35.0–47.0)
Hemoglobin: 11.8 g/dL — ABNORMAL LOW (ref 12.0–16.0)
LYMPHS ABS: 0.7 10*3/uL — AB (ref 1.0–3.6)
LYMPHS PCT: 10 %
MCH: 30 pg (ref 26.0–34.0)
MCHC: 33.3 g/dL (ref 32.0–36.0)
MCV: 90.3 fL (ref 80.0–100.0)
MONO ABS: 0.3 10*3/uL (ref 0.2–0.9)
MONOS PCT: 5 %
Neutro Abs: 5.9 10*3/uL (ref 1.4–6.5)
Neutrophils Relative %: 82 %
PLATELETS: 143 10*3/uL — AB (ref 150–440)
RBC: 3.92 MIL/uL (ref 3.80–5.20)
RDW: 16.9 % — AB (ref 11.5–14.5)
WBC: 7.2 10*3/uL (ref 3.6–11.0)

## 2017-09-13 LAB — URINALYSIS, COMPLETE (UACMP) WITH MICROSCOPIC
Bilirubin Urine: NEGATIVE
GLUCOSE, UA: NEGATIVE mg/dL
Ketones, ur: NEGATIVE mg/dL
Nitrite: POSITIVE — AB
PROTEIN: NEGATIVE mg/dL
Specific Gravity, Urine: 1.016 (ref 1.005–1.030)
pH: 5 (ref 5.0–8.0)

## 2017-09-13 LAB — BASIC METABOLIC PANEL
Anion gap: 6 (ref 5–15)
BUN: 17 mg/dL (ref 8–23)
CHLORIDE: 99 mmol/L (ref 98–111)
CO2: 32 mmol/L (ref 22–32)
Calcium: 9.1 mg/dL (ref 8.9–10.3)
Creatinine, Ser: 1.06 mg/dL — ABNORMAL HIGH (ref 0.44–1.00)
GFR calc Af Amer: 58 mL/min — ABNORMAL LOW (ref >60)
GFR, EST NON AFRICAN AMERICAN: 50 mL/min — AB (ref >60)
GLUCOSE: 113 mg/dL — AB (ref 70–99)
POTASSIUM: 4.7 mmol/L (ref 3.5–5.1)
Sodium: 137 mmol/L (ref 135–145)

## 2017-09-13 MED ORDER — FOSFOMYCIN TROMETHAMINE 3 G PO PACK
3.0000 g | PACK | Freq: Once | ORAL | Status: AC
  Administered 2017-09-13: 3 g via ORAL
  Filled 2017-09-13: qty 3

## 2017-09-13 NOTE — ED Provider Notes (Signed)
[facility] Emergency Department Provider Note  ____________________________________________  Time seen: Approximately 5:44 PM  I have reviewed the triage vital signs and the nursing notes.   HISTORY  Chief Complaint Fall    HPI [name] [name] is a 77 y.o. female with a history of breast cancer, CHF, COPD on home oxygen who reports generalized weakness since yesterday.  She also has frequent falls.  She denies lightheadedness palpitations chest pain or other acute symptoms that precipitate falls.  She does states that she is walking down the hallway sometimes she loses her balance and falls.  She does not think it is related to her combination of fentanyl and Vicodin and Klonopin that she is been on for many months.  Symptoms are intermittent, worse standing, better sitting.  No particular associated symptoms.  When she fell today she hit her forehead and now has a headache.  She also complains of pain in the neck.  She does report worsening cough since yesterday, nonproductive.  Chronic shortness of breath unchanged.  Her family report that she often gets UTIs that cause symptoms like this to and that she was a little bit confused when she first woke up this morning but that improved throughout the morning.      Past Medical History:  Diagnosis Date  . Asthma   . Breast cancer ([place]) 2009   left  . Cancer ([place])   . CHF (congestive heart failure) ([place])   . CHF (congestive heart failure) ([place])   . Chronic UTI   . COPD (chronic obstructive pulmonary disease) ([place])   . Dizziness   . Fibromyalgia   . Hypertension   . Neuropathy   . Personal history of tobacco use, presenting hazards to health 05/17/2015  . Polyp, larynx   . RA (rheumatoid arthritis) ([place])   . Sinus infection    recent  . Stumbling gait    to the left  . Supplemental oxygen dependent    2.5l     Patient Active Problem List   Diagnosis Date Noted  . Neoplastic malignant related  fatigue 08/20/2017  . Acute respiratory failure ([place]) 07/17/2017  . Encephalopathy acute 06/30/2017  . UTI due to extended-spectrum beta lactamase (ESBL) producing Escherichia coli 05/21/2017  . Acute midline thoracic back pain 05/09/2017  . Compression fracture of T7 vertebra (H[facility]) 05/08/2017  . Rib pain on right side 05/08/2017  . Encounter for antineoplastic immunotherapy 05/08/2017  . B12 deficiency 04/29/2017  . Anemia 04/11/2017  . History of recurrent UTIs 04/11/2017  . Diarrhea 03/28/2017  . Rib lesion 03/03/2017  . Cancer ([place]) 02/16/2017  . Goals of care, counseling/discussion 02/02/2017  . Collapse of left lung 02/02/2017  . Pleural effusion, left 02/02/2017  . HCAP (healthcare-associated pneumonia) 11/04/2016  . Recurrent UTI 09/08/2016  . Lung mass 09/08/2016  . Encounter for antineoplastic chemotherapy 07/24/2016  . Primary cancer of left lower lobe of lung ([place]) 06/25/2016  . Mixed hyperlipidemia 06/19/2016  . Peripheral neuropathy 06/19/2016  . Aneurysm of thoracic aorta ([place]) 04/29/2016  . Pain in limb 04/29/2016  . Acute delirium 03/06/2016  . Acute renal insufficiency 03/06/2016  . Pressure injury of skin 03/05/2016  . UTI (urinary tract infection) 03/04/2016  . Acute renal failure (ARF) ([place]) 12/04/2015  . Protein malnutrition ([place]) 11/24/2015  . Acute respiratory failure with hypoxia ([place])   . Hyponatremia   . COPD (chronic obstructive pulmonary disease) ([place]) 11/15/2015  . Chronic systolic CHF (congestive heart failure) ([place]) 11/15/2015  .  Sepsis ([place]) 11/15/2015  . Descending thoracic aortic aneurysm ([place]) 09/11/2015  . Personal history of tobacco use, presenting hazards to health 05/17/2015  . COPD, moderate ([place]) 05/19/2014  . Abnormal cardiovascular stress test 03/10/2014  . CAD (coronary artery disease) 02/25/2014  . Aneurysm of abdominal vessel ([place]) 02/24/2014  . Shortness of breath 02/24/2014  . Vocal cord polyp 02/13/2014  . Narcotic drug  use 01/17/2013  . Fibromyalgia 05/11/2012  . Low back pain 05/11/2012  . Arthritis 11/03/2011  . Asthma 11/03/2011  . Breast cancer ([place]) 11/03/2011  . Osteoarthritis 06/17/2011     Past Surgical History:  Procedure Laterality Date  . ABDOMINAL HYSTERECTOMY    . BREAST LUMPECTOMY Left 2009   chemo and radiation  . CYST EXCISION Left 02/27/2015   Procedure: CYST REMOVAL;  Surgeon: Hessie Knows, MD;  Location: [facility] ORS;  Service: Orthopedics;  Laterality: Left;  . EYE MUSCLE SURGERY Right    13 surgeries  . FLEXIBLE BRONCHOSCOPY N/A 07/01/2016   Procedure: FLEXIBLE BRONCHOSCOPY;  Surgeon: Wilhelmina Mcardle, MD;  Location: [facility] ORS;  Service: Pulmonary;  Laterality: N/A;  . FLEXIBLE BRONCHOSCOPY N/A 02/09/2017   Procedure: FLEXIBLE BRONCHOSCOPY;  Surgeon: [name], MD;  Location: [facility] ORS;  Service: Pulmonary;  Laterality: N/A;  . KYPHOPLASTY N/A 05/22/2017   Procedure: Celine Ahr;  Surgeon: Hessie Knows, MD;  Location: [facility] ORS;  Service: Orthopedics;  Laterality: N/A;  . PORTA CATH INSERTION N/A 03/04/2017   Procedure: PORTA CATH INSERTION;  Surgeon: [name], MD;  Location: [facility];  Service: Cardiovascular;  Laterality: N/A;  . THUMB ARTHROSCOPY Left      Prior to Admission medications   Medication Sig Start Date End Date Taking? Authorizing Provider  albuterol (PROVENTIL HFA;VENTOLIN HFA) 108 (90 Base) MCG/ACT inhaler Inhale 1-2 puffs into the lungs every 6 (six) hours as needed for wheezing or shortness of breath. Patient taking differently: Inhale 2 puffs into the lungs every 6 (six) hours as needed for wheezing or shortness of breath.  02/16/17   [name], MD  clonazePAM (KLONOPIN) 1 MG tablet Take 1 mg by mouth daily as needed for anxiety.     [provider]  fentaNYL (DURAGESIC - DOSED MCG/HR) 50 MCG/HR Place 1 patch (50 mcg total) onto the skin every 3 (three) days. 08/24/17   Karen Kitchens, NP  gabapentin (NEURONTIN)  600 MG tablet Take 600 mg by mouth 3 (three) times daily.  05/25/12   [provider]  HYDROcodone-acetaminophen (NORCO/VICODIN) 5-325 MG tablet Take 1 tablet by mouth 3 (three) times daily as needed. 08/24/17   Karen Kitchens, NP  LORazepam (ATIVAN) 0.5 MG tablet Take 1 tablet (0.5 mg total) by mouth every 6 (six) hours as needed (Nausea or vomiting). 03/19/17   Lequita Asal, MD  nitroGLYCERIN (NITROSTAT) 0.4 MG SL tablet Place 0.4 mg under the tongue every 5 (five) minutes as needed for chest pain.     [provider]  ondansetron (ZOFRAN) 8 MG tablet Take 1 tablet (8 mg total) by mouth 2 (two) times daily as needed. Start on the third day after chemotherapy. Patient taking differently: Take 8 mg by mouth 2 (two) times daily as needed for nausea or vomiting. Start on the third day after chemotherapy. 03/19/17   Lequita Asal, MD  potassium chloride SA (K-DUR,KLOR-CON) 20 MEQ tablet Take 1 tablet (20 mEq total) by mouth daily for 5 days. 08/24/17 08/29/17  Karen Kitchens, NP  predniSONE [place] Pare Eleanora Neighbor  21 TAB) 10 MG (21) TBPK tablet Taper by 10 mg p.o. daily 07/20/17   Epifanio Lesches, MD  SPIRIVA RESPIMAT 2.5 MCG/ACT AERS Inhale 2.5 mcg into the lungs 2 (two) times daily. 09/10/17   [name], MD  zolpidem (AMBIEN) 5 MG tablet Take 5 mg by mouth at bedtime as needed for sleep.     [provider]     Allergies Contrast media [iodinated diagnostic agents]; Sulfa antibiotics; Tetracycline; White petrolatum; Amoxicillin-pot clavulanate; and Tape   Family History  Problem Relation Age of Onset  . Diabetes Father   . Stroke Father   . Heart attack Father   . CAD Sister     Social History Social History   Tobacco Use  . Smoking status: Current Every Day Smoker    Packs/day: 1.00    Years: 40.00    Pack years: 40.00    Types: Cigarettes  . Smokeless tobacco: Never Used  Substance Use Topics  . Alcohol use: No  . Drug use: No    Review  of Systems  Constitutional:   No fever or chills.  ENT:   No sore throat. No rhinorrhea. Cardiovascular:   No chest pain or syncope. Respiratory:   Chronic shortness of breath.  Positive nonproductive cough. Gastrointestinal:   Negative for abdominal pain, vomiting and diarrhea.  Chronic constipation Musculoskeletal:   Negative for focal pain or swelling All other systems reviewed and are negative except as documented above in ROS and HPI.  ____________________________________________   PHYSICAL EXAM:  VITAL SIGNS: ED Triage Vitals  Enc Vitals Group     BP 09/13/17 1706 (!) 123/47     Pulse Rate 09/13/17 1706 96     Resp 09/13/17 1706 18     Temp 09/13/17 1706 97.7 F (36.5 C)     Temp Source 09/13/17 1706 Oral     SpO2 09/13/17 1714 (!) 70 %     Weight 09/13/17 1707 94 lb (42.6 kg)     Height 09/13/17 1707 _0  (1.626 m)     Head Circumference --      Peak Flow --      Pain Score 09/13/17 1706 8     Pain Loc --      Pain Edu? --      Excl. in [place]? --     Vital signs reviewed, nursing assessments reviewed.   Constitutional:   Alert and oriented. Non-toxic appearance. Eyes:   Conjunctivae are normal. EOMI. PERRL. ENT      Head:   Normocephalic and atraumatic.  Tenderness over the right forehead.      Nose:   No congestion/rhinnorhea.  No epistaxis      Mouth/Throat:   MMM, no pharyngeal erythema. No peritonsillar mass.       Neck:   No meningismus. Full ROM.  Diffuse midline spinal tenderness.  Patient arrives without a cervical collar in place and moving her head all around to demonstrate her range of motion. Hematological/Lymphatic/Immunilogical:   No cervical lymphadenopathy. Cardiovascular:   RRR. Symmetric bilateral radial and DP pulses.  No murmurs. Cap refill less than 2 seconds. Respiratory:   Normal respiratory effort without tachypnea/retractions.  Crackles in the right base.  Slight expiratory wheezing diffusely. Gastrointestinal:   Soft and nontender. Non  distended. There is no CVA tenderness.  No rebound, rigidity, or guarding. Musculoskeletal:   Normal range of motion in all extremities. No joint effusions.  No lower extremity tenderness.  No edema. Neurologic:   Normal  speech and language.  Motor grossly intact. No acute focal neurologic deficits are appreciated.  Skin:    Skin is warm, dry and intact. No rash noted.  No petechiae, purpura, or bullae.  ____________________________________________    LABS (pertinent positives/negatives) (all labs ordered are listed, but only abnormal results are displayed) Labs Reviewed  BASIC METABOLIC PANEL - Abnormal; Notable for the following components:      Result Value   Glucose, Bld 113 (*)    Creatinine, Ser 1.06 (*)    GFR calc non Af Amer 50 (*)    GFR calc Af Amer 58 (*)    All other components within normal limits  CBC WITH DIFFERENTIAL/PLATELET - Abnormal; Notable for the following components:   Hemoglobin 11.8 (*)    RDW 16.9 (*)    Platelets 143 (*)    Lymphs Abs 0.7 (*)    All other components within normal limits  URINALYSIS, COMPLETE (UACMP) WITH MICROSCOPIC - Abnormal; Notable for the following components:   Color, Urine YELLOW (*)    APPearance HAZY (*)    Hgb urine dipstick SMALL (*)    Nitrite POSITIVE (*)    Leukocytes, UA LARGE (*)    Bacteria, UA MANY (*)    Non Squamous Epithelial PRESENT (*)    All other components within normal limits  URINE CULTURE   ____________________________________________   EKG Interpreted by me Sinus rhythm rate of 88, right axis, normal intervals.  Normal QRS ST segments.  Isolated T wave inversion in aVL which is nonspecific.  No acute ischemic changes.   ____________________________________________    RADIOLOGY  Dg Chest 2 View  Result Date: 09/13/2017 CLINICAL DATA:  77 year old female with history of known lung cancer, currently under treatment. Frequent falls. Shortness of breath. EXAM: CHEST - 2 VIEW COMPARISON:  Chest  x-ray 07/27/2017. FINDINGS: Right internal jugular single-lumen porta cath with tip terminating at the superior cavoatrial junction. Chronic atelectasis and scarring in the left lower lobe, similar to prior studies. Chronic moderate to large left pleural effusion is also similar. Right lung is clear. No right pleural effusion. No evidence of pulmonary edema. Mild diffuse interstitial prominence, similar to prior studies. Heart size is upper limits of normal. Ectasia of thoracic aortic arch with extensive aortic atherosclerosis, similar to prior studies. IMPRESSION: 1. Chronic changes similar to prior examinations, as above. No definite radiographic evidence of acute cardiopulmonary disease. 2. Aortic atherosclerosis. Electronically Signed   By: Vinnie Langton M.D.   On: 09/13/2017 18:38   Ct Head Wo Contrast  Result Date: 09/13/2017 CLINICAL DATA:  77 year old female with history of fall with injury to the head. EXAM: CT HEAD WITHOUT CONTRAST CT CERVICAL SPINE WITHOUT CONTRAST TECHNIQUE: Multidetector CT imaging of the head and cervical spine was performed following the standard protocol without intravenous contrast. Multiplanar CT image reconstructions of the cervical spine were also generated. COMPARISON:  Head CT 07/17/2017.  Cervical spine CT 12/11/2016. FINDINGS: CT HEAD FINDINGS Brain: Mild cerebral atrophy. Patchy areas of decreased attenuation are noted throughout the deep and periventricular white matter of the cerebral hemispheres bilaterally, compatible with chronic microvascular ischemic disease. No evidence of acute infarction, hemorrhage, hydrocephalus, extra-axial collection or mass lesion/mass effect. Vascular: No hyperdense vessel or unexpected calcification. Skull: Normal. Negative for fracture or focal lesion. Sinuses/Orbits: No acute finding. Other: None. CT CERVICAL SPINE FINDINGS Alignment: Normal. Skull base and vertebrae: No acute fracture. No primary bone lesion or focal pathologic  process. Soft tissues and spinal canal: No prevertebral fluid  or swelling. No visible canal hematoma. Disc levels: Very mild multilevel degenerative disc disease. Moderate multilevel facet arthropathy (left greater than right). Upper chest: Chronic left pleural effusion incompletely imaged. Right internal jugular central venous catheter. Other: None. IMPRESSION: 1. No evidence of significant acute traumatic injury to the skull, brain or cervical spine. 2. Mild cerebral atrophy with mild chronic microvascular ischemic changes in the cerebral white matter. 3. Very mild multilevel degenerative disc disease and moderate multilevel facet arthropathy. 4. Chronic left pleural effusion. Electronically Signed   By: Vinnie Langton M.D.   On: 09/13/2017 18:28   Ct Cervical Spine Wo Contrast  Result Date: 09/13/2017 CLINICAL DATA:  77 year old female with history of fall with injury to the head. EXAM: CT HEAD WITHOUT CONTRAST CT CERVICAL SPINE WITHOUT CONTRAST TECHNIQUE: Multidetector CT imaging of the head and cervical spine was performed following the standard protocol without intravenous contrast. Multiplanar CT image reconstructions of the cervical spine were also generated. COMPARISON:  Head CT 07/17/2017.  Cervical spine CT 12/11/2016. FINDINGS: CT HEAD FINDINGS Brain: Mild cerebral atrophy. Patchy areas of decreased attenuation are noted throughout the deep and periventricular white matter of the cerebral hemispheres bilaterally, compatible with chronic microvascular ischemic disease. No evidence of acute infarction, hemorrhage, hydrocephalus, extra-axial collection or mass lesion/mass effect. Vascular: No hyperdense vessel or unexpected calcification. Skull: Normal. Negative for fracture or focal lesion. Sinuses/Orbits: No acute finding. Other: None. CT CERVICAL SPINE FINDINGS Alignment: Normal. Skull base and vertebrae: No acute fracture. No primary bone lesion or focal pathologic process. Soft tissues and spinal  canal: No prevertebral fluid or swelling. No visible canal hematoma. Disc levels: Very mild multilevel degenerative disc disease. Moderate multilevel facet arthropathy (left greater than right). Upper chest: Chronic left pleural effusion incompletely imaged. Right internal jugular central venous catheter. Other: None. IMPRESSION: 1. No evidence of significant acute traumatic injury to the skull, brain or cervical spine. 2. Mild cerebral atrophy with mild chronic microvascular ischemic changes in the cerebral white matter. 3. Very mild multilevel degenerative disc disease and moderate multilevel facet arthropathy. 4. Chronic left pleural effusion. Electronically Signed   By: Vinnie Langton M.D.   On: 09/13/2017 18:28    ____________________________________________   PROCEDURES Procedures  ____________________________________________  DIFFERENTIAL DIAGNOSIS   UTI, pneumonia, dehydration, electrolyte disturbance, medication side effect, subdural hematoma, cervical spine fracture  CLINICAL IMPRESSION / ASSESSMENT AND PLAN / ED COURSE  Pertinent labs & imaging results that were available during my care of the patient were reviewed by me and considered in my medical decision making (see chart for details).    Patient's nontoxic and not in distress, presents with generalized weakness, increased coughing, recurrent falls including today.  Trauma work-up with CT head and cervical spine.  The generalized weakness and cough will be worked up with labs, chest x-ray, urinalysis.  Her falls are possibly medication related.  Doubt hypercapnia.  Patient is not septic.  Doubt stroke or PE  Clinical Course as of Sep 13 2048  Sun Sep 13, 2017  1748 Triage hypoxia apparently erroneous.  96 to 99% on her usual nasal cannula oxygen on arrival to the treatment room.  SpO2(!): 70 % [PS]  1840 CT head and cervical spine unremarkable, no traumatic injuries.   [PS]  7017 Chest x-ray unremarkable.   [PS]  1948  UA shows clear UTI. Due to allergy profile, and prior urine cultures showing ESBL resistance pattern, I will give her a 3 g dose of fosfomycin.  Urine culture is pending.  Plan for close outpatient follow-up this week for continued monitoring of her symptoms.  If she has worsening of her condition or fever she will need to return to the hospital for IV antibiotic's.  Nitrite(!): POSITIVE [PS]    Clinical Course User Index [PS] Carrie Mew, MD     ----------------------------------------- 8:50 PM on 09/13/2017 -----------------------------------------  Results discussed with the patient who agrees with outpatient follow-up.  She reports she has an appointment with her doctor in 2 days.  Return precautions discussed including returning to the hospital if her condition worsens or has a fever.  ____________________________________________   FINAL CLINICAL IMPRESSION(S) / ED DIAGNOSES    Final diagnoses:  Cystitis  Fall in home, initial encounter     ED Discharge Orders    None      Portions of this note were generated with dragon dictation software. Dictation errors may occur despite best attempts at proofreading.    Carrie Mew, MD 09/13/17 2050

## 2017-09-13 NOTE — ED Triage Notes (Signed)
Pt arrived after mechanical fall that resulted in patient hitting her head. Pt denies any loc. Pt reports multiple falls recently due to an unsteady gait.

## 2017-09-13 NOTE — ED Notes (Signed)
Pt has been feeling weak  Recently  And has been falling lately she fell today approx 3 hours ago she injured her r shoulder  And head    Her neck is sore she is awake and alert

## 2017-09-13 NOTE — Discharge Instructions (Signed)
Follow-up with your doctor in 2 days.  Return to the ED if your symptoms worsen or you have fever.  Urine culture is pending in the lab.

## 2017-09-13 NOTE — ED Notes (Signed)

## 2017-09-13 NOTE — ED Notes (Signed)
ED Provider at bedside. 

## 2017-09-13 NOTE — ED Notes (Addendum)
Pt appears in NAD, pulse ox in 3 locations 70% with positive pleth. Pt states she in no more short of breath than normal

## 2017-09-14 NOTE — Progress Notes (Signed)
* [facility]      Assessment and Plan:  Left lower lobe atelectasis secondary to lung cancer, small left base pleural effusion. - Discussed that drainage of the left pleural effusion would likely give minimal and temporary relief.  The effusion appears to be small to moderate, therefore a Pleurx catheter would be of limited symptomatic benefit. --Her most recent oncology visit the patient was referred for Hospice, but declined.  On discussion today she is amenable to home with hospice, will arrange.  COPD, with chronic respiratory failure, dyspnea on exertion. - [name] [name] status appears worse today, with continued decline, currently on 5 L nasal cannula.  Continue Spiriva, added rescue inhaler.  --Will start home nebulizer.  Nicotine Abuse.  --Continue daily smoker, this is also likely contributing to her dyspnea.  --Discussed the importance of smoking cessation, spent 3 min in discussion.   Advance care planning. - Spent 20 minutes in discussion, on continuing treatments, and concept of quality of life versus quantity.  Discussed that I expect her life expectancy is likely 6 months or less, and she would benefit from hospice services.  She confirms that she would like to be DNR, would not want lifesaving therapy such as CPR or intubation.  She has had difficulty discussing these as her family is having difficulty accepting her current decline, and does not want to discuss it.  Meds ordered this encounter  Medications  . AMBULATORY NON FORMULARY MEDICATION    Sig: Medication Name: nebulizer and supplies DX: J96.11/J44.9 [id number]    Dispense:  1 each    Refill:  0   Return in about 3 months (around 12/15/2017).  Date: 09/14/2017  MRN# [id number] [name] [name] 10/07/1940   [name] [name] is a 77 y.o. old female seen in follow up for chief complaint of  Chief Complaint  Patient presents with  . COPD    pt states she is having a hard time breathing. She is on 4  Liters 02 at rest and at least 8 with exertion  . Cough    brown mucus  . Wheezing  . Chest Pain     [name] [name] is a 77 y.o. female with a history of breast cancer, CHF, COPD on home oxygen Patient was diagnosed with squamous cell lung cancer on 07/01/16 by bronchoscopy.  She [id number] sqamous cell lung cancer  status post chemotherapy radiation with persistence seen on bronch on 02/09/17 She was last seen in oncology on 08/24/17 by oncology who noted that her status was declining, and she had been having multiple falls. Hospice was recommended but patient declined. The patient was in the ED on 8/31 for fall. She is here with a friend she tells me that her breathing has been getting short.  She is using spiriva at home once daily.   **CXR 09/15/17>>Images personally reviewed, continued left lower lobe atelectasis, small to moderate left base pleural effusion, slightly larger than previous. **Bronch 07/01/16 positive for SqCa.  **Re-Bronch 02/09/17, positive for SqCa. **PET scan 01/27/17 on comparison with CT chest 11/21/16 shows minimally changed LLL atelectasis with small pleural effusion in the left.   Desat walk 12/01/16; Baseline sat on RA at rest; sat 94% and HR 92;  After walking 360 feet she felt mild dyspnea, sat was 92% and HR 97.   CT chest 11/21/16, and comparison with previous on 06/11/16, there is complete left lower lobe atelectasis secondary to obstruction of the left lower lobe bronchus.  On previous CT  chest and on previous bronchoscopy this was partially obstructed, this now appears to be completely obstructed.    Medication:    Current Outpatient Medications:  .  albuterol (PROVENTIL HFA;VENTOLIN HFA) 108 (90 Base) MCG/ACT inhaler, Inhale 1-2 puffs into the lungs every 6 (six) hours as needed for wheezing or shortness of breath. (Patient taking differently: Inhale 2 puffs into the lungs every 6 (six) hours as needed for wheezing or shortness of breath. ), Disp: 1 Inhaler, Rfl:  2 .  clonazePAM (KLONOPIN) 1 MG tablet, Take 1 mg by mouth daily as needed for anxiety. , Disp: , Rfl:  .  fentaNYL (DURAGESIC - DOSED MCG/HR) 50 MCG/HR, Place 1 patch (50 mcg total) onto the skin every 3 (three) days., Disp: 5 patch, Rfl: 0 .  gabapentin (NEURONTIN) 600 MG tablet, Take 600 mg by mouth 3 (three) times daily. , Disp: , Rfl:  .  HYDROcodone-acetaminophen (NORCO/VICODIN) 5-325 MG tablet, Take 1 tablet by mouth 3 (three) times daily as needed., Disp: 30 tablet, Rfl: 0 .  LORazepam (ATIVAN) 0.5 MG tablet, Take 1 tablet (0.5 mg total) by mouth every 6 (six) hours as needed (Nausea or vomiting)., Disp: 30 tablet, Rfl: 0 .  nitroGLYCERIN (NITROSTAT) 0.4 MG SL tablet, Place 0.4 mg under the tongue every 5 (five) minutes as needed for chest pain. , Disp: , Rfl:  .  ondansetron (ZOFRAN) 8 MG tablet, Take 1 tablet (8 mg total) by mouth 2 (two) times daily as needed. Start on the third day after chemotherapy. (Patient taking differently: Take 8 mg by mouth 2 (two) times daily as needed for nausea or vomiting. Start on the third day after chemotherapy.), Disp: 30 tablet, Rfl: 1 .  potassium chloride SA (K-DUR,KLOR-CON) 20 MEQ tablet, Take 1 tablet (20 mEq total) by mouth daily for 5 days., Disp: 5 tablet, Rfl: 0 .  predniSONE (STERAPRED UNI-PAK 21 TAB) 10 MG (21) TBPK tablet, Taper by 10 mg p.o. daily, Disp: 21 tablet, Rfl: 0 .  SPIRIVA RESPIMAT 2.5 MCG/ACT AERS, Inhale 2.5 mcg into the lungs 2 (two) times daily., Disp: 4 g, Rfl: 0 .  zolpidem (AMBIEN) 5 MG tablet, Take 5 mg by mouth at bedtime as needed for sleep. , Disp: , Rfl:  No current facility-administered medications for this visit.   Facility-Administered Medications Ordered in Other Visits:  .  cyanocobalamin ((VITAMIN B-12)) injection 1,000 mcg, 1,000 mcg, Intramuscular, Once, Honor Loh E, NP .  heparin lock flush 100 unit/mL, 500 Units, Intravenous, Once, [name], Melissa C, MD  Allergies:  Contrast media [iodinated diagnostic  agents]; Sulfa antibiotics; Tetracycline; White petrolatum; Amoxicillin-pot clavulanate; and Tape  Review of Systems:  Constitutional: Feels well. Cardiovascular: No chest pain.  Pulmonary: Denies hemoptysis The remainder of systems were reviewed and were found to be negative other than what is documented in the HPI.    Physical Examination:   VS: BP 108/76 (BP Location: Right Arm, Cuff Size: Normal)   Pulse 82   Resp 16   Ht 5\' 4"  (1.626 m)   Wt 102 lb (46.3 kg)   BMI 17.51 kg/m   General Appearance: No distress  Neuro:without focal findings, mental status, speech normal, alert and oriented HEENT: PERRLA, EOM intact Pulmonary: Decreased air entry bilaterally. CardiovascularNormal S1,S2.  No m/r/g.  Abdomen: Benign, Soft, non-tender, No masses Renal:  No costovertebral tenderness  GU:  No performed at this time. Endoc: No evident thyromegaly, no signs of acromegaly or Cushing features Skin:   warm, no rashes, no  ecchymosis  Extremities: normal, no cyanosis, clubbing.     LABORATORY PANEL:   CBC Recent Labs  Lab 09/13/17 1851  WBC 7.2  HGB 11.8*  HCT 35.4  PLT 143*   ------------------------------------------------------------------------------------------------------------------  Chemistries  Recent Labs  Lab 09/13/17 1851  NA 137  K 4.7  CL 99  CO2 32  GLUCOSE 113*  BUN 17  CREATININE 1.06*  CALCIUM 9.1   ------------------------------------------------------------------------------------------------------------------  Cardiac Enzymes No results for input(s): TROPONINI in the last 168 hours. ------------------------------------------------------------  RADIOLOGY:   No results found for this or any previous visit. Results for orders placed during the hospital encounter of 10/28/16  DG Chest 2 View   Narrative CLINICAL DATA:  Off and chest congestion for the past 2 weeks with fever. History of COPD -asthma, current smoker, CHF.  EXAM: CHEST  2  VIEW  COMPARISON:  Chest x-ray of August 07, 2016  FINDINGS: The right lung is mildly hyperinflated. There is a tiny right pleural effusion. On the left there is a small pleural effusion. There is left basilar atelectasis or infiltrate. The heart and pulmonary vascularity are normal. There is calcification in the wall of the aortic arch. There is mild multilevel degenerative disc disease of the thoracic spine.  IMPRESSION: Left lower lobe atelectasis or pneumonia with small left pleural effusion. Tiny right pleural effusion. Underlying COPD. No overt CHF. Followup PA and lateral chest X-ray is recommended in 3-4 weeks following trial of antibiotic therapy to ensure resolution and exclude underlying malignancy.  Thoracic aortic atherosclerosis.   Electronically Signed   By: David  Martinique M.D.   On: 10/28/2016 12:18    ------------------------------------------------------------------------------------------------------------------  Thank  you for allowing [facility] to assist in the care of your patient. Our recommendations are noted above.  Please contact us if we can be of further service.   [name], M.D., F.C.C.P.  Board Certified in Internal Medicine, Pulmonary Medicine, [place], and Sleep Medicine.  [facility] Office Number: [phone]  09/14/2017

## 2017-09-15 ENCOUNTER — Ambulatory Visit (INDEPENDENT_AMBULATORY_CARE_PROVIDER_SITE_OTHER): Payer: Medicare HMO | Admitting: Internal Medicine

## 2017-09-15 ENCOUNTER — Encounter: Payer: Self-pay | Admitting: Internal Medicine

## 2017-09-15 VITALS — BP 108/76 | HR 82 | Resp 16 | Ht 64.0 in | Wt 102.0 lb

## 2017-09-15 DIAGNOSIS — F17210 Nicotine dependence, cigarettes, uncomplicated: Secondary | ICD-10-CM | POA: Diagnosis not present

## 2017-09-15 DIAGNOSIS — J449 Chronic obstructive pulmonary disease, unspecified: Secondary | ICD-10-CM

## 2017-09-15 DIAGNOSIS — C3432 Malignant neoplasm of lower lobe, left bronchus or lung: Secondary | ICD-10-CM | POA: Diagnosis not present

## 2017-09-15 DIAGNOSIS — J9611 Chronic respiratory failure with hypoxia: Secondary | ICD-10-CM

## 2017-09-15 DIAGNOSIS — Z720 Tobacco use: Secondary | ICD-10-CM

## 2017-09-15 MED ORDER — AMBULATORY NON FORMULARY MEDICATION: 0 refills | Status: AC

## 2017-09-15 NOTE — Patient Instructions (Signed)
Will refer you to home hospice.  Will start a nebulizer with medication to be used three times daily.

## 2017-09-16 ENCOUNTER — Other Ambulatory Visit: Payer: Self-pay

## 2017-09-16 ENCOUNTER — Emergency Department: Payer: Medicare HMO

## 2017-09-16 ENCOUNTER — Telehealth: Payer: Self-pay | Admitting: *Deleted

## 2017-09-16 ENCOUNTER — Encounter: Payer: Self-pay | Admitting: Emergency Medicine

## 2017-09-16 ENCOUNTER — Inpatient Hospital Stay
Admission: EM | Admit: 2017-09-16 | Discharge: 2017-09-18 | DRG: 189 | Disposition: A | Discharge status: Home / Self Care | Payer: Medicare HMO | Attending: Internal Medicine | Admitting: Internal Medicine

## 2017-09-16 DIAGNOSIS — Z9981 Dependence on supplemental oxygen: Secondary | ICD-10-CM

## 2017-09-16 DIAGNOSIS — B9629 Other Escherichia coli [E. coli] as the cause of diseases classified elsewhere: Secondary | ICD-10-CM | POA: Diagnosis not present

## 2017-09-16 DIAGNOSIS — N3000 Acute cystitis without hematuria: Secondary | ICD-10-CM | POA: Diagnosis present

## 2017-09-16 DIAGNOSIS — R410 Disorientation, unspecified: Secondary | ICD-10-CM

## 2017-09-16 DIAGNOSIS — G8929 Other chronic pain: Secondary | ICD-10-CM | POA: Diagnosis present

## 2017-09-16 DIAGNOSIS — B9620 Unspecified Escherichia coli [E. coli] as the cause of diseases classified elsewhere: Secondary | ICD-10-CM | POA: Diagnosis present

## 2017-09-16 DIAGNOSIS — J441 Chronic obstructive pulmonary disease with (acute) exacerbation: Secondary | ICD-10-CM | POA: Diagnosis present

## 2017-09-16 DIAGNOSIS — I110 Hypertensive heart disease with heart failure: Secondary | ICD-10-CM | POA: Diagnosis present

## 2017-09-16 DIAGNOSIS — R4182 Altered mental status, unspecified: Secondary | ICD-10-CM | POA: Diagnosis not present

## 2017-09-16 DIAGNOSIS — E43 Unspecified severe protein-calorie malnutrition: Secondary | ICD-10-CM | POA: Diagnosis present

## 2017-09-16 DIAGNOSIS — E782 Mixed hyperlipidemia: Secondary | ICD-10-CM | POA: Diagnosis present

## 2017-09-16 DIAGNOSIS — Z1612 Extended spectrum beta lactamase (ESBL) resistance: Secondary | ICD-10-CM | POA: Diagnosis present

## 2017-09-16 DIAGNOSIS — M797 Fibromyalgia: Secondary | ICD-10-CM | POA: Diagnosis present

## 2017-09-16 DIAGNOSIS — Z66 Do not resuscitate: Secondary | ICD-10-CM | POA: Diagnosis present

## 2017-09-16 DIAGNOSIS — S199XXA Unspecified injury of neck, initial encounter: Secondary | ICD-10-CM | POA: Diagnosis not present

## 2017-09-16 DIAGNOSIS — I5022 Chronic systolic (congestive) heart failure: Secondary | ICD-10-CM | POA: Diagnosis not present

## 2017-09-16 DIAGNOSIS — F17210 Nicotine dependence, cigarettes, uncomplicated: Secondary | ICD-10-CM | POA: Diagnosis not present

## 2017-09-16 DIAGNOSIS — R0689 Other abnormalities of breathing: Secondary | ICD-10-CM | POA: Diagnosis not present

## 2017-09-16 DIAGNOSIS — E871 Hypo-osmolality and hyponatremia: Secondary | ICD-10-CM | POA: Diagnosis present

## 2017-09-16 DIAGNOSIS — J9621 Acute and chronic respiratory failure with hypoxia: Secondary | ICD-10-CM | POA: Diagnosis present

## 2017-09-16 DIAGNOSIS — G629 Polyneuropathy, unspecified: Secondary | ICD-10-CM | POA: Diagnosis present

## 2017-09-16 DIAGNOSIS — Z79899 Other long term (current) drug therapy: Secondary | ICD-10-CM

## 2017-09-16 DIAGNOSIS — Z9181 History of falling: Secondary | ICD-10-CM | POA: Diagnosis not present

## 2017-09-16 DIAGNOSIS — Z9221 Personal history of antineoplastic chemotherapy: Secondary | ICD-10-CM

## 2017-09-16 DIAGNOSIS — D630 Anemia in neoplastic disease: Secondary | ICD-10-CM | POA: Diagnosis present

## 2017-09-16 DIAGNOSIS — Z881 Allergy status to other antibiotic agents status: Secondary | ICD-10-CM

## 2017-09-16 DIAGNOSIS — J449 Chronic obstructive pulmonary disease, unspecified: Secondary | ICD-10-CM | POA: Diagnosis not present

## 2017-09-16 DIAGNOSIS — C3432 Malignant neoplasm of lower lobe, left bronchus or lung: Secondary | ICD-10-CM | POA: Diagnosis present

## 2017-09-16 DIAGNOSIS — E872 Acidosis: Secondary | ICD-10-CM | POA: Diagnosis present

## 2017-09-16 DIAGNOSIS — N390 Urinary tract infection, site not specified: Secondary | ICD-10-CM | POA: Diagnosis present

## 2017-09-16 DIAGNOSIS — A499 Bacterial infection, unspecified: Secondary | ICD-10-CM

## 2017-09-16 DIAGNOSIS — J90 Pleural effusion, not elsewhere classified: Secondary | ICD-10-CM | POA: Diagnosis present

## 2017-09-16 DIAGNOSIS — D6959 Other secondary thrombocytopenia: Secondary | ICD-10-CM | POA: Diagnosis present

## 2017-09-16 DIAGNOSIS — Z882 Allergy status to sulfonamides status: Secondary | ICD-10-CM

## 2017-09-16 DIAGNOSIS — R296 Repeated falls: Secondary | ICD-10-CM | POA: Diagnosis present

## 2017-09-16 DIAGNOSIS — I2510 Atherosclerotic heart disease of native coronary artery without angina pectoris: Secondary | ICD-10-CM | POA: Diagnosis present

## 2017-09-16 DIAGNOSIS — Z681 Body mass index (BMI) 19 or less, adult: Secondary | ICD-10-CM

## 2017-09-16 DIAGNOSIS — I5042 Chronic combined systolic (congestive) and diastolic (congestive) heart failure: Secondary | ICD-10-CM | POA: Diagnosis present

## 2017-09-16 DIAGNOSIS — R7989 Other specified abnormal findings of blood chemistry: Secondary | ICD-10-CM | POA: Diagnosis not present

## 2017-09-16 DIAGNOSIS — S0990XA Unspecified injury of head, initial encounter: Secondary | ICD-10-CM | POA: Diagnosis not present

## 2017-09-16 DIAGNOSIS — J9622 Acute and chronic respiratory failure with hypercapnia: Principal | ICD-10-CM | POA: Diagnosis present

## 2017-09-16 DIAGNOSIS — Z91041 Radiographic dye allergy status: Secondary | ICD-10-CM

## 2017-09-16 DIAGNOSIS — G9341 Metabolic encephalopathy: Secondary | ICD-10-CM | POA: Diagnosis present

## 2017-09-16 DIAGNOSIS — J9602 Acute respiratory failure with hypercapnia: Secondary | ICD-10-CM | POA: Diagnosis not present

## 2017-09-16 DIAGNOSIS — Z91048 Other nonmedicinal substance allergy status: Secondary | ICD-10-CM

## 2017-09-16 DIAGNOSIS — S299XXA Unspecified injury of thorax, initial encounter: Secondary | ICD-10-CM | POA: Diagnosis not present

## 2017-09-16 DIAGNOSIS — Z853 Personal history of malignant neoplasm of breast: Secondary | ICD-10-CM

## 2017-09-16 DIAGNOSIS — Z923 Personal history of irradiation: Secondary | ICD-10-CM

## 2017-09-16 DIAGNOSIS — L089 Local infection of the skin and subcutaneous tissue, unspecified: Secondary | ICD-10-CM | POA: Diagnosis not present

## 2017-09-16 DIAGNOSIS — M069 Rheumatoid arthritis, unspecified: Secondary | ICD-10-CM | POA: Diagnosis present

## 2017-09-16 DIAGNOSIS — Z87440 Personal history of urinary (tract) infections: Secondary | ICD-10-CM

## 2017-09-16 DIAGNOSIS — C3490 Malignant neoplasm of unspecified part of unspecified bronchus or lung: Secondary | ICD-10-CM | POA: Diagnosis not present

## 2017-09-16 LAB — CBC WITH DIFFERENTIAL/PLATELET
BASOS ABS: 0 10*3/uL (ref 0–0.1)
Basophils Relative: 1 %
EOS ABS: 0.3 10*3/uL (ref 0–0.7)
Eosinophils Relative: 6 %
HCT: 32.1 % — ABNORMAL LOW (ref 35.0–47.0)
Hemoglobin: 10.8 g/dL — ABNORMAL LOW (ref 12.0–16.0)
LYMPHS ABS: 1 10*3/uL (ref 1.0–3.6)
Lymphocytes Relative: 22 %
MCH: 30.2 pg (ref 26.0–34.0)
MCHC: 33.5 g/dL (ref 32.0–36.0)
MCV: 90 fL (ref 80.0–100.0)
MONOS PCT: 11 %
Monocytes Absolute: 0.5 10*3/uL (ref 0.2–0.9)
NEUTROS PCT: 60 %
Neutro Abs: 2.8 10*3/uL (ref 1.4–6.5)
Platelets: 156 10*3/uL (ref 150–440)
RBC: 3.57 MIL/uL — AB (ref 3.80–5.20)
RDW: 17 % — ABNORMAL HIGH (ref 11.5–14.5)
WBC: 4.7 10*3/uL (ref 3.6–11.0)

## 2017-09-16 LAB — URINE CULTURE

## 2017-09-16 LAB — COMPREHENSIVE METABOLIC PANEL
ALBUMIN: 3.1 g/dL — AB (ref 3.5–5.0)
ALT: 10 U/L (ref 0–44)
AST: 17 U/L (ref 15–41)
Alkaline Phosphatase: 74 U/L (ref 38–126)
Anion gap: 6 (ref 5–15)
BUN: 10 mg/dL (ref 8–23)
CHLORIDE: 97 mmol/L — AB (ref 98–111)
CO2: 33 mmol/L — AB (ref 22–32)
CREATININE: 0.89 mg/dL (ref 0.44–1.00)
Calcium: 8.7 mg/dL — ABNORMAL LOW (ref 8.9–10.3)
GFR calc Af Amer: >60 mL/min (ref >60)
GLUCOSE: 118 mg/dL — AB (ref 70–99)
Potassium: 4.2 mmol/L (ref 3.5–5.1)
SODIUM: 136 mmol/L (ref 135–145)
Total Bilirubin: 0.5 mg/dL (ref 0.3–1.2)
Total Protein: 6.2 g/dL — ABNORMAL LOW (ref 6.5–8.1)

## 2017-09-16 LAB — URINALYSIS, COMPLETE (UACMP) WITH MICROSCOPIC
BILIRUBIN URINE: NEGATIVE
Glucose, UA: NEGATIVE mg/dL
Hgb urine dipstick: NEGATIVE
KETONES UR: NEGATIVE mg/dL
Nitrite: NEGATIVE
PROTEIN: NEGATIVE mg/dL
Specific Gravity, Urine: 1.009 (ref 1.005–1.030)
pH: 5 (ref 5.0–8.0)

## 2017-09-16 LAB — LACTIC ACID, PLASMA: Lactic Acid, Venous: 0.5 mmol/L (ref 0.5–1.9)

## 2017-09-16 MED ORDER — IPRATROPIUM-ALBUTEROL 0.5-2.5 (3) MG/3ML IN SOLN
3.0000 mL | Freq: Once | RESPIRATORY_TRACT | Status: AC
  Administered 2017-09-16: 3 mL via RESPIRATORY_TRACT
  Filled 2017-09-16: qty 3

## 2017-09-16 MED ORDER — SODIUM CHLORIDE 0.9 % IV BOLUS
500.0000 mL | Freq: Once | INTRAVENOUS | Status: AC
  Administered 2017-09-16: 500 mL via INTRAVENOUS

## 2017-09-16 MED ORDER — SODIUM CHLORIDE 0.9 % IV SOLN
1.0000 g | Freq: Once | INTRAVENOUS | Status: DC
  Filled 2017-09-16: qty 1

## 2017-09-16 MED ORDER — LIDOCAINE HCL (PF) 1 % IJ SOLN
INTRAMUSCULAR | Status: AC
  Filled 2017-09-16: qty 5

## 2017-09-16 MED ORDER — LIDOCAINE-PRILOCAINE 2.5-2.5 % EX CREA
TOPICAL_CREAM | Freq: Once | CUTANEOUS | Status: AC
  Administered 2017-09-16: 17:00:00 via TOPICAL

## 2017-09-16 MED ORDER — LIDOCAINE-PRILOCAINE 2.5-2.5 % EX CREA
TOPICAL_CREAM | CUTANEOUS | Status: AC
  Filled 2017-09-16: qty 5

## 2017-09-16 MED ORDER — SODIUM CHLORIDE 0.9 % IV SOLN
1.0000 g | Freq: Two times a day (BID) | INTRAVENOUS | Status: DC
  Administered 2017-09-17 – 2017-09-18 (×3): 1 g via INTRAVENOUS
  Filled 2017-09-16 (×7): qty 1

## 2017-09-16 MED ORDER — SODIUM CHLORIDE 0.9 % IV SOLN
1.0000 g | Freq: Three times a day (TID) | INTRAVENOUS | Status: DC
  Administered 2017-09-16: 1 g via INTRAVENOUS

## 2017-09-16 NOTE — Telephone Encounter (Signed)
Hospice called to report that Dr Ashby Dawes referred patient to Hospice, but he will not serve as attending. They are asking if Dr Humberto Seals will serve and sign orders. Please advise

## 2017-09-16 NOTE — H&P (Signed)
[facility] NAME: [name] [name]    MR#:  [id number]  DATE OF BIRTH:  1940/02/28  DATE OF ADMISSION:  09/16/2017  PRIMARY CARE PHYSICIAN: Tracie Harrier, MD   REQUESTING/REFERRING PHYSICIAN: Quentin Cornwall, MD  CHIEF COMPLAINT:   Chief Complaint  Patient presents with  . Fall  . Abnormal Lab    HISTORY OF PRESENT ILLNESS:  [name] [name]  is a 77 y.o. female who presents with chief complaint as above.  Patient was recently diagnosed with UTI and treated with fosfomycin.  She comes back today due to weakness and falls.  She is found here in the ED to be hyponatremic, with persistent UTI.  Patient does have a history of lung cancer as well, and oncology was contacted by ED physician and recommend admitting the patient and getting MRI of the brain to make sure there are no metastases.  Hospitalist were called for admission  PAST MEDICAL HISTORY:   Past Medical History:  Diagnosis Date  . Asthma   . Breast cancer ([place]) 2009   left  . Cancer ([place])   . CHF (congestive heart failure) ([place])   . CHF (congestive heart failure) ([place])   . Chronic UTI   . COPD (chronic obstructive pulmonary disease) ([place])   . Dizziness   . Fibromyalgia   . Hypertension   . Neuropathy   . Personal history of tobacco use, presenting hazards to health 05/17/2015  . Polyp, larynx   . RA (rheumatoid arthritis) ([place])   . Sinus infection    recent  . Stumbling gait    to the left  . Supplemental oxygen dependent    2.5l     PAST SURGICAL HISTORY:   Past Surgical History:  Procedure Laterality Date  . ABDOMINAL HYSTERECTOMY    . BREAST LUMPECTOMY Left 2009   chemo and radiation  . CYST EXCISION Left 02/27/2015   Procedure: CYST REMOVAL;  Surgeon: Hessie Knows, MD;  Location: [facility];  Service: Orthopedics;  Laterality: Left;  . EYE MUSCLE SURGERY Right    13 surgeries  . FLEXIBLE BRONCHOSCOPY N/A 07/01/2016   Procedure: FLEXIBLE BRONCHOSCOPY;   Surgeon: Wilhelmina Mcardle, MD;  Location: [facility];  Service: Pulmonary;  Laterality: N/A;  . FLEXIBLE BRONCHOSCOPY N/A 02/09/2017   Procedure: FLEXIBLE BRONCHOSCOPY;  Surgeon: [name], MD;  Location: [facility];  Service: Pulmonary;  Laterality: N/A;  . KYPHOPLASTY N/A 05/22/2017   Procedure: Celine Ahr;  Surgeon: Hessie Knows, MD;  Location: [facility];  Service: Orthopedics;  Laterality: N/A;  . PORTA CATH INSERTION N/A 03/04/2017   Procedure: PORTA CATH INSERTION;  Surgeon: [name], MD;  Location: [facility];  Service: Cardiovascular;  Laterality: N/A;  . THUMB ARTHROSCOPY Left      SOCIAL HISTORY:   Social History   Tobacco Use  . Smoking status: Current Every Day Smoker    Packs/day: 1.00    Years: 40.00    Pack years: 40.00    Types: Cigarettes  . Smokeless tobacco: Never Used  Substance Use Topics  . Alcohol use: No     FAMILY HISTORY:   Family History  Problem Relation Age of Onset  . Diabetes Father   . Stroke Father   . Heart attack Father   . CAD Sister      DRUG ALLERGIES:   Allergies  Allergen Reactions  . Contrast Media [Iodinated Diagnostic Agents] Shortness Of Breath    Acute onset of  shortness of breath following CT contrast administration 06/18/17. Sent to the ED. Dr. Kerman Passey documented incident as an allergic reaction. Needs premedications prior to future contrast media injections. Previous questionable contrast allergy, so the patient was given entire or partial premedications without complications. She was not given any premedications on 06/18/17, resulting in shortness of breath and an ED visit.  . Sulfa Antibiotics Other (See Comments)    Unknown  . Tetracycline Hives  . White Petrolatum Other (See Comments)    Blisters  . Amoxicillin-Pot Clavulanate Rash and Other (See Comments)    Blisters in mouth Has patient had a PCN reaction causing immediate rash, facial/tongue/throat swelling, SOB or lightheadedness with  hypotension: No Has patient had a PCN reaction causing severe rash involving mucus membranes or skin necrosis: No Has patient had a PCN reaction that required hospitalization: No Has patient had a PCN reaction occurring within the last 10 years: No If all of the above answers are "NO", then may proceed with Cephalosporin use.   . Tape Rash    MEDICATIONS AT HOME:   Prior to Admission medications   Medication Sig Start Date End Date Taking? Authorizing Provider  albuterol (PROVENTIL HFA;VENTOLIN HFA) 108 (90 Base) MCG/ACT inhaler Inhale 1-2 puffs into the lungs every 6 (six) hours as needed for wheezing or shortness of breath. Patient taking differently: Inhale 2 puffs into the lungs every 6 (six) hours as needed for wheezing or shortness of breath.  02/16/17   [name], MD  AMBULATORY NON FORMULARY MEDICATION Medication Name: nebulizer and supplies DX: J96.11/J44.9 [id number]:[id number] 09/15/17   [name], MD  clonazePAM (KLONOPIN) 1 MG tablet Take 1 mg by mouth daily as needed for anxiety.     [provider]  fentaNYL (DURAGESIC - DOSED MCG/HR) 50 MCG/HR Place 1 patch (50 mcg total) onto the skin every 3 (three) days. 08/24/17   Karen Kitchens, NP  gabapentin (NEURONTIN) 600 MG tablet Take 600 mg by mouth 3 (three) times daily.  05/25/12   [provider]  HYDROcodone-acetaminophen (NORCO/VICODIN) 5-325 MG tablet Take 1 tablet by mouth 3 (three) times daily as needed. 08/24/17   Karen Kitchens, NP  LORazepam (ATIVAN) 0.5 MG tablet Take 1 tablet (0.5 mg total) by mouth every 6 (six) hours as needed (Nausea or vomiting). 03/19/17   Lequita Asal, MD  nitroGLYCERIN (NITROSTAT) 0.4 MG SL tablet Place 0.4 mg under the tongue every 5 (five) minutes as needed for chest pain.     [provider]  ondansetron (ZOFRAN) 8 MG tablet Take 1 tablet (8 mg total) by mouth 2 (two) times daily as needed. Start on the third day after chemotherapy. Patient taking  differently: Take 8 mg by mouth 2 (two) times daily as needed for nausea or vomiting. Start on the third day after chemotherapy. 03/19/17   Lequita Asal, MD  potassium chloride SA (K-DUR,KLOR-CON) 20 MEQ tablet Take 1 tablet (20 mEq total) by mouth daily for 5 days. 08/24/17 08/29/17  Karen Kitchens, NP  predniSONE (STERAPRED UNI-PAK 21 TAB) 10 MG (21) TBPK tablet Taper by 10 mg p.o. daily 07/20/17   Epifanio Lesches, MD  SPIRIVA RESPIMAT 2.5 MCG/ACT AERS Inhale 2.5 mcg into the lungs 2 (two) times daily. 09/10/17   [name], MD  zolpidem (AMBIEN) 5 MG tablet Take 5 mg by mouth at bedtime as needed for sleep.     [provider]    REVIEW OF SYSTEMS:  Review of Systems  Constitutional: Negative  for chills, fever, malaise/fatigue and weight loss.  HENT: Negative for ear pain, hearing loss and tinnitus.   Eyes: Negative for blurred vision, double vision, pain and redness.  Respiratory: Negative for cough, hemoptysis and shortness of breath.   Cardiovascular: Negative for chest pain, palpitations, orthopnea and leg swelling.  Gastrointestinal: Negative for abdominal pain, constipation, diarrhea, nausea and vomiting.  Genitourinary: Negative for dysuria, frequency and hematuria.  Musculoskeletal: Positive for falls. Negative for back pain, joint pain and neck pain.  Skin:       No acne, rash, or lesions  Neurological: Positive for weakness. Negative for dizziness, tremors and focal weakness.  Endo/Heme/Allergies: Negative for polydipsia. Does not bruise/bleed easily.  Psychiatric/Behavioral: Negative for depression. The patient is not nervous/anxious and does not have insomnia.      VITAL SIGNS:   Vitals:   09/16/17 2030 09/16/17 2200 09/16/17 2215 09/16/17 2230  BP: 139/71 139/82  (!) 151/62  Pulse: 73 75 73 73  Resp: _0 SpO2: 100% 100% 100% 100%  Weight:      Height:       Wt Readings from Last 3 Encounters:  09/16/17 46 kg  09/15/17 46.3 kg   09/13/17 42.6 kg    PHYSICAL EXAMINATION:  Physical Exam  Vitals reviewed. Constitutional: She is oriented to person, place, and time. She appears well-developed and well-nourished. No distress.  HENT:  Head: Normocephalic and atraumatic.  Mouth/Throat: Oropharynx is clear and moist.  Eyes: Pupils are equal, round, and reactive to light. Conjunctivae and EOM are normal. No scleral icterus.  Neck: Normal range of motion. Neck supple. No JVD present. No thyromegaly present.  Cardiovascular: Normal rate, regular rhythm and intact distal pulses. Exam reveals no gallop and no friction rub.  No murmur heard. Respiratory: Effort normal and breath sounds normal. No respiratory distress. She has no wheezes. She has no rales.  GI: Soft. Bowel sounds are normal. She exhibits no distension. There is no tenderness.  Musculoskeletal: Normal range of motion. She exhibits no edema.  No arthritis, no gout  Lymphadenopathy:    She has no cervical adenopathy.  Neurological: She is alert and oriented to person, place, and time. No cranial nerve deficit.  No dysarthria, no aphasia  Skin: Skin is warm and dry. No rash noted. No erythema.  Psychiatric: She has a normal mood and affect. Her behavior is normal. Judgment and thought content normal.    LABORATORY PANEL:   CBC Recent Labs  Lab 09/16/17 1930  WBC 4.7  HGB 10.8*  HCT 32.1*  PLT 156   ------------------------------------------------------------------------------------------------------------------  Chemistries  Recent Labs  Lab 09/16/17 1930  NA 136  K 4.2  CL 97*  CO2 33*  GLUCOSE 118*  BUN 10  CREATININE 0.89  CALCIUM 8.7*  AST 17  ALT 10  ALKPHOS 74  BILITOT 0.5   ------------------------------------------------------------------------------------------------------------------  Cardiac Enzymes No results for input(s): TROPONINI in the last 168  hours. ------------------------------------------------------------------------------------------------------------------  RADIOLOGY:  Ct Head Wo Contrast  Result Date: 09/16/2017 CLINICAL DATA:  Tripped and fell EXAM: CT HEAD WITHOUT CONTRAST CT CERVICAL SPINE WITHOUT CONTRAST TECHNIQUE: Multidetector CT imaging of the head and cervical spine was performed following the standard protocol without intravenous contrast. Multiplanar CT image reconstructions of the cervical spine were also generated. COMPARISON:  CT 09/13/2017, 07/17/2017 FINDINGS: CT HEAD FINDINGS Brain: No acute territorial infarction, hemorrhage or mass is visualized. Enlarged left greater than right extra-axial CSF spaces anteriorly which may reflect atrophy or chronic subdural fluid  collections, no change. Mild small vessel ischemic changes of the white matter. Stable ventricle size Vascular: No hyperdense vessels.  Carotid vascular calcification Skull: No fracture Sinuses/Orbits: Postsurgical changes of the right ethmoid and maxillary sinuses with small fluid level in the right maxillary sinus Other: None CT CERVICAL SPINE FINDINGS Alignment: No subluxation.  Facet alignment within normal limits Skull base and vertebrae: No acute fracture. No primary bone lesion or focal pathologic process. Soft tissues and spinal canal: No prevertebral fluid or swelling. No visible canal hematoma. Disc levels: Mild degenerative changes at C5-C6. Facet degenerative changes at multiple levels. Upper chest: Partially visualized left apical pleural effusion. Apical emphysema Other: None IMPRESSION: 1. No CT evidence for acute intracranial abnormality. Atrophy and small vessel ischemic changes of the white matter. Stable enlarged extra-axial CSF spaces. 2. No acute osseous abnormality of the cervical spine 3. Partially visualized left apical pleural effusion Electronically Signed   By: Donavan Foil M.D.   On: 09/16/2017 18:58   Ct Cervical Spine Wo  Contrast  Result Date: 09/16/2017 CLINICAL DATA:  Tripped and fell EXAM: CT HEAD WITHOUT CONTRAST CT CERVICAL SPINE WITHOUT CONTRAST TECHNIQUE: Multidetector CT imaging of the head and cervical spine was performed following the standard protocol without intravenous contrast. Multiplanar CT image reconstructions of the cervical spine were also generated. COMPARISON:  CT 09/13/2017, 07/17/2017 FINDINGS: CT HEAD FINDINGS Brain: No acute territorial infarction, hemorrhage or mass is visualized. Enlarged left greater than right extra-axial CSF spaces anteriorly which may reflect atrophy or chronic subdural fluid collections, no change. Mild small vessel ischemic changes of the white matter. Stable ventricle size Vascular: No hyperdense vessels.  Carotid vascular calcification Skull: No fracture Sinuses/Orbits: Postsurgical changes of the right ethmoid and maxillary sinuses with small fluid level in the right maxillary sinus Other: None CT CERVICAL SPINE FINDINGS Alignment: No subluxation.  Facet alignment within normal limits Skull base and vertebrae: No acute fracture. No primary bone lesion or focal pathologic process. Soft tissues and spinal canal: No prevertebral fluid or swelling. No visible canal hematoma. Disc levels: Mild degenerative changes at C5-C6. Facet degenerative changes at multiple levels. Upper chest: Partially visualized left apical pleural effusion. Apical emphysema Other: None IMPRESSION: 1. No CT evidence for acute intracranial abnormality. Atrophy and small vessel ischemic changes of the white matter. Stable enlarged extra-axial CSF spaces. 2. No acute osseous abnormality of the cervical spine 3. Partially visualized left apical pleural effusion Electronically Signed   By: Donavan Foil M.D.   On: 09/16/2017 18:58   Dg Chest Portable 1 View  Result Date: 09/16/2017 CLINICAL DATA:  Fall EXAM: PORTABLE CHEST 1 VIEW COMPARISON:  09/13/2017 FINDINGS: Upper normal heart size. Left pleural effusion  and basilar pulmonary opacity. Diffuse interstitial prominence. Right jugular Port-A-Cath tip at the cavoatrial junction. Two level midthoracic vertebral augmentation is stable. IMPRESSION: Stable left pleural effusion and dense basilar pulmonary opacity. Electronically Signed   By: Marybelle Killings M.D.   On: 09/16/2017 19:07    EKG:   Orders placed or performed during the hospital encounter of 09/16/17  . EKG 12-Lead  . EKG 12-Lead    IMPRESSION AND PLAN:  Principal Problem:   UTI due to extended-spectrum beta lactamase (ESBL) producing Escherichia coli -IV antibiotics initiated, urine culture from a couple of days ago is growing ESBL E. coli Active Problems:   COPD with acute exacerbation ([place]) -patient has significant wheezing, and on VBG was found to have pH of 7.2 with CO2 significantly elevated.  We will place  the patient on BiPAP, give IV Solu-Medrol with duo nebs.  Continue other home meds   Chronic systolic CHF (congestive heart failure) ([place]) -continue home medications   CAD (coronary artery disease) -continue home meds   Primary cancer of left lower lobe of lung ([place]) -Per oncology's consult we will get an MRI of her brain   Mixed hyperlipidemia -Home dose antilipid  Chart review performed and case discussed with ED provider. Labs, imaging and/or ECG reviewed by provider and discussed with patient/family. Management plans discussed with the patient and/or family.  DVT PROPHYLAXIS: SubQ lovenox   GI PROPHYLAXIS:  None  ADMISSION STATUS:   Observation  CODE STATUS: Full Code Status History    Date Active Date Inactive Code Status Order ID Comments User Context   07/17/2017 1730 07/20/2017 1407 Full Code [id number]  Vaughan Basta, MD Inpatient   06/30/2017 1718 07/02/2017 2146 Full Code [id number]  Fritzi Mandes, MD Inpatient   05/21/2017 1813 05/24/2017 1542 Full Code [id number]  Demetrios Loll, MD Inpatient   03/04/2016 1835 03/06/2016 2112 Full Code [id number]  Dustin Flock, MD  Inpatient   12/04/2015 1728 12/07/2015 1858 Full Code [id number]  Lytle Butte, MD ED   11/15/2015 0231 11/29/2015 1830 Full Code [id number]  Lance Coon, MD Inpatient    Advance Directive Documentation     Most Recent Value  Type of Advance Directive  Healthcare Power of Attorney, Living will  Pre-existing out of facility DNR order (yellow form or pink MOST form)  -  "MOST" Form in Place?  -      TOTAL TIME TAKING CARE OF THIS PATIENT: 40 minutes.   Davidmichael Zarazua [place] 09/16/2017, 11:04 PM  [facility]  Office  [phone]  CC: Primary care physician; Tracie Harrier, MD  Note:  This document was prepared using Dragon voice recognition software and may include unintentional dictation errors.

## 2017-09-16 NOTE — Consult Note (Signed)
Name: [name] [name] MRN: [id number] DOB: [birth year]    ADMISSION DATE:  09/16/2017 CONSULTATION DATE: 09/16/2017  REFERRING MD : Dr. Jannifer Franklin   CHIEF COMPLAINT: Fall   BRIEF PATIENT DESCRIPTION:  77 yo female admitted with frequent falls at home, acute encephalopathy secondary to hypercapnia and UTI, and acute on chronic hypoxic hypercapnic respiratory failure secondary to squamous cell lung cancer and left pleural effusion requiring Bipap   SIGNIFICANT EVENTS/STUDIES:  09/4 Pt admitted to stepdown unit on Bipap  09/4 CT Cervical Spine/Head revealed No CT evidence for acute intracranial abnormality. Atrophy and small vessel ischemic changes of the white matter. Stable enlarged extra-axial CSF spaces. No acute osseous abnormality of the cervical spine Partially visualized left apical pleural effusion  HISTORY OF PRESENT ILLNESS:   This is a 77 yo female with a PMH of Chronic Home O2 _0 .5L, Rheumatoid Arthritis, Nicotine Abuse, Neuropathy, HTN, Fibromyalgia, COPD, Dizziness, Left Breast Cancer (dx 2009), Arthritis, Chronic UTI, CHF, and Squamous Cell Lung Cancer s/p chemotherapy and radiation dx 07/01/16 (bronch performed on 02/09/17 positive for SqCa, due to decline in pts condition oncology recommended hospice, however pt declined at that time).  She presented to [facility] ER on 09/4 with c/o frequent falls and worsening confusion. She previously presented to [facility] ER on 09/1 due to similar symptoms and was diagnosed with a UTI.  She received 1 dose of  3g fosfomycin due to prior urine cultures showing ESBL resistance pattern and urine culture obtained, she did not require hospital admission. On 09/4 pts son contacted pts oncologist via telephone to report pts confusion had worsened, therefore he was instructed to take his mother to the ER for further evaluation.  In the ER lab results revealed lactic acid 0.5 and venous gas pH 7.21/pCO2 95.  CT Head negative for acute intracranial abnormality and CXR  revealed left pleural effusion and basilar pulmonary opacity.  She remained confused, therefore she was placed on Bipap due to hypercapnia. She was subsequently admitted to the stepdown unit by hospitalist team for further workup and treatment.   She recently saw her outpatient pulmonologist Dr. Ashby Dawes on 09/15/17, she c/o of shortness of breath at that time. Dr. Ashby Dawes discussed CXR results revealing small left base effusion, and drainage of left pleural effusion would likely give minimal/temporary relief due to squamous cell lung cancer.  He discussed goals of treatment and code status with the pt.  Per Dr. Mathis Fare progress note from office visit she was agreeable to hospice and confirmed she wanted to change code status to DNR, although her family has had a hard time accepting decline in pt condition.    PAST MEDICAL HISTORY :   has a past medical history of Asthma, Breast cancer ([place]) (2009), Cancer [facility]), CHF (congestive heart failure) ([place]), CHF (congestive heart failure) ([place]), Chronic UTI, COPD (chronic obstructive pulmonary disease) ([place]), Dizziness, Fibromyalgia, Hypertension, Neuropathy, Personal history of tobacco use, presenting hazards to health (05/17/2015), Polyp, larynx, RA (rheumatoid arthritis) ([place]), Sinus infection, Stumbling gait, and Supplemental oxygen dependent.  has a past surgical history that includes Eye muscle surgery (Right); Abdominal hysterectomy; Thumb arthroscopy (Left); Cyst excision (Left, 02/27/2015); Breast lumpectomy (Left, 2009); Flexible bronchoscopy (N/A, 07/01/2016); Flexible bronchoscopy (N/A, 02/09/2017); PORTA CATH INSERTION (N/A, 03/04/2017); and Kyphoplasty (N/A, 05/22/2017). Prior to Admission medications   Medication Sig Start Date End Date Taking? Authorizing Provider  albuterol (PROVENTIL HFA;VENTOLIN HFA) 108 (90 Base) MCG/ACT inhaler Inhale 1-2 puffs into the lungs every 6 (six) hours as needed for wheezing or  shortness of breath. Patient  taking differently: Inhale 2 puffs into the lungs every 6 (six) hours as needed for wheezing or shortness of breath.  02/16/17   [name], MD  AMBULATORY NON FORMULARY MEDICATION Medication Name: nebulizer and supplies DX: J96.11/J44.9 BWL:SLHTD 09/15/17   [name], MD  clonazePAM (KLONOPIN) 1 MG tablet Take 1 mg by mouth daily as needed for anxiety.     [provider]  fentaNYL (DURAGESIC - DOSED MCG/HR) 50 MCG/HR Place 1 patch (50 mcg total) onto the skin every 3 (three) days. 08/24/17   Karen Kitchens, NP  gabapentin (NEURONTIN) 600 MG tablet Take 600 mg by mouth 3 (three) times daily.  05/25/12   [provider]  HYDROcodone-acetaminophen (NORCO/VICODIN) 5-325 MG tablet Take 1 tablet by mouth 3 (three) times daily as needed. 08/24/17   Karen Kitchens, NP  LORazepam (ATIVAN) 0.5 MG tablet Take 1 tablet (0.5 mg total) by mouth every 6 (six) hours as needed (Nausea or vomiting). 03/19/17   Lequita Asal, MD  nitroGLYCERIN (NITROSTAT) 0.4 MG SL tablet Place 0.4 mg under the tongue every 5 (five) minutes as needed for chest pain.     [provider]  ondansetron (ZOFRAN) 8 MG tablet Take 1 tablet (8 mg total) by mouth 2 (two) times daily as needed. Start on the third day after chemotherapy. Patient taking differently: Take 8 mg by mouth 2 (two) times daily as needed for nausea or vomiting. Start on the third day after chemotherapy. 03/19/17   Lequita Asal, MD  potassium chloride SA (K-DUR,KLOR-CON) 20 MEQ tablet Take 1 tablet (20 mEq total) by mouth daily for 5 days. 08/24/17 08/29/17  Karen Kitchens, NP  predniSONE (STERAPRED UNI-PAK 21 TAB) 10 MG (21) TBPK tablet Taper by 10 mg p.o. daily 07/20/17   Epifanio Lesches, MD  SPIRIVA RESPIMAT 2.5 MCG/ACT AERS Inhale 2.5 mcg into the lungs 2 (two) times daily. 09/10/17   [name], MD  zolpidem (AMBIEN) 5 MG tablet Take 5 mg by mouth at bedtime as needed for sleep.     [provider]    Allergies  Allergen Reactions  . Contrast Media [Iodinated Diagnostic Agents] Shortness Of Breath    Acute onset of shortness of breath following CT contrast administration 06/18/17. Sent to the ED. Dr. Kerman Passey documented incident as an allergic reaction. Needs premedications prior to future contrast media injections. Previous questionable contrast allergy, so the patient was given entire or partial premedications without complications. She was not given any premedications on 06/18/17, resulting in shortness of breath and an ED visit.  . Sulfa Antibiotics Other (See Comments)    Unknown  . Tetracycline Hives  . White Petrolatum Other (See Comments)    Blisters  . Amoxicillin-Pot Clavulanate Rash and Other (See Comments)    Blisters in mouth Has patient had a PCN reaction causing immediate rash, facial/tongue/throat swelling, SOB or lightheadedness with hypotension: No Has patient had a PCN reaction causing severe rash involving mucus membranes or skin necrosis: No Has patient had a PCN reaction that required hospitalization: No Has patient had a PCN reaction occurring within the last 10 years: No If all of the above answers are "NO", then may proceed with Cephalosporin use.   . Tape Rash    FAMILY HISTORY:  family history includes CAD in her sister; Diabetes in her father; Heart attack in her father; Stroke in her father. SOCIAL HISTORY:  reports that she has been smoking cigarettes. She has a  40.00 pack-year smoking history. She has never used smokeless tobacco. She reports that she does not drink alcohol or use drugs.  REVIEW OF SYSTEMS: Positives in BOLD  Constitutional: poor po intake, fever, chills, weight loss, malaise/fatigue and diaphoresis.  HENT: Negative for hearing loss, ear pain, nosebleeds, congestion, sore throat, neck pain, tinnitus and ear discharge.   Eyes: Negative for blurred vision, double vision, photophobia, pain, discharge and redness.  Respiratory: Negative  for cough, hemoptysis, sputum production, shortness of breath, wheezing and stridor.   Cardiovascular: Negative for chest pain, palpitations, orthopnea, claudication, leg swelling and PND.  Gastrointestinal: Negative for heartburn, nausea, vomiting, abdominal pain, diarrhea, constipation, blood in stool and melena.  Genitourinary: Negative for dysuria, urgency, frequency, hematuria and flank pain.  Musculoskeletal: myalgias, back pain, joint pain and falls.  Skin: Negative for itching and rash.  Neurological: Negative for dizziness, tingling, tremors, sensory change, speech change, focal weakness, seizures, loss of consciousness, weakness and headaches.  Endo/Heme/Allergies: Negative for environmental allergies and polydipsia. Does not bruise/bleed easily.  SUBJECTIVE:  No complaints at this time   VITAL SIGNS: Pulse Rate:  [73-93] 74 (09/04 2300) Resp:  [10-18] 14 (09/04 2300) BP: (135-156)/(62-90) 156/90 (09/04 2300) SpO2:  [93 %-100 %] 100 % (09/04 2300) Weight:  [46 kg] 46 kg (09/04 1714)  PHYSICAL EXAMINATION: General: cachetic frail elderly female, NAD on Bipap  Neuro: alert and oriented, follows commands  HEENT: supple, no JVD  Cardiovascular: nsr, rrr, no R/G Lungs: expiratory wheezes with rhonchi present, even, non labored  Abdomen: +BS x4, soft, non tender, non distended  Musculoskeletal: moves all extremities, no edema  Skin: intact no rashes or lesions   Recent Labs  Lab 09/13/17 1851 09/16/17 1930  NA 137 136  K 4.7 4.2  CL 99 97*  CO2 32 33*  BUN 17 10  CREATININE 1.06* 0.89  GLUCOSE 113* 118*   Recent Labs  Lab 09/13/17 1851 09/16/17 1930  HGB 11.8* 10.8*  HCT 35.4 32.1*  WBC 7.2 4.7  PLT 143* 156   Ct Head Wo Contrast  Result Date: 09/16/2017 CLINICAL DATA:  Tripped and fell EXAM: CT HEAD WITHOUT CONTRAST CT CERVICAL SPINE WITHOUT CONTRAST TECHNIQUE: Multidetector CT imaging of the head and cervical spine was performed following the standard protocol  without intravenous contrast. Multiplanar CT image reconstructions of the cervical spine were also generated. COMPARISON:  CT 09/13/2017, 07/17/2017 FINDINGS: CT HEAD FINDINGS Brain: No acute territorial infarction, hemorrhage or mass is visualized. Enlarged left greater than right extra-axial CSF spaces anteriorly which may reflect atrophy or chronic subdural fluid collections, no change. Mild small vessel ischemic changes of the white matter. Stable ventricle size Vascular: No hyperdense vessels.  Carotid vascular calcification Skull: No fracture Sinuses/Orbits: Postsurgical changes of the right ethmoid and maxillary sinuses with small fluid level in the right maxillary sinus Other: None CT CERVICAL SPINE FINDINGS Alignment: No subluxation.  Facet alignment within normal limits Skull base and vertebrae: No acute fracture. No primary bone lesion or focal pathologic process. Soft tissues and spinal canal: No prevertebral fluid or swelling. No visible canal hematoma. Disc levels: Mild degenerative changes at C5-C6. Facet degenerative changes at multiple levels. Upper chest: Partially visualized left apical pleural effusion. Apical emphysema Other: None IMPRESSION: 1. No CT evidence for acute intracranial abnormality. Atrophy and small vessel ischemic changes of the white matter. Stable enlarged extra-axial CSF spaces. 2. No acute osseous abnormality of the cervical spine 3. Partially visualized left apical pleural effusion Electronically Signed   By:  Donavan Foil M.D.   On: 09/16/2017 18:58   Ct Cervical Spine Wo Contrast  Result Date: 09/16/2017 CLINICAL DATA:  Tripped and fell EXAM: CT HEAD WITHOUT CONTRAST CT CERVICAL SPINE WITHOUT CONTRAST TECHNIQUE: Multidetector CT imaging of the head and cervical spine was performed following the standard protocol without intravenous contrast. Multiplanar CT image reconstructions of the cervical spine were also generated. COMPARISON:  CT 09/13/2017, 07/17/2017 FINDINGS: CT  HEAD FINDINGS Brain: No acute territorial infarction, hemorrhage or mass is visualized. Enlarged left greater than right extra-axial CSF spaces anteriorly which may reflect atrophy or chronic subdural fluid collections, no change. Mild small vessel ischemic changes of the white matter. Stable ventricle size Vascular: No hyperdense vessels.  Carotid vascular calcification Skull: No fracture Sinuses/Orbits: Postsurgical changes of the right ethmoid and maxillary sinuses with small fluid level in the right maxillary sinus Other: None CT CERVICAL SPINE FINDINGS Alignment: No subluxation.  Facet alignment within normal limits Skull base and vertebrae: No acute fracture. No primary bone lesion or focal pathologic process. Soft tissues and spinal canal: No prevertebral fluid or swelling. No visible canal hematoma. Disc levels: Mild degenerative changes at C5-C6. Facet degenerative changes at multiple levels. Upper chest: Partially visualized left apical pleural effusion. Apical emphysema Other: None IMPRESSION: 1. No CT evidence for acute intracranial abnormality. Atrophy and small vessel ischemic changes of the white matter. Stable enlarged extra-axial CSF spaces. 2. No acute osseous abnormality of the cervical spine 3. Partially visualized left apical pleural effusion Electronically Signed   By: Donavan Foil M.D.   On: 09/16/2017 18:58   Dg Chest Portable 1 View  Result Date: 09/16/2017 CLINICAL DATA:  Fall EXAM: PORTABLE CHEST 1 VIEW COMPARISON:  09/13/2017 FINDINGS: Upper normal heart size. Left pleural effusion and basilar pulmonary opacity. Diffuse interstitial prominence. Right jugular Port-A-Cath tip at the cavoatrial junction. Two level midthoracic vertebral augmentation is stable. IMPRESSION: Stable left pleural effusion and dense basilar pulmonary opacity. Electronically Signed   By: Marybelle Killings M.D.   On: 09/16/2017 19:07    ASSESSMENT / PLAN:  Acute on chronic hypoxic hypercapnic respiratory failure  secondary to squamous cell lung cancer and left pleural effusion  Hx: COPD and Current everyday smoker   Prn Bipap for dyspnea and/or hypoxia IV and nebulized steroids  Prn bronchodilator therapy  Repeat CXR in am  Smoking cessation counseling provided  Oncology consulted appreciate input   UTI  Trend WBC and monitor fever curve  Trend lactic acid and PCT  Follow cultures  Continue meropenem  Trend BMP  Replace electrolytes as indicated Monitor UOP   Frequent falls  Acute encephalopathy likely secondary to hypercapnia and UTI-improving  Avoid sedating medications  Protein calorie malnutrition  Changed to regular diet Dietitian consulted appreciate input    VTE px: subq lovenox   -I spoke with pt regarding codes status she confirmed she is a DO NOT RESUSCITATE.  She states her son is aware of her code status, however he is having a difficult time accepting pts decision to change her code status to DNR. Pt is agreeable to complete advance directive during this hospitalization.   [name], [place] Pager [phone] (please enter 7 digits) PCCM Consult Pager [phone] (please enter 7 digits)

## 2017-09-16 NOTE — Progress Notes (Signed)
Pharmacy Antibiotic Note  [name] [name] is a 77 y.o. female admitted on 09/16/2017 with UTI.  Pharmacy has been consulted for meropenem dosing.  Plan: Meropenem 1 gram q 12 hours ordered.  Height: 5\' 4"  (162.6 cm) Weight: 101 lb 6.6 oz (46 kg) IBW/kg (Calculated) : 54.7  No data recorded.  Recent Labs  Lab 09/13/17 1851 09/16/17 1930 09/16/17 1931  WBC 7.2 4.7  --   CREATININE 1.06* 0.89  --   LATICACIDVEN  --   --  0.5    Estimated Creatinine Clearance: 40.3 mL/min (by C-G formula based on SCr of 0.89 mg/dL).    Allergies  Allergen Reactions  . Contrast Media [Iodinated Diagnostic Agents] Shortness Of Breath    Acute onset of shortness of breath following CT contrast administration 06/18/17. Sent to the ED. Dr. Kerman Passey documented incident as an allergic reaction. Needs premedications prior to future contrast media injections. Previous questionable contrast allergy, so the patient was given entire or partial premedications without complications. She was not given any premedications on 06/18/17, resulting in shortness of breath and an ED visit.  . Sulfa Antibiotics Other (See Comments)    Unknown  . Tetracycline Hives  . White Petrolatum Other (See Comments)    Blisters  . Amoxicillin-Pot Clavulanate Rash and Other (See Comments)    Blisters in mouth Has patient had a PCN reaction causing immediate rash, facial/tongue/throat swelling, SOB or lightheadedness with hypotension: No Has patient had a PCN reaction causing severe rash involving mucus membranes or skin necrosis: No Has patient had a PCN reaction that required hospitalization: No Has patient had a PCN reaction occurring within the last 10 years: No If all of the above answers are "NO", then may proceed with Cephalosporin use.   . Tape Rash    Antimicrobials this admission: 9/5 meropenem  >>    >>   Dose adjustments this admission:   Microbiology results: 9/4 BCx: pending 9/1 UCx: >100K E coli ESBL        9/4 UA: LE(+) NO2(-)  WBC 21-50 Thank you for allowing pharmacy to be a part of this patient's care.  Sharonda Llamas S 09/16/2017 11:55 PM

## 2017-09-16 NOTE — ED Notes (Signed)
RN Paulette tried to gain blood from pt's port with no luck either. MD aware

## 2017-09-16 NOTE — Telephone Encounter (Signed)
Yes. Recall that we referred her, however when they went out to admit, she ultimately refused. They have an active order from [place]. The paperwork has already been completed. They can switch the referral back to [place] if need be.   Gaspar Bidding

## 2017-09-16 NOTE — ED Notes (Signed)
Pt taken to CT.

## 2017-09-16 NOTE — Telephone Encounter (Signed)
Patient son called reporting that patient has an infection, went to ER, was given some medicine and that she is very confused and that something needs to be done for it. They have hired someone to stay with her while they work. Please advise

## 2017-09-16 NOTE — ED Notes (Signed)
MD at bedside attempting IV [place]

## 2017-09-16 NOTE — Telephone Encounter (Signed)
  I am unsure what is going on.  We can't manage this over the phone.  She would need to be seen by someone.  M

## 2017-09-16 NOTE — ED Notes (Signed)
Pt assisted to toilet 

## 2017-09-16 NOTE — ED Notes (Signed)
Pt requesting port accessed, no PIV, but must have emla applied prior to.

## 2017-09-16 NOTE — ED Notes (Signed)
MRI called and spoke with patient

## 2017-09-16 NOTE — Telephone Encounter (Signed)
It is more than 15 days (which it is) it is no longer valid. VO given for services

## 2017-09-16 NOTE — ED Notes (Signed)
Report given to Allison.RN

## 2017-09-16 NOTE — Telephone Encounter (Addendum)
Just saw this and do not see that anyone started her on oral antibiotics, she did get an antibiotic in ER. Please advise if you want to start her on antibiotics today as she will not be able to be seen today. 1948 UA shows clear UTI. Due to allergy profile, and prior urine cultures showing ESBL resistance pattern, I will give her a 3 g dose of fosfomycin.  Urine culture is pending.  Plan for close outpatient follow-up this week for continued monitoring of her symptoms.  If she has worsening of her condition or fever she will need to return to the hospital for IV antibiotic's.  Nitrite(!): POSITIVE [PS]     Urine culture  Order: [id number]  Status:  Final result Visible to patient:  No (Not Released) Next appt:  09/27/2017 at 11:00 AM in Radiology ([facility])  Specimen Information: Urine, Random     Component 3d ago  Specimen Description URINE, RANDOM  Performed at [facility], [address], [place], [place] [address]   Special Requests NONE  Performed at [facility], [address] [place], [place] [address]   Culture Abnormal   >=100,000 COLONIES/mL ESCHERICHIA COLI  Confirmed Extended Spectrum Beta-Lactamase Producer (ESBL). In bloodstream infections from ESBL organisms, carbapenems are preferred over piperacillin/tazobactam. They are shown to have a lower risk of mortality.    Report Status 09/16/2017 FINAL   Organism ID, Bacteria ESCHERICHIA COLIAbnormal    Resulting Agency [facility]  Susceptibility    Escherichia coli    MIC    AMPICILLIN >=32 RESIST... Resistant    AMPICILLIN/SULBACTAM 16 INTERMED... Intermediate    CEFAZOLIN >=64 RESIST... Resistant    CEFTRIAXONE >=64 RESIST... Resistant    CIPROFLOXACIN >=4 RESISTANT  Resistant    Extended ESBL POSITIVE  Resistant    GENTAMICIN <=1 SENSITIVE  Sensitive    IMIPENEM <=0.25 SENS... Sensitive    NITROFURANTOIN 32 SENSITIVE  Sensitive    PIP/TAZO <=4 SENSITIVE  Sensitive    TRIMETH/SULFA >=320  RESIS... Resistant         Susceptibility Comments   Escherichia coli  >=100,000 COLONIES/mL ESCHERICHIA COLI      Specimen Collected: 09/13/17 19:17 Last Resulted: 09/16/17 07:13     Lab Flowsheet    Order Details    View Encounter    Lab and Collection Details    Routing    Result History          Other Results from 09/13/2017   Contains abnormal data Urinalysis, Complete w Microscopic  Order: [id number]   Status:  Final result Visible to patient:  Yes (MyChart) Next appt:  09/27/2017 at 11:00 AM in Radiology ([facility])   Ref Range & Units 3d ago  Color, Urine YELLOW YELLOWAbnormal    APPearance CLEAR HAZYAbnormal    Specific Gravity, Urine 1.005 - 1.030 1.016   pH 5.0 - 8.0 5.0   Glucose, UA NEGATIVE mg/dL NEGATIVE   Hgb urine dipstick NEGATIVE SMALLAbnormal    Bilirubin Urine NEGATIVE NEGATIVE   Ketones, ur NEGATIVE mg/dL NEGATIVE   Protein, ur NEGATIVE mg/dL NEGATIVE   Nitrite NEGATIVE POSITIVEAbnormal    Leukocytes, UA NEGATIVE LARGEAbnormal    RBC / HPF 0 - 5 RBC/hpf 6-10   WBC, UA 0 - 5 WBC/hpf 21-50   Bacteria, UA NONE SEEN MANYAbnormal    Squamous Epithelial / LPF 0 - 5 0-5   Non Squamous Epithelial NONE SEEN PRESENTAbnormal    Comment: Performed at [facility], [address] [place], [place] [address]  Resulting Agency  [facility]      Specimen Collected: 09/13/17 19:17 Last Resulted: 09/13/17 19:34

## 2017-09-16 NOTE — Telephone Encounter (Signed)
Received fax from [place] requesting refill for pain Hydrocodone/Acetaminophen 5/325 mg.  Called patient to ascertain whether or not she is needing a refill.  Patient states she does.  Patient states she saw Dr. Cleda Clarks yesterday and was told there is nothing more that he can do for her. He has estimated that she has 6 months to 1 year.

## 2017-09-16 NOTE — ED Provider Notes (Addendum)
[facility] Emergency Department Provider Note    First MD Initiated Contact with Patient 09/16/17 1708     (approximate)  I have reviewed the triage vital signs and the nursing notes.   HISTORY  Chief Complaint Fall and Abnormal Lab    HPI [name] TULLER is a 77 y.o. female with extensive past medical history with notes, cell carcinoma undergoing chemotherapy at this time as well as history of CHF and COPD with chronic O2 requirement with recent evaluation in the ER for frequent falls and diagnosed with UTI presents the ER for episode of confusion earlier today and persisting and increasing falls over the past 2 days.  Patient did get a dose of fosfomycin.  Denies any dysuria but was not having symptomatology at that time of diagnosis.  Denies any new numbness or tingling.  Family states they have noticed significant increase in confusion over the past day or so.    Past Medical History:  Diagnosis Date  . Asthma   . Breast cancer ([place]) 2009   left  . Cancer ([place])   . CHF (congestive heart failure) ([place])   . CHF (congestive heart failure) ([place])   . Chronic UTI   . COPD (chronic obstructive pulmonary disease) ([place])   . Dizziness   . Fibromyalgia   . Hypertension   . Neuropathy   . Personal history of tobacco use, presenting hazards to health 05/17/2015  . Polyp, larynx   . RA (rheumatoid arthritis) ([place])   . Sinus infection    recent  . Stumbling gait    to the left  . Supplemental oxygen dependent    2.5l   Family History  Problem Relation Age of Onset  . Diabetes Father   . Stroke Father   . Heart attack Father   . CAD Sister    Past Surgical History:  Procedure Laterality Date  . ABDOMINAL HYSTERECTOMY    . BREAST LUMPECTOMY Left 2009   chemo and radiation  . CYST EXCISION Left 02/27/2015   Procedure: CYST REMOVAL;  Surgeon: Hessie Knows, MD;  Location: [facility] ORS;  Service: Orthopedics;  Laterality: Left;  . EYE MUSCLE SURGERY Right      13 surgeries  . FLEXIBLE BRONCHOSCOPY N/A 07/01/2016   Procedure: FLEXIBLE BRONCHOSCOPY;  Surgeon: Wilhelmina Mcardle, MD;  Location: [facility] ORS;  Service: Pulmonary;  Laterality: N/A;  . FLEXIBLE BRONCHOSCOPY N/A 02/09/2017   Procedure: FLEXIBLE BRONCHOSCOPY;  Surgeon: [name], MD;  Location: [facility] ORS;  Service: Pulmonary;  Laterality: N/A;  . KYPHOPLASTY N/A 05/22/2017   Procedure: Celine Ahr;  Surgeon: Hessie Knows, MD;  Location: [facility] ORS;  Service: Orthopedics;  Laterality: N/A;  . PORTA CATH INSERTION N/A 03/04/2017   Procedure: PORTA CATH INSERTION;  Surgeon: [name], MD;  Location: [place] [facility];  Service: Cardiovascular;  Laterality: N/A;  . THUMB ARTHROSCOPY Left    Patient Active Problem List   Diagnosis Date Noted  . Neoplastic malignant related fatigue 08/20/2017  . Acute respiratory failure ([place]) 07/17/2017  . Encephalopathy acute 06/30/2017  . UTI due to extended-spectrum beta lactamase (ESBL) producing Escherichia coli 05/21/2017  . Acute midline thoracic back pain 05/09/2017  . Compression fracture of T7 vertebra (HCC) 05/08/2017  . Rib pain on right side 05/08/2017  . Encounter for antineoplastic immunotherapy 05/08/2017  . B12 deficiency 04/29/2017  . Anemia 04/11/2017  . History of recurrent UTIs 04/11/2017  . Diarrhea 03/28/2017  . Rib lesion 03/03/2017  . Cancer (  [place]) 02/16/2017  . Goals of care, counseling/discussion 02/02/2017  . Collapse of left lung 02/02/2017  . Pleural effusion, left 02/02/2017  . HCAP (healthcare-associated pneumonia) 11/04/2016  . Recurrent UTI 09/08/2016  . Lung mass 09/08/2016  . Encounter for antineoplastic chemotherapy 07/24/2016  . Primary cancer of left lower lobe of lung ([place]) 06/25/2016  . Mixed hyperlipidemia 06/19/2016  . Peripheral neuropathy 06/19/2016  . Aneurysm of thoracic aorta ([place]) 04/29/2016  . Pain in limb 04/29/2016  . Acute delirium 03/06/2016  . Acute renal insufficiency  03/06/2016  . Pressure injury of skin 03/05/2016  . UTI (urinary tract infection) 03/04/2016  . Acute renal failure (ARF) ([place]) 12/04/2015  . Protein malnutrition ([place]) 11/24/2015  . Acute respiratory failure with hypoxia ([place])   . Hyponatremia   . Chronic systolic CHF (congestive heart failure) ([place]) 11/15/2015  . Sepsis ([place]) 11/15/2015  . Descending thoracic aortic aneurysm ([place]) 09/11/2015  . Personal history of tobacco use, presenting hazards to health 05/17/2015  . COPD, moderate ([place]) 05/19/2014  . Abnormal cardiovascular stress test 03/10/2014  . CAD (coronary artery disease) 02/25/2014  . Aneurysm of abdominal vessel ([place]) 02/24/2014  . Shortness of breath 02/24/2014  . Vocal cord polyp 02/13/2014  . Narcotic drug use 01/17/2013  . Fibromyalgia 05/11/2012  . Low back pain 05/11/2012  . Arthritis 11/03/2011  . Asthma 11/03/2011  . Breast cancer ([place]) 11/03/2011  . Osteoarthritis 06/17/2011      Prior to Admission medications   Medication Sig Start Date End Date Taking? Authorizing Provider  albuterol (PROVENTIL HFA;VENTOLIN HFA) 108 (90 Base) MCG/ACT inhaler Inhale 1-2 puffs into the lungs every 6 (six) hours as needed for wheezing or shortness of breath. Patient taking differently: Inhale 2 puffs into the lungs every 6 (six) hours as needed for wheezing or shortness of breath.  02/16/17   [name], MD  AMBULATORY NON FORMULARY MEDICATION Medication Name: nebulizer and supplies DX: J96.11/J44.9 LKG:MWNUU 09/15/17   [name], MD  clonazePAM (KLONOPIN) 1 MG tablet Take 1 mg by mouth daily as needed for anxiety.     [provider]  fentaNYL (DURAGESIC - DOSED MCG/HR) 50 MCG/HR Place 1 patch (50 mcg total) onto the skin every 3 (three) days. 08/24/17   Karen Kitchens, NP  gabapentin (NEURONTIN) 600 MG tablet Take 600 mg by mouth 3 (three) times daily.  05/25/12   [provider]  HYDROcodone-acetaminophen (NORCO/VICODIN) 5-325 MG tablet  Take 1 tablet by mouth 3 (three) times daily as needed. 08/24/17   Karen Kitchens, NP  LORazepam (ATIVAN) 0.5 MG tablet Take 1 tablet (0.5 mg total) by mouth every 6 (six) hours as needed (Nausea or vomiting). 03/19/17   Lequita Asal, MD  nitroGLYCERIN (NITROSTAT) 0.4 MG SL tablet Place 0.4 mg under the tongue every 5 (five) minutes as needed for chest pain.     [provider]  ondansetron (ZOFRAN) 8 MG tablet Take 1 tablet (8 mg total) by mouth 2 (two) times daily as needed. Start on the third day after chemotherapy. Patient taking differently: Take 8 mg by mouth 2 (two) times daily as needed for nausea or vomiting. Start on the third day after chemotherapy. 03/19/17   Lequita Asal, MD  potassium chloride SA (K-DUR,KLOR-CON) 20 MEQ tablet Take 1 tablet (20 mEq total) by mouth daily for 5 days. 08/24/17 08/29/17  Karen Kitchens, NP  predniSONE (STERAPRED UNI-PAK 21 TAB) 10 MG (21) TBPK tablet Taper by 10 mg p.o. daily 07/20/17  Epifanio Lesches, MD  SPIRIVA RESPIMAT 2.5 MCG/ACT AERS Inhale 2.5 mcg into the lungs 2 (two) times daily. 09/10/17   [name], MD  zolpidem (AMBIEN) 5 MG tablet Take 5 mg by mouth at bedtime as needed for sleep.     [provider]    Allergies Contrast media [iodinated diagnostic agents]; Sulfa antibiotics; Tetracycline; White petrolatum; Amoxicillin-pot clavulanate; and Tape    Social History Social History   Tobacco Use  . Smoking status: Current Every Day Smoker    Packs/day: 1.00    Years: 40.00    Pack years: 40.00    Types: Cigarettes  . Smokeless tobacco: Never Used  Substance Use Topics  . Alcohol use: No  . Drug use: No    Review of Systems Patient denies headaches, rhinorrhea, blurry vision, numbness, shortness of breath, chest pain, edema, cough, abdominal pain, nausea, vomiting, diarrhea, dysuria, fevers, rashes or hallucinations unless otherwise stated above in  HPI. ____________________________________________   PHYSICAL EXAM:  VITAL SIGNS: Vitals:   09/16/17 2230 09/16/17 2300  BP: (!) 151/62 (!) 156/90  Pulse: 73 74  Resp: 11 14  SpO2: 100% 100%    Constitutional: Alert and oriented.  Eyes: Conjunctivae are normal. Frail and chronically Ill appearing Head: Atraumatic. Nose: No congestion/rhinnorhea. Mouth/Throat: Mucous membranes are moist.   Neck: No stridor. Painless ROM.  Cardiovascular: Normal rate, regular rhythm. Grossly normal heart sounds.  Good peripheral circulation. Respiratory: Normal respiratory effort.  No retractions. Lungs CTAB. Gastrointestinal: Soft and nontender. No distention. No abdominal bruits. No CVA tenderness. Genitourinary: deferred Musculoskeletal: No lower extremity tenderness nor edema.  No joint effusions. Neurologic:  Normal speech and language. No gross focal neurologic deficits are appreciated. No facial droop Skin:  Skin is warm, dry and intact. No rash noted. Psychiatric: Mood and affect are normal. Speech and behavior are normal.  ____________________________________________   LABS (all labs ordered are listed, but only abnormal results are displayed)  Results for orders placed or performed during the hospital encounter of 09/16/17 (from the past 24 hour(s))  CBC with Differential/Platelet     Status: Abnormal   Collection Time: 09/16/17  7:30 PM  Result Value Ref Range   WBC 4.7 3.6 - 11.0 K/uL   RBC 3.57 (L) 3.80 - 5.20 MIL/uL   Hemoglobin 10.8 (L) 12.0 - 16.0 g/dL   HCT 32.1 (L) 35.0 - 47.0 %   MCV 90.0 80.0 - 100.0 fL   MCH 30.2 26.0 - 34.0 pg   MCHC 33.5 32.0 - 36.0 g/dL   RDW 17.0 (H) 11.5 - 14.5 %   Platelets 156 150 - 440 K/uL   Neutrophils Relative % 60 %   Neutro Abs 2.8 1.4 - 6.5 K/uL   Lymphocytes Relative 22 %   Lymphs Abs 1.0 1.0 - 3.6 K/uL   Monocytes Relative 11 %   Monocytes Absolute 0.5 0.2 - 0.9 K/uL   Eosinophils Relative 6 %   Eosinophils Absolute 0.3 0 - 0.7  K/uL   Basophils Relative 1 %   Basophils Absolute 0.0 0 - 0.1 K/uL  Comprehensive metabolic panel     Status: Abnormal   Collection Time: 09/16/17  7:30 PM  Result Value Ref Range   Sodium 136 135 - 145 mmol/L   Potassium 4.2 3.5 - 5.1 mmol/L   Chloride 97 (L) 98 - 111 mmol/L   CO2 33 (H) 22 - 32 mmol/L   Glucose, Bld 118 (H) 70 - 99 mg/dL   BUN 10 8 -  23 mg/dL   Creatinine, Ser 0.89 0.44 - 1.00 mg/dL   Calcium 8.7 (L) 8.9 - 10.3 mg/dL   Total Protein 6.2 (L) 6.5 - 8.1 g/dL   Albumin 3.1 (L) 3.5 - 5.0 g/dL   AST 17 15 - 41 U/L   ALT 10 0 - 44 U/L   Alkaline Phosphatase 74 38 - 126 U/L   Total Bilirubin 0.5 0.3 - 1.2 mg/dL   GFR calc non Af Amer >60 >60 mL/min   GFR calc Af Amer >60 >60 mL/min   Anion gap 6 5 - 15  Urinalysis, Complete w Microscopic     Status: Abnormal   Collection Time: 09/16/17  7:30 PM  Result Value Ref Range   Color, Urine YELLOW (A) YELLOW   APPearance CLEAR (A) CLEAR   Specific Gravity, Urine 1.009 1.005 - 1.030   pH 5.0 5.0 - 8.0   Glucose, UA NEGATIVE NEGATIVE mg/dL   Hgb urine dipstick NEGATIVE NEGATIVE   Bilirubin Urine NEGATIVE NEGATIVE   Ketones, ur NEGATIVE NEGATIVE mg/dL   Protein, ur NEGATIVE NEGATIVE mg/dL   Nitrite NEGATIVE NEGATIVE   Leukocytes, UA MODERATE (A) NEGATIVE   RBC / HPF 0-5 0 - 5 RBC/hpf   WBC, UA 21-50 0 - 5 WBC/hpf   Bacteria, UA RARE (A) NONE SEEN   Squamous Epithelial / LPF 0-5 0 - 5   Mucus PRESENT    Non Squamous Epithelial PRESENT (A) NONE SEEN  Lactic acid, plasma     Status: None   Collection Time: 09/16/17  7:31 PM  Result Value Ref Range   Lactic Acid, Venous 0.5 0.5 - 1.9 mmol/L  Blood gas, venous     Status: Abnormal (Preliminary result)   Collection Time: 09/16/17 11:02 PM  Result Value Ref Range   pH, Ven 7.21 (L) 7.250 - 7.430   pCO2, Ven 95 (HH) 44.0 - 60.0 mmHg   pO2, Ven PENDING 32.0 - 45.0 mmHg   Bicarbonate 38.0 (H) 20.0 - 28.0 mmol/L   Acid-Base Excess 7.4 (H) 0.0 - 2.0 mmol/L   O2 Saturation  PENDING %   Patient temperature 37.0    Sample type VENOUS    ____________________________________________  EKG My review and personal interpretation at Time: 17:21   Indication: falls  Rate: 80  Rhythm: sinus Axis: normal Other: nonsepcific st abn, no stemi ____________________________________________  RADIOLOGY  I personally reviewed all radiographic images ordered to evaluate for the above acute complaints and reviewed radiology reports and findings.  These findings were personally discussed with the patient.  Please see medical record for radiology report.  ____________________________________________   PROCEDURES  Procedure(s) performed:  .Critical Care Performed by: Merlyn Lot, MD Authorized by: Merlyn Lot, MD   Critical care provider statement:    Critical care time (minutes):  30   Critical care time was exclusive of:  Separately billable procedures and treating other patients   Critical care was necessary to treat or prevent imminent or life-threatening deterioration of the following conditions:  Respiratory failure   Critical care was time spent personally by me on the following activities:  Development of treatment plan with patient or surrogate, discussions with consultants, evaluation of patient's response to treatment, examination of patient, obtaining history from patient or surrogate, ordering and performing treatments and interventions, ordering and review of laboratory studies, ordering and review of radiographic studies, pulse oximetry, re-evaluation of patient's condition and review of old charts      Critical Care performed: no ____________________________________________  INITIAL IMPRESSION / ASSESSMENT AND PLAN / ED COURSE  Pertinent labs & imaging results that were available during my care of the patient were reviewed by me and considered in my medical decision making (see chart for details).   DDX: Dehydration, sepsis, pna, uti,  hypoglycemia, cva, ,drug effect, mets, sdh, iph  [name] [name] is a 77 y.o. who presents to the ED with symptoms as described above.  She is currently afebrile He[place] dynamically stable but with very complex presentation given her comorbidities and active disease.  CT imaging will be ordered to evaluate for traumatic injury given her fall.  We will check blood work.  Will repeat urinalysis that she was given fosfomycin.   The patient will be placed on continuous pulse oximetry and telemetry for monitoring.  Laboratory evaluation will be sent to evaluate for the above complaints.     Clinical Course as of Sep 16 2325  Wed Sep 16, 2017  3614 Due to difficulty with obtaining IV access, a 20G peripheral IV catheter was inserted using US guidance into the left IJ.  The site was prepped with chlorhexidine and allowed to dry.  The patient tolerated the procedure without any complications.    [PR]  2156 CT imaging shows no traumatic injury but patient has been having significant falls.  Urinalysis actually improved after dose of fosfomycin.  No clear sign of sepsis.  Possible residual UTI.  Will check orthostasis as well as touch with Dr. Mike Gip.   [PR]  2234 Discussed case with Dr. Mike Gip.  Patient also reporting increased confusion via family members to her earlier today when she was talking.  Does not appear confused at this point but given her history and recent ESBL UTI she is recommending hospitalization for IV antibiotics which I think is appropriate.  Certainly no signs of sepsis.  We will also order MRI to ensure that she is not having any metastatic component to the cns causing these symptoms.   [PR]    Clinical Course User Index [PR] Merlyn Lot, MD   ABG does show evidence of hypercapnia with acidosis.  Discussed case with Dr.Willis.  She is currently mentating appropriately.  Will start on BiPAP.  As part of my medical decision making, I reviewed the following data within the  [facility] notes reviewed and incorporated, Labs reviewed, notes from prior ED visits.   ____________________________________________   FINAL CLINICAL IMPRESSION(S) / ED DIAGNOSES  Final diagnoses:  Frequent falls  Confusion  ESBL (extended spectrum beta-lactamase) producing bacteria infection  Acute cystitis without hematuria  Acute respiratory failure with hypercapnia (HCC)      NEW MEDICATIONS STARTED DURING THIS VISIT:  New Prescriptions   No medications on file     Note:  This document was prepared using Dragon voice recognition software and may include unintentional dictation errors.    Merlyn Lot, MD 09/16/17 4315    Merlyn Lot, MD 09/16/17 [phone]

## 2017-09-16 NOTE — ED Notes (Addendum)
Pt's port accessed. Blood return and flushed. Unable to get enough blood for waste and other blood draws. Tried several different things with no success.

## 2017-09-16 NOTE — Telephone Encounter (Signed)
Discussed with Dr Mike Gip, called grandson to advise to take her back to ER and he reports that she just got up without her walker again and fell. He agrees to take her to the ER

## 2017-09-16 NOTE — ED Triage Notes (Signed)
Patient from home via [facility]. Patient reports she tripped and fell and hit the back of her head. Denies LOC. Patient also reports she was called today and told to return to ED due to positive E-coli in urine.

## 2017-09-17 ENCOUNTER — Ambulatory Visit: Payer: Medicare HMO

## 2017-09-17 ENCOUNTER — Observation Stay: Payer: Medicare HMO

## 2017-09-17 DIAGNOSIS — R4182 Altered mental status, unspecified: Secondary | ICD-10-CM | POA: Diagnosis not present

## 2017-09-17 DIAGNOSIS — Z66 Do not resuscitate: Secondary | ICD-10-CM | POA: Diagnosis present

## 2017-09-17 DIAGNOSIS — B9629 Other Escherichia coli [E. coli] as the cause of diseases classified elsewhere: Secondary | ICD-10-CM | POA: Diagnosis not present

## 2017-09-17 DIAGNOSIS — J449 Chronic obstructive pulmonary disease, unspecified: Secondary | ICD-10-CM | POA: Diagnosis not present

## 2017-09-17 DIAGNOSIS — F17210 Nicotine dependence, cigarettes, uncomplicated: Secondary | ICD-10-CM | POA: Diagnosis not present

## 2017-09-17 DIAGNOSIS — I2510 Atherosclerotic heart disease of native coronary artery without angina pectoris: Secondary | ICD-10-CM | POA: Diagnosis present

## 2017-09-17 DIAGNOSIS — J9621 Acute and chronic respiratory failure with hypoxia: Secondary | ICD-10-CM | POA: Diagnosis present

## 2017-09-17 DIAGNOSIS — G8929 Other chronic pain: Secondary | ICD-10-CM | POA: Diagnosis present

## 2017-09-17 DIAGNOSIS — J90 Pleural effusion, not elsewhere classified: Secondary | ICD-10-CM | POA: Diagnosis present

## 2017-09-17 DIAGNOSIS — J441 Chronic obstructive pulmonary disease with (acute) exacerbation: Secondary | ICD-10-CM | POA: Diagnosis present

## 2017-09-17 DIAGNOSIS — C3432 Malignant neoplasm of lower lobe, left bronchus or lung: Secondary | ICD-10-CM | POA: Diagnosis present

## 2017-09-17 DIAGNOSIS — N3000 Acute cystitis without hematuria: Secondary | ICD-10-CM | POA: Diagnosis present

## 2017-09-17 DIAGNOSIS — N390 Urinary tract infection, site not specified: Secondary | ICD-10-CM | POA: Diagnosis not present

## 2017-09-17 DIAGNOSIS — B9620 Unspecified Escherichia coli [E. coli] as the cause of diseases classified elsewhere: Secondary | ICD-10-CM | POA: Diagnosis present

## 2017-09-17 DIAGNOSIS — D630 Anemia in neoplastic disease: Secondary | ICD-10-CM | POA: Diagnosis present

## 2017-09-17 DIAGNOSIS — R410 Disorientation, unspecified: Secondary | ICD-10-CM | POA: Diagnosis present

## 2017-09-17 DIAGNOSIS — M797 Fibromyalgia: Secondary | ICD-10-CM | POA: Diagnosis present

## 2017-09-17 DIAGNOSIS — I5042 Chronic combined systolic (congestive) and diastolic (congestive) heart failure: Secondary | ICD-10-CM | POA: Diagnosis present

## 2017-09-17 DIAGNOSIS — E782 Mixed hyperlipidemia: Secondary | ICD-10-CM | POA: Diagnosis present

## 2017-09-17 DIAGNOSIS — Z681 Body mass index (BMI) 19 or less, adult: Secondary | ICD-10-CM | POA: Diagnosis not present

## 2017-09-17 DIAGNOSIS — C3490 Malignant neoplasm of unspecified part of unspecified bronchus or lung: Secondary | ICD-10-CM | POA: Diagnosis not present

## 2017-09-17 DIAGNOSIS — D6959 Other secondary thrombocytopenia: Secondary | ICD-10-CM | POA: Diagnosis present

## 2017-09-17 DIAGNOSIS — G9341 Metabolic encephalopathy: Secondary | ICD-10-CM | POA: Diagnosis present

## 2017-09-17 DIAGNOSIS — Z9981 Dependence on supplemental oxygen: Secondary | ICD-10-CM | POA: Diagnosis not present

## 2017-09-17 DIAGNOSIS — I110 Hypertensive heart disease with heart failure: Secondary | ICD-10-CM | POA: Diagnosis present

## 2017-09-17 DIAGNOSIS — Z1612 Extended spectrum beta lactamase (ESBL) resistance: Secondary | ICD-10-CM | POA: Diagnosis present

## 2017-09-17 DIAGNOSIS — E43 Unspecified severe protein-calorie malnutrition: Secondary | ICD-10-CM | POA: Diagnosis present

## 2017-09-17 DIAGNOSIS — J9622 Acute and chronic respiratory failure with hypercapnia: Secondary | ICD-10-CM | POA: Diagnosis present

## 2017-09-17 DIAGNOSIS — E872 Acidosis: Secondary | ICD-10-CM | POA: Diagnosis present

## 2017-09-17 DIAGNOSIS — G629 Polyneuropathy, unspecified: Secondary | ICD-10-CM | POA: Diagnosis present

## 2017-09-17 DIAGNOSIS — E871 Hypo-osmolality and hyponatremia: Secondary | ICD-10-CM | POA: Diagnosis present

## 2017-09-17 DIAGNOSIS — R0689 Other abnormalities of breathing: Secondary | ICD-10-CM | POA: Diagnosis not present

## 2017-09-17 DIAGNOSIS — Z9181 History of falling: Secondary | ICD-10-CM | POA: Diagnosis not present

## 2017-09-17 LAB — CBC
HEMATOCRIT: 30.4 % — AB (ref 35.0–47.0)
Hemoglobin: 10.1 g/dL — ABNORMAL LOW (ref 12.0–16.0)
MCH: 30 pg (ref 26.0–34.0)
MCHC: 33.3 g/dL (ref 32.0–36.0)
MCV: 90 fL (ref 80.0–100.0)
Platelets: 141 10*3/uL — ABNORMAL LOW (ref 150–440)
RBC: 3.38 MIL/uL — ABNORMAL LOW (ref 3.80–5.20)
RDW: 16.6 % — AB (ref 11.5–14.5)
WBC: 3.7 10*3/uL (ref 3.6–11.0)

## 2017-09-17 LAB — BASIC METABOLIC PANEL
ANION GAP: 7 (ref 5–15)
BUN: 8 mg/dL (ref 8–23)
CALCIUM: 8.6 mg/dL — AB (ref 8.9–10.3)
CO2: 32 mmol/L (ref 22–32)
Chloride: 98 mmol/L (ref 98–111)
Creatinine, Ser: 0.8 mg/dL (ref 0.44–1.00)
GFR calc Af Amer: >60 mL/min (ref >60)
GFR calc non Af Amer: >60 mL/min (ref >60)
GLUCOSE: 144 mg/dL — AB (ref 70–99)
Potassium: 4.4 mmol/L (ref 3.5–5.1)
Sodium: 137 mmol/L (ref 135–145)

## 2017-09-17 LAB — GLUCOSE, CAPILLARY: Glucose-Capillary: 88 mg/dL (ref 70–99)

## 2017-09-17 LAB — MRSA PCR SCREENING: MRSA BY PCR: NEGATIVE

## 2017-09-17 MED ORDER — HYDROCODONE-ACETAMINOPHEN 5-325 MG PO TABS
1.0000 | ORAL_TABLET | Freq: Three times a day (TID) | ORAL | Status: DC | PRN
  Administered 2017-09-17 – 2017-09-18 (×3): 1 via ORAL
  Filled 2017-09-17 (×3): qty 1

## 2017-09-17 MED ORDER — ACETAMINOPHEN 650 MG RE SUPP: 650.0000 mg | Freq: Four times a day (QID) | RECTAL | Status: DC | PRN

## 2017-09-17 MED ORDER — PREMIER PROTEIN SHAKE: 11.0000 [oz_av] | Freq: Two times a day (BID) | ORAL | Status: DC

## 2017-09-17 MED ORDER — GABAPENTIN 300 MG PO CAPS
600.0000 mg | ORAL_CAPSULE | Freq: Three times a day (TID) | ORAL | Status: DC
  Administered 2017-09-17 – 2017-09-18 (×4): 600 mg via ORAL
  Filled 2017-09-17 (×4): qty 2

## 2017-09-17 MED ORDER — GADOBENATE DIMEGLUMINE 529 MG/ML IV SOLN
10.0000 mL | Freq: Once | INTRAVENOUS | Status: AC | PRN
  Administered 2017-09-17: 10 mL via INTRAVENOUS

## 2017-09-17 MED ORDER — ONDANSETRON HCL 4 MG/2ML IJ SOLN: 4.0000 mg | Freq: Four times a day (QID) | INTRAMUSCULAR | Status: DC | PRN

## 2017-09-17 MED ORDER — CLONAZEPAM 0.5 MG PO TABS: 1.0000 mg | ORAL_TABLET | Freq: Every day | ORAL | Status: DC | PRN

## 2017-09-17 MED ORDER — ENOXAPARIN SODIUM 40 MG/0.4ML ~~LOC~~ SOLN
40.0000 mg | SUBCUTANEOUS | Status: DC
  Administered 2017-09-17: 20:00:00 40 mg via SUBCUTANEOUS
  Filled 2017-09-17: qty 0.4

## 2017-09-17 MED ORDER — ONDANSETRON HCL 4 MG PO TABS: 4.0000 mg | ORAL_TABLET | Freq: Four times a day (QID) | ORAL | Status: DC | PRN

## 2017-09-17 MED ORDER — FENTANYL 50 MCG/HR TD PT72
50.0000 ug | MEDICATED_PATCH | TRANSDERMAL | Status: DC
  Administered 2017-09-17: 50 ug via TRANSDERMAL
  Filled 2017-09-17: qty 1

## 2017-09-17 MED ORDER — METHYLPREDNISOLONE SODIUM SUCC 125 MG IJ SOLR
60.0000 mg | Freq: Four times a day (QID) | INTRAMUSCULAR | Status: DC
  Administered 2017-09-17 – 2017-09-18 (×7): 60 mg via INTRAVENOUS
  Filled 2017-09-17 (×8): qty 2

## 2017-09-17 MED ORDER — BUDESONIDE 0.5 MG/2ML IN SUSP
0.5000 mg | Freq: Two times a day (BID) | RESPIRATORY_TRACT | Status: DC
  Administered 2017-09-17 – 2017-09-18 (×3): 0.5 mg via RESPIRATORY_TRACT
  Filled 2017-09-17 (×3): qty 2

## 2017-09-17 MED ORDER — LORAZEPAM 0.5 MG PO TABS
0.5000 mg | ORAL_TABLET | Freq: Four times a day (QID) | ORAL | Status: DC | PRN
  Administered 2017-09-17: 0.5 mg via ORAL
  Filled 2017-09-17: qty 1

## 2017-09-17 MED ORDER — ZOLPIDEM TARTRATE 5 MG PO TABS: 5.0000 mg | ORAL_TABLET | Freq: Every evening | ORAL | Status: DC | PRN

## 2017-09-17 MED ORDER — IPRATROPIUM-ALBUTEROL 0.5-2.5 (3) MG/3ML IN SOLN
3.0000 mL | Freq: Four times a day (QID) | RESPIRATORY_TRACT | Status: DC
  Administered 2017-09-17 – 2017-09-18 (×5): 3 mL via RESPIRATORY_TRACT
  Filled 2017-09-17 (×5): qty 3

## 2017-09-17 MED ORDER — ADULT MULTIVITAMIN W/MINERALS CH
1.0000 | ORAL_TABLET | Freq: Every day | ORAL | Status: DC
  Administered 2017-09-18: 11:00:00 1 via ORAL
  Filled 2017-09-17: qty 1

## 2017-09-17 MED ORDER — NICOTINE 21 MG/24HR TD PT24
21.0000 mg | MEDICATED_PATCH | Freq: Every day | TRANSDERMAL | Status: DC
  Administered 2017-09-17 – 2017-09-18 (×2): 21 mg via TRANSDERMAL
  Filled 2017-09-17 (×2): qty 1

## 2017-09-17 MED ORDER — ACETAMINOPHEN 325 MG PO TABS: 650.0000 mg | ORAL_TABLET | Freq: Four times a day (QID) | ORAL | Status: DC | PRN

## 2017-09-17 MED ORDER — IPRATROPIUM-ALBUTEROL 0.5-2.5 (3) MG/3ML IN SOLN: 3.0000 mL | RESPIRATORY_TRACT | Status: DC | PRN

## 2017-09-17 NOTE — ED Notes (Signed)
Pt able to slide from ER stretcher to CCU bed with minimal assist, although labored breathing with bipap during exertion period

## 2017-09-17 NOTE — ED Notes (Signed)
Patient transported to MRI 

## 2017-09-17 NOTE — Progress Notes (Signed)
Initial Nutrition Assessment  DOCUMENTATION CODES:   Severe malnutrition in context of chronic illness, Underweight  INTERVENTION:  Agree with regular diet.  Provide Premier Protein po BID, each supplement provides 160 kcal and 30 grams of protein. Patient prefers chocolate.  Provide Magic cup TID with meals, each supplement provides 290 kcal and 9 grams of protein. Patient prefers chocolate.  Provide daily MVI.  Encouraged intake of small, frequent meals throughout the day (every few hours). Discussed choosing calorie- and protein-dense foods at those meals to maximize calorie and protein intake.  NUTRITION DIAGNOSIS:   Severe Malnutrition related to chronic illness(CHF, COPD, SCC of lung s/p chemo/XRT) as evidenced by severe fat depletion, severe muscle depletion.  GOAL:   Patient will meet greater than or equal to 90% of their needs  MONITOR:   PO intake, Supplement acceptance, Labs, Weight trends, I & O's  REASON FOR ASSESSMENT:   Consult Assessment of nutrition requirement/status  ASSESSMENT:   77 year old female with PMHx of left breast cancer s/p lumpectomy in 2009 and chemo/XRT, HTN, CHF, asthma, COPD on home O2, chronic UTI, RA, neuropathy, stumbling gait, SCC of left lung s/p chemo and XRT who is admitted with acute on chronic respiratory failure in setting of acute exacerbation of COPD, AMS, UTI, left pleural effusion with compression atelectasis.   Met with patient at bedside. She is known to this RD from previous admission. Patient reports her appetite is even worse now. She is only eating 2 small meals per day, but is unable to describe exactly how much she is taking in. She reports she no longer likes chocolate milk. She already had disliked most oral nutrition supplements including Ensure or Boost. She has not yet tried Premier Protein so she is amenable to trying that.  UBW was 119 lbs. RD obtained bed scale weight of 54.1 kg (119.3 lbs) on 07/19/2017. Patient  is now 47 kg (103.62 lbs). She has lost 7.1 kg (13.1% body weight) over the past 2 months, which is significant for time frame.  Medications reviewed and include: fentanyl patch, gabapentin, Solu-Medrol 60 mg Q6hrs IV, meropenem.  Labs reviewed: CBG 88.  In July patient met criteria for moderate chronic malnutrition. She has now progressed to severe chronic malnutrition.  Discussed with RN and on rounds.  NUTRITION - FOCUSED PHYSICAL EXAM:    Most Recent Value  Orbital Region  Severe depletion  Upper Arm Region  Severe depletion  Thoracic and Lumbar Region  Severe depletion  Buccal Region  Severe depletion  Temple Region  Severe depletion  Clavicle Bone Region  Severe depletion  Clavicle and Acromion Bone Region  Severe depletion  Scapular Bone Region  Unable to assess  Dorsal Hand  Severe depletion  Patellar Region  Severe depletion  Anterior Thigh Region  Severe depletion  Posterior Calf Region  Severe depletion  Edema (RD Assessment)  None  Hair  Reviewed  Eyes  Reviewed  Mouth  Unable to assess  Skin  Reviewed [ecchymosis]  Nails  Reviewed     Diet Order:   Diet Order            Diet regular Room service appropriate? Yes; Fluid consistency: Thin  Diet effective now              EDUCATION NEEDS:   Education needs have been addressed  Skin:  Skin Assessment: Skin Integrity Issues: Skin Integrity Issues:: Stage II Stage II: mid coccyx  Last BM:  Unknown/PTA  Height:   Ht  Readings from Last 1 Encounters:  09/17/17 _0  (1.6 m)    Weight:   Wt Readings from Last 1 Encounters:  09/17/17 47 kg    Ideal Body Weight:  52.3 kg  BMI:  Body mass index is 18.35 kg/m.  Estimated Nutritional Needs:   Kcal:  4158-3094 (30-35 kcal/kg)  Protein:  70-80 grams (1.5-1.7 grams/kg)  Fluid:  1.4-1.6 L/day (1 mL/kcal)  Willey Blade, MS, RD, LDN Office: [phone] Pager: [phone] After Hours/Weekend Pager: [phone]

## 2017-09-17 NOTE — ED Notes (Signed)
Pt. Returned to tx. room in stable condition with no acute changes since departure from unit for scans.   RT at bedside for bipap placement.

## 2017-09-17 NOTE — ED Notes (Signed)
Patient is resting comfortably at this time with no signs of distress present. Equal, unlabored rise and fall of chest noted within normal rate. VS stable. Will continue to monitor.

## 2017-09-17 NOTE — ED Notes (Signed)
Pt uncomfortable with mask, and I voiced my understanding and apologies, and the goal would be to wean her off of the machine throughout the day. Pt understands. Still remains weak, but no WOB unless exerting.

## 2017-09-17 NOTE — Care Management (Signed)
Per telephone notes, patient had referral from cancer center to hospice however per note, patient declined their/hospice services when they went out to see patient.

## 2017-09-17 NOTE — Progress Notes (Signed)
Name: [name] [name] MRN: [id number] DOB: 27-Apr-1940     CONSULTATION DATE: 09/16/2017  Subjective & objective: Improved mental status and tolerating nasal cannula off BiPAP  PAST MEDICAL HISTORY :   has a past medical history of Asthma, Breast cancer ([place]) (2009), Cancer ([place]), CHF (congestive heart failure) ([place]), CHF (congestive heart failure) ([place]), Chronic UTI, COPD (chronic obstructive pulmonary disease) ([place]), Dizziness, Fibromyalgia, Hypertension, Neuropathy, Personal history of tobacco use, presenting hazards to health (05/17/2015), Polyp, larynx, RA (rheumatoid arthritis) ([place]), Sinus infection, Stumbling gait, and Supplemental oxygen dependent.  has a past surgical history that includes Eye muscle surgery (Right); Abdominal hysterectomy; Thumb arthroscopy (Left); Cyst excision (Left, 02/27/2015); Breast lumpectomy (Left, 2009); Flexible bronchoscopy (N/A, 07/01/2016); Flexible bronchoscopy (N/A, 02/09/2017); PORTA CATH INSERTION (N/A, 03/04/2017); and Kyphoplasty (N/A, 05/22/2017). Prior to Admission medications   Medication Sig Start Date End Date Taking? Authorizing Provider  albuterol (PROVENTIL HFA;VENTOLIN HFA) 108 (90 Base) MCG/ACT inhaler Inhale 1-2 puffs into the lungs every 6 (six) hours as needed for wheezing or shortness of breath. Patient taking differently: Inhale 2 puffs into the lungs every 6 (six) hours as needed for wheezing or shortness of breath.  02/16/17  Yes [name], MD  AMBULATORY NON FORMULARY MEDICATION Medication Name: nebulizer and supplies DX: J96.11/J44.9 ULA:GTXMI 09/15/17  Yes [name], MD  clonazePAM (KLONOPIN) 1 MG tablet Take 1 mg by mouth daily as needed for anxiety.    Yes [provider]  fentaNYL (DURAGESIC - DOSED MCG/HR) 50 MCG/HR Place 1 patch (50 mcg total) onto the skin every 3 (three) days. 08/24/17  Yes Karen Kitchens, NP  gabapentin (NEURONTIN) 600 MG tablet Take 600 mg by mouth 3 (three) times daily.  05/25/12  Yes  [provider]  HYDROcodone-acetaminophen (NORCO/VICODIN) 5-325 MG tablet Take 1 tablet by mouth 3 (three) times daily as needed. 08/24/17  Yes Karen Kitchens, NP  ondansetron (ZOFRAN) 8 MG tablet Take 1 tablet (8 mg total) by mouth 2 (two) times daily as needed. Start on the third day after chemotherapy. Patient taking differently: Take 8 mg by mouth 2 (two) times daily as needed for nausea or vomiting. Start on the third day after chemotherapy. 03/19/17  Yes [name], Drue Second, MD  SPIRIVA RESPIMAT 2.5 MCG/ACT AERS Inhale 2.5 mcg into the lungs 2 (two) times daily. 09/10/17  Yes [name], MD  traMADol (ULTRAM) 50 MG tablet Take 50 mg by mouth every 6 (six) hours as needed.  09/08/17  Yes [provider]  zolpidem (AMBIEN) 5 MG tablet Take 5 mg by mouth at bedtime as needed for sleep.    Yes [provider]  nitroGLYCERIN (NITROSTAT) 0.4 MG SL tablet Place 0.4 mg under the tongue every 5 (five) minutes as needed for chest pain.     [provider]   Allergies  Allergen Reactions  . Contrast Media [Iodinated Diagnostic Agents] Shortness Of Breath    Acute onset of shortness of breath following CT contrast administration 06/18/17. Sent to the ED. Dr. Kerman Passey documented incident as an allergic reaction. Needs premedications prior to future contrast media injections. Previous questionable contrast allergy, so the patient was given entire or partial premedications without complications. She was not given any premedications on 06/18/17, resulting in shortness of breath and an ED visit.  . Sulfa Antibiotics Other (See Comments)    Unknown  . Tetracycline Hives  . White Petrolatum Other (See Comments)    Blisters  . Amoxicillin-Pot Clavulanate Rash and Other (See Comments)  Blisters in mouth Has patient had a PCN reaction causing immediate rash, facial/tongue/throat swelling, SOB or lightheadedness with hypotension: No Has patient had a PCN reaction  causing severe rash involving mucus membranes or skin necrosis: No Has patient had a PCN reaction that required hospitalization: No Has patient had a PCN reaction occurring within the last 10 years: No If all of the above answers are "NO", then may proceed with Cephalosporin use.   . Tape Rash    FAMILY HISTORY:  family history includes CAD in her sister; Diabetes in her father; Heart attack in her father; Stroke in her father. SOCIAL HISTORY:  reports that she has been smoking cigarettes. She has a 40.00 pack-year smoking history. She has never used smokeless tobacco. She reports that she does not drink alcohol or use drugs.  REVIEW OF SYSTEMS:   Unable to obtain due to critical illness   VITAL SIGNS: Temp:  [94.6 F (34.8 C)-97.8 F (36.6 C)] 97.8 F (36.6 C) (09/05 0500) Pulse Rate:  [70-93] 77 (09/05 0600) Resp:  [10-18] 18 (09/05 0600) BP: (118-158)/(52-95) 134/66 (09/05 0600) SpO2:  [93 %-100 %] 96 % (09/05 0600) Weight:  [46 kg-47 kg] 47 kg (09/05 0205)  Physical Examination:  Awake and oriented with no acute focal motor deficits Tolerating nasal cannula, no distress, able to talk in full sentences, air entry on the left diminished relative to the right no adventitious sounds.  Left Port-A-Cath in place S1 & S2 are audible with no murmur Benign abdominal exam Wasted extremities with no edema   ASSESSMENT / PLAN:  Acute on chronic respiratory failure, improved and tolerating nasal cannula.  Baseline home O2 2 L nasal cannula Squamous cell lung CA status post radiation chemotherapy in 06/2016.  Oncology recommended hospice COPD with acute exacerbation  Altered mental status with acute metabolic encephalopathy and hypercapnia (improved).  Acute intracranial abnormalities on brain MRI  UTI E. coli ESBL -Meropenem  Left pleural effusion with compression atelectasis.  Same as noticed on a chest CT scan done on 06/18/2017 -Consider left thoracentesis to improve lung  compliance if patient suffered respiratory distress  Fibromyalgia and rheumatoid arthritis -Mild analgesia on Duragesic patch + gabapentin and as needed Norco  Hypertension -Optimize antihypertensives and monitor hemodynamics  Anemia -Keep hemoglobin more than 7 g/dL  Thrombocytopenia -Monitor platelet  Echo 11/2015 LVEF 55 to 60% with grade 1 diastolic dysfunction DNR  DVT & GI prophylaxis.  Continue with supportive care  Critical care time 30 minutes

## 2017-09-17 NOTE — Progress Notes (Signed)
Pharmacy Antibiotic Note  [name] [name] is a 77 y.o. female admitted on 09/16/2017 with ESBL Ecoli UTI.  Patient has history of lung cancer and recurrent UTIs. Patient received fosphomycin 3g in on 9/1. Pharmacy has been consulted for meropenem dosing.  Plan: Continue meropenem 1g IV Q12hr.   Height: 5\' 3"  (160 cm) Weight: 103 lb 9.9 oz (47 kg) IBW/kg (Calculated) : 52.4  Temp (24hrs), Avg:96.2 F (35.7 C), Min:94.6 F (34.8 C), Max:97.8 F (36.6 C)  Recent Labs  Lab 09/13/17 1851 09/16/17 1930 09/16/17 1931 09/17/17 0538  WBC 7.2 4.7  --  3.7  CREATININE 1.06* 0.89  --  0.80  LATICACIDVEN  --   --  0.5  --     Estimated Creatinine Clearance: 45.8 mL/min (by C-G formula based on SCr of 0.8 mg/dL).    Allergies  Allergen Reactions  . Contrast Media [Iodinated Diagnostic Agents] Shortness Of Breath    Acute onset of shortness of breath following CT contrast administration 06/18/17. Sent to the ED. Dr. Kerman Passey documented incident as an allergic reaction. Needs premedications prior to future contrast media injections. Previous questionable contrast allergy, so the patient was given entire or partial premedications without complications. She was not given any premedications on 06/18/17, resulting in shortness of breath and an ED visit.  . Sulfa Antibiotics Other (See Comments)    Unknown  . Tetracycline Hives  . White Petrolatum Other (See Comments)    Blisters  . Amoxicillin-Pot Clavulanate Rash and Other (See Comments)    Blisters in mouth Has patient had a PCN reaction causing immediate rash, facial/tongue/throat swelling, SOB or lightheadedness with hypotension: No Has patient had a PCN reaction causing severe rash involving mucus membranes or skin necrosis: No Has patient had a PCN reaction that required hospitalization: No Has patient had a PCN reaction occurring within the last 10 years: No If all of the above answers are "NO", then may proceed with Cephalosporin use.    . Tape Rash    Antimicrobials this admission: Fosphomycin 9/1 x 1  Meropenem 9/4 >>   Dose adjustments this admission: N/A  Microbiology results: 9.4 BCx: no growth < 12 hours  9/1 UCx: >100K ESBL Ecoli  9/5 MRSA PCR: negative   Thank you for allowing pharmacy to be a part of this patient's care.  Marshay Slates L 09/17/2017 12:45 PM

## 2017-09-17 NOTE — Evaluation (Signed)
Physical Therapy Evaluation Patient Details Name: [name] [name] MRN: [id number] DOB: 1940/03/06 Today's Date: 09/17/2017   History of Present Illness  Patient is a 51 year year old female admitted for confusion and acute respiratory failure s/p fall.  PMH includes RA, neuropathy, Htn, fibromyalgia, COPD, CHF, breast CA and asthma.  Clinical Impression  Patient is a 77 year old female who lives in a one story home with her son.  She is independent with mobility at baseline though she states that she has a RW at home which she plans to start using.  Pt is able to perform bed mobility mod I and sit at EOB with good balance.  She presented with generalized weakness of UE and fair to good strength of LE's.  Pt able to stand from bedside with RW and only VC's for good body mechanics.  She ambulated along bedside with VC's for management of RW but was required to sit down due to O2 desat to upper 80% range.  Pt remained asymptomatic throughout evaluation and reported no pain.  She demonstrated unsteadiness on feet and gait deviations indicative of fall risk.  She will continue to benefit from skilled PT with focus on strength, tolerance to activity, balance and fall prevention and proper use of AD.  It is recommended that pt participate in home health PT following discharge.    Follow Up Recommendations Home health PT;Supervision for mobility/OOB    Equipment Recommendations  None recommended by PT    Recommendations for Other Services       Precautions / Restrictions Precautions Precautions: Fall Precaution Comments: Fall history Restrictions Weight Bearing Restrictions: No      Mobility  Bed Mobility Overal bed mobility: Modified Independent             General bed mobility comments: Very slow with movement and uses bed rail but able to get to EOB independently.  Transfers Overall transfer level: Needs assistance Equipment used: Rolling walker (2 wheeled) Transfers: Sit to/from  Stand Sit to Stand: Min guard         General transfer comment: Pt able to stand without assistance to rise.  PT provided min VC's for hand placement and use of RW as pt does not usually use an AD for mobility.    Ambulation/Gait Ambulation/Gait assistance: Min guard Gait Distance (Feet): 15 Feet Assistive device: Rolling walker (2 wheeled)     Gait velocity interpretation: <1.8 ft/sec, indicate of risk for recurrent falls General Gait Details: Moderate foot clearance and decreased step length.  Appeared unsteady on feet at times but was able to manage with RW.  Stairs            Wheelchair Mobility    Modified Rankin (Stroke Patients Only)       Balance Overall balance assessment: Modified Independent                                           Pertinent Vitals/Pain Pain Assessment: No/denies pain    Home Living Family/patient expects to be discharged to:: Private residence Living Arrangements: Children Available Help at Discharge: Family;Available 24 hours/day(Patient lives with her son and grandson and states that one of their girlfriends acts as her caregiver and is with her daily.) Type of Home: House Home Access: Stairs to enter Entrance Stairs-Rails: Can reach both Entrance Stairs-Number of Steps: 2 Home Layout: One level Home  Equipment: Walker - 2 wheels      Prior Function Level of Independence: Independent with assistive device(s)         Comments: Stated that she does not usually use an AD but that she has a RW and will be using it from now on.     Hand Dominance        Extremity/Trunk Assessment   Upper Extremity Assessment Upper Extremity Assessment: Generalized weakness    Lower Extremity Assessment Lower Extremity Assessment: Overall WFL for tasks assessed(Grossly 4-/5 bilaterally.)    Cervical / Trunk Assessment Cervical / Trunk Assessment: Kyphotic  Communication   Communication: No difficulties  Cognition  Arousal/Alertness: Awake/alert Behavior During Therapy: WFL for tasks assessed/performed Overall Cognitive Status: Within Functional Limits for tasks assessed                                 General Comments: A&O to self and situation.  Follows commands consistently.  Impulsive at times and required redirection.        General Comments      Exercises     Assessment/Plan    PT Assessment Patient needs continued PT services  PT Problem List Decreased strength;Decreased mobility;Decreased balance;Decreased knowledge of use of DME;Decreased activity tolerance       PT Treatment Interventions DME instruction;Therapeutic activities;Gait training;Therapeutic exercise;Patient/family education;Stair training;Balance training;Functional mobility training;Neuromuscular re-education    PT Goals (Current goals can be found in the Care Plan section)  Acute Rehab PT Goals Patient Stated Goal: To return home and continue general daily activity with use of RW. PT Goal Formulation: With patient Time For Goal Achievement: 10/01/17 Potential to Achieve Goals: Good    Frequency Min 2X/week   Barriers to discharge        Co-evaluation               AM-PAC PT "6 Clicks" Daily Activity  Outcome Measure Difficulty turning over in bed (including adjusting bedclothes, sheets and blankets)?: A Little Difficulty moving from lying on back to sitting on the side of the bed? : A Little Difficulty sitting down on and standing up from a chair with arms (e.g., wheelchair, bedside commode, etc,.)?: A Little Help needed moving to and from a bed to chair (including a wheelchair)?: A Little Help needed walking in hospital room?: A Little Help needed climbing 3-5 steps with a railing? : A Little 6 Click Score: 18    End of Session Equipment Utilized During Treatment: Gait belt Activity Tolerance: Patient tolerated treatment well;Treatment limited secondary to medical complications  (Comment)(O2 sats remained around 88-90% so ambulatory distance was limited.) Patient left: in bed;with bed alarm set;with nursing/sitter in room;with call bell/phone within reach Nurse Communication: Mobility status PT Visit Diagnosis: Unsteadiness on feet (R26.81);Muscle weakness (generalized) (M62.81)    Time: 1517-6160 PT Time Calculation (min) (ACUTE ONLY): 28 min   Charges:   PT Evaluation $PT Eval Low Complexity: 1 Low          Roxanne Gates, PT, DPT   Roxanne Gates 09/17/2017, 12:45 PM

## 2017-09-17 NOTE — Progress Notes (Signed)
[facility] [place] NAME: [name] [name]    MR#:  [id number]  DATE OF BIRTH:  12-22-1940  SUBJECTIVE: Admitted for ESBL UTI, on meropenem.  Patient had a fall at home was seen in the emergency room 2 days ago for UTI and was given fosfomycin.  Now seen in the [name], denies any complaints.  CHIEF COMPLAINT:   Chief Complaint  Patient presents with  . Fall  . Abnormal Lab    REVIEW OF SYSTEMS:   ROS CONSTITUTIONAL: No fever, fatigue or weakness.  EYES: No blurred or double vision.  EARS, NOSE, AND THROAT: No tinnitus or ear pain.  RESPIRATORY: No cough, shortness of breath, wheezing or hemoptysis.  CARDIOVASCULAR: No chest pain, orthopnea, edema.  GASTROINTESTINAL: No nausea, vomiting, diarrhea or abdominal pain.  GENITOURINARY: No dysuria, hematuria.  ENDOCRINE: No polyuria, nocturia,  HEMATOLOGY: No anemia, easy bruising or bleeding SKIN: No rash or lesion. MUSCULOSKELETAL: No joint pain or arthritis.   NEUROLOGIC: No tingling, numbness, weakness.  PSYCHIATRY: No anxiety or depression.   DRUG ALLERGIES:   Allergies  Allergen Reactions  . Contrast Media [Iodinated Diagnostic Agents] Shortness Of Breath    Acute onset of shortness of breath following CT contrast administration 06/18/17. Sent to the ED. Dr. Kerman Passey documented incident as an allergic reaction. Needs premedications prior to future contrast media injections. Previous questionable contrast allergy, so the patient was given entire or partial premedications without complications. She was not given any premedications on 06/18/17, resulting in shortness of breath and an ED visit.  . Sulfa Antibiotics Other (See Comments)    Unknown  . Tetracycline Hives  . White Petrolatum Other (See Comments)    Blisters  . Amoxicillin-Pot Clavulanate Rash and Other (See Comments)    Blisters in mouth Has patient had a PCN reaction causing immediate rash, facial/tongue/throat  swelling, SOB or lightheadedness with hypotension: No Has patient had a PCN reaction causing severe rash involving mucus membranes or skin necrosis: No Has patient had a PCN reaction that required hospitalization: No Has patient had a PCN reaction occurring within the last 10 years: No If all of the above answers are "NO", then may proceed with Cephalosporin use.   . Tape Rash    VITALS:  Blood pressure 134/66, pulse 77, temperature 97.8 F (36.6 C), temperature source Oral, resp. rate 18, height 5\' 3"  (1.6 m), weight 47 kg, SpO2 96 %.  PHYSICAL EXAMINATION:  GENERAL:  77 y.o.-year-old patient lying in the bed with no acute distress.  EYES: Pupils equal, round, reactive to light and accommodation. No scleral icterus. Extraocular muscles intact.  HEENT: Head atraumatic, normocephalic. Oropharynx and nasopharynx clear.  NECK:  Supple, no jugular venous distention. No thyroid enlargement, no tenderness.  LUNGS: Normal breath sounds bilaterally, no wheezing, rales,rhonchi or crepitation. No use of accessory muscles of respiration.  CARDIOVASCULAR: S1, S2 normal. No murmurs, rubs, or gallops.  ABDOMEN: Soft, nontender, nondistended. Bowel sounds present. No organomegaly or mass.  EXTREMITIES: No pedal edema, cyanosis, or clubbing.  NEUROLOGIC: Cranial nerves II through XII are intact. Muscle strength 5/5 in all extremities. Sensation intact. Gait not checked.  PSYCHIATRIC: The patient is alert and oriented x 3.  SKIN: No obvious rash, lesion, or ulcer.    LABORATORY PANEL:   CBC Recent Labs  Lab 09/17/17 0538  WBC 3.7  HGB 10.1*  HCT 30.4*  PLT 141*   ------------------------------------------------------------------------------------------------------------------  Chemistries  Recent Labs  Lab 09/16/17 1930 09/17/17 7209  NA 136 137  K 4.2 4.4  CL 97* 98  CO2 33* 32  GLUCOSE 118* 144*  BUN 10 8  CREATININE 0.89 0.80  CALCIUM 8.7* 8.6*  AST 17  --   ALT 10  --    ALKPHOS 74  --   BILITOT 0.5  --    ------------------------------------------------------------------------------------------------------------------  Cardiac Enzymes No results for input(s): TROPONINI in the last 168 hours. ------------------------------------------------------------------------------------------------------------------  RADIOLOGY:  Ct Head Wo Contrast  Result Date: 09/16/2017 CLINICAL DATA:  Tripped and fell EXAM: CT HEAD WITHOUT CONTRAST CT CERVICAL SPINE WITHOUT CONTRAST TECHNIQUE: Multidetector CT imaging of the head and cervical spine was performed following the standard protocol without intravenous contrast. Multiplanar CT image reconstructions of the cervical spine were also generated. COMPARISON:  CT 09/13/2017, 07/17/2017 FINDINGS: CT HEAD FINDINGS Brain: No acute territorial infarction, hemorrhage or mass is visualized. Enlarged left greater than right extra-axial CSF spaces anteriorly which may reflect atrophy or chronic subdural fluid collections, no change. Mild small vessel ischemic changes of the white matter. Stable ventricle size Vascular: No hyperdense vessels.  Carotid vascular calcification Skull: No fracture Sinuses/Orbits: Postsurgical changes of the right ethmoid and maxillary sinuses with small fluid level in the right maxillary sinus Other: None CT CERVICAL SPINE FINDINGS Alignment: No subluxation.  Facet alignment within normal limits Skull base and vertebrae: No acute fracture. No primary bone lesion or focal pathologic process. Soft tissues and spinal canal: No prevertebral fluid or swelling. No visible canal hematoma. Disc levels: Mild degenerative changes at C5-C6. Facet degenerative changes at multiple levels. Upper chest: Partially visualized left apical pleural effusion. Apical emphysema Other: None IMPRESSION: 1. No CT evidence for acute intracranial abnormality. Atrophy and small vessel ischemic changes of the white matter. Stable enlarged  extra-axial CSF spaces. 2. No acute osseous abnormality of the cervical spine 3. Partially visualized left apical pleural effusion Electronically Signed   By: Donavan Foil M.D.   On: 09/16/2017 18:58   Ct Cervical Spine Wo Contrast  Result Date: 09/16/2017 CLINICAL DATA:  Tripped and fell EXAM: CT HEAD WITHOUT CONTRAST CT CERVICAL SPINE WITHOUT CONTRAST TECHNIQUE: Multidetector CT imaging of the head and cervical spine was performed following the standard protocol without intravenous contrast. Multiplanar CT image reconstructions of the cervical spine were also generated. COMPARISON:  CT 09/13/2017, 07/17/2017 FINDINGS: CT HEAD FINDINGS Brain: No acute territorial infarction, hemorrhage or mass is visualized. Enlarged left greater than right extra-axial CSF spaces anteriorly which may reflect atrophy or chronic subdural fluid collections, no change. Mild small vessel ischemic changes of the white matter. Stable ventricle size Vascular: No hyperdense vessels.  Carotid vascular calcification Skull: No fracture Sinuses/Orbits: Postsurgical changes of the right ethmoid and maxillary sinuses with small fluid level in the right maxillary sinus Other: None CT CERVICAL SPINE FINDINGS Alignment: No subluxation.  Facet alignment within normal limits Skull base and vertebrae: No acute fracture. No primary bone lesion or focal pathologic process. Soft tissues and spinal canal: No prevertebral fluid or swelling. No visible canal hematoma. Disc levels: Mild degenerative changes at C5-C6. Facet degenerative changes at multiple levels. Upper chest: Partially visualized left apical pleural effusion. Apical emphysema Other: None IMPRESSION: 1. No CT evidence for acute intracranial abnormality. Atrophy and small vessel ischemic changes of the white matter. Stable enlarged extra-axial CSF spaces. 2. No acute osseous abnormality of the cervical spine 3. Partially visualized left apical pleural effusion Electronically Signed   By:  Donavan Foil M.D.   On: 09/16/2017 18:58   Mr  Brain W And Wo Contrast  Result Date: 09/17/2017 CLINICAL DATA:  Initial evaluation for acute altered mental status, increasing confusion, frequent falls. History of lung cancer. EXAM: MRI HEAD WITHOUT AND WITH CONTRAST TECHNIQUE: Multiplanar, multiecho pulse sequences of the brain and surrounding structures were obtained without and with intravenous contrast. CONTRAST:  32mL MULTIHANCE GADOBENATE DIMEGLUMINE 529 MG/ML IV SOLN COMPARISON:  Prior CT from 09/16/2017 as well as previous MRI from 07/07/2016. FINDINGS: Brain: Generalized age-related cerebral atrophy. Patchy T2/FLAIR hyperintensity within the periventricular and deep white matter both cerebral hemispheres most consistent with chronic small vessel ischemic disease, mild in nature. No abnormal foci of restricted diffusion to suggest acute or subacute ischemia. Gray-white matter differentiation maintained. No evidence for acute or chronic intracranial hemorrhage. Small remote right cerebellar infarct noted. No other areas of remote cortical infarction. No mass lesion, midline shift or mass effect. No hydrocephalus. No extra-axial fluid collection. Two cherry gland within normal limits. No appreciable abnormal enhancement, although postcontrast sequences are degraded by motion. Vascular: Major intracranial vascular flow voids are maintained. Skull and upper cervical spine: Craniocervical junction normal. Upper cervical spine within normal limits. Bone marrow signal intensity within normal limits. No focal marrow replacing lesion. No scalp soft tissue abnormality. Sinuses/Orbits: Globes and orbital soft tissues within normal limits. Mild mucosal thickening within the right maxillary sinus, left sphenoid sinus, ethmoidal air cells. Anterior right ethmoidal retention cyst noted. Trace right mastoid effusion, of doubtful significance. Inner ear structures grossly normal. Other: None. IMPRESSION: 1. No acute  intracranial abnormality. 2. Mild age-related cerebral atrophy with chronic small vessel ischemic disease. Small remote right cerebellar infarct. 3. No MRI evidence for intracranial metastatic disease. Electronically Signed   By: Jeannine Boga M.D.   On: 09/17/2017 01:28   Dg Chest Portable 1 View  Result Date: 09/16/2017 CLINICAL DATA:  Fall EXAM: PORTABLE CHEST 1 VIEW COMPARISON:  09/13/2017 FINDINGS: Upper normal heart size. Left pleural effusion and basilar pulmonary opacity. Diffuse interstitial prominence. Right jugular Port-A-Cath tip at the cavoatrial junction. Two level midthoracic vertebral augmentation is stable. IMPRESSION: Stable left pleural effusion and dense basilar pulmonary opacity. Electronically Signed   By: Marybelle Killings M.D.   On: 09/16/2017 19:07    EKG:   Orders placed or performed during the hospital encounter of 09/16/17  . EKG 12-Lead  . EKG 12-Lead    ASSESSMENT AND PLAN:   77 year old female patient with history of breast cancer, attention, rheumatoid arthritis, chronic liters, COPD, squamous cell carcinoma of the lung status post chemo and radiation not getting any further treatment had a fall at home, now patient found to have ESBL UTI.  Patient has previous history of UTIs before and has been seeing Dr. Ola Spurr.  And also had shortness of breath which is more than usual. 1.  Acute on chronic respiratory failure with hypoxia, hypercapnia secondary to squamous cell lung cancer with left pleural effusion: Use PRN BiPAP, use bronchodilators, oncology is consulted. 2.  ESBL UTI, continue meropenem, obtain ID consult.  Patient had history of chronic UTIs. 3.  Rheumatoid arthritis 4.  Chronic systolic heart failure; 5. fall, confusion: MRI of the brain negative for metastasis.   All the records are reviewed and case discussed with Care Management/Social Workerr. Management plans discussed with the patient, family and they are in agreement.  CODE  STATUS:DNR  TOTAL TIME TAKING CARE OF THIS PATIENT: 40 minutes.   POSSIBLE D/C IN 1-2 DAYS, DEPENDING ON CLINICAL CONDITION.   Epifanio Lesches M.D on 09/17/2017 at  12:29 PM  Between 7am to 6pm - Pager - [phone]  After 6pm go to [email or web address] - password [email or web address]  Office  [phone]  CC: Primary care physician; Tracie Harrier, MD   Note: This dictation was prepared with Dragon dictation along with smaller phrase technology. Any transcriptional errors that result from this process are unintentional.

## 2017-09-17 NOTE — Progress Notes (Signed)
Patient transferred from CCU to room 123. Patient alert oriented but confused at time. No c/o pain at this time. Patient on 5 liters O2 [place] chronic. Patient in bed locked at lower level, telephone, bedside table and call light within patient reach. Will continue to monitor patient. Report received from [place], [place]

## 2017-09-18 DIAGNOSIS — C3490 Malignant neoplasm of unspecified part of unspecified bronchus or lung: Secondary | ICD-10-CM

## 2017-09-18 DIAGNOSIS — E43 Unspecified severe protein-calorie malnutrition: Secondary | ICD-10-CM

## 2017-09-18 DIAGNOSIS — Z1612 Extended spectrum beta lactamase (ESBL) resistance: Secondary | ICD-10-CM

## 2017-09-18 DIAGNOSIS — Z9181 History of falling: Secondary | ICD-10-CM

## 2017-09-18 DIAGNOSIS — N390 Urinary tract infection, site not specified: Secondary | ICD-10-CM

## 2017-09-18 DIAGNOSIS — F17210 Nicotine dependence, cigarettes, uncomplicated: Secondary | ICD-10-CM

## 2017-09-18 DIAGNOSIS — B9629 Other Escherichia coli [E. coli] as the cause of diseases classified elsewhere: Secondary | ICD-10-CM

## 2017-09-18 DIAGNOSIS — R0689 Other abnormalities of breathing: Secondary | ICD-10-CM

## 2017-09-18 DIAGNOSIS — J449 Chronic obstructive pulmonary disease, unspecified: Secondary | ICD-10-CM

## 2017-09-18 DIAGNOSIS — G9341 Metabolic encephalopathy: Secondary | ICD-10-CM

## 2017-09-18 MED ORDER — HYDROCODONE-ACETAMINOPHEN 5-325 MG PO TABS: 1.0000 | ORAL_TABLET | Freq: Three times a day (TID) | ORAL | 0 refills | Status: DC | PRN

## 2017-09-18 MED ORDER — IPRATROPIUM-ALBUTEROL 0.5-2.5 (3) MG/3ML IN SOLN
3.0000 mL | Freq: Three times a day (TID) | RESPIRATORY_TRACT | Status: DC
  Administered 2017-09-18: 3 mL via RESPIRATORY_TRACT
  Filled 2017-09-18: qty 3

## 2017-09-18 MED ORDER — NITROFURANTOIN MACROCRYSTAL 100 MG PO CAPS: 100.0000 mg | ORAL_CAPSULE | Freq: Four times a day (QID) | ORAL | 0 refills | Status: AC

## 2017-09-18 MED ORDER — HEPARIN SOD (PORK) LOCK FLUSH 100 UNIT/ML IV SOLN
500.0000 [IU] | Freq: Once | INTRAVENOUS | Status: AC
  Administered 2017-09-18: 500 [IU] via INTRAVENOUS
  Filled 2017-09-18: qty 5

## 2017-09-18 NOTE — Plan of Care (Signed)

## 2017-09-18 NOTE — Plan of Care (Signed)
Pt was d/ced home to be followed by Hospice.  Reviewed d/c paperwork and f/u appts.  Obtained new script for hydrocodone.  De-accesssed port and heparin locked it.  Pt appears to be having some confusion.  Pt's son picked her up and took her home.  DNR signed and given to pt.

## 2017-09-18 NOTE — Progress Notes (Signed)
Flutter education complete, pt understands technique and reason for use. Pt independent with use

## 2017-09-18 NOTE — Consult Note (Signed)
[facility]  Date of admission:  09/16/2017  Inpatient day:  09/18/2017  Consulting physician: Dr. Lance Coon   Reason for Consultation:  Falls, UTI, Lung cancer   Chief Complaint: [name] [name] is a 77 y.o. female with squamous cell lung cancer who was admitted with weakness s/p falls, an E coli UTI, and metabolic encephalopathy secondary to hypercapnea.  HPI:  The patient was seen in the medical oncology clinic on 08/06/2017.  At that time, she had ongoing decline in respiratory status and performance status.  She was not interested in interventions.  She requested Hospice services.  Her son later turned them away.  The patient was last seen in the medical oncology clinic 08/24/2017.  At that time, she had a continued decline in overall health.  She was on 4 L oxygen via nasal cannula.  She spent the majority of her day resting.  She had acute worsening of her chronic pain previously she had not followed up with orthopedic surgery.  Weight was down 12 pounds.  She refused appetite stimulants.  Code status was discussed and confirmed DNR/DNI.  She was not interested in Hospice but wished to consider future chemotherapy if her performance status improved.  She was scheduled for follow-up.  [name] called the clinic on 09/16/2017 stating that she was confused.  She had recently been diagnosed with a UTI and was prescribed fosfomycin.  She apparently slipped at home.  She was referred to the [facility] ER.  In the ER, she was noted to have a persistent UTI (> 100,000 colonies/ml ESBL E coli).  VBG revealed a pH of 7.21 with pCO2 95.  She was treated with IV Solumedrol, duonebs, and BiPAP for COPD flare.  Head CT on 09/16/2017 revealed no acute abnormality.  Head MRI on 09/17/2017 revealed no acute intracerebral abnormality.  There was mild cerebral atrophy and a small right cerebellar infarct.  She has been on Meropenem for her UTI.  She was transferred out of the [name] to  medical oncology yesterday.  Symptomatically, she is feeling back to herself.  She does not feel like she was confused at home.  She is ready to go home.   Past Medical History:  Diagnosis Date  . Asthma   . Breast cancer ([place]) 2009   left  . Cancer ([place])   . CHF (congestive heart failure) ([place])   . CHF (congestive heart failure) ([place])   . Chronic UTI   . COPD (chronic obstructive pulmonary disease) ([place])   . Dizziness   . Fibromyalgia   . Hypertension   . Neuropathy   . Personal history of tobacco use, presenting hazards to health 05/17/2015  . Polyp, larynx   . RA (rheumatoid arthritis) ([place])   . Sinus infection    recent  . Stumbling gait    to the left  . Supplemental oxygen dependent    2.5l    Past Surgical History:  Procedure Laterality Date  . ABDOMINAL HYSTERECTOMY    . BREAST LUMPECTOMY Left 2009   chemo and radiation  . CYST EXCISION Left 02/27/2015   Procedure: CYST REMOVAL;  Surgeon: Hessie Knows, MD;  Location: [facility] ORS;  Service: Orthopedics;  Laterality: Left;  . EYE MUSCLE SURGERY Right    13 surgeries  . FLEXIBLE BRONCHOSCOPY N/A 07/01/2016   Procedure: FLEXIBLE BRONCHOSCOPY;  Surgeon: Wilhelmina Mcardle, MD;  Location: [facility] ORS;  Service: Pulmonary;  Laterality: N/A;  . FLEXIBLE BRONCHOSCOPY N/A 02/09/2017   Procedure: FLEXIBLE  BRONCHOSCOPY;  Surgeon: [name], MD;  Location: [facility] ORS;  Service: Pulmonary;  Laterality: N/A;  . KYPHOPLASTY N/A 05/22/2017   Procedure: Celine Ahr;  Surgeon: Hessie Knows, MD;  Location: [facility] ORS;  Service: Orthopedics;  Laterality: N/A;  . PORTA CATH INSERTION N/A 03/04/2017   Procedure: PORTA CATH INSERTION;  Surgeon: [name], MD;  Location: [facility];  Service: Cardiovascular;  Laterality: N/A;  . THUMB ARTHROSCOPY Left     Family History  Problem Relation Age of Onset  . Diabetes Father   . Stroke Father   . Heart attack Father   . CAD Sister     Social History:  reports that she  has been smoking cigarettes. She has a 40.00 pack-year smoking history. She has never used smokeless tobacco. She reports that she does not drink alcohol or use drugs.  The patient is alone today.  Allergies:  Allergies  Allergen Reactions  . Contrast Media [Iodinated Diagnostic Agents] Shortness Of Breath    Acute onset of shortness of breath following CT contrast administration 06/18/17. Sent to the ED. Dr. Kerman Passey documented incident as an allergic reaction. Needs premedications prior to future contrast media injections. Previous questionable contrast allergy, so the patient was given entire or partial premedications without complications. She was not given any premedications on 06/18/17, resulting in shortness of breath and an ED visit.  . Sulfa Antibiotics Other (See Comments)    Unknown  . Tetracycline Hives  . White Petrolatum Other (See Comments)    Blisters  . Amoxicillin-Pot Clavulanate Rash and Other (See Comments)    Blisters in mouth Has patient had a PCN reaction causing immediate rash, facial/tongue/throat swelling, SOB or lightheadedness with hypotension: No Has patient had a PCN reaction causing severe rash involving mucus membranes or skin necrosis: No Has patient had a PCN reaction that required hospitalization: No Has patient had a PCN reaction occurring within the last 10 years: No If all of the above answers are "NO", then may proceed with Cephalosporin use.   . Tape Rash    Medications Prior to Admission  Medication Sig Dispense Refill  . albuterol (PROVENTIL HFA;VENTOLIN HFA) 108 (90 Base) MCG/ACT inhaler Inhale 1-2 puffs into the lungs every 6 (six) hours as needed for wheezing or shortness of breath. (Patient taking differently: Inhale 2 puffs into the lungs every 6 (six) hours as needed for wheezing or shortness of breath. ) 1 Inhaler 2  . AMBULATORY NON FORMULARY MEDICATION Medication Name: nebulizer and supplies DX: J96.11/J44.9 [id number] 1 each 0  .  clonazePAM (KLONOPIN) 1 MG tablet Take 1 mg by mouth daily as needed for anxiety.     . fentaNYL (DURAGESIC - DOSED MCG/HR) 50 MCG/HR Place 1 patch (50 mcg total) onto the skin every 3 (three) days. 5 patch 0  . gabapentin (NEURONTIN) 600 MG tablet Take 600 mg by mouth 3 (three) times daily.     [name] HYDROcodone-acetaminophen (NORCO/VICODIN) 5-325 MG tablet Take 1 tablet by mouth 3 (three) times daily as needed. 30 tablet 0  . ondansetron (ZOFRAN) 8 MG tablet Take 1 tablet (8 mg total) by mouth 2 (two) times daily as needed. Start on the third day after chemotherapy. (Patient taking differently: Take 8 mg by mouth 2 (two) times daily as needed for nausea or vomiting. Start on the third day after chemotherapy.) 30 tablet 1  . SPIRIVA RESPIMAT 2.5 MCG/ACT AERS Inhale 2.5 mcg into the lungs 2 (two) times daily. 4 g 0  .  traMADol (ULTRAM) 50 MG tablet Take 50 mg by mouth every 6 (six) hours as needed.     . zolpidem (AMBIEN) 5 MG tablet Take 5 mg by mouth at bedtime as needed for sleep.     . nitroGLYCERIN (NITROSTAT) 0.4 MG SL tablet Place 0.4 mg under the tongue every 5 (five) minutes as needed for chest pain.       Review of Systems: GENERAL:  Fatigue.  Rests most of day at home.  No fevers, sweats.  Weight loss. PERFORMANCE STATUS (ECOG):  3 HEENT:  No visual changes, runny nose, sore throat, mouth sores or tenderness. Lungs:  Chronic shortness of breath.  Chronic cough.  No hemoptysis. On oxygen. Cardiac:  No chest pain, palpitations, orthopnea, or PND. GI:  Appetite modest.  No nausea, vomiting, diarrhea, constipation, melena or hematochezia. GU:  No urgency, frequency, dysuria, or hematuria.  Prior documented ESBL colonization. Musculoskeletal:  Chronic back pain.  Joint pain.  No muscle tenderness. Extremities:  No pain or swelling. Skin:  No rashes or skin changes. Neuro:  General weakness.  No headache, numbness or weakness, balance or coordination issues. Endocrine:  No diabetes, thyroid  issues, hot flashes or night sweats. Psych:  No mood changes, depression or anxiety. Pain:  No focal pain. Review of systems:  All other systems reviewed and found to be negative.  Physical Exam:  Blood pressure (!) 149/85, pulse 81, temperature 97.6 F (36.4 C), temperature source Oral, resp. rate (!) 25, height 5' 3" (1.6 m), weight 103 lb 9.9 oz (47 kg), SpO2 99 %.  GENERAL:  Elderly woman sitting comfortably on the medical unit in no acute distress. MENTAL STATUS:  Alert and oriented to person, place and time. HEAD:  Short white hair.  Normocephalic, atraumatic, face symmetric, no Cushingoid features. EYES:  Blue eyes.  Pupils equal round and reactive to light and accomodation.  No conjunctivitis or scleral icterus. ENT:   in place.  Oropharynx clear without lesion.  Tongue normal. Mucous membranes dry.  RESPIRATORY:  Decreased breath sounds left base.  Right sided wheezing.  No rales or rhonchi. CARDIOVASCULAR:  Regular rate and rhythm without murmur, rub or gallop. ABDOMEN:  Soft, non-tender, with active bowel sounds, and no hepatosplenomegaly.  No masses. SKIN:  Significant bruising on arms.  No rashes, ulcers or lesions. EXTREMITIES: No edema, no skin discoloration or tenderness.  No palpable cords. LYMPH NODES: No palpable cervical, supraclavicular, axillary or inguinal adenopathy  NEUROLOGICAL: Unremarkable. PSYCH:  Appropriate.   Results for orders placed or performed during the hospital encounter of 09/16/17 (from the past 48 hour(s))  CBC with Differential/Platelet     Status: Abnormal   Collection Time: 09/16/17  7:30 PM  Result Value Ref Range   WBC 4.7 3.6 - 11.0 K/uL   RBC 3.57 (L) 3.80 - 5.20 MIL/uL   Hemoglobin 10.8 (L) 12.0 - 16.0 g/dL   HCT 32.1 (L) 35.0 - 47.0 %   MCV 90.0 80.0 - 100.0 fL   MCH 30.2 26.0 - 34.0 pg   MCHC 33.5 32.0 - 36.0 g/dL   RDW 17.0 (H) 11.5 - 14.5 %   Platelets 156 150 - 440 K/uL   Neutrophils Relative % 60 %   Neutro Abs 2.8 1.4 -  6.5 K/uL   Lymphocytes Relative 22 %   Lymphs Abs 1.0 1.0 - 3.6 K/uL   Monocytes Relative 11 %   Monocytes Absolute 0.5 0.2 - 0.9 K/uL   Eosinophils Relative 6 %  Eosinophils Absolute 0.3 0 - 0.7 K/uL   Basophils Relative 1 %   Basophils Absolute 0.0 0 - 0.1 K/uL    Comment: Performed at [facility], [address] [place], [place] [address]  Comprehensive metabolic panel     Status: Abnormal   Collection Time: 09/16/17  7:30 PM  Result Value Ref Range   Sodium 136 135 - 145 mmol/L   Potassium 4.2 3.5 - 5.1 mmol/L   Chloride 97 (L) 98 - 111 mmol/L   CO2 33 (H) 22 - 32 mmol/L   Glucose, Bld 118 (H) 70 - 99 mg/dL   BUN 10 8 - 23 mg/dL   Creatinine, Ser 0.89 0.44 - 1.00 mg/dL   Calcium 8.7 (L) 8.9 - 10.3 mg/dL   Total Protein 6.2 (L) 6.5 - 8.1 g/dL   Albumin 3.1 (L) 3.5 - 5.0 g/dL   AST 17 15 - 41 U/L   ALT 10 0 - 44 U/L   Alkaline Phosphatase 74 38 - 126 U/L   Total Bilirubin 0.5 0.3 - 1.2 mg/dL   GFR calc non Af Amer >60 >60 mL/min   GFR calc Af Amer >60 >60 mL/min    Comment: (NOTE) The eGFR has been calculated using the CKD EPI equation. This calculation has not been validated in all clinical situations. eGFR's persistently <60 mL/min signify possible Chronic Kidney Disease.    Anion gap 6 5 - 15    Comment: Performed at [facility], [address] [place], [place] [address]  Urinalysis, Complete w Microscopic     Status: Abnormal   Collection Time: 09/16/17  7:30 PM  Result Value Ref Range   Color, Urine YELLOW (A) YELLOW   APPearance CLEAR (A) CLEAR   Specific Gravity, Urine 1.009 1.005 - 1.030   pH 5.0 5.0 - 8.0   Glucose, UA NEGATIVE NEGATIVE mg/dL   Hgb urine dipstick NEGATIVE NEGATIVE   Bilirubin Urine NEGATIVE NEGATIVE   Ketones, ur NEGATIVE NEGATIVE mg/dL   Protein, ur NEGATIVE NEGATIVE mg/dL   Nitrite NEGATIVE NEGATIVE   Leukocytes, UA MODERATE (A) NEGATIVE   RBC / HPF 0-5 0 - 5 RBC/hpf   WBC, UA 21-50 0 - 5 WBC/hpf   Bacteria,  UA RARE (A) NONE SEEN   Squamous Epithelial / LPF 0-5 0 - 5   Mucus PRESENT    Non Squamous Epithelial PRESENT (A) NONE SEEN    Comment: Performed at [facility], [address] [place], [place] [address]  Lactic acid, plasma     Status: None   Collection Time: 09/16/17  7:31 PM  Result Value Ref Range   Lactic Acid, Venous 0.5 0.5 - 1.9 mmol/L    Comment: Performed at [facility], [address] [place], [place] [address]  Blood Culture (routine x 2)     Status: None (Preliminary result)   Collection Time: 09/16/17  7:31 PM  Result Value Ref Range   Specimen Description BLOOD RIGHT EJ    Special Requests      BOTTLES DRAWN AEROBIC AND ANAEROBIC Blood Culture adequate volume   Culture      NO GROWTH < 12 HOURS Performed at [facility], [address] [place],  [address]    Report Status PENDING   Blood Culture (routine x 2)     Status: None (Preliminary result)   Collection Time: 09/16/17  7:32 PM  Result Value Ref Range   Specimen Description BLOOD LEFT ARM  Special Requests      BOTTLES DRAWN AEROBIC AND ANAEROBIC Blood Culture adequate volume   Culture      NO GROWTH < 12 HOURS Performed at [facility], [address] [place], [place] [address]    Report Status PENDING   Blood gas, venous     Status: Abnormal (Preliminary result)   Collection Time: 09/16/17 11:02 PM  Result Value Ref Range   pH, Ven 7.21 (L) 7.250 - 7.430   pCO2, Ven 95 (HH) 44.0 - 60.0 mmHg    Comment: [facility], RN AT 2313 ON [id number] CHW, RRT    pO2, Ven PENDING 32.0 - 45.0 mmHg   Bicarbonate 38.0 (H) 20.0 - 28.0 mmol/L   Acid-Base Excess 7.4 (H) 0.0 - 2.0 mmol/L   O2 Saturation PENDING %   Patient temperature 37.0    Sample type VENOUS     Comment: Performed at [facility], [address] [place], [place] [address]  Glucose, capillary     Status: None   Collection Time: 09/17/17  2:33 AM  Result Value Ref Range    Glucose-Capillary 88 70 - 99 mg/dL  MRSA PCR Screening     Status: None   Collection Time: 09/17/17  2:55 AM  Result Value Ref Range   MRSA by PCR NEGATIVE NEGATIVE    Comment:        The GeneXpert MRSA Assay (FDA approved for NASAL specimens only), is one component of a comprehensive MRSA colonization surveillance program. It is not intended to diagnose MRSA infection nor to guide or monitor treatment for MRSA infections. Performed at [facility], [address] [place], [place] [address]   Basic metabolic panel     Status: Abnormal   Collection Time: 09/17/17  5:38 AM  Result Value Ref Range   Sodium 137 135 - 145 mmol/L   Potassium 4.4 3.5 - 5.1 mmol/L   Chloride 98 98 - 111 mmol/L   CO2 32 22 - 32 mmol/L   Glucose, Bld 144 (H) 70 - 99 mg/dL   BUN 8 8 - 23 mg/dL   Creatinine, Ser 0.80 0.44 - 1.00 mg/dL   Calcium 8.6 (L) 8.9 - 10.3 mg/dL   GFR calc non Af Amer >60 >60 mL/min   GFR calc Af Amer >60 >60 mL/min    Comment: (NOTE) The eGFR has been calculated using the CKD EPI equation. This calculation has not been validated in all clinical situations. eGFR's persistently <60 mL/min signify possible Chronic Kidney Disease.    Anion gap 7 5 - 15    Comment: Performed at [facility], [address] [place], [place] [address]  CBC     Status: Abnormal   Collection Time: 09/17/17  5:38 AM  Result Value Ref Range   WBC 3.7 3.6 - 11.0 K/uL   RBC 3.38 (L) 3.80 - 5.20 MIL/uL   Hemoglobin 10.1 (L) 12.0 - 16.0 g/dL   HCT 30.4 (L) 35.0 - 47.0 %   MCV 90.0 80.0 - 100.0 fL   MCH 30.0 26.0 - 34.0 pg   MCHC 33.3 32.0 - 36.0 g/dL   RDW 16.6 (H) 11.5 - 14.5 %   Platelets 141 (L) 150 - 440 K/uL    Comment: Performed at [facility], [address] [place], [place] [address]   Ct Head Wo Contrast  Result Date: 09/16/2017 CLINICAL DATA:  Tripped and fell EXAM: CT HEAD WITHOUT CONTRAST CT CERVICAL SPINE WITHOUT CONTRAST TECHNIQUE: Multidetector  CT  imaging of the head and cervical spine was performed following the standard protocol without intravenous contrast. Multiplanar CT image reconstructions of the cervical spine were also generated. COMPARISON:  CT 09/13/2017, 07/17/2017 FINDINGS: CT HEAD FINDINGS Brain: No acute territorial infarction, hemorrhage or mass is visualized. Enlarged left greater than right extra-axial CSF spaces anteriorly which may reflect atrophy or chronic subdural fluid collections, no change. Mild small vessel ischemic changes of the white matter. Stable ventricle size Vascular: No hyperdense vessels.  Carotid vascular calcification Skull: No fracture Sinuses/Orbits: Postsurgical changes of the right ethmoid and maxillary sinuses with small fluid level in the right maxillary sinus Other: None CT CERVICAL SPINE FINDINGS Alignment: No subluxation.  Facet alignment within normal limits Skull base and vertebrae: No acute fracture. No primary bone lesion or focal pathologic process. Soft tissues and spinal [name]: No prevertebral fluid or swelling. No visible [name] hematoma. Disc levels: Mild degenerative changes at C5-C6. Facet degenerative changes at multiple levels. Upper chest: Partially visualized left apical pleural effusion. Apical emphysema Other: None IMPRESSION: 1. No CT evidence for acute intracranial abnormality. Atrophy and small vessel ischemic changes of the white matter. Stable enlarged extra-axial CSF spaces. 2. No acute osseous abnormality of the cervical spine 3. Partially visualized left apical pleural effusion Electronically Signed   By: Donavan Foil M.D.   On: 09/16/2017 18:58   Ct Cervical Spine Wo Contrast  Result Date: 09/16/2017 CLINICAL DATA:  Tripped and fell EXAM: CT HEAD WITHOUT CONTRAST CT CERVICAL SPINE WITHOUT CONTRAST TECHNIQUE: Multidetector CT imaging of the head and cervical spine was performed following the standard protocol without intravenous contrast. Multiplanar CT image reconstructions of the  cervical spine were also generated. COMPARISON:  CT 09/13/2017, 07/17/2017 FINDINGS: CT HEAD FINDINGS Brain: No acute territorial infarction, hemorrhage or mass is visualized. Enlarged left greater than right extra-axial CSF spaces anteriorly which may reflect atrophy or chronic subdural fluid collections, no change. Mild small vessel ischemic changes of the white matter. Stable ventricle size Vascular: No hyperdense vessels.  Carotid vascular calcification Skull: No fracture Sinuses/Orbits: Postsurgical changes of the right ethmoid and maxillary sinuses with small fluid level in the right maxillary sinus Other: None CT CERVICAL SPINE FINDINGS Alignment: No subluxation.  Facet alignment within normal limits Skull base and vertebrae: No acute fracture. No primary bone lesion or focal pathologic process. Soft tissues and spinal [name]: No prevertebral fluid or swelling. No visible [name] hematoma. Disc levels: Mild degenerative changes at C5-C6. Facet degenerative changes at multiple levels. Upper chest: Partially visualized left apical pleural effusion. Apical emphysema Other: None IMPRESSION: 1. No CT evidence for acute intracranial abnormality. Atrophy and small vessel ischemic changes of the white matter. Stable enlarged extra-axial CSF spaces. 2. No acute osseous abnormality of the cervical spine 3. Partially visualized left apical pleural effusion Electronically Signed   By: Donavan Foil M.D.   On: 09/16/2017 18:58   Mr Jeri Cos And Wo Contrast  Result Date: 09/17/2017 CLINICAL DATA:  Initial evaluation for acute altered mental status, increasing confusion, frequent falls. History of lung cancer. EXAM: MRI HEAD WITHOUT AND WITH CONTRAST TECHNIQUE: Multiplanar, multiecho pulse sequences of the brain and surrounding structures were obtained without and with intravenous contrast. CONTRAST:  59m MULTIHANCE GADOBENATE DIMEGLUMINE 529 MG/ML IV SOLN COMPARISON:  Prior CT from 09/16/2017 as well as previous MRI from  07/07/2016. FINDINGS: Brain: Generalized age-related cerebral atrophy. Patchy T2/FLAIR hyperintensity within the periventricular and deep white matter both cerebral hemispheres most consistent with chronic small vessel ischemic  disease, mild in nature. No abnormal foci of restricted diffusion to suggest acute or subacute ischemia. Gray-white matter differentiation maintained. No evidence for acute or chronic intracranial hemorrhage. Small remote right cerebellar infarct noted. No other areas of remote cortical infarction. No mass lesion, midline shift or mass effect. No hydrocephalus. No extra-axial fluid collection. Two cherry gland within normal limits. No appreciable abnormal enhancement, although postcontrast sequences are degraded by motion. Vascular: Major intracranial vascular flow voids are maintained. Skull and upper cervical spine: Craniocervical junction normal. Upper cervical spine within normal limits. Bone marrow signal intensity within normal limits. No focal marrow replacing lesion. No scalp soft tissue abnormality. Sinuses/Orbits: Globes and orbital soft tissues within normal limits. Mild mucosal thickening within the right maxillary sinus, left sphenoid sinus, ethmoidal air cells. Anterior right ethmoidal retention cyst noted. Trace right mastoid effusion, of doubtful significance. Inner ear structures grossly normal. Other: None. IMPRESSION: 1. No acute intracranial abnormality. 2. Mild age-related cerebral atrophy with chronic small vessel ischemic disease. Small remote right cerebellar infarct. 3. No MRI evidence for intracranial metastatic disease. Electronically Signed   By: Jeannine Boga M.D.   On: 09/17/2017 01:28   Dg Chest Portable 1 View  Result Date: 09/16/2017 CLINICAL DATA:  Fall EXAM: PORTABLE CHEST 1 VIEW COMPARISON:  09/13/2017 FINDINGS: Upper normal heart size. Left pleural effusion and basilar pulmonary opacity. Diffuse interstitial prominence. Right jugular Port-A-Cath  tip at the cavoatrial junction. Two level midthoracic vertebral augmentation is stable. IMPRESSION: Stable left pleural effusion and dense basilar pulmonary opacity. Electronically Signed   By: Marybelle Killings M.D.   On: 09/16/2017 19:07    Assessment:  The patient is a 77 y.o. woman with squamous cell lung cancer and severe COPD.  She has had a general decline in her performance status over the past several weeks.  She has fluctuated between wanting supportive care and Hospice care to recently considering treatment if her performnace status improved.  She was admitted with confusion and falls secondary to hypercapnic encephalopathy and an ESBL E coli UTI.  She is on meropenem.  Head MRI revealed no acute abnormality.  Symptomatically, she feels back to baseline.  She wants to go home.  Plan:   1.  Oncology:  We discussed her thoughts about her present medical care.  She wishes to be discharged home to Hospice.  She wishes to follow-up periodically in clinic.  If her health improves, she would like to consider treatment.  2.  Pulmonary:  Severe COPD. Plan to follow left sided effusion.  May require thoracentesis or Pleurex catheter drain if fluid becomes problematic.  3.  Infectious disease:  Suspect ESBL E coli has colonized bladder.  Appreciate ID input.  4.  Neurology:  Patient at baseline.  Confusion secondary to CO2 retention.  Head MRI reveals no metastatic disease.  5.  Disposition:  Anticipate discharge home today under the care of Hospice.  Code status DNR/DNI.  She will be seen in the medical oncology clinic in follow-up in the next 7-10 days.   Thank you for allowing me to participate in [name] [name] 's care.  I will follow her closely with you while hospitalized and after discharge in the outpatient department.   Lequita Asal, MD  09/18/2017, 5:38 AM

## 2017-09-18 NOTE — Discharge Summary (Addendum)
[name] [name], is a 77 y.o. female  DOB 1940/10/03  MRN [id number].  Admission date:  09/16/2017  Admitting Physician  Lance Coon, MD  Discharge Date:  09/18/2017   Primary MD  Tracie Harrier, MD  Recommendations for primary care physician for things to follow:   Follow with PCP in 1 week Follow-up with Dr. Nolon Stalls in 10 days or as scheduled.   Admission Diagnosis  Confusion [R41.0] ESBL (extended spectrum beta-lactamase) producing bacteria infection [A49.9, Z16.12] Acute cystitis without hematuria [N30.00] Acute respiratory failure with hypercapnia (HCC) [J96.02] Frequent falls [R29.6] UTI (urinary tract infection) [N39.0]   Discharge Diagnosis  Confusion [R41.0] ESBL (extended spectrum beta-lactamase) producing bacteria infection [A49.9, Z16.12] Acute cystitis without hematuria [N30.00] Acute respiratory failure with hypercapnia (HCC) [J96.02] Frequent falls [R29.6] UTI (urinary tract infection) [N39.0]    Principal Problem:   UTI due to extended-spectrum beta lactamase (ESBL) producing Escherichia coli Active Problems:   Chronic systolic CHF (congestive heart failure) (HCC)   UTI (urinary tract infection)   CAD (coronary artery disease)   Mixed hyperlipidemia   Primary cancer of left lower lobe of lung (HCC)   COPD with acute exacerbation (HCC)      Past Medical History:  Diagnosis Date  . Asthma   . Breast cancer ([place]) 2009   left  . Cancer ([place])   . CHF (congestive heart failure) ([place])   . CHF (congestive heart failure) ([place])   . Chronic UTI   . COPD (chronic obstructive pulmonary disease) ([place])   . Dizziness   . Fibromyalgia   . Hypertension   . Neuropathy   . Personal history of tobacco use, presenting hazards to health 05/17/2015  . Polyp, larynx   . RA (rheumatoid arthritis)  ([place])   . Sinus infection    recent  . Stumbling gait    to the left  . Supplemental oxygen dependent    2.5l    Past Surgical History:  Procedure Laterality Date  . ABDOMINAL HYSTERECTOMY    . BREAST LUMPECTOMY Left 2009   chemo and radiation  . CYST EXCISION Left 02/27/2015   Procedure: CYST REMOVAL;  Surgeon: Hessie Knows, MD;  Location: [facility] ORS;  Service: Orthopedics;  Laterality: Left;  . EYE MUSCLE SURGERY Right    13 surgeries  . FLEXIBLE BRONCHOSCOPY N/A 07/01/2016   Procedure: FLEXIBLE BRONCHOSCOPY;  Surgeon: Wilhelmina Mcardle, MD;  Location: [facility];  Service: Pulmonary;  Laterality: N/A;  . FLEXIBLE BRONCHOSCOPY N/A 02/09/2017   Procedure: FLEXIBLE BRONCHOSCOPY;  Surgeon: [name], MD;  Location: [facility];  Service: Pulmonary;  Laterality: N/A;  . KYPHOPLASTY N/A 05/22/2017   Procedure: Celine Ahr;  Surgeon: Hessie Knows, MD;  Location: [facility];  Service: Orthopedics;  Laterality: N/A;  . PORTA CATH INSERTION N/A 03/04/2017   Procedure: PORTA CATH INSERTION;  Surgeon: [name], MD;  Location: [facility];  Service: Cardiovascular;  Laterality: N/A;  . THUMB ARTHROSCOPY Left        History of present illness and  Hospital Course:     Kindly see H&P for history of present illness and admission details, please review complete Labs, Consult reports and Test reports for all details in brief  HPI  from the history and physical done on the day of admission 77 year old female patient with history of recently diagnosed with the UTI and was here in the emergency room on September 1 and was given fosfomycin and discharged from ER, went home and came  back because of fall.  Patient admitted because of fall and found to have ESBL UTI.   Hospital Course  1.  #1 fall secondary to mechanical fall she told me that she slipped on her socks and fell.  MRI of the brain did not show any metastatic disease. 2 acute on chronic respiratory failure with  hypoxia, hypercapnia: Secondary to lung cancer, pleural effusion: She has required BiPAP admitted to stepdown status.  Venous blood gas on admission showed pH 7.21, PCO2 95, admitted to intensive care unit for to monitor respiratory status.  Patient had altered mental status on admission secondary to hypercapnia: Improved now. #3 ESBL UTI: Patient received meropenem since admission, curbside consult obtained with Dr. Steva Ready mentioned that it may be colonization and do not require antibiotic treatment.  Patient denies any dysuria, fever.  Had a history of recurrent UTIs before and also received Levaquin, fosfomycin, patient was seen by Dr. Ola Spurr before and also had history of colonization. 4.  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  She is not symptomatic today. 5.  Anemia, thrombocytopenia secondary to cancer. 6.  Chronic diastolic heart failure; able  7 CODE STATUS is DO NOT RESUSCITATE at this time.  Patient wanted to be DNR.  Son aware of her CODE STATUS and he is having a difficult time accepting patient decision. 8.  Frequent falls, acute encephalopathy secondary to hypercapnia, UTI: Seen by physical therapy recommended home health physical therapy.,  Continue Premier protein twice daily, Magic cups. 9.  Severe malnutrition in the context of chronic illness, liberalize diet to regular diet,  Can start hospice services. Discharge Condition: stable   Follow UP  Follow-up Information    Tracie Harrier, MD. Schedule an appointment as soon as possible for a visit in 1 week(s).   Specialty:  Internal Medicine Contact information: [place] [address] [phone]        Lequita Asal, MD. Schedule an appointment as soon as  possible for a visit in 1 week(s).   Specialty:  Hematology and Oncology Contact information: [place] [address] [phone]             Discharge Instructions  and  Discharge Medications      Allergies as of 09/18/2017      Reactions   Contrast Media [iodinated Diagnostic Agents] Shortness Of Breath   Acute onset of shortness of breath following CT contrast administration 06/18/17. Sent to the ED. Dr. Kerman Passey documented incident as an allergic reaction. Needs premedications prior to future contrast media injections. Previous questionable contrast allergy, so the patient was given entire or partial premedications without complications. She was not given any premedications on 06/18/17, resulting in shortness of breath and an ED visit.   Sulfa Antibiotics Other (See Comments)   Unknown   Tetracycline Hives   White Petrolatum Other (See Comments)   Blisters   Amoxicillin-pot Clavulanate Rash, Other (See Comments)   Blisters in mouth Has patient had a PCN reaction causing immediate rash, facial/tongue/throat swelling, SOB or lightheadedness with hypotension: No Has patient had a PCN reaction causing severe rash involving mucus membranes or skin necrosis: No Has patient had a PCN reaction that required hospitalization: No Has patient had a PCN reaction occurring within the last 10 years: No  If all of the above answers are "NO", then may proceed with Cephalosporin use.   Tape Rash      Medication List    TAKE these medications   albuterol 108 (90 Base) MCG/ACT inhaler Commonly known as:  PROVENTIL HFA;VENTOLIN HFA Inhale 1-2 puffs into the lungs every 6 (six) hours as needed for wheezing or shortness of breath. What changed:  how much to take   Lorain Medication Name: nebulizer and supplies DX: J96.11/J44.9 [id number]   clonazePAM 1 MG tablet Commonly known as:  KLONOPIN Take 1 mg by mouth daily as needed for anxiety.    fentaNYL 50 MCG/HR Commonly known as:  DURAGESIC - dosed mcg/hr Place 1 patch (50 mcg total) onto the skin every 3 (three) days.   gabapentin 600 MG tablet Commonly known as:  NEURONTIN Take 600 mg by mouth 3 (three) times daily.   HYDROcodone-acetaminophen 5-325 MG tablet Commonly known as:  NORCO/VICODIN Take 1 tablet by mouth 3 (three) times daily as needed.   nitrofurantoin 100 MG capsule Commonly known as:  MACRODANTIN Take 1 capsule (100 mg total) by mouth 4 (four) times daily for 7 days.   NITROSTAT 0.4 MG SL tablet Generic drug:  nitroGLYCERIN Place 0.4 mg under the tongue every 5 (five) minutes as needed for chest pain.   ondansetron 8 MG tablet Commonly known as:  ZOFRAN Take 1 tablet (8 mg total) by mouth 2 (two) times daily as needed. Start on the third day after chemotherapy. What changed:  reasons to take this   SPIRIVA RESPIMAT 2.5 MCG/ACT Aers Generic drug:  Tiotropium Bromide Monohydrate Inhale 2.5 mcg into the lungs 2 (two) times daily.   traMADol 50 MG tablet Commonly known as:  ULTRAM Take 50 mg by mouth every 6 (six) hours as needed.   zolpidem 5 MG tablet Commonly known as:  AMBIEN Take 5 mg by mouth at bedtime as needed for sleep.         Diet and Activity recommendation: See Discharge Instructions above   Consults obtained -critical care, physical therapy, registered dietitian   Major procedures and Radiology Reports - PLEASE review detailed and final reports for all details, in brief -    Dg Chest 2 View  Result Date: 09/13/2017 CLINICAL DATA:  77 year old female with history of known lung cancer, currently under treatment. Frequent falls. Shortness of breath. EXAM: CHEST - 2 VIEW COMPARISON:  Chest x-ray 07/27/2017. FINDINGS: Right internal jugular single-lumen porta cath with tip terminating at the superior cavoatrial junction. Chronic atelectasis and scarring in the left lower lobe, similar to prior studies. Chronic moderate to large  left pleural effusion is also similar. Right lung is clear. No right pleural effusion. No evidence of pulmonary edema. Mild diffuse interstitial prominence, similar to prior studies. Heart size is upper limits of normal. Ectasia of thoracic aortic arch with extensive aortic atherosclerosis, similar to prior studies. IMPRESSION: 1. Chronic changes similar to prior examinations, as above. No definite radiographic evidence of acute cardiopulmonary disease. 2. Aortic atherosclerosis. Electronically Signed   By: Vinnie Langton M.D.   On: 09/13/2017 18:38   Ct Head Wo Contrast  Result Date: 09/16/2017 CLINICAL DATA:  Tripped and fell EXAM: CT HEAD WITHOUT CONTRAST CT CERVICAL SPINE WITHOUT CONTRAST TECHNIQUE: Multidetector CT imaging of the head and cervical spine was performed following the standard protocol without intravenous contrast. Multiplanar CT image reconstructions of the cervical spine were also generated. COMPARISON:  CT 09/13/2017, 07/17/2017 FINDINGS: CT HEAD FINDINGS Brain:  No acute territorial infarction, hemorrhage or mass is visualized. Enlarged left greater than right extra-axial CSF spaces anteriorly which may reflect atrophy or chronic subdural fluid collections, no change. Mild small vessel ischemic changes of the white matter. Stable ventricle size Vascular: No hyperdense vessels.  Carotid vascular calcification Skull: No fracture Sinuses/Orbits: Postsurgical changes of the right ethmoid and maxillary sinuses with small fluid level in the right maxillary sinus Other: None CT CERVICAL SPINE FINDINGS Alignment: No subluxation.  Facet alignment within normal limits Skull base and vertebrae: No acute fracture. No primary bone lesion or focal pathologic process. Soft tissues and spinal canal: No prevertebral fluid or swelling. No visible canal hematoma. Disc levels: Mild degenerative changes at C5-C6. Facet degenerative changes at multiple levels. Upper chest: Partially visualized left apical  pleural effusion. Apical emphysema Other: None IMPRESSION: 1. No CT evidence for acute intracranial abnormality. Atrophy and small vessel ischemic changes of the white matter. Stable enlarged extra-axial CSF spaces. 2. No acute osseous abnormality of the cervical spine 3. Partially visualized left apical pleural effusion Electronically Signed   By: Donavan Foil M.D.   On: 09/16/2017 18:58   Ct Head Wo Contrast  Result Date: 09/13/2017 CLINICAL DATA:  77 year old female with history of fall with injury to the head. EXAM: CT HEAD WITHOUT CONTRAST CT CERVICAL SPINE WITHOUT CONTRAST TECHNIQUE: Multidetector CT imaging of the head and cervical spine was performed following the standard protocol without intravenous contrast. Multiplanar CT image reconstructions of the cervical spine were also generated. COMPARISON:  Head CT 07/17/2017.  Cervical spine CT 12/11/2016. FINDINGS: CT HEAD FINDINGS Brain: Mild cerebral atrophy. Patchy areas of decreased attenuation are noted throughout the deep and periventricular white matter of the cerebral hemispheres bilaterally, compatible with chronic microvascular ischemic disease. No evidence of acute infarction, hemorrhage, hydrocephalus, extra-axial collection or mass lesion/mass effect. Vascular: No hyperdense vessel or unexpected calcification. Skull: Normal. Negative for fracture or focal lesion. Sinuses/Orbits: No acute finding. Other: None. CT CERVICAL SPINE FINDINGS Alignment: Normal. Skull base and vertebrae: No acute fracture. No primary bone lesion or focal pathologic process. Soft tissues and spinal canal: No prevertebral fluid or swelling. No visible canal hematoma. Disc levels: Very mild multilevel degenerative disc disease. Moderate multilevel facet arthropathy (left greater than right). Upper chest: Chronic left pleural effusion incompletely imaged. Right internal jugular central venous catheter. Other: None. IMPRESSION: 1. No evidence of significant acute traumatic  injury to the skull, brain or cervical spine. 2. Mild cerebral atrophy with mild chronic microvascular ischemic changes in the cerebral white matter. 3. Very mild multilevel degenerative disc disease and moderate multilevel facet arthropathy. 4. Chronic left pleural effusion. Electronically Signed   By: Vinnie Langton M.D.   On: 09/13/2017 18:28   Ct Cervical Spine Wo Contrast  Result Date: 09/16/2017 CLINICAL DATA:  Tripped and fell EXAM: CT HEAD WITHOUT CONTRAST CT CERVICAL SPINE WITHOUT CONTRAST TECHNIQUE: Multidetector CT imaging of the head and cervical spine was performed following the standard protocol without intravenous contrast. Multiplanar CT image reconstructions of the cervical spine were also generated. COMPARISON:  CT 09/13/2017, 07/17/2017 FINDINGS: CT HEAD FINDINGS Brain: No acute territorial infarction, hemorrhage or mass is visualized. Enlarged left greater than right extra-axial CSF spaces anteriorly which may reflect atrophy or chronic subdural fluid collections, no change. Mild small vessel ischemic changes of the white matter. Stable ventricle size Vascular: No hyperdense vessels.  Carotid vascular calcification Skull: No fracture Sinuses/Orbits: Postsurgical changes of the right ethmoid and maxillary sinuses with small fluid level in  the right maxillary sinus Other: None CT CERVICAL SPINE FINDINGS Alignment: No subluxation.  Facet alignment within normal limits Skull base and vertebrae: No acute fracture. No primary bone lesion or focal pathologic process. Soft tissues and spinal canal: No prevertebral fluid or swelling. No visible canal hematoma. Disc levels: Mild degenerative changes at C5-C6. Facet degenerative changes at multiple levels. Upper chest: Partially visualized left apical pleural effusion. Apical emphysema Other: None IMPRESSION: 1. No CT evidence for acute intracranial abnormality. Atrophy and small vessel ischemic changes of the white matter. Stable enlarged extra-axial  CSF spaces. 2. No acute osseous abnormality of the cervical spine 3. Partially visualized left apical pleural effusion Electronically Signed   By: Donavan Foil M.D.   On: 09/16/2017 18:58   Ct Cervical Spine Wo Contrast  Result Date: 09/13/2017 CLINICAL DATA:  77 year old female with history of fall with injury to the head. EXAM: CT HEAD WITHOUT CONTRAST CT CERVICAL SPINE WITHOUT CONTRAST TECHNIQUE: Multidetector CT imaging of the head and cervical spine was performed following the standard protocol without intravenous contrast. Multiplanar CT image reconstructions of the cervical spine were also generated. COMPARISON:  Head CT 07/17/2017.  Cervical spine CT 12/11/2016. FINDINGS: CT HEAD FINDINGS Brain: Mild cerebral atrophy. Patchy areas of decreased attenuation are noted throughout the deep and periventricular white matter of the cerebral hemispheres bilaterally, compatible with chronic microvascular ischemic disease. No evidence of acute infarction, hemorrhage, hydrocephalus, extra-axial collection or mass lesion/mass effect. Vascular: No hyperdense vessel or unexpected calcification. Skull: Normal. Negative for fracture or focal lesion. Sinuses/Orbits: No acute finding. Other: None. CT CERVICAL SPINE FINDINGS Alignment: Normal. Skull base and vertebrae: No acute fracture. No primary bone lesion or focal pathologic process. Soft tissues and spinal canal: No prevertebral fluid or swelling. No visible canal hematoma. Disc levels: Very mild multilevel degenerative disc disease. Moderate multilevel facet arthropathy (left greater than right). Upper chest: Chronic left pleural effusion incompletely imaged. Right internal jugular central venous catheter. Other: None. IMPRESSION: 1. No evidence of significant acute traumatic injury to the skull, brain or cervical spine. 2. Mild cerebral atrophy with mild chronic microvascular ischemic changes in the cerebral white matter. 3. Very mild multilevel degenerative disc  disease and moderate multilevel facet arthropathy. 4. Chronic left pleural effusion. Electronically Signed   By: Vinnie Langton M.D.   On: 09/13/2017 18:28   Mr Jeri Cos And Wo Contrast  Result Date: 09/17/2017 CLINICAL DATA:  Initial evaluation for acute altered mental status, increasing confusion, frequent falls. History of lung cancer. EXAM: MRI HEAD WITHOUT AND WITH CONTRAST TECHNIQUE: Multiplanar, multiecho pulse sequences of the brain and surrounding structures were obtained without and with intravenous contrast. CONTRAST:  59m MULTIHANCE GADOBENATE DIMEGLUMINE 529 MG/ML IV SOLN COMPARISON:  Prior CT from 09/16/2017 as well as previous MRI from 07/07/2016. FINDINGS: Brain: Generalized age-related cerebral atrophy. Patchy T2/FLAIR hyperintensity within the periventricular and deep white matter both cerebral hemispheres most consistent with chronic small vessel ischemic disease, mild in nature. No abnormal foci of restricted diffusion to suggest acute or subacute ischemia. Gray-white matter differentiation maintained. No evidence for acute or chronic intracranial hemorrhage. Small remote right cerebellar infarct noted. No other areas of remote cortical infarction. No mass lesion, midline shift or mass effect. No hydrocephalus. No extra-axial fluid collection. Two cherry gland within normal limits. No appreciable abnormal enhancement, although postcontrast sequences are degraded by motion. Vascular: Major intracranial vascular flow voids are maintained. Skull and upper cervical spine: Craniocervical junction normal. Upper cervical spine within normal limits. Bone marrow  signal intensity within normal limits. No focal marrow replacing lesion. No scalp soft tissue abnormality. Sinuses/Orbits: Globes and orbital soft tissues within normal limits. Mild mucosal thickening within the right maxillary sinus, left sphenoid sinus, ethmoidal air cells. Anterior right ethmoidal retention cyst noted. Trace right mastoid  effusion, of doubtful significance. Inner ear structures grossly normal. Other: None. IMPRESSION: 1. No acute intracranial abnormality. 2. Mild age-related cerebral atrophy with chronic small vessel ischemic disease. Small remote right cerebellar infarct. 3. No MRI evidence for intracranial metastatic disease. Electronically Signed   By: Jeannine Boga M.D.   On: 09/17/2017 01:28   Dg Chest Portable 1 View  Result Date: 09/16/2017 CLINICAL DATA:  Fall EXAM: PORTABLE CHEST 1 VIEW COMPARISON:  09/13/2017 FINDINGS: Upper normal heart size. Left pleural effusion and basilar pulmonary opacity. Diffuse interstitial prominence. Right jugular Port-A-Cath tip at the cavoatrial junction. Two level midthoracic vertebral augmentation is stable. IMPRESSION: Stable left pleural effusion and dense basilar pulmonary opacity. Electronically Signed   By: Marybelle Killings M.D.   On: 09/16/2017 19:07    Micro Results    Recent Results (from the past 240 hour(s))  Urine culture     Status: Abnormal   Collection Time: 09/13/17  7:17 PM  Result Value Ref Range Status   Specimen Description   Final    URINE, RANDOM Performed at [facility], [address] [place], [place] [address]    Special Requests   Final    NONE Performed at [facility], [address] [place], [place] [address]    Culture (A)  Final    >=100,000 COLONIES/mL ESCHERICHIA COLI Confirmed Extended Spectrum Beta-Lactamase Producer (ESBL).  In bloodstream infections from ESBL organisms, carbapenems are preferred over piperacillin/tazobactam. They are shown to have a lower risk of mortality.    Report Status 09/16/2017 FINAL  Final   Organism ID, Bacteria ESCHERICHIA COLI (A)  Final      Susceptibility   Escherichia coli - MIC*    AMPICILLIN >=32 RESISTANT Resistant     CEFAZOLIN >=64 RESISTANT Resistant     CEFTRIAXONE >=64 RESISTANT Resistant     CIPROFLOXACIN >=4 RESISTANT Resistant     GENTAMICIN <=1 SENSITIVE  Sensitive     IMIPENEM <=0.25 SENSITIVE Sensitive     NITROFURANTOIN 32 SENSITIVE Sensitive     TRIMETH/SULFA >=320 RESISTANT Resistant     AMPICILLIN/SULBACTAM 16 INTERMEDIATE Intermediate     PIP/TAZO <=4 SENSITIVE Sensitive     Extended ESBL POSITIVE Resistant     * >=100,000 COLONIES/mL ESCHERICHIA COLI  Blood Culture (routine x 2)     Status: None (Preliminary result)   Collection Time: 09/16/17  7:31 PM  Result Value Ref Range Status   Specimen Description BLOOD RIGHT EJ  Final   Special Requests   Final    BOTTLES DRAWN AEROBIC AND ANAEROBIC Blood Culture adequate volume   Culture   Final    NO GROWTH 2 DAYS Performed at [facility], [address], [place], [place] [address]    Report Status PENDING  Incomplete  Blood Culture (routine x 2)     Status: None (Preliminary result)   Collection Time: 09/16/17  7:32 PM  Result Value Ref Range Status   Specimen Description BLOOD LEFT ARM  Final   Special Requests   Final    BOTTLES DRAWN AEROBIC AND ANAEROBIC Blood Culture adequate volume   Culture   Final    NO GROWTH 2 DAYS Performed at [facility], [address] [place], [place] [address]    Report Status PENDING  Incomplete  MRSA PCR Screening     Status: None   Collection Time: 09/17/17  2:55 AM  Result Value Ref Range Status   MRSA by PCR NEGATIVE NEGATIVE Final    Comment:        The GeneXpert MRSA Assay (FDA approved for NASAL specimens only), is one component of a comprehensive MRSA colonization surveillance program. It is not intended to diagnose MRSA infection nor to guide or monitor treatment for MRSA infections. Performed at [facility], [address] [place], [place] [address]        Today   Subjective:   Annaliz Aven today has no headache,no chest abdominal pain,no new weakness tingling or numbness, feels much better wants to go home today.   Objective:   Blood pressure (!) 149/85, pulse 78, temperature  97.6 F (36.4 C), temperature source Oral, resp. rate 18, height _0  (1.6 m), weight 48 kg, SpO2 98 %.   Intake/Output Summary (Last 24 hours) at 09/18/2017 1125 Last data filed at 09/17/2017 2352 Gross per 24 hour  Intake 620 ml  Output -  Net 620 ml    Exam Awake Alert, Oriented x 3, No new F.N deficits, Normal affect [place].AT,PERRAL Supple Neck,No JVD, No cervical lymphadenopathy appriciated.  Symmetrical Chest wall movement, Good air movement bilaterally, CTAB RRR,No Gallops,Rubs or new Murmurs, No Parasternal Heave +ve B.Sounds, Abd Soft, Non tender, No organomegaly appriciated, No rebound -guarding or rigidity. No Cyanosis, Clubbing or edema, No new Rash or bruise  Data Review   CBC w Diff:  Lab Results  Component Value Date   WBC 3.7 09/17/2017   HGB 10.1 (L) 09/17/2017   HGB 14.0 03/09/2013   HCT 30.4 (L) 09/17/2017   HCT 42.4 03/09/2013   PLT 141 (L) 09/17/2017   PLT 165 03/09/2013   LYMPHOPCT 22 09/16/2017   LYMPHOPCT 30.1 03/09/2013   MONOPCT 11 09/16/2017   MONOPCT 6.1 03/09/2013   EOSPCT 6 09/16/2017   EOSPCT 2.5 03/09/2013   BASOPCT 1 09/16/2017   BASOPCT 1.2 03/09/2013    CMP:  Lab Results  Component Value Date   NA 137 09/17/2017   NA 135 (L) 03/09/2013   K 4.4 09/17/2017   K 3.9 03/09/2013   CL 98 09/17/2017   CL 97 (L) 03/09/2013   CO2 32 09/17/2017   CO2 29 03/09/2013   BUN 8 09/17/2017   BUN 7 03/09/2013   CREATININE 0.80 09/17/2017   CREATININE 1.03 03/09/2013   PROT 6.2 (L) 09/16/2017   PROT 8.0 03/09/2013   ALBUMIN 3.1 (L) 09/16/2017   ALBUMIN 3.8 03/09/2013   BILITOT 0.5 09/16/2017   BILITOT 0.3 03/09/2013   ALKPHOS 74 09/16/2017   ALKPHOS 125 (H) 03/09/2013   AST 17 09/16/2017   AST 19 03/09/2013   ALT 10 09/16/2017   ALT 18 03/09/2013  .   Total Time in preparing paper work, data evaluation and todays exam - 39 minutes  Epifanio Lesches M.D on 09/18/2017 at 11:25 AM    Note: This dictation was prepared with Dragon  dictation along with smaller phrase technology. Any transcriptional errors that result from this process are unintentional.

## 2017-09-18 NOTE — Progress Notes (Signed)
New referral for [facility] services at home received prior to patient's admission to [facility] on 9/4. Writer met in the room with patient to initiate education regarding hospice services, philosophy and team approach to care with understanding voiced. Patient currently has no DME needs, oxygen ibn place in the home. Plan is for discharge via [facility] today, she reports her son or grandson will be bringing her home. Family to bring portable oxygen tank for transport. Updated notes faxed to referral. Hospice care team updated. Flo Shanks RN,BSN, [facility], hospital Liaison [phone]

## 2017-09-21 ENCOUNTER — Other Ambulatory Visit: Payer: Self-pay

## 2017-09-21 LAB — CULTURE, BLOOD (ROUTINE X 2)
CULTURE: NO GROWTH
Culture: NO GROWTH
SPECIAL REQUESTS: ADEQUATE
SPECIAL REQUESTS: ADEQUATE

## 2017-09-21 NOTE — Patient Outreach (Addendum)
[facility]) Care Management  09/21/2017  KALIYAN OSBOURN January 11, 1941 [id number]       EMMI-General Discharge RED ON EMMI ALERT Day # 1 Date: 09/20/17 Red Alert Reason: " Got discharge papers? No"     Red on EMMI alert received. Noted in chart patient discharged home with [facility] services. Patient not appropriate for automated post discharge calls.        Plan: RN CM will close case at this time. RN CM will notify [facility] administrative assistant to deactivate EMMI calls.   Enzo Montgomery, RN,BSN,CCM [facility] Telephonic Care Management Coordinator Direct Phone: [phone] Toll Free: [phone] Fax: [phone]

## 2017-09-22 ENCOUNTER — Telehealth: Payer: Self-pay | Admitting: *Deleted

## 2017-09-22 NOTE — Telephone Encounter (Signed)
After discussing with B Pearline Cables NP I called Crystal back and asked that she cehck with the ordering providers regarding these orders. She will have patient nurse to contact them

## 2017-09-22 NOTE — Telephone Encounter (Signed)
Patient is to have CBC, CMP, Thyroid Panel, [place], Urine Culture for Dr Ginette Pitman tomorrow and an MRI T Spine for ortho and is asking if she really needs to have these tests done now. Please advise

## 2017-09-24 ENCOUNTER — Ambulatory Visit: Payer: Medicare HMO

## 2017-09-27 ENCOUNTER — Ambulatory Visit: Payer: Medicare HMO

## 2017-09-27 ENCOUNTER — Ambulatory Visit
Admission: RE | Admit: 2017-09-27 | Discharge: 2017-09-27 | Disposition: A | Discharge status: Home / Self Care | Source: Ambulatory Visit | Attending: Orthopedic Surgery | Admitting: Orthopedic Surgery

## 2017-09-27 DIAGNOSIS — R600 Localized edema: Secondary | ICD-10-CM | POA: Insufficient documentation

## 2017-09-27 DIAGNOSIS — M4854XA Collapsed vertebra, not elsewhere classified, thoracic region, initial encounter for fracture: Secondary | ICD-10-CM | POA: Insufficient documentation

## 2017-09-27 DIAGNOSIS — R29890 Loss of height: Secondary | ICD-10-CM | POA: Insufficient documentation

## 2017-09-27 DIAGNOSIS — M40294 Other kyphosis, thoracic region: Secondary | ICD-10-CM | POA: Insufficient documentation

## 2017-09-27 DIAGNOSIS — Z9889 Other specified postprocedural states: Secondary | ICD-10-CM | POA: Diagnosis not present

## 2017-09-27 DIAGNOSIS — M546 Pain in thoracic spine: Secondary | ICD-10-CM | POA: Insufficient documentation

## 2017-09-27 DIAGNOSIS — S22050A Wedge compression fracture of T5-T6 vertebra, initial encounter for closed fracture: Secondary | ICD-10-CM | POA: Diagnosis not present

## 2017-09-30 DIAGNOSIS — J449 Chronic obstructive pulmonary disease, unspecified: Secondary | ICD-10-CM | POA: Diagnosis not present

## 2017-09-30 DIAGNOSIS — C801 Malignant (primary) neoplasm, unspecified: Secondary | ICD-10-CM | POA: Diagnosis not present

## 2017-09-30 DIAGNOSIS — R0602 Shortness of breath: Secondary | ICD-10-CM | POA: Diagnosis not present

## 2017-09-30 DIAGNOSIS — N390 Urinary tract infection, site not specified: Secondary | ICD-10-CM | POA: Diagnosis not present

## 2017-09-30 DIAGNOSIS — D649 Anemia, unspecified: Secondary | ICD-10-CM | POA: Diagnosis not present

## 2017-09-30 DIAGNOSIS — I2510 Atherosclerotic heart disease of native coronary artery without angina pectoris: Secondary | ICD-10-CM | POA: Diagnosis not present

## 2017-09-30 DIAGNOSIS — Z0000 Encounter for general adult medical examination without abnormal findings: Secondary | ICD-10-CM | POA: Diagnosis not present

## 2017-09-30 DIAGNOSIS — M797 Fibromyalgia: Secondary | ICD-10-CM | POA: Diagnosis not present

## 2017-10-01 ENCOUNTER — Inpatient Hospital Stay: Payer: Medicare HMO | Attending: Hematology and Oncology

## 2017-10-02 DIAGNOSIS — M545 Low back pain: Secondary | ICD-10-CM | POA: Diagnosis not present

## 2017-10-02 LAB — BLOOD GAS, VENOUS
Acid-Base Excess: 7.4 mmol/L — ABNORMAL HIGH (ref 0.0–2.0)
BICARBONATE: 38 mmol/L — AB (ref 20.0–28.0)
PCO2 VEN: 95 mmHg — AB (ref 44.0–60.0)
Patient temperature: 37
pH, Ven: 7.21 — ABNORMAL LOW (ref 7.250–7.430)

## 2017-10-13 ENCOUNTER — Other Ambulatory Visit: Payer: Self-pay | Admitting: *Deleted

## 2017-10-13 MED ORDER — HYDROCODONE-ACETAMINOPHEN 5-325 MG PO TABS: 1.0000 | ORAL_TABLET | Freq: Three times a day (TID) | ORAL | 0 refills | Status: DC | PRN

## 2017-10-20 ENCOUNTER — Other Ambulatory Visit: Payer: Self-pay | Admitting: *Deleted

## 2017-10-20 NOTE — Telephone Encounter (Signed)
Ok

## 2017-10-20 NOTE — Telephone Encounter (Signed)
Hospice nurse called asking for  Anew order for patient clonazepam 1 mg to Be twice a day as this is the way patient has been taking it. Current prescription written for daily dosing

## 2017-10-21 MED ORDER — CLONAZEPAM 1 MG PO TABS: 1.0000 mg | ORAL_TABLET | Freq: Two times a day (BID) | ORAL | 1 refills | Status: AC

## 2017-10-21 MED ORDER — CLONAZEPAM 1 MG PO TABS: 1.0000 mg | ORAL_TABLET | Freq: Two times a day (BID) | ORAL | 1 refills | Status: DC

## 2017-10-22 ENCOUNTER — Ambulatory Visit: Payer: Medicare HMO

## 2017-11-02 ENCOUNTER — Ambulatory Visit
Admission: RE | Admit: 2017-11-02 | Discharge: 2017-11-02 | Disposition: A | Discharge status: Home / Self Care | Source: Ambulatory Visit | Attending: Radiation Oncology | Admitting: Radiation Oncology

## 2017-11-02 ENCOUNTER — Other Ambulatory Visit: Payer: Self-pay

## 2017-11-02 ENCOUNTER — Encounter: Payer: Self-pay | Admitting: Radiation Oncology

## 2017-11-02 VITALS — BP 88/61 | HR 89 | Temp 97.8°F | Resp 18 | Wt 99.5 lb

## 2017-11-02 DIAGNOSIS — C3432 Malignant neoplasm of lower lobe, left bronchus or lung: Secondary | ICD-10-CM

## 2017-11-02 NOTE — Progress Notes (Signed)
Radiation Oncology Follow up Note  Name: [name] [name]   Date:   11/02/2017 MRN:  [id number] DOB: [birth year]    This 77 y.o. 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 She has been treated in the past with multiple chemotherapy agents including immunotherapy. She's currently under hospice care at home..patient has problems with metabolic encephalopathy as well as confusionassociated with Escherichia coli UTI. She states today she never agreed to discontinue treatment although she is under hospice home care at this point.  COMPLICATIONS OF TREATMENT: none  FOLLOW UP COMPLIANCE: keeps appointments   PHYSICAL EXAM:  BP (!) 88/61 (BP Location: Left Arm, Patient Position: Sitting)   Pulse 89   Temp 97.8 F (36.6 C) (Tympanic)   Resp 18   Wt 99 lb 8.6 oz (45.1 kg)   BMI 17.63 kg/m  Frail-appearing wheelchair-bound female in NAD. Patient is oxygen dependent.Well-developed well-nourished patient in NAD. HEENT reveals PERLA, EOMI, discs not visualized.  Oral cavity is clear. No oral mucosal lesions are identified. Neck is clear without evidence of cervical or supraclavicular adenopathy. Lungs are clear to A&P. Cardiac examination is essentially unremarkable with regular rate and rhythm without murmur rub or thrill. Abdomen is benign with no organomegaly or masses noted. Motor sensory and DTR levels are equal and symmetric in the upper and lower extremities. Cranial nerves II through XII are grossly intact. Proprioception is intact. No peripheral adenopathy or edema is identified. No motor or sensory levels are noted. Crude visual fields  are within normal range.  RADIOLOGY RESULTS: MRI scans of thoracic spine and brain reviewed  PLAN: this time patient remains under hospice at home care. I will be happy to reevaluate the patient any time for further palliative treatment. Her family doctor is prescribing her pain medication at this time. I have set up appointment for 6 months although I see no realadditive benefit radiation therapy at this time. We'll be happy to reevaluate her sooner at any time should further radiation therapy be indicated.  I would like to take this opportunity to thank you for allowing me to participate in the care of your patient.Noreene Filbert, MD

## 2017-11-10 ENCOUNTER — Other Ambulatory Visit: Payer: Self-pay | Admitting: *Deleted

## 2017-11-10 MED ORDER — HYDROCODONE-ACETAMINOPHEN 5-325 MG PO TABS: 1.0000 | ORAL_TABLET | ORAL | 0 refills | Status: DC | PRN

## 2017-11-10 NOTE — Telephone Encounter (Signed)
Patient taking Norco 6 tabs a day and needs refill of it

## 2017-11-14 ENCOUNTER — Telehealth: Payer: Self-pay | Admitting: Oncology

## 2017-11-14 MED ORDER — NITROFURANTOIN MONOHYD MACRO 100 MG PO CAPS: 100.0000 mg | ORAL_CAPSULE | Freq: Two times a day (BID) | ORAL | 0 refills | Status: DC

## 2017-11-14 NOTE — Telephone Encounter (Signed)
Hospice RN Mariann Laster called and reports patient having "burning sensation" with urination. Patient is on hospice care.  Recently had UTI and finished a course of Nitrofurantoin. Culture sensitivity reviewed.  Will send patient another course of nitrofurantoin. Mariann Laster is notified.

## 2017-11-15 DIAGNOSIS — G893 Neoplasm related pain (acute) (chronic): Secondary | ICD-10-CM | POA: Insufficient documentation

## 2017-11-20 ENCOUNTER — Emergency Department
Admission: EM | Admit: 2017-11-20 | Discharge: 2017-11-20 | Disposition: A | Discharge status: Home / Self Care | Attending: Emergency Medicine | Admitting: Emergency Medicine

## 2017-11-20 ENCOUNTER — Other Ambulatory Visit: Payer: Self-pay

## 2017-11-20 ENCOUNTER — Emergency Department

## 2017-11-20 DIAGNOSIS — Z79899 Other long term (current) drug therapy: Secondary | ICD-10-CM | POA: Diagnosis not present

## 2017-11-20 DIAGNOSIS — J449 Chronic obstructive pulmonary disease, unspecified: Secondary | ICD-10-CM | POA: Insufficient documentation

## 2017-11-20 DIAGNOSIS — J45909 Unspecified asthma, uncomplicated: Secondary | ICD-10-CM | POA: Diagnosis not present

## 2017-11-20 DIAGNOSIS — I5022 Chronic systolic (congestive) heart failure: Secondary | ICD-10-CM | POA: Insufficient documentation

## 2017-11-20 DIAGNOSIS — R410 Disorientation, unspecified: Secondary | ICD-10-CM | POA: Diagnosis not present

## 2017-11-20 DIAGNOSIS — R05 Cough: Secondary | ICD-10-CM | POA: Diagnosis present

## 2017-11-20 DIAGNOSIS — I2510 Atherosclerotic heart disease of native coronary artery without angina pectoris: Secondary | ICD-10-CM | POA: Diagnosis not present

## 2017-11-20 DIAGNOSIS — Z853 Personal history of malignant neoplasm of breast: Secondary | ICD-10-CM | POA: Insufficient documentation

## 2017-11-20 DIAGNOSIS — F17210 Nicotine dependence, cigarettes, uncomplicated: Secondary | ICD-10-CM | POA: Diagnosis not present

## 2017-11-20 DIAGNOSIS — R300 Dysuria: Secondary | ICD-10-CM | POA: Diagnosis not present

## 2017-11-20 DIAGNOSIS — I110 Hypertensive heart disease with heart failure: Secondary | ICD-10-CM | POA: Insufficient documentation

## 2017-11-20 DIAGNOSIS — R4182 Altered mental status, unspecified: Secondary | ICD-10-CM | POA: Diagnosis not present

## 2017-11-20 LAB — URINALYSIS, COMPLETE (UACMP) WITH MICROSCOPIC
Bilirubin Urine: NEGATIVE
GLUCOSE, UA: NEGATIVE mg/dL
Ketones, ur: NEGATIVE mg/dL
Nitrite: NEGATIVE
PH: 6 (ref 5.0–8.0)
Protein, ur: NEGATIVE mg/dL
SPECIFIC GRAVITY, URINE: 1.005 (ref 1.005–1.030)
Squamous Epithelial / HPF: NONE SEEN (ref 0–5)

## 2017-11-20 LAB — BLOOD GAS, VENOUS
Acid-Base Excess: 6.1 mmol/L — ABNORMAL HIGH (ref 0.0–2.0)
BICARBONATE: 33.7 mmol/L — AB (ref 20.0–28.0)
O2 SAT: 83.7 %
PATIENT TEMPERATURE: 37
pCO2, Ven: 64 mmHg — ABNORMAL HIGH (ref 44.0–60.0)
pH, Ven: 7.33 (ref 7.250–7.430)
pO2, Ven: 52 mmHg — ABNORMAL HIGH (ref 32.0–45.0)

## 2017-11-20 LAB — CBC
HCT: 36.5 % (ref 36.0–46.0)
Hemoglobin: 11.4 g/dL — ABNORMAL LOW (ref 12.0–15.0)
MCH: 29.7 pg (ref 26.0–34.0)
MCHC: 31.2 g/dL (ref 30.0–36.0)
MCV: 95.1 fL (ref 80.0–100.0)
PLATELETS: 148 10*3/uL — AB (ref 150–400)
RBC: 3.84 MIL/uL — AB (ref 3.87–5.11)
RDW: 14.5 % (ref 11.5–15.5)
WBC: 6.6 10*3/uL (ref 4.0–10.5)
nRBC: 0 % (ref 0.0–0.2)

## 2017-11-20 LAB — COMPREHENSIVE METABOLIC PANEL
ALBUMIN: 3.6 g/dL (ref 3.5–5.0)
ALK PHOS: 115 U/L (ref 38–126)
ALT: 7 U/L (ref 0–44)
AST: 18 U/L (ref 15–41)
Anion gap: 11 (ref 5–15)
BILIRUBIN TOTAL: 0.5 mg/dL (ref 0.3–1.2)
BUN: 17 mg/dL (ref 8–23)
CALCIUM: 9 mg/dL (ref 8.9–10.3)
CO2: 31 mmol/L (ref 22–32)
Chloride: 96 mmol/L — ABNORMAL LOW (ref 98–111)
Creatinine, Ser: 1.16 mg/dL — ABNORMAL HIGH (ref 0.44–1.00)
GFR calc Af Amer: 52 mL/min — ABNORMAL LOW (ref >60)
GFR calc non Af Amer: 45 mL/min — ABNORMAL LOW (ref >60)
Glucose, Bld: 105 mg/dL — ABNORMAL HIGH (ref 70–99)
Potassium: 4.2 mmol/L (ref 3.5–5.1)
Sodium: 138 mmol/L (ref 135–145)
TOTAL PROTEIN: 7.2 g/dL (ref 6.5–8.1)

## 2017-11-20 LAB — GLUCOSE, CAPILLARY: GLUCOSE-CAPILLARY: 90 mg/dL (ref 70–99)

## 2017-11-20 LAB — TROPONIN I: Troponin I: <0.03 ng/mL (ref <0.03)

## 2017-11-20 NOTE — ED Triage Notes (Signed)
Patient coming [facility] from home. Per patient's family, patient increasingly more confused. Patient currently being treated for UTI. Patient had a fall today at home. Patient on 4L [place] of oxygen at baseline.

## 2017-11-20 NOTE — ED Notes (Signed)
Pt oob to [facility] - no results.

## 2017-11-20 NOTE — ED Notes (Signed)
Patient is resting comfortably.IV 22g place to left forearm. ABG drawn and sent. RT called and made aware of specimen.

## 2017-11-20 NOTE — Progress Notes (Signed)
ED visit made. Patient is currently followed by [facility], with a hospice diagnosis of lung cancer.  She is a DNR code, with out of facility DNR in place in the home.She is on chronic oxygen at baseline. Patient sent to the ED by her family for evaluation of confusion. Patient seen lying on the ED stretcher sleeping soundly, oxygen in place. No family at bedside. Writer spoke with EDP Dr. Corky Downs, chest xray did not show any acute changes, Urine culture pending, WBC WNL . Per Dr. Corky Downs Plan is for discharge home. Son to be contacted. Hospice team alerted to discharge.  Flo Shanks RN, BSN, [place] and [facility] [place], hospital Liaison [phone]

## 2017-11-20 NOTE — ED Notes (Signed)
X-ray at bedside

## 2017-11-20 NOTE — ED Provider Notes (Signed)
[facility] Emergency Department Provider Note   ____________________________________________    I have reviewed the triage vital signs and the nursing notes.   HISTORY  Chief Complaint Altered Mental Status     HPI [name] [name] is a 77 y.o. female who presents with complaints of cough. She states over the last 2 weeks she has had a discomfort in her left side, has gone through "2 bottles of antibiotics". She also states she "always" has UTIs. Apparently grandson thought she was confused this AM although he is not here currently. She denies abdominal pain. Patient has known Lung CA, no current therapy. Also known pleural effusion. Review of records demonstrates ESBL urine in the past. Denies fevers. Wears O2 continuously 4L   Past Medical History:  Diagnosis Date  . Asthma   . Breast cancer ([place]) 2009   left  . Cancer ([place])   . CHF (congestive heart failure) ([place])   . CHF (congestive heart failure) ([place])   . Chronic UTI   . COPD (chronic obstructive pulmonary disease) ([place])   . Dizziness   . Fibromyalgia   . Hypertension   . Neuropathy   . Personal history of tobacco use, presenting hazards to health 05/17/2015  . Polyp, larynx   . RA (rheumatoid arthritis) ([place])   . Sinus infection    recent  . Stumbling gait    to the left  . Supplemental oxygen dependent    2.5l    Patient Active Problem List   Diagnosis Date Noted  . Cancer-related pain 11/15/2017  . Protein-calorie malnutrition, severe 09/18/2017  . COPD with acute exacerbation ([place]) 09/17/2017  . Neoplastic malignant related fatigue 08/20/2017  . Acute respiratory failure ([place]) 07/17/2017  . Encephalopathy acute 06/30/2017  . UTI due to extended-spectrum beta lactamase (ESBL) producing Escherichia coli 05/21/2017  . Acute midline thoracic back pain 05/09/2017  . Compression fracture of T7 vertebra (H[facility]) 05/08/2017  . Rib pain on right side 05/08/2017  . Encounter for  antineoplastic immunotherapy 05/08/2017  . B12 deficiency 04/29/2017  . Anemia 04/11/2017  . History of recurrent UTIs 04/11/2017  . Diarrhea 03/28/2017  . Rib lesion 03/03/2017  . Cancer ([place]) 02/16/2017  . Goals of care, counseling/discussion 02/02/2017  . Collapse of left lung 02/02/2017  . Pleural effusion, left 02/02/2017  . HCAP (healthcare-associated pneumonia) 11/04/2016  . Recurrent UTI 09/08/2016  . Lung mass 09/08/2016  . Encounter for antineoplastic chemotherapy 07/24/2016  . Primary cancer of left lower lobe of lung ([place]) 06/25/2016  . Mixed hyperlipidemia 06/19/2016  . Peripheral neuropathy 06/19/2016  . Aneurysm of thoracic aorta ([place]) 04/29/2016  . Pain in limb 04/29/2016  . Acute delirium 03/06/2016  . Acute renal insufficiency 03/06/2016  . Pressure injury of skin 03/05/2016  . UTI (urinary tract infection) 03/04/2016  . Acute renal failure (ARF) ([place]) 12/04/2015  . Protein malnutrition ([place]) 11/24/2015  . Acute respiratory failure with hypoxia ([place])   . Hyponatremia   . Chronic systolic CHF (congestive heart failure) ([place]) 11/15/2015  . Sepsis ([place]) 11/15/2015  . Descending thoracic aortic aneurysm ([place]) 09/11/2015  . Personal history of tobacco use, presenting hazards to health 05/17/2015  . COPD, moderate ([place]) 05/19/2014  . Abnormal cardiovascular stress test 03/10/2014  . CAD (coronary artery disease) 02/25/2014  . Aneurysm of abdominal vessel ([place]) 02/24/2014  . Shortness of breath 02/24/2014  . Vocal cord polyp 02/13/2014  . Narcotic drug use 01/17/2013  . Fibromyalgia 05/11/2012  . Low back pain  05/11/2012  . Arthritis 11/03/2011  . Asthma 11/03/2011  . Breast cancer ([place]) 11/03/2011  . Osteoarthritis 06/17/2011    Past Surgical History:  Procedure Laterality Date  . ABDOMINAL HYSTERECTOMY    . BREAST LUMPECTOMY Left 2009   chemo and radiation  . CYST EXCISION Left 02/27/2015   Procedure: CYST REMOVAL;  Surgeon: Hessie Knows, MD;  Location:  [facility] ORS;  Service: Orthopedics;  Laterality: Left;  . EYE MUSCLE SURGERY Right    13 surgeries  . FLEXIBLE BRONCHOSCOPY N/A 07/01/2016   Procedure: FLEXIBLE BRONCHOSCOPY;  Surgeon: Wilhelmina Mcardle, MD;  Location: [facility] ORS;  Service: Pulmonary;  Laterality: N/A;  . FLEXIBLE BRONCHOSCOPY N/A 02/09/2017   Procedure: FLEXIBLE BRONCHOSCOPY;  Surgeon: [name], MD;  Location: [facility] ORS;  Service: Pulmonary;  Laterality: N/A;  . KYPHOPLASTY N/A 05/22/2017   Procedure: Celine Ahr;  Surgeon: Hessie Knows, MD;  Location: [facility] ORS;  Service: Orthopedics;  Laterality: N/A;  . PORTA CATH INSERTION N/A 03/04/2017   Procedure: PORTA CATH INSERTION;  Surgeon: [name], MD;  Location: [facility];  Service: Cardiovascular;  Laterality: N/A;  . THUMB ARTHROSCOPY Left     Prior to Admission medications   Medication Sig Start Date End Date Taking? Authorizing Provider  albuterol (PROVENTIL HFA;VENTOLIN HFA) 108 (90 Base) MCG/ACT inhaler Inhale 1-2 puffs into the lungs every 6 (six) hours as needed for wheezing or shortness of breath. Patient taking differently: Inhale 2 puffs into the lungs every 6 (six) hours as needed for wheezing or shortness of breath.  02/16/17  Yes [name], MD  AMBULATORY NON FORMULARY MEDICATION Medication Name: nebulizer and supplies DX: J96.11/J44.9 HEN:IDPOE 09/15/17  Yes [name], MD  carvedilol (COREG) 12.5 MG tablet Take 12.5 mg by mouth 2 (two) times daily with a meal.   Yes [provider]  clonazePAM (KLONOPIN) 1 MG tablet Take 1 tablet (1 mg total) by mouth 2 (two) times daily. 10/21/17  Yes Karen Kitchens, NP  gabapentin (NEURONTIN) 600 MG tablet Take 600 mg by mouth 3 (three) times daily.  05/25/12  Yes [provider]  guaiFENesin (MUCINEX) 600 MG 12 hr tablet Take 1,200 mg by mouth every 12 (twelve) hours as needed for cough or to loosen phlegm.   Yes [provider]  HYDROcodone-acetaminophen  (NORCO/VICODIN) 5-325 MG tablet Take 1 tablet by mouth every 4 (four) hours as needed. 11/10/17  Yes Verlon Au, NP  ipratropium-albuterol (DUONEB) 0.5-2.5 (3) MG/3ML SOLN Take 3 mLs by nebulization See admin instructions. Inhale 3ML via nebulizer twice daily as scheduled and inhale 3ML every 4 hours as needed for shortness of breath or wheezing   Yes [provider]  nitrofurantoin, macrocrystal-monohydrate, (MACROBID) 100 MG capsule Take 1 capsule (100 mg total) by mouth 2 (two) times daily. 11/14/17  Yes Earlie Server, MD  nitroGLYCERIN (NITROSTAT) 0.4 MG SL tablet Place 0.4 mg under the tongue every 5 (five) minutes as needed for chest pain.    Yes [provider]  promethazine (PHENERGAN) 25 MG tablet Take 25 mg by mouth every 6 (six) hours as needed for nausea.   Yes [provider]  zolpidem (AMBIEN) 5 MG tablet Take 5 mg by mouth at bedtime as needed for sleep.    Yes [provider]  fentaNYL (DURAGESIC - DOSED MCG/HR) 50 MCG/HR Place 1 patch (50 mcg total) onto the skin every 3 (three) days. 08/24/17   Karen Kitchens, NP  ondansetron (ZOFRAN) 8 MG tablet Take  1 tablet (8 mg total) by mouth 2 (two) times daily as needed. Start on the third day after chemotherapy. Patient taking differently: Take 8 mg by mouth 2 (two) times daily as needed for nausea or vomiting. Start on the third day after chemotherapy. 03/19/17   Lequita Asal, MD  SPIRIVA RESPIMAT 2.5 MCG/ACT AERS Inhale 2.5 mcg into the lungs 2 (two) times daily. 09/10/17   [name], MD     Allergies Contrast media [iodinated diagnostic agents]; Sulfa antibiotics; Tetracycline; White petrolatum; Amoxicillin-pot clavulanate; and Tape  Family History  Problem Relation Age of Onset  . Diabetes Father   . Stroke Father   . Heart attack Father   . CAD Sister     Social History Social History   Tobacco Use  . Smoking status: Current Every Day Smoker    Packs/day: 1.00    Years:  40.00    Pack years: 40.00    Types: Cigarettes  . Smokeless tobacco: Never Used  Substance Use Topics  . Alcohol use: No  . Drug use: No    Review of Systems  Constitutional: No fever/chills Eyes: No visual changes.  ENT: No sore throat. Cardiovascular: as above Respiratory: breathing is at baseline Gastrointestinal: No abdominal pain.  No nausea, no vomiting.   Genitourinary: +dysuria Musculoskeletal: chronic back pain Skin: Negative for rash. Neurological: Negative for headaches    ____________________________________________   PHYSICAL EXAM:  VITAL SIGNS: ED Triage Vitals [11/20/17 0646]  Enc Vitals Group     BP (!) 156/82     Pulse Rate 91     Resp 17     Temp 98 F (36.7 C)     Temp Source Oral     SpO2 91 %     Weight 46 kg (101 lb 6.6 oz)     Height      Head Circumference      Peak Flow      Pain Score 0     Pain Loc      Pain Edu?      Excl. in [place]?     Constitutional: Alert and oriented. No acute distress.   Nose: No congestion/rhinnorhea. Mouth/Throat: Mucous membranes are moist.    Cardiovascular: Normal rate, regular rhythm. Grossly normal heart sounds.  Good peripheral circulation. Port noted Respiratory: mildly increased resp effort, no tachypnea, scattered wheezing Gastrointestinal: Soft and nontender. No distention.  No CVA tenderness.  Musculoskeletal: No lower extremity tenderness nor edema.  Warm and well perfused Neurologic:  Normal speech and language. No gross focal neurologic deficits are appreciated.  Skin:  Skin is warm, dry and intact. No rash noted. Psychiatric: Mood and affect are normal. Speech and behavior are normal.  ____________________________________________   LABS (all labs ordered are listed, but only abnormal results are displayed)  Labs Reviewed  COMPREHENSIVE METABOLIC PANEL - Abnormal; Notable for the following components:      Result Value   Chloride 96 (*)    Glucose, Bld 105 (*)    Creatinine, Ser  1.16 (*)    GFR calc non Af Amer 45 (*)    GFR calc Af Amer 52 (*)    All other components within normal limits  CBC - Abnormal; Notable for the following components:   RBC 3.84 (*)    Hemoglobin 11.4 (*)    Platelets 148 (*)    All other components within normal limits  URINALYSIS, COMPLETE (UACMP) WITH MICROSCOPIC - Abnormal; Notable for the following components:  Color, Urine YELLOW (*)    APPearance CLEAR (*)    Hgb urine dipstick SMALL (*)    Leukocytes, UA TRACE (*)    Bacteria, UA RARE (*)    All other components within normal limits  BLOOD GAS, VENOUS - Abnormal; Notable for the following components:   pCO2, Ven 64 (*)    pO2, Ven 52.0 (*)    Bicarbonate 33.7 (*)    Acid-Base Excess 6.1 (*)    All other components within normal limits  URINE CULTURE  GLUCOSE, CAPILLARY  TROPONIN I   ____________________________________________  EKG  ED ECG REPORT I, Lavonia Drafts, the attending physician, personally viewed and interpreted this ECG.  Date: 11/20/2017  Rhythm: normal sinus rhythm QRS Axis: normal Intervals: normal ST/T Wave abnormalities: normal   ____________________________________________  RADIOLOGY  Chest x-ray unchanged from prior ____________________________________________   PROCEDURES  Procedure(s) performed: No  Procedures   Critical Care performed: No ____________________________________________   INITIAL IMPRESSION / ASSESSMENT AND PLAN / ED COURSE  Pertinent labs & imaging results that were available during my care of the patient were reviewed by me and considered in my medical decision making (see chart for details).  Patient p/w reports of increased cough. Also reports of confusion and apparently she is being treated for a UTI as well. Recent admission in September treated for hypercarbia and ESBL UTI. Hx of lung ca with known effusion.   Will obtain labs, cxr, urinalysis, with culture. Overall patient appears to be at her  baseline. Does not appear confused to me.   Patient's work-up is overall quite reassuring.  CO2 is unremarkable for her.  Urinalysis shows trace leukocytes but no white blood cells.  Discussed with patient's hospice coordinator as well.  No indication for admission at this time, appropriate for discharge    ____________________________________________   FINAL CLINICAL IMPRESSION(S) / ED DIAGNOSES  Final diagnoses:  Confusion        Note:  This document was prepared using Dragon voice recognition software and may include unintentional dictation errors.    Lavonia Drafts, MD 11/20/17 [phone]

## 2017-11-20 NOTE — Discharge Instructions (Addendum)
Patient's labs, xray, and urine tests were all reassuring.

## 2017-11-24 LAB — URINE CULTURE: CULTURE: NO GROWTH

## 2017-12-01 ENCOUNTER — Telehealth: Payer: Self-pay | Admitting: *Deleted

## 2017-12-01 NOTE — Telephone Encounter (Signed)
Hospice nurse Devana called reporting that patient has another UTI (third one since Hospice started caring for her) She has burning and frequency of urine. Please advise if they can do another UA/ Culture

## 2017-12-01 NOTE — Telephone Encounter (Signed)
Notified Devana per Atlee Abide, NP to go ahead and send UA with reflex to [facility]

## 2017-12-01 NOTE — Telephone Encounter (Signed)
Call returned to [facility], Will await further decision from Urology

## 2017-12-01 NOTE — Telephone Encounter (Addendum)
She keeps a (+) urine. She is colonized with ESBL producing organisms. Very hard to treat with PO coverage alone, as they fall under the MDRO category.   She has been seen by urology in the past. I am going to attach Zara Council, PA on this. Perhaps she has come recommendations. Only thing that I have got to "clear" her in the past was fosfomycin, however it has been almost impossible to get from her pharmacy the last couple of times.   If urology does not have any input before the end of the day today, we can proceed with UA with [facility] reflex and go from there. May have to loop in infectious disease for help.   Honor Loh, MSN, APRN, FNP-C, CEN Oncology/Hematology Nurse Practitioner  [facility] 12/01/17, 2:32 PM

## 2017-12-02 ENCOUNTER — Telehealth: Payer: Self-pay | Admitting: Urology

## 2017-12-02 NOTE — Telephone Encounter (Signed)
Per Honor Loh, NP -  UA and Culture ordered.  Patient has been set up for cystoscopy several time in the past and calls and cancels.  Patient would have to be sedated for procedure.  Patient now on hospice.

## 2017-12-02 NOTE — Telephone Encounter (Signed)
Mrs. Staiger was scheduled for a cystoscopy and it was cancelled due to illness.  We will need to get that rescheduled at some point.  An ID referral would be beneficial as we typically do not treat colonized bacteria.

## 2017-12-02 NOTE — Telephone Encounter (Signed)
[name] [name] was scheduled for a cystoscopy in July, but she was admitted to the hospital at that time.  We need to get her rescheduled.

## 2017-12-03 ENCOUNTER — Ambulatory Visit: Payer: Medicare HMO | Admitting: Radiation Oncology

## 2017-12-04 ENCOUNTER — Other Ambulatory Visit: Payer: Self-pay | Admitting: Urgent Care

## 2017-12-04 MED ORDER — FOSFOMYCIN TROMETHAMINE 3 G PO PACK: 3.0000 g | PACK | Freq: Once | ORAL | 0 refills | Status: AC

## 2017-12-04 NOTE — Progress Notes (Signed)
Re: Review of home health labs  Date: 12/04/17  Patient name: [name] [name]  Patient with symptomatic UTI. She is "in agony". Hospice nurse routinely follow patient. Patient with significant dysuria with (+) CVA tenderness. Her urine is malodorous. She is having intermittent periods of AMS, progressive debility, and poor fluid intake. No significant alterations in her VS noted by home hospice nurse. Patient has a history of recurrent complicated cystitis secondary to known MDRO.  Labs reviewed: UA (+) - 3+ LE, 1+ protein, nitrite (+), 1+ blood C&S reviewed as (+) for 50-100,000 CFU/ESBL producing E.coli.  Plans: 1. Complicated cystitis secondary to ESBL producing E.coli   Patient is essentially pan-resistant to all oral antibiotics, limiting her susceptibility to intravenous medications only.   I have consulted urology provider by phone.   Plans were for cystoscopy to assess for structural causes of recurrent UTI.   Terminal illness has prevented procedure. Patient now under hospice care and does not wish to pursue.   Contributing/exacerbating factors: age related vaginal atrophy, poor hygiene 2/2 debility, bacterial colonization with MDRO (+) organisms, poor fluid intake  Spoke with hospice (Crystal, RN) who advised that IV ABX could not be done.   Discussed remaining viable treatment options at this juncture  As part of her established GOC with hospice, patient wishes to remain in her home and NOT be admitted to the hospice.   Patient has recurrent UTIs.  The only medication that I have gotten her to respond to has been fosfomycin (Monurol)  Medication not covered on current drug formulary for hospice, however I was able to get administrative override due to circumstances.   Patient has pain medications for symptomatic relief already in place.   Pain well controlled per hospice nurse report. Exacerbates with micturition.   Could consider low dose pyridium, however it is  contraindicated in patient with CrCl </= 50 mL/min as it is renally eliminated and associated with higher incidence of ARF.  Hold off on pyridium at this point, as patient's CrCl is only 30 mL/min.   Disposition: Able to treat patient with oral coverage after speaking with administration about override. Rx sent to [facility] for Fosfomycin 3 gm dose x 1. Hospice encouraged to contact me if this is to effective in treating the patient's acute complicated cystitis in the outpatient setting. Advised that unfortunately, failed oral coverage will necessitate admission of for IV antibiotic coverage, which will likely continue for an extended period after discharge, as patient has a MDRO.   Honor Loh, MSN, APRN, FNP-C, CEN Oncology/Hematology Nurse Practitioner  [facility] 12/04/17, 4:44 PM

## 2017-12-04 NOTE — Telephone Encounter (Signed)
App has been rescheduled   Sharyn Lull

## 2017-12-08 ENCOUNTER — Other Ambulatory Visit: Payer: Self-pay | Admitting: Urgent Care

## 2017-12-08 ENCOUNTER — Telehealth: Payer: Self-pay | Admitting: *Deleted

## 2017-12-08 MED ORDER — METHYLPREDNISOLONE 4 MG PO TBPK: ORAL_TABLET | ORAL | 0 refills | Status: AC

## 2017-12-08 MED ORDER — AZITHROMYCIN 250 MG PO TABS: ORAL_TABLET | ORAL | 0 refills | Status: AC

## 2017-12-08 NOTE — Telephone Encounter (Signed)
She just completed a round of fosfomycin for UTI.  Multiple allergies to ABX.   Lets cover her with a course of azithromycin and a medrol dose pack. I will send the Rx to her pharmacy now.   Honor Loh, MSN, APRN, FNP-C, CEN Oncology/Hematology Nurse Practitioner  [facility] 12/08/17, 2:24 PM

## 2017-12-08 NOTE — Telephone Encounter (Signed)
Devana from Hospice called to report that patient is feeling "crappy" she is running fever, coughing up brown phlegm, has high pitched wheezing all lobes bilateral. Asking for an antibiotic to be ordered. Please advise

## 2017-12-08 NOTE — Telephone Encounter (Signed)
Devana informed

## 2017-12-11 IMAGING — CT CT HEAD W/O CM
3 series · 15 of 47 positions shown, 18 images · non-contrast
Comparison: None.

CLINICAL DATA: Initial evaluation for acute altered mental status.
Recent fall.

EXAM:
CT HEAD WITHOUT CONTRAST
TECHNIQUE: Contiguous axial images were obtained from the base of the skull
through the vertex without intravenous contrast.

[Series 2: head wo · axial · 0.43mm/px · z∈[+159,+284]mm · 9 of 30 slices shown, 12 images]
[im 3/30  brain]
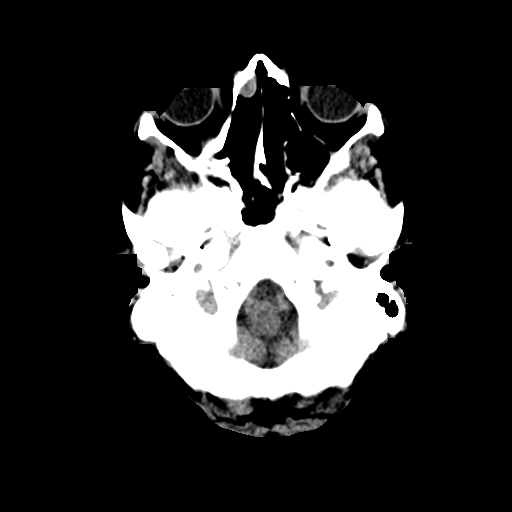
[im 3/30  bone]
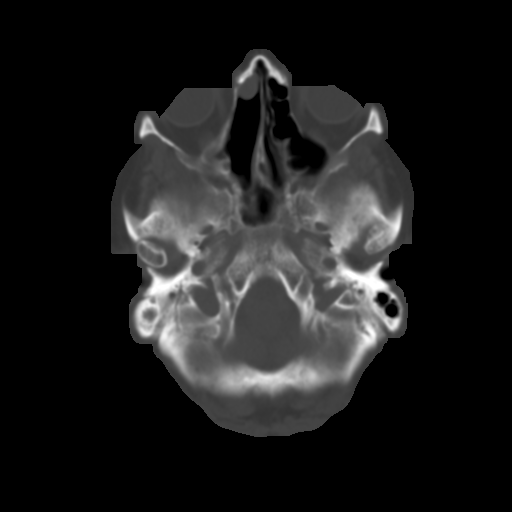
[im 6/30  brain]
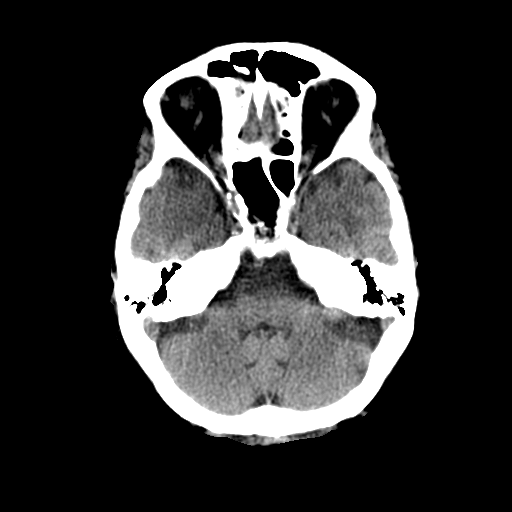
[im 9/30  brain]
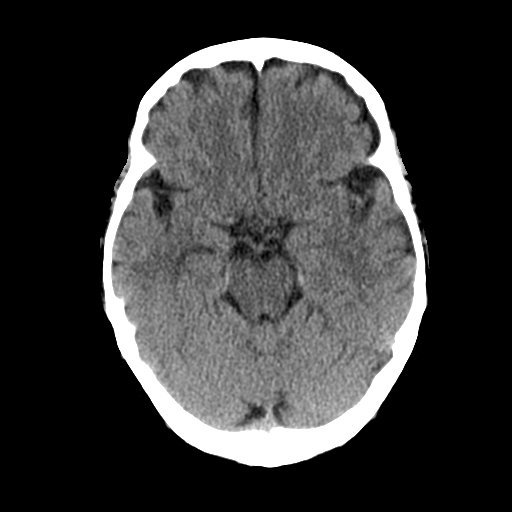
[im 12/30  brain]
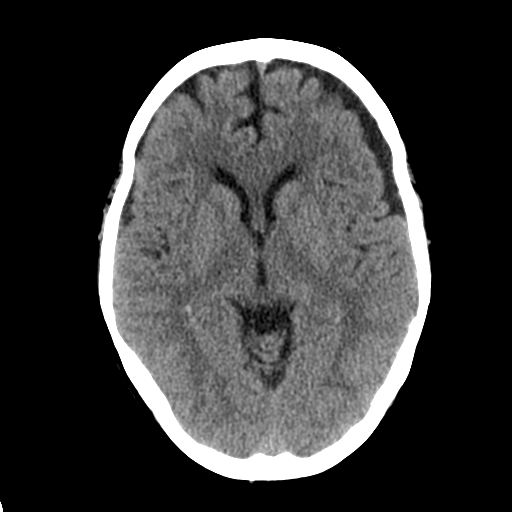
[im 16/30  brain]
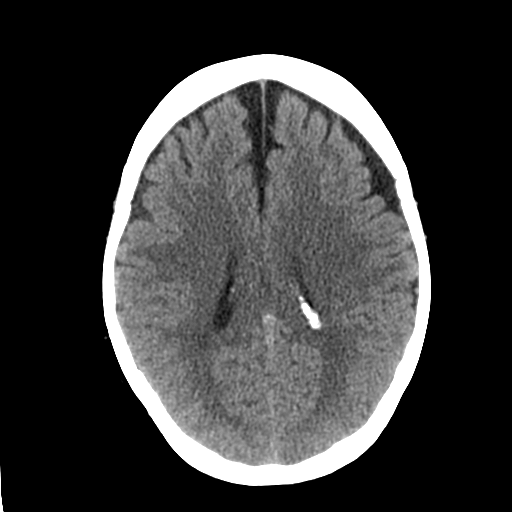
[im 16/30  bone]
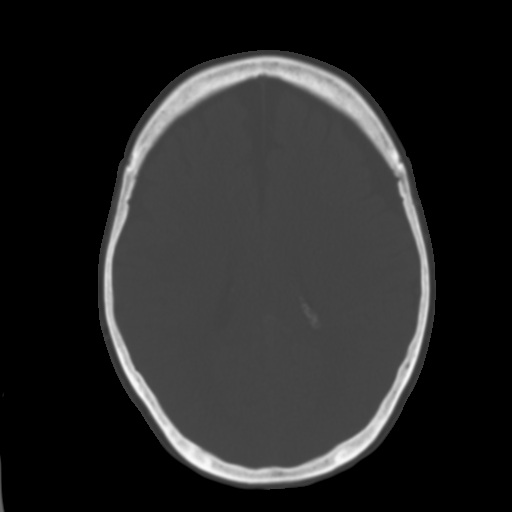
[im 19/30  brain]
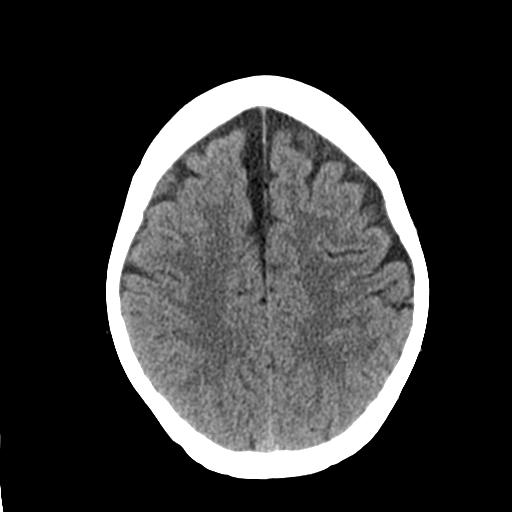
[im 22/30  brain]
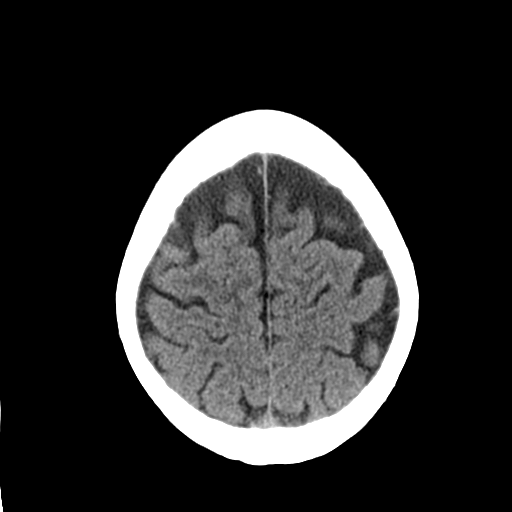
[im 25/30  brain]
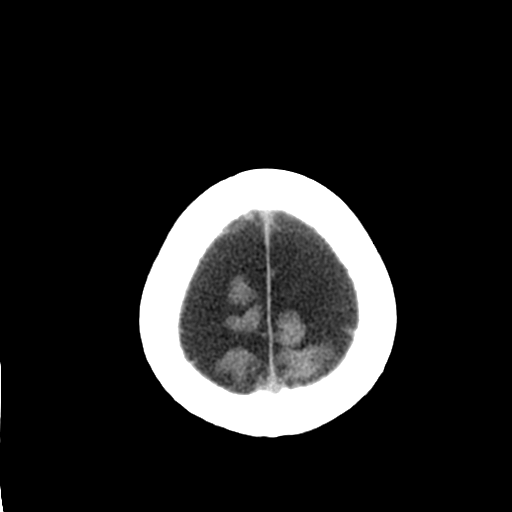
[im 28/30  brain]
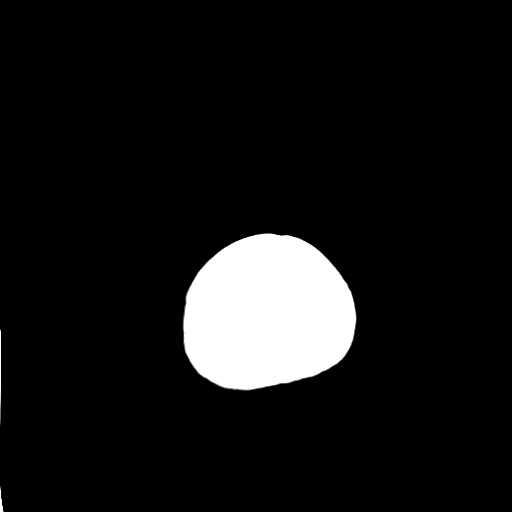
[im 28/30  bone]
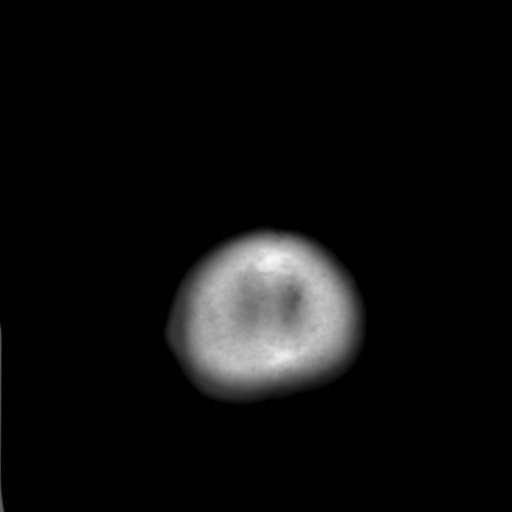

[Series 4: coronal soft tissue · coronal · 0.30mm/px · 3 of 63 slices shown]
[im 21/63  brain]
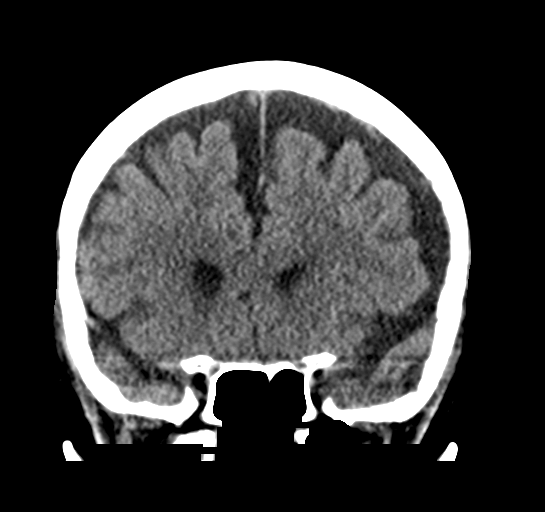
[im 28/63  brain]
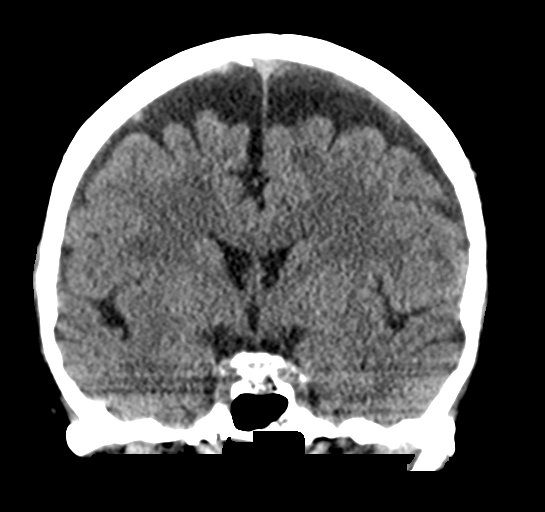
[im 35/63  brain]
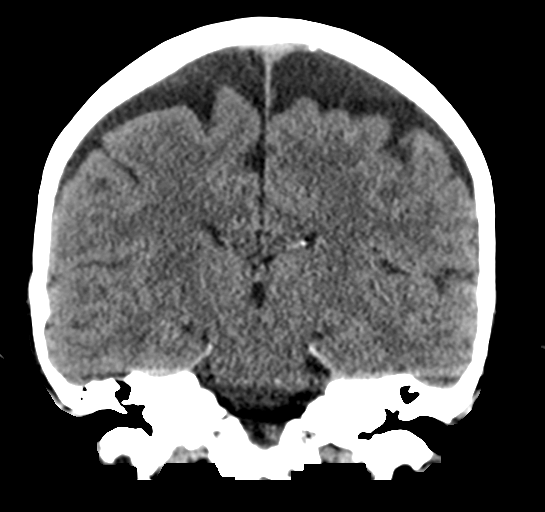

[Series 5: sagittal soft tissue · sagittal · 0.31mm/px · 3 of 48 slices shown]
[im 16/48  brain]
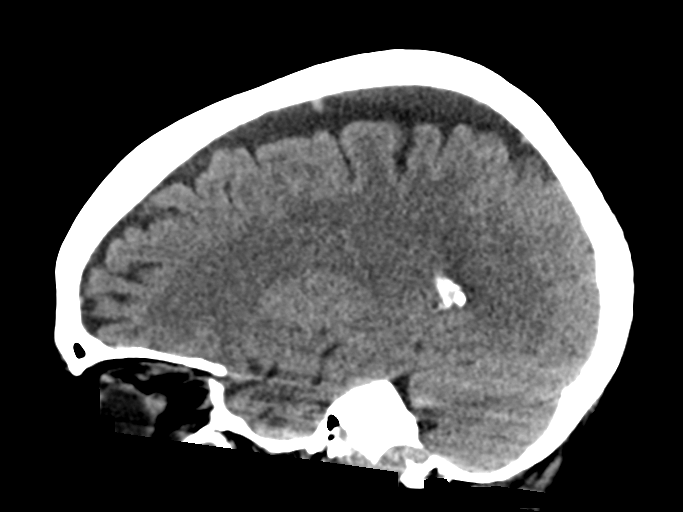
[im 24/48  brain]
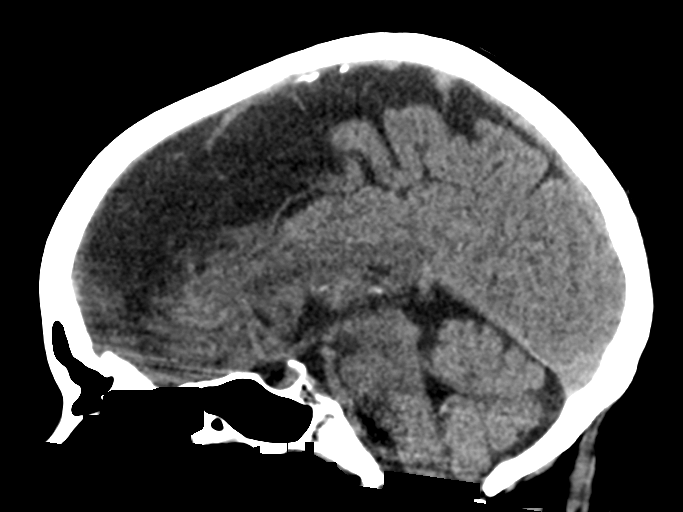
[im 32/48  brain]
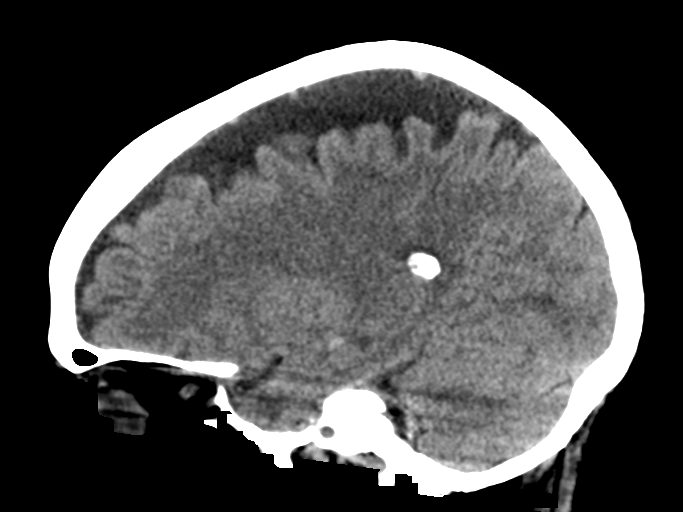

[15 of 47 positions shown; findings below may reference images not displayed]

FINDINGS: Brain: Generalized age-related cerebral atrophy. Mild chronic
microvascular ischemic disease. No acute intracranial hemorrhage. No
evidence for acute large vessel territory infarct. No mass lesion,
midline shift or mass effect. No extra-axial fluid collection. Mild
prominence of the extra-axial space overlying the bilateral frontal
convexities felt to be related atrophy. No hydrocephalus.

Vascular: No hyperdense vessel. Scattered vascular calcifications
noted within the carotid siphons.

Skull: Scalp soft tissues within normal limits.  Calvarium intact.

Sinuses/Orbits: Globes and orbital soft tissues within normal
limits. Sequela prior sinus surgery noted. Scattered mucosal
thickening within the right maxillary sinus, ethmoidal air cells,
and sphenoid sinuses. Trace bilateral mastoid effusions.
IMPRESSION: 1. No acute intracranial process identified.
2. Age-related cerebral atrophy with mild chronic microvascular
ischemic disease.
3. Chronic paranasal sinus disease with sequela prior sinonasal
surgery.

## 2017-12-11 IMAGING — CT CT CHEST W/O CM
2 of 3 series · 15 of 36 positions shown, 18 images · non-contrast
Comparison: Chest x-ray 11/14/2015, CT chest 10/03/2015

CLINICAL DATA: Altered mental status, fall 6 with congestion
hypoxia decreased O2 sats

EXAM:
CT CHEST WITHOUT CONTRAST
TECHNIQUE: Multidetector CT imaging of the chest was performed following the
standard protocol without IV contrast.

[Series 2: thorax · axial · 0.64mm/px · z∈[-198,+28]mm · 12 of 133 slices shown, 15 images]
[im 10/133  mediastinal]
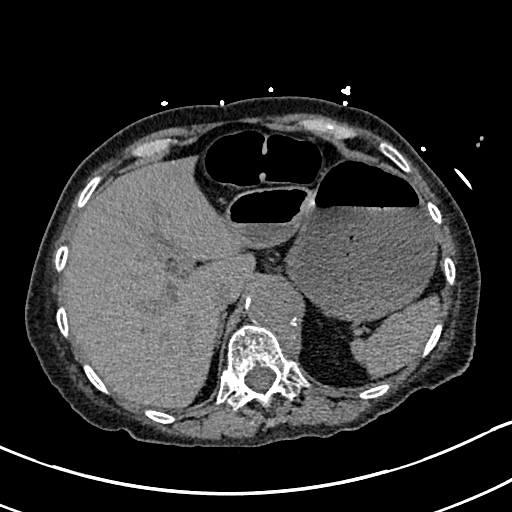
[im 10/133  lung]
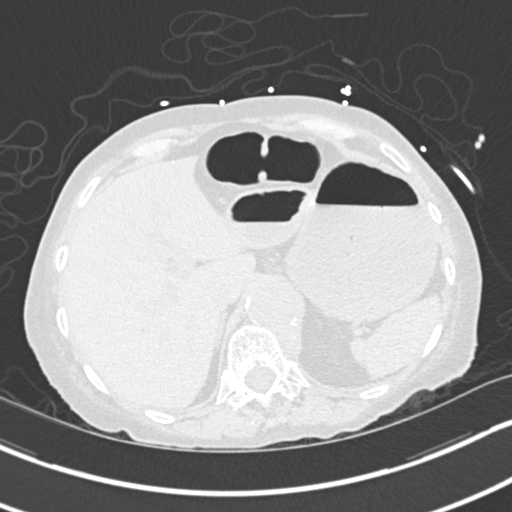
[im 20/133  lung]
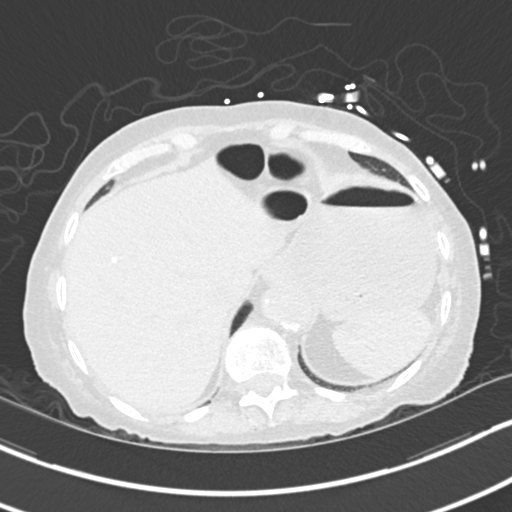
[im 30/133  lung]
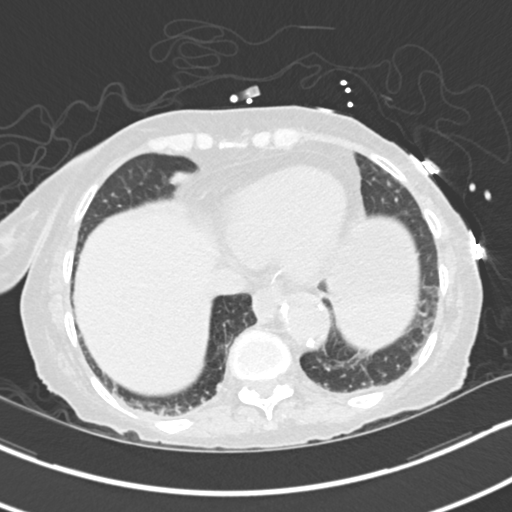
[im 40/133  lung]
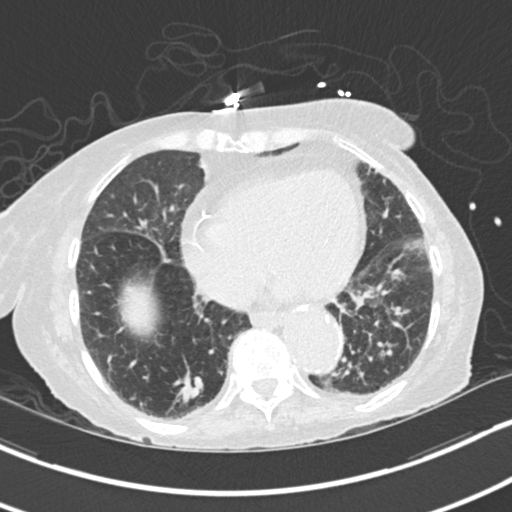
[im 49/133  mediastinal]
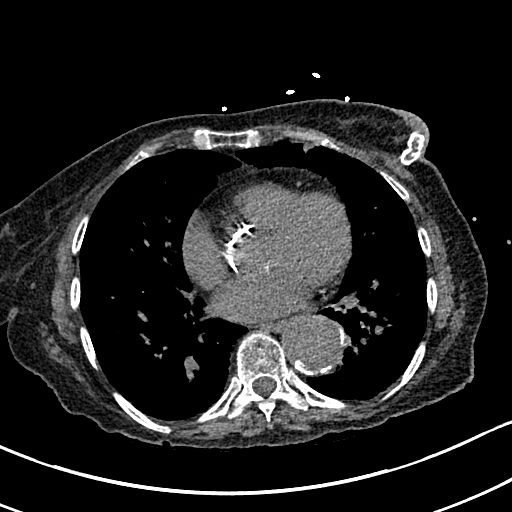
[im 49/133  lung]
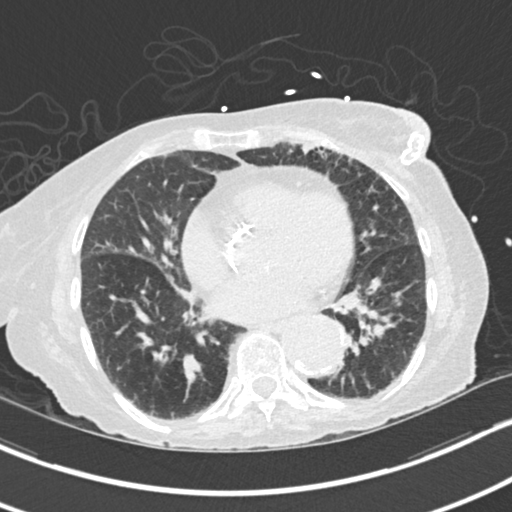
[im 59/133  lung]
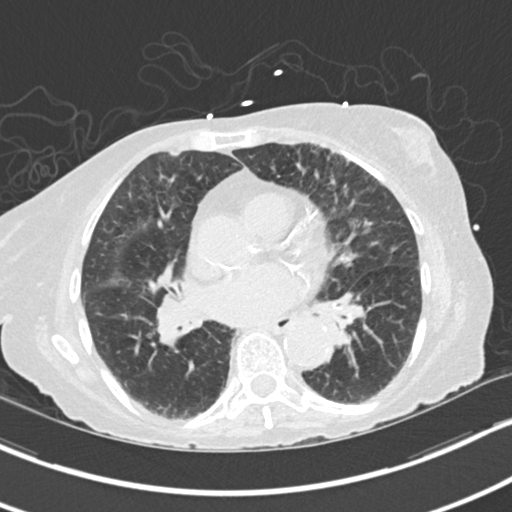
[im 74/133  lung]
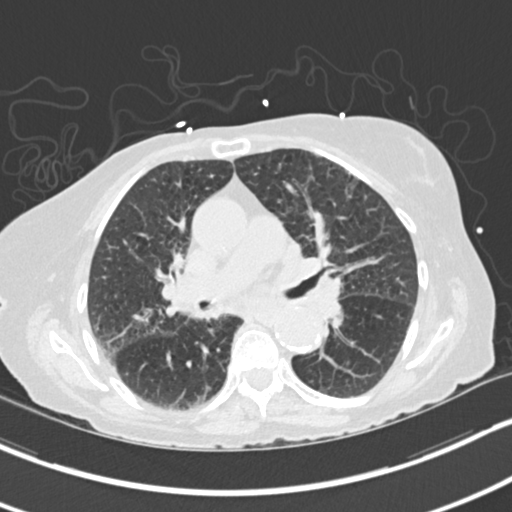
[im 84/133  lung]
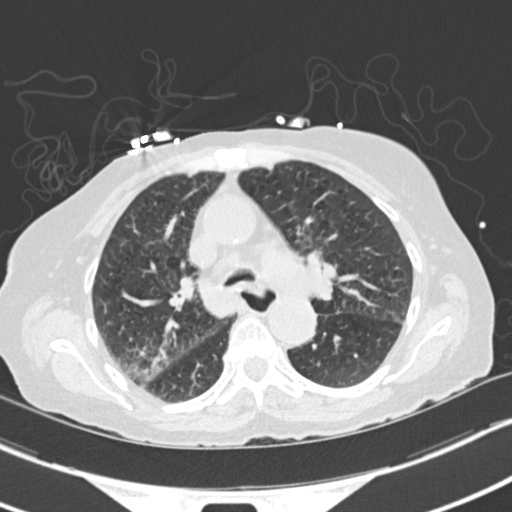
[im 93/133  mediastinal]
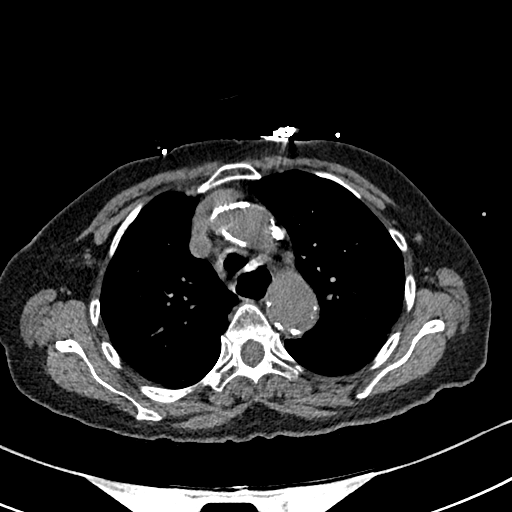
[im 93/133  lung]
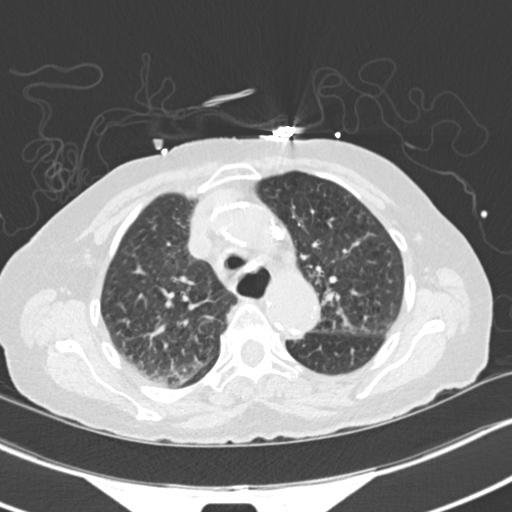
[im 103/133  lung]
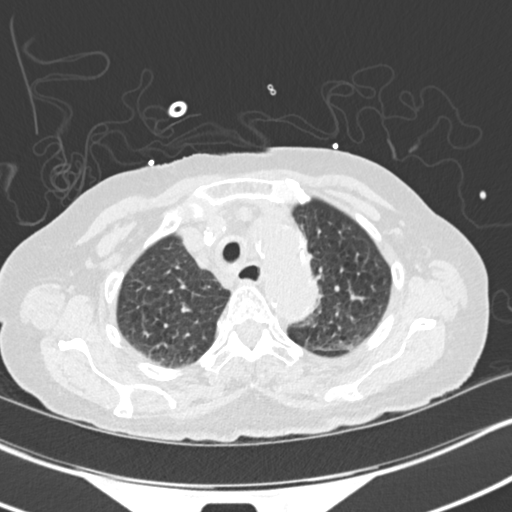
[im 113/133  lung]
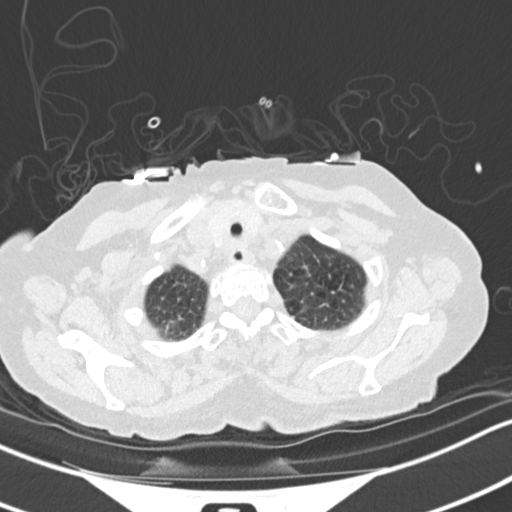
[im 123/133  lung]
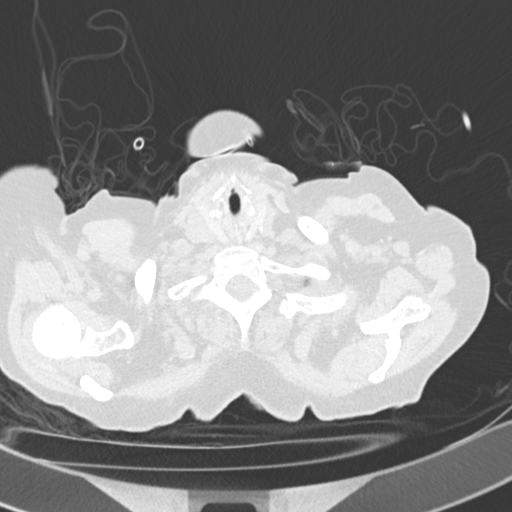

[Series 5: coronal · coronal · 0.55mm/px · 3 of 111 slices shown]
[im 23/111  lung]
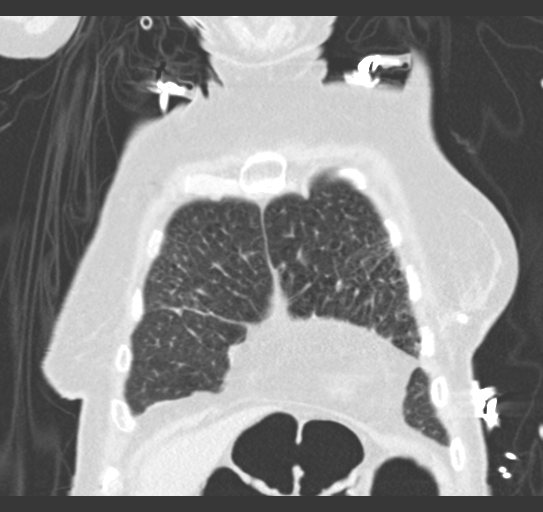
[im 45/111  lung]
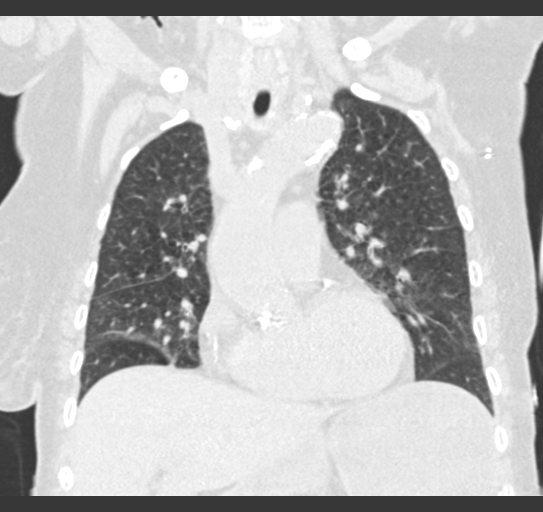
[im 67/111  lung]
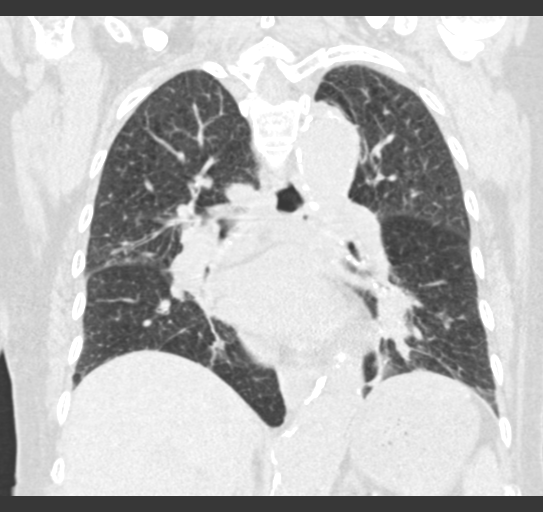

[15 of 36 positions shown; findings below may reference images not displayed]

FINDINGS: Cardiovascular: Limited without intravenous contrast. Densely
calcified thoracic aorta. Ectasia of the ascending aorta, measuring
up to 3.3 Cm. Focal aneurysmal dilatation of the descending thoracic
aorta, measuring up to 4.2 cm, 4.2 cm previously. No gross
mediastinal hematoma. Extensive coronary artery calcifications.
Aortic valvular calcifications. Mitral valvular calcifications.
Heart size upper normal. No gross diffusion.

Mediastinum/Nodes: No pathologically enlarged mediastinal or
axillary nodes. Trachea and mainstem bronchi appear within normal
limits. Imaged thyroid gland unremarkable. Mild air distention of
the upper esophagus.

Lungs/Pleura: Moderate diffuse bilateral emphysematous changes. Mild
interstitial thickening within the upper lobes with mild hazy
opacity in the posterior right upper lobe since the prior study.
Scattered areas of mild ground-glass density within the bilateral
upper lobes. Interim finding of soft tissue thickening around the
central airways with central airspace disease. No nodules. No
effusion.

Upper Abdomen: Upper abdominal images demonstrate a few dilated
fluid-filled loops of bowel.

Musculoskeletal: Rim calcification under the left breast.
Degenerative changes. No acute osseous abnormality.
IMPRESSION: 1. Mild to moderate diffuse emphysematous changes bilaterally.
Interim finding of scattered areas of mild ground-glass density
within the bilateral upper lobes suggesting foci of pneumonitis.
Soft tissue thickening around the central airways with central
airspace disease, suggests central airways inflammation and possible
pneumonia. No effusion.
2. Ectasia of the ascending aorta with focal aneurysmal dilatation
of the descending thoracic aorta as previously described.

## 2017-12-18 ENCOUNTER — Other Ambulatory Visit: Payer: Self-pay | Admitting: *Deleted

## 2017-12-18 MED ORDER — HYDROCODONE-ACETAMINOPHEN 5-325 MG PO TABS: 1.0000 | ORAL_TABLET | ORAL | 0 refills | Status: DC | PRN

## 2017-12-18 NOTE — Telephone Encounter (Signed)
Patient has also been using Tramadol which was been discontinued form her medicine list and requesting a refill of Tramadol also. Please advise

## 2017-12-18 NOTE — Telephone Encounter (Signed)
Patient called cancer center requesting refill of Norco 5-325 mg q 4 PRN.   As mandated by the [place] STOP Act (Strengthen Opioid Misuse Prevention), the [place] Controlled Substance Reporting System ([place]) was reviewed for this patient.  Below is the past 75-months of controlled substance prescriptions as displayed by the registry.  I have personally consulted with my supervising physician, Dr. Rogue Bussing, who agrees that continuation of opiate therapy is medically appropriate at this time and agrees to provide continual monitoring, including urine/blood drug screens, as indicated. Refill is appropriate on or after 11/24/17.  NCCSRS reviewed:     Faythe Casa, NP 12/18/2017 3:36 PM [phone]

## 2017-12-23 ENCOUNTER — Encounter: Payer: Self-pay | Admitting: Urology

## 2017-12-23 ENCOUNTER — Telehealth: Payer: Self-pay | Admitting: Urology

## 2017-12-23 ENCOUNTER — Other Ambulatory Visit: Payer: Self-pay | Admitting: Urology

## 2017-12-23 NOTE — Progress Notes (Incomplete)
° °  12/23/2017  CC: No chief complaint on file.   HPI: [name] [name] is a 77 yo F with a history of microscopic hematuria, SUI, vaginal atrophy and asymptomatic bacteruria returns today for a cystoscopy. Her last visit with [place] was on 06/11/2017 with Zara Council PA-C.   There were no vitals taken for this visit. NED. A&Ox3.   No respiratory distress   Abd soft, NT, ND Normal external genitalia with patent urethral meatus  Cystoscopy Procedure Note  Patient identification was confirmed, informed consent was obtained, and patient was prepped using Betadine solution.  Lidocaine jelly was administered per urethral meatus.    Procedure: - Flexible cystoscope introduced, without any difficulty.   - Thorough search of the bladder revealed:    normal urethral meatus    normal urothelium    no stones    no ulcers     no tumors    no urethral polyps    no trabeculation  - Ureteral orifices were normal in position and appearance.  Post-Procedure: - Patient tolerated the procedure well  Assessment/ Plan: 1. Microscopic Hematuria  -***   No follow-ups on file.  Dolores Frame, am acting as a Education administrator for Dr. Nicki Reaper C. Stoioff,  {Add Media planner

## 2017-12-23 NOTE — Telephone Encounter (Signed)
Pt was a no show for cysto.  Just F.Y.I.

## 2017-12-24 ENCOUNTER — Telehealth: Payer: Self-pay | Admitting: *Deleted

## 2017-12-24 NOTE — Telephone Encounter (Signed)
Asking if patient can increase her Clonopin to twice a day from daily.I advised [name] [name] that we have it in our system for twice a day already

## 2018-01-07 ENCOUNTER — Other Ambulatory Visit: Payer: Self-pay | Admitting: *Deleted

## 2018-01-08 MED ORDER — HYDROCODONE-ACETAMINOPHEN 5-325 MG PO TABS: 1.0000 | ORAL_TABLET | ORAL | 0 refills | Status: DC | PRN

## 2018-01-19 ENCOUNTER — Other Ambulatory Visit: Payer: Self-pay | Admitting: *Deleted

## 2018-01-19 ENCOUNTER — Encounter: Payer: Self-pay | Admitting: Urology

## 2018-01-20 MED ORDER — HYDROCODONE-ACETAMINOPHEN 5-325 MG PO TABS: 1.0000 | ORAL_TABLET | ORAL | 0 refills | Status: AC | PRN

## 2018-01-20 NOTE — Telephone Encounter (Signed)
Patient called cancer center requesting refill of Norco 5-325 mg q 4 PRN for pain.   As mandated by the [place] STOP Act (Strengthen Opioid Misuse Prevention), the [place] Controlled Substance Reporting System ([place]) was reviewed for this patient.  Below is the past 28-months of controlled substance prescriptions as displayed by the registry.  I have personally consulted with my supervising physician, Dr. Mike Gip, who agrees that continuation of opiate therapy is medically appropriate at this time and agrees to provide continual monitoring, including urine/blood drug screens, as indicated. Refill is appropriate on or after 01/22/18.  NCCSRS reviewed:     Faythe Casa, NP 01/20/2018 8:56 AM [phone]

## 2018-01-21 ENCOUNTER — Telehealth: Payer: Self-pay | Admitting: *Deleted

## 2018-01-21 NOTE — Telephone Encounter (Signed)
No. I have not seen a urine culture. I don't see any documentation regarding a urine being ordered. Perhaps urology ordered it, or may Dr. Ginette Pitman?

## 2018-01-21 NOTE — Telephone Encounter (Signed)
H\ospce nurse did not call for order, she just collected and sent sample on patient. And I\t was to be faxed this morning. I asked that she fax a copy to me and she took my fax #

## 2018-01-21 NOTE — Telephone Encounter (Signed)
Hospice called asking if we have seen the results of the Urine culture and states patient is anxious to start antibiotics. Please advise

## 2018-01-25 ENCOUNTER — Other Ambulatory Visit: Payer: Self-pay

## 2018-01-25 ENCOUNTER — Emergency Department

## 2018-01-25 ENCOUNTER — Emergency Department
Admission: EM | Admit: 2018-01-25 | Discharge: 2018-01-25 | Disposition: A | Discharge status: Home / Self Care | Attending: Emergency Medicine | Admitting: Emergency Medicine

## 2018-01-25 DIAGNOSIS — F17210 Nicotine dependence, cigarettes, uncomplicated: Secondary | ICD-10-CM | POA: Diagnosis not present

## 2018-01-25 DIAGNOSIS — S0990XA Unspecified injury of head, initial encounter: Secondary | ICD-10-CM | POA: Diagnosis not present

## 2018-01-25 DIAGNOSIS — I110 Hypertensive heart disease with heart failure: Secondary | ICD-10-CM | POA: Diagnosis not present

## 2018-01-25 DIAGNOSIS — J449 Chronic obstructive pulmonary disease, unspecified: Secondary | ICD-10-CM | POA: Diagnosis not present

## 2018-01-25 DIAGNOSIS — Y939 Activity, unspecified: Secondary | ICD-10-CM | POA: Diagnosis not present

## 2018-01-25 DIAGNOSIS — S0083XA Contusion of other part of head, initial encounter: Secondary | ICD-10-CM | POA: Diagnosis not present

## 2018-01-25 DIAGNOSIS — R0902 Hypoxemia: Secondary | ICD-10-CM | POA: Diagnosis not present

## 2018-01-25 DIAGNOSIS — R5381 Other malaise: Secondary | ICD-10-CM | POA: Diagnosis not present

## 2018-01-25 DIAGNOSIS — Z743 Need for continuous supervision: Secondary | ICD-10-CM | POA: Diagnosis not present

## 2018-01-25 DIAGNOSIS — R0689 Other abnormalities of breathing: Secondary | ICD-10-CM | POA: Diagnosis not present

## 2018-01-25 DIAGNOSIS — R0602 Shortness of breath: Secondary | ICD-10-CM | POA: Diagnosis not present

## 2018-01-25 DIAGNOSIS — I2510 Atherosclerotic heart disease of native coronary artery without angina pectoris: Secondary | ICD-10-CM | POA: Diagnosis not present

## 2018-01-25 DIAGNOSIS — Y929 Unspecified place or not applicable: Secondary | ICD-10-CM | POA: Diagnosis not present

## 2018-01-25 DIAGNOSIS — I5022 Chronic systolic (congestive) heart failure: Secondary | ICD-10-CM | POA: Insufficient documentation

## 2018-01-25 DIAGNOSIS — D0590 Unspecified type of carcinoma in situ of unspecified breast: Secondary | ICD-10-CM | POA: Insufficient documentation

## 2018-01-25 DIAGNOSIS — W06XXXA Fall from bed, initial encounter: Secondary | ICD-10-CM | POA: Insufficient documentation

## 2018-01-25 DIAGNOSIS — Y999 Unspecified external cause status: Secondary | ICD-10-CM | POA: Diagnosis not present

## 2018-01-25 DIAGNOSIS — R279 Unspecified lack of coordination: Secondary | ICD-10-CM | POA: Diagnosis not present

## 2018-01-25 DIAGNOSIS — W19XXXA Unspecified fall, initial encounter: Secondary | ICD-10-CM | POA: Diagnosis not present

## 2018-01-25 LAB — COMPREHENSIVE METABOLIC PANEL
ALK PHOS: 82 U/L (ref 38–126)
ALT: 8 U/L (ref 0–44)
ANION GAP: 7 (ref 5–15)
AST: 15 U/L (ref 15–41)
Albumin: 3 g/dL — ABNORMAL LOW (ref 3.5–5.0)
BILIRUBIN TOTAL: 0.5 mg/dL (ref 0.3–1.2)
BUN: 16 mg/dL (ref 8–23)
CALCIUM: 8.4 mg/dL — AB (ref 8.9–10.3)
CO2: 32 mmol/L (ref 22–32)
Chloride: 97 mmol/L — ABNORMAL LOW (ref 98–111)
Creatinine, Ser: 1.1 mg/dL — ABNORMAL HIGH (ref 0.44–1.00)
GFR calc Af Amer: 57 mL/min — ABNORMAL LOW (ref >60)
GFR, EST NON AFRICAN AMERICAN: 49 mL/min — AB (ref >60)
Glucose, Bld: 109 mg/dL — ABNORMAL HIGH (ref 70–99)
POTASSIUM: 4 mmol/L (ref 3.5–5.1)
Sodium: 136 mmol/L (ref 135–145)
TOTAL PROTEIN: 6.2 g/dL — AB (ref 6.5–8.1)

## 2018-01-25 LAB — CBC
HCT: 31.3 % — ABNORMAL LOW (ref 36.0–46.0)
Hemoglobin: 9.2 g/dL — ABNORMAL LOW (ref 12.0–15.0)
MCH: 28.9 pg (ref 26.0–34.0)
MCHC: 29.4 g/dL — ABNORMAL LOW (ref 30.0–36.0)
MCV: 98.4 fL (ref 80.0–100.0)
Platelets: 123 10*3/uL — ABNORMAL LOW (ref 150–400)
RBC: 3.18 MIL/uL — ABNORMAL LOW (ref 3.87–5.11)
RDW: 14.8 % (ref 11.5–15.5)
WBC: 6.5 10*3/uL (ref 4.0–10.5)
nRBC: 0 % (ref 0.0–0.2)

## 2018-01-25 LAB — TROPONIN I: Troponin I: <0.03 ng/mL (ref <0.03)

## 2018-01-25 MED ORDER — SODIUM CHLORIDE 0.9 % IV BOLUS
1000.0000 mL | Freq: Once | INTRAVENOUS | Status: AC
  Administered 2018-01-25: 1000 mL via INTRAVENOUS

## 2018-01-25 MED ORDER — ACETAMINOPHEN 325 MG PO TABS
650.0000 mg | ORAL_TABLET | Freq: Once | ORAL | Status: AC
  Administered 2018-01-25: 650 mg via ORAL
  Filled 2018-01-25: qty 2

## 2018-01-25 NOTE — ED Provider Notes (Addendum)
[facility] Emergency Department Provider Note  ____________________________________________  Time seen: Approximately 11:54 AM  I have reviewed the triage vital signs and the nursing notes.   HISTORY  Chief Complaint Fall    HPI [name] [name] is a 78 y.o. female with lung cancer, not currently under treatment, who wears 5 L of O2 at home at baseline, not anticoagulated, presenting after falling out of the bed.  The patient reports she did not want to come to the hospital but her son made her come.  She states she slipped out of the bed, and landed between the nightstand in her bed.  She does have some evidence of bruising on the face.  She states she does not remember the entire fall, so it is unclear whether she had loss of consciousness.  At this time, the patient denies any neck pain, back pain, chest pain, or new shortness of breath.  Past Medical History:  Diagnosis Date  . Asthma   . Breast cancer ([place]) 2009   left  . Cancer ([place])   . CHF (congestive heart failure) ([place])   . CHF (congestive heart failure) ([place])   . Chronic UTI   . COPD (chronic obstructive pulmonary disease) ([place])   . Dizziness   . Fibromyalgia   . Hypertension   . Neuropathy   . Personal history of tobacco use, presenting hazards to health 05/17/2015  . Polyp, larynx   . RA (rheumatoid arthritis) ([place])   . Sinus infection    recent  . Stumbling gait    to the left  . Supplemental oxygen dependent    2.5l    Patient Active Problem List   Diagnosis Date Noted  . Cancer-related pain 11/15/2017  . Protein-calorie malnutrition, severe 09/18/2017  . COPD with acute exacerbation ([place]) 09/17/2017  . Neoplastic malignant related fatigue 08/20/2017  . Acute respiratory failure ([place]) 07/17/2017  . Encephalopathy acute 06/30/2017  . UTI due to extended-spectrum beta lactamase (ESBL) producing Escherichia coli 05/21/2017  . Acute midline thoracic back pain 05/09/2017  .  Compression fracture of T7 vertebra (H[facility]) 05/08/2017  . Rib pain on right side 05/08/2017  . Encounter for antineoplastic immunotherapy 05/08/2017  . B12 deficiency 04/29/2017  . Anemia 04/11/2017  . History of recurrent UTIs 04/11/2017  . Diarrhea 03/28/2017  . Rib lesion 03/03/2017  . Cancer ([place]) 02/16/2017  . Goals of care, counseling/discussion 02/02/2017  . Collapse of left lung 02/02/2017  . Pleural effusion, left 02/02/2017  . HCAP (healthcare-associated pneumonia) 11/04/2016  . Recurrent UTI 09/08/2016  . Lung mass 09/08/2016  . Encounter for antineoplastic chemotherapy 07/24/2016  . Primary cancer of left lower lobe of lung ([place]) 06/25/2016  . Mixed hyperlipidemia 06/19/2016  . Peripheral neuropathy 06/19/2016  . Aneurysm of thoracic aorta ([place]) 04/29/2016  . Pain in limb 04/29/2016  . Acute delirium 03/06/2016  . Acute renal insufficiency 03/06/2016  . Pressure injury of skin 03/05/2016  . UTI (urinary tract infection) 03/04/2016  . Acute renal failure (ARF) ([place]) 12/04/2015  . Protein malnutrition ([place]) 11/24/2015  . Acute respiratory failure with hypoxia ([place])   . Hyponatremia   . Chronic systolic CHF (congestive heart failure) ([place]) 11/15/2015  . Sepsis ([place]) 11/15/2015  . Descending thoracic aortic aneurysm ([place]) 09/11/2015  . Personal history of tobacco use, presenting hazards to health 05/17/2015  . COPD, moderate ([place]) 05/19/2014  . Abnormal cardiovascular stress test 03/10/2014  . CAD (coronary artery disease) 02/25/2014  . Aneurysm of abdominal vessel ([place])  02/24/2014  . Shortness of breath 02/24/2014  . Vocal cord polyp 02/13/2014  . Narcotic drug use 01/17/2013  . Fibromyalgia 05/11/2012  . Low back pain 05/11/2012  . Arthritis 11/03/2011  . Asthma 11/03/2011  . Breast cancer ([place]) 11/03/2011  . Osteoarthritis 06/17/2011    Past Surgical History:  Procedure Laterality Date  . ABDOMINAL HYSTERECTOMY    . BREAST LUMPECTOMY Left 2009   chemo and  radiation  . CYST EXCISION Left 02/27/2015   Procedure: CYST REMOVAL;  Surgeon: Hessie Knows, MD;  Location: [facility] ORS;  Service: Orthopedics;  Laterality: Left;  . EYE MUSCLE SURGERY Right    13 surgeries  . FLEXIBLE BRONCHOSCOPY N/A 07/01/2016   Procedure: FLEXIBLE BRONCHOSCOPY;  Surgeon: Wilhelmina Mcardle, MD;  Location: [facility] ORS;  Service: Pulmonary;  Laterality: N/A;  . FLEXIBLE BRONCHOSCOPY N/A 02/09/2017   Procedure: FLEXIBLE BRONCHOSCOPY;  Surgeon: [name], MD;  Location: [facility] ORS;  Service: Pulmonary;  Laterality: N/A;  . KYPHOPLASTY N/A 05/22/2017   Procedure: Celine Ahr;  Surgeon: Hessie Knows, MD;  Location: [facility] ORS;  Service: Orthopedics;  Laterality: N/A;  . PORTA CATH INSERTION N/A 03/04/2017   Procedure: PORTA CATH INSERTION;  Surgeon: [name], MD;  Location: [facility];  Service: Cardiovascular;  Laterality: N/A;  . THUMB ARTHROSCOPY Left     Current Outpatient Rx  . Order #: [id number] Class: Normal  . Order #: [id number] Class: Print  . Order #: [id number] Class: Normal  . Order #: [id number] Class: Historical Med  . Order #: [id number] Class: Normal  . Order #: [id number] Class: Normal  . Order #: [id number] Class: Historical Med  . Order #: [id number] Class: Historical Med  . Order #: [id number] Class: Normal  . Order #: [id number] Class: Historical Med  . Order #: [id number] Class: Normal  . Order #: [id number] Class: Normal  . Order #: [id number] Class: Historical Med  . Order #: [id number] Class: Normal  . Order #: [id number] Class: Historical Med  . Order #: [id number] Class: Normal  . Order #: [id number] Class: Historical Med    Allergies Contrast media [iodinated diagnostic agents]; Sulfa antibiotics; Tetracycline; White petrolatum; Amoxicillin-pot clavulanate; and Tape  Family History  Problem Relation Age of Onset  . Diabetes Father   . Stroke Father   . Heart attack Father   . CAD Sister     Social History Social History   Tobacco Use  .  Smoking status: Current Every Day Smoker    Packs/day: 1.00    Years: 40.00    Pack years: 40.00    Types: Cigarettes  . Smokeless tobacco: Never Used  Substance Use Topics  . Alcohol use: No  . Drug use: No    Review of Systems Constitutional: No fever/chills.  Positive chronic illness. Eyes: No visual changes.  No blurred or double vision. ENT: No sore throat. No congestion or rhinorrhea.  Positive facial injury. Cardiovascular: Denies chest pain. Denies palpitations. Respiratory: Unchanged shortness of breath.  Chronic unchanged cough. Gastrointestinal: No abdominal pain.  No nausea, no vomiting.  No diarrhea.  No constipation. Genitourinary: Negative for dysuria. Musculoskeletal: Negative for back pain.  No neck pain.  Skin: Negative for rash. Neurological: Negative for headaches. No focal numbness, tingling or weakness.     ____________________________________________   PHYSICAL EXAM:  VITAL SIGNS: ED Triage Vitals  Enc Vitals Group     BP 01/25/18 1024 108/62     Pulse Rate 01/25/18 1024 95     Resp 01/25/18 1024 18     Temp 01/25/18 1024 98.4 F (36.9 C)  Temp Source 01/25/18 1024 Oral     SpO2 01/25/18 1024 93 %     Weight 01/25/18 1021 74 lb (33.6 kg)     Height 01/25/18 1021 _0  (1.575 m)     Head Circumference --      Peak Flow --      Pain Score 01/25/18 1020 10     Pain Loc --      Pain Edu? --      Excl. in [place]? --     Constitutional: Alert and oriented.  Answers questions appropriately.  Chronically ill-appearing.  GCS is 15. Eyes: Conjunctivae are normal.  EOMI. PERRLA.  No raccoon eyes.  No scleral icterus. Head: The patient has bruising over the forehead and the nose.  No battle sign. Nose: No congestion/rhinnorhea.  Bruising over the nose without any septal hematoma. Mouth/Throat: Mucous membranes are dry.  No malocclusion or dental injury.  Neck: No stridor.  Supple.  No midline C-spine tenderness to palpation, step-offs or deformities.   The patient has full range of motion without pain. Cardiovascular: Normal rate, regular rhythm. No murmurs, rubs or gallops.  Respiratory: Patient is maintaining oxygen saturations of greater than 97% on 6 L nasal cannula.  She has a prolonged expiratory phase and rales in the left lung base.  She has end expiratory wheezing.  She is able to speak in full sentences without difficulty. Gastrointestinal: Soft, nontender and nondistended.  No guarding or rebound.  No peritoneal signs. Musculoskeletal: Pelvis is stable.  Full range of motion of the bilateral hips, knees and ankles without pain.  No LE edema. No ttp in the calves or palpable cords.  Negative Homan's sign. Neurologic:  A&Ox3.  Speech is clear.  Face and smile are symmetric.  EOMI.  Moves all extremities well. Skin:  Skin is warm, dry and intact. No rash noted. Psychiatric: Mood and affect are normal. Speech and behavior are normal.  Normal judgement  ____________________________________________   LABS (all labs ordered are listed, but only abnormal results are displayed)  Labs Reviewed  COMPREHENSIVE METABOLIC PANEL - Abnormal; Notable for the following components:      Result Value   Chloride 97 (*)    Glucose, Bld 109 (*)    Creatinine, Ser 1.10 (*)    Calcium 8.4 (*)    Total Protein 6.2 (*)    Albumin 3.0 (*)    GFR calc non Af Amer 49 (*)    GFR calc Af Amer 57 (*)    All other components within normal limits  CBC  TROPONIN I   ____________________________________________  EKG  ED ECG REPORT I, Anne-Caroline Mariea Clonts, the attending physician, personally viewed and interpreted this ECG.   Date: 01/25/2018  EKG Time: 1024  Rate: 92  Rhythm: normal sinus rhythm  Axis: leftward  Intervals:none  ST&T Change: No STEMI  ____________________________________________  RADIOLOGY  Dg Chest Port 1 View  Result Date: 01/25/2018 CLINICAL DATA:  Shortness of breath EXAM: PORTABLE CHEST 1 VIEW COMPARISON:  11/20/2017  FINDINGS: Moderate-sized left pleural effusion, stable. Continued left lower lobe atelectasis or infiltrate. Aneurysmal dilatation of the aortic arch, stable. Right lung clear. Right Port-A-Cath remains in place, unchanged. IMPRESSION: No significant change in the left pleural effusion and left lower lobe atelectasis or infiltrate. Stable aneurysmal dilatation of the aortic arch. Electronically Signed   By: Rolm Baptise M.D.   On: 01/25/2018 10:47    ____________________________________________   PROCEDURES  Procedure(s) performed: None  Procedures  Critical Care  performed: No ____________________________________________   INITIAL IMPRESSION / ASSESSMENT AND PLAN / ED COURSE  Pertinent labs & imaging results that were available during my care of the patient were reviewed by me and considered in my medical decision making (see chart for details).  78 y.o. female with a history of lung cancer on O2 who fell between the bed and the nightstand around 8 AM today.  The patient is not a good historian and does not remember the fall.  It is unclear to me whether there was some syncope involved.  However, her EKG does not show any ischemic changes or arrhythmia.  She has not had any chest pain or change in her shortness of breath.  A troponin is pending.  The patient's electrolytes are reassuring without any significant hypoglycemia or other abnormality.  Her CBC is also pending.  We will rule out anemia.  Her chest x-ray does not show any acute abnormality; she has no significant change in her baseline left pleural effusion and lower lobe atelectasis.  There is no clinical or radiographic suggestion of pneumonia today.  Patient has not been having urinary symptoms.  We will get a CT of her head given the face injuries.  I do not suspect a C-spine injury.  The patient will be given Tylenol for her pain I have also offered her an ice pack which she has deferred.  Plan reevaluation for final  disposition.  ----------------------------------------- 1:07 PM on 01/25/2018 -----------------------------------------  Patient CT head does not show any acute intracranial process.  I am awaiting the results of her CBC and her troponin, but if these are reassuring, we will plan to discharge the patient home.  Return precautions as well as follow-up instructions were discussed.  ----------------------------------------- 1:39 PM on 01/25/2018 -----------------------------------------  The patient's troponin is negative and her CBC shows a mild, slightly worse than baseline anemia.  I have asked the patient and she states that several weeks ago she had some black stool but has not had any over the past at least week.  I did do a rectal examination which showed nonthrombosed nonbleeding external hemorrhoids with no palpable internal hemorrhoids, brown stool that was guaiac negative.  At this time, the patient is safe for discharge home.  I have discussed follow-up instructions as well as return precautions with the patient.  ____________________________________________  FINAL CLINICAL IMPRESSION(S) / ED DIAGNOSES  Final diagnoses:  Fall from bed, initial encounter  Contusion of face, initial encounter         NEW MEDICATIONS STARTED DURING THIS VISIT:  New Prescriptions   No medications on file      Eula Listen, MD 01/25/18 1307    Eula Listen, MD 01/25/18 1340

## 2018-01-25 NOTE — ED Notes (Signed)
Hospice center contacted to obtain ride home for pt

## 2018-01-25 NOTE — ED Notes (Signed)
Called [facility] for transport to [place], [place]

## 2018-01-25 NOTE — ED Triage Notes (Signed)
Pt arrived via ems from home with fall around 8 am. Pt fell last week and has been having pain from that fall ever since. Pt ais here for SOB since this AM. Lung CA pt receiving no treatment. Pt also has COPD. Pt also has a pacemaker. Pt is A/Ox4, no distress.

## 2018-01-25 NOTE — Discharge Instructions (Signed)
The bruise on your face may get worse, and you may start to notice both color changes, and that the bruise starts to run down your face due to gravity.  This is normal.  You may decrease swelling and bruising by applying ice to your face for 10 minutes every hour for the rest of the day.  Please take all precautions to prevent falls.  Return to the emergency department if you develop severe pain, lightheadedness or fainting, vomiting, numbness tingling or weakness, new shortness of breath, or any other symptoms concerning to you.

## 2018-01-25 NOTE — Progress Notes (Signed)
ED visit made. Patient is currently followed by [facility] at home with a hospice dx of lung cancer, she is a DNR code with out of facility DNR in place in the home. Patient had a fall yesterday at home, was found by her grandson, seen this morning by her hospice nurse and felt to be more confused than baseline, patient was also complaining of a head ache. She was advised to go the the ED for evaluation. Per chart review head CT was negative for any acute fracture/injury. Patient seen lying on the ED stretcher in the hall way, alert, able to converse, anxious to return home. She reports "I fall all the time". She did appear some what confused at times, but able to state where she was and reason for coming. Plan is for return home via EMS. Hospital care team aware. Hospice care team updated and notes faxed. Patient encouraged to contact hospice with any needs once home. Thank you. Flo Shanks RN, BSN, [facility] [place], hospital liaison [phone]

## 2018-01-25 NOTE — ED Notes (Signed)
Pt's O2 changed to 6L n/c d/t sat on 4L 75%

## 2018-01-28 ENCOUNTER — Other Ambulatory Visit: Payer: Self-pay | Admitting: Urgent Care

## 2018-01-28 MED ORDER — NITROFURANTOIN MONOHYD MACRO 100 MG PO CAPS: 100.0000 mg | ORAL_CAPSULE | Freq: Two times a day (BID) | ORAL | 0 refills | Status: AC

## 2018-01-28 NOTE — Progress Notes (Signed)
Crystal with hospice informed of NP Grays orders and repeated back to me

## 2018-01-28 NOTE — Progress Notes (Signed)
Re: Review of home health labs  Date: 01/28/18  Patient name: [name] [name] [facility] obtained UA with C&S due to complaints of dysuria. Results received on 01/28/2018 at 1330 for review. UA (+). C&S (+) for ESBL producing E.coli. Limited options for treatment of ESBL in the home setting as patient desires oral interventions. Patient is likely colonized with ESBL producing organism, which unfortunately is difficult to treat as it falls under MDRO classification. Susceptibilities limited.    Plans: 1. Macrobid 100 mg BID x 7 days - Rx sent.  2. Encourage fluid intake to flush urinary system. Avoid caffeine as it can cause bladder spasms.  3. Needs to follow up with urology for recurrence. Do not recheck UA.   Honor Loh, MSN, APRN, FNP-C, CEN Oncology/Hematology Nurse Practitioner  [facility] 01/28/18, 1:36 PM

## 2018-02-04 ENCOUNTER — Encounter: Payer: Self-pay | Admitting: Hematology and Oncology

## 2018-02-23 LAB — SUSCEPTIBILITY RESULT

## 2018-03-10 ENCOUNTER — Encounter: Payer: Self-pay | Admitting: Urgent Care

## 2018-03-14 DEATH — deceased

## 2018-05-06 ENCOUNTER — Ambulatory Visit: Payer: Medicare HMO | Admitting: Radiation Oncology

## 2018-05-06 ENCOUNTER — Encounter: Payer: Self-pay | Admitting: Hematology and Oncology

## 2021-05-15 NOTE — Telephone Encounter (Signed)
Error
